# Patient Record
Sex: Female | Born: 1945 | Race: Black or African American | Hispanic: No | Smoking: Former smoker
Health system: Southern US, Community
[De-identification: ages and names within clinical notes are randomized; demographics above are authoritative.]

## PROBLEM LIST (undated history)

## (undated) DIAGNOSIS — M2550 Pain in unspecified joint: Secondary | ICD-10-CM

## (undated) DIAGNOSIS — J189 Pneumonia, unspecified organism: Secondary | ICD-10-CM

## (undated) DIAGNOSIS — IMO0001 Reserved for inherently not codable concepts without codable children: Secondary | ICD-10-CM

## (undated) DIAGNOSIS — I739 Peripheral vascular disease, unspecified: Secondary | ICD-10-CM

## (undated) DIAGNOSIS — Z531 Procedure and treatment not carried out because of patient's decision for reasons of belief and group pressure: Secondary | ICD-10-CM

## (undated) DIAGNOSIS — Z860100 Personal history of colon polyps, unspecified: Secondary | ICD-10-CM

## (undated) DIAGNOSIS — I639 Cerebral infarction, unspecified: Secondary | ICD-10-CM

## (undated) DIAGNOSIS — N186 End stage renal disease: Secondary | ICD-10-CM

## (undated) DIAGNOSIS — G629 Polyneuropathy, unspecified: Secondary | ICD-10-CM

## (undated) DIAGNOSIS — R42 Dizziness and giddiness: Secondary | ICD-10-CM

## (undated) DIAGNOSIS — E119 Type 2 diabetes mellitus without complications: Secondary | ICD-10-CM

## (undated) DIAGNOSIS — Z8739 Personal history of other diseases of the musculoskeletal system and connective tissue: Secondary | ICD-10-CM

## (undated) DIAGNOSIS — I10 Essential (primary) hypertension: Secondary | ICD-10-CM

## (undated) DIAGNOSIS — E785 Hyperlipidemia, unspecified: Secondary | ICD-10-CM

## (undated) DIAGNOSIS — M1990 Unspecified osteoarthritis, unspecified site: Secondary | ICD-10-CM

## (undated) DIAGNOSIS — R7303 Prediabetes: Secondary | ICD-10-CM

## (undated) HISTORY — DX: Cerebral infarction, unspecified: I63.9

## (undated) HISTORY — PX: APPENDECTOMY: SHX54

## (undated) HISTORY — PX: DILATION AND CURETTAGE OF UTERUS: SHX78

## (undated) HISTORY — PX: ESOPHAGOGASTRODUODENOSCOPY: SHX1529

## (undated) HISTORY — PX: COLONOSCOPY: SHX174

## (undated) HISTORY — PX: TUBAL LIGATION: SHX77

## (undated) HISTORY — PX: CYST EXCISION: SHX5701

## (undated) HISTORY — DX: Peripheral vascular disease, unspecified: I73.9

---

## 1984-07-05 HISTORY — PX: TUBAL LIGATION: SHX77

## 1985-07-05 HISTORY — PX: DILATION AND CURETTAGE OF UTERUS: SHX78

## 2001-11-07 ENCOUNTER — Emergency Department (HOSPITAL_COMMUNITY): Admission: EM | Admit: 2001-11-07 | Discharge: 2001-11-07 | Payer: Self-pay | Admitting: Emergency Medicine

## 2001-11-07 ENCOUNTER — Encounter: Payer: Self-pay | Admitting: *Deleted

## 2001-12-25 ENCOUNTER — Emergency Department (HOSPITAL_COMMUNITY): Admission: EM | Admit: 2001-12-25 | Discharge: 2001-12-25 | Payer: Self-pay | Admitting: Emergency Medicine

## 2007-01-23 ENCOUNTER — Encounter (INDEPENDENT_AMBULATORY_CARE_PROVIDER_SITE_OTHER): Payer: Self-pay | Admitting: Nurse Practitioner

## 2007-03-15 ENCOUNTER — Encounter (INDEPENDENT_AMBULATORY_CARE_PROVIDER_SITE_OTHER): Payer: Self-pay | Admitting: Nurse Practitioner

## 2007-03-15 ENCOUNTER — Ambulatory Visit: Payer: Self-pay | Admitting: Family Medicine

## 2007-03-15 DIAGNOSIS — E119 Type 2 diabetes mellitus without complications: Secondary | ICD-10-CM

## 2007-03-15 DIAGNOSIS — I10 Essential (primary) hypertension: Secondary | ICD-10-CM | POA: Insufficient documentation

## 2007-03-15 LAB — CONVERTED CEMR LAB
ALT: 9 units/L (ref 0–35)
AST: 13 units/L (ref 0–37)
Albumin: 3.6 g/dL (ref 3.5–5.2)
Alkaline Phosphatase: 97 units/L (ref 39–117)
BUN: 22 mg/dL (ref 6–23)
Basophils Absolute: 0 10*3/uL (ref 0.0–0.1)
Basophils Relative: 1 % (ref 0–1)
Bilirubin Urine: NEGATIVE
Blood Glucose, Fingerstick: 125
Blood in Urine, dipstick: NEGATIVE
CO2: 22 meq/L (ref 19–32)
Calcium: 8.5 mg/dL (ref 8.4–10.5)
Chloride: 104 meq/L (ref 96–112)
Creatinine, Ser: 1.85 mg/dL — ABNORMAL HIGH (ref 0.40–1.20)
Eosinophils Absolute: 0.4 10*3/uL (ref 0.0–0.7)
Eosinophils Relative: 5 % (ref 0–5)
Glucose, Bld: 104 mg/dL — ABNORMAL HIGH (ref 70–99)
Glucose, Urine, Semiquant: NEGATIVE
HCT: 36.7 % (ref 36.0–46.0)
Hemoglobin: 11.2 g/dL — ABNORMAL LOW (ref 12.0–15.0)
Hgb A1c MFr Bld: 5.8 %
Ketones, urine, test strip: NEGATIVE
Lymphocytes Relative: 32 % (ref 12–46)
Lymphs Abs: 2.7 10*3/uL (ref 0.7–3.3)
MCHC: 30.5 g/dL (ref 30.0–36.0)
MCV: 91.5 fL (ref 78.0–100.0)
Microalb, Ur: 191 mg/dL — ABNORMAL HIGH (ref 0.00–1.89)
Monocytes Absolute: 0.7 10*3/uL (ref 0.2–0.7)
Monocytes Relative: 8 % (ref 3–11)
Neutro Abs: 4.8 10*3/uL (ref 1.7–7.7)
Neutrophils Relative %: 55 % (ref 43–77)
Nitrite: NEGATIVE
Platelets: 356 10*3/uL (ref 150–400)
Potassium: 4.6 meq/L (ref 3.5–5.3)
Protein, U semiquant: >=300
RBC: 4.01 M/uL (ref 3.87–5.11)
RDW: 14.3 % — ABNORMAL HIGH (ref 11.5–14.0)
Sodium: 137 meq/L (ref 135–145)
Specific Gravity, Urine: 1.025
TSH: 1.266 microintl units/mL (ref 0.350–5.50)
Total Bilirubin: 0.2 mg/dL — ABNORMAL LOW (ref 0.3–1.2)
Total Protein: 8.2 g/dL (ref 6.0–8.3)
Urobilinogen, UA: 0.2
WBC: 8.6 10*3/uL (ref 4.0–10.5)
pH: 6

## 2007-04-27 ENCOUNTER — Ambulatory Visit: Payer: Self-pay | Admitting: Internal Medicine

## 2007-04-27 ENCOUNTER — Encounter (INDEPENDENT_AMBULATORY_CARE_PROVIDER_SITE_OTHER): Payer: Self-pay | Admitting: Internal Medicine

## 2007-04-27 ENCOUNTER — Encounter (INDEPENDENT_AMBULATORY_CARE_PROVIDER_SITE_OTHER): Payer: Self-pay | Admitting: Nurse Practitioner

## 2007-04-27 LAB — CONVERTED CEMR LAB
Bilirubin Urine: NEGATIVE
Blood Glucose, Fingerstick: 111
Blood in Urine, dipstick: NEGATIVE
Glucose, Urine, Semiquant: NEGATIVE
KOH Prep: NEGATIVE
Ketones, urine, test strip: NEGATIVE
Nitrite: NEGATIVE
Protein, U semiquant: NEGATIVE
Specific Gravity, Urine: 1.02
Urobilinogen, UA: 0.2
Whiff Test: NEGATIVE
pH: 6

## 2007-04-28 ENCOUNTER — Encounter (INDEPENDENT_AMBULATORY_CARE_PROVIDER_SITE_OTHER): Payer: Self-pay | Admitting: Nurse Practitioner

## 2007-05-14 ENCOUNTER — Encounter (INDEPENDENT_AMBULATORY_CARE_PROVIDER_SITE_OTHER): Payer: Self-pay | Admitting: Internal Medicine

## 2007-05-22 ENCOUNTER — Ambulatory Visit (HOSPITAL_COMMUNITY): Admission: RE | Admit: 2007-05-22 | Discharge: 2007-05-22 | Payer: Self-pay | Admitting: Endocrinology

## 2008-03-04 ENCOUNTER — Ambulatory Visit: Payer: Self-pay | Admitting: Nurse Practitioner

## 2008-03-04 DIAGNOSIS — J309 Allergic rhinitis, unspecified: Secondary | ICD-10-CM | POA: Insufficient documentation

## 2008-03-04 LAB — CONVERTED CEMR LAB
Blood Glucose, Fingerstick: 146
Hgb A1c MFr Bld: 8 %

## 2008-07-22 ENCOUNTER — Ambulatory Visit: Payer: Self-pay | Admitting: Nurse Practitioner

## 2008-07-22 LAB — CONVERTED CEMR LAB
Bilirubin Urine: NEGATIVE
Blood Glucose, Fingerstick: 115
Glucose, Urine, Semiquant: 100
Hgb A1c MFr Bld: 7.1 %
Ketones, urine, test strip: NEGATIVE
Nitrite: NEGATIVE
Protein, U semiquant: >=300
Specific Gravity, Urine: 1.015
Urobilinogen, UA: 0.2
WBC Urine, dipstick: NEGATIVE
pH: 6.5

## 2008-07-29 LAB — CONVERTED CEMR LAB
ALT: 10 units/L (ref 0–35)
AST: 12 units/L (ref 0–37)
Albumin: 2.8 g/dL — ABNORMAL LOW (ref 3.5–5.2)
Alkaline Phosphatase: 115 units/L (ref 39–117)
BUN: 37 mg/dL — ABNORMAL HIGH (ref 6–23)
Basophils Absolute: 0 10*3/uL (ref 0.0–0.1)
Basophils Relative: 1 % (ref 0–1)
CO2: 18 meq/L — ABNORMAL LOW (ref 19–32)
Calcium: 8.2 mg/dL — ABNORMAL LOW (ref 8.4–10.5)
Chloride: 110 meq/L (ref 96–112)
Cholesterol: 275 mg/dL — ABNORMAL HIGH (ref 0–200)
Creatinine, Ser: 4.04 mg/dL — ABNORMAL HIGH (ref 0.40–1.20)
Eosinophils Absolute: 0.4 10*3/uL (ref 0.0–0.7)
Eosinophils Relative: 6 % — ABNORMAL HIGH (ref 0–5)
Glucose, Bld: 101 mg/dL — ABNORMAL HIGH (ref 70–99)
HCT: 35.2 % — ABNORMAL LOW (ref 36.0–46.0)
HDL: 41 mg/dL (ref >39)
Hemoglobin: 10.2 g/dL — ABNORMAL LOW (ref 12.0–15.0)
LDL Cholesterol: 192 mg/dL — ABNORMAL HIGH (ref 0–99)
Lymphocytes Relative: 21 % (ref 12–46)
Lymphs Abs: 1.6 10*3/uL (ref 0.7–4.0)
MCHC: 29 g/dL — ABNORMAL LOW (ref 30.0–36.0)
MCV: 92.9 fL (ref 78.0–100.0)
Microalb, Ur: 397 mg/dL — ABNORMAL HIGH (ref 0.00–1.89)
Monocytes Absolute: 0.6 10*3/uL (ref 0.1–1.0)
Monocytes Relative: 8 % (ref 3–12)
Neutro Abs: 5 10*3/uL (ref 1.7–7.7)
Neutrophils Relative %: 65 % (ref 43–77)
Platelets: 367 10*3/uL (ref 150–400)
Potassium: 5.5 meq/L — ABNORMAL HIGH (ref 3.5–5.3)
RBC: 3.79 M/uL — ABNORMAL LOW (ref 3.87–5.11)
RDW: 14.6 % (ref 11.5–15.5)
Sodium: 137 meq/L (ref 135–145)
TSH: 2.203 microintl units/mL (ref 0.350–4.50)
Total Bilirubin: 0.2 mg/dL — ABNORMAL LOW (ref 0.3–1.2)
Total CHOL/HDL Ratio: 6.7
Total Protein: 6.7 g/dL (ref 6.0–8.3)
Triglycerides: 211 mg/dL — ABNORMAL HIGH (ref <150)
VLDL: 42 mg/dL — ABNORMAL HIGH (ref 0–40)
WBC: 7.6 10*3/uL (ref 4.0–10.5)

## 2008-08-07 ENCOUNTER — Ambulatory Visit: Payer: Self-pay | Admitting: Nurse Practitioner

## 2008-08-07 DIAGNOSIS — N259 Disorder resulting from impaired renal tubular function, unspecified: Secondary | ICD-10-CM | POA: Insufficient documentation

## 2008-08-07 DIAGNOSIS — E7800 Pure hypercholesterolemia, unspecified: Secondary | ICD-10-CM

## 2008-08-07 DIAGNOSIS — D649 Anemia, unspecified: Secondary | ICD-10-CM

## 2008-08-07 LAB — CONVERTED CEMR LAB
Blood Glucose, Fingerstick: 100
Cholesterol, target level: 200 mg/dL
HDL goal, serum: 40 mg/dL
LDL Goal: 100 mg/dL

## 2008-08-08 ENCOUNTER — Telehealth (INDEPENDENT_AMBULATORY_CARE_PROVIDER_SITE_OTHER): Payer: Self-pay | Admitting: Nurse Practitioner

## 2008-08-08 LAB — CONVERTED CEMR LAB
HCT: 35.4 % — ABNORMAL LOW (ref 36.0–46.0)
Hemoglobin: 10.7 g/dL — ABNORMAL LOW (ref 12.0–15.0)
MCHC: 30.2 g/dL (ref 30.0–36.0)
MCV: 92.2 fL (ref 78.0–100.0)
Platelets: 364 10*3/uL (ref 150–400)
RBC: 3.84 M/uL — ABNORMAL LOW (ref 3.87–5.11)
RDW: 14.6 % (ref 11.5–15.5)
Retic Ct Pct: 0.8 % (ref 0.4–3.1)
WBC: 9.1 10*3/uL (ref 4.0–10.5)

## 2008-08-12 ENCOUNTER — Ambulatory Visit: Payer: Self-pay | Admitting: Internal Medicine

## 2008-08-12 ENCOUNTER — Encounter (INDEPENDENT_AMBULATORY_CARE_PROVIDER_SITE_OTHER): Payer: Self-pay | Admitting: Nurse Practitioner

## 2008-08-13 LAB — CONVERTED CEMR LAB
Collection Interval-CRCL: 24 hr
Creatinine 24 HR UR: 1393 mg/24hr (ref 700–1800)
Creatinine Clearance: 25 mL/min — ABNORMAL LOW (ref 75–115)
Creatinine, Urine: 46.4 mg/dL
Protein, Ur: 10020 mg/24hr — ABNORMAL HIGH (ref 50–100)

## 2008-08-14 ENCOUNTER — Encounter (INDEPENDENT_AMBULATORY_CARE_PROVIDER_SITE_OTHER): Payer: Self-pay | Admitting: Nurse Practitioner

## 2008-08-21 ENCOUNTER — Telehealth (INDEPENDENT_AMBULATORY_CARE_PROVIDER_SITE_OTHER): Payer: Self-pay | Admitting: Nurse Practitioner

## 2008-09-04 ENCOUNTER — Encounter: Admission: RE | Admit: 2008-09-04 | Discharge: 2008-09-04 | Payer: Self-pay | Admitting: Nephrology

## 2008-09-05 ENCOUNTER — Encounter (INDEPENDENT_AMBULATORY_CARE_PROVIDER_SITE_OTHER): Payer: Self-pay | Admitting: Nurse Practitioner

## 2008-10-09 ENCOUNTER — Encounter (INDEPENDENT_AMBULATORY_CARE_PROVIDER_SITE_OTHER): Payer: Self-pay | Admitting: Nurse Practitioner

## 2008-11-11 ENCOUNTER — Encounter: Admission: RE | Admit: 2008-11-11 | Discharge: 2008-11-11 | Payer: Self-pay | Admitting: Nephrology

## 2009-02-05 ENCOUNTER — Encounter: Admission: RE | Admit: 2009-02-05 | Discharge: 2009-02-05 | Payer: Self-pay | Admitting: Psychiatry

## 2009-03-17 ENCOUNTER — Encounter (INDEPENDENT_AMBULATORY_CARE_PROVIDER_SITE_OTHER): Payer: Self-pay | Admitting: Nurse Practitioner

## 2009-04-14 ENCOUNTER — Encounter (HOSPITAL_COMMUNITY): Admission: RE | Admit: 2009-04-14 | Discharge: 2009-07-13 | Payer: Self-pay | Admitting: Nephrology

## 2009-05-07 ENCOUNTER — Ambulatory Visit: Payer: Self-pay | Admitting: Vascular Surgery

## 2009-05-22 ENCOUNTER — Ambulatory Visit (HOSPITAL_COMMUNITY): Admission: RE | Admit: 2009-05-22 | Discharge: 2009-05-22 | Payer: Self-pay | Admitting: Vascular Surgery

## 2009-05-22 ENCOUNTER — Ambulatory Visit: Payer: Self-pay | Admitting: Vascular Surgery

## 2009-06-20 ENCOUNTER — Encounter (INDEPENDENT_AMBULATORY_CARE_PROVIDER_SITE_OTHER): Payer: Self-pay | Admitting: Nurse Practitioner

## 2009-06-20 ENCOUNTER — Ambulatory Visit: Payer: Self-pay | Admitting: Vascular Surgery

## 2009-07-14 ENCOUNTER — Encounter (HOSPITAL_COMMUNITY): Admission: RE | Admit: 2009-07-14 | Discharge: 2009-10-12 | Payer: Self-pay | Admitting: Nephrology

## 2009-10-15 ENCOUNTER — Encounter (HOSPITAL_COMMUNITY): Admission: RE | Admit: 2009-10-15 | Discharge: 2010-01-13 | Payer: Self-pay | Admitting: Nephrology

## 2009-10-27 ENCOUNTER — Ambulatory Visit: Payer: Self-pay | Admitting: Surgery

## 2009-12-11 ENCOUNTER — Encounter (INDEPENDENT_AMBULATORY_CARE_PROVIDER_SITE_OTHER): Payer: Self-pay | Admitting: Nurse Practitioner

## 2009-12-23 ENCOUNTER — Ambulatory Visit (HOSPITAL_COMMUNITY): Admission: RE | Admit: 2009-12-23 | Discharge: 2009-12-23 | Payer: Self-pay | Admitting: Surgery

## 2009-12-23 ENCOUNTER — Ambulatory Visit: Payer: Self-pay | Admitting: Surgery

## 2010-01-12 ENCOUNTER — Ambulatory Visit: Payer: Self-pay | Admitting: Surgery

## 2010-01-21 ENCOUNTER — Encounter (HOSPITAL_COMMUNITY): Admission: RE | Admit: 2010-01-21 | Discharge: 2010-04-21 | Payer: Self-pay | Admitting: Nephrology

## 2010-02-03 ENCOUNTER — Ambulatory Visit: Payer: Self-pay | Admitting: Vascular Surgery

## 2010-02-03 ENCOUNTER — Ambulatory Visit (HOSPITAL_COMMUNITY): Admission: RE | Admit: 2010-02-03 | Discharge: 2010-02-03 | Payer: Self-pay | Admitting: Surgery

## 2010-03-10 ENCOUNTER — Ambulatory Visit: Payer: Self-pay | Admitting: Vascular Surgery

## 2010-04-24 ENCOUNTER — Encounter (HOSPITAL_COMMUNITY)
Admission: RE | Admit: 2010-04-24 | Discharge: 2010-07-23 | Payer: Self-pay | Source: Home / Self Care | Attending: Nephrology | Admitting: Nephrology

## 2010-07-20 LAB — IRON AND TIBC
Iron: 75 ug/dL (ref 42–135)
Saturation Ratios: 32 % (ref 20–55)
TIBC: 238 ug/dL — ABNORMAL LOW (ref 250–470)
UIBC: 163 ug/dL

## 2010-07-20 LAB — POCT HEMOGLOBIN-HEMACUE: Hemoglobin: 10.2 g/dL — ABNORMAL LOW (ref 12.0–15.0)

## 2010-07-20 LAB — FERRITIN: Ferritin: 380 ng/mL — ABNORMAL HIGH (ref 10–291)

## 2010-07-26 ENCOUNTER — Encounter: Payer: Self-pay | Admitting: Nephrology

## 2010-07-26 ENCOUNTER — Encounter: Payer: Self-pay | Admitting: Vascular Surgery

## 2010-07-27 ENCOUNTER — Encounter: Payer: Self-pay | Admitting: Nephrology

## 2010-07-31 ENCOUNTER — Encounter (HOSPITAL_COMMUNITY)
Admission: RE | Admit: 2010-07-31 | Discharge: 2010-08-04 | Payer: Self-pay | Source: Home / Self Care | Attending: Nephrology | Admitting: Nephrology

## 2010-08-02 LAB — CONVERTED CEMR LAB
Chlamydia, DNA Probe: NEGATIVE
Cholesterol: 217 mg/dL — ABNORMAL HIGH (ref 0–200)
GC Probe Amp, Genital: NEGATIVE
HDL: 33 mg/dL — ABNORMAL LOW (ref >39)
LDL Cholesterol: 138 mg/dL — ABNORMAL HIGH (ref 0–99)
Total CHOL/HDL Ratio: 6.6
Triglycerides: 231 mg/dL — ABNORMAL HIGH (ref <150)
VLDL: 46 mg/dL — ABNORMAL HIGH (ref 0–40)

## 2010-08-03 LAB — POCT HEMOGLOBIN-HEMACUE: Hemoglobin: 11 g/dL — ABNORMAL LOW (ref 12.0–15.0)

## 2010-08-06 NOTE — Letter (Signed)
Summary: [facility]  [facility]   Imported By: Roland Earl 01/14/2010 10:32:04  _____________________________________________________________________  External Attachment:    Type:   Image     Comment:   External Document

## 2010-08-06 NOTE — Letter (Signed)
Summary: [facility]  [facility]   Imported By: Roland Earl 08/13/2009 15:43:28  _____________________________________________________________________  External Attachment:    Type:   Image     Comment:   External Document

## 2010-08-10 ENCOUNTER — Encounter (INDEPENDENT_AMBULATORY_CARE_PROVIDER_SITE_OTHER): Payer: Self-pay | Admitting: Nurse Practitioner

## 2010-08-14 ENCOUNTER — Other Ambulatory Visit: Payer: Self-pay | Admitting: Nephrology

## 2010-08-14 ENCOUNTER — Other Ambulatory Visit: Payer: Self-pay

## 2010-08-14 ENCOUNTER — Encounter (HOSPITAL_COMMUNITY): Payer: BC Managed Care – PPO | Attending: Nephrology

## 2010-08-14 DIAGNOSIS — N184 Chronic kidney disease, stage 4 (severe): Secondary | ICD-10-CM | POA: Insufficient documentation

## 2010-08-14 DIAGNOSIS — D638 Anemia in other chronic diseases classified elsewhere: Secondary | ICD-10-CM | POA: Insufficient documentation

## 2010-08-14 LAB — IRON AND TIBC
Iron: 72 ug/dL (ref 42–135)
Saturation Ratios: 29 % (ref 20–55)
TIBC: 251 ug/dL (ref 250–470)
UIBC: 179 ug/dL

## 2010-08-14 LAB — POCT HEMOGLOBIN-HEMACUE: Hemoglobin: 11.6 g/dL — ABNORMAL LOW (ref 12.0–15.0)

## 2010-08-14 LAB — FERRITIN: Ferritin: 276 ng/mL (ref 10–291)

## 2010-08-19 ENCOUNTER — Other Ambulatory Visit (HOSPITAL_COMMUNITY): Payer: Self-pay | Admitting: Nephrology

## 2010-08-19 DIAGNOSIS — Z1231 Encounter for screening mammogram for malignant neoplasm of breast: Secondary | ICD-10-CM

## 2010-08-26 ENCOUNTER — Ambulatory Visit (HOSPITAL_COMMUNITY)
Admission: RE | Admit: 2010-08-26 | Discharge: 2010-08-26 | Disposition: A | Discharge status: Home / Self Care | Payer: BC Managed Care – PPO | Source: Ambulatory Visit | Attending: Nephrology | Admitting: Nephrology

## 2010-08-26 DIAGNOSIS — Z1231 Encounter for screening mammogram for malignant neoplasm of breast: Secondary | ICD-10-CM | POA: Insufficient documentation

## 2010-08-26 NOTE — Letter (Signed)
Summary: [facility]  [facility]   Imported By: Roland Earl 08/19/2010 13:59:33  _____________________________________________________________________  External Attachment:    Type:   Image     Comment:   External Document

## 2010-08-28 ENCOUNTER — Other Ambulatory Visit: Payer: Self-pay | Admitting: Nephrology

## 2010-08-28 DIAGNOSIS — R928 Other abnormal and inconclusive findings on diagnostic imaging of breast: Secondary | ICD-10-CM

## 2010-08-31 ENCOUNTER — Encounter (HOSPITAL_COMMUNITY): Payer: BC Managed Care – PPO

## 2010-09-07 ENCOUNTER — Encounter (HOSPITAL_COMMUNITY): Payer: BC Managed Care – PPO

## 2010-09-08 ENCOUNTER — Other Ambulatory Visit: Payer: Self-pay | Admitting: Gastroenterology

## 2010-09-14 ENCOUNTER — Ambulatory Visit
Admission: RE | Admit: 2010-09-14 | Discharge: 2010-09-14 | Disposition: A | Discharge status: Home / Self Care | Payer: BC Managed Care – PPO | Source: Ambulatory Visit | Attending: Nephrology | Admitting: Nephrology

## 2010-09-14 ENCOUNTER — Ambulatory Visit
Admission: RE | Admit: 2010-09-14 | Discharge: 2010-09-14 | Disposition: A | Discharge status: Home / Self Care | Payer: BC Managed Care – PPO | Source: Ambulatory Visit

## 2010-09-14 ENCOUNTER — Other Ambulatory Visit: Payer: Self-pay | Admitting: Nephrology

## 2010-09-14 DIAGNOSIS — N63 Unspecified lump in unspecified breast: Secondary | ICD-10-CM

## 2010-09-14 DIAGNOSIS — R928 Other abnormal and inconclusive findings on diagnostic imaging of breast: Secondary | ICD-10-CM

## 2010-09-14 DIAGNOSIS — N611 Abscess of the breast and nipple: Secondary | ICD-10-CM

## 2010-09-14 LAB — POCT HEMOGLOBIN-HEMACUE: Hemoglobin: 9.6 g/dL — ABNORMAL LOW (ref 12.0–15.0)

## 2010-09-15 LAB — IRON AND TIBC: UIBC: 157 ug/dL

## 2010-09-15 LAB — POCT HEMOGLOBIN-HEMACUE: Hemoglobin: 10.1 g/dL — ABNORMAL LOW (ref 12.0–15.0)

## 2010-09-16 ENCOUNTER — Other Ambulatory Visit: Payer: Self-pay | Admitting: Nephrology

## 2010-09-16 DIAGNOSIS — N63 Unspecified lump in unspecified breast: Secondary | ICD-10-CM

## 2010-09-16 LAB — IRON AND TIBC
Iron: 55 ug/dL (ref 42–135)
UIBC: 185 ug/dL

## 2010-09-16 LAB — POCT HEMOGLOBIN-HEMACUE: Hemoglobin: 9.8 g/dL — ABNORMAL LOW (ref 12.0–15.0)

## 2010-09-18 LAB — POCT HEMOGLOBIN-HEMACUE: Hemoglobin: 11.9 g/dL — ABNORMAL LOW (ref 12.0–15.0)

## 2010-09-18 LAB — POCT I-STAT 4, (NA,K, GLUC, HGB,HCT)
Hemoglobin: 14.3 g/dL (ref 12.0–15.0)
Sodium: 139 mEq/L (ref 135–145)

## 2010-09-18 LAB — FERRITIN: Ferritin: 441 ng/mL — ABNORMAL HIGH (ref 10–291)

## 2010-09-18 LAB — GLUCOSE, CAPILLARY: Glucose-Capillary: 115 mg/dL — ABNORMAL HIGH (ref 70–99)

## 2010-09-18 LAB — IRON AND TIBC: TIBC: 247 ug/dL — ABNORMAL LOW (ref 250–470)

## 2010-09-19 LAB — IRON AND TIBC
Saturation Ratios: 23 % (ref 20–55)
TIBC: 251 ug/dL (ref 250–470)

## 2010-09-20 LAB — IRON AND TIBC
Iron: 78 ug/dL (ref 42–135)
Saturation Ratios: 29 % (ref 20–55)
TIBC: 235 ug/dL — ABNORMAL LOW (ref 250–470)
UIBC: 181 ug/dL

## 2010-09-20 LAB — POCT I-STAT, CHEM 8
Calcium, Ion: 1.11 mmol/L — ABNORMAL LOW (ref 1.12–1.32)
Creatinine, Ser: 5.4 mg/dL — ABNORMAL HIGH (ref 0.4–1.2)
Glucose, Bld: 157 mg/dL — ABNORMAL HIGH (ref 70–99)
Hemoglobin: 11.6 g/dL — ABNORMAL LOW (ref 12.0–15.0)
Potassium: 4.5 mEq/L (ref 3.5–5.1)

## 2010-09-20 LAB — POCT HEMOGLOBIN-HEMACUE: Hemoglobin: 11.3 g/dL — ABNORMAL LOW (ref 12.0–15.0)

## 2010-09-20 LAB — FERRITIN: Ferritin: 580 ng/mL — ABNORMAL HIGH (ref 10–291)

## 2010-09-21 ENCOUNTER — Other Ambulatory Visit: Payer: Self-pay | Admitting: Nephrology

## 2010-09-21 ENCOUNTER — Encounter (HOSPITAL_COMMUNITY): Payer: BC Managed Care – PPO | Attending: Nephrology

## 2010-09-21 ENCOUNTER — Other Ambulatory Visit: Payer: Self-pay

## 2010-09-21 DIAGNOSIS — N184 Chronic kidney disease, stage 4 (severe): Secondary | ICD-10-CM | POA: Insufficient documentation

## 2010-09-21 DIAGNOSIS — D638 Anemia in other chronic diseases classified elsewhere: Secondary | ICD-10-CM | POA: Insufficient documentation

## 2010-09-21 LAB — IRON AND TIBC
Iron: 96 ug/dL (ref 42–135)
TIBC: 244 ug/dL — ABNORMAL LOW (ref 250–470)
UIBC: 143 ug/dL

## 2010-09-21 LAB — POCT HEMOGLOBIN-HEMACUE: Hemoglobin: 11.4 g/dL — ABNORMAL LOW (ref 12.0–15.0)

## 2010-09-22 LAB — POCT HEMOGLOBIN-HEMACUE: Hemoglobin: 11.1 g/dL — ABNORMAL LOW (ref 12.0–15.0)

## 2010-09-23 ENCOUNTER — Encounter: Payer: BC Managed Care – PPO | Admitting: Physician Assistant

## 2010-09-23 ENCOUNTER — Other Ambulatory Visit: Payer: Self-pay | Admitting: Physician Assistant

## 2010-09-23 ENCOUNTER — Other Ambulatory Visit (HOSPITAL_COMMUNITY)
Admission: RE | Admit: 2010-09-23 | Discharge: 2010-09-23 | Disposition: A | Discharge status: Home / Self Care | Payer: BC Managed Care – PPO | Source: Ambulatory Visit | Attending: Physician Assistant | Admitting: Physician Assistant

## 2010-09-23 DIAGNOSIS — Z01419 Encounter for gynecological examination (general) (routine) without abnormal findings: Secondary | ICD-10-CM

## 2010-09-23 DIAGNOSIS — Z124 Encounter for screening for malignant neoplasm of cervix: Secondary | ICD-10-CM | POA: Insufficient documentation

## 2010-09-23 LAB — POCT HEMOGLOBIN-HEMACUE
Hemoglobin: 11 g/dL — ABNORMAL LOW (ref 12.0–15.0)
Hemoglobin: 12.5 g/dL (ref 12.0–15.0)

## 2010-09-23 LAB — IRON AND TIBC
Saturation Ratios: 17 % — ABNORMAL LOW (ref 20–55)
TIBC: 253 ug/dL (ref 250–470)
TIBC: 247 ug/dL — ABNORMAL LOW (ref 250–470)

## 2010-09-25 NOTE — Progress Notes (Signed)
Joanne Carlson, Joanne Carlson              ACCOUNT NO.:  [id number]  MEDICAL RECORD NO.:  [id number]           PATIENT TYPE:  A  LOCATION:  [facility]                   FACILITY:  [facility]  PHYSICIAN:  Malcolm Metro, CNM    DATE OF BIRTH:  May 31, 1946  DATE OF SERVICE:  09/23/2010                                 CLINIC NOTE  The patient is being seen in [facility] [place] VISIT:  Pap smear without complaints.  HISTORY OF PRESENT ILLNESS:  The patient's problem list include; 1. Kidney disease, awaiting transplant 2. Hypertension, on medications. 3. Diabetes, managed with glipizide. 4. Hyperlipidemia.  The patient is currently awaiting kidney transplant.  Her care is being managed by the [facility] in [place].  She presents today for a Pap smear secondary to recommendation of [facility] as preop screening.  MEDICAL HISTORY:  She has no known drug allergies.  CURRENT MEDICATIONS:  Furosemide, metoprolol, Pravastatin, methocarbamol, glipizide, PhosLo 667.  The patient's last mammogram was on February 22, at which time there was an abnormal left subareolar mass.  A digital diagnostic mammogram and left breast ultrasound was performed on March 12 which found a 1 cm complicated cyst, subsequently ultrasound-guided aspiration was performed and the size of the abscess decreased in size postaspiration. The patient was started on Keflex which she continues to take today. BIRADS category 3, probable benign findings and she is to follow up next week with the breast center.  The patient is postmenopausal.  Her last period was in 1986.  CONTRACEPTIVE HISTORY:  She had a tubal ligation in 1996.  OBSTETRICAL HISTORY:  She is a gravida 3, para 2-0-1-2, they were vaginal deliveries without complication.  GYNECOLOGIC HISTORY:  Her last Pap smear was in 2009, at [facility] and it was found to be normal.  STD history is negative.  Personal  medical history is positive for as previously discussed kidney disease, hyperlipidemia, hypertension, diabetes.  Surgical history includes appendectomy more than 40 years ago, a cystectomy of her right ovary more than 20 years ago and __________  in 1986.  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  She denies any illicit drug use, smoking or alcohol.  FAMILY HISTORY:  Positive for diabetes in her mother, father and sister.  REVIEW OF SYSTEMS:  Systemic review is negative x10 other than what has been reviewed in HPI.  PHYSICAL EXAMINATION:  GENERAL:  Joanne Carlson is a pleasant 65 year old African American female in no apparent distress. HEENT:  Grossly normal. BREASTS:  Symmetrical, soft and nontender.  No retracting dimpling of the skin. VITAL SIGNS:  Temperature 97.3, 85 pulse, blood pressure is 168/78, recheck is 159/71, her weight today is 220 pounds and her height is 64 inches. ABDOMEN:  Obese and soft, nontender.  No hepatosplenomegaly.  She does have a vertical scar noted at the umbilicus. PELVIC:  Genitalia, she is a tanner V.  External genitalia are without lesions.  Mucous membranes are pink without lesions, hypertrophic tissues is nonfriable.  She has a lax tone.  No evidence of cystocele or rectocele.  Parous cervix is easily visualized with a Graves speculum. Cervix is nonfriable, smooth without lesions.  Bimanual exam is limited by patient habitus.  However, no enlargement or tenderness are appreciated of the uterus or adnexa bilaterally during examination. RECTAL:  Deferred. EXTREMITIES:  Equal in strength.  Lower extremities have equal pedal pulses.  No evidence of edema.  ASSESSMENT: 1. Well-woman exam with Pap smear. 2. Hypertension. 3. Diabetes. 4. Hyperlipidemia. 5. Kidney disease. 6. Recent abnormal mammogram with breast abscess being treated by     breast center.  PLAN: 1. Pap smear is obtained and sent per  routine to pathology.  The     patient will be informed by letter of normal Pap smear given the     patient's age of 65 and a history of no known abnormal Pap smears.     This will be her last needed Pap smear unless today is abnormal. 2. Health care maintenance.  The patient desires to receive her     hepatitis B series in preparation for a kidney disease and that has     been initiated today. 3. The patient is instructed to follow up with [facility] and     continue all medications as prescribed by them for her     hypertension, diabetes, hyperlipidemia and kidney disease. 4. The patient should follow up with the breast center as directed     next week for the results of her needle aspiration biopsy and     continue her full course of Keflex.  The patient should follow up     for annual exam in 1 year or p.r.n. problems.          ______________________________ Malcolm Metro, CNM    SS/MEDQ  D:  09/23/2010  T:  09/24/2010  Job:  [id number]

## 2010-09-28 ENCOUNTER — Other Ambulatory Visit: Payer: BC Managed Care – PPO

## 2010-09-28 LAB — IRON AND TIBC
Iron: 77 ug/dL (ref 42–135)
Saturation Ratios: 30 % (ref 20–55)
UIBC: 183 ug/dL

## 2010-09-30 ENCOUNTER — Encounter (HOSPITAL_COMMUNITY): Payer: BC Managed Care – PPO

## 2010-10-06 LAB — FERRITIN: Ferritin: 113 ng/mL (ref 10–291)

## 2010-10-06 LAB — POCT HEMOGLOBIN-HEMACUE: Hemoglobin: 10 g/dL — ABNORMAL LOW (ref 12.0–15.0)

## 2010-10-06 LAB — IRON AND TIBC
Saturation Ratios: 18 % — ABNORMAL LOW (ref 20–55)
TIBC: 271 ug/dL (ref 250–470)
UIBC: 222 ug/dL

## 2010-10-07 LAB — IRON AND TIBC
Saturation Ratios: 23 % (ref 20–55)
TIBC: 261 ug/dL (ref 250–470)

## 2010-10-07 LAB — GLUCOSE, CAPILLARY
Glucose-Capillary: 49 mg/dL — ABNORMAL LOW (ref 70–99)
Glucose-Capillary: 65 mg/dL — ABNORMAL LOW (ref 70–99)

## 2010-10-07 LAB — FERRITIN: Ferritin: 140 ng/mL (ref 10–291)

## 2010-10-07 LAB — POCT HEMOGLOBIN-HEMACUE: Hemoglobin: 10 g/dL — ABNORMAL LOW (ref 12.0–15.0)

## 2010-10-07 LAB — POCT I-STAT 4, (NA,K, GLUC, HGB,HCT)
HCT: 37 % (ref 36.0–46.0)
Hemoglobin: 12.6 g/dL (ref 12.0–15.0)

## 2010-10-12 ENCOUNTER — Encounter (HOSPITAL_COMMUNITY): Payer: BC Managed Care – PPO

## 2010-10-23 ENCOUNTER — Ambulatory Visit: Payer: BC Managed Care – PPO

## 2010-10-26 ENCOUNTER — Ambulatory Visit: Payer: BC Managed Care – PPO

## 2010-10-26 DIAGNOSIS — Z23 Encounter for immunization: Secondary | ICD-10-CM

## 2010-10-27 ENCOUNTER — Ambulatory Visit (INDEPENDENT_AMBULATORY_CARE_PROVIDER_SITE_OTHER): Payer: BC Managed Care – PPO | Admitting: Vascular Surgery

## 2010-10-27 DIAGNOSIS — N186 End stage renal disease: Secondary | ICD-10-CM

## 2010-10-28 NOTE — Assessment & Plan Note (Signed)
OFFICE VISIT  Joanne Carlson, Joanne Carlson DOB:  [birth year]                                       10/27/2010 [id number]  Patient presents today for followup of her left arm basilic vein transposition, and this was done by myself as a single-stage operation on 02/03/10.  She has had very nice maturation of her basilic vein transposition.  She reports that she is on the transplant list and is being evaluated at [facility].  She has no pain associated with her fistula.  PHYSICAL EXAMINATION:  On physical exam, blood pressure today is 132/65, pulse 76, respirations 18.  Her fistula has an excellent thrill throughout its course.  She does on compression in the axillae have very good size throughout.  I imaged this with the SonoSite, and she does not have any evidence of any stricture or severe stenosis.  The fistula is large at the antecubital space.  It is __________ smaller segment above the antecubital space and is quite large again.  I feel that this would be available at any time on imaging.  It does look to be relatively thin- walled and is typical of basilic veins, which may have trouble with infiltration, which is also typical with basilic veins.  She has been 9 months out, so I feel that if she needs hemodialysis access, I should be available at any time.    Rosetta Posner, M.D. Electronically Signed  TFE/MEDQ  D:  10/27/2010  T:  10/28/2010  Job:  [id number]  cc:   Jeneen Rinks L. Deterding, M.D.

## 2010-11-17 NOTE — Assessment & Plan Note (Signed)
OFFICE VISIT   Joanne Carlson, Joanne Carlson  DOB:  10/02/1945                                       10/27/2009  [id number]   REASON FOR VISIT:  Evaluate nonfunctioning fistula.   HISTORY:  This is Joanne Carlson 65 year old female who is status post left  radiocephalic fistula placed by Dr. Donnetta Hutching on May 22, 2009.  She  comes back today because this has not matured.  She is not yet on  dialysis.  The patient's renal failure is secondary to hypertension and  diabetes, both of which are medically managed with no acute changes.   REVIEW OF SYSTEMS:  CARDIAC:  Positive for heart murmur.  GU:  Positive for renal disease.  VASCULAR:  Positive for pain in her knee with walking.  NEUROLOGIC:  Positive for dizziness and headaches.  EENT:  Positive for nosebleeds.  All other review of systems is negative as documented in the encounter  form.   SOCIAL HISTORY:  Joanne Carlson has 2 children.  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  Quit smoking in 1975.  Does not drink.   PHYSICAL EXAMINATION:  Heart rate 79, blood pressure 116/65 temperature  is 98.0.  in general, she is well-appearing, in no distress.  HEENT:  Within normal limits.  Lungs are clear bilaterally.  Cardiovascular is Joanne Carlson  regular rate and rhythm.  No murmur.  No carotid bruits.  Abdomen is  obese.  Musculoskeletal:  Without major deformity.  Neuro:  She has no  focal weakness.  Skin:  Without rash.  Left arm:  There is Joanne Carlson minimal  thrill within the fistula.  I cannot palpate Joanne Carlson radial pulse.  She does  have Joanne Carlson palpable brachial pulse.  Motor and sensory function of the left  hand are intact.   DIAGNOSTIC STUDIES:  Vein mapping was independently reviewed.  This  reveals abnormally low flow within the left radial artery, as low as 13  cm/sec.  The fistula is patent but very small in the distal forearm.   ASSESSMENT/PLAN:  Non-maturing left arm fistula.   PLAN:  Based on the ultrasound findings, I am concerned there could be  arterial inflow problem within the radial artery.  I think this best  needs to be evaluated for with an arteriogram to see if placing Joanne Carlson  brachiocephalic fistula would put her at higher risk for steal syndrome  and see how well her hand is perfused.  Also, if they is what looks like  more of an anatomic problem, I would image her right arm to make sure  this same problem does not happen on the right side.  The existing  fistula most likely will not survive and may need to be converted to an  upper arm fistula.  The timing of this will be based on findings of her  arteriogram, which has been scheduled for Tuesday, the 3rd.     Eldridge Abrahams, MD  Electronically Signed   VWB/MEDQ  D:  10/27/2009  T:  10/28/2009  Job:  [id number]   cc:   Dr. Jimmy Footman

## 2010-11-17 NOTE — Procedures (Signed)
CEPHALIC VEIN MAPPING   INDICATION:  AV fistula placement.   HISTORY:  Renal failure, hypertension, diabetes.   EXAM:  The right cephalic vein is compressible.   Diameter measurements range from 0.23 to 0.35 cm.   The left cephalic vein is compressible.   Diameter measurements range from 0.23 to 0.38 cm.   See attached worksheet for all measurements.   IMPRESSION:  Patent has bilateral cephalic veins which are of acceptable  diameter for use as dialysis access sites.   ___________________________________________  Jessy Oto Fields, MD   CB/MEDQ  D:  05/07/2009  T:  05/07/2009  Job:  [id number]

## 2010-11-17 NOTE — Assessment & Plan Note (Signed)
OFFICE VISIT   KESIAH, SPITZLEY  DOB:  05/19/1946                                       01/12/2010  [id number]   REASON FOR VISIT:  Follow up nonmaturing fistula.   HISTORY:  This is a 65 year old female who underwent radiocephalic  fistula on the left by Dr. Donnetta Hutching in November 2010.  This has not  matured.  Ultrasound revealed significantly decreased pressure within  the left radial artery.  I took her for arteriogram.  She had an  occluded left radial artery.  I did not see any other stenosis within  her axillary or brachial arteries.   PHYSICAL EXAMINATION:  Heart rate is 79, blood pressure 118/69,  temperature is 97.8.  General:  She is well-appearing in no distress.  Lungs are clear bilaterally.  Cardiovascular:  Regular rate and rhythm.  Left arm reveals a palpable left brachial pulse.  There is minimal  thrill within her fistula.   ASSESSMENT/PLAN:  Nonmaturing left arm fistula.   PLAN:  I have discussed proceeding with left upper arm fistula.  We  discussed the risk of steal syndrome.  I think that this,  however,  should provide adequate access for her.  Her operation has been  scheduled for Tuesday August 2, as the patient finishes working on July  30th and wants to wait until she is done.     Eldridge Abrahams, MD  Electronically Signed   VWB/MEDQ  D:  01/12/2010  T:  01/13/2010  Job:  [id number]   cc:   Jeneen Rinks L. Deterding, M.D.

## 2010-11-17 NOTE — Assessment & Plan Note (Signed)
OFFICE VISIT   [name], [name]  DOB:  1945-07-29                                       03/10/2010  [id number]   Joanne Carlson presents today for follow-up of basilic vein  transposition on February 03, 2010 at [facility] left upper arm.  She has a very nice maturation at 1 month.  She has excellent size, and  excellent superficial tunneling of this fistula.  She is not on dialysis  currently.  She will continue her usual activities.  She is allowed to  return to work without any heavy lifting, and we will see her again on  an as-needed basis.  She will continue her follow-up with Dr. Jimmy Footman.     Rosetta Posner, M.D.  Electronically Signed   TFE/MEDQ  D:  03/10/2010  T:  03/11/2010  Job:  [id number]   cc:   Jeneen Rinks L. Deterding, M.D.

## 2010-11-17 NOTE — Assessment & Plan Note (Signed)
OFFICE VISIT   Joanne Carlson, Joanne Carlson  DOB:  06/14/1946                                       06/20/2009  [id number]   ATTENDING PHYSICIAN:  Rosetta Posner, M.D.  Date of surgery was  05/22/2009.   CHIEF COMPLAINT:  Status post left Cimino fistula.   HISTORY OF PRESENT ILLNESS:  The patient is a 65 year old female with a  history of hypertension and diabetes who had a left arm Cimino fistula  placed in anticipation of hemodialysis.  She is not presently on  dialysis.  The fistula was placed on 05/22/2009.  She is healing well.  She are no complaints of steal.  There is no pain in the hand.  She can  move it well.  She can use her hand normally.  Her hand has been warm  and pink without complaint.   REVIEW OF SYSTEMS:  Her hypertension and diabetes are well-controlled  with pain medication.   PHYSICAL EXAM:  Vital signs:  Heart rate is 82, O2 sats are 96,  temperature was 98.6 and respiratory rate is 12.  Left upper extremity:  Hand is warm and pink.  She had good sensation and motion of the left  hand.  She can make a full fist.  She did have equal strength  bilaterally in her upper extremities and good grip.  She had 2+ radial  and ulnar pulses beyond the fistula incision.  She had a good thrill and  bruit up to the antecubital space on the left.  The thrill went up to  about mid forearm.   IMPRESSION:  A patient with end-stage renal disease, hypertension and  diabetes but is not on hemodialysis as of yet.  She is status post a  left arm Cimino fistula with no signs of steal and good function of the  fistula.   PLAN:  1. The patient will follow up with her nephrologist for lab work.  The      fistula should be ready to use in 2 more months.  2. The patient will be doing some exercises of her hand to build up      her fistula and she will call if there are any issues with      drainage, redness, swelling, pain, numbness or tingling in her  hand.   Wray Kearns, PA-C   Rosetta Posner, M.D.  Electronically Signed   RR/MEDQ  D:  06/20/2009  T:  06/21/2009  Job:  [id number]

## 2010-11-17 NOTE — Assessment & Plan Note (Signed)
OFFICE VISIT   VERANDA, VEDDER  DOB:  Apr 28, 1946                                       05/07/2009  [id number]   The patient is a 65 year old female referred today for placement of a  hemodialysis access.  She is right-handed.  She is currently not on  dialysis.  Chronic medical problems include diabetes and hypertension  which are thought to be the cause of her renal failure.  She also has a  history of anemia, hyperlipidemia, hyperparathyroidism, obesity and  chronic back pain.  She has had no prior hemodialysis access procedures.   PAST MEDICAL HISTORY:  Is as mentioned above.   FAMILY HISTORY:  Unremarkable.   SOCIAL HISTORY:  She is married, has 2 children.  Currently works as a  Scientist, water quality.  She is a former smoker, quit in 1978.  She does not consume  alcohol regularly.   REVIEW OF SYSTEMS:  Full review of systems was performed today.  This  was remarkable primarily for her kidney disease.  Please see intake  referral form for full details.   PHYSICAL EXAM:  Vital signs:  Blood pressure is 116/74 in the left arm,  temperature is 98.2, heart rate 72 and regular.  HEENT:  Unremarkable.  General:  She is alert and oriented x3.  Neck:  Has no carotid bruits.  Chest:  Clear to auscultation.  Cardiac:  Exam is regular rate and  rhythm.  Abdomen:  Soft, obese, nontender, nondistended.  No masses.  Extremities:  She has 2+ brachial and radial pulses bilaterally.  Neurological:  Shows symmetric upper extremity and lower extremity motor  strength which is 5/5.  Skin:  Has no rashes or lesions.  Placement of a  tourniquet on the left arm shows a vaguely palpable cephalic vein at the  wrist.  Vein mapping ultrasound was performed today.  The vein on the  left arm was between 30 and 41 mm in the forearm, between 23 and 33 mm  in the left upper arm.   I believe that the best option for the patient would be placement of a  left radiocephalic AV fistula.   Risks, benefits, possible complications  and procedure details including but not limited to bleeding, infection,  nonmaturation of the fistula were explained to the patient today.  She  understands and agrees to proceed.  This will be scheduled for her in  the near future.   Jessy Oto. Fields, MD  Electronically Signed   CEF/MEDQ  D:  05/08/2009  T:  05/08/2009  Job:  [id number]   cc:   Jeneen Rinks L. Deterding, M.D.

## 2010-12-18 ENCOUNTER — Other Ambulatory Visit: Payer: Self-pay | Admitting: Nephrology

## 2010-12-18 ENCOUNTER — Encounter (HOSPITAL_COMMUNITY)
Admission: RE | Admit: 2010-12-18 | Discharge: 2010-12-18 | Disposition: A | Discharge status: Home / Self Care | Payer: BC Managed Care – PPO | Source: Ambulatory Visit | Attending: Nephrology | Admitting: Nephrology

## 2010-12-18 DIAGNOSIS — N184 Chronic kidney disease, stage 4 (severe): Secondary | ICD-10-CM | POA: Insufficient documentation

## 2010-12-18 DIAGNOSIS — D638 Anemia in other chronic diseases classified elsewhere: Secondary | ICD-10-CM | POA: Insufficient documentation

## 2010-12-21 ENCOUNTER — Ambulatory Visit
Admission: RE | Admit: 2010-12-21 | Discharge: 2010-12-21 | Disposition: A | Discharge status: Home / Self Care | Payer: BC Managed Care – PPO | Source: Ambulatory Visit | Attending: Nephrology | Admitting: Nephrology

## 2010-12-21 DIAGNOSIS — N611 Abscess of the breast and nipple: Secondary | ICD-10-CM

## 2011-01-04 ENCOUNTER — Encounter (HOSPITAL_COMMUNITY): Payer: BC Managed Care – PPO

## 2011-01-08 ENCOUNTER — Encounter (HOSPITAL_COMMUNITY): Payer: BC Managed Care – PPO

## 2011-01-11 ENCOUNTER — Other Ambulatory Visit: Payer: Self-pay | Admitting: Nephrology

## 2011-01-11 ENCOUNTER — Encounter (HOSPITAL_COMMUNITY): Payer: BC Managed Care – PPO | Attending: Nephrology

## 2011-01-11 DIAGNOSIS — N184 Chronic kidney disease, stage 4 (severe): Secondary | ICD-10-CM | POA: Insufficient documentation

## 2011-01-11 DIAGNOSIS — D638 Anemia in other chronic diseases classified elsewhere: Secondary | ICD-10-CM | POA: Insufficient documentation

## 2011-01-11 LAB — FERRITIN: Ferritin: 468 ng/mL — ABNORMAL HIGH (ref 10–291)

## 2011-01-11 LAB — IRON AND TIBC
Saturation Ratios: 30 % (ref 20–55)
UIBC: 179 ug/dL

## 2011-01-25 ENCOUNTER — Encounter (HOSPITAL_COMMUNITY): Payer: BC Managed Care – PPO

## 2011-01-25 ENCOUNTER — Other Ambulatory Visit: Payer: Self-pay | Admitting: Nephrology

## 2011-01-25 LAB — POCT HEMOGLOBIN-HEMACUE: Hemoglobin: 10.4 g/dL — ABNORMAL LOW (ref 12.0–15.0)

## 2011-02-11 ENCOUNTER — Other Ambulatory Visit: Payer: Self-pay | Admitting: Nephrology

## 2011-02-11 ENCOUNTER — Encounter (HOSPITAL_COMMUNITY): Payer: BC Managed Care – PPO | Attending: Nephrology

## 2011-02-11 DIAGNOSIS — N184 Chronic kidney disease, stage 4 (severe): Secondary | ICD-10-CM | POA: Insufficient documentation

## 2011-02-11 DIAGNOSIS — D638 Anemia in other chronic diseases classified elsewhere: Secondary | ICD-10-CM | POA: Insufficient documentation

## 2011-02-11 LAB — POCT HEMOGLOBIN-HEMACUE: Hemoglobin: 11.1 g/dL — ABNORMAL LOW (ref 12.0–15.0)

## 2011-02-25 ENCOUNTER — Encounter (HOSPITAL_COMMUNITY): Payer: BC Managed Care – PPO

## 2011-03-03 ENCOUNTER — Encounter (HOSPITAL_COMMUNITY): Payer: BC Managed Care – PPO

## 2011-05-24 ENCOUNTER — Telehealth: Payer: Self-pay | Admitting: *Deleted

## 2011-05-24 NOTE — Telephone Encounter (Signed)
Pt called stating wanting call back. Telephoned pt at home #. Pt stated needed results of mammogram, pap smear and copy of hep B injections faxed to [place] pt would need to do release of information over the phone and Estill Bamberg Rash was witness. Pt gave fax # as [phone]. Advised pt would fax info today.

## 2011-05-31 ENCOUNTER — Other Ambulatory Visit (HOSPITAL_COMMUNITY): Payer: Self-pay | Admitting: *Deleted

## 2011-06-03 ENCOUNTER — Encounter (HOSPITAL_COMMUNITY)
Admission: RE | Admit: 2011-06-03 | Discharge: 2011-06-03 | Disposition: A | Discharge status: Home / Self Care | Payer: BC Managed Care – PPO | Source: Ambulatory Visit | Attending: Nephrology | Admitting: Nephrology

## 2011-06-03 DIAGNOSIS — N184 Chronic kidney disease, stage 4 (severe): Secondary | ICD-10-CM | POA: Insufficient documentation

## 2011-06-03 DIAGNOSIS — D638 Anemia in other chronic diseases classified elsewhere: Secondary | ICD-10-CM | POA: Insufficient documentation

## 2011-06-03 LAB — IRON AND TIBC
Saturation Ratios: 26 % (ref 20–55)
TIBC: 250 ug/dL (ref 250–470)

## 2011-06-03 LAB — POCT HEMOGLOBIN-HEMACUE: Hemoglobin: 9.4 g/dL — ABNORMAL LOW (ref 12.0–15.0)

## 2011-06-03 LAB — FERRITIN: Ferritin: 545 ng/mL — ABNORMAL HIGH (ref 10–291)

## 2011-06-03 MED ORDER — EPOETIN ALFA 10000 UNIT/ML IJ SOLN: 40000.0000 [IU] | INTRAMUSCULAR | Status: DC

## 2011-06-03 MED ORDER — EPOETIN ALFA 40000 UNIT/ML IJ SOLN
INTRAMUSCULAR | Status: AC
  Administered 2011-06-03: 40000 [IU] via SUBCUTANEOUS
  Filled 2011-06-03: qty 1

## 2011-06-03 NOTE — Progress Notes (Signed)
Crystal at [facility] aware that pt has not been here since 02/11/11

## 2011-06-16 ENCOUNTER — Encounter (HOSPITAL_COMMUNITY)
Admission: RE | Admit: 2011-06-16 | Discharge: 2011-06-16 | Disposition: A | Discharge status: Home / Self Care | Payer: BC Managed Care – PPO | Source: Ambulatory Visit | Attending: Nephrology | Admitting: Nephrology

## 2011-06-16 DIAGNOSIS — D638 Anemia in other chronic diseases classified elsewhere: Secondary | ICD-10-CM | POA: Insufficient documentation

## 2011-06-16 DIAGNOSIS — N184 Chronic kidney disease, stage 4 (severe): Secondary | ICD-10-CM | POA: Insufficient documentation

## 2011-06-16 MED ORDER — EPOETIN ALFA 10000 UNIT/ML IJ SOLN
40000.0000 [IU] | INTRAMUSCULAR | Status: DC
  Administered 2011-06-16: 40000 [IU] via SUBCUTANEOUS

## 2011-06-16 MED ORDER — EPOETIN ALFA 40000 UNIT/ML IJ SOLN
INTRAMUSCULAR | Status: AC
  Filled 2011-06-16: qty 1

## 2011-06-30 ENCOUNTER — Encounter (HOSPITAL_COMMUNITY): Payer: BC Managed Care – PPO

## 2011-07-01 ENCOUNTER — Telehealth: Payer: Self-pay | Admitting: *Deleted

## 2011-07-01 NOTE — Telephone Encounter (Signed)
Call received from [place] @ the kidney transplant dept of [facility]. She states that they are in need of Gyn clearance for pt to receive kidney transplant surgery due to a nodule that was found in her breast. She can be reached @ [phone].   * Note: pt was seen by Malcolm Metro in 09/2010 for routine Gyn exam.  Call info routed to Dr. Harolyn Rutherford for review as Vinnie Level is not available.

## 2011-07-02 ENCOUNTER — Encounter: Payer: Self-pay | Admitting: Obstetrics & Gynecology

## 2011-07-02 NOTE — Telephone Encounter (Signed)
Talked to [place] this morning, and I need to send a letter to Dr. Corine Shelter Fax  [phone] saying patient is cleared from a GYN standpoint.  Letter will be sent later today.  Last imaging was in 12/21/2010, impression was of a likely benign abscess in her left breast that needs follow up.    Interval decrease in size of probable resolving left retroareolar abscess or other benign appearing fluid collection. A solid component is not definitively excluded, however, and 61-month ultrasound is recommended to establish resolution. Findings and recommendations discussed with the patient and provided in written form at the time of the exam. The patient is counseled to contact us if there is any change in the meantime.  Patient should be called to follow up with Breast Center, but she is cleared to receive kidney transplant surgery from a GYN standpoint.

## 2011-07-05 ENCOUNTER — Encounter: Payer: Self-pay | Admitting: Obstetrics & Gynecology

## 2011-07-06 HISTORY — PX: SHOULDER ARTHROSCOPY W/ ROTATOR CUFF REPAIR: SHX2400

## 2011-07-12 ENCOUNTER — Other Ambulatory Visit (HOSPITAL_COMMUNITY): Payer: Self-pay | Admitting: *Deleted

## 2011-07-14 ENCOUNTER — Telehealth: Payer: Self-pay | Admitting: *Deleted

## 2011-07-14 ENCOUNTER — Encounter (HOSPITAL_COMMUNITY)
Admission: RE | Admit: 2011-07-14 | Discharge: 2011-07-14 | Disposition: A | Discharge status: Home / Self Care | Payer: BC Managed Care – PPO | Source: Ambulatory Visit | Attending: Nephrology | Admitting: Nephrology

## 2011-07-14 DIAGNOSIS — N184 Chronic kidney disease, stage 4 (severe): Secondary | ICD-10-CM | POA: Insufficient documentation

## 2011-07-14 DIAGNOSIS — D638 Anemia in other chronic diseases classified elsewhere: Secondary | ICD-10-CM | POA: Diagnosis not present

## 2011-07-14 LAB — IRON AND TIBC
Saturation Ratios: 26 % (ref 20–55)
UIBC: 172 ug/dL (ref 125–400)

## 2011-07-14 LAB — POCT HEMOGLOBIN-HEMACUE: Hemoglobin: 10.3 g/dL — ABNORMAL LOW (ref 12.0–15.0)

## 2011-07-14 MED ORDER — EPOETIN ALFA 10000 UNIT/ML IJ SOLN: 40000.0000 [IU] | INTRAMUSCULAR | Status: DC

## 2011-07-14 MED ORDER — EPOETIN ALFA 40000 UNIT/ML IJ SOLN
INTRAMUSCULAR | Status: AC
  Administered 2011-07-14: 40000 [IU] via SUBCUTANEOUS
  Filled 2011-07-14: qty 1

## 2011-07-14 NOTE — Telephone Encounter (Signed)
Pt left message of wanting to know if x-rays had been faxed to the medical center in [facility]. I called pt for further clarification. She states that she had called here some time ago and requested some test results to be faxed to [facility]. I reviewd chart info and told pt that there is a note dated Nov.19 which states that the requested results were faxed. I also told pt that Dr. Harolyn Rutherford had sent a letter on 07/05/11 which stated that she is cleared for surgery from a Gyn standpoint. She does need a follow up appt with the Breast Center as preciously advised, however Dr. Harolyn Rutherford did not feel this would hinder of further postpone her surgery. I told pt that I will follow up with [facility] Donath in [place] as to the results they still need if any. Pt voiced understanding.

## 2011-07-16 NOTE — Telephone Encounter (Signed)
Left message for Joanne Carlson that I am following up to confirm that she has received all of the information needed for [facility]. She may call me back next week if there is additional information needed.

## 2011-07-28 ENCOUNTER — Inpatient Hospital Stay (HOSPITAL_COMMUNITY): Admission: RE | Admit: 2011-07-28 | Payer: BC Managed Care – PPO | Source: Ambulatory Visit

## 2011-08-05 ENCOUNTER — Other Ambulatory Visit: Payer: Self-pay | Admitting: Nephrology

## 2011-08-05 DIAGNOSIS — N63 Unspecified lump in unspecified breast: Secondary | ICD-10-CM

## 2011-08-18 ENCOUNTER — Encounter (HOSPITAL_COMMUNITY)
Admission: RE | Admit: 2011-08-18 | Discharge: 2011-08-18 | Disposition: A | Discharge status: Home / Self Care | Payer: BC Managed Care – PPO | Source: Ambulatory Visit | Attending: Nephrology | Admitting: Nephrology

## 2011-08-18 DIAGNOSIS — D638 Anemia in other chronic diseases classified elsewhere: Secondary | ICD-10-CM | POA: Insufficient documentation

## 2011-08-18 DIAGNOSIS — N184 Chronic kidney disease, stage 4 (severe): Secondary | ICD-10-CM | POA: Diagnosis not present

## 2011-08-18 LAB — IRON AND TIBC: Iron: 58 ug/dL (ref 42–135)

## 2011-08-18 MED ORDER — EPOETIN ALFA 40000 UNIT/ML IJ SOLN
INTRAMUSCULAR | Status: AC
  Administered 2011-08-18: 40000 [IU] via SUBCUTANEOUS
  Filled 2011-08-18: qty 1

## 2011-08-18 MED ORDER — EPOETIN ALFA 10000 UNIT/ML IJ SOLN: 40000.0000 [IU] | INTRAMUSCULAR | Status: DC

## 2011-08-30 ENCOUNTER — Ambulatory Visit
Admission: RE | Admit: 2011-08-30 | Discharge: 2011-08-30 | Disposition: A | Discharge status: Home / Self Care | Payer: BC Managed Care – PPO | Source: Ambulatory Visit | Attending: Nephrology | Admitting: Nephrology

## 2011-08-30 DIAGNOSIS — N63 Unspecified lump in unspecified breast: Secondary | ICD-10-CM

## 2011-08-30 DIAGNOSIS — N6009 Solitary cyst of unspecified breast: Secondary | ICD-10-CM | POA: Diagnosis not present

## 2011-09-01 ENCOUNTER — Encounter (HOSPITAL_COMMUNITY): Payer: BC Managed Care – PPO

## 2011-09-27 DIAGNOSIS — Z8673 Personal history of transient ischemic attack (TIA), and cerebral infarction without residual deficits: Secondary | ICD-10-CM | POA: Insufficient documentation

## 2011-09-27 DIAGNOSIS — N185 Chronic kidney disease, stage 5: Secondary | ICD-10-CM | POA: Diagnosis not present

## 2011-09-27 DIAGNOSIS — N189 Chronic kidney disease, unspecified: Secondary | ICD-10-CM | POA: Diagnosis not present

## 2011-09-27 DIAGNOSIS — Z01818 Encounter for other preprocedural examination: Secondary | ICD-10-CM | POA: Diagnosis not present

## 2011-09-28 DIAGNOSIS — I498 Other specified cardiac arrhythmias: Secondary | ICD-10-CM | POA: Diagnosis not present

## 2011-10-11 DIAGNOSIS — R918 Other nonspecific abnormal finding of lung field: Secondary | ICD-10-CM | POA: Diagnosis not present

## 2011-10-11 DIAGNOSIS — N185 Chronic kidney disease, stage 5: Secondary | ICD-10-CM | POA: Diagnosis not present

## 2011-10-11 DIAGNOSIS — I700 Atherosclerosis of aorta: Secondary | ICD-10-CM | POA: Diagnosis not present

## 2011-10-15 DIAGNOSIS — Z01818 Encounter for other preprocedural examination: Secondary | ICD-10-CM | POA: Diagnosis not present

## 2011-10-15 DIAGNOSIS — Z01811 Encounter for preprocedural respiratory examination: Secondary | ICD-10-CM | POA: Diagnosis not present

## 2011-10-15 DIAGNOSIS — N2889 Other specified disorders of kidney and ureter: Secondary | ICD-10-CM | POA: Diagnosis not present

## 2011-10-15 DIAGNOSIS — N185 Chronic kidney disease, stage 5: Secondary | ICD-10-CM | POA: Diagnosis not present

## 2011-11-30 ENCOUNTER — Other Ambulatory Visit (HOSPITAL_COMMUNITY): Payer: Self-pay | Admitting: *Deleted

## 2011-11-30 DIAGNOSIS — R319 Hematuria, unspecified: Secondary | ICD-10-CM | POA: Diagnosis not present

## 2011-11-30 DIAGNOSIS — N2889 Other specified disorders of kidney and ureter: Secondary | ICD-10-CM | POA: Insufficient documentation

## 2011-12-03 ENCOUNTER — Encounter (HOSPITAL_COMMUNITY)
Admission: RE | Admit: 2011-12-03 | Discharge: 2011-12-03 | Disposition: A | Discharge status: Home / Self Care | Payer: BC Managed Care – PPO | Source: Ambulatory Visit | Attending: Nephrology | Admitting: Nephrology

## 2011-12-03 DIAGNOSIS — N184 Chronic kidney disease, stage 4 (severe): Secondary | ICD-10-CM | POA: Insufficient documentation

## 2011-12-03 DIAGNOSIS — D638 Anemia in other chronic diseases classified elsewhere: Secondary | ICD-10-CM | POA: Insufficient documentation

## 2011-12-03 LAB — FERRITIN: Ferritin: 324 ng/mL — ABNORMAL HIGH (ref 10–291)

## 2011-12-03 LAB — IRON AND TIBC
Saturation Ratios: 26 % (ref 20–55)
TIBC: 236 ug/dL — ABNORMAL LOW (ref 250–470)

## 2011-12-03 LAB — POCT HEMOGLOBIN-HEMACUE: Hemoglobin: 9 g/dL — ABNORMAL LOW (ref 12.0–15.0)

## 2011-12-03 MED ORDER — EPOETIN ALFA 10000 UNIT/ML IJ SOLN: 40000.0000 [IU] | INTRAMUSCULAR | Status: DC

## 2011-12-03 MED ORDER — EPOETIN ALFA 40000 UNIT/ML IJ SOLN
INTRAMUSCULAR | Status: AC
  Administered 2011-12-03: 40000 [IU] via SUBCUTANEOUS
  Filled 2011-12-03: qty 1

## 2011-12-14 ENCOUNTER — Other Ambulatory Visit (HOSPITAL_COMMUNITY): Payer: Self-pay | Admitting: *Deleted

## 2011-12-16 ENCOUNTER — Encounter (HOSPITAL_COMMUNITY)
Admission: RE | Admit: 2011-12-16 | Discharge: 2011-12-16 | Disposition: A | Discharge status: Home / Self Care | Payer: BC Managed Care – PPO | Source: Ambulatory Visit | Attending: Nephrology | Admitting: Nephrology

## 2011-12-16 DIAGNOSIS — N184 Chronic kidney disease, stage 4 (severe): Secondary | ICD-10-CM | POA: Insufficient documentation

## 2011-12-16 DIAGNOSIS — D638 Anemia in other chronic diseases classified elsewhere: Secondary | ICD-10-CM | POA: Insufficient documentation

## 2011-12-16 MED ORDER — EPOETIN ALFA 40000 UNIT/ML IJ SOLN
INTRAMUSCULAR | Status: AC
  Administered 2011-12-16: 40000 [IU] via SUBCUTANEOUS
  Filled 2011-12-16: qty 1

## 2011-12-16 MED ORDER — EPOETIN ALFA 10000 UNIT/ML IJ SOLN: 40000.0000 [IU] | INTRAMUSCULAR | Status: DC

## 2011-12-27 ENCOUNTER — Encounter (HOSPITAL_COMMUNITY): Payer: BC Managed Care – PPO

## 2012-01-01 ENCOUNTER — Encounter: Payer: Self-pay | Admitting: Emergency Medicine

## 2012-01-01 ENCOUNTER — Ambulatory Visit (INDEPENDENT_AMBULATORY_CARE_PROVIDER_SITE_OTHER): Payer: Medicare Other | Admitting: Emergency Medicine

## 2012-01-01 VITALS — BP 129/70 | HR 82 | Temp 98.0°F | Resp 20 | Ht 65.0 in | Wt 213.6 lb

## 2012-01-01 DIAGNOSIS — M109 Gout, unspecified: Secondary | ICD-10-CM

## 2012-01-01 MED ORDER — COLCHICINE 0.6 MG PO TABS: 0.6000 mg | ORAL_TABLET | Freq: Every day | ORAL | Status: DC

## 2012-01-01 NOTE — Progress Notes (Signed)
    Patient Name: Joanne Carlson Date of Birth: [birth year] Medical Record Number: [id number] Gender: female Date of Encounter: 01/01/2012  Chief Complaint: Foot Pain   History of Present Illness:  Joanne Carlson is a 66 y.o. very pleasant female patient who presents with the following:  Has history of gout.  No history of injury.  Very swollen and tender right great toe TMJ.  Has CRF and pending dialysis.  Patient Active Problem List  Diagnosis  . DIABETES MELLITUS, TYPE II  . HYPERCHOLESTEROLEMIA  . ANEMIA  . HYPERTENSION, BENIGN  . ALLERGIC RHINITIS  . RENAL INSUFFICIENCY   Past Medical History  Diagnosis Date  . Diabetes mellitus   . Chronic kidney disease    No past surgical history on file. History  Substance Use Topics  . Smoking status: Former Smoker -- 10 years    Types: Cigarettes  . Smokeless tobacco: Not on file  . Alcohol Use: Not on file   No family history on file. No Known Allergies  Medication list has been reviewed and updated.  Current Outpatient Prescriptions on File Prior to Visit  Medication Sig Dispense Refill  . fosinopril (MONOPRIL) 20 MG tablet Take 20 mg by mouth daily. Take 1 1/2 qd      . furosemide (LASIX) 80 MG tablet Take 80 mg by mouth 2 (two) times daily.      Marland Kitchen glipiZIDE (GLUCOTROL) 10 MG tablet Take 10 mg by mouth daily.      . pravastatin (PRAVACHOL) 20 MG tablet Take 20 mg by mouth daily.        Review of Systems:  As per HPI, otherwise negative.    Physical Examination: Filed Vitals:   01/01/12 1224  BP: 129/70  Pulse: 82  Temp: 98 F (36.7 C)  Resp: 20   Filed Vitals:   01/01/12 1224  Height: 5\' 5"  (1.651 m)  Weight: 213 lb 9.6 oz (96.888 kg)   Body mass index is 35.54 kg/(m^2). Ideal Body Weight: Weight in (lb) to have BMI = 25: 149.9    GEN: WDWN, NAD, Non-toxic, Alert & Oriented x 3 HEENT: Atraumatic, Normocephalic.  Ears and Nose: No external deformity. EXTR: No clubbing/cyanosis/edema NEURO:  Normal gait.  PSYCH: Normally interactive. Conversant. Not depressed or anxious appearing.  Calm demeanor.  Foot:  Right great toe MT joint tender, reddened and swollen.  No deformity  EKG / Labs / Xrays: None available at time of encounter  Assessment and Plan: Gout CRF [place]  Roselee Culver, MD

## 2012-01-03 ENCOUNTER — Encounter (HOSPITAL_COMMUNITY): Payer: BC Managed Care – PPO

## 2012-01-18 ENCOUNTER — Encounter (HOSPITAL_COMMUNITY): Payer: Medicare Other

## 2012-01-25 ENCOUNTER — Ambulatory Visit: Payer: BC Managed Care – PPO

## 2012-01-25 ENCOUNTER — Ambulatory Visit (INDEPENDENT_AMBULATORY_CARE_PROVIDER_SITE_OTHER): Payer: BC Managed Care – PPO | Admitting: Family Medicine

## 2012-01-25 ENCOUNTER — Ambulatory Visit (HOSPITAL_COMMUNITY)
Admission: RE | Admit: 2012-01-25 | Discharge: 2012-01-25 | Disposition: A | Discharge status: Home / Self Care | Payer: BC Managed Care – PPO | Source: Ambulatory Visit | Attending: Family Medicine | Admitting: Family Medicine

## 2012-01-25 ENCOUNTER — Encounter (HOSPITAL_COMMUNITY)
Admission: RE | Admit: 2012-01-25 | Discharge: 2012-01-25 | Disposition: A | Discharge status: Home / Self Care | Payer: BC Managed Care – PPO | Source: Ambulatory Visit | Attending: Nephrology | Admitting: Nephrology

## 2012-01-25 VITALS — BP 156/72 | HR 86 | Temp 98.5°F | Resp 16 | Ht 64.25 in | Wt 213.0 lb

## 2012-01-25 DIAGNOSIS — D638 Anemia in other chronic diseases classified elsewhere: Secondary | ICD-10-CM | POA: Insufficient documentation

## 2012-01-25 DIAGNOSIS — M79609 Pain in unspecified limb: Secondary | ICD-10-CM

## 2012-01-25 DIAGNOSIS — M7989 Other specified soft tissue disorders: Secondary | ICD-10-CM

## 2012-01-25 DIAGNOSIS — M25519 Pain in unspecified shoulder: Secondary | ICD-10-CM

## 2012-01-25 DIAGNOSIS — M79603 Pain in arm, unspecified: Secondary | ICD-10-CM

## 2012-01-25 DIAGNOSIS — N184 Chronic kidney disease, stage 4 (severe): Secondary | ICD-10-CM | POA: Insufficient documentation

## 2012-01-25 MED ORDER — EPOETIN ALFA 10000 UNIT/ML IJ SOLN: 40000.0000 [IU] | INTRAMUSCULAR | Status: DC

## 2012-01-25 MED ORDER — EPOETIN ALFA 40000 UNIT/ML IJ SOLN
INTRAMUSCULAR | Status: AC
  Administered 2012-01-25: 40000 [IU] via SUBCUTANEOUS
  Filled 2012-01-25: qty 1

## 2012-01-25 NOTE — Patient Instructions (Addendum)
Go to [place] at Admitting (on the first floor).  Let them know that you are there to have an ultrasound.

## 2012-01-25 NOTE — Progress Notes (Signed)
[facility] [address] [place] [address] [phone]  Date:  01/25/2012   Name:  Joanne Carlson   DOB:  Jun 08, 1946   MRN:  [id number]  PCP:  Aurora Mask, NP    Chief Complaint: Shoulder Pain   History of Present Illness:  Joanne Carlson is a 66 y.o. very pleasant female patient who presents with the following:  She noted right shoulder pain/ upper arm this morning when she woke up.  No known injury.  Never had this before.  She does not have to do any heavy lifting at her job and has not done any unusual activities lately.    She had an "iron shot" this morning at [facility] for her chronic anemia. She is not on dialysis but has been told that she may need to start this soon.  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   Her gout has settled down- see last OV.   No other symptoms at this time  Patient Active Problem List  Diagnosis  . DIABETES MELLITUS, TYPE II  . HYPERCHOLESTEROLEMIA  . ANEMIA  . HYPERTENSION, BENIGN  . ALLERGIC RHINITIS  . RENAL INSUFFICIENCY    Past Medical History  Diagnosis Date  . Diabetes mellitus   . Chronic kidney disease     No past surgical history on file.  History  Substance Use Topics  . Smoking status: Former Smoker -- 10 years    Types: Cigarettes  . Smokeless tobacco: Not on file  . Alcohol Use: Not on file    No family history on file.  No Known Allergies  Medication list has been reviewed and updated.  Current Outpatient Prescriptions on File Prior to Visit  Medication Sig Dispense Refill  . aspirin EC 81 MG tablet Take 81 mg by mouth daily.      . Calcium Carbonate Antacid 650 MG tablet Take 650 mg by mouth 3 (three) times daily with meals.      . colchicine 0.6 MG tablet Take 1 tablet (0.6 mg total) by mouth daily. Take two at once and another one in one hour.  Starting tomorrow  take one daily.  15 tablet  0  . fosinopril (MONOPRIL) 20 MG tablet Take 20 mg by mouth daily. Take 1 1/2 qd      . furosemide (LASIX) 80 MG tablet Take 80 mg by mouth 2 (two) times daily.      Marland Kitchen glipiZIDE (GLUCOTROL) 10 MG tablet Take 10 mg by mouth daily.      . pravastatin (PRAVACHOL) 20 MG tablet Take 20 mg by mouth daily.       Current Facility-Administered Medications on File Prior to Visit  Medication Dose Route Frequency Provider Last Rate Last Dose  . epoetin alfa (EPOGEN,PROCRIT) 96295 UNIT/ML injection        40,000 Units at 01/25/12 1454  . epoetin alfa (EPOGEN,PROCRIT) injection 40,000 Units  40,000 Units Subcutaneous Q14 Days Placido Sou, MD        Review of Systems:  As per HPI- otherwise negative.   Physical Examination: Filed Vitals:   01/25/12 1551  BP: 156/72  Pulse: 86  Temp: 98.5 F (36.9 C)  Resp: 16   Filed Vitals:   01/25/12 1551  Height: 5' 4.25" (1.632 m)  Weight: 213 lb (96.616 kg)   Body mass  index is 36.28 kg/(m^2). Ideal Body Weight: Weight in (lb) to have BMI = 25: 146.5   GEN: WDWN, NAD, Non-toxic, A & O x 3, obese HEENT: Atraumatic, Normocephalic. Neck supple. No masses, No LAD. Ears and Nose: No external deformity. CV: RRR, No M/G/R. No JVD. No thrill. No extra heart sounds. PULM: CTA B, no wheezes, crackles, rhonchi. No retractions. No resp. distress. No accessory muscle use. ABD: S, NT, ND, +BS. No rebound. No HSM. EXTR: No c/c/e.   Slight decrease in right hand grip strength, and minimal swelling right hand.  There is tenderness in her right proximal anterior upper arm, and she has pain and limited ROM to shoulder flexion, abduction, internal and external rotation.  Difficult to test biceps strength due to pain, but I do not see a difference in muscle bulk to suggest a biceps rupture.  She is unable to fully flex her right arm but this may be due to pain.  She states she has equal sensation in both hands, and her biceps DTR are equal  (minimally reactive) bilaterally.  No tension or severe swelling to suggest compartment syndrome, excellent radial pulse on the right.   NEURO Normal gait.  PSYCH: Normally interactive. Conversant. Not depressed or anxious appearing.  Calm demeanor.   [facility] reading (PRIMARY) by  Dr. Lorelei Pont. right shoulder: negative except for degenerative changes Right humerus: negative  Called her renal specialist- her most recent creatinine was 5.21 in June of this year. Her creatinine clearance is approx 1ml/ minute when calculating by her June creatinine.  She can safely take tylenol every 8 hours.    RIGHT HUMERUS - 2+ VIEW  Comparison: Preliminary reading of Dr. Lorelei Pont  Findings: Two views right humerus submitted. No acute fracture or subluxation. Mild degenerative changes right AC joint. Mild spurring of the right humeral head.  IMPRESSION: No acute fracture or subluxation. Mild degenerative changes right AC joint and humeral head.  RIGHT SHOULDER - 2+ VIEW  Comparison: Preliminary reading of Dr. Lorelei Pont  Findings: Three views of the right shoulder submitted. No acute fracture or subluxation. Mild degenerative changes right AC joint. Mild spurring of the humeral head. Small dystrophic calcifications adjacent to greater humeral tuberosity suspicious for calcific tendonitis.  IMPRESSION: No acute fracture or subluxation. Mild degenerative changes right AC joint. Mild spurring of the humeral head. Small dystrophic calcifications adjacent to greater humeral tuberosity suspicious for calcific tendonitis.  Assessment and Plan: [name] was sent for an urgent UE doppler this evening to rule- out an upper extremity DVT.  Received this result- negative- and discussed with her. She likely has a tendinis of her biceps tendon.  She was given an arm sling, and this has improved her comfort.  She will try just taking tylenol for the time being, but if this is not sufficient we can consider trying a  low dose of vicodin.  However, we do need to be very careful given her CRF.  Will plan to have her see ortho if not significantly better in a few days.    Lamar Blinks, MD

## 2012-01-26 LAB — FERRITIN: Ferritin: 288 ng/mL (ref 10–291)

## 2012-01-28 ENCOUNTER — Telehealth: Payer: Self-pay

## 2012-01-28 MED ORDER — HYDROCODONE-ACETAMINOPHEN 5-500 MG PO TABS: 1.0000 | ORAL_TABLET | Freq: Three times a day (TID) | ORAL | Status: AC | PRN

## 2012-01-28 NOTE — Telephone Encounter (Signed)
Sent in prescription for vicodin.  Ready to be faxed.

## 2012-01-28 NOTE — Telephone Encounter (Signed)
Christiane's daughter, [name], is stating that her mother was told to take Tylenol for her arm pain during her recent visit. The tylenol is not helping and would like something stronger.  Best [phone] Daughter Odie Sera [phone] [place], [place] - [address]

## 2012-01-29 NOTE — Telephone Encounter (Signed)
Pt daughter contacted and notified that rx was sent to pharmacy

## 2012-01-31 ENCOUNTER — Telehealth: Payer: Self-pay

## 2012-01-31 NOTE — Telephone Encounter (Signed)
Pt left a voicemail on referral desk to have dr copland contact her at [phone]

## 2012-02-01 ENCOUNTER — Other Ambulatory Visit (HOSPITAL_COMMUNITY): Payer: Self-pay | Admitting: *Deleted

## 2012-02-01 ENCOUNTER — Telehealth: Payer: Self-pay | Admitting: Family Medicine

## 2012-02-01 NOTE — Telephone Encounter (Signed)
Pt called and is c/o arm still swollen she also has paperwork she wants filled out to stay out of work, she is coming in today since she is not improving

## 2012-02-01 NOTE — Telephone Encounter (Signed)
Patient came in tonight requesting [facility]OW note from visit on 7/23 until today.  Patient plans to follow up with ortho this week re: injury.  Note ok'd by Autumn Patty PAC, and patient advised to follow up here sooner if pain worsens prior to ortho appt.  Patient agreed.

## 2012-02-02 ENCOUNTER — Encounter (HOSPITAL_COMMUNITY)
Admission: RE | Admit: 2012-02-02 | Discharge: 2012-02-02 | Disposition: A | Discharge status: Home / Self Care | Payer: BC Managed Care – PPO | Source: Ambulatory Visit | Attending: Nephrology | Admitting: Nephrology

## 2012-02-02 MED ORDER — SODIUM CHLORIDE 0.9 % IV SOLN
Freq: Once | INTRAVENOUS | Status: AC
  Administered 2012-02-02: 13:00:00 via INTRAVENOUS

## 2012-02-02 MED ORDER — FERUMOXYTOL INJECTION 510 MG/17 ML
510.0000 mg | Freq: Once | INTRAVENOUS | Status: AC
  Administered 2012-02-02: 510 mg via INTRAVENOUS
  Filled 2012-02-02: qty 17

## 2012-02-07 ENCOUNTER — Encounter (HOSPITAL_COMMUNITY)
Admission: RE | Admit: 2012-02-07 | Discharge: 2012-02-07 | Disposition: A | Discharge status: Home / Self Care | Payer: BC Managed Care – PPO | Source: Ambulatory Visit | Attending: Nephrology | Admitting: Nephrology

## 2012-02-07 DIAGNOSIS — N184 Chronic kidney disease, stage 4 (severe): Secondary | ICD-10-CM | POA: Insufficient documentation

## 2012-02-07 DIAGNOSIS — D638 Anemia in other chronic diseases classified elsewhere: Secondary | ICD-10-CM | POA: Insufficient documentation

## 2012-02-07 LAB — POCT HEMOGLOBIN-HEMACUE: Hemoglobin: 12.1 g/dL (ref 12.0–15.0)

## 2012-02-07 MED ORDER — EPOETIN ALFA 10000 UNIT/ML IJ SOLN: 40000.0000 [IU] | INTRAMUSCULAR | Status: DC

## 2012-02-21 ENCOUNTER — Encounter (HOSPITAL_COMMUNITY)
Admission: RE | Admit: 2012-02-21 | Discharge: 2012-02-21 | Disposition: A | Discharge status: Home / Self Care | Payer: BC Managed Care – PPO | Source: Ambulatory Visit | Attending: Nephrology | Admitting: Nephrology

## 2012-02-21 LAB — IRON AND TIBC
Iron: 62 ug/dL (ref 42–135)
Saturation Ratios: 27 % (ref 20–55)
UIBC: 164 ug/dL (ref 125–400)

## 2012-02-21 MED ORDER — EPOETIN ALFA 10000 UNIT/ML IJ SOLN: 40000.0000 [IU] | INTRAMUSCULAR | Status: DC

## 2012-02-21 MED ORDER — EPOETIN ALFA 40000 UNIT/ML IJ SOLN
INTRAMUSCULAR | Status: AC
  Administered 2012-02-21: 40000 [IU] via SUBCUTANEOUS
  Filled 2012-02-21: qty 1

## 2012-03-07 ENCOUNTER — Encounter (HOSPITAL_COMMUNITY)
Admission: RE | Admit: 2012-03-07 | Discharge: 2012-03-07 | Disposition: A | Discharge status: Home / Self Care | Payer: BC Managed Care – PPO | Source: Ambulatory Visit | Attending: Nephrology | Admitting: Nephrology

## 2012-03-07 DIAGNOSIS — N184 Chronic kidney disease, stage 4 (severe): Secondary | ICD-10-CM | POA: Insufficient documentation

## 2012-03-07 DIAGNOSIS — D638 Anemia in other chronic diseases classified elsewhere: Secondary | ICD-10-CM | POA: Insufficient documentation

## 2012-03-07 MED ORDER — EPOETIN ALFA 10000 UNIT/ML IJ SOLN: 40000.0000 [IU] | INTRAMUSCULAR | Status: DC

## 2012-03-07 MED ORDER — EPOETIN ALFA 40000 UNIT/ML IJ SOLN
INTRAMUSCULAR | Status: AC
  Administered 2012-03-07: 40000 [IU] via SUBCUTANEOUS
  Filled 2012-03-07: qty 1

## 2012-03-08 ENCOUNTER — Encounter (HOSPITAL_COMMUNITY): Payer: BC Managed Care – PPO

## 2012-03-15 DIAGNOSIS — M24119 Other articular cartilage disorders, unspecified shoulder: Secondary | ICD-10-CM | POA: Diagnosis not present

## 2012-03-15 DIAGNOSIS — M719 Bursopathy, unspecified: Secondary | ICD-10-CM | POA: Diagnosis not present

## 2012-03-15 DIAGNOSIS — M19019 Primary osteoarthritis, unspecified shoulder: Secondary | ICD-10-CM | POA: Diagnosis not present

## 2012-03-15 DIAGNOSIS — M7520 Bicipital tendinitis, unspecified shoulder: Secondary | ICD-10-CM | POA: Diagnosis not present

## 2012-03-17 ENCOUNTER — Other Ambulatory Visit (HOSPITAL_COMMUNITY): Payer: Self-pay | Admitting: *Deleted

## 2012-03-20 ENCOUNTER — Encounter (HOSPITAL_COMMUNITY): Payer: BC Managed Care – PPO

## 2012-04-03 ENCOUNTER — Encounter (HOSPITAL_COMMUNITY)
Admission: RE | Admit: 2012-04-03 | Discharge: 2012-04-03 | Disposition: A | Discharge status: Home / Self Care | Payer: BC Managed Care – PPO | Source: Ambulatory Visit | Attending: Nephrology | Admitting: Nephrology

## 2012-04-03 LAB — FERRITIN: Ferritin: 523 ng/mL — ABNORMAL HIGH (ref 10–291)

## 2012-04-03 LAB — IRON AND TIBC
Saturation Ratios: 26 % (ref 20–55)
TIBC: 249 ug/dL — ABNORMAL LOW (ref 250–470)

## 2012-04-03 LAB — POCT HEMOGLOBIN-HEMACUE: Hemoglobin: 10.8 g/dL — ABNORMAL LOW (ref 12.0–15.0)

## 2012-04-03 MED ORDER — EPOETIN ALFA 40000 UNIT/ML IJ SOLN
INTRAMUSCULAR | Status: AC
  Administered 2012-04-03: 40000 [IU] via SUBCUTANEOUS
  Filled 2012-04-03: qty 1

## 2012-04-03 MED ORDER — EPOETIN ALFA 10000 UNIT/ML IJ SOLN: 40000.0000 [IU] | INTRAMUSCULAR | Status: DC

## 2012-04-17 ENCOUNTER — Encounter (HOSPITAL_COMMUNITY)
Admission: RE | Admit: 2012-04-17 | Discharge: 2012-04-17 | Disposition: A | Discharge status: Home / Self Care | Payer: BC Managed Care – PPO | Source: Ambulatory Visit | Attending: Nephrology | Admitting: Nephrology

## 2012-04-17 DIAGNOSIS — E7800 Pure hypercholesterolemia, unspecified: Secondary | ICD-10-CM | POA: Insufficient documentation

## 2012-04-17 DIAGNOSIS — N259 Disorder resulting from impaired renal tubular function, unspecified: Secondary | ICD-10-CM | POA: Insufficient documentation

## 2012-04-17 DIAGNOSIS — I10 Essential (primary) hypertension: Secondary | ICD-10-CM | POA: Insufficient documentation

## 2012-04-17 DIAGNOSIS — E119 Type 2 diabetes mellitus without complications: Secondary | ICD-10-CM | POA: Insufficient documentation

## 2012-04-17 DIAGNOSIS — J309 Allergic rhinitis, unspecified: Secondary | ICD-10-CM | POA: Insufficient documentation

## 2012-04-17 DIAGNOSIS — D649 Anemia, unspecified: Secondary | ICD-10-CM | POA: Insufficient documentation

## 2012-04-17 LAB — POCT HEMOGLOBIN-HEMACUE: Hemoglobin: 11.2 g/dL — ABNORMAL LOW (ref 12.0–15.0)

## 2012-04-17 MED ORDER — EPOETIN ALFA 40000 UNIT/ML IJ SOLN
INTRAMUSCULAR | Status: AC
  Administered 2012-04-17: 40000 [IU] via SUBCUTANEOUS
  Filled 2012-04-17: qty 1

## 2012-04-17 MED ORDER — EPOETIN ALFA 10000 UNIT/ML IJ SOLN: 40000.0000 [IU] | INTRAMUSCULAR | Status: DC

## 2012-05-01 ENCOUNTER — Encounter (HOSPITAL_COMMUNITY)
Admission: RE | Admit: 2012-05-01 | Discharge: 2012-05-01 | Disposition: A | Discharge status: Home / Self Care | Payer: BC Managed Care – PPO | Source: Ambulatory Visit | Attending: Nephrology | Admitting: Nephrology

## 2012-05-01 VITALS — BP 133/64 | HR 73 | Temp 97.7°F | Resp 18

## 2012-05-01 DIAGNOSIS — I10 Essential (primary) hypertension: Secondary | ICD-10-CM

## 2012-05-01 DIAGNOSIS — E7800 Pure hypercholesterolemia, unspecified: Secondary | ICD-10-CM

## 2012-05-01 DIAGNOSIS — J309 Allergic rhinitis, unspecified: Secondary | ICD-10-CM

## 2012-05-01 DIAGNOSIS — D649 Anemia, unspecified: Secondary | ICD-10-CM

## 2012-05-01 DIAGNOSIS — N259 Disorder resulting from impaired renal tubular function, unspecified: Secondary | ICD-10-CM

## 2012-05-01 DIAGNOSIS — E119 Type 2 diabetes mellitus without complications: Secondary | ICD-10-CM

## 2012-05-01 LAB — POCT HEMOGLOBIN-HEMACUE: Hemoglobin: 12.3 g/dL (ref 12.0–15.0)

## 2012-05-01 LAB — IRON AND TIBC: TIBC: 249 ug/dL — ABNORMAL LOW (ref 250–470)

## 2012-05-01 MED ORDER — EPOETIN ALFA 10000 UNIT/ML IJ SOLN: 40000.0000 [IU] | INTRAMUSCULAR | Status: DC

## 2012-05-03 DIAGNOSIS — M25669 Stiffness of unspecified knee, not elsewhere classified: Secondary | ICD-10-CM | POA: Diagnosis not present

## 2012-05-03 DIAGNOSIS — M6281 Muscle weakness (generalized): Secondary | ICD-10-CM | POA: Diagnosis not present

## 2012-05-03 DIAGNOSIS — M75120 Complete rotator cuff tear or rupture of unspecified shoulder, not specified as traumatic: Secondary | ICD-10-CM | POA: Diagnosis not present

## 2012-05-15 ENCOUNTER — Encounter (HOSPITAL_COMMUNITY)
Admission: RE | Admit: 2012-05-15 | Discharge: 2012-05-15 | Disposition: A | Discharge status: Home / Self Care | Payer: PRIVATE HEALTH INSURANCE | Source: Ambulatory Visit | Attending: Nephrology | Admitting: Nephrology

## 2012-05-15 DIAGNOSIS — D649 Anemia, unspecified: Secondary | ICD-10-CM | POA: Diagnosis not present

## 2012-05-15 DIAGNOSIS — N259 Disorder resulting from impaired renal tubular function, unspecified: Secondary | ICD-10-CM | POA: Insufficient documentation

## 2012-05-15 DIAGNOSIS — E7800 Pure hypercholesterolemia, unspecified: Secondary | ICD-10-CM | POA: Insufficient documentation

## 2012-05-15 DIAGNOSIS — J309 Allergic rhinitis, unspecified: Secondary | ICD-10-CM | POA: Diagnosis not present

## 2012-05-15 DIAGNOSIS — I10 Essential (primary) hypertension: Secondary | ICD-10-CM | POA: Diagnosis not present

## 2012-05-15 DIAGNOSIS — E119 Type 2 diabetes mellitus without complications: Secondary | ICD-10-CM | POA: Diagnosis not present

## 2012-05-15 LAB — POCT HEMOGLOBIN-HEMACUE: Hemoglobin: 11.6 g/dL — ABNORMAL LOW (ref 12.0–15.0)

## 2012-05-15 MED ORDER — EPOETIN ALFA 10000 UNIT/ML IJ SOLN: 40000.0000 [IU] | INTRAMUSCULAR | Status: DC

## 2012-05-15 MED ORDER — EPOETIN ALFA 40000 UNIT/ML IJ SOLN
INTRAMUSCULAR | Status: AC
  Administered 2012-05-15: 40000 [IU] via SUBCUTANEOUS
  Filled 2012-05-15: qty 1

## 2012-05-29 ENCOUNTER — Encounter (HOSPITAL_COMMUNITY): Payer: BC Managed Care – PPO

## 2012-06-05 ENCOUNTER — Other Ambulatory Visit (HOSPITAL_COMMUNITY): Payer: Self-pay | Admitting: *Deleted

## 2012-06-06 ENCOUNTER — Encounter (HOSPITAL_COMMUNITY)
Admission: RE | Admit: 2012-06-06 | Discharge: 2012-06-06 | Disposition: A | Discharge status: Home / Self Care | Payer: Medicare Other | Source: Ambulatory Visit | Attending: Nephrology | Admitting: Nephrology

## 2012-06-06 DIAGNOSIS — N259 Disorder resulting from impaired renal tubular function, unspecified: Secondary | ICD-10-CM | POA: Diagnosis not present

## 2012-06-06 DIAGNOSIS — E119 Type 2 diabetes mellitus without complications: Secondary | ICD-10-CM | POA: Diagnosis not present

## 2012-06-06 DIAGNOSIS — I10 Essential (primary) hypertension: Secondary | ICD-10-CM | POA: Diagnosis not present

## 2012-06-06 DIAGNOSIS — E7800 Pure hypercholesterolemia, unspecified: Secondary | ICD-10-CM | POA: Insufficient documentation

## 2012-06-06 DIAGNOSIS — D649 Anemia, unspecified: Secondary | ICD-10-CM | POA: Diagnosis not present

## 2012-06-06 DIAGNOSIS — J309 Allergic rhinitis, unspecified: Secondary | ICD-10-CM | POA: Insufficient documentation

## 2012-06-06 LAB — IRON AND TIBC
Iron: 83 ug/dL (ref 42–135)
TIBC: 234 ug/dL — ABNORMAL LOW (ref 250–470)

## 2012-06-06 MED ORDER — EPOETIN ALFA 40000 UNIT/ML IJ SOLN
INTRAMUSCULAR | Status: AC
  Administered 2012-06-06: 40000 [IU] via SUBCUTANEOUS
  Filled 2012-06-06: qty 1

## 2012-06-06 MED ORDER — EPOETIN ALFA 10000 UNIT/ML IJ SOLN: 40000.0000 [IU] | INTRAMUSCULAR | Status: DC

## 2012-06-12 DIAGNOSIS — D649 Anemia, unspecified: Secondary | ICD-10-CM | POA: Diagnosis not present

## 2012-06-12 DIAGNOSIS — N390 Urinary tract infection, site not specified: Secondary | ICD-10-CM | POA: Diagnosis not present

## 2012-06-12 DIAGNOSIS — N185 Chronic kidney disease, stage 5: Secondary | ICD-10-CM | POA: Diagnosis not present

## 2012-06-12 DIAGNOSIS — E669 Obesity, unspecified: Secondary | ICD-10-CM | POA: Diagnosis not present

## 2012-06-12 DIAGNOSIS — E119 Type 2 diabetes mellitus without complications: Secondary | ICD-10-CM | POA: Diagnosis not present

## 2012-06-12 DIAGNOSIS — N2581 Secondary hyperparathyroidism of renal origin: Secondary | ICD-10-CM | POA: Diagnosis not present

## 2012-06-12 DIAGNOSIS — I10 Essential (primary) hypertension: Secondary | ICD-10-CM | POA: Diagnosis not present

## 2012-06-12 DIAGNOSIS — E213 Hyperparathyroidism, unspecified: Secondary | ICD-10-CM | POA: Diagnosis not present

## 2012-06-12 DIAGNOSIS — I12 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 stage renal disease: Secondary | ICD-10-CM | POA: Diagnosis not present

## 2012-06-27 ENCOUNTER — Encounter (HOSPITAL_COMMUNITY)
Admission: RE | Admit: 2012-06-27 | Discharge: 2012-06-27 | Disposition: A | Discharge status: Home / Self Care | Payer: Medicare Other | Source: Ambulatory Visit | Attending: Nephrology | Admitting: Nephrology

## 2012-06-27 DIAGNOSIS — E7800 Pure hypercholesterolemia, unspecified: Secondary | ICD-10-CM | POA: Diagnosis not present

## 2012-06-27 DIAGNOSIS — E119 Type 2 diabetes mellitus without complications: Secondary | ICD-10-CM | POA: Diagnosis not present

## 2012-06-27 DIAGNOSIS — I10 Essential (primary) hypertension: Secondary | ICD-10-CM | POA: Diagnosis not present

## 2012-06-27 DIAGNOSIS — J309 Allergic rhinitis, unspecified: Secondary | ICD-10-CM | POA: Diagnosis not present

## 2012-06-27 DIAGNOSIS — N259 Disorder resulting from impaired renal tubular function, unspecified: Secondary | ICD-10-CM | POA: Diagnosis not present

## 2012-06-27 DIAGNOSIS — D649 Anemia, unspecified: Secondary | ICD-10-CM | POA: Diagnosis not present

## 2012-06-27 MED ORDER — EPOETIN ALFA 40000 UNIT/ML IJ SOLN
INTRAMUSCULAR | Status: AC
  Administered 2012-06-27: 40000 [IU] via SUBCUTANEOUS
  Filled 2012-06-27: qty 1

## 2012-06-27 MED ORDER — EPOETIN ALFA 10000 UNIT/ML IJ SOLN: 40000.0000 [IU] | INTRAMUSCULAR | Status: DC

## 2012-07-18 ENCOUNTER — Encounter (HOSPITAL_COMMUNITY)
Admission: RE | Admit: 2012-07-18 | Discharge: 2012-07-18 | Disposition: A | Discharge status: Home / Self Care | Payer: Medicare Other | Source: Ambulatory Visit | Attending: Nephrology | Admitting: Nephrology

## 2012-07-18 DIAGNOSIS — D649 Anemia, unspecified: Secondary | ICD-10-CM | POA: Insufficient documentation

## 2012-07-18 DIAGNOSIS — E119 Type 2 diabetes mellitus without complications: Secondary | ICD-10-CM | POA: Insufficient documentation

## 2012-07-18 DIAGNOSIS — N259 Disorder resulting from impaired renal tubular function, unspecified: Secondary | ICD-10-CM | POA: Insufficient documentation

## 2012-07-18 DIAGNOSIS — I10 Essential (primary) hypertension: Secondary | ICD-10-CM | POA: Insufficient documentation

## 2012-07-18 DIAGNOSIS — J309 Allergic rhinitis, unspecified: Secondary | ICD-10-CM | POA: Insufficient documentation

## 2012-07-18 DIAGNOSIS — E7800 Pure hypercholesterolemia, unspecified: Secondary | ICD-10-CM | POA: Insufficient documentation

## 2012-07-18 LAB — FERRITIN: Ferritin: 370 ng/mL — ABNORMAL HIGH (ref 10–291)

## 2012-07-18 LAB — IRON AND TIBC
Iron: 85 ug/dL (ref 42–135)
Saturation Ratios: 36 % (ref 20–55)
TIBC: 233 ug/dL — ABNORMAL LOW (ref 250–470)
UIBC: 148 ug/dL (ref 125–400)

## 2012-07-18 MED ORDER — EPOETIN ALFA 40000 UNIT/ML IJ SOLN
INTRAMUSCULAR | Status: AC
  Filled 2012-07-18: qty 1

## 2012-07-18 MED ORDER — EPOETIN ALFA 10000 UNIT/ML IJ SOLN
40000.0000 [IU] | INTRAMUSCULAR | Status: DC
  Administered 2012-07-18: 40000 [IU] via SUBCUTANEOUS

## 2012-07-19 LAB — POCT HEMOGLOBIN-HEMACUE: Hemoglobin: 11.6 g/dL — ABNORMAL LOW (ref 12.0–15.0)

## 2012-08-08 ENCOUNTER — Encounter (HOSPITAL_COMMUNITY)
Admission: RE | Admit: 2012-08-08 | Discharge: 2012-08-08 | Disposition: A | Discharge status: Home / Self Care | Payer: Medicare Other | Source: Ambulatory Visit | Attending: Nephrology | Admitting: Nephrology

## 2012-08-08 DIAGNOSIS — J309 Allergic rhinitis, unspecified: Secondary | ICD-10-CM | POA: Insufficient documentation

## 2012-08-08 DIAGNOSIS — N259 Disorder resulting from impaired renal tubular function, unspecified: Secondary | ICD-10-CM | POA: Diagnosis not present

## 2012-08-08 DIAGNOSIS — E7800 Pure hypercholesterolemia, unspecified: Secondary | ICD-10-CM | POA: Insufficient documentation

## 2012-08-08 DIAGNOSIS — I10 Essential (primary) hypertension: Secondary | ICD-10-CM | POA: Diagnosis not present

## 2012-08-08 DIAGNOSIS — D649 Anemia, unspecified: Secondary | ICD-10-CM | POA: Insufficient documentation

## 2012-08-08 DIAGNOSIS — E119 Type 2 diabetes mellitus without complications: Secondary | ICD-10-CM | POA: Insufficient documentation

## 2012-08-08 MED ORDER — EPOETIN ALFA 10000 UNIT/ML IJ SOLN: 40000.0000 [IU] | INTRAMUSCULAR | Status: DC

## 2012-08-22 ENCOUNTER — Encounter (HOSPITAL_COMMUNITY)
Admission: RE | Admit: 2012-08-22 | Discharge: 2012-08-22 | Disposition: A | Discharge status: Home / Self Care | Payer: Medicare Other | Source: Ambulatory Visit | Attending: Nephrology | Admitting: Nephrology

## 2012-08-22 DIAGNOSIS — E119 Type 2 diabetes mellitus without complications: Secondary | ICD-10-CM | POA: Diagnosis not present

## 2012-08-22 DIAGNOSIS — E7800 Pure hypercholesterolemia, unspecified: Secondary | ICD-10-CM | POA: Diagnosis not present

## 2012-08-22 DIAGNOSIS — D649 Anemia, unspecified: Secondary | ICD-10-CM | POA: Diagnosis not present

## 2012-08-22 DIAGNOSIS — I10 Essential (primary) hypertension: Secondary | ICD-10-CM | POA: Diagnosis not present

## 2012-08-22 DIAGNOSIS — N259 Disorder resulting from impaired renal tubular function, unspecified: Secondary | ICD-10-CM | POA: Diagnosis not present

## 2012-08-22 DIAGNOSIS — J309 Allergic rhinitis, unspecified: Secondary | ICD-10-CM | POA: Diagnosis not present

## 2012-08-22 MED ORDER — EPOETIN ALFA 10000 UNIT/ML IJ SOLN
INTRAMUSCULAR | Status: AC
  Administered 2012-08-22: 10000 [IU] via SUBCUTANEOUS
  Filled 2012-08-22: qty 1

## 2012-08-22 MED ORDER — EPOETIN ALFA 20000 UNIT/ML IJ SOLN
INTRAMUSCULAR | Status: AC
  Administered 2012-08-22: 20000 [IU] via SUBCUTANEOUS
  Filled 2012-08-22: qty 1

## 2012-08-22 MED ORDER — EPOETIN ALFA 10000 UNIT/ML IJ SOLN
30000.0000 [IU] | INTRAMUSCULAR | Status: DC
  Administered 2012-08-22: 10000 [IU] via SUBCUTANEOUS

## 2012-09-05 DIAGNOSIS — E119 Type 2 diabetes mellitus without complications: Secondary | ICD-10-CM | POA: Diagnosis not present

## 2012-09-05 DIAGNOSIS — D649 Anemia, unspecified: Secondary | ICD-10-CM | POA: Diagnosis not present

## 2012-09-11 ENCOUNTER — Encounter (HOSPITAL_COMMUNITY): Payer: BC Managed Care – PPO

## 2012-09-18 ENCOUNTER — Other Ambulatory Visit (HOSPITAL_COMMUNITY): Payer: Self-pay | Admitting: *Deleted

## 2012-09-19 ENCOUNTER — Encounter (HOSPITAL_COMMUNITY)
Admission: RE | Admit: 2012-09-19 | Discharge: 2012-09-19 | Disposition: A | Discharge status: Home / Self Care | Payer: Medicare Other | Source: Ambulatory Visit | Attending: Nephrology | Admitting: Nephrology

## 2012-09-19 DIAGNOSIS — E7800 Pure hypercholesterolemia, unspecified: Secondary | ICD-10-CM | POA: Diagnosis not present

## 2012-09-19 DIAGNOSIS — N259 Disorder resulting from impaired renal tubular function, unspecified: Secondary | ICD-10-CM | POA: Insufficient documentation

## 2012-09-19 DIAGNOSIS — E119 Type 2 diabetes mellitus without complications: Secondary | ICD-10-CM | POA: Diagnosis not present

## 2012-09-19 DIAGNOSIS — J309 Allergic rhinitis, unspecified: Secondary | ICD-10-CM | POA: Diagnosis not present

## 2012-09-19 DIAGNOSIS — D649 Anemia, unspecified: Secondary | ICD-10-CM | POA: Insufficient documentation

## 2012-09-19 DIAGNOSIS — I10 Essential (primary) hypertension: Secondary | ICD-10-CM | POA: Diagnosis not present

## 2012-09-19 LAB — FERRITIN: Ferritin: 518 ng/mL — ABNORMAL HIGH (ref 10–291)

## 2012-09-19 LAB — IRON AND TIBC: UIBC: 148 ug/dL (ref 125–400)

## 2012-09-19 LAB — POCT HEMOGLOBIN-HEMACUE: Hemoglobin: 11.4 g/dL — ABNORMAL LOW (ref 12.0–15.0)

## 2012-09-19 MED ORDER — EPOETIN ALFA 10000 UNIT/ML IJ SOLN
30000.0000 [IU] | INTRAMUSCULAR | Status: DC
  Administered 2012-09-19: 30000 [IU] via SUBCUTANEOUS

## 2012-09-19 MED ORDER — EPOETIN ALFA 20000 UNIT/ML IJ SOLN
INTRAMUSCULAR | Status: AC
  Filled 2012-09-19: qty 1

## 2012-09-19 MED ORDER — EPOETIN ALFA 10000 UNIT/ML IJ SOLN
INTRAMUSCULAR | Status: AC
  Filled 2012-09-19: qty 1

## 2012-09-20 MED FILL — Epoetin Alfa Inj 20000 Unit/ML: INTRAMUSCULAR | Qty: 1 | Status: AC

## 2012-09-20 MED FILL — Epoetin Alfa Inj 10000 Unit/ML: INTRAMUSCULAR | Qty: 1 | Status: AC

## 2012-09-30 ENCOUNTER — Ambulatory Visit (INDEPENDENT_AMBULATORY_CARE_PROVIDER_SITE_OTHER): Payer: Medicare Other | Admitting: Emergency Medicine

## 2012-09-30 VITALS — BP 122/68 | HR 80 | Temp 98.1°F | Resp 18 | Wt 213.0 lb

## 2012-09-30 DIAGNOSIS — E1310 Other specified diabetes mellitus with ketoacidosis without coma: Secondary | ICD-10-CM

## 2012-09-30 DIAGNOSIS — M109 Gout, unspecified: Secondary | ICD-10-CM | POA: Diagnosis not present

## 2012-09-30 DIAGNOSIS — M79609 Pain in unspecified limb: Secondary | ICD-10-CM

## 2012-09-30 DIAGNOSIS — N289 Disorder of kidney and ureter, unspecified: Secondary | ICD-10-CM

## 2012-09-30 DIAGNOSIS — M79604 Pain in right leg: Secondary | ICD-10-CM

## 2012-09-30 DIAGNOSIS — E1110 Type 2 diabetes mellitus with ketoacidosis without coma: Secondary | ICD-10-CM

## 2012-09-30 LAB — COMPREHENSIVE METABOLIC PANEL
AST: 10 U/L (ref 0–37)
Alkaline Phosphatase: 108 U/L (ref 39–117)
BUN: 52 mg/dL — ABNORMAL HIGH (ref 6–23)
Calcium: 9.4 mg/dL (ref 8.4–10.5)
Creat: 4.77 mg/dL — ABNORMAL HIGH (ref 0.50–1.10)

## 2012-09-30 LAB — GLUCOSE, POCT (MANUAL RESULT ENTRY): POC Glucose: 369 mg/dl — AB (ref 70–99)

## 2012-09-30 MED ORDER — GABAPENTIN 100 MG PO CAPS: ORAL_CAPSULE | ORAL | Status: DC

## 2012-09-30 NOTE — Progress Notes (Signed)
  Subjective:    Patient ID: Joanne Carlson, female    DOB: June 19, 1946, 67 y.o.   MRN: [id number]  HPI Pt here for DM check. Used to go to [facility]. She sees Dr Deterding for her dialysis. She says she is planning to have her kidney removed. She is not on insulin. The patient has a complicated history. 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 In 2011 she had placement of an AV shunt in her left arm. [facility] to [facility] but since they closed she has not been able to get checkups for her diabetes. She is in today because she has gout. She needs refill of her gout medicine. Also of note she gets regular iron infusions at the hospital. This has been ordered by the hematologist. She also has regular checkups with Dr. Jimmy Footman     Review of Systems     Objective:   Physical Exam patient is alert and cooperative she is not in any distress. Her neck is supple. Her chest is clear to both auscultation and percussion. Cardiac exam was regular rate without murmurs. Abdomen is without tenderness examination of the lining or edema. She has an AV shunt in her left arm she has no deep tendon reflexes in either leg. Dorsalis pedis and posterior tibial pulses are 2+ in both legs Results for orders placed during the hospital encounter of 09/19/12  IRON AND TIBC      Result Value Range   Iron 84  42 - 135 ug/dL   TIBC 232 (*) 250 - 470 ug/dL   Saturation Ratios 36  20 - 55 %   UIBC 148  125 - 400 ug/dL  FERRITIN      Result Value Range   Ferritin 518 (*) 10 - 291 ng/mL  POCT HEMOGLOBIN-HEMACUE      Result Value Range   Hemoglobin 11.4 (*) 12.0 - 15.0 g/dL   Results for orders placed in visit on 09/30/12  GLUCOSE, POCT (MANUAL RESULT ENTRY)      Result Value Range   POC Glucose 369 (*) 70 - 99 mg/dl  POCT GLYCOSYLATED HEMOGLOBIN (HGB A1C)      Result Value Range   Hemoglobin A1C 7.6           Assessment & Plan:   Did go ahead and check her cemented to see what her kidney function is. She is having pain in her legs. She has some pain in her back. I've encouraged her to go ahead with her kidney surgery. We'll use gabapentin 100 mg twice a day.

## 2012-10-17 ENCOUNTER — Encounter (HOSPITAL_COMMUNITY)
Admission: RE | Admit: 2012-10-17 | Discharge: 2012-10-17 | Disposition: A | Discharge status: Home / Self Care | Payer: Medicare Other | Source: Ambulatory Visit | Attending: Nephrology | Admitting: Nephrology

## 2012-10-17 DIAGNOSIS — J309 Allergic rhinitis, unspecified: Secondary | ICD-10-CM | POA: Diagnosis not present

## 2012-10-17 DIAGNOSIS — N259 Disorder resulting from impaired renal tubular function, unspecified: Secondary | ICD-10-CM | POA: Insufficient documentation

## 2012-10-17 DIAGNOSIS — D649 Anemia, unspecified: Secondary | ICD-10-CM | POA: Diagnosis not present

## 2012-10-17 DIAGNOSIS — E119 Type 2 diabetes mellitus without complications: Secondary | ICD-10-CM | POA: Diagnosis not present

## 2012-10-17 DIAGNOSIS — N185 Chronic kidney disease, stage 5: Secondary | ICD-10-CM | POA: Diagnosis not present

## 2012-10-17 DIAGNOSIS — E7800 Pure hypercholesterolemia, unspecified: Secondary | ICD-10-CM | POA: Diagnosis not present

## 2012-10-17 DIAGNOSIS — I10 Essential (primary) hypertension: Secondary | ICD-10-CM | POA: Insufficient documentation

## 2012-10-17 DIAGNOSIS — M109 Gout, unspecified: Secondary | ICD-10-CM | POA: Diagnosis not present

## 2012-10-17 LAB — IRON AND TIBC
Saturation Ratios: 33 % (ref 20–55)
TIBC: 239 ug/dL — ABNORMAL LOW (ref 250–470)

## 2012-10-17 LAB — POCT HEMOGLOBIN-HEMACUE: Hemoglobin: 10.6 g/dL — ABNORMAL LOW (ref 12.0–15.0)

## 2012-10-17 MED ORDER — EPOETIN ALFA 20000 UNIT/ML IJ SOLN
INTRAMUSCULAR | Status: AC
  Administered 2012-10-17: 20000 [IU] via SUBCUTANEOUS
  Filled 2012-10-17: qty 1

## 2012-10-17 MED ORDER — EPOETIN ALFA 10000 UNIT/ML IJ SOLN
INTRAMUSCULAR | Status: AC
  Administered 2012-10-17: 10000 [IU] via SUBCUTANEOUS
  Filled 2012-10-17: qty 1

## 2012-10-17 MED ORDER — EPOETIN ALFA 10000 UNIT/ML IJ SOLN: 30000.0000 [IU] | INTRAMUSCULAR | Status: DC

## 2012-11-03 ENCOUNTER — Encounter: Payer: Self-pay | Admitting: Family Medicine

## 2012-11-03 ENCOUNTER — Ambulatory Visit (INDEPENDENT_AMBULATORY_CARE_PROVIDER_SITE_OTHER): Payer: Medicare Other | Admitting: Family Medicine

## 2012-11-03 VITALS — BP 120/60 | HR 87 | Temp 98.1°F | Resp 16 | Ht 64.0 in | Wt 211.4 lb

## 2012-11-03 DIAGNOSIS — I10 Essential (primary) hypertension: Secondary | ICD-10-CM

## 2012-11-03 DIAGNOSIS — IMO0001 Reserved for inherently not codable concepts without codable children: Secondary | ICD-10-CM

## 2012-11-03 MED ORDER — METHOCARBAMOL 500 MG PO TABS: ORAL_TABLET | ORAL | Status: DC

## 2012-11-03 NOTE — Progress Notes (Signed)
S:  This 67 y.o. AA female has Type II DM and chronic renal isnufficiency ( Cr>4.0) followed at [facility]; she was receiving medical care at [facility] until the facility closed last year. Diabetes was evaluated at [facility] by Dr. Everlene Farrier in March. A1c= 7.6% at that time. Pt not checking FSBS because she is not sure how to use glucometer (TruTouch?). She is complaint with medications and nutrition though she has renal restrictions. She has no new complaints and denies hypoglycemia.  HTN is well controlled and managed by Nephrologist who sees pt every other month or prn. (See Dr. Perfecto Kingdom note regarding advised renal surgery).  PMHx, Soc Hx and Fam Hx reviewed.  ROS: As per HPI; negative for diaphoresis, CP or tightness, palpitations, GI changes, myalgias, excessive thirst or hunger, pruritus, HA, dizziness, weakness or syncope.  O:  Filed Vitals:   11/03/12 0916  BP: 120/60  Pulse: 87  Temp: 98.1 F (36.7 C)  Resp: 16   GEN: In NAD; WN,WD. Appears mildly chronically ill. HENT: [place]/AT; EOMI with clear conj but muddy sclerae. Oroph clear and moist. COR: RRR. LUNGS: Normal resp rate and effort. SKIN: W&D; dull complexion. NEURO: A&O x 3; CNs intact. Nonfocal except for mild psychomotor slowing and flat affect.  A/P: Type II or unspecified type diabetes mellitus without mention of complication, uncontrolled-  Reviewed correct medication doses with pt; adequate refills available. CMA will instruct pt in use of glucometer.  HTN (hypertension)- stable; follow-up as scheduled with Nephrology.  RTC in 2 months.

## 2012-11-03 NOTE — Patient Instructions (Addendum)
I have explained the A1c test to you; try to eat as healthy as you can and follow the instructions given to you by the kidney specialist. Check you sugars daily. When you come back in 2 months, the A1c number will be checked again.

## 2012-11-06 ENCOUNTER — Other Ambulatory Visit: Payer: Self-pay

## 2012-11-06 MED ORDER — GLUCOSE BLOOD VI STRP: ORAL_STRIP | Status: DC

## 2012-11-07 DIAGNOSIS — I10 Essential (primary) hypertension: Secondary | ICD-10-CM | POA: Diagnosis not present

## 2012-11-07 DIAGNOSIS — N390 Urinary tract infection, site not specified: Secondary | ICD-10-CM | POA: Diagnosis not present

## 2012-11-07 DIAGNOSIS — N2581 Secondary hyperparathyroidism of renal origin: Secondary | ICD-10-CM | POA: Diagnosis not present

## 2012-11-07 DIAGNOSIS — N185 Chronic kidney disease, stage 5: Secondary | ICD-10-CM | POA: Diagnosis not present

## 2012-11-07 DIAGNOSIS — D649 Anemia, unspecified: Secondary | ICD-10-CM | POA: Diagnosis not present

## 2012-11-07 DIAGNOSIS — I12 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 stage renal disease: Secondary | ICD-10-CM | POA: Diagnosis not present

## 2012-11-08 ENCOUNTER — Other Ambulatory Visit: Payer: Self-pay

## 2012-11-08 MED ORDER — LANCETS MISC: Status: DC

## 2012-11-14 ENCOUNTER — Encounter (HOSPITAL_COMMUNITY)
Admission: RE | Admit: 2012-11-14 | Discharge: 2012-11-14 | Disposition: A | Discharge status: Home / Self Care | Payer: Medicare Other | Source: Ambulatory Visit | Attending: Nephrology | Admitting: Nephrology

## 2012-11-14 DIAGNOSIS — D649 Anemia, unspecified: Secondary | ICD-10-CM | POA: Insufficient documentation

## 2012-11-14 DIAGNOSIS — J309 Allergic rhinitis, unspecified: Secondary | ICD-10-CM | POA: Insufficient documentation

## 2012-11-14 DIAGNOSIS — E7800 Pure hypercholesterolemia, unspecified: Secondary | ICD-10-CM | POA: Diagnosis not present

## 2012-11-14 DIAGNOSIS — E119 Type 2 diabetes mellitus without complications: Secondary | ICD-10-CM | POA: Insufficient documentation

## 2012-11-14 DIAGNOSIS — I10 Essential (primary) hypertension: Secondary | ICD-10-CM | POA: Diagnosis not present

## 2012-11-14 DIAGNOSIS — N259 Disorder resulting from impaired renal tubular function, unspecified: Secondary | ICD-10-CM | POA: Insufficient documentation

## 2012-11-14 LAB — IRON AND TIBC: UIBC: 172 ug/dL (ref 125–400)

## 2012-11-14 MED ORDER — EPOETIN ALFA 20000 UNIT/ML IJ SOLN
INTRAMUSCULAR | Status: AC
  Administered 2012-11-14: 20000 [IU] via SUBCUTANEOUS
  Filled 2012-11-14: qty 1

## 2012-11-14 MED ORDER — EPOETIN ALFA 10000 UNIT/ML IJ SOLN
INTRAMUSCULAR | Status: AC
  Administered 2012-11-14: 10000 [IU] via SUBCUTANEOUS
  Filled 2012-11-14: qty 1

## 2012-11-14 MED ORDER — EPOETIN ALFA 10000 UNIT/ML IJ SOLN: 30000.0000 [IU] | INTRAMUSCULAR | Status: DC

## 2012-12-11 ENCOUNTER — Other Ambulatory Visit (HOSPITAL_COMMUNITY): Payer: Self-pay | Admitting: *Deleted

## 2012-12-12 ENCOUNTER — Encounter (HOSPITAL_COMMUNITY): Payer: BC Managed Care – PPO

## 2012-12-19 DIAGNOSIS — N2581 Secondary hyperparathyroidism of renal origin: Secondary | ICD-10-CM | POA: Diagnosis not present

## 2012-12-19 DIAGNOSIS — I10 Essential (primary) hypertension: Secondary | ICD-10-CM | POA: Diagnosis not present

## 2012-12-19 DIAGNOSIS — D649 Anemia, unspecified: Secondary | ICD-10-CM | POA: Diagnosis not present

## 2012-12-19 DIAGNOSIS — N390 Urinary tract infection, site not specified: Secondary | ICD-10-CM | POA: Diagnosis not present

## 2012-12-19 DIAGNOSIS — E119 Type 2 diabetes mellitus without complications: Secondary | ICD-10-CM | POA: Diagnosis not present

## 2012-12-19 DIAGNOSIS — N185 Chronic kidney disease, stage 5: Secondary | ICD-10-CM | POA: Diagnosis not present

## 2012-12-19 DIAGNOSIS — I12 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 stage renal disease: Secondary | ICD-10-CM | POA: Diagnosis not present

## 2012-12-22 ENCOUNTER — Encounter (HOSPITAL_COMMUNITY): Payer: Medicare Other

## 2013-01-02 ENCOUNTER — Encounter (HOSPITAL_COMMUNITY): Payer: Medicare Other

## 2013-01-09 ENCOUNTER — Encounter (HOSPITAL_COMMUNITY)
Admission: RE | Admit: 2013-01-09 | Discharge: 2013-01-09 | Disposition: A | Discharge status: Home / Self Care | Payer: Medicare Other | Source: Ambulatory Visit | Attending: Nephrology | Admitting: Nephrology

## 2013-01-09 DIAGNOSIS — N259 Disorder resulting from impaired renal tubular function, unspecified: Secondary | ICD-10-CM | POA: Diagnosis not present

## 2013-01-09 DIAGNOSIS — D649 Anemia, unspecified: Secondary | ICD-10-CM | POA: Insufficient documentation

## 2013-01-09 DIAGNOSIS — I10 Essential (primary) hypertension: Secondary | ICD-10-CM | POA: Insufficient documentation

## 2013-01-09 DIAGNOSIS — J309 Allergic rhinitis, unspecified: Secondary | ICD-10-CM | POA: Insufficient documentation

## 2013-01-09 DIAGNOSIS — E7800 Pure hypercholesterolemia, unspecified: Secondary | ICD-10-CM | POA: Insufficient documentation

## 2013-01-09 DIAGNOSIS — E119 Type 2 diabetes mellitus without complications: Secondary | ICD-10-CM | POA: Diagnosis not present

## 2013-01-09 LAB — POCT HEMOGLOBIN-HEMACUE: Hemoglobin: 10.4 g/dL — ABNORMAL LOW (ref 12.0–15.0)

## 2013-01-09 LAB — FERRITIN: Ferritin: 399 ng/mL — ABNORMAL HIGH (ref 10–291)

## 2013-01-09 MED ORDER — EPOETIN ALFA 20000 UNIT/ML IJ SOLN
INTRAMUSCULAR | Status: AC
  Administered 2013-01-09: 20000 [IU] via SUBCUTANEOUS
  Filled 2013-01-09: qty 1

## 2013-01-09 MED ORDER — EPOETIN ALFA 10000 UNIT/ML IJ SOLN
INTRAMUSCULAR | Status: AC
  Administered 2013-01-09: 10000 [IU] via SUBCUTANEOUS
  Filled 2013-01-09: qty 1

## 2013-01-09 MED ORDER — EPOETIN ALFA 10000 UNIT/ML IJ SOLN: 30000.0000 [IU] | INTRAMUSCULAR | Status: DC

## 2013-01-12 ENCOUNTER — Ambulatory Visit: Payer: Medicare Other | Admitting: Family Medicine

## 2013-01-18 ENCOUNTER — Ambulatory Visit: Payer: Self-pay | Admitting: Family Medicine

## 2013-02-06 ENCOUNTER — Encounter (HOSPITAL_COMMUNITY): Payer: Medicare Other

## 2013-02-14 DIAGNOSIS — D631 Anemia in chronic kidney disease: Secondary | ICD-10-CM | POA: Diagnosis not present

## 2013-02-14 DIAGNOSIS — N039 Chronic nephritic syndrome with unspecified morphologic changes: Secondary | ICD-10-CM | POA: Diagnosis not present

## 2013-02-14 DIAGNOSIS — N2581 Secondary hyperparathyroidism of renal origin: Secondary | ICD-10-CM | POA: Diagnosis not present

## 2013-02-14 DIAGNOSIS — M109 Gout, unspecified: Secondary | ICD-10-CM | POA: Diagnosis not present

## 2013-02-14 DIAGNOSIS — N185 Chronic kidney disease, stage 5: Secondary | ICD-10-CM | POA: Diagnosis not present

## 2013-02-14 DIAGNOSIS — D649 Anemia, unspecified: Secondary | ICD-10-CM | POA: Diagnosis not present

## 2013-02-14 DIAGNOSIS — E119 Type 2 diabetes mellitus without complications: Secondary | ICD-10-CM | POA: Diagnosis not present

## 2013-02-19 ENCOUNTER — Encounter (HOSPITAL_COMMUNITY)
Admission: RE | Admit: 2013-02-19 | Discharge: 2013-02-19 | Disposition: A | Discharge status: Home / Self Care | Payer: Medicare Other | Source: Ambulatory Visit | Attending: Nephrology | Admitting: Nephrology

## 2013-02-19 DIAGNOSIS — E119 Type 2 diabetes mellitus without complications: Secondary | ICD-10-CM | POA: Insufficient documentation

## 2013-02-19 DIAGNOSIS — D649 Anemia, unspecified: Secondary | ICD-10-CM | POA: Diagnosis not present

## 2013-02-19 DIAGNOSIS — N259 Disorder resulting from impaired renal tubular function, unspecified: Secondary | ICD-10-CM | POA: Diagnosis not present

## 2013-02-19 DIAGNOSIS — J309 Allergic rhinitis, unspecified: Secondary | ICD-10-CM | POA: Insufficient documentation

## 2013-02-19 DIAGNOSIS — I10 Essential (primary) hypertension: Secondary | ICD-10-CM | POA: Insufficient documentation

## 2013-02-19 DIAGNOSIS — E7800 Pure hypercholesterolemia, unspecified: Secondary | ICD-10-CM | POA: Diagnosis not present

## 2013-02-19 LAB — IRON AND TIBC
Iron: 64 ug/dL (ref 42–135)
Saturation Ratios: 27 % (ref 20–55)
TIBC: 236 ug/dL — ABNORMAL LOW (ref 250–470)
UIBC: 172 ug/dL (ref 125–400)

## 2013-02-19 MED ORDER — EPOETIN ALFA 20000 UNIT/ML IJ SOLN
INTRAMUSCULAR | Status: AC
  Administered 2013-02-19: 20000 [IU] via SUBCUTANEOUS
  Filled 2013-02-19: qty 1

## 2013-02-19 MED ORDER — EPOETIN ALFA 10000 UNIT/ML IJ SOLN: 30000.0000 [IU] | INTRAMUSCULAR | Status: DC

## 2013-02-19 MED ORDER — EPOETIN ALFA 10000 UNIT/ML IJ SOLN
INTRAMUSCULAR | Status: AC
  Administered 2013-02-19: 10000 [IU] via SUBCUTANEOUS
  Filled 2013-02-19: qty 1

## 2013-03-16 ENCOUNTER — Other Ambulatory Visit (HOSPITAL_COMMUNITY): Payer: Self-pay | Admitting: *Deleted

## 2013-03-19 ENCOUNTER — Encounter (HOSPITAL_COMMUNITY): Payer: Medicare Other

## 2013-04-02 ENCOUNTER — Encounter (HOSPITAL_COMMUNITY)
Admission: RE | Admit: 2013-04-02 | Discharge: 2013-04-02 | Disposition: A | Discharge status: Home / Self Care | Payer: Medicare Other | Source: Ambulatory Visit | Attending: Nephrology | Admitting: Nephrology

## 2013-04-02 DIAGNOSIS — J309 Allergic rhinitis, unspecified: Secondary | ICD-10-CM | POA: Insufficient documentation

## 2013-04-02 DIAGNOSIS — D649 Anemia, unspecified: Secondary | ICD-10-CM | POA: Insufficient documentation

## 2013-04-02 DIAGNOSIS — N259 Disorder resulting from impaired renal tubular function, unspecified: Secondary | ICD-10-CM | POA: Diagnosis not present

## 2013-04-02 DIAGNOSIS — E7800 Pure hypercholesterolemia, unspecified: Secondary | ICD-10-CM | POA: Insufficient documentation

## 2013-04-02 DIAGNOSIS — I10 Essential (primary) hypertension: Secondary | ICD-10-CM | POA: Diagnosis not present

## 2013-04-02 DIAGNOSIS — E119 Type 2 diabetes mellitus without complications: Secondary | ICD-10-CM | POA: Diagnosis not present

## 2013-04-02 LAB — IRON AND TIBC: UIBC: 161 ug/dL (ref 125–400)

## 2013-04-02 LAB — FERRITIN: Ferritin: 379 ng/mL — ABNORMAL HIGH (ref 10–291)

## 2013-04-02 LAB — POCT HEMOGLOBIN-HEMACUE: Hemoglobin: 10.1 g/dL — ABNORMAL LOW (ref 12.0–15.0)

## 2013-04-02 MED ORDER — EPOETIN ALFA 10000 UNIT/ML IJ SOLN: 30000.0000 [IU] | INTRAMUSCULAR | Status: DC

## 2013-04-02 MED ORDER — EPOETIN ALFA 20000 UNIT/ML IJ SOLN
INTRAMUSCULAR | Status: AC
  Administered 2013-04-02: 20000 [IU] via SUBCUTANEOUS
  Filled 2013-04-02: qty 1

## 2013-04-02 MED ORDER — EPOETIN ALFA 10000 UNIT/ML IJ SOLN
INTRAMUSCULAR | Status: AC
  Administered 2013-04-02: 10000 [IU] via SUBCUTANEOUS
  Filled 2013-04-02: qty 1

## 2013-04-30 ENCOUNTER — Encounter (HOSPITAL_COMMUNITY)
Admission: RE | Admit: 2013-04-30 | Discharge: 2013-04-30 | Disposition: A | Discharge status: Home / Self Care | Payer: Medicare Other | Source: Ambulatory Visit | Attending: Nephrology | Admitting: Nephrology

## 2013-04-30 DIAGNOSIS — I10 Essential (primary) hypertension: Secondary | ICD-10-CM | POA: Insufficient documentation

## 2013-04-30 DIAGNOSIS — J309 Allergic rhinitis, unspecified: Secondary | ICD-10-CM | POA: Diagnosis not present

## 2013-04-30 DIAGNOSIS — E7800 Pure hypercholesterolemia, unspecified: Secondary | ICD-10-CM | POA: Diagnosis not present

## 2013-04-30 DIAGNOSIS — D649 Anemia, unspecified: Secondary | ICD-10-CM | POA: Diagnosis not present

## 2013-04-30 DIAGNOSIS — E119 Type 2 diabetes mellitus without complications: Secondary | ICD-10-CM | POA: Insufficient documentation

## 2013-04-30 DIAGNOSIS — N259 Disorder resulting from impaired renal tubular function, unspecified: Secondary | ICD-10-CM | POA: Diagnosis not present

## 2013-04-30 LAB — IRON AND TIBC: UIBC: 179 ug/dL (ref 125–400)

## 2013-04-30 LAB — POCT HEMOGLOBIN-HEMACUE: Hemoglobin: 10.6 g/dL — ABNORMAL LOW (ref 12.0–15.0)

## 2013-04-30 LAB — FERRITIN: Ferritin: 397 ng/mL — ABNORMAL HIGH (ref 10–291)

## 2013-04-30 MED ORDER — EPOETIN ALFA 20000 UNIT/ML IJ SOLN
INTRAMUSCULAR | Status: AC
  Administered 2013-04-30: 10:00:00 20000 [IU] via SUBCUTANEOUS
  Filled 2013-04-30: qty 1

## 2013-04-30 MED ORDER — EPOETIN ALFA 10000 UNIT/ML IJ SOLN
INTRAMUSCULAR | Status: AC
  Administered 2013-04-30: 10:00:00 10000 [IU] via SUBCUTANEOUS
  Filled 2013-04-30: qty 1

## 2013-04-30 MED ORDER — EPOETIN ALFA 10000 UNIT/ML IJ SOLN: 30000.0000 [IU] | INTRAMUSCULAR | Status: DC

## 2013-05-25 ENCOUNTER — Other Ambulatory Visit: Payer: Self-pay | Admitting: Emergency Medicine

## 2013-05-28 ENCOUNTER — Encounter (HOSPITAL_COMMUNITY)
Admission: RE | Admit: 2013-05-28 | Discharge: 2013-05-28 | Disposition: A | Discharge status: Home / Self Care | Payer: Medicare Other | Source: Ambulatory Visit | Attending: Nephrology | Admitting: Nephrology

## 2013-05-28 DIAGNOSIS — E119 Type 2 diabetes mellitus without complications: Secondary | ICD-10-CM | POA: Insufficient documentation

## 2013-05-28 DIAGNOSIS — N259 Disorder resulting from impaired renal tubular function, unspecified: Secondary | ICD-10-CM | POA: Insufficient documentation

## 2013-05-28 DIAGNOSIS — D649 Anemia, unspecified: Secondary | ICD-10-CM | POA: Diagnosis not present

## 2013-05-28 DIAGNOSIS — E7800 Pure hypercholesterolemia, unspecified: Secondary | ICD-10-CM | POA: Diagnosis not present

## 2013-05-28 DIAGNOSIS — I10 Essential (primary) hypertension: Secondary | ICD-10-CM | POA: Diagnosis not present

## 2013-05-28 DIAGNOSIS — J309 Allergic rhinitis, unspecified: Secondary | ICD-10-CM | POA: Insufficient documentation

## 2013-05-28 LAB — POCT HEMOGLOBIN-HEMACUE: Hemoglobin: 10.8 g/dL — ABNORMAL LOW (ref 12.0–15.0)

## 2013-05-28 LAB — IRON AND TIBC
Saturation Ratios: 29 % (ref 20–55)
TIBC: 224 ug/dL — ABNORMAL LOW (ref 250–470)

## 2013-05-28 MED ORDER — EPOETIN ALFA 10000 UNIT/ML IJ SOLN
INTRAMUSCULAR | Status: AC
  Administered 2013-05-28: 11:00:00 10000 [IU] via SUBCUTANEOUS
  Filled 2013-05-28: qty 1

## 2013-05-28 MED ORDER — EPOETIN ALFA 10000 UNIT/ML IJ SOLN: 30000.0000 [IU] | INTRAMUSCULAR | Status: DC

## 2013-05-28 MED ORDER — EPOETIN ALFA 20000 UNIT/ML IJ SOLN
INTRAMUSCULAR | Status: AC
  Administered 2013-05-28: 11:00:00 20000 [IU] via SUBCUTANEOUS
  Filled 2013-05-28: qty 1

## 2013-06-01 DIAGNOSIS — N2889 Other specified disorders of kidney and ureter: Secondary | ICD-10-CM | POA: Diagnosis not present

## 2013-06-01 DIAGNOSIS — N289 Disorder of kidney and ureter, unspecified: Secondary | ICD-10-CM | POA: Diagnosis not present

## 2013-06-08 DIAGNOSIS — N2581 Secondary hyperparathyroidism of renal origin: Secondary | ICD-10-CM | POA: Diagnosis not present

## 2013-06-08 DIAGNOSIS — D649 Anemia, unspecified: Secondary | ICD-10-CM | POA: Diagnosis not present

## 2013-06-08 DIAGNOSIS — Z23 Encounter for immunization: Secondary | ICD-10-CM | POA: Diagnosis not present

## 2013-06-08 DIAGNOSIS — I12 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 stage renal disease: Secondary | ICD-10-CM | POA: Diagnosis not present

## 2013-06-08 DIAGNOSIS — E785 Hyperlipidemia, unspecified: Secondary | ICD-10-CM | POA: Diagnosis not present

## 2013-06-08 DIAGNOSIS — M109 Gout, unspecified: Secondary | ICD-10-CM | POA: Diagnosis not present

## 2013-06-08 DIAGNOSIS — N185 Chronic kidney disease, stage 5: Secondary | ICD-10-CM | POA: Diagnosis not present

## 2013-06-18 DIAGNOSIS — N185 Chronic kidney disease, stage 5: Secondary | ICD-10-CM | POA: Diagnosis not present

## 2013-06-21 ENCOUNTER — Other Ambulatory Visit: Payer: Self-pay | Admitting: *Deleted

## 2013-06-21 DIAGNOSIS — T82598A Other mechanical complication of other cardiac and vascular devices and implants, initial encounter: Secondary | ICD-10-CM

## 2013-06-22 ENCOUNTER — Other Ambulatory Visit (HOSPITAL_COMMUNITY): Payer: Self-pay | Admitting: *Deleted

## 2013-06-25 ENCOUNTER — Encounter (HOSPITAL_COMMUNITY)
Admission: RE | Admit: 2013-06-25 | Discharge: 2013-06-25 | Disposition: A | Discharge status: Home / Self Care | Payer: Medicare Other | Source: Ambulatory Visit | Attending: Nephrology | Admitting: Nephrology

## 2013-06-25 DIAGNOSIS — D649 Anemia, unspecified: Secondary | ICD-10-CM | POA: Insufficient documentation

## 2013-06-25 DIAGNOSIS — I10 Essential (primary) hypertension: Secondary | ICD-10-CM | POA: Diagnosis not present

## 2013-06-25 DIAGNOSIS — E119 Type 2 diabetes mellitus without complications: Secondary | ICD-10-CM | POA: Diagnosis not present

## 2013-06-25 DIAGNOSIS — E7800 Pure hypercholesterolemia, unspecified: Secondary | ICD-10-CM | POA: Diagnosis not present

## 2013-06-25 DIAGNOSIS — J309 Allergic rhinitis, unspecified: Secondary | ICD-10-CM | POA: Insufficient documentation

## 2013-06-25 DIAGNOSIS — N259 Disorder resulting from impaired renal tubular function, unspecified: Secondary | ICD-10-CM | POA: Diagnosis not present

## 2013-06-25 LAB — FERRITIN: Ferritin: 388 ng/mL — ABNORMAL HIGH (ref 10–291)

## 2013-06-25 LAB — IRON AND TIBC
Iron: 70 ug/dL (ref 42–135)
Saturation Ratios: 30 % (ref 20–55)
UIBC: 163 ug/dL (ref 125–400)

## 2013-06-25 MED ORDER — EPOETIN ALFA 10000 UNIT/ML IJ SOLN
INTRAMUSCULAR | Status: AC
  Administered 2013-06-25: 10000 [IU] via SUBCUTANEOUS
  Filled 2013-06-25: qty 1

## 2013-06-25 MED ORDER — EPOETIN ALFA 10000 UNIT/ML IJ SOLN: 30000.0000 [IU] | INTRAMUSCULAR | Status: DC

## 2013-06-25 MED ORDER — EPOETIN ALFA 20000 UNIT/ML IJ SOLN
INTRAMUSCULAR | Status: AC
  Administered 2013-06-25: 20000 [IU] via SUBCUTANEOUS
  Filled 2013-06-25: qty 1

## 2013-06-26 DIAGNOSIS — Z01818 Encounter for other preprocedural examination: Secondary | ICD-10-CM | POA: Diagnosis not present

## 2013-06-26 DIAGNOSIS — N289 Disorder of kidney and ureter, unspecified: Secondary | ICD-10-CM | POA: Diagnosis not present

## 2013-07-16 ENCOUNTER — Ambulatory Visit: Payer: Medicare Other | Admitting: Surgery

## 2013-07-16 ENCOUNTER — Other Ambulatory Visit (HOSPITAL_COMMUNITY): Payer: Medicare Other

## 2013-07-19 ENCOUNTER — Encounter: Payer: Self-pay | Admitting: Vascular Surgery

## 2013-07-20 ENCOUNTER — Other Ambulatory Visit: Payer: Self-pay | Admitting: *Deleted

## 2013-07-20 ENCOUNTER — Encounter: Payer: Self-pay | Admitting: Vascular Surgery

## 2013-07-20 ENCOUNTER — Other Ambulatory Visit: Payer: Self-pay | Admitting: Vascular Surgery

## 2013-07-20 ENCOUNTER — Ambulatory Visit (INDEPENDENT_AMBULATORY_CARE_PROVIDER_SITE_OTHER): Payer: Medicare Other | Admitting: Vascular Surgery

## 2013-07-20 ENCOUNTER — Ambulatory Visit (HOSPITAL_COMMUNITY)
Admission: RE | Admit: 2013-07-20 | Discharge: 2013-07-20 | Disposition: A | Discharge status: Home / Self Care | Payer: Medicare Other | Source: Ambulatory Visit | Attending: Vascular Surgery | Admitting: Vascular Surgery

## 2013-07-20 ENCOUNTER — Encounter (INDEPENDENT_AMBULATORY_CARE_PROVIDER_SITE_OTHER): Payer: Self-pay

## 2013-07-20 ENCOUNTER — Ambulatory Visit (INDEPENDENT_AMBULATORY_CARE_PROVIDER_SITE_OTHER)
Admission: RE | Admit: 2013-07-20 | Discharge: 2013-07-20 | Disposition: A | Discharge status: Home / Self Care | Payer: Medicare Other | Source: Ambulatory Visit | Attending: Vascular Surgery | Admitting: Vascular Surgery

## 2013-07-20 VITALS — BP 163/73 | HR 87 | Ht 64.0 in | Wt 211.5 lb

## 2013-07-20 DIAGNOSIS — I82619 Acute embolism and thrombosis of superficial veins of unspecified upper extremity: Secondary | ICD-10-CM | POA: Diagnosis not present

## 2013-07-20 DIAGNOSIS — N184 Chronic kidney disease, stage 4 (severe): Secondary | ICD-10-CM

## 2013-07-20 DIAGNOSIS — Z8673 Personal history of transient ischemic attack (TIA), and cerebral infarction without residual deficits: Secondary | ICD-10-CM | POA: Insufficient documentation

## 2013-07-20 DIAGNOSIS — I739 Peripheral vascular disease, unspecified: Secondary | ICD-10-CM | POA: Insufficient documentation

## 2013-07-20 DIAGNOSIS — N189 Chronic kidney disease, unspecified: Secondary | ICD-10-CM

## 2013-07-20 DIAGNOSIS — T82598A Other mechanical complication of other cardiac and vascular devices and implants, initial encounter: Secondary | ICD-10-CM

## 2013-07-20 DIAGNOSIS — E119 Type 2 diabetes mellitus without complications: Secondary | ICD-10-CM | POA: Diagnosis not present

## 2013-07-20 DIAGNOSIS — N186 End stage renal disease: Secondary | ICD-10-CM

## 2013-07-20 DIAGNOSIS — T82898A Other specified complication of vascular prosthetic devices, implants and grafts, initial encounter: Secondary | ICD-10-CM | POA: Insufficient documentation

## 2013-07-20 DIAGNOSIS — Z01810 Encounter for preprocedural cardiovascular examination: Secondary | ICD-10-CM

## 2013-07-20 DIAGNOSIS — Y849 Medical procedure, unspecified as the cause of abnormal reaction of the patient, or of later complication, without mention of misadventure at the time of the procedure: Secondary | ICD-10-CM | POA: Insufficient documentation

## 2013-07-20 NOTE — Progress Notes (Signed)
Referred by:  Placido Sou, MD [address] [place],  [address]  Reason for referral: New access  History of Present Illness  Joanne Carlson is a 68 y.o. (03-Sep-1945) female who presents for evaluation for permanent access.  The patient is right hand dominant.  The patient previous access procedures include: L RC AVF, L BVT.  She was sent her for evaluation of her poorly functioning L BVT.  Previous central venous cannulation procedures include: mpme.  The patient has mever had a PPM placed.   Past Medical History  Diagnosis Date  . Diabetes mellitus   . Chronic kidney disease   . Stroke   . Peripheral vascular disease     Past Surgical History  Procedure Laterality Date  . Appendectomy    . Tubal ligation      History   Social History  . Marital Status: Single    Spouse Name: N/A    Number of Children: N/A  . Years of Education: N/A   Occupational History  . Not on file.   Social History Main Topics  . Smoking status: Former Smoker -- 10 years    Types: Cigarettes    Quit date: 07/05/1978  . Smokeless tobacco: Not on file  . Alcohol Use: Yes     Comment: occ  . Drug Use: No  . Sexual Activity: No   Other Topics Concern  . Not on file   Social History Narrative  . No narrative on file    Family History  Problem Relation Age of Onset  . Depression Mother   . Hypertension Mother   . Depression Father   . Depression Sister   . Hypertension Sister     Current Outpatient Prescriptions on File Prior to Visit  Medication Sig Dispense Refill  . aspirin EC 81 MG tablet Take 81 mg by mouth daily.      . calcitRIOL (ROCALTROL) 0.5 MCG capsule Take 0.5 mcg by mouth daily.      . Calcium Carbonate Antacid 650 MG tablet Take 650 mg by mouth 3 (three) times daily with meals.      . fosinopril (MONOPRIL) 20 MG tablet Take 20 mg by mouth daily. Take 1 1/2 qd      . furosemide (LASIX) 80 MG tablet Take 80 mg by mouth 2 (two) times daily.      Marland Kitchen  gabapentin (NEURONTIN) 100 MG capsule Take 1 capsule by mouth every morning and 1 capsule at bedtime. PATIENT NEEDS OFFICE VISIT FOR ADDITIONAL REFILLS  60 capsule  0  . glipiZIDE (GLUCOTROL) 10 MG tablet Take 10 mg by mouth daily.      Marland Kitchen glucose blood test strip Use to test blood sugar daily. Dx code: 250.02.  100 each  3  . Lancets MISC Use to test blood sugar daily. Dx code 250.02.  100 each  3  . methocarbamol (ROBAXIN) 500 MG tablet Take 1 tablet bid prn for muscle spasms or back pain.  60 tablet  1  . pravastatin (PRAVACHOL) 20 MG tablet Take 20 mg by mouth daily.      Marland Kitchen PRESCRIPTION MEDICATION Calc Acetate 667 mg take one capsule by mouth three times a day with meals      . colchicine 0.6 MG tablet Take 1 tablet (0.6 mg total) by mouth daily. Take two at once and another one in one hour.  Starting tomorrow take one daily.  15 tablet  0   No current facility-administered medications  on file prior to visit.    Allergies  Allergen Reactions  . Tylenol [Acetaminophen]     Rash    REVIEW OF SYSTEMS:  (Positives checked otherwise negative)  CARDIOVASCULAR:  []  chest pain, []  chest pressure, []  palpitations, []  shortness of breath when laying flat, []  shortness of breath with exertion,  [x]  pain in feet when walking, []  pain in feet when laying flat, []  history of blood clot in veins (DVT), []  history of phlebitis, []  swelling in legs, []  varicose veins  PULMONARY:  []  productive cough, []  asthma, [x]  wheezing  NEUROLOGIC:  []  weakness in arms or legs, []  numbness in arms or legs, []  difficulty speaking or slurred speech, []  temporary loss of vision in one eye, []  dizziness  HEMATOLOGIC:  []  bleeding problems, []  problems with blood clotting too easily  MUSCULOSKEL:  []  joint pain, []  joint swelling  GASTROINTEST:  []  vomiting blood, []  blood in stool     GENITOURINARY:  []  burning with urination, []  blood in urine  PSYCHIATRIC:  []  history of major depression  INTEGUMENTARY:  []   rashes, []  ulcers  CONSTITUTIONAL:  []  fever, []  chills  Physical Examination  Filed Vitals:   07/20/13 1131  BP: 163/73  Pulse: 87  Height: 5\' 4"  (1.626 m)  Weight: 211 lb 8 oz (95.936 kg)  SpO2: 99%   Body mass index is 36.29 kg/(m^2).  General: A&O x 3, WD, Obese,   Head: [place]/AT  Ear/Nose/Throat: Hearing grossly intact, nares w/o erythema or drainage, oropharynx w/o Erythema/Exudate, Mallampati score: 3  Eyes: PERRLA, EOMI  Neck: Supple, no nuchal rigidity, no palpable LAD  Pulmonary: Sym exp, good air movt, CTAB, no rales, rhonchi, & wheezing  Cardiac: RRR, Nl S1, S2, no Murmurs, rubs or gallops  Vascular: Vessel Right Left  Radial Palpable Palpable  Ulnar Palpable Palpable  Brachial Palpable Palpable  Carotid Palpable, without bruit Palpable, without bruit  Aorta Not palpable N/A  Femoral Palpable Palpable  Popliteal Not palpable Not palpable  PT Not Palpable Not Palpable  DP Faintly Palpable Faintly Palpable   Gastrointestinal: soft, NTND, -G/R, - HSM, - masses, - CVAT B  Musculoskeletal: M/S 5/5 throughout , Extremities without ischemic changes , no palpable thrill, healed incision c/w prior procedures  Neurologic: CN 2-12 intact , Pain and light touch intact in extremities , Motor exam as listed above  Psychiatric: Judgment intact, Mood & affect appropriate for pt's clinical situation  Dermatologic: See M/S exam for extremity exam, no rashes otherwise noted  Lymph : No Cervical, Axillary, or Inguinal lymphadenopathy   Non-Invasive Vascular Imaging  Vein Mapping  (Date: 07/20/2013):   R arm: acceptable vein conduits include upper arm cephalic and basilic vein  L arm: acceptable vein conduits include none  L arm access duplex (07/20/2013)  Occluded upper arm fistula  Outside Studies/Documentation 5 pages of outside documents were reviewed including: outpatient nephrology clinic chart.  Medical Decision Making  Joanne Carlson is a 68 y.o.  female who presents with ESRD requiring hemodialysis.   Based on vein mapping and examination, this patient's permanent access options include: R BC AVF vs stage R BVT.  I had an extensive discussion with this patient in regards to the nature of access surgery, including risk, benefits, and alternatives.    The patient is aware that the risks of access surgery include but are not limited to: bleeding, infection, steal syndrome, nerve damage, ischemic monomelic neuropathy, failure of access to mature, and possible need for additional  access procedures in the future.  I discussed with the patient the nature of the staged access procedure, specifically the need for a second operation to transpose the first stage fistula if it matures adequately.    The patient is reluctant to proceed.  Patient is going to discuss things with her nephrologist prior to proceeding.Adele Barthel, MD [facility] Office: [phone] Pager: [phone]  07/20/2013, 3:15 PM

## 2013-07-23 ENCOUNTER — Encounter (HOSPITAL_COMMUNITY)
Admission: RE | Admit: 2013-07-23 | Discharge: 2013-07-23 | Disposition: A | Discharge status: Home / Self Care | Payer: Medicare Other | Source: Ambulatory Visit | Attending: Nephrology | Admitting: Nephrology

## 2013-07-23 DIAGNOSIS — E119 Type 2 diabetes mellitus without complications: Secondary | ICD-10-CM | POA: Diagnosis not present

## 2013-07-23 DIAGNOSIS — J309 Allergic rhinitis, unspecified: Secondary | ICD-10-CM | POA: Diagnosis not present

## 2013-07-23 DIAGNOSIS — N259 Disorder resulting from impaired renal tubular function, unspecified: Secondary | ICD-10-CM | POA: Diagnosis not present

## 2013-07-23 DIAGNOSIS — D649 Anemia, unspecified: Secondary | ICD-10-CM | POA: Insufficient documentation

## 2013-07-23 DIAGNOSIS — I10 Essential (primary) hypertension: Secondary | ICD-10-CM | POA: Insufficient documentation

## 2013-07-23 DIAGNOSIS — E7800 Pure hypercholesterolemia, unspecified: Secondary | ICD-10-CM | POA: Insufficient documentation

## 2013-07-23 LAB — POCT HEMOGLOBIN-HEMACUE: Hemoglobin: 10.2 g/dL — ABNORMAL LOW (ref 12.0–15.0)

## 2013-07-23 LAB — IRON AND TIBC
Iron: 71 ug/dL (ref 42–135)
Saturation Ratios: 31 % (ref 20–55)
TIBC: 226 ug/dL — ABNORMAL LOW (ref 250–470)
UIBC: 155 ug/dL (ref 125–400)

## 2013-07-23 LAB — FERRITIN: FERRITIN: 348 ng/mL — AB (ref 10–291)

## 2013-07-23 MED ORDER — EPOETIN ALFA 20000 UNIT/ML IJ SOLN
INTRAMUSCULAR | Status: AC
  Administered 2013-07-23: 20000 [IU] via SUBCUTANEOUS
  Filled 2013-07-23: qty 1

## 2013-07-23 MED ORDER — EPOETIN ALFA 10000 UNIT/ML IJ SOLN
INTRAMUSCULAR | Status: AC
  Administered 2013-07-23: 10000 [IU] via SUBCUTANEOUS
  Filled 2013-07-23: qty 1

## 2013-07-23 MED ORDER — EPOETIN ALFA 10000 UNIT/ML IJ SOLN: 30000.0000 [IU] | INTRAMUSCULAR | Status: DC

## 2013-08-20 ENCOUNTER — Encounter (HOSPITAL_COMMUNITY): Payer: Medicare Other

## 2013-08-30 ENCOUNTER — Other Ambulatory Visit: Payer: Self-pay | Admitting: Physician Assistant

## 2013-09-05 DIAGNOSIS — E119 Type 2 diabetes mellitus without complications: Secondary | ICD-10-CM | POA: Diagnosis not present

## 2013-09-05 DIAGNOSIS — R209 Unspecified disturbances of skin sensation: Secondary | ICD-10-CM | POA: Diagnosis not present

## 2013-09-11 DIAGNOSIS — D631 Anemia in chronic kidney disease: Secondary | ICD-10-CM | POA: Diagnosis not present

## 2013-09-11 DIAGNOSIS — D649 Anemia, unspecified: Secondary | ICD-10-CM | POA: Diagnosis not present

## 2013-09-11 DIAGNOSIS — N390 Urinary tract infection, site not specified: Secondary | ICD-10-CM | POA: Diagnosis not present

## 2013-09-11 DIAGNOSIS — N185 Chronic kidney disease, stage 5: Secondary | ICD-10-CM | POA: Diagnosis not present

## 2013-09-11 DIAGNOSIS — N2581 Secondary hyperparathyroidism of renal origin: Secondary | ICD-10-CM | POA: Diagnosis not present

## 2013-09-11 DIAGNOSIS — N039 Chronic nephritic syndrome with unspecified morphologic changes: Secondary | ICD-10-CM | POA: Diagnosis not present

## 2013-09-11 DIAGNOSIS — M109 Gout, unspecified: Secondary | ICD-10-CM | POA: Diagnosis not present

## 2013-09-11 DIAGNOSIS — I129 Hypertensive chronic kidney disease with stage 1 through stage 4 chronic kidney disease, or unspecified chronic kidney disease: Secondary | ICD-10-CM | POA: Diagnosis not present

## 2013-09-13 ENCOUNTER — Other Ambulatory Visit: Payer: Self-pay | Admitting: *Deleted

## 2013-09-20 ENCOUNTER — Encounter (HOSPITAL_COMMUNITY): Payer: Self-pay | Admitting: Pharmacy Technician

## 2013-09-25 ENCOUNTER — Encounter (HOSPITAL_COMMUNITY)
Admission: RE | Admit: 2013-09-25 | Discharge: 2013-09-25 | Disposition: A | Discharge status: Home / Self Care | Payer: Medicare Other | Source: Ambulatory Visit | Attending: Nephrology | Admitting: Nephrology

## 2013-09-25 ENCOUNTER — Encounter (HOSPITAL_COMMUNITY): Payer: Self-pay

## 2013-09-25 ENCOUNTER — Encounter (HOSPITAL_COMMUNITY)
Admission: RE | Admit: 2013-09-25 | Discharge: 2013-09-25 | Disposition: A | Discharge status: Home / Self Care | Payer: Medicare Other | Source: Ambulatory Visit | Attending: Anesthesiology | Admitting: Anesthesiology

## 2013-09-25 ENCOUNTER — Encounter (HOSPITAL_COMMUNITY)
Admission: RE | Admit: 2013-09-25 | Discharge: 2013-09-25 | Disposition: A | Discharge status: Home / Self Care | Payer: Medicare Other | Source: Ambulatory Visit | Attending: Vascular Surgery | Admitting: Vascular Surgery

## 2013-09-25 DIAGNOSIS — E119 Type 2 diabetes mellitus without complications: Secondary | ICD-10-CM | POA: Insufficient documentation

## 2013-09-25 DIAGNOSIS — Z01810 Encounter for preprocedural cardiovascular examination: Secondary | ICD-10-CM | POA: Insufficient documentation

## 2013-09-25 DIAGNOSIS — Z01818 Encounter for other preprocedural examination: Secondary | ICD-10-CM | POA: Diagnosis not present

## 2013-09-25 DIAGNOSIS — J309 Allergic rhinitis, unspecified: Secondary | ICD-10-CM | POA: Diagnosis not present

## 2013-09-25 DIAGNOSIS — I10 Essential (primary) hypertension: Secondary | ICD-10-CM | POA: Insufficient documentation

## 2013-09-25 DIAGNOSIS — Z01812 Encounter for preprocedural laboratory examination: Secondary | ICD-10-CM | POA: Insufficient documentation

## 2013-09-25 DIAGNOSIS — N259 Disorder resulting from impaired renal tubular function, unspecified: Secondary | ICD-10-CM | POA: Diagnosis not present

## 2013-09-25 DIAGNOSIS — D649 Anemia, unspecified: Secondary | ICD-10-CM | POA: Diagnosis not present

## 2013-09-25 DIAGNOSIS — E7800 Pure hypercholesterolemia, unspecified: Secondary | ICD-10-CM | POA: Diagnosis not present

## 2013-09-25 HISTORY — DX: Unspecified osteoarthritis, unspecified site: M19.90

## 2013-09-25 HISTORY — DX: Pain in unspecified joint: M25.50

## 2013-09-25 HISTORY — DX: Dizziness and giddiness: R42

## 2013-09-25 HISTORY — DX: Personal history of other diseases of the musculoskeletal system and connective tissue: Z87.39

## 2013-09-25 HISTORY — DX: Polyneuropathy, unspecified: G62.9

## 2013-09-25 HISTORY — DX: Personal history of colonic polyps: Z86.010

## 2013-09-25 HISTORY — DX: Essential (primary) hypertension: I10

## 2013-09-25 HISTORY — DX: Hyperlipidemia, unspecified: E78.5

## 2013-09-25 HISTORY — DX: Personal history of colon polyps, unspecified: Z86.0100

## 2013-09-25 HISTORY — DX: Pneumonia, unspecified organism: J18.9

## 2013-09-25 LAB — IRON AND TIBC
Iron: 61 ug/dL (ref 42–135)
Saturation Ratios: 26 % (ref 20–55)
TIBC: 238 ug/dL — AB (ref 250–470)
UIBC: 177 ug/dL (ref 125–400)

## 2013-09-25 LAB — FERRITIN: Ferritin: 332 ng/mL — ABNORMAL HIGH (ref 10–291)

## 2013-09-25 LAB — NO BLOOD PRODUCTS

## 2013-09-25 MED ORDER — EPOETIN ALFA 10000 UNIT/ML IJ SOLN: 20000.0000 [IU] | INTRAMUSCULAR | Status: DC

## 2013-09-25 MED ORDER — CHLORHEXIDINE GLUCONATE CLOTH 2 % EX PADS: 6.0000 | MEDICATED_PAD | Freq: Once | CUTANEOUS | Status: DC

## 2013-09-25 MED ORDER — EPOETIN ALFA 20000 UNIT/ML IJ SOLN
INTRAMUSCULAR | Status: AC
  Administered 2013-09-25: 20000 [IU] via SUBCUTANEOUS
  Filled 2013-09-25: qty 1

## 2013-09-25 NOTE — Progress Notes (Addendum)
Pt doesn't have a cardiologist  Stress test done at [facility] around 2011 and to be requested  Echo done at [facility] around 2011-to be requested  Denies ever having a heart cath  Denies EKG or CXR in past yr    Medical Md is Dr.James Deterding

## 2013-09-25 NOTE — Pre-Procedure Instructions (Signed)
Raniah Maron Gathers  09/25/2013   Your procedure is scheduled on:  Wed, April 1 @ 8:30 AM  Report to [facility]  at 6:30 AM.  Call this number if you have problems the morning of surgery: [phone]   Remember:   Do not eat food or drink liquids after midnight.   Take these medicines the morning of surgery with A SIP OF WATER: Allopurinol(Zyloprim),Gabapentin(Neurontin),and Pregabalin(Lyrica)              Stop taking your Aspirin. No Goody's,BC's,Aleve,Ibuprofen,Fish Oil,or any Herbal Medications   Do not wear jewelry, make-up or nail polish.  Do not wear lotions, powders, or perfumes. You may wear deodorant.  Do not shave 48 hours prior to surgery.   Do not bring valuables to the hospital.  [facility] is not responsible                  for any belongings or valuables.               Contacts, dentures or bridgework may not be worn into surgery.  Leave suitcase in the car. After surgery it may be brought to your room.  For patients admitted to the hospital, discharge time is determined by your                treatment team.               Patients discharged the day of surgery will not be allowed to drive  home.    Special Instructions:  [facility] - Preparing for Surgery  Before surgery, you can play an important role.  Because skin is not sterile, your skin needs to be as free of germs as possible.  You can reduce the number of germs on you skin by washing with CHG (chlorahexidine gluconate) soap before surgery.  CHG is an antiseptic cleaner which kills germs and bonds with the skin to continue killing germs even after washing.  Please DO NOT use if you have an allergy to CHG or antibacterial soaps.  If your skin becomes reddened/irritated stop using the CHG and inform your nurse when you arrive at Short Stay.  Do not shave (including legs and underarms) for at least 48 hours prior to the first CHG shower.  You may shave your face.  Please follow these instructions  carefully:   1.  Shower with CHG Soap the night before surgery and the                                morning of Surgery.  2.  If you choose to wash your hair, wash your hair first as usual with your       normal shampoo.  3.  After you shampoo, rinse your hair and body thoroughly to remove the                      Shampoo.  4.  Use CHG as you would any other liquid soap.  You can apply chg directly       to the skin and wash gently with scrungie or a clean washcloth.  5.  Apply the CHG Soap to your body ONLY FROM THE NECK DOWN.        Do not use on open wounds or open sores.  Avoid contact with your eyes,       ears, mouth and  genitals (private parts).  Wash genitals (private parts)       with your normal soap.  6.  Wash thoroughly, paying special attention to the area where your surgery        will be performed.  7.  Thoroughly rinse your body with warm water from the neck down.  8.  DO NOT shower/wash with your normal soap after using and rinsing off       the CHG Soap.  9.  Pat yourself dry with a clean towel.            10.  Wear clean pajamas.            11.  Place clean sheets on your bed the night of your first shower and do not        sleep with pets.  Day of Surgery  Do not apply any lotions/deoderants the morning of surgery.  Please wear clean clothes to the hospital/surgery center.     Please read over the following fact sheets that you were given: Pain Booklet, Coughing and Deep Breathing and Surgical Site Infection Prevention

## 2013-09-26 LAB — POCT HEMOGLOBIN-HEMACUE: HEMOGLOBIN: 9.2 g/dL — AB (ref 12.0–15.0)

## 2013-09-26 NOTE — Progress Notes (Signed)
Anesthesia Chart Review:  Patient is a 68 year old female scheduled for right brachiocephalic AVF versus BVT on 10/03/13 by Dr. Bridgett Larsson.  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  BMI is 35.7 consistent with obesity. Nephrologist is Dr. Jimmy Footman.  His notes from 06/08/13 indicate that she was noted to have a tumor in one of her kidneys during evaluation for renal transplant.  EKG on 09/25/13 showed SR with PACs, low voltage QRS, cannot rule out anterior infarct (age undetermined).  It was not felt significantly changed since previous tracing on 12/23/09 (see Muse).  Stress echo on 10/11/11 [facility]) showed no inducible LV wall motion abnormalities indicative of ischemia. Echo portion showed borderline concentric left ventricular hypertrophy, EF 62%, impaired LV filling pattern, aortic sclerosis, trace tricuspid regurgitation.  CXR on 09/25/13 showed no active cardiopulmonary disease.  She is for ISTAT on the day of surgery. She signed a refusal for all blood products.   She had negative stress echo within the last two years.  Overall EKG was felt stable.  If same day labs are acceptable then I would anticipate that she could proceed as planned.  George Hugh [facility] Short Stay Center/Anesthesiology Phone [phone] 09/27/2013 9:58 AM

## 2013-10-02 MED ORDER — DEXTROSE 5 % IV SOLN
1.5000 g | INTRAVENOUS | Status: AC
  Administered 2013-10-03: 1.5 g via INTRAVENOUS
  Filled 2013-10-02: qty 1.5

## 2013-10-02 MED ORDER — SODIUM CHLORIDE 0.9 % IV SOLN
INTRAVENOUS | Status: DC
  Administered 2013-10-03: 09:00:00 via INTRAVENOUS

## 2013-10-02 NOTE — Progress Notes (Signed)
Message left for pt. To arrive at 8:30 am.

## 2013-10-03 ENCOUNTER — Encounter (HOSPITAL_COMMUNITY): Payer: Medicare Other | Admitting: Vascular Surgery

## 2013-10-03 ENCOUNTER — Encounter (HOSPITAL_COMMUNITY)
Admission: RE | Disposition: A | Discharge status: Home / Self Care | Payer: Self-pay | Source: Ambulatory Visit | Attending: Vascular Surgery

## 2013-10-03 ENCOUNTER — Ambulatory Visit (HOSPITAL_COMMUNITY)
Admission: RE | Admit: 2013-10-03 | Discharge: 2013-10-03 | Disposition: A | Discharge status: Home / Self Care | Payer: Medicare Other | Source: Ambulatory Visit | Attending: Vascular Surgery | Admitting: Vascular Surgery

## 2013-10-03 ENCOUNTER — Ambulatory Visit (HOSPITAL_COMMUNITY): Payer: Medicare Other | Admitting: Anesthesiology

## 2013-10-03 ENCOUNTER — Encounter (HOSPITAL_COMMUNITY): Payer: Self-pay | Admitting: Anesthesiology

## 2013-10-03 DIAGNOSIS — N186 End stage renal disease: Secondary | ICD-10-CM | POA: Insufficient documentation

## 2013-10-03 DIAGNOSIS — Z87891 Personal history of nicotine dependence: Secondary | ICD-10-CM | POA: Diagnosis not present

## 2013-10-03 DIAGNOSIS — N259 Disorder resulting from impaired renal tubular function, unspecified: Secondary | ICD-10-CM

## 2013-10-03 DIAGNOSIS — I739 Peripheral vascular disease, unspecified: Secondary | ICD-10-CM | POA: Diagnosis not present

## 2013-10-03 DIAGNOSIS — E785 Hyperlipidemia, unspecified: Secondary | ICD-10-CM | POA: Insufficient documentation

## 2013-10-03 DIAGNOSIS — I12 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 stage renal disease: Secondary | ICD-10-CM | POA: Diagnosis not present

## 2013-10-03 DIAGNOSIS — E119 Type 2 diabetes mellitus without complications: Secondary | ICD-10-CM | POA: Insufficient documentation

## 2013-10-03 DIAGNOSIS — Z7982 Long term (current) use of aspirin: Secondary | ICD-10-CM | POA: Diagnosis not present

## 2013-10-03 DIAGNOSIS — I10 Essential (primary) hypertension: Secondary | ICD-10-CM | POA: Diagnosis not present

## 2013-10-03 DIAGNOSIS — N184 Chronic kidney disease, stage 4 (severe): Secondary | ICD-10-CM | POA: Diagnosis not present

## 2013-10-03 DIAGNOSIS — Z8673 Personal history of transient ischemic attack (TIA), and cerebral infarction without residual deficits: Secondary | ICD-10-CM | POA: Insufficient documentation

## 2013-10-03 DIAGNOSIS — G609 Hereditary and idiopathic neuropathy, unspecified: Secondary | ICD-10-CM | POA: Diagnosis not present

## 2013-10-03 HISTORY — PX: BASCILIC VEIN TRANSPOSITION: SHX5742

## 2013-10-03 LAB — POCT I-STAT 4, (NA,K, GLUC, HGB,HCT)
Glucose, Bld: 98 mg/dL (ref 70–99)
HEMATOCRIT: 32 % — AB (ref 36.0–46.0)
HEMOGLOBIN: 10.9 g/dL — AB (ref 12.0–15.0)
Potassium: 4.4 mEq/L (ref 3.7–5.3)
Sodium: 139 mEq/L (ref 137–147)

## 2013-10-03 LAB — GLUCOSE, CAPILLARY: GLUCOSE-CAPILLARY: 92 mg/dL (ref 70–99)

## 2013-10-03 SURGERY — TRANSPOSITION, VEIN, BASILIC
Anesthesia: Monitor Anesthesia Care | Site: Arm Upper | Laterality: Right

## 2013-10-03 MED ORDER — SODIUM CHLORIDE 0.9 % IR SOLN
Status: DC | PRN
  Administered 2013-10-03: 11:00:00

## 2013-10-03 MED ORDER — LIDOCAINE-EPINEPHRINE (PF) 1 %-1:200000 IJ SOLN
INTRAMUSCULAR | Status: DC | PRN
  Administered 2013-10-03: 30 mL

## 2013-10-03 MED ORDER — OXYCODONE HCL 5 MG PO TABS: 5.0000 mg | ORAL_TABLET | Freq: Four times a day (QID) | ORAL | Status: DC | PRN

## 2013-10-03 MED ORDER — OXYCODONE HCL 5 MG PO TABS
ORAL_TABLET | ORAL | Status: AC
  Administered 2013-10-03: 5 mg via ORAL
  Filled 2013-10-03: qty 1

## 2013-10-03 MED ORDER — OXYCODONE HCL 5 MG PO TABS: 5.0000 mg | ORAL_TABLET | ORAL | Status: DC | PRN

## 2013-10-03 MED ORDER — 0.9 % SODIUM CHLORIDE (POUR BTL) OPTIME
TOPICAL | Status: DC | PRN
  Administered 2013-10-03: 1000 mL

## 2013-10-03 MED ORDER — OXYCODONE HCL 5 MG PO TABS
5.0000 mg | ORAL_TABLET | Freq: Once | ORAL | Status: AC
  Administered 2013-10-03: 5 mg via ORAL

## 2013-10-03 MED ORDER — FENTANYL CITRATE 0.05 MG/ML IJ SOLN
INTRAMUSCULAR | Status: DC | PRN
  Administered 2013-10-03: 50 ug via INTRAVENOUS
  Administered 2013-10-03: 25 ug via INTRAVENOUS
  Administered 2013-10-03: 50 ug via INTRAVENOUS

## 2013-10-03 MED ORDER — FENTANYL CITRATE 0.05 MG/ML IJ SOLN
INTRAMUSCULAR | Status: AC
  Filled 2013-10-03: qty 5

## 2013-10-03 MED ORDER — LIDOCAINE HCL (CARDIAC) 10 MG/ML IV SOLN
INTRAVENOUS | Status: DC | PRN
  Administered 2013-10-03: 80 mg via INTRAVENOUS

## 2013-10-03 MED ORDER — HYDROMORPHONE HCL PF 1 MG/ML IJ SOLN: 0.2500 mg | INTRAMUSCULAR | Status: DC | PRN

## 2013-10-03 MED ORDER — ONDANSETRON HCL 4 MG/2ML IJ SOLN: 4.0000 mg | Freq: Once | INTRAMUSCULAR | Status: DC | PRN

## 2013-10-03 MED ORDER — PROPOFOL 10 MG/ML IV BOLUS
INTRAVENOUS | Status: DC | PRN
  Administered 2013-10-03: 30 mg via INTRAVENOUS
  Administered 2013-10-03 (×2): 20 mg via INTRAVENOUS
  Administered 2013-10-03: 30 mg via INTRAVENOUS
  Administered 2013-10-03: 20 mg via INTRAVENOUS

## 2013-10-03 MED ORDER — PROPOFOL 10 MG/ML IV BOLUS
INTRAVENOUS | Status: AC
  Filled 2013-10-03: qty 20

## 2013-10-03 MED ORDER — OXYBUTYNIN CHLORIDE 5 MG PO TABS: 5.0000 mg | ORAL_TABLET | Freq: Once | ORAL | Status: DC

## 2013-10-03 MED ORDER — SODIUM CHLORIDE 0.9 % IV SOLN
INTRAVENOUS | Status: DC | PRN
  Administered 2013-10-03: 09:00:00 via INTRAVENOUS

## 2013-10-03 SURGICAL SUPPLY — 32 items
ADH SKN CLS APL DERMABOND .7 (GAUZE/BANDAGES/DRESSINGS) ×1
CANISTER SUCTION 2500CC (MISCELLANEOUS) ×3 IMPLANT
CLIP TI MEDIUM 24 (CLIP) ×3 IMPLANT
CLIP TI WIDE RED SMALL 24 (CLIP) ×3 IMPLANT
COVER PROBE W GEL 5X96 (DRAPES) ×2 IMPLANT
COVER SURGICAL LIGHT HANDLE (MISCELLANEOUS) ×3 IMPLANT
DERMABOND ADVANCED (GAUZE/BANDAGES/DRESSINGS) ×2
DERMABOND ADVANCED .7 DNX12 (GAUZE/BANDAGES/DRESSINGS) ×1 IMPLANT
ELECT REM PT RETURN 9FT ADLT (ELECTROSURGICAL) ×3
ELECTRODE REM PT RTRN 9FT ADLT (ELECTROSURGICAL) ×1 IMPLANT
GEL ULTRASOUND 20GR AQUASONIC (MISCELLANEOUS) ×2 IMPLANT
GLOVE BIO SURGEON STRL SZ 6.5 (GLOVE) ×1 IMPLANT
GLOVE BIO SURGEONS STRL SZ 6.5 (GLOVE) ×1
GLOVE BIOGEL PI IND STRL 7.0 (GLOVE) IMPLANT
GLOVE BIOGEL PI INDICATOR 7.0 (GLOVE) ×4
GLOVE SS BIOGEL STRL SZ 7 (GLOVE) ×1 IMPLANT
GLOVE SUPERSENSE BIOGEL SZ 7 (GLOVE) ×2
GOWN STRL REUS W/ TWL LRG LVL3 (GOWN DISPOSABLE) ×3 IMPLANT
GOWN STRL REUS W/TWL LRG LVL3 (GOWN DISPOSABLE) ×9
KIT BASIN OR (CUSTOM PROCEDURE TRAY) ×3 IMPLANT
KIT ROOM TURNOVER OR (KITS) ×3 IMPLANT
NS IRRIG 1000ML POUR BTL (IV SOLUTION) ×3 IMPLANT
PACK CV ACCESS (CUSTOM PROCEDURE TRAY) ×3 IMPLANT
PAD ARMBOARD 7.5X6 YLW CONV (MISCELLANEOUS) ×6 IMPLANT
SUT PROLENE 6 0 BV (SUTURE) ×3 IMPLANT
SUT SILK 2 0 SH (SUTURE) ×3 IMPLANT
SUT VIC AB 3-0 SH 27 (SUTURE) ×3
SUT VIC AB 3-0 SH 27X BRD (SUTURE) ×1 IMPLANT
TOWEL OR 17X24 6PK STRL BLUE (TOWEL DISPOSABLE) ×3 IMPLANT
TOWEL OR 17X26 10 PK STRL BLUE (TOWEL DISPOSABLE) ×3 IMPLANT
UNDERPAD 30X30 INCONTINENT (UNDERPADS AND DIAPERS) ×3 IMPLANT
WATER STERILE IRR 1000ML POUR (IV SOLUTION) ×3 IMPLANT

## 2013-10-03 NOTE — Transfer of Care (Signed)
Immediate Anesthesia Transfer of Care Note  Patient: Joanne Carlson  Procedure(s) Performed: Procedure(s): BRACHIOCEPHALIC Arteriovenous Fistula  (Right)  Patient Location: PACU  Anesthesia Type:MAC  Level of Consciousness: awake, alert  and oriented  Airway & Oxygen Therapy: Patient Spontanous Breathing and Patient connected to nasal cannula oxygen  Post-op Assessment: Report given to PACU RN and Post -op Vital signs reviewed and stable  Post vital signs: Reviewed and stable  Complications: No apparent anesthesia complications

## 2013-10-03 NOTE — Progress Notes (Signed)
Discussed with Patient and daughter what too feel for with the thrill to the incision site. Both verbalize and understanding of what to look for.

## 2013-10-03 NOTE — Anesthesia Postprocedure Evaluation (Signed)
  Anesthesia Post-op Note  Patient: Joanne Carlson  Procedure(s) Performed: Procedure(s): BRACHIOCEPHALIC Arteriovenous Fistula  (Right)  Patient Location: PACU  Anesthesia Type:MAC  Level of Consciousness: awake, alert , oriented and patient cooperative  Airway and Oxygen Therapy: Patient Spontanous Breathing  Post-op Pain: mild  Post-op Assessment: Post-op Vital signs reviewed, Patient's Cardiovascular Status Stable, Respiratory Function Stable, Patent Airway, No signs of Nausea or vomiting and Pain level controlled  Post-op Vital Signs: stable  Complications: No apparent anesthesia complications

## 2013-10-03 NOTE — Op Note (Signed)
OPERATIVE REPORT  Date of Surgery: 10/03/2013  Surgeon: Tinnie Gens, MD  Assistant: Nurse  Pre-op Diagnosis: ESRD  Post-op Diagnosis: ESRD  Procedure: Procedure(s): Creation right brachial-cephalic AV fistula  Anesthesia: MAC  EBL: Minimal  Complications: None  Procedure Details: Patient was taken to the operating room placed in supine position at which time the right upper treatment veins were imaged with B-mode ultrasound using the sono site. The cephalic vein in the upper arm appeared adequate for fistula creation consistent with preoperative vein mapping. After prepping and draping in routine sterile manner an infiltration of 1% Xylocaine with epinephrine transverse incision was made in the antecubital area. Antecubital vein was dissected free. The basilic vein was carefully preserved the cephalic was ligated at its junction with the basilic vein. It was gently dilated with heparinized saline and was an excellent vein 3-3-1/2 mm in size in the antecubital area. Brachial artery was exposed in a fashion circle vessel loops. An excellent pulse was 3 mm in size. It was occluded proximally and distally opened with 15 blade and extended with Potts scissors. Cephalic vein was then anastomosed end to side to the brachial artery with 6-0 Prolene. Clamps were released there was a good pulse and palpable thrill in the upper arm. It was then imaged with the ultrasound to look for large branches and none were identified there was excellent flow in the fistula. There was good radial flow distally with the fistula open and occluded. Adequate hemostasis was achieved the wound was closed in layers with Vicryl in a subcuticular fashion with Dermabond the patient taken to the recovery room satisfactory condition   Tinnie Gens, MD 10/03/2013 11:34 AM

## 2013-10-03 NOTE — H&P (Signed)
Vascular Surgery H&P  Chief Complaint: Needs vascular access  HPI: Joanne Carlson is a 68 y.o. female who presents for evaluation of needs vascular access. Patient has renal insufficiency but not yet on hemodialysis. She has had 2 failed access procedures in the left upper extremity over the past 2 years. She is right-handed.   Past Medical History  Diagnosis Date  . Chronic kidney disease   . Stroke   . Peripheral vascular disease   . History of gout     takes Allopurinol daily  . Hypertension     takes Monopril daily  . Diabetes mellitus     takes Glipizide daily  . Hyperlipidemia     takes Pravastatin daily  . Pneumonia     hx of-yrs ago  . Dizziness     occasionally  . Peripheral neuropathy   . Arthritis   . Joint pain   . History of colon polyps    Past Surgical History  Procedure Laterality Date  . Appendectomy    . Tubal ligation    . Rotator cuff surgery Right 2013  . Colonoscopy    . Esophagogastroduodenoscopy    . Cyst removed from lower abdomen  80's   History   Social History  . Marital Status: Single    Spouse Name: N/A    Number of Children: N/A  . Years of Education: N/A   Social History Main Topics  . Smoking status: Former Smoker -- 10 years    Types: Cigarettes  . Smokeless tobacco: Never Used     Comment: quit smoking over 30+yrs ago  . Alcohol Use: No  . Drug Use: No  . Sexual Activity: No   Other Topics Concern  . None   Social History Narrative  . None   Family History  Problem Relation Age of Onset  . Depression Mother   . Hypertension Mother   . Depression Father   . Depression Sister   . Hypertension Sister    Allergies  Allergen Reactions  . Tylenol [Acetaminophen]     Rash   Prior to Admission medications   Medication Sig Start Date End Date Taking? Authorizing Provider  allopurinol (ZYLOPRIM) 100 MG tablet Take 100 mg by mouth daily.  05/25/13  Yes Historical Provider, MD  aspirin EC 81 MG tablet Take 81 mg  by mouth daily.   Yes Historical Provider, MD  calcitRIOL (ROCALTROL) 0.5 MCG capsule Take 0.5 mcg by mouth daily.   Yes Historical Provider, MD  calcium acetate (PHOSLO) 667 MG capsule Take 667 mg by mouth 3 (three) times daily with meals.  06/22/13  Yes Historical Provider, MD  cinacalcet (SENSIPAR) 30 MG tablet Take 30 mg by mouth daily.   Yes Historical Provider, MD  fosinopril (MONOPRIL) 20 MG tablet Take 30 mg by mouth daily. Take 1 1/2 qd   Yes Historical Provider, MD  furosemide (LASIX) 80 MG tablet Take 80 mg by mouth 2 (two) times daily.   Yes Historical Provider, MD  gabapentin (NEURONTIN) 100 MG capsule Take 1 capsule by mouth every morning and 1 capsule at bedtime. PATIENT NEEDS OFFICE VISIT FOR ADDITIONAL REFILLS 05/25/13  Yes Heather M Marte, PA-C  glipiZIDE (GLUCOTROL) 10 MG tablet Take 10 mg by mouth daily.   Yes Historical Provider, MD  glucose blood test strip Use to test blood sugar daily. Dx code: 250.02. 11/06/12  Yes Theda Sers, PA-C  Lancets MISC Use to test blood sugar daily. Dx code [id number]. 11/08/12  Yes Collene Leyden, PA-C  methocarbamol (ROBAXIN) 500 MG tablet Take 500 mg by mouth every 6 (six) hours as needed.    Yes Historical Provider, MD  pravastatin (PRAVACHOL) 20 MG tablet Take 20 mg by mouth daily.   Yes Historical Provider, MD  pregabalin (LYRICA) 50 MG capsule Take 50 mg by mouth daily.   Yes Historical Provider, MD     Positive ROS: Denies any active chest pain, dyspnea on exertion, PND, orthopnea, hemoptysis`  All other systems have been reviewed and were otherwise negative with the exception of those mentioned in the HPI and as above.  Physical Exam: Filed Vitals:   10/03/13 0840  BP: 157/76  Pulse: 72  Temp: 98.5 F (36.9 C)  Resp: 18    General: Alert, no acute distress HEENT: Normal for age Cardiovascular: Regular rate and rhythm. Carotid pulses 2+, no bruits audible Respiratory: Clear to auscultation. No cyanosis, no use of accessory  musculature GI: No organomegaly, abdomen is soft and non-tender Skin: No lesions in the area of chief complaint Neurologic: Sensation intact distally Psychiatric: Patient is competent for consent with normal mood and affect Musculoskeletal: No obvious deformities Extremities: Right upper extremity with 3+ brachial and radial pulse palpable.    Imaging reviewed: Vein mapping reviewed it appears that the right upper arm cephalic vein will be satisfactory for brachial to cephalic A-V fistula   Assessment/Plan:  Right brachial cephalic AV fistula or basilic vein transposition depending on findings at time of surgery Discusses with patient and she is agreeable   Tinnie Gens, MD 10/03/2013 10:27 AM

## 2013-10-03 NOTE — Progress Notes (Signed)
Patient was instructed of her pain medication she will be going home with.  She was ordered her first does here so that her pain could be better managed. Patient verbalizes an understanding.

## 2013-10-03 NOTE — Preoperative (Signed)
Beta Blockers   Reason not to administer Beta Blockers:Not Applicable 

## 2013-10-03 NOTE — Anesthesia Preprocedure Evaluation (Signed)
Anesthesia Evaluation  Patient identified by MRN, date of birth, ID band Patient awake    Reviewed: Allergy & Precautions, H&P , NPO status , Patient's Chart, lab work & pertinent test results  Airway       Dental   Pulmonary former smoker,          Cardiovascular hypertension, + Peripheral Vascular Disease     Neuro/Psych  Neuromuscular disease CVA    GI/Hepatic   Endo/Other  diabetes, Type 2  Renal/GU CRF, Dialysis and ESRFRenal disease     Musculoskeletal   Abdominal   Peds  Hematology  (+) anemia ,   Anesthesia Other Findings   Reproductive/Obstetrics                           Anesthesia Physical Anesthesia Plan  ASA: III  Anesthesia Plan: MAC and General   Post-op Pain Management:    Induction: Intravenous  Airway Management Planned: Simple Face Mask and LMA  Additional Equipment:   Intra-op Plan:   Post-operative Plan:   Informed Consent: I have reviewed the patients History and Physical, chart, labs and discussed the procedure including the risks, benefits and alternatives for the proposed anesthesia with the patient or authorized representative who has indicated his/her understanding and acceptance.     Plan Discussed with:   Anesthesia Plan Comments:         Anesthesia Quick Evaluation

## 2013-10-04 ENCOUNTER — Encounter (HOSPITAL_COMMUNITY): Payer: Self-pay | Admitting: Vascular Surgery

## 2013-10-04 ENCOUNTER — Other Ambulatory Visit: Payer: Self-pay | Admitting: *Deleted

## 2013-10-04 DIAGNOSIS — Z4931 Encounter for adequacy testing for hemodialysis: Secondary | ICD-10-CM

## 2013-10-04 DIAGNOSIS — N186 End stage renal disease: Secondary | ICD-10-CM

## 2013-10-08 ENCOUNTER — Telehealth: Payer: Self-pay | Admitting: *Deleted

## 2013-10-08 ENCOUNTER — Other Ambulatory Visit (HOSPITAL_COMMUNITY): Payer: Self-pay | Admitting: *Deleted

## 2013-10-08 ENCOUNTER — Telehealth: Payer: Self-pay | Admitting: Vascular Surgery

## 2013-10-08 NOTE — Telephone Encounter (Signed)
Returning [name]'s earlier phone call about left finger numbness.  Joanne Carlson states that when she awoke this morning, the left 4th and 5th (finger) digits were numb and still are numb.. She states she is able to move the left fingers (including the 4th and 5th digits) and is able to grip with all the fingers of her left hand.  She states the color of her left fingers/ hand are normal/unchanged (not dusky) and she states that the temperature of her left fingers/ hand is normal (not cool or cold).  Discussed Joanne Carlson symptoms with Dr. Kellie Simmering who stated the left finger numbness is probably caused by ulnar nerve compression caused by a sleep position and should resolve.  He stated that these symptoms are not related to her 10-03-2013 right brachiocephalic AVF.  Relayed to Joanne Carlson Dr. Evelena Leyden explanation of left finger numbness. Joanne Carlson verbalized understanding.  Suggested she call [facility] is symptoms worsened or if she had further questions.

## 2013-10-08 NOTE — Telephone Encounter (Addendum)
Message copied by Gena Fray on Mon Oct 08, 2013 10:12 AM ------      Message from: Denman George      Created: Wed Oct 03, 2013  5:14 PM      Regarding: Zigmund Daniel log/ 6 week f/u appt/ duplex                   ----- Message -----         From: Mal Misty, MD         Sent: 10/03/2013  12:45 PM           To: [facility]            Message to office      This patient needs appointment to see me in 6 weeks with duplex scan of right upper arm brachial cephalic AV fistula ------  10/08/13: spoke with pt, dpm

## 2013-10-09 ENCOUNTER — Telehealth: Payer: Self-pay

## 2013-10-09 ENCOUNTER — Encounter (HOSPITAL_COMMUNITY): Payer: Medicare Other

## 2013-10-09 NOTE — Telephone Encounter (Signed)
Phone call from pt. Reported noticing swelling on the right side of her tongue this morning when she woke up about 6:00 AM.  Denied any swelling in throat, or sensation of throat closing-up.  Denied any signs of rash, or swelling in other areas of body.  Stated the swelling improved after approx. 2 hrs. Reported she has "just a little swelling left, but it is better."  Pt. stated the only new medication is Oxycodone 5 mg IR tab.; questioned if she had taken it prior to this surgery, and stated she took it after shoulder surgery, in 2013, and tolerated it fine.  Denied eating any different foods, recently, than what she normally eats.  Denies any food allergies.  Advised pt. to take  Benadryl 25 mg. Cap x1, over the counter, and to monitor for reccurance of symptoms.  Advised to call PCP or go to the ER if symptoms do recur.  Verb. Understanding.

## 2013-10-16 ENCOUNTER — Encounter (HOSPITAL_COMMUNITY): Payer: Medicare Other

## 2013-11-12 ENCOUNTER — Encounter: Payer: Self-pay | Admitting: Vascular Surgery

## 2013-11-13 ENCOUNTER — Ambulatory Visit (HOSPITAL_COMMUNITY)
Admission: RE | Admit: 2013-11-13 | Discharge: 2013-11-13 | Disposition: A | Discharge status: Home / Self Care | Payer: Medicare Other | Source: Ambulatory Visit | Attending: Vascular Surgery | Admitting: Vascular Surgery

## 2013-11-13 ENCOUNTER — Ambulatory Visit (INDEPENDENT_AMBULATORY_CARE_PROVIDER_SITE_OTHER): Payer: Medicare Other | Admitting: Vascular Surgery

## 2013-11-13 ENCOUNTER — Encounter: Payer: Self-pay | Admitting: Vascular Surgery

## 2013-11-13 ENCOUNTER — Encounter: Payer: Medicare Other | Admitting: Vascular Surgery

## 2013-11-13 ENCOUNTER — Other Ambulatory Visit (HOSPITAL_COMMUNITY): Payer: Medicare Other

## 2013-11-13 VITALS — BP 164/76 | HR 96 | Ht 65.0 in | Wt 216.0 lb

## 2013-11-13 DIAGNOSIS — N184 Chronic kidney disease, stage 4 (severe): Secondary | ICD-10-CM

## 2013-11-13 DIAGNOSIS — N186 End stage renal disease: Secondary | ICD-10-CM | POA: Insufficient documentation

## 2013-11-13 DIAGNOSIS — Z4931 Encounter for adequacy testing for hemodialysis: Secondary | ICD-10-CM | POA: Diagnosis not present

## 2013-11-13 NOTE — Progress Notes (Signed)
Subjective:     Patient ID: Joanne Carlson, female   DOB: Jul 25, 1945, 68 y.o.   MRN: [id number]  HPI this 68 year old female with chronic renal insufficiency and left brachial to cephalic AV fistula created by me on 10/03/2013. She denies any pain or numbness in the right hand. She is not yet on hemodialysis.  Review of Systems     Objective:   Physical Exam BP 164/76  Pulse 96  Ht 5\' 5"  (1.651 m)  Wt 216 lb (97.977 kg)  BMI 35.94 kg/m2  SpO2 100%  General well-developed well-nourished female no apparent stress alert and oriented x3 Right upper extremity was nicely healed antecubital wound. Excellent pulse and thrill in the upper arm brachial to cephalic AV fistula. 2+ radial pulse palpable distally with well-perfused right hand.  Today I ordered a duplex scan of the right upper term the fistula. The caliber of the vein is excellent at 0.6-0.7 cm in diameter. There are no competing branches noted there is excellent flow.     Assessment:     Nicely functioning right brachial to cephalic AV fistula created 10/03/2013-patient not yet on hemodialysis    Plan:     Able to use fistula in right upper extremity 01/02/2014 Return to see me on when necessary basis

## 2013-11-14 DIAGNOSIS — I12 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 stage renal disease: Secondary | ICD-10-CM | POA: Diagnosis not present

## 2013-11-14 DIAGNOSIS — N185 Chronic kidney disease, stage 5: Secondary | ICD-10-CM | POA: Diagnosis not present

## 2013-11-14 DIAGNOSIS — D631 Anemia in chronic kidney disease: Secondary | ICD-10-CM | POA: Diagnosis not present

## 2013-11-14 DIAGNOSIS — N2581 Secondary hyperparathyroidism of renal origin: Secondary | ICD-10-CM | POA: Diagnosis not present

## 2013-11-14 DIAGNOSIS — Z23 Encounter for immunization: Secondary | ICD-10-CM | POA: Diagnosis not present

## 2013-11-15 DIAGNOSIS — E161 Other hypoglycemia: Secondary | ICD-10-CM | POA: Diagnosis not present

## 2013-11-20 ENCOUNTER — Encounter: Payer: Self-pay | Admitting: Family Medicine

## 2013-12-05 IMAGING — CR DG HUMERUS 2V *R*
2 series · 2 of 2 positions shown · non-contrast
Comparison: Preliminary reading of Dr. Toofanee

CLINICAL DATA: Pain

RIGHT HUMERUS - 2+ VIEW

[AP]
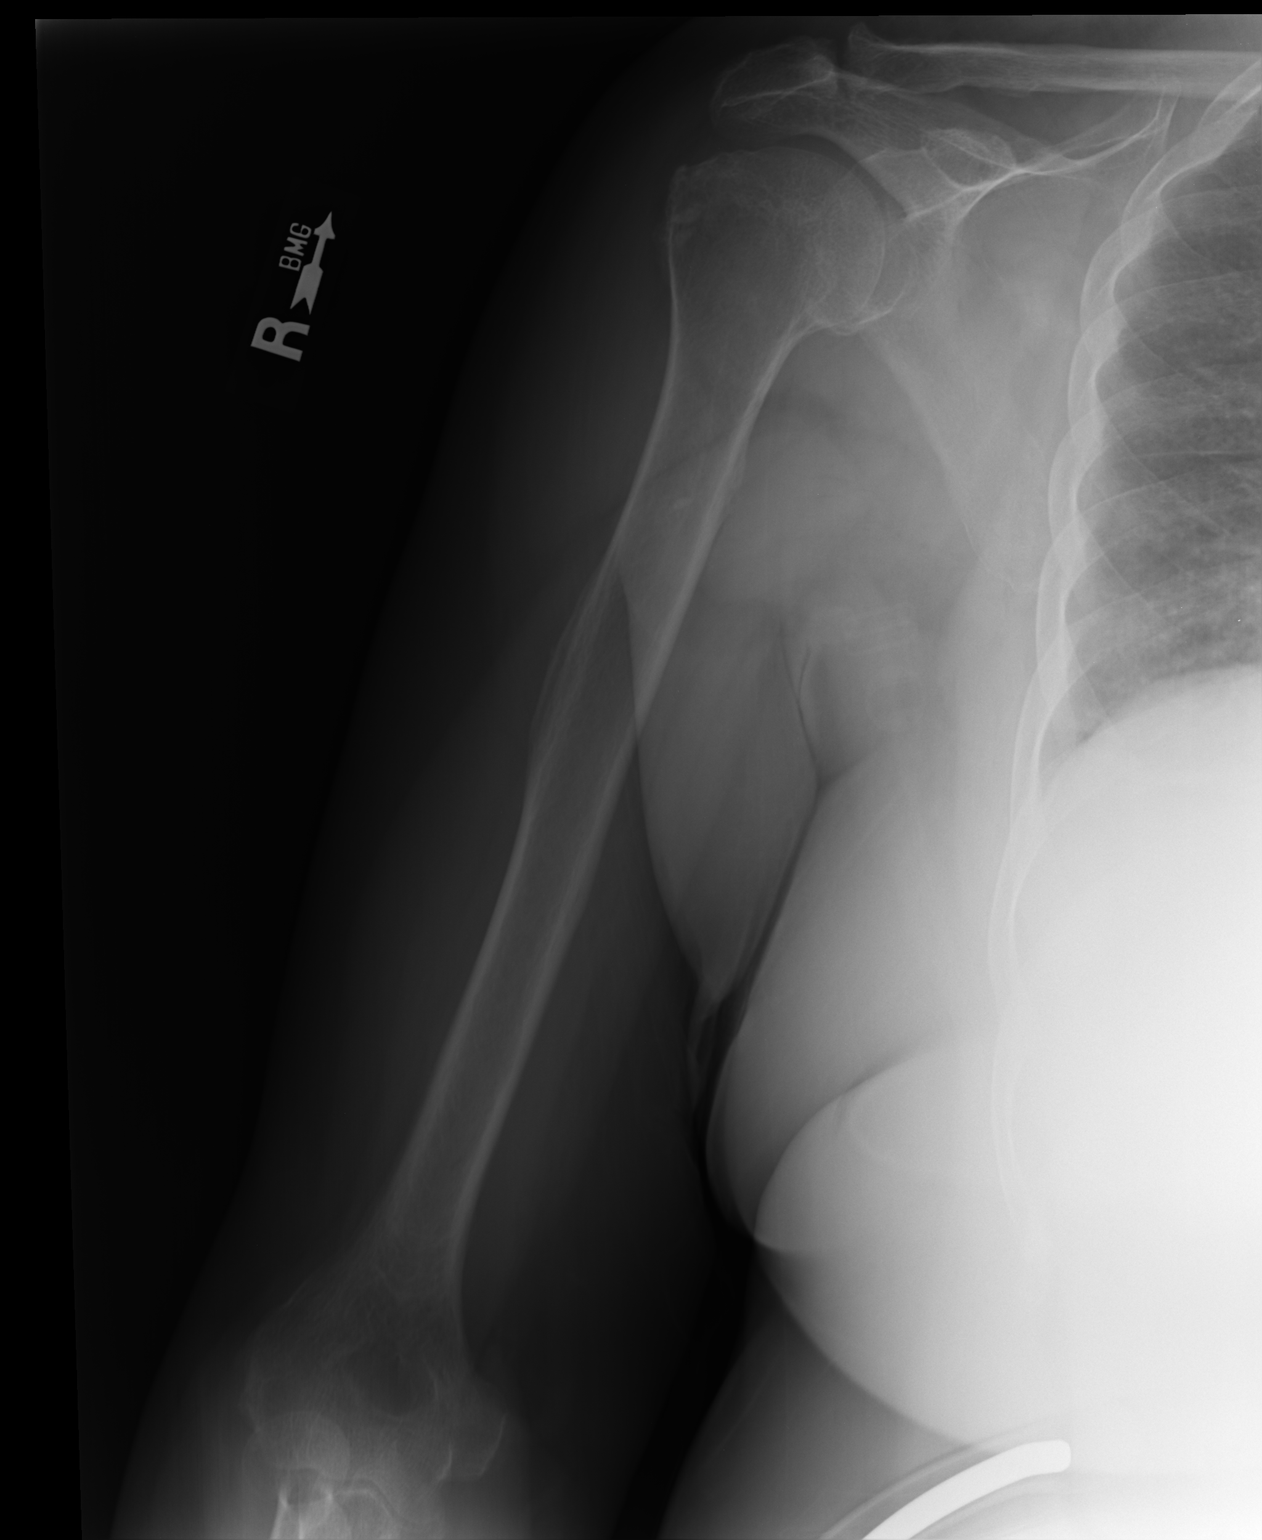

[lateral]
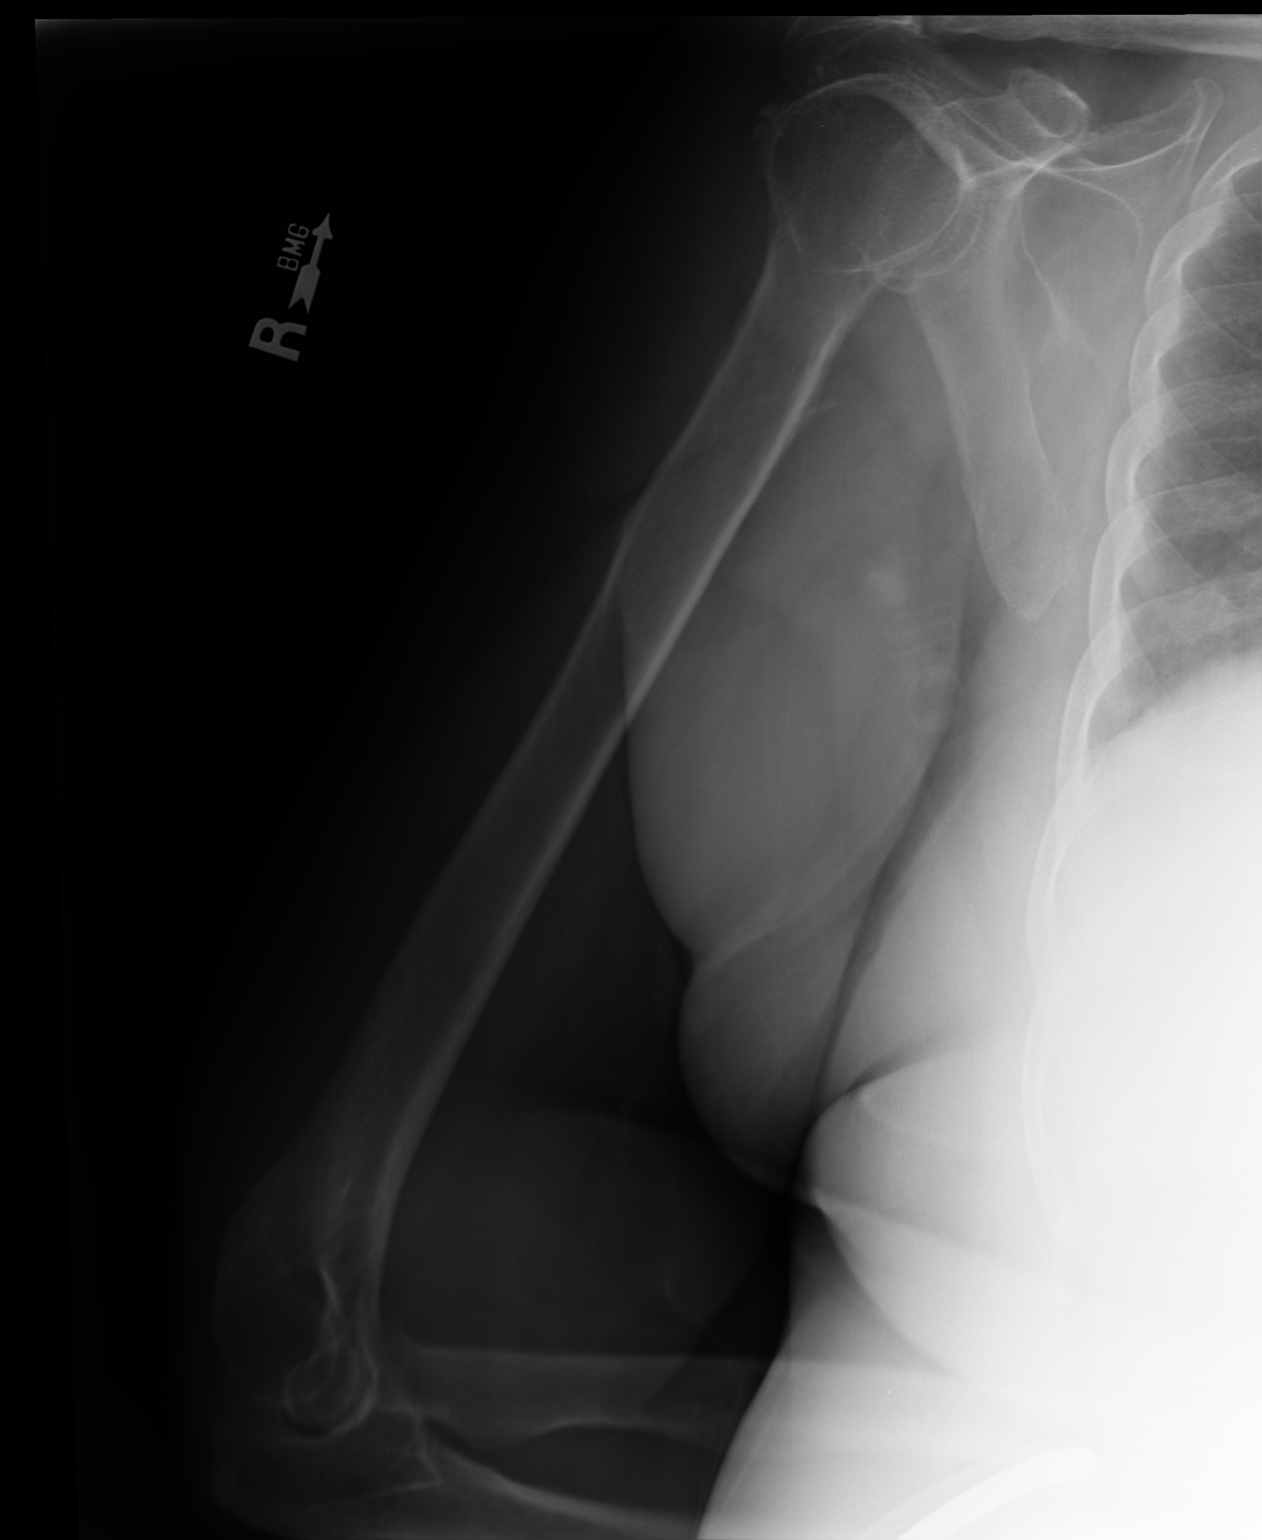

[2 of 2 positions shown; findings below may reference images not displayed]

FINDINGS: Two views right humerus submitted.  No acute fracture or
subluxation.  Mild degenerative changes right AC joint.  Mild
spurring of the right humeral head.
IMPRESSION: No acute fracture or subluxation.  Mild degenerative changes right
AC joint and humeral head.

Clinically significant discrepancy from primary report, if
provided: None

## 2013-12-05 IMAGING — CR DG SHOULDER 2+V*R*
3 series · 3 of 3 positions shown · non-contrast
Comparison: Preliminary reading of Dr. Vega

CLINICAL DATA: Pain

RIGHT SHOULDER - 2+ VIEW

[ap int rot]
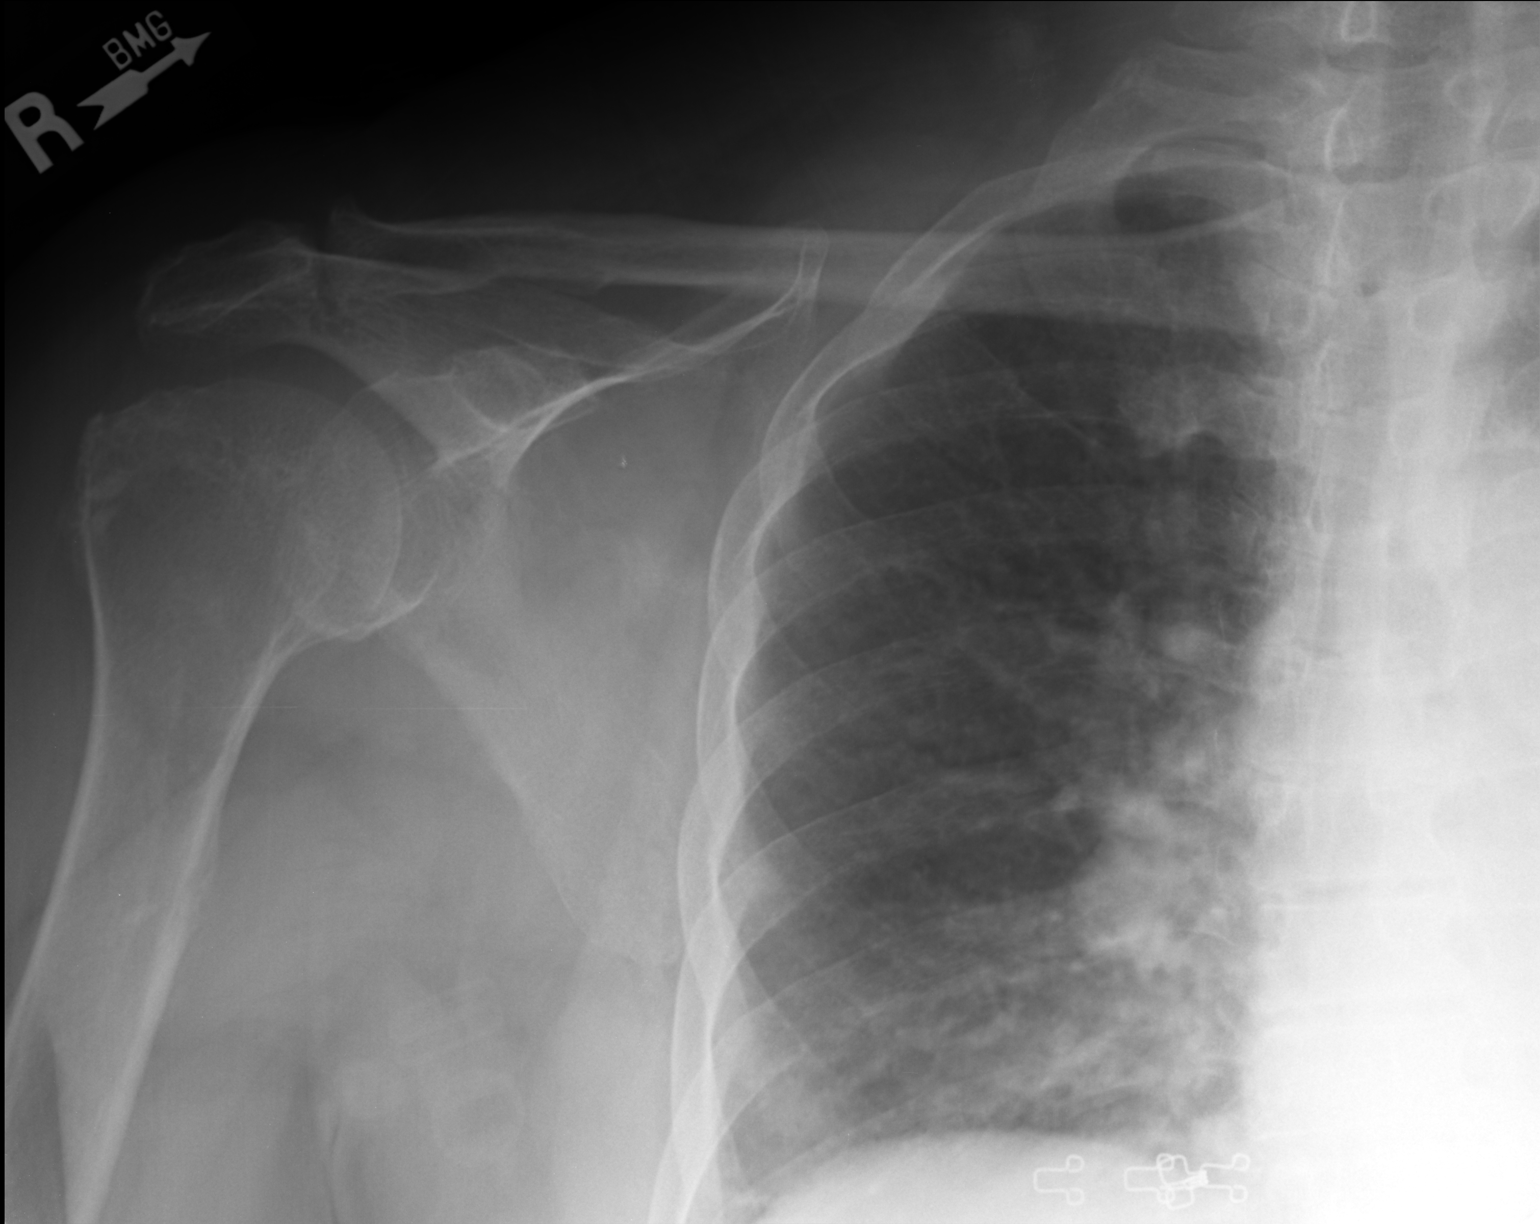

[ap ext rot]
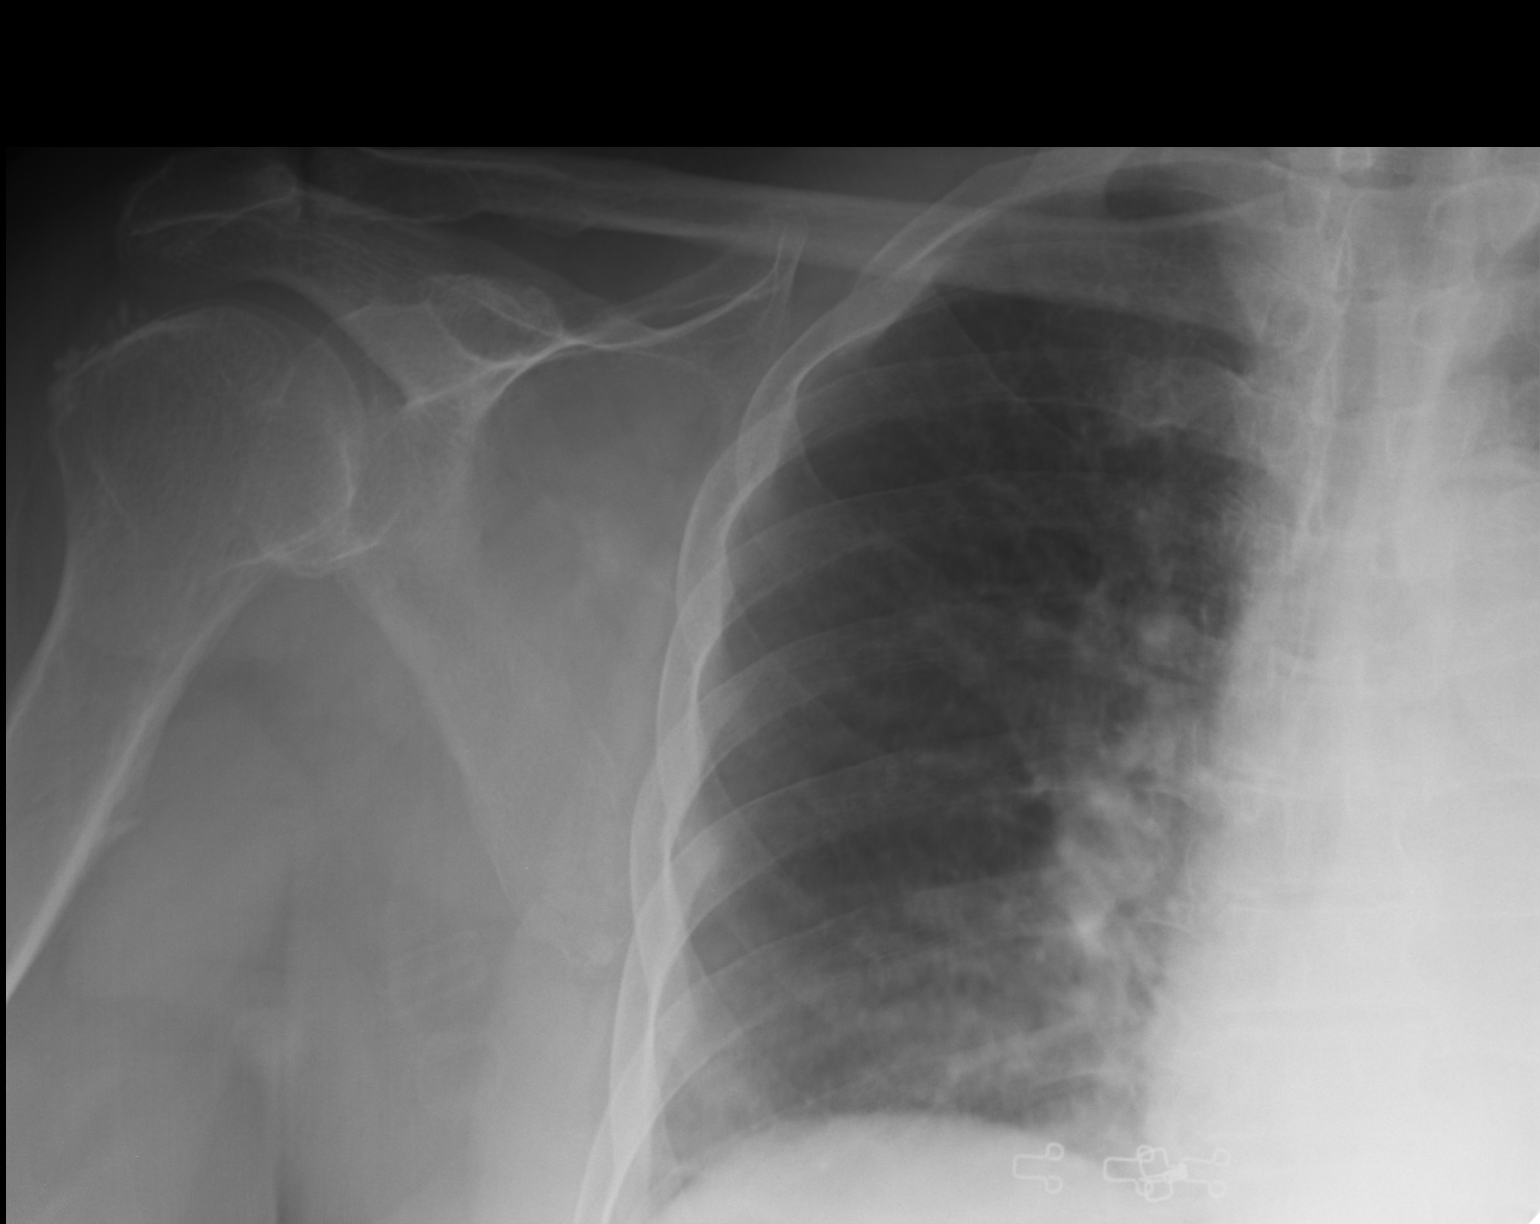

[AP]
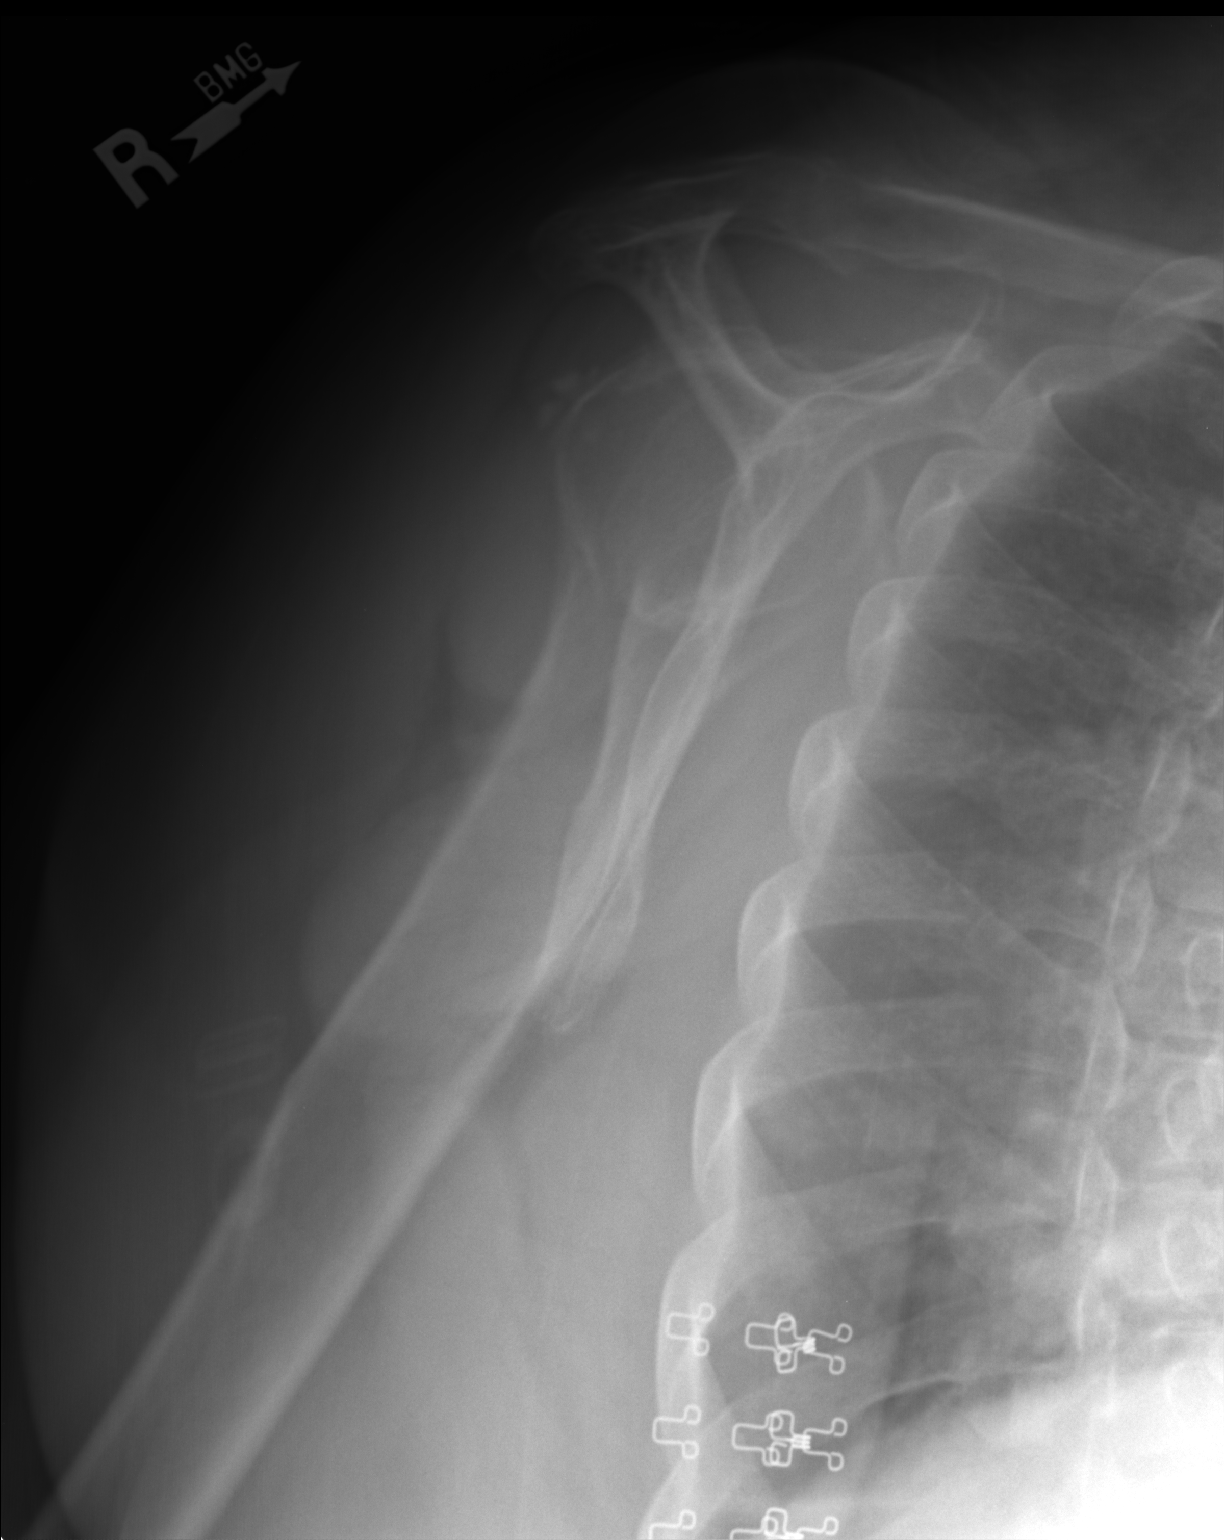

[3 of 3 positions shown; findings below may reference images not displayed]

FINDINGS: Three views of the right shoulder submitted.  No acute
fracture or subluxation. Mild degenerative changes right AC joint.
Mild spurring of the humeral head.  Small dystrophic calcifications
adjacent to greater humeral tuberosity suspicious for calcific
tendonitis.
IMPRESSION: No acute fracture or subluxation.] Mild degenerative changes right
AC joint.  Mild spurring of the humeral head.  Small dystrophic
calcifications adjacent to greater humeral tuberosity suspicious
for calcific tendonitis.

Clinically significant discrepancy from primary report, if
provided: None

## 2013-12-25 DIAGNOSIS — Z8673 Personal history of transient ischemic attack (TIA), and cerebral infarction without residual deficits: Secondary | ICD-10-CM | POA: Diagnosis not present

## 2013-12-25 DIAGNOSIS — Z87891 Personal history of nicotine dependence: Secondary | ICD-10-CM | POA: Diagnosis not present

## 2013-12-25 DIAGNOSIS — Q6101 Congenital single renal cyst: Secondary | ICD-10-CM | POA: Diagnosis not present

## 2013-12-25 DIAGNOSIS — N189 Chronic kidney disease, unspecified: Secondary | ICD-10-CM | POA: Diagnosis not present

## 2013-12-25 DIAGNOSIS — N289 Disorder of kidney and ureter, unspecified: Secondary | ICD-10-CM | POA: Diagnosis not present

## 2014-01-26 DIAGNOSIS — R059 Cough, unspecified: Secondary | ICD-10-CM | POA: Diagnosis not present

## 2014-01-26 DIAGNOSIS — J309 Allergic rhinitis, unspecified: Secondary | ICD-10-CM | POA: Diagnosis not present

## 2014-01-26 DIAGNOSIS — R05 Cough: Secondary | ICD-10-CM | POA: Diagnosis not present

## 2014-01-29 DIAGNOSIS — N039 Chronic nephritic syndrome with unspecified morphologic changes: Secondary | ICD-10-CM | POA: Diagnosis not present

## 2014-01-29 DIAGNOSIS — N2581 Secondary hyperparathyroidism of renal origin: Secondary | ICD-10-CM | POA: Diagnosis not present

## 2014-01-29 DIAGNOSIS — D631 Anemia in chronic kidney disease: Secondary | ICD-10-CM | POA: Diagnosis not present

## 2014-01-29 DIAGNOSIS — N185 Chronic kidney disease, stage 5: Secondary | ICD-10-CM | POA: Diagnosis not present

## 2014-01-29 DIAGNOSIS — I12 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 stage renal disease: Secondary | ICD-10-CM | POA: Diagnosis not present

## 2014-02-02 DIAGNOSIS — R05 Cough: Secondary | ICD-10-CM | POA: Diagnosis not present

## 2014-02-02 DIAGNOSIS — R059 Cough, unspecified: Secondary | ICD-10-CM | POA: Diagnosis not present

## 2014-03-08 DIAGNOSIS — N185 Chronic kidney disease, stage 5: Secondary | ICD-10-CM | POA: Diagnosis not present

## 2014-03-08 DIAGNOSIS — N2581 Secondary hyperparathyroidism of renal origin: Secondary | ICD-10-CM | POA: Diagnosis not present

## 2014-04-02 DIAGNOSIS — N2581 Secondary hyperparathyroidism of renal origin: Secondary | ICD-10-CM | POA: Diagnosis not present

## 2014-04-02 DIAGNOSIS — D631 Anemia in chronic kidney disease: Secondary | ICD-10-CM | POA: Diagnosis not present

## 2014-04-02 DIAGNOSIS — I12 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 stage renal disease: Secondary | ICD-10-CM | POA: Diagnosis not present

## 2014-04-02 DIAGNOSIS — M109 Gout, unspecified: Secondary | ICD-10-CM | POA: Diagnosis not present

## 2014-04-02 DIAGNOSIS — N185 Chronic kidney disease, stage 5: Secondary | ICD-10-CM | POA: Diagnosis not present

## 2014-04-02 DIAGNOSIS — Z23 Encounter for immunization: Secondary | ICD-10-CM | POA: Diagnosis not present

## 2014-04-02 DIAGNOSIS — E785 Hyperlipidemia, unspecified: Secondary | ICD-10-CM | POA: Diagnosis not present

## 2014-04-11 ENCOUNTER — Other Ambulatory Visit (HOSPITAL_COMMUNITY): Payer: Self-pay | Admitting: *Deleted

## 2014-04-12 ENCOUNTER — Inpatient Hospital Stay (HOSPITAL_COMMUNITY): Admission: RE | Admit: 2014-04-12 | Payer: Medicare Other | Source: Ambulatory Visit

## 2014-04-16 ENCOUNTER — Encounter (HOSPITAL_COMMUNITY)
Admission: RE | Admit: 2014-04-16 | Discharge: 2014-04-16 | Disposition: A | Discharge status: Home / Self Care | Payer: Medicare Other | Source: Ambulatory Visit | Attending: Nephrology | Admitting: Nephrology

## 2014-04-16 DIAGNOSIS — N184 Chronic kidney disease, stage 4 (severe): Secondary | ICD-10-CM | POA: Insufficient documentation

## 2014-04-16 DIAGNOSIS — D631 Anemia in chronic kidney disease: Secondary | ICD-10-CM | POA: Diagnosis not present

## 2014-04-16 LAB — IRON AND TIBC
Iron: 41 ug/dL — ABNORMAL LOW (ref 42–135)
Saturation Ratios: 17 % — ABNORMAL LOW (ref 20–55)
TIBC: 236 ug/dL — ABNORMAL LOW (ref 250–470)
UIBC: 195 ug/dL (ref 125–400)

## 2014-04-16 LAB — POCT HEMOGLOBIN-HEMACUE: HEMOGLOBIN: 8.7 g/dL — AB (ref 12.0–15.0)

## 2014-04-16 LAB — FERRITIN: FERRITIN: 333 ng/mL — AB (ref 10–291)

## 2014-04-16 MED ORDER — EPOETIN ALFA 20000 UNIT/ML IJ SOLN
INTRAMUSCULAR | Status: AC
  Administered 2014-04-16: 20000 [IU] via SUBCUTANEOUS
  Filled 2014-04-16: qty 1

## 2014-04-16 MED ORDER — EPOETIN ALFA 10000 UNIT/ML IJ SOLN: 20000.0000 [IU] | INTRAMUSCULAR | Status: DC

## 2014-04-19 DIAGNOSIS — N185 Chronic kidney disease, stage 5: Secondary | ICD-10-CM | POA: Diagnosis not present

## 2014-04-19 DIAGNOSIS — I129 Hypertensive chronic kidney disease with stage 1 through stage 4 chronic kidney disease, or unspecified chronic kidney disease: Secondary | ICD-10-CM | POA: Diagnosis not present

## 2014-04-26 DIAGNOSIS — N185 Chronic kidney disease, stage 5: Secondary | ICD-10-CM | POA: Diagnosis not present

## 2014-04-27 ENCOUNTER — Other Ambulatory Visit: Payer: Self-pay | Admitting: Nurse Practitioner

## 2014-04-29 ENCOUNTER — Other Ambulatory Visit (HOSPITAL_COMMUNITY): Payer: Self-pay | Admitting: *Deleted

## 2014-04-30 ENCOUNTER — Encounter (HOSPITAL_COMMUNITY)
Admission: RE | Admit: 2014-04-30 | Discharge: 2014-04-30 | Disposition: A | Discharge status: Home / Self Care | Payer: Medicare Other | Source: Ambulatory Visit | Attending: Nephrology | Admitting: Nephrology

## 2014-04-30 DIAGNOSIS — D631 Anemia in chronic kidney disease: Secondary | ICD-10-CM | POA: Diagnosis not present

## 2014-04-30 LAB — POCT HEMOGLOBIN-HEMACUE: HEMOGLOBIN: 8.8 g/dL — AB (ref 12.0–15.0)

## 2014-04-30 MED ORDER — EPOETIN ALFA 10000 UNIT/ML IJ SOLN
20000.0000 [IU] | INTRAMUSCULAR | Status: DC
  Administered 2014-04-30: 20000 [IU] via SUBCUTANEOUS

## 2014-04-30 MED ORDER — SODIUM CHLORIDE 0.9 % IV SOLN
510.0000 mg | Freq: Once | INTRAVENOUS | Status: AC
  Administered 2014-04-30: 510 mg via INTRAVENOUS
  Filled 2014-04-30: qty 17

## 2014-04-30 MED ORDER — EPOETIN ALFA 20000 UNIT/ML IJ SOLN
INTRAMUSCULAR | Status: AC
  Filled 2014-04-30: qty 1

## 2014-05-07 ENCOUNTER — Other Ambulatory Visit (HOSPITAL_COMMUNITY): Payer: Self-pay | Admitting: *Deleted

## 2014-05-08 ENCOUNTER — Encounter (HOSPITAL_COMMUNITY)
Admission: RE | Admit: 2014-05-08 | Discharge: 2014-05-08 | Disposition: A | Discharge status: Home / Self Care | Payer: Medicare Other | Source: Ambulatory Visit | Attending: Nephrology | Admitting: Nephrology

## 2014-05-08 DIAGNOSIS — D631 Anemia in chronic kidney disease: Secondary | ICD-10-CM | POA: Diagnosis not present

## 2014-05-08 DIAGNOSIS — N184 Chronic kidney disease, stage 4 (severe): Secondary | ICD-10-CM | POA: Insufficient documentation

## 2014-05-08 MED ORDER — SODIUM CHLORIDE 0.9 % IV SOLN
510.0000 mg | Freq: Once | INTRAVENOUS | Status: AC
  Administered 2014-05-08: 510 mg via INTRAVENOUS
  Filled 2014-05-08: qty 17

## 2014-05-15 ENCOUNTER — Inpatient Hospital Stay (HOSPITAL_COMMUNITY): Admission: RE | Admit: 2014-05-15 | Payer: Medicare Other | Source: Ambulatory Visit

## 2014-06-03 DIAGNOSIS — I12 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 stage renal disease: Secondary | ICD-10-CM | POA: Diagnosis not present

## 2014-06-03 DIAGNOSIS — N19 Unspecified kidney failure: Secondary | ICD-10-CM | POA: Diagnosis not present

## 2014-06-03 DIAGNOSIS — N185 Chronic kidney disease, stage 5: Secondary | ICD-10-CM | POA: Diagnosis not present

## 2014-06-03 DIAGNOSIS — E118 Type 2 diabetes mellitus with unspecified complications: Secondary | ICD-10-CM | POA: Diagnosis not present

## 2014-06-21 DIAGNOSIS — M65311 Trigger thumb, right thumb: Secondary | ICD-10-CM | POA: Diagnosis not present

## 2014-07-19 DIAGNOSIS — N185 Chronic kidney disease, stage 5: Secondary | ICD-10-CM | POA: Diagnosis not present

## 2014-07-19 DIAGNOSIS — E785 Hyperlipidemia, unspecified: Secondary | ICD-10-CM | POA: Diagnosis not present

## 2014-07-19 DIAGNOSIS — M109 Gout, unspecified: Secondary | ICD-10-CM | POA: Diagnosis not present

## 2014-07-19 DIAGNOSIS — M65311 Trigger thumb, right thumb: Secondary | ICD-10-CM | POA: Diagnosis not present

## 2014-07-19 DIAGNOSIS — I10 Essential (primary) hypertension: Secondary | ICD-10-CM | POA: Diagnosis not present

## 2014-07-19 DIAGNOSIS — E119 Type 2 diabetes mellitus without complications: Secondary | ICD-10-CM | POA: Diagnosis not present

## 2014-08-01 ENCOUNTER — Encounter (HOSPITAL_COMMUNITY)
Admission: RE | Admit: 2014-08-01 | Discharge: 2014-08-01 | Disposition: A | Discharge status: Home / Self Care | Payer: Medicare Other | Source: Ambulatory Visit | Attending: Nephrology | Admitting: Nephrology

## 2014-08-01 DIAGNOSIS — N184 Chronic kidney disease, stage 4 (severe): Secondary | ICD-10-CM | POA: Diagnosis not present

## 2014-08-01 DIAGNOSIS — D631 Anemia in chronic kidney disease: Secondary | ICD-10-CM | POA: Diagnosis not present

## 2014-08-01 LAB — IRON AND TIBC
Iron: 61 ug/dL (ref 42–145)
SATURATION RATIOS: 30 % (ref 20–55)
TIBC: 206 ug/dL — AB (ref 250–470)
UIBC: 145 ug/dL (ref 125–400)

## 2014-08-01 LAB — FERRITIN: FERRITIN: 660 ng/mL — AB (ref 10–291)

## 2014-08-01 MED ORDER — EPOETIN ALFA 10000 UNIT/ML IJ SOLN
30000.0000 [IU] | INTRAMUSCULAR | Status: DC
  Administered 2014-08-01: 30000 [IU] via SUBCUTANEOUS

## 2014-08-01 MED ORDER — EPOETIN ALFA 20000 UNIT/ML IJ SOLN
INTRAMUSCULAR | Status: AC
  Filled 2014-08-01: qty 1

## 2014-08-01 MED ORDER — EPOETIN ALFA 10000 UNIT/ML IJ SOLN
INTRAMUSCULAR | Status: AC
  Filled 2014-08-01: qty 1

## 2014-08-02 LAB — POCT HEMOGLOBIN-HEMACUE: Hemoglobin: 8.8 g/dL — ABNORMAL LOW (ref 12.0–15.0)

## 2014-08-02 MED FILL — Epoetin Alfa Inj 10000 Unit/ML: INTRAMUSCULAR | Qty: 1 | Status: AC

## 2014-08-02 MED FILL — Epoetin Alfa Inj 20000 Unit/ML: INTRAMUSCULAR | Qty: 1 | Status: AC

## 2014-08-14 ENCOUNTER — Encounter (HOSPITAL_COMMUNITY)
Admission: RE | Admit: 2014-08-14 | Discharge: 2014-08-14 | Disposition: A | Discharge status: Home / Self Care | Payer: Medicare Other | Source: Ambulatory Visit | Attending: Nephrology | Admitting: Nephrology

## 2014-08-14 DIAGNOSIS — N184 Chronic kidney disease, stage 4 (severe): Secondary | ICD-10-CM | POA: Insufficient documentation

## 2014-08-14 DIAGNOSIS — Z5181 Encounter for therapeutic drug level monitoring: Secondary | ICD-10-CM | POA: Diagnosis not present

## 2014-08-14 DIAGNOSIS — Z79899 Other long term (current) drug therapy: Secondary | ICD-10-CM | POA: Insufficient documentation

## 2014-08-14 DIAGNOSIS — D631 Anemia in chronic kidney disease: Secondary | ICD-10-CM | POA: Diagnosis not present

## 2014-08-14 LAB — POCT HEMOGLOBIN-HEMACUE: Hemoglobin: 8.8 g/dL — ABNORMAL LOW (ref 12.0–15.0)

## 2014-08-14 MED ORDER — EPOETIN ALFA 10000 UNIT/ML IJ SOLN
30000.0000 [IU] | INTRAMUSCULAR | Status: DC
Start: 2014-08-14 — End: 2014-08-15
  Administered 2014-08-14: 10000 [IU] via SUBCUTANEOUS

## 2014-08-14 MED ORDER — EPOETIN ALFA 20000 UNIT/ML IJ SOLN
INTRAMUSCULAR | Status: AC
  Administered 2014-08-14: 20000 [IU] via SUBCUTANEOUS
  Filled 2014-08-14: qty 1

## 2014-08-14 MED ORDER — EPOETIN ALFA 10000 UNIT/ML IJ SOLN
INTRAMUSCULAR | Status: AC
  Administered 2014-08-14: 10000 [IU] via SUBCUTANEOUS
  Filled 2014-08-14: qty 1

## 2014-08-15 ENCOUNTER — Encounter (HOSPITAL_COMMUNITY): Payer: Medicare Other

## 2014-08-19 DIAGNOSIS — I10 Essential (primary) hypertension: Secondary | ICD-10-CM | POA: Diagnosis not present

## 2014-08-29 ENCOUNTER — Inpatient Hospital Stay (HOSPITAL_COMMUNITY): Admission: RE | Admit: 2014-08-29 | Payer: Medicare Other | Source: Ambulatory Visit

## 2014-08-30 ENCOUNTER — Encounter (HOSPITAL_COMMUNITY)
Admission: RE | Admit: 2014-08-30 | Discharge: 2014-08-30 | Disposition: A | Discharge status: Home / Self Care | Payer: Medicare Other | Source: Ambulatory Visit | Attending: Nephrology | Admitting: Nephrology

## 2014-08-30 DIAGNOSIS — Z79899 Other long term (current) drug therapy: Secondary | ICD-10-CM | POA: Diagnosis not present

## 2014-08-30 DIAGNOSIS — Z5181 Encounter for therapeutic drug level monitoring: Secondary | ICD-10-CM | POA: Diagnosis not present

## 2014-08-30 DIAGNOSIS — D631 Anemia in chronic kidney disease: Secondary | ICD-10-CM | POA: Diagnosis not present

## 2014-08-30 DIAGNOSIS — N184 Chronic kidney disease, stage 4 (severe): Secondary | ICD-10-CM | POA: Diagnosis not present

## 2014-08-30 LAB — IRON AND TIBC
Iron: 63 ug/dL (ref 42–145)
Saturation Ratios: 29 % (ref 20–55)
TIBC: 216 ug/dL — ABNORMAL LOW (ref 250–470)
UIBC: 153 ug/dL (ref 125–400)

## 2014-08-30 LAB — POCT HEMOGLOBIN-HEMACUE: HEMOGLOBIN: 9.8 g/dL — AB (ref 12.0–15.0)

## 2014-08-30 LAB — FERRITIN: Ferritin: 562 ng/mL — ABNORMAL HIGH (ref 10–291)

## 2014-08-30 MED ORDER — EPOETIN ALFA 10000 UNIT/ML IJ SOLN
INTRAMUSCULAR | Status: AC
  Filled 2014-08-30: qty 1

## 2014-08-30 MED ORDER — EPOETIN ALFA 20000 UNIT/ML IJ SOLN
INTRAMUSCULAR | Status: AC
  Administered 2014-08-30: 20000 [IU]
  Filled 2014-08-30: qty 1

## 2014-08-30 MED ORDER — EPOETIN ALFA 10000 UNIT/ML IJ SOLN
30000.0000 [IU] | INTRAMUSCULAR | Status: DC
  Administered 2014-08-30: 10000 [IU] via SUBCUTANEOUS

## 2014-09-12 ENCOUNTER — Encounter (HOSPITAL_COMMUNITY)
Admission: RE | Admit: 2014-09-12 | Discharge: 2014-09-12 | Disposition: A | Discharge status: Home / Self Care | Payer: Medicare Other | Source: Ambulatory Visit | Attending: Nephrology | Admitting: Nephrology

## 2014-09-12 DIAGNOSIS — D631 Anemia in chronic kidney disease: Secondary | ICD-10-CM | POA: Insufficient documentation

## 2014-09-12 DIAGNOSIS — N184 Chronic kidney disease, stage 4 (severe): Secondary | ICD-10-CM | POA: Insufficient documentation

## 2014-09-12 MED ORDER — EPOETIN ALFA 10000 UNIT/ML IJ SOLN
INTRAMUSCULAR | Status: AC
  Administered 2014-09-12: 10000 [IU]
  Filled 2014-09-12: qty 1

## 2014-09-12 MED ORDER — EPOETIN ALFA 20000 UNIT/ML IJ SOLN
INTRAMUSCULAR | Status: AC
  Administered 2014-09-12: 20000 [IU]
  Filled 2014-09-12: qty 1

## 2014-09-12 MED ORDER — EPOETIN ALFA 10000 UNIT/ML IJ SOLN: 30000.0000 [IU] | INTRAMUSCULAR | Status: DC

## 2014-09-13 LAB — POCT HEMOGLOBIN-HEMACUE: HEMOGLOBIN: 10.5 g/dL — AB (ref 12.0–15.0)

## 2014-09-17 DIAGNOSIS — M1000 Idiopathic gout, unspecified site: Secondary | ICD-10-CM | POA: Diagnosis not present

## 2014-09-19 DIAGNOSIS — N185 Chronic kidney disease, stage 5: Secondary | ICD-10-CM | POA: Diagnosis not present

## 2014-09-19 DIAGNOSIS — N390 Urinary tract infection, site not specified: Secondary | ICD-10-CM | POA: Diagnosis not present

## 2014-09-19 DIAGNOSIS — N2581 Secondary hyperparathyroidism of renal origin: Secondary | ICD-10-CM | POA: Diagnosis not present

## 2014-09-19 DIAGNOSIS — I12 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 stage renal disease: Secondary | ICD-10-CM | POA: Diagnosis not present

## 2014-09-19 DIAGNOSIS — D631 Anemia in chronic kidney disease: Secondary | ICD-10-CM | POA: Diagnosis not present

## 2014-09-26 ENCOUNTER — Encounter (HOSPITAL_COMMUNITY)
Admission: RE | Admit: 2014-09-26 | Discharge: 2014-09-26 | Disposition: A | Discharge status: Home / Self Care | Payer: Medicare Other | Source: Ambulatory Visit | Attending: Nephrology | Admitting: Nephrology

## 2014-09-26 DIAGNOSIS — N184 Chronic kidney disease, stage 4 (severe): Secondary | ICD-10-CM | POA: Diagnosis not present

## 2014-09-26 DIAGNOSIS — D631 Anemia in chronic kidney disease: Secondary | ICD-10-CM | POA: Diagnosis not present

## 2014-09-26 LAB — POCT HEMOGLOBIN-HEMACUE: Hemoglobin: 10.5 g/dL — ABNORMAL LOW (ref 12.0–15.0)

## 2014-09-26 LAB — IRON AND TIBC
Iron: 47 ug/dL (ref 42–145)
Saturation Ratios: 24 % (ref 20–55)
TIBC: 200 ug/dL — AB (ref 250–470)
UIBC: 153 ug/dL (ref 125–400)

## 2014-09-26 MED ORDER — EPOETIN ALFA 20000 UNIT/ML IJ SOLN
INTRAMUSCULAR | Status: AC
  Administered 2014-09-26: 20000 [IU] via SUBCUTANEOUS
  Filled 2014-09-26: qty 1

## 2014-09-26 MED ORDER — EPOETIN ALFA 10000 UNIT/ML IJ SOLN: 30000.0000 [IU] | INTRAMUSCULAR | Status: DC

## 2014-09-26 MED ORDER — EPOETIN ALFA 10000 UNIT/ML IJ SOLN
INTRAMUSCULAR | Status: AC
  Administered 2014-09-26: 10000 [IU] via SUBCUTANEOUS
  Filled 2014-09-26: qty 1

## 2014-09-27 LAB — FERRITIN: Ferritin: 457 ng/mL — ABNORMAL HIGH (ref 10–291)

## 2014-10-09 ENCOUNTER — Other Ambulatory Visit (HOSPITAL_COMMUNITY): Payer: Self-pay | Admitting: *Deleted

## 2014-10-10 ENCOUNTER — Encounter (HOSPITAL_COMMUNITY)
Admission: RE | Admit: 2014-10-10 | Discharge: 2014-10-10 | Disposition: A | Discharge status: Home / Self Care | Payer: Medicare Other | Source: Ambulatory Visit | Attending: Nephrology | Admitting: Nephrology

## 2014-10-10 DIAGNOSIS — D631 Anemia in chronic kidney disease: Secondary | ICD-10-CM | POA: Diagnosis not present

## 2014-10-10 DIAGNOSIS — D509 Iron deficiency anemia, unspecified: Secondary | ICD-10-CM | POA: Diagnosis not present

## 2014-10-10 DIAGNOSIS — Z5181 Encounter for therapeutic drug level monitoring: Secondary | ICD-10-CM | POA: Insufficient documentation

## 2014-10-10 DIAGNOSIS — Z79899 Other long term (current) drug therapy: Secondary | ICD-10-CM | POA: Diagnosis not present

## 2014-10-10 DIAGNOSIS — N184 Chronic kidney disease, stage 4 (severe): Secondary | ICD-10-CM | POA: Insufficient documentation

## 2014-10-10 LAB — POCT HEMOGLOBIN-HEMACUE: HEMOGLOBIN: 11 g/dL — AB (ref 12.0–15.0)

## 2014-10-10 MED ORDER — EPOETIN ALFA 20000 UNIT/ML IJ SOLN
INTRAMUSCULAR | Status: AC
  Filled 2014-10-10: qty 1

## 2014-10-10 MED ORDER — EPOETIN ALFA 10000 UNIT/ML IJ SOLN
30000.0000 [IU] | INTRAMUSCULAR | Status: DC
  Administered 2014-10-10: 10000 [IU] via SUBCUTANEOUS

## 2014-10-10 MED ORDER — SODIUM CHLORIDE 0.9 % IV SOLN
510.0000 mg | Freq: Once | INTRAVENOUS | Status: AC
  Administered 2014-10-10: 510 mg via INTRAVENOUS
  Filled 2014-10-10: qty 17

## 2014-10-10 MED ORDER — EPOETIN ALFA 10000 UNIT/ML IJ SOLN
INTRAMUSCULAR | Status: AC
  Filled 2014-10-10: qty 1

## 2014-10-24 ENCOUNTER — Encounter (HOSPITAL_COMMUNITY)
Admission: RE | Admit: 2014-10-24 | Discharge: 2014-10-24 | Disposition: A | Discharge status: Home / Self Care | Payer: Medicare Other | Source: Ambulatory Visit | Attending: Nephrology | Admitting: Nephrology

## 2014-10-24 DIAGNOSIS — D509 Iron deficiency anemia, unspecified: Secondary | ICD-10-CM | POA: Diagnosis not present

## 2014-10-24 DIAGNOSIS — Z5181 Encounter for therapeutic drug level monitoring: Secondary | ICD-10-CM | POA: Diagnosis not present

## 2014-10-24 DIAGNOSIS — N184 Chronic kidney disease, stage 4 (severe): Secondary | ICD-10-CM | POA: Diagnosis not present

## 2014-10-24 DIAGNOSIS — D631 Anemia in chronic kidney disease: Secondary | ICD-10-CM | POA: Diagnosis not present

## 2014-10-24 DIAGNOSIS — Z79899 Other long term (current) drug therapy: Secondary | ICD-10-CM | POA: Diagnosis not present

## 2014-10-24 LAB — IRON AND TIBC
Iron: 56 ug/dL (ref 42–145)
SATURATION RATIOS: 26 % (ref 20–55)
TIBC: 212 ug/dL — ABNORMAL LOW (ref 250–470)
UIBC: 156 ug/dL (ref 125–400)

## 2014-10-24 LAB — FERRITIN: Ferritin: 722 ng/mL — ABNORMAL HIGH (ref 10–291)

## 2014-10-24 LAB — POCT HEMOGLOBIN-HEMACUE: Hemoglobin: 11.5 g/dL — ABNORMAL LOW (ref 12.0–15.0)

## 2014-10-24 MED ORDER — EPOETIN ALFA 20000 UNIT/ML IJ SOLN
INTRAMUSCULAR | Status: AC
  Administered 2014-10-24: 20000 [IU]
  Filled 2014-10-24: qty 1

## 2014-10-24 MED ORDER — EPOETIN ALFA 10000 UNIT/ML IJ SOLN: 30000.0000 [IU] | INTRAMUSCULAR | Status: DC

## 2014-10-24 MED ORDER — EPOETIN ALFA 10000 UNIT/ML IJ SOLN
INTRAMUSCULAR | Status: AC
  Administered 2014-10-24: 10000 [IU]
  Filled 2014-10-24: qty 1

## 2014-11-06 ENCOUNTER — Other Ambulatory Visit (HOSPITAL_COMMUNITY): Payer: Self-pay | Admitting: *Deleted

## 2014-11-07 ENCOUNTER — Encounter (HOSPITAL_COMMUNITY)
Admission: RE | Admit: 2014-11-07 | Discharge: 2014-11-07 | Disposition: A | Discharge status: Home / Self Care | Payer: Medicare Other | Source: Ambulatory Visit | Attending: Nephrology | Admitting: Nephrology

## 2014-11-07 DIAGNOSIS — N184 Chronic kidney disease, stage 4 (severe): Secondary | ICD-10-CM | POA: Insufficient documentation

## 2014-11-07 DIAGNOSIS — D631 Anemia in chronic kidney disease: Secondary | ICD-10-CM | POA: Diagnosis not present

## 2014-11-07 LAB — POCT HEMOGLOBIN-HEMACUE: Hemoglobin: 11.3 g/dL — ABNORMAL LOW (ref 12.0–15.0)

## 2014-11-07 MED ORDER — EPOETIN ALFA 10000 UNIT/ML IJ SOLN
INTRAMUSCULAR | Status: AC
  Administered 2014-11-07: 10000 [IU] via SUBCUTANEOUS
  Filled 2014-11-07: qty 1

## 2014-11-07 MED ORDER — EPOETIN ALFA 20000 UNIT/ML IJ SOLN
INTRAMUSCULAR | Status: AC
  Administered 2014-11-07: 20000 [IU] via SUBCUTANEOUS
  Filled 2014-11-07: qty 1

## 2014-11-07 MED ORDER — EPOETIN ALFA 10000 UNIT/ML IJ SOLN: 30000.0000 [IU] | INTRAMUSCULAR | Status: DC

## 2014-11-07 MED ORDER — FERUMOXYTOL INJECTION 510 MG/17 ML
510.0000 mg | Freq: Once | INTRAVENOUS | Status: AC
  Administered 2014-11-07: 510 mg via INTRAVENOUS
  Filled 2014-11-07: qty 17

## 2014-11-20 DIAGNOSIS — N2581 Secondary hyperparathyroidism of renal origin: Secondary | ICD-10-CM | POA: Diagnosis not present

## 2014-11-20 DIAGNOSIS — Z0000 Encounter for general adult medical examination without abnormal findings: Secondary | ICD-10-CM | POA: Diagnosis not present

## 2014-11-20 DIAGNOSIS — N185 Chronic kidney disease, stage 5: Secondary | ICD-10-CM | POA: Diagnosis not present

## 2014-11-20 DIAGNOSIS — I10 Essential (primary) hypertension: Secondary | ICD-10-CM | POA: Diagnosis not present

## 2014-11-20 DIAGNOSIS — M109 Gout, unspecified: Secondary | ICD-10-CM | POA: Diagnosis not present

## 2014-11-20 DIAGNOSIS — E1140 Type 2 diabetes mellitus with diabetic neuropathy, unspecified: Secondary | ICD-10-CM | POA: Diagnosis not present

## 2014-11-20 DIAGNOSIS — Z1239 Encounter for other screening for malignant neoplasm of breast: Secondary | ICD-10-CM | POA: Diagnosis not present

## 2014-11-20 DIAGNOSIS — N2889 Other specified disorders of kidney and ureter: Secondary | ICD-10-CM | POA: Diagnosis not present

## 2014-11-20 DIAGNOSIS — E785 Hyperlipidemia, unspecified: Secondary | ICD-10-CM | POA: Diagnosis not present

## 2014-11-20 DIAGNOSIS — E118 Type 2 diabetes mellitus with unspecified complications: Secondary | ICD-10-CM | POA: Diagnosis not present

## 2014-11-20 DIAGNOSIS — Z0001 Encounter for general adult medical examination with abnormal findings: Secondary | ICD-10-CM | POA: Diagnosis not present

## 2014-11-21 ENCOUNTER — Encounter (HOSPITAL_COMMUNITY)
Admission: RE | Admit: 2014-11-21 | Discharge: 2014-11-21 | Disposition: A | Discharge status: Home / Self Care | Payer: Medicare Other | Source: Ambulatory Visit | Attending: Nephrology | Admitting: Nephrology

## 2014-11-21 DIAGNOSIS — N184 Chronic kidney disease, stage 4 (severe): Secondary | ICD-10-CM | POA: Diagnosis not present

## 2014-11-21 DIAGNOSIS — D631 Anemia in chronic kidney disease: Secondary | ICD-10-CM | POA: Diagnosis not present

## 2014-11-21 LAB — IRON AND TIBC
IRON: 63 ug/dL (ref 28–170)
Saturation Ratios: 28 % (ref 10.4–31.8)
TIBC: 227 ug/dL — AB (ref 250–450)
UIBC: 164 ug/dL

## 2014-11-21 LAB — FERRITIN: FERRITIN: 781 ng/mL — AB (ref 11–307)

## 2014-11-21 MED ORDER — EPOETIN ALFA 10000 UNIT/ML IJ SOLN: 30000.0000 [IU] | INTRAMUSCULAR | Status: DC

## 2014-11-22 ENCOUNTER — Other Ambulatory Visit: Payer: Self-pay | Admitting: Family Medicine

## 2014-11-22 DIAGNOSIS — E2839 Other primary ovarian failure: Secondary | ICD-10-CM

## 2014-11-22 DIAGNOSIS — Z1231 Encounter for screening mammogram for malignant neoplasm of breast: Secondary | ICD-10-CM

## 2014-11-22 LAB — POCT HEMOGLOBIN-HEMACUE: Hemoglobin: 12.1 g/dL (ref 12.0–15.0)

## 2014-11-30 DIAGNOSIS — H2513 Age-related nuclear cataract, bilateral: Secondary | ICD-10-CM | POA: Diagnosis not present

## 2014-11-30 DIAGNOSIS — H40033 Anatomical narrow angle, bilateral: Secondary | ICD-10-CM | POA: Diagnosis not present

## 2014-12-04 ENCOUNTER — Other Ambulatory Visit (HOSPITAL_COMMUNITY): Payer: Self-pay | Admitting: *Deleted

## 2014-12-05 ENCOUNTER — Encounter (HOSPITAL_COMMUNITY)
Admission: RE | Admit: 2014-12-05 | Discharge: 2014-12-05 | Disposition: A | Discharge status: Home / Self Care | Payer: Medicare Other | Source: Ambulatory Visit | Attending: Nephrology | Admitting: Nephrology

## 2014-12-05 DIAGNOSIS — D631 Anemia in chronic kidney disease: Secondary | ICD-10-CM | POA: Insufficient documentation

## 2014-12-05 DIAGNOSIS — N184 Chronic kidney disease, stage 4 (severe): Secondary | ICD-10-CM | POA: Diagnosis not present

## 2014-12-05 LAB — POCT HEMOGLOBIN-HEMACUE: HEMOGLOBIN: 12.2 g/dL (ref 12.0–15.0)

## 2014-12-05 MED ORDER — SODIUM CHLORIDE 0.9 % IV SOLN
510.0000 mg | Freq: Once | INTRAVENOUS | Status: AC
  Administered 2014-12-05: 510 mg via INTRAVENOUS
  Filled 2014-12-05: qty 17

## 2014-12-05 MED ORDER — EPOETIN ALFA 10000 UNIT/ML IJ SOLN: 30000.0000 [IU] | INTRAMUSCULAR | Status: DC

## 2014-12-12 ENCOUNTER — Ambulatory Visit
Admission: RE | Admit: 2014-12-12 | Discharge: 2014-12-12 | Disposition: A | Discharge status: Home / Self Care | Payer: Medicare Other | Source: Ambulatory Visit | Attending: Family Medicine | Admitting: Family Medicine

## 2014-12-12 DIAGNOSIS — M810 Age-related osteoporosis without current pathological fracture: Secondary | ICD-10-CM | POA: Diagnosis not present

## 2014-12-12 DIAGNOSIS — E2839 Other primary ovarian failure: Secondary | ICD-10-CM

## 2014-12-12 DIAGNOSIS — Z1231 Encounter for screening mammogram for malignant neoplasm of breast: Secondary | ICD-10-CM | POA: Diagnosis not present

## 2014-12-16 ENCOUNTER — Other Ambulatory Visit (HOSPITAL_COMMUNITY): Payer: Self-pay | Admitting: *Deleted

## 2014-12-19 ENCOUNTER — Encounter (HOSPITAL_COMMUNITY)
Admission: RE | Admit: 2014-12-19 | Discharge: 2014-12-19 | Disposition: A | Discharge status: Home / Self Care | Payer: Medicare Other | Source: Ambulatory Visit | Attending: Nephrology | Admitting: Nephrology

## 2014-12-19 DIAGNOSIS — D631 Anemia in chronic kidney disease: Secondary | ICD-10-CM | POA: Diagnosis not present

## 2014-12-19 DIAGNOSIS — N184 Chronic kidney disease, stage 4 (severe): Secondary | ICD-10-CM | POA: Diagnosis not present

## 2014-12-19 LAB — FERRITIN: Ferritin: 1102 ng/mL — ABNORMAL HIGH (ref 11–307)

## 2014-12-19 LAB — IRON AND TIBC
Iron: 99 ug/dL (ref 28–170)
Saturation Ratios: 48 % — ABNORMAL HIGH (ref 10.4–31.8)
TIBC: 204 ug/dL — ABNORMAL LOW (ref 250–450)
UIBC: 105 ug/dL

## 2014-12-19 LAB — POCT HEMOGLOBIN-HEMACUE: Hemoglobin: 10.8 g/dL — ABNORMAL LOW (ref 12.0–15.0)

## 2014-12-19 MED ORDER — SODIUM CHLORIDE 0.9 % IV SOLN
510.0000 mg | Freq: Once | INTRAVENOUS | Status: AC
  Administered 2014-12-19: 510 mg via INTRAVENOUS
  Filled 2014-12-19: qty 17

## 2014-12-19 MED ORDER — EPOETIN ALFA 20000 UNIT/ML IJ SOLN
INTRAMUSCULAR | Status: AC
  Administered 2014-12-19: 20000 [IU]
  Filled 2014-12-19: qty 1

## 2014-12-19 MED ORDER — EPOETIN ALFA 10000 UNIT/ML IJ SOLN
INTRAMUSCULAR | Status: AC
  Administered 2014-12-19: 10000 [IU]
  Filled 2014-12-19: qty 1

## 2014-12-19 MED ORDER — EPOETIN ALFA 10000 UNIT/ML IJ SOLN: 30000.0000 [IU] | INTRAMUSCULAR | Status: DC

## 2015-01-02 ENCOUNTER — Encounter (HOSPITAL_COMMUNITY)
Admission: RE | Admit: 2015-01-02 | Discharge: 2015-01-02 | Disposition: A | Discharge status: Home / Self Care | Payer: Medicare Other | Source: Ambulatory Visit | Attending: Nephrology | Admitting: Nephrology

## 2015-01-02 DIAGNOSIS — Z5181 Encounter for therapeutic drug level monitoring: Secondary | ICD-10-CM | POA: Insufficient documentation

## 2015-01-02 DIAGNOSIS — N184 Chronic kidney disease, stage 4 (severe): Secondary | ICD-10-CM | POA: Diagnosis not present

## 2015-01-02 DIAGNOSIS — D631 Anemia in chronic kidney disease: Secondary | ICD-10-CM | POA: Insufficient documentation

## 2015-01-02 DIAGNOSIS — Z79899 Other long term (current) drug therapy: Secondary | ICD-10-CM | POA: Diagnosis not present

## 2015-01-02 LAB — POCT HEMOGLOBIN-HEMACUE: Hemoglobin: 11.4 g/dL — ABNORMAL LOW (ref 12.0–15.0)

## 2015-01-02 MED ORDER — EPOETIN ALFA 20000 UNIT/ML IJ SOLN
INTRAMUSCULAR | Status: AC
  Administered 2015-01-02: 20000 [IU] via SUBCUTANEOUS
  Filled 2015-01-02: qty 1

## 2015-01-02 MED ORDER — EPOETIN ALFA 10000 UNIT/ML IJ SOLN
INTRAMUSCULAR | Status: AC
  Administered 2015-01-02: 10000 [IU] via SUBCUTANEOUS
  Filled 2015-01-02: qty 1

## 2015-01-02 MED ORDER — EPOETIN ALFA 10000 UNIT/ML IJ SOLN: 30000.0000 [IU] | INTRAMUSCULAR | Status: DC

## 2015-01-09 DIAGNOSIS — N185 Chronic kidney disease, stage 5: Secondary | ICD-10-CM | POA: Diagnosis not present

## 2015-01-09 DIAGNOSIS — D631 Anemia in chronic kidney disease: Secondary | ICD-10-CM | POA: Diagnosis not present

## 2015-01-09 DIAGNOSIS — N2581 Secondary hyperparathyroidism of renal origin: Secondary | ICD-10-CM | POA: Diagnosis not present

## 2015-01-09 DIAGNOSIS — I12 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 stage renal disease: Secondary | ICD-10-CM | POA: Diagnosis not present

## 2015-01-09 DIAGNOSIS — N390 Urinary tract infection, site not specified: Secondary | ICD-10-CM | POA: Diagnosis not present

## 2015-01-09 DIAGNOSIS — E1121 Type 2 diabetes mellitus with diabetic nephropathy: Secondary | ICD-10-CM | POA: Diagnosis not present

## 2015-01-14 DIAGNOSIS — N281 Cyst of kidney, acquired: Secondary | ICD-10-CM | POA: Diagnosis not present

## 2015-01-14 DIAGNOSIS — N185 Chronic kidney disease, stage 5: Secondary | ICD-10-CM | POA: Diagnosis not present

## 2015-01-14 DIAGNOSIS — N2889 Other specified disorders of kidney and ureter: Secondary | ICD-10-CM | POA: Diagnosis not present

## 2015-01-14 DIAGNOSIS — E119 Type 2 diabetes mellitus without complications: Secondary | ICD-10-CM | POA: Diagnosis not present

## 2015-01-14 DIAGNOSIS — I12 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 stage renal disease: Secondary | ICD-10-CM | POA: Diagnosis not present

## 2015-01-16 ENCOUNTER — Encounter (HOSPITAL_COMMUNITY)
Admission: RE | Admit: 2015-01-16 | Discharge: 2015-01-16 | Disposition: A | Discharge status: Home / Self Care | Payer: Medicare Other | Source: Ambulatory Visit | Attending: Nephrology | Admitting: Nephrology

## 2015-01-16 DIAGNOSIS — D631 Anemia in chronic kidney disease: Secondary | ICD-10-CM | POA: Insufficient documentation

## 2015-01-16 DIAGNOSIS — N184 Chronic kidney disease, stage 4 (severe): Secondary | ICD-10-CM | POA: Insufficient documentation

## 2015-01-16 LAB — IRON AND TIBC
IRON: 81 ug/dL (ref 28–170)
SATURATION RATIOS: 35 % — AB (ref 10.4–31.8)
TIBC: 232 ug/dL — AB (ref 250–450)
UIBC: 151 ug/dL

## 2015-01-16 LAB — FERRITIN: Ferritin: 1002 ng/mL — ABNORMAL HIGH (ref 11–307)

## 2015-01-16 LAB — POCT HEMOGLOBIN-HEMACUE: Hemoglobin: 11.6 g/dL — ABNORMAL LOW (ref 12.0–15.0)

## 2015-01-16 MED ORDER — EPOETIN ALFA 10000 UNIT/ML IJ SOLN
INTRAMUSCULAR | Status: AC
Start: 2015-01-16 — End: 2015-01-16
  Administered 2015-01-16: 10000 [IU]
  Filled 2015-01-16: qty 1

## 2015-01-16 MED ORDER — EPOETIN ALFA 20000 UNIT/ML IJ SOLN
INTRAMUSCULAR | Status: AC
  Administered 2015-01-16: 20000 [IU]
  Filled 2015-01-16: qty 1

## 2015-01-16 MED ORDER — EPOETIN ALFA 10000 UNIT/ML IJ SOLN: 30000.0000 [IU] | INTRAMUSCULAR | Status: DC

## 2015-01-29 ENCOUNTER — Other Ambulatory Visit (HOSPITAL_COMMUNITY): Payer: Self-pay | Admitting: *Deleted

## 2015-01-30 ENCOUNTER — Encounter (HOSPITAL_COMMUNITY)
Admission: RE | Admit: 2015-01-30 | Discharge: 2015-01-30 | Disposition: A | Discharge status: Home / Self Care | Payer: Medicare Other | Source: Ambulatory Visit | Attending: Nephrology | Admitting: Nephrology

## 2015-01-30 DIAGNOSIS — D631 Anemia in chronic kidney disease: Secondary | ICD-10-CM | POA: Diagnosis not present

## 2015-01-30 DIAGNOSIS — N184 Chronic kidney disease, stage 4 (severe): Secondary | ICD-10-CM | POA: Diagnosis not present

## 2015-01-30 LAB — POCT HEMOGLOBIN-HEMACUE: HEMOGLOBIN: 11.9 g/dL — AB (ref 12.0–15.0)

## 2015-01-30 MED ORDER — EPOETIN ALFA 10000 UNIT/ML IJ SOLN: 30000.0000 [IU] | INTRAMUSCULAR | Status: DC

## 2015-01-30 MED ORDER — EPOETIN ALFA 20000 UNIT/ML IJ SOLN
INTRAMUSCULAR | Status: AC
Start: 2015-01-30 — End: 2015-01-30
  Administered 2015-01-30: 20000 [IU]
  Filled 2015-01-30: qty 1

## 2015-01-30 MED ORDER — EPOETIN ALFA 10000 UNIT/ML IJ SOLN
INTRAMUSCULAR | Status: AC
  Administered 2015-01-30: 10000 [IU]
  Filled 2015-01-30: qty 1

## 2015-02-13 ENCOUNTER — Encounter (HOSPITAL_COMMUNITY)
Admission: RE | Admit: 2015-02-13 | Discharge: 2015-02-13 | Disposition: A | Discharge status: Home / Self Care | Payer: Medicare Other | Source: Ambulatory Visit | Attending: Nephrology | Admitting: Nephrology

## 2015-02-13 DIAGNOSIS — N184 Chronic kidney disease, stage 4 (severe): Secondary | ICD-10-CM | POA: Insufficient documentation

## 2015-02-13 DIAGNOSIS — D631 Anemia in chronic kidney disease: Secondary | ICD-10-CM | POA: Diagnosis not present

## 2015-02-13 LAB — IRON AND TIBC
Iron: 82 ug/dL (ref 28–170)
SATURATION RATIOS: 35 % — AB (ref 10.4–31.8)
TIBC: 234 ug/dL — ABNORMAL LOW (ref 250–450)
UIBC: 152 ug/dL

## 2015-02-13 LAB — POCT HEMOGLOBIN-HEMACUE: Hemoglobin: 12 g/dL (ref 12.0–15.0)

## 2015-02-13 LAB — FERRITIN: Ferritin: 920 ng/mL — ABNORMAL HIGH (ref 11–307)

## 2015-02-13 MED ORDER — EPOETIN ALFA 10000 UNIT/ML IJ SOLN: 30000.0000 [IU] | INTRAMUSCULAR | Status: DC

## 2015-02-27 ENCOUNTER — Encounter (HOSPITAL_COMMUNITY)
Admission: RE | Admit: 2015-02-27 | Discharge: 2015-02-27 | Disposition: A | Discharge status: Home / Self Care | Payer: Medicare Other | Source: Ambulatory Visit | Attending: Nephrology | Admitting: Nephrology

## 2015-02-27 DIAGNOSIS — D631 Anemia in chronic kidney disease: Secondary | ICD-10-CM | POA: Diagnosis not present

## 2015-02-27 DIAGNOSIS — N184 Chronic kidney disease, stage 4 (severe): Secondary | ICD-10-CM | POA: Diagnosis not present

## 2015-02-27 LAB — POCT HEMOGLOBIN-HEMACUE: Hemoglobin: 11 g/dL — ABNORMAL LOW (ref 12.0–15.0)

## 2015-02-27 MED ORDER — EPOETIN ALFA 20000 UNIT/ML IJ SOLN
INTRAMUSCULAR | Status: AC
  Filled 2015-02-27: qty 1

## 2015-02-27 MED ORDER — EPOETIN ALFA 10000 UNIT/ML IJ SOLN
INTRAMUSCULAR | Status: AC
  Administered 2015-02-27: 30000 [IU] via SUBCUTANEOUS
  Filled 2015-02-27: qty 1

## 2015-02-27 MED ORDER — EPOETIN ALFA 10000 UNIT/ML IJ SOLN
30000.0000 [IU] | INTRAMUSCULAR | Status: DC
  Administered 2015-02-27: 30000 [IU] via SUBCUTANEOUS

## 2015-02-28 MED FILL — Epoetin Alfa Inj 20000 Unit/ML: INTRAMUSCULAR | Qty: 1 | Status: AC

## 2015-02-28 MED FILL — Epoetin Alfa Inj 10000 Unit/ML: INTRAMUSCULAR | Qty: 1 | Status: AC

## 2015-03-13 ENCOUNTER — Encounter (HOSPITAL_COMMUNITY)
Admission: RE | Admit: 2015-03-13 | Discharge: 2015-03-13 | Disposition: A | Discharge status: Home / Self Care | Payer: Medicare Other | Source: Ambulatory Visit | Attending: Nephrology | Admitting: Nephrology

## 2015-03-13 DIAGNOSIS — D631 Anemia in chronic kidney disease: Secondary | ICD-10-CM | POA: Insufficient documentation

## 2015-03-13 DIAGNOSIS — N184 Chronic kidney disease, stage 4 (severe): Secondary | ICD-10-CM | POA: Diagnosis not present

## 2015-03-13 LAB — IRON AND TIBC
IRON: 76 ug/dL (ref 28–170)
Saturation Ratios: 31 % (ref 10.4–31.8)
TIBC: 244 ug/dL — ABNORMAL LOW (ref 250–450)
UIBC: 168 ug/dL

## 2015-03-13 LAB — POCT HEMOGLOBIN-HEMACUE: Hemoglobin: 10.9 g/dL — ABNORMAL LOW (ref 12.0–15.0)

## 2015-03-13 LAB — FERRITIN: FERRITIN: 895 ng/mL — AB (ref 11–307)

## 2015-03-13 MED ORDER — EPOETIN ALFA 10000 UNIT/ML IJ SOLN: 30000.0000 [IU] | INTRAMUSCULAR | Status: DC

## 2015-03-13 MED ORDER — EPOETIN ALFA 20000 UNIT/ML IJ SOLN
INTRAMUSCULAR | Status: AC
  Administered 2015-03-13: 20000 [IU] via SUBCUTANEOUS
  Filled 2015-03-13: qty 1

## 2015-03-13 MED ORDER — EPOETIN ALFA 10000 UNIT/ML IJ SOLN
INTRAMUSCULAR | Status: AC
  Administered 2015-03-13: 10000 [IU] via SUBCUTANEOUS
  Filled 2015-03-13: qty 1

## 2015-03-24 DIAGNOSIS — M109 Gout, unspecified: Secondary | ICD-10-CM | POA: Diagnosis not present

## 2015-03-24 DIAGNOSIS — E785 Hyperlipidemia, unspecified: Secondary | ICD-10-CM | POA: Diagnosis not present

## 2015-03-24 DIAGNOSIS — N185 Chronic kidney disease, stage 5: Secondary | ICD-10-CM | POA: Diagnosis not present

## 2015-03-24 DIAGNOSIS — Z6835 Body mass index (BMI) 35.0-35.9, adult: Secondary | ICD-10-CM | POA: Diagnosis not present

## 2015-03-24 DIAGNOSIS — N2889 Other specified disorders of kidney and ureter: Secondary | ICD-10-CM | POA: Diagnosis not present

## 2015-03-24 DIAGNOSIS — E118 Type 2 diabetes mellitus with unspecified complications: Secondary | ICD-10-CM | POA: Diagnosis not present

## 2015-03-24 DIAGNOSIS — N2581 Secondary hyperparathyroidism of renal origin: Secondary | ICD-10-CM | POA: Diagnosis not present

## 2015-03-24 DIAGNOSIS — E1140 Type 2 diabetes mellitus with diabetic neuropathy, unspecified: Secondary | ICD-10-CM | POA: Diagnosis not present

## 2015-03-24 DIAGNOSIS — I10 Essential (primary) hypertension: Secondary | ICD-10-CM | POA: Diagnosis not present

## 2015-03-24 DIAGNOSIS — E669 Obesity, unspecified: Secondary | ICD-10-CM | POA: Diagnosis not present

## 2015-03-27 ENCOUNTER — Encounter (HOSPITAL_COMMUNITY)
Admission: RE | Admit: 2015-03-27 | Discharge: 2015-03-27 | Disposition: A | Discharge status: Home / Self Care | Payer: Medicare Other | Source: Ambulatory Visit | Attending: Nephrology | Admitting: Nephrology

## 2015-03-27 DIAGNOSIS — D631 Anemia in chronic kidney disease: Secondary | ICD-10-CM | POA: Diagnosis not present

## 2015-03-27 DIAGNOSIS — N184 Chronic kidney disease, stage 4 (severe): Secondary | ICD-10-CM | POA: Diagnosis not present

## 2015-03-27 LAB — POCT HEMOGLOBIN-HEMACUE: HEMOGLOBIN: 11 g/dL — AB (ref 12.0–15.0)

## 2015-03-27 MED ORDER — EPOETIN ALFA 10000 UNIT/ML IJ SOLN
INTRAMUSCULAR | Status: AC
  Administered 2015-03-27: 10000 [IU]
  Filled 2015-03-27: qty 1

## 2015-03-27 MED ORDER — EPOETIN ALFA 20000 UNIT/ML IJ SOLN
INTRAMUSCULAR | Status: AC
  Administered 2015-03-27: 20000 [IU]
  Filled 2015-03-27: qty 1

## 2015-03-27 MED ORDER — EPOETIN ALFA 10000 UNIT/ML IJ SOLN: 30000.0000 [IU] | INTRAMUSCULAR | Status: DC

## 2015-04-03 DIAGNOSIS — M109 Gout, unspecified: Secondary | ICD-10-CM | POA: Diagnosis not present

## 2015-04-03 DIAGNOSIS — Z23 Encounter for immunization: Secondary | ICD-10-CM | POA: Diagnosis not present

## 2015-04-03 DIAGNOSIS — I12 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 stage renal disease: Secondary | ICD-10-CM | POA: Diagnosis not present

## 2015-04-03 DIAGNOSIS — E1121 Type 2 diabetes mellitus with diabetic nephropathy: Secondary | ICD-10-CM | POA: Diagnosis not present

## 2015-04-03 DIAGNOSIS — N2581 Secondary hyperparathyroidism of renal origin: Secondary | ICD-10-CM | POA: Diagnosis not present

## 2015-04-03 DIAGNOSIS — N185 Chronic kidney disease, stage 5: Secondary | ICD-10-CM | POA: Diagnosis not present

## 2015-04-03 DIAGNOSIS — D631 Anemia in chronic kidney disease: Secondary | ICD-10-CM | POA: Diagnosis not present

## 2015-04-10 ENCOUNTER — Encounter (HOSPITAL_COMMUNITY)
Admission: RE | Admit: 2015-04-10 | Discharge: 2015-04-10 | Disposition: A | Discharge status: Home / Self Care | Payer: Medicare Other | Source: Ambulatory Visit | Attending: Nephrology | Admitting: Nephrology

## 2015-04-10 DIAGNOSIS — D631 Anemia in chronic kidney disease: Secondary | ICD-10-CM | POA: Diagnosis not present

## 2015-04-10 DIAGNOSIS — N185 Chronic kidney disease, stage 5: Secondary | ICD-10-CM | POA: Diagnosis not present

## 2015-04-10 DIAGNOSIS — N184 Chronic kidney disease, stage 4 (severe): Secondary | ICD-10-CM | POA: Insufficient documentation

## 2015-04-10 LAB — IRON AND TIBC
Iron: 65 ug/dL (ref 28–170)
Saturation Ratios: 27 % (ref 10.4–31.8)
TIBC: 239 ug/dL — ABNORMAL LOW (ref 250–450)
UIBC: 174 ug/dL

## 2015-04-10 LAB — FERRITIN: FERRITIN: 786 ng/mL — AB (ref 11–307)

## 2015-04-10 LAB — POCT HEMOGLOBIN-HEMACUE: HEMOGLOBIN: 11.2 g/dL — AB (ref 12.0–15.0)

## 2015-04-10 MED ORDER — EPOETIN ALFA 10000 UNIT/ML IJ SOLN
INTRAMUSCULAR | Status: AC
  Administered 2015-04-10: 10000 [IU] via SUBCUTANEOUS
  Filled 2015-04-10: qty 1

## 2015-04-10 MED ORDER — EPOETIN ALFA 10000 UNIT/ML IJ SOLN: 30000.0000 [IU] | INTRAMUSCULAR | Status: DC

## 2015-04-10 MED ORDER — EPOETIN ALFA 20000 UNIT/ML IJ SOLN
INTRAMUSCULAR | Status: AC
  Administered 2015-04-10: 20000 [IU] via SUBCUTANEOUS
  Filled 2015-04-10: qty 1

## 2015-04-23 ENCOUNTER — Other Ambulatory Visit (HOSPITAL_COMMUNITY): Payer: Self-pay | Admitting: *Deleted

## 2015-04-24 ENCOUNTER — Encounter (HOSPITAL_COMMUNITY)
Admission: RE | Admit: 2015-04-24 | Discharge: 2015-04-24 | Disposition: A | Discharge status: Home / Self Care | Payer: Medicare Other | Source: Ambulatory Visit | Attending: Nephrology | Admitting: Nephrology

## 2015-04-24 DIAGNOSIS — D631 Anemia in chronic kidney disease: Secondary | ICD-10-CM | POA: Diagnosis not present

## 2015-04-24 DIAGNOSIS — N184 Chronic kidney disease, stage 4 (severe): Secondary | ICD-10-CM | POA: Diagnosis not present

## 2015-04-24 LAB — POCT HEMOGLOBIN-HEMACUE: HEMOGLOBIN: 11.2 g/dL — AB (ref 12.0–15.0)

## 2015-04-24 MED ORDER — EPOETIN ALFA 10000 UNIT/ML IJ SOLN: 30000.0000 [IU] | INTRAMUSCULAR | Status: DC

## 2015-04-24 MED ORDER — SODIUM CHLORIDE 0.9 % IV SOLN
510.0000 mg | Freq: Once | INTRAVENOUS | Status: DC
  Filled 2015-04-24: qty 17

## 2015-04-24 MED ORDER — EPOETIN ALFA 20000 UNIT/ML IJ SOLN
INTRAMUSCULAR | Status: AC
  Administered 2015-04-24: 20000 [IU]
  Filled 2015-04-24: qty 1

## 2015-04-24 MED ORDER — EPOETIN ALFA 10000 UNIT/ML IJ SOLN
INTRAMUSCULAR | Status: AC
  Administered 2015-04-24: 10000 [IU]
  Filled 2015-04-24: qty 1

## 2015-05-08 ENCOUNTER — Encounter (HOSPITAL_COMMUNITY)
Admission: RE | Admit: 2015-05-08 | Discharge: 2015-05-08 | Disposition: A | Discharge status: Home / Self Care | Payer: Medicare Other | Source: Ambulatory Visit | Attending: Nephrology | Admitting: Nephrology

## 2015-05-08 DIAGNOSIS — D631 Anemia in chronic kidney disease: Secondary | ICD-10-CM | POA: Diagnosis not present

## 2015-05-08 DIAGNOSIS — N185 Chronic kidney disease, stage 5: Secondary | ICD-10-CM | POA: Diagnosis not present

## 2015-05-08 DIAGNOSIS — N184 Chronic kidney disease, stage 4 (severe): Secondary | ICD-10-CM | POA: Insufficient documentation

## 2015-05-08 LAB — IRON AND TIBC
Iron: 65 ug/dL (ref 28–170)
SATURATION RATIOS: 30 % (ref 10.4–31.8)
TIBC: 214 ug/dL — ABNORMAL LOW (ref 250–450)
UIBC: 149 ug/dL

## 2015-05-08 LAB — FERRITIN: FERRITIN: 997 ng/mL — AB (ref 11–307)

## 2015-05-08 LAB — POCT HEMOGLOBIN-HEMACUE: HEMOGLOBIN: 12.1 g/dL (ref 12.0–15.0)

## 2015-05-08 MED ORDER — EPOETIN ALFA 10000 UNIT/ML IJ SOLN: 30000.0000 [IU] | INTRAMUSCULAR | Status: DC

## 2015-05-16 DIAGNOSIS — N2889 Other specified disorders of kidney and ureter: Secondary | ICD-10-CM | POA: Diagnosis not present

## 2015-05-16 DIAGNOSIS — E1121 Type 2 diabetes mellitus with diabetic nephropathy: Secondary | ICD-10-CM | POA: Diagnosis not present

## 2015-05-16 DIAGNOSIS — D631 Anemia in chronic kidney disease: Secondary | ICD-10-CM | POA: Diagnosis not present

## 2015-05-16 DIAGNOSIS — E0842 Diabetes mellitus due to underlying condition with diabetic polyneuropathy: Secondary | ICD-10-CM | POA: Diagnosis not present

## 2015-05-16 DIAGNOSIS — E118 Type 2 diabetes mellitus with unspecified complications: Secondary | ICD-10-CM | POA: Diagnosis not present

## 2015-05-16 DIAGNOSIS — I12 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 stage renal disease: Secondary | ICD-10-CM | POA: Diagnosis not present

## 2015-05-16 DIAGNOSIS — I770 Arteriovenous fistula, acquired: Secondary | ICD-10-CM | POA: Diagnosis not present

## 2015-05-16 DIAGNOSIS — E782 Mixed hyperlipidemia: Secondary | ICD-10-CM | POA: Diagnosis not present

## 2015-05-16 DIAGNOSIS — N2581 Secondary hyperparathyroidism of renal origin: Secondary | ICD-10-CM | POA: Diagnosis not present

## 2015-05-16 DIAGNOSIS — M109 Gout, unspecified: Secondary | ICD-10-CM | POA: Diagnosis not present

## 2015-05-16 DIAGNOSIS — N185 Chronic kidney disease, stage 5: Secondary | ICD-10-CM | POA: Diagnosis not present

## 2015-05-21 DIAGNOSIS — N390 Urinary tract infection, site not specified: Secondary | ICD-10-CM | POA: Diagnosis not present

## 2015-05-22 ENCOUNTER — Encounter (HOSPITAL_COMMUNITY)
Admission: RE | Admit: 2015-05-22 | Discharge: 2015-05-22 | Disposition: A | Discharge status: Home / Self Care | Payer: Medicare Other | Source: Ambulatory Visit | Attending: Nephrology | Admitting: Nephrology

## 2015-05-22 DIAGNOSIS — D631 Anemia in chronic kidney disease: Secondary | ICD-10-CM | POA: Diagnosis not present

## 2015-05-22 DIAGNOSIS — N184 Chronic kidney disease, stage 4 (severe): Secondary | ICD-10-CM | POA: Diagnosis not present

## 2015-05-22 LAB — POCT HEMOGLOBIN-HEMACUE: HEMOGLOBIN: 11.2 g/dL — AB (ref 12.0–15.0)

## 2015-05-22 MED ORDER — EPOETIN ALFA 10000 UNIT/ML IJ SOLN: 30000.0000 [IU] | INTRAMUSCULAR | Status: DC

## 2015-05-22 MED ORDER — EPOETIN ALFA 20000 UNIT/ML IJ SOLN
INTRAMUSCULAR | Status: AC
  Administered 2015-05-22: 20000 [IU]
  Filled 2015-05-22: qty 1

## 2015-05-22 MED ORDER — EPOETIN ALFA 10000 UNIT/ML IJ SOLN
INTRAMUSCULAR | Status: AC
  Administered 2015-05-22: 10000 [IU]
  Filled 2015-05-22: qty 1

## 2015-06-05 ENCOUNTER — Encounter (HOSPITAL_COMMUNITY)
Admission: RE | Admit: 2015-06-05 | Discharge: 2015-06-05 | Disposition: A | Discharge status: Home / Self Care | Payer: Medicare Other | Source: Ambulatory Visit | Attending: Nephrology | Admitting: Nephrology

## 2015-06-05 DIAGNOSIS — D631 Anemia in chronic kidney disease: Secondary | ICD-10-CM | POA: Insufficient documentation

## 2015-06-05 DIAGNOSIS — N184 Chronic kidney disease, stage 4 (severe): Secondary | ICD-10-CM | POA: Insufficient documentation

## 2015-06-05 LAB — IRON AND TIBC
IRON: 71 ug/dL (ref 28–170)
SATURATION RATIOS: 34 % — AB (ref 10.4–31.8)
TIBC: 207 ug/dL — ABNORMAL LOW (ref 250–450)
UIBC: 136 ug/dL

## 2015-06-05 LAB — POCT HEMOGLOBIN-HEMACUE: Hemoglobin: 11 g/dL — ABNORMAL LOW (ref 12.0–15.0)

## 2015-06-05 LAB — FERRITIN: Ferritin: 808 ng/mL — ABNORMAL HIGH (ref 11–307)

## 2015-06-05 MED ORDER — EPOETIN ALFA 20000 UNIT/ML IJ SOLN
INTRAMUSCULAR | Status: AC
  Administered 2015-06-05: 20000 [IU] via SUBCUTANEOUS
  Filled 2015-06-05: qty 1

## 2015-06-05 MED ORDER — EPOETIN ALFA 10000 UNIT/ML IJ SOLN: 30000.0000 [IU] | INTRAMUSCULAR | Status: DC

## 2015-06-05 MED ORDER — EPOETIN ALFA 10000 UNIT/ML IJ SOLN
INTRAMUSCULAR | Status: AC
  Administered 2015-06-05: 10000 [IU] via SUBCUTANEOUS
  Filled 2015-06-05: qty 1

## 2015-06-19 ENCOUNTER — Encounter (HOSPITAL_COMMUNITY)
Admission: RE | Admit: 2015-06-19 | Discharge: 2015-06-19 | Disposition: A | Discharge status: Home / Self Care | Payer: Medicare Other | Source: Ambulatory Visit | Attending: Nephrology | Admitting: Nephrology

## 2015-06-19 DIAGNOSIS — N184 Chronic kidney disease, stage 4 (severe): Secondary | ICD-10-CM | POA: Diagnosis not present

## 2015-06-19 DIAGNOSIS — D631 Anemia in chronic kidney disease: Secondary | ICD-10-CM | POA: Diagnosis not present

## 2015-06-19 LAB — POCT HEMOGLOBIN-HEMACUE: HEMOGLOBIN: 11.4 g/dL — AB (ref 12.0–15.0)

## 2015-06-19 MED ORDER — EPOETIN ALFA 10000 UNIT/ML IJ SOLN
INTRAMUSCULAR | Status: AC
  Administered 2015-06-19: 10000 [IU] via SUBCUTANEOUS
  Filled 2015-06-19: qty 1

## 2015-06-19 MED ORDER — EPOETIN ALFA 20000 UNIT/ML IJ SOLN
INTRAMUSCULAR | Status: AC
  Administered 2015-06-19: 20000 [IU] via SUBCUTANEOUS
  Filled 2015-06-19: qty 1

## 2015-06-19 MED ORDER — EPOETIN ALFA 10000 UNIT/ML IJ SOLN: 30000.0000 [IU] | INTRAMUSCULAR | Status: DC

## 2015-07-03 ENCOUNTER — Encounter (HOSPITAL_COMMUNITY)
Admission: RE | Admit: 2015-07-03 | Discharge: 2015-07-03 | Disposition: A | Discharge status: Home / Self Care | Payer: Medicare Other | Source: Ambulatory Visit | Attending: Nephrology | Admitting: Nephrology

## 2015-07-03 DIAGNOSIS — N184 Chronic kidney disease, stage 4 (severe): Secondary | ICD-10-CM | POA: Diagnosis not present

## 2015-07-03 DIAGNOSIS — D631 Anemia in chronic kidney disease: Secondary | ICD-10-CM | POA: Diagnosis not present

## 2015-07-03 LAB — POCT HEMOGLOBIN-HEMACUE: Hemoglobin: 11.2 g/dL — ABNORMAL LOW (ref 12.0–15.0)

## 2015-07-03 LAB — FERRITIN: FERRITIN: 811 ng/mL — AB (ref 11–307)

## 2015-07-03 LAB — IRON AND TIBC
IRON: 73 ug/dL (ref 28–170)
Saturation Ratios: 34 % — ABNORMAL HIGH (ref 10.4–31.8)
TIBC: 217 ug/dL — ABNORMAL LOW (ref 250–450)
UIBC: 144 ug/dL

## 2015-07-03 MED ORDER — EPOETIN ALFA 10000 UNIT/ML IJ SOLN
INTRAMUSCULAR | Status: AC
Start: 2015-07-03 — End: 2015-07-03
  Administered 2015-07-03: 10000 [IU] via SUBCUTANEOUS
  Filled 2015-07-03: qty 1

## 2015-07-03 MED ORDER — EPOETIN ALFA 10000 UNIT/ML IJ SOLN: 30000.0000 [IU] | INTRAMUSCULAR | Status: DC

## 2015-07-03 MED ORDER — EPOETIN ALFA 20000 UNIT/ML IJ SOLN
INTRAMUSCULAR | Status: AC
  Administered 2015-07-03: 20000 [IU] via SUBCUTANEOUS
  Filled 2015-07-03: qty 1

## 2015-07-17 ENCOUNTER — Inpatient Hospital Stay (HOSPITAL_COMMUNITY): Admission: RE | Admit: 2015-07-17 | Payer: Medicare Other | Source: Ambulatory Visit

## 2015-07-18 ENCOUNTER — Encounter (HOSPITAL_COMMUNITY)
Admission: RE | Admit: 2015-07-18 | Discharge: 2015-07-18 | Disposition: A | Discharge status: Home / Self Care | Payer: Medicare Other | Source: Ambulatory Visit | Attending: Nephrology | Admitting: Nephrology

## 2015-07-18 DIAGNOSIS — N184 Chronic kidney disease, stage 4 (severe): Secondary | ICD-10-CM | POA: Diagnosis not present

## 2015-07-18 DIAGNOSIS — D631 Anemia in chronic kidney disease: Secondary | ICD-10-CM | POA: Diagnosis not present

## 2015-07-18 LAB — POCT HEMOGLOBIN-HEMACUE: HEMOGLOBIN: 11.5 g/dL — AB (ref 12.0–15.0)

## 2015-07-18 MED ORDER — EPOETIN ALFA 10000 UNIT/ML IJ SOLN: 30000.0000 [IU] | INTRAMUSCULAR | Status: DC

## 2015-07-18 MED ORDER — EPOETIN ALFA 10000 UNIT/ML IJ SOLN
INTRAMUSCULAR | Status: AC
  Administered 2015-07-18: 10000 [IU] via SUBCUTANEOUS
  Filled 2015-07-18: qty 1

## 2015-07-18 MED ORDER — EPOETIN ALFA 20000 UNIT/ML IJ SOLN
INTRAMUSCULAR | Status: AC
  Administered 2015-07-18: 20000 [IU] via SUBCUTANEOUS
  Filled 2015-07-18: qty 1

## 2015-07-31 ENCOUNTER — Encounter (HOSPITAL_COMMUNITY)
Admission: RE | Admit: 2015-07-31 | Discharge: 2015-07-31 | Disposition: A | Discharge status: Home / Self Care | Payer: Medicare Other | Source: Ambulatory Visit | Attending: Nephrology | Admitting: Nephrology

## 2015-07-31 DIAGNOSIS — N184 Chronic kidney disease, stage 4 (severe): Secondary | ICD-10-CM | POA: Diagnosis not present

## 2015-07-31 DIAGNOSIS — D631 Anemia in chronic kidney disease: Secondary | ICD-10-CM | POA: Diagnosis not present

## 2015-07-31 LAB — POCT HEMOGLOBIN-HEMACUE: Hemoglobin: 11.4 g/dL — ABNORMAL LOW (ref 12.0–15.0)

## 2015-07-31 LAB — IRON AND TIBC
Iron: 68 ug/dL (ref 28–170)
SATURATION RATIOS: 31 % (ref 10.4–31.8)
TIBC: 218 ug/dL — AB (ref 250–450)
UIBC: 150 ug/dL

## 2015-07-31 LAB — FERRITIN: Ferritin: 794 ng/mL — ABNORMAL HIGH (ref 11–307)

## 2015-07-31 MED ORDER — EPOETIN ALFA 20000 UNIT/ML IJ SOLN
INTRAMUSCULAR | Status: AC
  Administered 2015-07-31: 20000 [IU]
  Filled 2015-07-31: qty 1

## 2015-07-31 MED ORDER — EPOETIN ALFA 10000 UNIT/ML IJ SOLN
INTRAMUSCULAR | Status: AC
  Administered 2015-07-31: 10000 [IU]
  Filled 2015-07-31: qty 1

## 2015-07-31 MED ORDER — EPOETIN ALFA 10000 UNIT/ML IJ SOLN: 30000.0000 [IU] | INTRAMUSCULAR | Status: DC

## 2015-08-14 ENCOUNTER — Encounter (HOSPITAL_COMMUNITY)
Admission: RE | Admit: 2015-08-14 | Discharge: 2015-08-14 | Disposition: A | Discharge status: Home / Self Care | Payer: Medicare Other | Source: Ambulatory Visit | Attending: Nephrology | Admitting: Nephrology

## 2015-08-14 DIAGNOSIS — N184 Chronic kidney disease, stage 4 (severe): Secondary | ICD-10-CM | POA: Insufficient documentation

## 2015-08-14 DIAGNOSIS — D631 Anemia in chronic kidney disease: Secondary | ICD-10-CM | POA: Diagnosis not present

## 2015-08-14 LAB — POCT HEMOGLOBIN-HEMACUE: Hemoglobin: 11.6 g/dL — ABNORMAL LOW (ref 12.0–15.0)

## 2015-08-14 MED ORDER — EPOETIN ALFA 10000 UNIT/ML IJ SOLN
INTRAMUSCULAR | Status: AC
  Administered 2015-08-14: 10000 [IU]
  Filled 2015-08-14: qty 1

## 2015-08-14 MED ORDER — EPOETIN ALFA 10000 UNIT/ML IJ SOLN: 30000.0000 [IU] | INTRAMUSCULAR | Status: DC

## 2015-08-14 MED ORDER — EPOETIN ALFA 20000 UNIT/ML IJ SOLN
INTRAMUSCULAR | Status: AC
  Administered 2015-08-14: 20000 [IU]
  Filled 2015-08-14: qty 1

## 2015-08-22 DIAGNOSIS — N390 Urinary tract infection, site not specified: Secondary | ICD-10-CM | POA: Diagnosis not present

## 2015-08-22 DIAGNOSIS — N2889 Other specified disorders of kidney and ureter: Secondary | ICD-10-CM | POA: Diagnosis not present

## 2015-08-22 DIAGNOSIS — E0842 Diabetes mellitus due to underlying condition with diabetic polyneuropathy: Secondary | ICD-10-CM | POA: Diagnosis not present

## 2015-08-22 DIAGNOSIS — I12 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 stage renal disease: Secondary | ICD-10-CM | POA: Diagnosis not present

## 2015-08-22 DIAGNOSIS — N2581 Secondary hyperparathyroidism of renal origin: Secondary | ICD-10-CM | POA: Diagnosis not present

## 2015-08-22 DIAGNOSIS — E118 Type 2 diabetes mellitus with unspecified complications: Secondary | ICD-10-CM | POA: Diagnosis not present

## 2015-08-22 DIAGNOSIS — M109 Gout, unspecified: Secondary | ICD-10-CM | POA: Diagnosis not present

## 2015-08-22 DIAGNOSIS — E782 Mixed hyperlipidemia: Secondary | ICD-10-CM | POA: Diagnosis not present

## 2015-08-22 DIAGNOSIS — D631 Anemia in chronic kidney disease: Secondary | ICD-10-CM | POA: Diagnosis not present

## 2015-08-22 DIAGNOSIS — I770 Arteriovenous fistula, acquired: Secondary | ICD-10-CM | POA: Diagnosis not present

## 2015-08-22 DIAGNOSIS — E1121 Type 2 diabetes mellitus with diabetic nephropathy: Secondary | ICD-10-CM | POA: Diagnosis not present

## 2015-08-22 DIAGNOSIS — N185 Chronic kidney disease, stage 5: Secondary | ICD-10-CM | POA: Diagnosis not present

## 2015-08-28 ENCOUNTER — Encounter (HOSPITAL_COMMUNITY)
Admission: RE | Admit: 2015-08-28 | Discharge: 2015-08-28 | Disposition: A | Discharge status: Home / Self Care | Payer: Medicare Other | Source: Ambulatory Visit | Attending: Nephrology | Admitting: Nephrology

## 2015-08-28 DIAGNOSIS — D631 Anemia in chronic kidney disease: Secondary | ICD-10-CM | POA: Diagnosis not present

## 2015-08-28 DIAGNOSIS — N184 Chronic kidney disease, stage 4 (severe): Secondary | ICD-10-CM | POA: Diagnosis not present

## 2015-08-28 LAB — IRON AND TIBC
IRON: 61 ug/dL (ref 28–170)
Saturation Ratios: 26 % (ref 10.4–31.8)
TIBC: 231 ug/dL — ABNORMAL LOW (ref 250–450)
UIBC: 170 ug/dL

## 2015-08-28 LAB — FERRITIN: FERRITIN: 818 ng/mL — AB (ref 11–307)

## 2015-08-28 LAB — POCT HEMOGLOBIN-HEMACUE: HEMOGLOBIN: 12.4 g/dL (ref 12.0–15.0)

## 2015-08-28 MED ORDER — EPOETIN ALFA 10000 UNIT/ML IJ SOLN: 30000.0000 [IU] | INTRAMUSCULAR | Status: DC

## 2015-08-28 MED ORDER — EPOETIN ALFA 10000 UNIT/ML IJ SOLN
INTRAMUSCULAR | Status: AC
  Filled 2015-08-28: qty 1

## 2015-08-28 MED ORDER — EPOETIN ALFA 20000 UNIT/ML IJ SOLN
INTRAMUSCULAR | Status: AC
  Filled 2015-08-28: qty 1

## 2015-09-10 ENCOUNTER — Other Ambulatory Visit (HOSPITAL_COMMUNITY): Payer: Self-pay | Admitting: *Deleted

## 2015-09-11 ENCOUNTER — Ambulatory Visit (HOSPITAL_COMMUNITY)
Admission: RE | Admit: 2015-09-11 | Discharge: 2015-09-11 | Disposition: A | Discharge status: Home / Self Care | Payer: Medicare Other | Source: Ambulatory Visit | Attending: Nephrology | Admitting: Nephrology

## 2015-09-11 DIAGNOSIS — N184 Chronic kidney disease, stage 4 (severe): Secondary | ICD-10-CM | POA: Insufficient documentation

## 2015-09-11 DIAGNOSIS — D631 Anemia in chronic kidney disease: Secondary | ICD-10-CM | POA: Insufficient documentation

## 2015-09-11 DIAGNOSIS — Z79899 Other long term (current) drug therapy: Secondary | ICD-10-CM | POA: Diagnosis not present

## 2015-09-11 DIAGNOSIS — D509 Iron deficiency anemia, unspecified: Secondary | ICD-10-CM | POA: Insufficient documentation

## 2015-09-11 DIAGNOSIS — Z5181 Encounter for therapeutic drug level monitoring: Secondary | ICD-10-CM | POA: Diagnosis not present

## 2015-09-11 LAB — IRON AND TIBC
IRON: 116 ug/dL (ref 28–170)
Saturation Ratios: 53 % — ABNORMAL HIGH (ref 10.4–31.8)
TIBC: 220 ug/dL — AB (ref 250–450)
UIBC: 104 ug/dL

## 2015-09-11 LAB — FERRITIN: FERRITIN: 776 ng/mL — AB (ref 11–307)

## 2015-09-11 LAB — POCT HEMOGLOBIN-HEMACUE: Hemoglobin: 11.4 g/dL — ABNORMAL LOW (ref 12.0–15.0)

## 2015-09-11 MED ORDER — EPOETIN ALFA 10000 UNIT/ML IJ SOLN
INTRAMUSCULAR | Status: AC
  Administered 2015-09-11: 10000 [IU] via SUBCUTANEOUS
  Filled 2015-09-11: qty 1

## 2015-09-11 MED ORDER — EPOETIN ALFA 20000 UNIT/ML IJ SOLN
INTRAMUSCULAR | Status: AC
  Administered 2015-09-11: 20000 [IU]
  Filled 2015-09-11: qty 1

## 2015-09-11 MED ORDER — EPOETIN ALFA 10000 UNIT/ML IJ SOLN
30000.0000 [IU] | INTRAMUSCULAR | Status: DC
  Administered 2015-09-11: 10000 [IU] via SUBCUTANEOUS

## 2015-09-11 MED ORDER — FERUMOXYTOL INJECTION 510 MG/17 ML
510.0000 mg | Freq: Once | INTRAVENOUS | Status: AC
  Administered 2015-09-11: 510 mg via INTRAVENOUS
  Filled 2015-09-11: qty 17

## 2015-09-22 DIAGNOSIS — N185 Chronic kidney disease, stage 5: Secondary | ICD-10-CM | POA: Diagnosis not present

## 2015-09-22 DIAGNOSIS — E669 Obesity, unspecified: Secondary | ICD-10-CM | POA: Diagnosis not present

## 2015-09-22 DIAGNOSIS — N2581 Secondary hyperparathyroidism of renal origin: Secondary | ICD-10-CM | POA: Diagnosis not present

## 2015-09-22 DIAGNOSIS — I10 Essential (primary) hypertension: Secondary | ICD-10-CM | POA: Diagnosis not present

## 2015-09-22 DIAGNOSIS — M109 Gout, unspecified: Secondary | ICD-10-CM | POA: Diagnosis not present

## 2015-09-22 DIAGNOSIS — E1140 Type 2 diabetes mellitus with diabetic neuropathy, unspecified: Secondary | ICD-10-CM | POA: Diagnosis not present

## 2015-09-22 DIAGNOSIS — E785 Hyperlipidemia, unspecified: Secondary | ICD-10-CM | POA: Diagnosis not present

## 2015-09-22 DIAGNOSIS — Z7984 Long term (current) use of oral hypoglycemic drugs: Secondary | ICD-10-CM | POA: Diagnosis not present

## 2015-09-25 ENCOUNTER — Encounter (HOSPITAL_COMMUNITY)
Admission: RE | Admit: 2015-09-25 | Discharge: 2015-09-25 | Disposition: A | Discharge status: Home / Self Care | Payer: Medicare Other | Source: Ambulatory Visit | Attending: Nephrology | Admitting: Nephrology

## 2015-09-25 DIAGNOSIS — D631 Anemia in chronic kidney disease: Secondary | ICD-10-CM | POA: Diagnosis not present

## 2015-09-25 DIAGNOSIS — N184 Chronic kidney disease, stage 4 (severe): Secondary | ICD-10-CM | POA: Insufficient documentation

## 2015-09-25 LAB — POCT HEMOGLOBIN-HEMACUE: HEMOGLOBIN: 11.6 g/dL — AB (ref 12.0–15.0)

## 2015-09-25 MED ORDER — EPOETIN ALFA 20000 UNIT/ML IJ SOLN
INTRAMUSCULAR | Status: AC
  Filled 2015-09-25: qty 1

## 2015-09-25 MED ORDER — EPOETIN ALFA 10000 UNIT/ML IJ SOLN
INTRAMUSCULAR | Status: AC
  Administered 2015-09-25: 30000 [IU] via SUBCUTANEOUS
  Filled 2015-09-25: qty 1

## 2015-09-25 MED ORDER — EPOETIN ALFA 10000 UNIT/ML IJ SOLN
30000.0000 [IU] | INTRAMUSCULAR | Status: DC
  Administered 2015-09-25: 30000 [IU] via SUBCUTANEOUS

## 2015-09-26 MED FILL — Epoetin Alfa Inj 10000 Unit/ML: INTRAMUSCULAR | Qty: 1 | Status: AC

## 2015-09-26 MED FILL — Epoetin Alfa Inj 20000 Unit/ML: INTRAMUSCULAR | Qty: 1 | Status: AC

## 2015-09-30 ENCOUNTER — Encounter: Payer: Self-pay | Admitting: Dietician

## 2015-09-30 ENCOUNTER — Encounter: Payer: Medicare Other | Attending: Family Medicine | Admitting: Dietician

## 2015-09-30 VITALS — Ht 65.0 in | Wt 207.0 lb

## 2015-09-30 DIAGNOSIS — E669 Obesity, unspecified: Secondary | ICD-10-CM | POA: Diagnosis not present

## 2015-09-30 DIAGNOSIS — N185 Chronic kidney disease, stage 5: Secondary | ICD-10-CM | POA: Insufficient documentation

## 2015-09-30 NOTE — Patient Instructions (Addendum)
Exercise as you are able and allowed by your doctor. Avoid orange juice.  Choose a lower potassium fruit instead. See your book for portion sizes and total amounts. Avoid added salt.  Choose fresh meat, fresh vegetables, vegetables frozen or canned without salt. See book for low potassium food choices.

## 2015-09-30 NOTE — Progress Notes (Signed)
  Medical Nutrition Therapy:  Appt start time: 1100 end time:  1215.   Assessment:  Primary concerns today: Patient is here alone.  She states that she needs to know about the diet for her kidneys and lose weight.  Referral is for obesity and CKD stage 5.  She states that she is on the transplant list.  Hx includes:  Type 2 diabetes, HTN, Hyperlipidemia, and CVA.  No labs are available and patient does not know her last HgbA1C.  She states that she has a fistula for HD if/when needed. Weight overall has been stable.   Lowest weight 180 lbs six years ago when she used to walk more.  Highest weight 230 lbs about 9 year ago and lost with walking and increased activity with work.  She is unable to exercise due to knee pain.  She lives alone.  She does her shopping and cooking.  Daughter helps her.  She does not currently have a car.   Preferred Learning Style:   No preference indicated   Learning Readiness:   Ready  MEDICATIONS: see list to include Glucotrol   DIETARY INTAKE: She bakes most of her food and cooks with herbs but does use a little salt.  Uses sugar at times.  She states that her potassium is high at times.  She gets food stamps and has to be very careful with her food budget.  Her daughter helps out with food at times as needed.  Usual eating pattern includes 3 meals and 1-2 snacks per day.  24-hr recall:  B ( AM): plain oatmeal with 1 Tablespoon sugar, 1 ounce milk, 1 cup orange juice Snk ( AM): none  L ( PM): Peanut bread on [employer] bread Snk ( PM): crackers with cheese (homemade) D ( PM): meatloaf, peas, mashed potatoes or potato salad OR spaghetti OR baked chicken with rice or potatoes, and vegetables Snk ( PM): peanut butter and crackers Beverages: water, orange juice, sweet tea, coffee with splenda, liquid creamer, occasional soda (regular or sugar free)  Usual physical activity: ADL's   Estimated energy needs: 1700 calories 230 g carbohydrates 60 g protein 60 g  fat  Progress Towards Goal(s):  In progress.   Nutritional Diagnosis:  NB-1.1 Food and nutrition-related knowledge deficit As related to diet for stage 5 CKD.  As evidenced by patient report.    Intervention:  Nutrition education regarding a low potassium, low sodium, low phosphorous, 60 gram protein diet for patient with stage 5 CKD not yet dialysis requiring.  Discussed that meal plan will change when dialysis is initiated.  Patient may lose weight on these guidelines but more as a result of her disease.  Guidelines appropriate for diabetes as well.  Exercise as you are able and allowed by your doctor. Avoid orange juice.  Choose a lower potassium fruit instead. See your book for portion sizes and total amounts. Avoid added salt.  Choose fresh meat, fresh vegetables, vegetables frozen or canned without salt. See book for low potassium food choices.  Teaching Method Utilized:  Visual Auditory Hands on  Handouts given during visit include:  Low protein tips  Renal Choose a Meal Book  Barriers to learning/adherence to lifestyle change: none  Demonstrated degree of understanding via:  Teach Back   Monitoring/Evaluation:  Dietary intake, exercise, and body weight prn.

## 2015-10-03 DIAGNOSIS — N185 Chronic kidney disease, stage 5: Secondary | ICD-10-CM | POA: Diagnosis not present

## 2015-10-03 DIAGNOSIS — E875 Hyperkalemia: Secondary | ICD-10-CM | POA: Diagnosis not present

## 2015-10-09 ENCOUNTER — Encounter (HOSPITAL_COMMUNITY)
Admission: RE | Admit: 2015-10-09 | Discharge: 2015-10-09 | Disposition: A | Discharge status: Home / Self Care | Payer: Medicare Other | Source: Ambulatory Visit | Attending: Nephrology | Admitting: Nephrology

## 2015-10-09 DIAGNOSIS — N184 Chronic kidney disease, stage 4 (severe): Secondary | ICD-10-CM | POA: Diagnosis not present

## 2015-10-09 DIAGNOSIS — D631 Anemia in chronic kidney disease: Secondary | ICD-10-CM | POA: Insufficient documentation

## 2015-10-09 LAB — IRON AND TIBC
Iron: 73 ug/dL (ref 28–170)
SATURATION RATIOS: 33 % — AB (ref 10.4–31.8)
TIBC: 220 ug/dL — AB (ref 250–450)
UIBC: 147 ug/dL

## 2015-10-09 LAB — POCT HEMOGLOBIN-HEMACUE: Hemoglobin: 11.9 g/dL — ABNORMAL LOW (ref 12.0–15.0)

## 2015-10-09 LAB — FERRITIN: Ferritin: 1045 ng/mL — ABNORMAL HIGH (ref 11–307)

## 2015-10-09 MED ORDER — EPOETIN ALFA 20000 UNIT/ML IJ SOLN
INTRAMUSCULAR | Status: AC
  Administered 2015-10-09: 20000 [IU] via SUBCUTANEOUS
  Filled 2015-10-09: qty 1

## 2015-10-09 MED ORDER — EPOETIN ALFA 10000 UNIT/ML IJ SOLN
INTRAMUSCULAR | Status: AC
  Administered 2015-10-09: 10000 [IU] via SUBCUTANEOUS
  Filled 2015-10-09: qty 1

## 2015-10-09 MED ORDER — EPOETIN ALFA 10000 UNIT/ML IJ SOLN: 30000.0000 [IU] | INTRAMUSCULAR | Status: DC

## 2015-10-13 DIAGNOSIS — E875 Hyperkalemia: Secondary | ICD-10-CM | POA: Diagnosis not present

## 2015-10-22 ENCOUNTER — Other Ambulatory Visit (HOSPITAL_COMMUNITY): Payer: Self-pay | Admitting: *Deleted

## 2015-10-23 ENCOUNTER — Encounter (HOSPITAL_COMMUNITY)
Admission: RE | Admit: 2015-10-23 | Discharge: 2015-10-23 | Disposition: A | Discharge status: Home / Self Care | Payer: Medicare Other | Source: Ambulatory Visit | Attending: Nephrology | Admitting: Nephrology

## 2015-10-23 DIAGNOSIS — D631 Anemia in chronic kidney disease: Secondary | ICD-10-CM | POA: Diagnosis not present

## 2015-10-23 DIAGNOSIS — N184 Chronic kidney disease, stage 4 (severe): Secondary | ICD-10-CM | POA: Diagnosis not present

## 2015-10-23 LAB — POCT HEMOGLOBIN-HEMACUE: HEMOGLOBIN: 11.9 g/dL — AB (ref 12.0–15.0)

## 2015-10-23 MED ORDER — EPOETIN ALFA 10000 UNIT/ML IJ SOLN
INTRAMUSCULAR | Status: AC
  Administered 2015-10-23: 10000 [IU] via SUBCUTANEOUS
  Filled 2015-10-23: qty 1

## 2015-10-23 MED ORDER — EPOETIN ALFA 10000 UNIT/ML IJ SOLN: 30000.0000 [IU] | INTRAMUSCULAR | Status: DC

## 2015-10-23 MED ORDER — EPOETIN ALFA 20000 UNIT/ML IJ SOLN
INTRAMUSCULAR | Status: AC
  Administered 2015-10-23: 20000 [IU] via SUBCUTANEOUS
  Filled 2015-10-23: qty 1

## 2015-10-31 DIAGNOSIS — E1121 Type 2 diabetes mellitus with diabetic nephropathy: Secondary | ICD-10-CM | POA: Diagnosis not present

## 2015-10-31 DIAGNOSIS — N2581 Secondary hyperparathyroidism of renal origin: Secondary | ICD-10-CM | POA: Diagnosis not present

## 2015-10-31 DIAGNOSIS — M109 Gout, unspecified: Secondary | ICD-10-CM | POA: Diagnosis not present

## 2015-10-31 DIAGNOSIS — E118 Type 2 diabetes mellitus with unspecified complications: Secondary | ICD-10-CM | POA: Diagnosis not present

## 2015-10-31 DIAGNOSIS — N2889 Other specified disorders of kidney and ureter: Secondary | ICD-10-CM | POA: Diagnosis not present

## 2015-10-31 DIAGNOSIS — E0842 Diabetes mellitus due to underlying condition with diabetic polyneuropathy: Secondary | ICD-10-CM | POA: Diagnosis not present

## 2015-10-31 DIAGNOSIS — I12 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 stage renal disease: Secondary | ICD-10-CM | POA: Diagnosis not present

## 2015-10-31 DIAGNOSIS — D631 Anemia in chronic kidney disease: Secondary | ICD-10-CM | POA: Diagnosis not present

## 2015-10-31 DIAGNOSIS — N185 Chronic kidney disease, stage 5: Secondary | ICD-10-CM | POA: Diagnosis not present

## 2015-10-31 DIAGNOSIS — E782 Mixed hyperlipidemia: Secondary | ICD-10-CM | POA: Diagnosis not present

## 2015-10-31 DIAGNOSIS — I770 Arteriovenous fistula, acquired: Secondary | ICD-10-CM | POA: Diagnosis not present

## 2015-11-06 ENCOUNTER — Encounter (HOSPITAL_COMMUNITY)
Admission: RE | Admit: 2015-11-06 | Discharge: 2015-11-06 | Disposition: A | Discharge status: Home / Self Care | Payer: Medicare HMO | Source: Ambulatory Visit | Attending: Nephrology | Admitting: Nephrology

## 2015-11-06 DIAGNOSIS — N185 Chronic kidney disease, stage 5: Secondary | ICD-10-CM | POA: Diagnosis not present

## 2015-11-06 DIAGNOSIS — N184 Chronic kidney disease, stage 4 (severe): Secondary | ICD-10-CM | POA: Diagnosis not present

## 2015-11-06 DIAGNOSIS — D631 Anemia in chronic kidney disease: Secondary | ICD-10-CM | POA: Diagnosis present

## 2015-11-06 LAB — POCT HEMOGLOBIN-HEMACUE: Hemoglobin: 11.9 g/dL — ABNORMAL LOW (ref 12.0–15.0)

## 2015-11-06 LAB — IRON AND TIBC
IRON: 63 ug/dL (ref 28–170)
Saturation Ratios: 30 % (ref 10.4–31.8)
TIBC: 209 ug/dL — AB (ref 250–450)
UIBC: 146 ug/dL

## 2015-11-06 LAB — FERRITIN: Ferritin: 911 ng/mL — ABNORMAL HIGH (ref 11–307)

## 2015-11-06 MED ORDER — EPOETIN ALFA 10000 UNIT/ML IJ SOLN: 30000.0000 [IU] | INTRAMUSCULAR | Status: DC

## 2015-11-06 MED ORDER — EPOETIN ALFA 10000 UNIT/ML IJ SOLN
INTRAMUSCULAR | Status: AC
Start: 2015-11-06 — End: 2015-11-06
  Administered 2015-11-06: 10000 [IU] via SUBCUTANEOUS
  Filled 2015-11-06: qty 1

## 2015-11-06 MED ORDER — EPOETIN ALFA 20000 UNIT/ML IJ SOLN
INTRAMUSCULAR | Status: AC
Start: 2015-11-06 — End: 2015-11-06
  Administered 2015-11-06: 20000 [IU] via SUBCUTANEOUS
  Filled 2015-11-06: qty 1

## 2015-11-20 ENCOUNTER — Encounter (HOSPITAL_COMMUNITY)
Admission: RE | Admit: 2015-11-20 | Discharge: 2015-11-20 | Disposition: A | Discharge status: Home / Self Care | Payer: Medicare HMO | Source: Ambulatory Visit | Attending: Nephrology | Admitting: Nephrology

## 2015-11-20 DIAGNOSIS — D631 Anemia in chronic kidney disease: Secondary | ICD-10-CM | POA: Diagnosis not present

## 2015-11-20 MED ORDER — EPOETIN ALFA 10000 UNIT/ML IJ SOLN: 30000.0000 [IU] | INTRAMUSCULAR | Status: DC

## 2015-11-20 MED ORDER — EPOETIN ALFA 20000 UNIT/ML IJ SOLN
INTRAMUSCULAR | Status: AC
  Filled 2015-11-20: qty 1

## 2015-11-20 MED ORDER — EPOETIN ALFA 10000 UNIT/ML IJ SOLN
INTRAMUSCULAR | Status: AC
  Filled 2015-11-20: qty 1

## 2015-11-21 LAB — POCT HEMOGLOBIN-HEMACUE: Hemoglobin: 12 g/dL (ref 12.0–15.0)

## 2015-12-04 ENCOUNTER — Encounter (HOSPITAL_COMMUNITY)
Admission: RE | Admit: 2015-12-04 | Discharge: 2015-12-04 | Disposition: A | Discharge status: Home / Self Care | Payer: Medicare HMO | Source: Ambulatory Visit | Attending: Nephrology | Admitting: Nephrology

## 2015-12-04 DIAGNOSIS — N184 Chronic kidney disease, stage 4 (severe): Secondary | ICD-10-CM | POA: Insufficient documentation

## 2015-12-04 DIAGNOSIS — D631 Anemia in chronic kidney disease: Secondary | ICD-10-CM | POA: Insufficient documentation

## 2015-12-04 LAB — IRON AND TIBC
IRON: 103 ug/dL (ref 28–170)
SATURATION RATIOS: 50 % — AB (ref 10.4–31.8)
TIBC: 204 ug/dL — AB (ref 250–450)
UIBC: 101 ug/dL

## 2015-12-04 LAB — FERRITIN: FERRITIN: 1131 ng/mL — AB (ref 11–307)

## 2015-12-04 LAB — POCT HEMOGLOBIN-HEMACUE: Hemoglobin: 11.5 g/dL — ABNORMAL LOW (ref 12.0–15.0)

## 2015-12-04 MED ORDER — EPOETIN ALFA 20000 UNIT/ML IJ SOLN
INTRAMUSCULAR | Status: AC
  Administered 2015-12-04: 20000 [IU] via SUBCUTANEOUS
  Filled 2015-12-04: qty 1

## 2015-12-04 MED ORDER — EPOETIN ALFA 10000 UNIT/ML IJ SOLN
INTRAMUSCULAR | Status: AC
  Administered 2015-12-04: 10000 [IU] via SUBCUTANEOUS
  Filled 2015-12-04: qty 1

## 2015-12-04 MED ORDER — EPOETIN ALFA 10000 UNIT/ML IJ SOLN: 30000.0000 [IU] | INTRAMUSCULAR | Status: DC

## 2015-12-18 ENCOUNTER — Encounter (HOSPITAL_COMMUNITY)
Admission: RE | Admit: 2015-12-18 | Discharge: 2015-12-18 | Disposition: A | Discharge status: Home / Self Care | Payer: Medicare HMO | Source: Ambulatory Visit | Attending: Nephrology | Admitting: Nephrology

## 2015-12-18 DIAGNOSIS — D631 Anemia in chronic kidney disease: Secondary | ICD-10-CM | POA: Diagnosis not present

## 2015-12-18 LAB — POCT HEMOGLOBIN-HEMACUE: Hemoglobin: 11.6 g/dL — ABNORMAL LOW (ref 12.0–15.0)

## 2015-12-18 MED ORDER — EPOETIN ALFA 10000 UNIT/ML IJ SOLN
INTRAMUSCULAR | Status: AC
  Administered 2015-12-18: 10000 [IU] via SUBCUTANEOUS
  Filled 2015-12-18: qty 1

## 2015-12-18 MED ORDER — EPOETIN ALFA 10000 UNIT/ML IJ SOLN: 30000.0000 [IU] | INTRAMUSCULAR | Status: DC

## 2015-12-18 MED ORDER — EPOETIN ALFA 20000 UNIT/ML IJ SOLN
INTRAMUSCULAR | Status: AC
  Administered 2015-12-18: 20000 [IU] via SUBCUTANEOUS
  Filled 2015-12-18: qty 1

## 2015-12-23 DIAGNOSIS — E162 Hypoglycemia, unspecified: Secondary | ICD-10-CM | POA: Diagnosis not present

## 2015-12-23 DIAGNOSIS — E161 Other hypoglycemia: Secondary | ICD-10-CM | POA: Diagnosis not present

## 2015-12-28 DIAGNOSIS — R7309 Other abnormal glucose: Secondary | ICD-10-CM | POA: Diagnosis not present

## 2015-12-28 DIAGNOSIS — E161 Other hypoglycemia: Secondary | ICD-10-CM | POA: Diagnosis not present

## 2015-12-29 DIAGNOSIS — E782 Mixed hyperlipidemia: Secondary | ICD-10-CM | POA: Diagnosis not present

## 2015-12-29 DIAGNOSIS — I12 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 stage renal disease: Secondary | ICD-10-CM | POA: Diagnosis not present

## 2015-12-29 DIAGNOSIS — N2581 Secondary hyperparathyroidism of renal origin: Secondary | ICD-10-CM | POA: Diagnosis not present

## 2015-12-29 DIAGNOSIS — N2889 Other specified disorders of kidney and ureter: Secondary | ICD-10-CM | POA: Diagnosis not present

## 2015-12-29 DIAGNOSIS — M109 Gout, unspecified: Secondary | ICD-10-CM | POA: Diagnosis not present

## 2015-12-29 DIAGNOSIS — E0842 Diabetes mellitus due to underlying condition with diabetic polyneuropathy: Secondary | ICD-10-CM | POA: Diagnosis not present

## 2015-12-29 DIAGNOSIS — E118 Type 2 diabetes mellitus with unspecified complications: Secondary | ICD-10-CM | POA: Diagnosis not present

## 2015-12-29 DIAGNOSIS — D631 Anemia in chronic kidney disease: Secondary | ICD-10-CM | POA: Diagnosis not present

## 2015-12-29 DIAGNOSIS — I770 Arteriovenous fistula, acquired: Secondary | ICD-10-CM | POA: Diagnosis not present

## 2015-12-29 DIAGNOSIS — E1122 Type 2 diabetes mellitus with diabetic chronic kidney disease: Secondary | ICD-10-CM | POA: Diagnosis not present

## 2015-12-29 DIAGNOSIS — N185 Chronic kidney disease, stage 5: Secondary | ICD-10-CM | POA: Diagnosis not present

## 2016-01-01 ENCOUNTER — Encounter (HOSPITAL_COMMUNITY)
Admission: RE | Admit: 2016-01-01 | Discharge: 2016-01-01 | Disposition: A | Discharge status: Home / Self Care | Payer: Medicare HMO | Source: Ambulatory Visit | Attending: Nephrology | Admitting: Nephrology

## 2016-01-01 DIAGNOSIS — D631 Anemia in chronic kidney disease: Secondary | ICD-10-CM | POA: Diagnosis not present

## 2016-01-01 LAB — POCT HEMOGLOBIN-HEMACUE: Hemoglobin: 11.4 g/dL — ABNORMAL LOW (ref 12.0–15.0)

## 2016-01-01 MED ORDER — EPOETIN ALFA 20000 UNIT/ML IJ SOLN
INTRAMUSCULAR | Status: AC
  Administered 2016-01-01: 20000 [IU] via SUBCUTANEOUS
  Filled 2016-01-01: qty 1

## 2016-01-01 MED ORDER — EPOETIN ALFA 10000 UNIT/ML IJ SOLN: 30000.0000 [IU] | INTRAMUSCULAR | Status: DC

## 2016-01-01 MED ORDER — EPOETIN ALFA 10000 UNIT/ML IJ SOLN
INTRAMUSCULAR | Status: AC
  Administered 2016-01-01: 10000 [IU] via SUBCUTANEOUS
  Filled 2016-01-01: qty 1

## 2016-01-09 DIAGNOSIS — E875 Hyperkalemia: Secondary | ICD-10-CM | POA: Diagnosis not present

## 2016-01-15 ENCOUNTER — Encounter (HOSPITAL_COMMUNITY)
Admission: RE | Admit: 2016-01-15 | Discharge: 2016-01-15 | Disposition: A | Discharge status: Home / Self Care | Payer: Medicare HMO | Source: Ambulatory Visit | Attending: Nephrology | Admitting: Nephrology

## 2016-01-15 DIAGNOSIS — D631 Anemia in chronic kidney disease: Secondary | ICD-10-CM | POA: Insufficient documentation

## 2016-01-15 DIAGNOSIS — N184 Chronic kidney disease, stage 4 (severe): Secondary | ICD-10-CM | POA: Diagnosis not present

## 2016-01-15 LAB — POCT HEMOGLOBIN-HEMACUE: Hemoglobin: 11.5 g/dL — ABNORMAL LOW (ref 12.0–15.0)

## 2016-01-15 LAB — IRON AND TIBC
Iron: 58 ug/dL (ref 28–170)
SATURATION RATIOS: 24 % (ref 10.4–31.8)
TIBC: 242 ug/dL — ABNORMAL LOW (ref 250–450)
UIBC: 184 ug/dL

## 2016-01-15 LAB — FERRITIN: Ferritin: 808 ng/mL — ABNORMAL HIGH (ref 11–307)

## 2016-01-15 MED ORDER — EPOETIN ALFA 10000 UNIT/ML IJ SOLN: 30000.0000 [IU] | INTRAMUSCULAR | Status: DC

## 2016-01-15 MED ORDER — EPOETIN ALFA 10000 UNIT/ML IJ SOLN
INTRAMUSCULAR | Status: AC
  Administered 2016-01-15: 10000 [IU] via SUBCUTANEOUS
  Filled 2016-01-15: qty 1

## 2016-01-15 MED ORDER — EPOETIN ALFA 20000 UNIT/ML IJ SOLN
INTRAMUSCULAR | Status: AC
  Administered 2016-01-15: 20000 [IU] via SUBCUTANEOUS
  Filled 2016-01-15: qty 1

## 2016-01-21 ENCOUNTER — Encounter (HOSPITAL_COMMUNITY): Payer: Self-pay | Admitting: Emergency Medicine

## 2016-01-21 ENCOUNTER — Emergency Department (HOSPITAL_COMMUNITY): Payer: Medicare HMO

## 2016-01-21 ENCOUNTER — Emergency Department (HOSPITAL_COMMUNITY)
Admission: EM | Admit: 2016-01-21 | Discharge: 2016-01-21 | Disposition: A | Discharge status: Home / Self Care | Payer: Medicare HMO | Attending: Emergency Medicine | Admitting: Emergency Medicine

## 2016-01-21 DIAGNOSIS — Z8673 Personal history of transient ischemic attack (TIA), and cerebral infarction without residual deficits: Secondary | ICD-10-CM | POA: Insufficient documentation

## 2016-01-21 DIAGNOSIS — I129 Hypertensive chronic kidney disease with stage 1 through stage 4 chronic kidney disease, or unspecified chronic kidney disease: Secondary | ICD-10-CM | POA: Insufficient documentation

## 2016-01-21 DIAGNOSIS — R05 Cough: Secondary | ICD-10-CM | POA: Insufficient documentation

## 2016-01-21 DIAGNOSIS — E1142 Type 2 diabetes mellitus with diabetic polyneuropathy: Secondary | ICD-10-CM | POA: Diagnosis not present

## 2016-01-21 DIAGNOSIS — Z7984 Long term (current) use of oral hypoglycemic drugs: Secondary | ICD-10-CM | POA: Diagnosis not present

## 2016-01-21 DIAGNOSIS — Z87891 Personal history of nicotine dependence: Secondary | ICD-10-CM | POA: Insufficient documentation

## 2016-01-21 DIAGNOSIS — N189 Chronic kidney disease, unspecified: Secondary | ICD-10-CM | POA: Diagnosis not present

## 2016-01-21 DIAGNOSIS — Z7982 Long term (current) use of aspirin: Secondary | ICD-10-CM | POA: Insufficient documentation

## 2016-01-21 DIAGNOSIS — E162 Hypoglycemia, unspecified: Secondary | ICD-10-CM

## 2016-01-21 DIAGNOSIS — E1122 Type 2 diabetes mellitus with diabetic chronic kidney disease: Secondary | ICD-10-CM | POA: Diagnosis not present

## 2016-01-21 DIAGNOSIS — Z79899 Other long term (current) drug therapy: Secondary | ICD-10-CM | POA: Insufficient documentation

## 2016-01-21 DIAGNOSIS — E161 Other hypoglycemia: Secondary | ICD-10-CM | POA: Diagnosis not present

## 2016-01-21 DIAGNOSIS — E11649 Type 2 diabetes mellitus with hypoglycemia without coma: Secondary | ICD-10-CM | POA: Insufficient documentation

## 2016-01-21 LAB — COMPREHENSIVE METABOLIC PANEL
ALBUMIN: 3.2 g/dL — AB (ref 3.5–5.0)
ALT: 11 U/L — ABNORMAL LOW (ref 14–54)
ANION GAP: 13 (ref 5–15)
AST: 16 U/L (ref 15–41)
Alkaline Phosphatase: 71 U/L (ref 38–126)
BUN: 139 mg/dL — AB (ref 6–20)
CHLORIDE: 104 mmol/L (ref 101–111)
CO2: 17 mmol/L — AB (ref 22–32)
Calcium: 8.3 mg/dL — ABNORMAL LOW (ref 8.9–10.3)
Creatinine, Ser: 9.43 mg/dL — ABNORMAL HIGH (ref 0.44–1.00)
GFR calc Af Amer: 4 mL/min — ABNORMAL LOW (ref >60)
GFR calc non Af Amer: 4 mL/min — ABNORMAL LOW (ref >60)
GLUCOSE: 75 mg/dL (ref 65–99)
POTASSIUM: 5.6 mmol/L — AB (ref 3.5–5.1)
SODIUM: 134 mmol/L — AB (ref 135–145)
Total Bilirubin: 0.3 mg/dL (ref 0.3–1.2)
Total Protein: 8.1 g/dL (ref 6.5–8.1)

## 2016-01-21 LAB — URINALYSIS, ROUTINE W REFLEX MICROSCOPIC
Bilirubin Urine: NEGATIVE
GLUCOSE, UA: NEGATIVE mg/dL
Ketones, ur: NEGATIVE mg/dL
Nitrite: NEGATIVE
Protein, ur: 100 mg/dL — AB
SPECIFIC GRAVITY, URINE: 1.011 (ref 1.005–1.030)
pH: 5.5 (ref 5.0–8.0)

## 2016-01-21 LAB — CBC
HEMATOCRIT: 37.4 % (ref 36.0–46.0)
HEMOGLOBIN: 11.5 g/dL — AB (ref 12.0–15.0)
MCH: 30.2 pg (ref 26.0–34.0)
MCHC: 30.7 g/dL (ref 30.0–36.0)
MCV: 98.2 fL (ref 78.0–100.0)
Platelets: 307 10*3/uL (ref 150–400)
RBC: 3.81 MIL/uL — AB (ref 3.87–5.11)
RDW: 16.3 % — ABNORMAL HIGH (ref 11.5–15.5)
WBC: 17.8 10*3/uL — AB (ref 4.0–10.5)

## 2016-01-21 LAB — CBG MONITORING, ED
GLUCOSE-CAPILLARY: 93 mg/dL (ref 65–99)
Glucose-Capillary: 127 mg/dL — ABNORMAL HIGH (ref 65–99)
Glucose-Capillary: 80 mg/dL (ref 65–99)
Glucose-Capillary: 82 mg/dL (ref 65–99)

## 2016-01-21 LAB — TROPONIN I

## 2016-01-21 LAB — URINE MICROSCOPIC-ADD ON

## 2016-01-21 MED ORDER — GLIPIZIDE 5 MG PO TABS: 5.0000 mg | ORAL_TABLET | Freq: Every day | ORAL | Status: DC

## 2016-01-21 NOTE — ED Notes (Signed)
Dr. Gilford Raid MD at bedside.

## 2016-01-21 NOTE — ED Notes (Signed)
Pt arrives from home via [facility]. EMS pt nonresponsive upon arrival. EMS reports CBG 42, pt had vomited and had been positioned on side by son.  EMS reports giving 1 amp D50, pt became responsive with strong cough.  CBG on arrival 127. Pt AOx4.

## 2016-01-21 NOTE — ED Provider Notes (Addendum)
CSN: [id number]     Arrival date & time 01/21/16  0855 History   First MD Initiated Contact with Patient 01/21/16 0901     Chief Complaint  Patient presents with  . Hypoglycemia  . Cough   Pt is a 70 yo bf who is a diabetic and who takes glipizide and lantus.  The pt said that she was not feeling well last night and was up a few times during the night.  The pt said that she took a shower around 0300.  The pt did get up another time since then, but was unsure of the time.  Pt's son checks on her every day and when he checked on her today, she was unresponsive.  Pt said that she could hear people talking, but could not do anything about it.  The EMS said her bs was 42.  They gave her 1 amp D50 and BS is now 127.  The pt's son said pt did have emesis next to her when he found her.  He positioned her on her side and she coughed to clear her airway.  Pt said that she feels fine now.  (Consider location/radiation/quality/duration/timing/severity/associated sxs/prior Treatment) The history is provided by the patient, a relative and the EMS personnel.    Past Medical History  Diagnosis Date  . Chronic kidney disease   . Stroke ([place])   . Peripheral vascular disease ([place])   . History of gout     takes Allopurinol daily  . Hypertension     takes Monopril daily  . Diabetes mellitus     takes Glipizide daily  . Hyperlipidemia     takes Pravastatin daily  . Pneumonia     hx of-yrs ago  . Dizziness     occasionally  . Peripheral neuropathy ([place])   . Arthritis   . Joint pain   . History of colon polyps    Past Surgical History  Procedure Laterality Date  . Appendectomy    . Tubal ligation    . Rotator cuff surgery Right 2013  . Colonoscopy    . Esophagogastroduodenoscopy    . Cyst removed from lower abdomen  80's  . Bascilic vein transposition Right 10/03/2013    Procedure: BRACHIOCEPHALIC Arteriovenous Fistula ;  Surgeon: Mal Misty, MD;  Location: [facility] OR;  Service: Vascular;   Laterality: Right;   Family History  Problem Relation Age of Onset  . Depression Mother   . Hypertension Mother   . Depression Father   . Depression Sister   . Hypertension Sister    Social History  Substance Use Topics  . Smoking status: Former Smoker -- 10 years    Types: Cigarettes  . Smokeless tobacco: Never Used     Comment: quit smoking over 30+yrs ago  . Alcohol Use: No   OB History    No data available     Review of Systems  Respiratory: Positive for cough.   Endocrine:       Low blood sugar  All other systems reviewed and are negative.     Allergies  Tylenol  Home Medications   Prior to Admission medications   Medication Sig Start Date End Date Taking? Authorizing Provider  allopurinol (ZYLOPRIM) 100 MG tablet Take 100 mg by mouth daily.  05/25/13   Historical Provider, MD  aspirin EC 81 MG tablet Take 81 mg by mouth daily.    Historical Provider, MD  calcitRIOL (ROCALTROL) 0.5 MCG capsule Take 0.5 mcg by mouth daily.  Historical Provider, MD  calcium acetate (PHOSLO) 667 MG capsule Take 667 mg by mouth 3 (three) times daily with meals.  06/22/13   Historical Provider, MD  cinacalcet (SENSIPAR) 30 MG tablet Take 30 mg by mouth daily.    Historical Provider, MD  fosinopril (MONOPRIL) 20 MG tablet Take 30 mg by mouth daily. Take 1 1/2 qd    Historical Provider, MD  furosemide (LASIX) 80 MG tablet Take 80 mg by mouth 2 (two) times daily.    Historical Provider, MD  gabapentin (NEURONTIN) 100 MG capsule Take 1 capsule by mouth every morning and 1 capsule at bedtime. PATIENT NEEDS OFFICE VISIT FOR ADDITIONAL REFILLS 05/25/13   Collene Leyden, PA-C  glipiZIDE (GLUCOTROL) 5 MG tablet Take 1 tablet (5 mg total) by mouth daily. 01/21/16   Isla Pence, MD  glucose blood test strip Use to test blood sugar daily. Dx code: 250.02. 11/06/12   Theda Sers, PA-C  Lancets MISC Use to test blood sugar daily. Dx code 38.02. 11/08/12   Collene Leyden, PA-C  methocarbamol  (ROBAXIN) 500 MG tablet Take 500 mg by mouth every 6 (six) hours as needed. Reported on 09/30/2015    Historical Provider, MD  oxyCODONE (ROXICODONE) 5 MG immediate release tablet Take 1 tablet (5 mg total) by mouth every 6 (six) hours as needed for severe pain. Patient not taking: Reported on 09/30/2015 10/03/13   Mal Misty, MD  pravastatin (PRAVACHOL) 20 MG tablet Take 20 mg by mouth daily.    Historical Provider, MD  pregabalin (LYRICA) 50 MG capsule Take 50 mg by mouth daily.    Historical Provider, MD   BP 146/71 mmHg  Pulse 82  Temp(Src) 98 F (36.7 C) (Oral)  Resp 21  Wt 207 lb (93.895 kg)  SpO2 100% Physical Exam  Constitutional: She is oriented to person, place, and time. She appears well-developed and well-nourished.  HENT:  Head: Normocephalic and atraumatic.  Right Ear: External ear normal.  Left Ear: External ear normal.  Nose: Nose normal.  Mouth/Throat: Oropharynx is clear and moist.  Eyes: Conjunctivae and EOM are normal. Pupils are equal, round, and reactive to light.  Neck: Normal range of motion. Neck supple.  Cardiovascular: Normal rate, regular rhythm, normal heart sounds and intact distal pulses.   Pulmonary/Chest: Effort normal and breath sounds normal.  Abdominal: Soft. Bowel sounds are normal.  Musculoskeletal: Normal range of motion.  Neurological: She is alert and oriented to person, place, and time.  Skin: Skin is warm and dry.  Psychiatric: She has a normal mood and affect. Her behavior is normal. Judgment and thought content normal.  Nursing note and vitals reviewed.   ED Course  Procedures (including critical care time) Labs Review Labs Reviewed  CBC - Abnormal; Notable for the following:    WBC 17.8 (*)    RBC 3.81 (*)    Hemoglobin 11.5 (*)    RDW 16.3 (*)    All other components within normal limits  COMPREHENSIVE METABOLIC PANEL - Abnormal; Notable for the following:    Sodium 134 (*)    Potassium 5.6 (*)    CO2 17 (*)    BUN 139 (*)     Creatinine, Ser 9.43 (*)    Calcium 8.3 (*)    Albumin 3.2 (*)    ALT 11 (*)    GFR calc non Af Amer 4 (*)    GFR calc Af Amer 4 (*)    All other components within normal  limits  URINALYSIS, ROUTINE W REFLEX MICROSCOPIC (NOT AT [facility]) - Abnormal; Notable for the following:    APPearance CLOUDY (*)    Hgb urine dipstick SMALL (*)    Protein, ur 100 (*)    Leukocytes, UA SMALL (*)    All other components within normal limits  URINE MICROSCOPIC-ADD ON - Abnormal; Notable for the following:    Squamous Epithelial / LPF 0-5 (*)    Bacteria, UA FEW (*)    All other components within normal limits  CBG MONITORING, ED - Abnormal; Notable for the following:    Glucose-Capillary 127 (*)    All other components within normal limits  TROPONIN I  CBG MONITORING, ED  CBG MONITORING, ED  CBG MONITORING, ED    Imaging Review Dg Chest 2 View  01/21/2016  CLINICAL DATA:  Hypoglycemia.  History of hypertension. EXAM: CHEST  2 VIEW COMPARISON:  Chest radiograph 09/25/2013 FINDINGS: The lungs are well inflated. Cardiomediastinal contours are unchanged. Mild aortic arch atherosclerosis. No pneumothorax or pleural effusion. No focal airspace consolidation or evidence for pulmonary edema. IMPRESSION: 1. No radiographic evidence of pneumonia or pulmonary edema. 2. Aortic atherosclerosis. Electronically Signed   By: Ulyses Jarred M.D.   On: 01/21/2016 10:12   I have personally reviewed and evaluated these images and lab results as part of my medical decision-making.   EKG Interpretation   Date/Time:  Wednesday January 21 2016 08:59:33 EDT Ventricular Rate:  87 PR Interval:    QRS Duration: 81 QT Interval:  387 QTC Calculation: 466 R Axis:   40 Text Interpretation:  Sinus rhythm Low voltage, extremity and precordial  leads Confirmed by Travis Purk MD, Tyde Lamison ([id number]) on 01/21/2016 9:57:53 AM      MDM  Pt is feeling much better.  She said that she's been going to the nutritionist and has been going to  the gym.  Her daughter said that she's had a couple low blood sugar episodes since she's been going to the nutritionist.  I suspect with the positive changes pt is making to her diet and exercise that she does not need as much medication.  I decreased her meds to 5 mg and pt knows to continue following nutritionist meal plans.  Pt knows to return if worse. As far as the cough goes, pt said that she lives in an apartment building and she can smell other people's cigarette smoke and that makes her cough. Due to elevated BUN/Cr, I spoke with Dr. Moshe Cipro (nephrology) who will let Dr. Jimmy Footman know about Cr.  Pt has an appt with him on Friday (2 days) to discuss dialysis then.  She requested that we totally stop glipizide.  Final diagnoses:  CRI (chronic renal insufficiency), unspecified stage  Hypoglycemia        Isla Pence, MD 01/21/16 1237  Isla Pence, MD 01/21/16 1517

## 2016-01-21 NOTE — ED Notes (Signed)
CBG 127 

## 2016-01-21 NOTE — ED Notes (Signed)
Pt given a bag meal and an apple juice.

## 2016-01-21 NOTE — ED Notes (Signed)
  CBG 80

## 2016-01-21 NOTE — ED Notes (Signed)
Pt taken out into wheelchair to help get into daughters car.

## 2016-01-21 NOTE — ED Notes (Signed)
CBG 93 

## 2016-01-21 NOTE — ED Notes (Signed)
Patient transported to X-ray 

## 2016-01-22 LAB — HEPATITIS B SURFACE ANTIGEN: Hepatitis B Surface Ag: NEGATIVE

## 2016-01-26 ENCOUNTER — Other Ambulatory Visit: Payer: Self-pay | Admitting: Family Medicine

## 2016-01-26 DIAGNOSIS — Z1231 Encounter for screening mammogram for malignant neoplasm of breast: Secondary | ICD-10-CM

## 2016-01-28 ENCOUNTER — Other Ambulatory Visit (HOSPITAL_COMMUNITY): Payer: Self-pay | Admitting: *Deleted

## 2016-01-29 ENCOUNTER — Ambulatory Visit (HOSPITAL_COMMUNITY)
Admission: RE | Admit: 2016-01-29 | Discharge: 2016-01-29 | Disposition: A | Discharge status: Home / Self Care | Payer: Medicare HMO | Source: Ambulatory Visit | Attending: Nephrology | Admitting: Nephrology

## 2016-01-29 DIAGNOSIS — D638 Anemia in other chronic diseases classified elsewhere: Secondary | ICD-10-CM | POA: Diagnosis not present

## 2016-01-29 DIAGNOSIS — N184 Chronic kidney disease, stage 4 (severe): Secondary | ICD-10-CM | POA: Diagnosis present

## 2016-01-29 LAB — POCT HEMOGLOBIN-HEMACUE: HEMOGLOBIN: 10.5 g/dL — AB (ref 12.0–15.0)

## 2016-01-29 MED ORDER — EPOETIN ALFA 10000 UNIT/ML IJ SOLN: 30000.0000 [IU] | INTRAMUSCULAR | Status: DC

## 2016-01-29 MED ORDER — EPOETIN ALFA 20000 UNIT/ML IJ SOLN
INTRAMUSCULAR | Status: AC
  Filled 2016-01-29: qty 1

## 2016-01-29 MED ORDER — FERUMOXYTOL INJECTION 510 MG/17 ML
510.0000 mg | Freq: Once | INTRAVENOUS | Status: AC
  Administered 2016-01-29: 510 mg via INTRAVENOUS
  Filled 2016-01-29: qty 17

## 2016-01-29 MED ORDER — EPOETIN ALFA 10000 UNIT/ML IJ SOLN
10000.0000 [IU] | Freq: Once | INTRAMUSCULAR | Status: AC
  Administered 2016-01-29: 10000 [IU] via SUBCUTANEOUS

## 2016-01-29 MED ORDER — EPOETIN ALFA 10000 UNIT/ML IJ SOLN
INTRAMUSCULAR | Status: AC
  Filled 2016-01-29: qty 1

## 2016-01-29 MED ORDER — EPOETIN ALFA 20000 UNIT/ML IJ SOLN
20000.0000 [IU] | INTRAMUSCULAR | Status: DC
  Administered 2016-01-29: 20000 [IU] via SUBCUTANEOUS

## 2016-02-06 ENCOUNTER — Ambulatory Visit: Payer: Medicare Other

## 2016-02-11 ENCOUNTER — Other Ambulatory Visit (HOSPITAL_COMMUNITY): Payer: Self-pay | Admitting: *Deleted

## 2016-02-12 ENCOUNTER — Encounter (HOSPITAL_COMMUNITY)
Admission: RE | Admit: 2016-02-12 | Discharge: 2016-02-12 | Disposition: A | Discharge status: Home / Self Care | Payer: Medicare HMO | Source: Ambulatory Visit | Attending: Nephrology | Admitting: Nephrology

## 2016-02-12 DIAGNOSIS — N184 Chronic kidney disease, stage 4 (severe): Secondary | ICD-10-CM | POA: Insufficient documentation

## 2016-02-12 DIAGNOSIS — D631 Anemia in chronic kidney disease: Secondary | ICD-10-CM | POA: Diagnosis present

## 2016-02-12 LAB — FERRITIN: Ferritin: 1085 ng/mL — ABNORMAL HIGH (ref 11–307)

## 2016-02-12 LAB — IRON AND TIBC
IRON: 79 ug/dL (ref 28–170)
Saturation Ratios: 35 % — ABNORMAL HIGH (ref 10.4–31.8)
TIBC: 225 ug/dL — AB (ref 250–450)
UIBC: 146 ug/dL

## 2016-02-12 LAB — POCT HEMOGLOBIN-HEMACUE: HEMOGLOBIN: 11.3 g/dL — AB (ref 12.0–15.0)

## 2016-02-12 MED ORDER — EPOETIN ALFA 10000 UNIT/ML IJ SOLN
30000.0000 [IU] | INTRAMUSCULAR | Status: DC
  Administered 2016-02-12: 10000 [IU] via SUBCUTANEOUS
  Administered 2016-02-12: 20000 [IU] via SUBCUTANEOUS

## 2016-02-12 MED ORDER — EPOETIN ALFA 10000 UNIT/ML IJ SOLN
INTRAMUSCULAR | Status: AC
  Filled 2016-02-12: qty 1

## 2016-02-12 MED ORDER — EPOETIN ALFA 20000 UNIT/ML IJ SOLN
INTRAMUSCULAR | Status: AC
  Filled 2016-02-12: qty 1

## 2016-02-13 ENCOUNTER — Ambulatory Visit
Admission: RE | Admit: 2016-02-13 | Discharge: 2016-02-13 | Disposition: A | Discharge status: Home / Self Care | Payer: Medicare Other | Source: Ambulatory Visit | Attending: Family Medicine | Admitting: Family Medicine

## 2016-02-13 DIAGNOSIS — Z1231 Encounter for screening mammogram for malignant neoplasm of breast: Secondary | ICD-10-CM

## 2016-02-26 ENCOUNTER — Encounter (HOSPITAL_COMMUNITY)
Admission: RE | Admit: 2016-02-26 | Discharge: 2016-02-26 | Disposition: A | Discharge status: Home / Self Care | Payer: Medicare HMO | Source: Ambulatory Visit | Attending: Nephrology | Admitting: Nephrology

## 2016-02-26 DIAGNOSIS — N184 Chronic kidney disease, stage 4 (severe): Secondary | ICD-10-CM

## 2016-02-26 DIAGNOSIS — D631 Anemia in chronic kidney disease: Secondary | ICD-10-CM | POA: Diagnosis not present

## 2016-02-26 LAB — POCT HEMOGLOBIN-HEMACUE: Hemoglobin: 10.9 g/dL — ABNORMAL LOW (ref 12.0–15.0)

## 2016-02-26 MED ORDER — EPOETIN ALFA 20000 UNIT/ML IJ SOLN
INTRAMUSCULAR | Status: AC
  Administered 2016-02-26: 20000 [IU]
  Filled 2016-02-26: qty 1

## 2016-02-26 MED ORDER — EPOETIN ALFA 10000 UNIT/ML IJ SOLN: 30000.0000 [IU] | INTRAMUSCULAR | Status: DC

## 2016-02-26 MED ORDER — EPOETIN ALFA 10000 UNIT/ML IJ SOLN
INTRAMUSCULAR | Status: AC
  Administered 2016-02-26: 10000 [IU]
  Filled 2016-02-26: qty 1

## 2016-03-11 ENCOUNTER — Encounter (HOSPITAL_COMMUNITY): Payer: Medicare HMO

## 2016-03-12 ENCOUNTER — Encounter (HOSPITAL_COMMUNITY)
Admission: RE | Admit: 2016-03-12 | Discharge: 2016-03-12 | Disposition: A | Discharge status: Home / Self Care | Payer: Medicare HMO | Source: Ambulatory Visit | Attending: Nephrology | Admitting: Nephrology

## 2016-03-12 DIAGNOSIS — D631 Anemia in chronic kidney disease: Secondary | ICD-10-CM | POA: Insufficient documentation

## 2016-03-12 DIAGNOSIS — N184 Chronic kidney disease, stage 4 (severe): Secondary | ICD-10-CM | POA: Insufficient documentation

## 2016-03-12 LAB — POCT HEMOGLOBIN-HEMACUE: Hemoglobin: 11.8 g/dL — ABNORMAL LOW (ref 12.0–15.0)

## 2016-03-12 LAB — IRON AND TIBC
Iron: 66 ug/dL (ref 28–170)
Saturation Ratios: 29 % (ref 10.4–31.8)
TIBC: 230 ug/dL — AB (ref 250–450)
UIBC: 164 ug/dL

## 2016-03-12 LAB — FERRITIN: FERRITIN: 862 ng/mL — AB (ref 11–307)

## 2016-03-12 MED ORDER — EPOETIN ALFA 10000 UNIT/ML IJ SOLN: 30000.0000 [IU] | INTRAMUSCULAR | Status: DC

## 2016-03-12 MED ORDER — EPOETIN ALFA 10000 UNIT/ML IJ SOLN
INTRAMUSCULAR | Status: AC
  Administered 2016-03-12: 10000 [IU] via SUBCUTANEOUS
  Filled 2016-03-12: qty 1

## 2016-03-12 MED ORDER — EPOETIN ALFA 20000 UNIT/ML IJ SOLN
INTRAMUSCULAR | Status: AC
  Administered 2016-03-12: 20000 [IU] via SUBCUTANEOUS
  Filled 2016-03-12: qty 1

## 2016-03-24 ENCOUNTER — Other Ambulatory Visit (HOSPITAL_COMMUNITY): Payer: Self-pay | Admitting: *Deleted

## 2016-03-25 ENCOUNTER — Ambulatory Visit (HOSPITAL_COMMUNITY)
Admission: RE | Admit: 2016-03-25 | Discharge: 2016-03-25 | Disposition: A | Discharge status: Home / Self Care | Payer: Medicare HMO | Source: Ambulatory Visit | Attending: Nephrology | Admitting: Nephrology

## 2016-03-25 DIAGNOSIS — N184 Chronic kidney disease, stage 4 (severe): Secondary | ICD-10-CM | POA: Diagnosis not present

## 2016-03-25 DIAGNOSIS — D638 Anemia in other chronic diseases classified elsewhere: Secondary | ICD-10-CM | POA: Insufficient documentation

## 2016-03-25 LAB — POCT HEMOGLOBIN-HEMACUE: HEMOGLOBIN: 12.5 g/dL (ref 12.0–15.0)

## 2016-03-25 MED ORDER — FERUMOXYTOL INJECTION 510 MG/17 ML
510.0000 mg | Freq: Once | INTRAVENOUS | Status: AC
  Administered 2016-03-25: 510 mg via INTRAVENOUS
  Filled 2016-03-25: qty 17

## 2016-03-25 MED ORDER — EPOETIN ALFA 10000 UNIT/ML IJ SOLN: 30000.0000 [IU] | INTRAMUSCULAR | Status: DC

## 2016-04-07 ENCOUNTER — Other Ambulatory Visit (HOSPITAL_COMMUNITY): Payer: Self-pay | Admitting: *Deleted

## 2016-04-08 ENCOUNTER — Ambulatory Visit (HOSPITAL_COMMUNITY)
Admission: RE | Admit: 2016-04-08 | Discharge: 2016-04-08 | Disposition: A | Discharge status: Home / Self Care | Payer: Medicare HMO | Source: Ambulatory Visit | Attending: Nephrology | Admitting: Nephrology

## 2016-04-08 DIAGNOSIS — N184 Chronic kidney disease, stage 4 (severe): Secondary | ICD-10-CM | POA: Diagnosis not present

## 2016-04-08 LAB — IRON AND TIBC
Iron: 165 ug/dL (ref 28–170)
SATURATION RATIOS: 91 % — AB (ref 10.4–31.8)
TIBC: 182 ug/dL — ABNORMAL LOW (ref 250–450)
UIBC: 17 ug/dL

## 2016-04-08 LAB — FERRITIN: Ferritin: 1177 ng/mL — ABNORMAL HIGH (ref 11–307)

## 2016-04-08 LAB — POCT HEMOGLOBIN-HEMACUE: Hemoglobin: 11.5 g/dL — ABNORMAL LOW (ref 12.0–15.0)

## 2016-04-08 MED ORDER — EPOETIN ALFA 10000 UNIT/ML IJ SOLN: 30000.0000 [IU] | INTRAMUSCULAR | Status: DC

## 2016-04-08 MED ORDER — EPOETIN ALFA 20000 UNIT/ML IJ SOLN
INTRAMUSCULAR | Status: AC
  Administered 2016-04-08: 20000 [IU]
  Filled 2016-04-08: qty 1

## 2016-04-08 MED ORDER — EPOETIN ALFA 10000 UNIT/ML IJ SOLN
INTRAMUSCULAR | Status: AC
  Administered 2016-04-08: 10000 [IU]
  Filled 2016-04-08: qty 1

## 2016-04-22 ENCOUNTER — Encounter (HOSPITAL_COMMUNITY)
Admission: RE | Admit: 2016-04-22 | Discharge: 2016-04-22 | Disposition: A | Discharge status: Home / Self Care | Payer: Medicare HMO | Source: Ambulatory Visit | Attending: Nephrology | Admitting: Nephrology

## 2016-04-22 DIAGNOSIS — N184 Chronic kidney disease, stage 4 (severe): Secondary | ICD-10-CM | POA: Insufficient documentation

## 2016-04-22 DIAGNOSIS — D631 Anemia in chronic kidney disease: Secondary | ICD-10-CM | POA: Diagnosis present

## 2016-04-22 LAB — POCT HEMOGLOBIN-HEMACUE: Hemoglobin: 11.8 g/dL — ABNORMAL LOW (ref 12.0–15.0)

## 2016-04-22 MED ORDER — EPOETIN ALFA 20000 UNIT/ML IJ SOLN
INTRAMUSCULAR | Status: AC
Start: 2016-04-22 — End: 2016-04-22
  Administered 2016-04-22: 20000 [IU] via SUBCUTANEOUS
  Filled 2016-04-22: qty 1

## 2016-04-22 MED ORDER — EPOETIN ALFA 10000 UNIT/ML IJ SOLN
INTRAMUSCULAR | Status: AC
Start: 2016-04-22 — End: 2016-04-22
  Filled 2016-04-22: qty 1

## 2016-04-22 MED ORDER — EPOETIN ALFA 10000 UNIT/ML IJ SOLN
30000.0000 [IU] | INTRAMUSCULAR | Status: DC
  Administered 2016-04-22: 10000 [IU] via SUBCUTANEOUS

## 2016-05-06 ENCOUNTER — Encounter (HOSPITAL_COMMUNITY)
Admission: RE | Admit: 2016-05-06 | Discharge: 2016-05-06 | Disposition: A | Discharge status: Home / Self Care | Payer: Medicare HMO | Source: Ambulatory Visit | Attending: Nephrology | Admitting: Nephrology

## 2016-05-06 DIAGNOSIS — N184 Chronic kidney disease, stage 4 (severe): Secondary | ICD-10-CM | POA: Diagnosis not present

## 2016-05-06 DIAGNOSIS — D631 Anemia in chronic kidney disease: Secondary | ICD-10-CM | POA: Insufficient documentation

## 2016-05-06 LAB — IRON AND TIBC
IRON: 62 ug/dL (ref 28–170)
SATURATION RATIOS: 27 % (ref 10.4–31.8)
TIBC: 234 ug/dL — AB (ref 250–450)
UIBC: 172 ug/dL

## 2016-05-06 LAB — FERRITIN: FERRITIN: 1099 ng/mL — AB (ref 11–307)

## 2016-05-06 MED ORDER — EPOETIN ALFA 10000 UNIT/ML IJ SOLN: 30000.0000 [IU] | INTRAMUSCULAR | Status: DC

## 2016-05-06 MED ORDER — EPOETIN ALFA 20000 UNIT/ML IJ SOLN
INTRAMUSCULAR | Status: AC
  Administered 2016-05-06: 20000 [IU]
  Filled 2016-05-06: qty 1

## 2016-05-06 MED ORDER — EPOETIN ALFA 10000 UNIT/ML IJ SOLN
INTRAMUSCULAR | Status: AC
  Administered 2016-05-06: 10000 [IU]
  Filled 2016-05-06: qty 1

## 2016-05-07 LAB — POCT HEMOGLOBIN-HEMACUE: Hemoglobin: 11.6 g/dL — ABNORMAL LOW (ref 12.0–15.0)

## 2016-05-19 ENCOUNTER — Other Ambulatory Visit (HOSPITAL_COMMUNITY): Payer: Self-pay | Admitting: *Deleted

## 2016-05-20 ENCOUNTER — Encounter (HOSPITAL_COMMUNITY)
Admission: RE | Admit: 2016-05-20 | Discharge: 2016-05-20 | Disposition: A | Discharge status: Home / Self Care | Payer: Medicare HMO | Source: Ambulatory Visit | Attending: Nephrology | Admitting: Nephrology

## 2016-05-20 DIAGNOSIS — D631 Anemia in chronic kidney disease: Secondary | ICD-10-CM | POA: Diagnosis not present

## 2016-05-20 DIAGNOSIS — N184 Chronic kidney disease, stage 4 (severe): Secondary | ICD-10-CM

## 2016-05-20 LAB — POCT HEMOGLOBIN-HEMACUE: HEMOGLOBIN: 11.6 g/dL — AB (ref 12.0–15.0)

## 2016-05-20 MED ORDER — EPOETIN ALFA 10000 UNIT/ML IJ SOLN
INTRAMUSCULAR | Status: AC
Start: 2016-05-20 — End: 2016-05-20
  Administered 2016-05-20: 10000 [IU] via SUBCUTANEOUS
  Filled 2016-05-20: qty 1

## 2016-05-20 MED ORDER — EPOETIN ALFA 10000 UNIT/ML IJ SOLN: 30000.0000 [IU] | INTRAMUSCULAR | Status: DC

## 2016-05-20 MED ORDER — EPOETIN ALFA 20000 UNIT/ML IJ SOLN
INTRAMUSCULAR | Status: AC
Start: 2016-05-20 — End: 2016-05-20
  Administered 2016-05-20: 20000 [IU] via SUBCUTANEOUS
  Filled 2016-05-20: qty 1

## 2016-06-03 ENCOUNTER — Encounter (HOSPITAL_COMMUNITY)
Admission: RE | Admit: 2016-06-03 | Discharge: 2016-06-03 | Disposition: A | Discharge status: Home / Self Care | Payer: Medicare HMO | Source: Ambulatory Visit | Attending: Nephrology | Admitting: Nephrology

## 2016-06-03 DIAGNOSIS — N184 Chronic kidney disease, stage 4 (severe): Secondary | ICD-10-CM

## 2016-06-03 DIAGNOSIS — D631 Anemia in chronic kidney disease: Secondary | ICD-10-CM | POA: Diagnosis not present

## 2016-06-03 LAB — POCT HEMOGLOBIN-HEMACUE: HEMOGLOBIN: 12.1 g/dL (ref 12.0–15.0)

## 2016-06-03 MED ORDER — EPOETIN ALFA 10000 UNIT/ML IJ SOLN: 30000.0000 [IU] | INTRAMUSCULAR | Status: DC

## 2016-06-15 ENCOUNTER — Other Ambulatory Visit: Payer: Self-pay

## 2016-06-15 DIAGNOSIS — N63 Unspecified lump in unspecified breast: Secondary | ICD-10-CM

## 2016-06-17 ENCOUNTER — Encounter (HOSPITAL_COMMUNITY)
Admission: RE | Admit: 2016-06-17 | Discharge: 2016-06-17 | Disposition: A | Discharge status: Home / Self Care | Payer: Medicare HMO | Source: Ambulatory Visit | Attending: Nephrology | Admitting: Nephrology

## 2016-06-17 DIAGNOSIS — N184 Chronic kidney disease, stage 4 (severe): Secondary | ICD-10-CM | POA: Diagnosis not present

## 2016-06-17 DIAGNOSIS — D631 Anemia in chronic kidney disease: Secondary | ICD-10-CM | POA: Diagnosis not present

## 2016-06-17 LAB — FERRITIN: FERRITIN: 1192 ng/mL — AB (ref 11–307)

## 2016-06-17 LAB — IRON AND TIBC
Iron: 132 ug/dL (ref 28–170)
Saturation Ratios: 71 % — ABNORMAL HIGH (ref 10.4–31.8)
TIBC: 186 ug/dL — ABNORMAL LOW (ref 250–450)
UIBC: 54 ug/dL

## 2016-06-17 LAB — POCT HEMOGLOBIN-HEMACUE: Hemoglobin: 12.5 g/dL (ref 12.0–15.0)

## 2016-06-17 MED ORDER — EPOETIN ALFA 10000 UNIT/ML IJ SOLN: 30000.0000 [IU] | INTRAMUSCULAR | Status: DC

## 2016-06-19 ENCOUNTER — Inpatient Hospital Stay (HOSPITAL_COMMUNITY)
Admission: AD | Admit: 2016-06-19 | Discharge: 2016-06-19 | Disposition: A | Discharge status: Home / Self Care | Payer: Medicare HMO | Source: Ambulatory Visit | Attending: Obstetrics & Gynecology | Admitting: Obstetrics & Gynecology

## 2016-06-19 DIAGNOSIS — Z8673 Personal history of transient ischemic attack (TIA), and cerebral infarction without residual deficits: Secondary | ICD-10-CM | POA: Insufficient documentation

## 2016-06-19 DIAGNOSIS — Z87891 Personal history of nicotine dependence: Secondary | ICD-10-CM | POA: Diagnosis not present

## 2016-06-19 DIAGNOSIS — N189 Chronic kidney disease, unspecified: Secondary | ICD-10-CM | POA: Diagnosis not present

## 2016-06-19 DIAGNOSIS — N644 Mastodynia: Secondary | ICD-10-CM | POA: Insufficient documentation

## 2016-06-19 DIAGNOSIS — E1122 Type 2 diabetes mellitus with diabetic chronic kidney disease: Secondary | ICD-10-CM | POA: Insufficient documentation

## 2016-06-19 DIAGNOSIS — E785 Hyperlipidemia, unspecified: Secondary | ICD-10-CM | POA: Insufficient documentation

## 2016-06-19 DIAGNOSIS — Z86010 Personal history of colonic polyps: Secondary | ICD-10-CM | POA: Diagnosis not present

## 2016-06-19 DIAGNOSIS — I129 Hypertensive chronic kidney disease with stage 1 through stage 4 chronic kidney disease, or unspecified chronic kidney disease: Secondary | ICD-10-CM | POA: Insufficient documentation

## 2016-06-19 DIAGNOSIS — M109 Gout, unspecified: Secondary | ICD-10-CM | POA: Insufficient documentation

## 2016-06-19 DIAGNOSIS — G629 Polyneuropathy, unspecified: Secondary | ICD-10-CM | POA: Insufficient documentation

## 2016-06-19 DIAGNOSIS — L988 Other specified disorders of the skin and subcutaneous tissue: Secondary | ICD-10-CM | POA: Diagnosis not present

## 2016-06-19 DIAGNOSIS — N6320 Unspecified lump in the left breast, unspecified quadrant: Secondary | ICD-10-CM | POA: Insufficient documentation

## 2016-06-19 DIAGNOSIS — E1151 Type 2 diabetes mellitus with diabetic peripheral angiopathy without gangrene: Secondary | ICD-10-CM | POA: Diagnosis not present

## 2016-06-19 NOTE — MAU Provider Note (Signed)
Chief Complaint: Breast Pain   First Provider Initiated Contact with Patient 06/19/16 1310      SUBJECTIVE HPI: Joanne Carlson is a 70 y.o. who presents to maternity admissions reporting she has a lump in her left breast behind her nipple that is painful, starting 3 days ago. She reports she has had these before that come and go but this one is larger and more painful than usual.  She cannot take Tylenol or ibuprofen because of her health conditions so has not taken anything for pain. [facility] but was told that her mammogram was less than a year ago so she would need an order for more imaging. She came to MAU for evaluation. She denies fever/chills or n/v.   She denies vaginal bleeding, vaginal itching/burning, urinary symptoms, h/a, or dizziness.   HPI  Past Medical History:  Diagnosis Date  . Arthritis   . Chronic kidney disease   . Diabetes mellitus    takes Glipizide daily  . Dizziness    occasionally  . History of colon polyps   . History of gout    takes Allopurinol daily  . Hyperlipidemia    takes Pravastatin daily  . Hypertension    takes Monopril daily  . Joint pain   . Peripheral neuropathy ([place])   . Peripheral vascular disease ([place])   . Pneumonia    hx of-yrs ago  . Stroke [facility])    Past Surgical History:  Procedure Laterality Date  . APPENDECTOMY    . BASCILIC VEIN TRANSPOSITION Right 10/03/2013   Procedure: BRACHIOCEPHALIC Arteriovenous Fistula ;  Surgeon: Mal Misty, MD;  Location: [facility];  Service: Vascular;  Laterality: Right;  . COLONOSCOPY    . cyst removed from lower abdomen  80's  . ESOPHAGOGASTRODUODENOSCOPY    . rotator cuff surgery Right 2013  . TUBAL LIGATION     Social History   Social History  . Marital status: Widowed    Spouse name: N/A  . Number of children: N/A  . Years of education: N/A   Occupational History  . Not on file.   Social History Main Topics  . Smoking status: Former Smoker    Years: 10.00     Types: Cigarettes  . Smokeless tobacco: Never Used     Comment: quit smoking over 30+yrs ago  . Alcohol use No  . Drug use: No  . Sexual activity: No   Other Topics Concern  . Not on file   Social History Narrative  . No narrative on file   No current facility-administered medications on file prior to encounter.    Current Outpatient Prescriptions on File Prior to Encounter  Medication Sig Dispense Refill  . allopurinol (ZYLOPRIM) 100 MG tablet Take 100 mg by mouth daily.     Marland Kitchen aspirin EC 81 MG tablet Take 81 mg by mouth daily.    . calcitRIOL (ROCALTROL) 0.5 MCG capsule Take 0.5 mcg by mouth daily.    . calcium acetate (PHOSLO) 667 MG capsule Take 667 mg by mouth 3 (three) times daily with meals.     . cinacalcet (SENSIPAR) 30 MG tablet Take 30 mg by mouth daily.    . fosinopril (MONOPRIL) 20 MG tablet Take 30 mg by mouth daily. Take 1 1/2 qd    . furosemide (LASIX) 80 MG tablet Take 80 mg by mouth 2 (two) times daily.    Marland Kitchen gabapentin (NEURONTIN) 100 MG capsule Take 1 capsule by mouth every morning and  1 capsule at bedtime. PATIENT NEEDS OFFICE VISIT FOR ADDITIONAL REFILLS 60 capsule 0  . glucose blood test strip Use to test blood sugar daily. Dx code: 250.02. 100 each 3  . Lancets MISC Use to test blood sugar daily. Dx code 250.02. 100 each 3  . methocarbamol (ROBAXIN) 500 MG tablet Take 500 mg by mouth every 6 (six) hours as needed. Reported on 09/30/2015    . oxyCODONE (ROXICODONE) 5 MG immediate release tablet Take 1 tablet (5 mg total) by mouth every 6 (six) hours as needed for severe pain. (Patient not taking: Reported on 09/30/2015) 30 tablet 0  . pravastatin (PRAVACHOL) 20 MG tablet Take 20 mg by mouth daily.    . pregabalin (LYRICA) 50 MG capsule Take 50 mg by mouth daily.     Allergies  Allergen Reactions  . Tylenol [Acetaminophen] Rash    ROS:  Review of Systems  Constitutional: Negative for chills, fatigue and fever.  Respiratory: Negative for shortness of breath.    Cardiovascular: Negative for chest pain.  Genitourinary: Negative for difficulty urinating, dysuria, flank pain, pelvic pain, vaginal bleeding, vaginal discharge and vaginal pain.  Skin:       Pain in left breast and lump behind left nipple  Neurological: Negative for dizziness and headaches.  Psychiatric/Behavioral: Negative.      I have reviewed patient's Past Medical Hx, Surgical Hx, Family Hx, Social Hx, medications and allergies.   Physical Exam  Patient Vitals for the past 24 hrs:  BP Temp Temp src Pulse Resp  06/19/16 1249 136/67 97.9 F (36.6 C) Oral 85 18   Constitutional: Well-developed, well-nourished female in no acute distress.  Cardiovascular: normal rate Respiratory: normal effort Breast: Mild erythema surrounding nipple of left breast, palpable smooth, soft, round mass ~3cm x 3 cm behind and slightly to right of nipple that is painful to palpation GI: Abd soft, non-tender. Pos BS x 4 MS: Extremities nontender, no edema, normal ROM Neurologic: Alert and oriented x 4.  GU: Neg CVAT.   LAB RESULTS No results found for this or any previous visit (from the past 24 hour(s)).     IMAGING No results found.  MAU Management/MDM: Breast exam reveals likely a large cyst behind nipple of left breast.  Order for diagnositic mammogram at [facility].  May need I&D.  BC to call pt, pt given contact information. Pt stable at time of discharge.  ASSESSMENT 1. Breast mass, left   2. Breast pain in female     PLAN Discharge home Allergies as of 06/19/2016      Reactions   Tylenol [acetaminophen] Rash      Medication List    TAKE these medications   allopurinol 100 MG tablet Commonly known as:  ZYLOPRIM Take 100 mg by mouth daily.   aspirin EC 81 MG tablet Take 81 mg by mouth daily.   calcitRIOL 0.5 MCG capsule Commonly known as:  ROCALTROL Take 0.5 mcg by mouth daily.   calcium acetate 667 MG capsule Commonly known as:  PHOSLO Take 667 mg by mouth 3  (three) times daily with meals.   cinacalcet 30 MG tablet Commonly known as:  SENSIPAR Take 30 mg by mouth daily.   fosinopril 20 MG tablet Commonly known as:  MONOPRIL Take 30 mg by mouth daily. Take 1 1/2 qd   furosemide 80 MG tablet Commonly known as:  LASIX Take 80 mg by mouth 2 (two) times daily.   gabapentin 100 MG capsule Commonly known as:  NEURONTIN Take 1 capsule by mouth every morning and 1 capsule at bedtime. PATIENT NEEDS OFFICE VISIT FOR ADDITIONAL REFILLS   glucose blood test strip Use to test blood sugar daily. Dx code: 63.02.   Lancets Misc Use to test blood sugar daily. Dx code 250.02.   methocarbamol 500 MG tablet Commonly known as:  ROBAXIN Take 500 mg by mouth every 6 (six) hours as needed. Reported on 09/30/2015   oxyCODONE 5 MG immediate release tablet Commonly known as:  ROXICODONE Take 1 tablet (5 mg total) by mouth every 6 (six) hours as needed for severe pain.   pravastatin 20 MG tablet Commonly known as:  PRAVACHOL Take 20 mg by mouth daily.   pregabalin 50 MG capsule Commonly known as:  LYRICA Take 50 mg by mouth daily.      Follow-up Information    [facility] Follow up.   Specialty:  Diagnostic Radiology Why:  The [facility] will call you or call the number listed. Contact information: [place] [address] [phone]           Fatima Blank Certified Nurse-Midwife 06/19/2016  1:23 PM

## 2016-06-19 NOTE — Discharge Instructions (Signed)
Fibrocystic Breast Changes Fibrocystic breast changes are changes in breast tissue that can cause breasts to become swollen, lumpy, or painful. This can happen due to buildup of scar-like tissue (fibrous tissue) or the forming of fluid-filled lumps (cysts) in the breast. This is a common condition, and it is not cancerous (is benign). The exact cause is not known, but it seems to occur when women go through hormonal changes during their menstrual cycle. Fibrocystic breast changes can affect one or both breasts. What are the causes? The exact cause of fibrocystic breast changes is not known. However, this condition:  May be related to the female hormones estrogen and progesterone.  May be influenced by family traits that get passed from parent to child (genetics).  What are the signs or symptoms? Symptoms of this condition may affect one or both breasts, and may include:  Tenderness, mild discomfort, or pain.  Swelling.  Rope-like tissue that can be felt when touching the breast.  Lumps in one or both breasts.  Changes in breast size. Breasts may get larger before the menstrual period and smaller after the menstrual period.  Green or dark brown discharge from the nipple.  Symptoms are usually worse before menstrual periods start, and they get better toward the end of menstrual periods. How is this diagnosed? This condition is diagnosed based on your medical history and a physical exam of your breasts. You may also have tests, such as:  A breast X-ray (mammogram).  Ultrasound of your breasts.  MRI.  Removal of a breast tissue sample for testing (breast biopsy). This may be done if your health care provider thinks that something else may be causing changes in your breasts.  How is this treated? Often, treatment is not needed for this condition. In some cases, treatment may include:  Taking over-the-counter pain relievers to help lessen pain or discomfort.  Limiting or avoiding  caffeine. Foods and beverages that contain caffeine include chocolate, soda, coffee, and tea.  Reducing sugar and fat in your diet.  Your health care provider may also recommend:  A procedure to remove fluid from a cyst that is causing pain (fine needle aspiration).  Surgery to remove a cyst that is large or tender or does not go away.  Follow these instructions at home:  Examine your breasts after every menstrual period. If you do not have menstrual periods, check your breasts on the first day of every month. Feel for changes in your breasts, such as: ? More tenderness. ? A new growth. ? A change in size. ? A change in an existing lump.  Take over-the-counter and prescription medicines only as told by your health care provider.  Wear a well-fitted support or sports bra, especially when exercising.  Decrease or avoid caffeine, fat, and sugar in your diet as directed by your health care provider. Contact a health care provider if:  You have fluid leaking from your nipple, especially if it is bloody.  You have new lumps or bumps in your breast.  Your breast becomes enlarged, red, and painful.  You have areas of your breast that pucker inward.  Your nipple appears flat or indented. Get help right away if:  You have redness of your breast and the redness is spreading. Summary  Fibrocystic breast changes are changes in breast tissue that can cause breasts to become swollen, lumpy, or painful.  This condition may be related to the female hormones estrogen and progesterone.  With this condition, it is important to examine   your breasts after every menstrual period. If you do not have menstrual periods, check your breasts on the first day of every month. This information is not intended to replace advice given to you by your health care provider. Make sure you discuss any questions you have with your health care provider. Document Released: 04/07/2006 Document Revised: 03/02/2016  Document Reviewed: 02/18/2016 Elsevier Interactive Patient Education  2017 [employer]   

## 2016-06-19 NOTE — MAU Note (Signed)
About 3 wks ago, noted a quarter sized lump below nipples.  Sometimes hurts.  Last mammogram was in ?May.  Has had this before, never seen on her mammagrams. No drainage or bleeding from nipple.

## 2016-06-21 ENCOUNTER — Other Ambulatory Visit (HOSPITAL_COMMUNITY): Payer: Self-pay | Admitting: Advanced Practice Midwife

## 2016-06-22 ENCOUNTER — Other Ambulatory Visit: Payer: Self-pay | Admitting: Advanced Practice Midwife

## 2016-06-22 DIAGNOSIS — N6320 Unspecified lump in the left breast, unspecified quadrant: Secondary | ICD-10-CM

## 2016-06-22 DIAGNOSIS — N644 Mastodynia: Secondary | ICD-10-CM

## 2016-06-29 ENCOUNTER — Ambulatory Visit
Admission: RE | Admit: 2016-06-29 | Discharge: 2016-06-29 | Disposition: A | Discharge status: Home / Self Care | Payer: Medicare HMO | Source: Ambulatory Visit | Attending: Advanced Practice Midwife | Admitting: Advanced Practice Midwife

## 2016-06-29 ENCOUNTER — Other Ambulatory Visit: Payer: Self-pay | Admitting: Advanced Practice Midwife

## 2016-06-29 DIAGNOSIS — N6320 Unspecified lump in the left breast, unspecified quadrant: Secondary | ICD-10-CM

## 2016-06-29 DIAGNOSIS — N644 Mastodynia: Secondary | ICD-10-CM

## 2016-07-01 ENCOUNTER — Encounter (HOSPITAL_COMMUNITY)
Admission: RE | Admit: 2016-07-01 | Discharge: 2016-07-01 | Disposition: A | Discharge status: Home / Self Care | Payer: Medicare HMO | Source: Ambulatory Visit | Attending: Nephrology | Admitting: Nephrology

## 2016-07-01 DIAGNOSIS — N184 Chronic kidney disease, stage 4 (severe): Secondary | ICD-10-CM

## 2016-07-01 DIAGNOSIS — D631 Anemia in chronic kidney disease: Secondary | ICD-10-CM | POA: Diagnosis not present

## 2016-07-01 LAB — POCT HEMOGLOBIN-HEMACUE: HEMOGLOBIN: 11.5 g/dL — AB (ref 12.0–15.0)

## 2016-07-01 MED ORDER — EPOETIN ALFA 10000 UNIT/ML IJ SOLN
INTRAMUSCULAR | Status: AC
  Administered 2016-07-01: 10000 [IU] via SUBCUTANEOUS
  Filled 2016-07-01: qty 1

## 2016-07-01 MED ORDER — EPOETIN ALFA 10000 UNIT/ML IJ SOLN: 30000.0000 [IU] | INTRAMUSCULAR | Status: DC

## 2016-07-01 MED ORDER — EPOETIN ALFA 20000 UNIT/ML IJ SOLN
INTRAMUSCULAR | Status: AC
  Administered 2016-07-01: 20000 [IU] via SUBCUTANEOUS
  Filled 2016-07-01: qty 1

## 2016-07-02 ENCOUNTER — Ambulatory Visit: Admission: RE | Admit: 2016-07-02 | Payer: Medicare HMO | Source: Ambulatory Visit

## 2016-07-02 ENCOUNTER — Other Ambulatory Visit: Payer: Self-pay | Admitting: Advanced Practice Midwife

## 2016-07-02 ENCOUNTER — Ambulatory Visit
Admission: RE | Admit: 2016-07-02 | Discharge: 2016-07-02 | Disposition: A | Discharge status: Home / Self Care | Payer: Medicare HMO | Source: Ambulatory Visit | Attending: Advanced Practice Midwife | Admitting: Advanced Practice Midwife

## 2016-07-02 DIAGNOSIS — N644 Mastodynia: Secondary | ICD-10-CM

## 2016-07-02 DIAGNOSIS — N6320 Unspecified lump in the left breast, unspecified quadrant: Secondary | ICD-10-CM

## 2016-07-06 ENCOUNTER — Ambulatory Visit
Admission: RE | Admit: 2016-07-06 | Discharge: 2016-07-06 | Disposition: A | Discharge status: Home / Self Care | Payer: Medicare HMO | Source: Ambulatory Visit | Attending: Advanced Practice Midwife | Admitting: Advanced Practice Midwife

## 2016-07-06 ENCOUNTER — Other Ambulatory Visit: Payer: Self-pay | Admitting: Advanced Practice Midwife

## 2016-07-06 DIAGNOSIS — N6320 Unspecified lump in the left breast, unspecified quadrant: Secondary | ICD-10-CM

## 2016-07-06 DIAGNOSIS — N611 Abscess of the breast and nipple: Secondary | ICD-10-CM

## 2016-07-06 DIAGNOSIS — N644 Mastodynia: Secondary | ICD-10-CM

## 2016-07-07 LAB — AEROBIC/ANAEROBIC CULTURE W GRAM STAIN (SURGICAL/DEEP WOUND)

## 2016-07-07 LAB — AEROBIC/ANAEROBIC CULTURE (SURGICAL/DEEP WOUND): SPECIAL REQUESTS: NORMAL

## 2016-07-09 ENCOUNTER — Ambulatory Visit
Admission: RE | Admit: 2016-07-09 | Discharge: 2016-07-09 | Disposition: A | Discharge status: Home / Self Care | Payer: Medicare HMO | Source: Ambulatory Visit | Attending: Advanced Practice Midwife | Admitting: Advanced Practice Midwife

## 2016-07-09 ENCOUNTER — Other Ambulatory Visit: Payer: Self-pay | Admitting: Advanced Practice Midwife

## 2016-07-09 DIAGNOSIS — N6320 Unspecified lump in the left breast, unspecified quadrant: Secondary | ICD-10-CM

## 2016-07-09 DIAGNOSIS — N611 Abscess of the breast and nipple: Secondary | ICD-10-CM

## 2016-07-12 ENCOUNTER — Ambulatory Visit
Admission: RE | Admit: 2016-07-12 | Discharge: 2016-07-12 | Disposition: A | Discharge status: Home / Self Care | Payer: Medicare HMO | Source: Ambulatory Visit | Attending: Advanced Practice Midwife | Admitting: Advanced Practice Midwife

## 2016-07-12 DIAGNOSIS — N611 Abscess of the breast and nipple: Secondary | ICD-10-CM

## 2016-07-12 DIAGNOSIS — N6320 Unspecified lump in the left breast, unspecified quadrant: Secondary | ICD-10-CM

## 2016-07-15 ENCOUNTER — Encounter (HOSPITAL_COMMUNITY)
Admission: RE | Admit: 2016-07-15 | Discharge: 2016-07-15 | Disposition: A | Discharge status: Home / Self Care | Payer: Medicare HMO | Source: Ambulatory Visit | Attending: Nephrology | Admitting: Nephrology

## 2016-07-15 DIAGNOSIS — D631 Anemia in chronic kidney disease: Secondary | ICD-10-CM | POA: Diagnosis present

## 2016-07-15 DIAGNOSIS — N184 Chronic kidney disease, stage 4 (severe): Secondary | ICD-10-CM | POA: Insufficient documentation

## 2016-07-15 LAB — IRON AND TIBC
IRON: 47 ug/dL (ref 28–170)
SATURATION RATIOS: 26 % (ref 10.4–31.8)
TIBC: 181 ug/dL — AB (ref 250–450)
UIBC: 134 ug/dL

## 2016-07-15 LAB — POCT HEMOGLOBIN-HEMACUE: Hemoglobin: 9.8 g/dL — ABNORMAL LOW (ref 12.0–15.0)

## 2016-07-15 LAB — FERRITIN: Ferritin: 1107 ng/mL — ABNORMAL HIGH (ref 11–307)

## 2016-07-15 MED ORDER — EPOETIN ALFA 10000 UNIT/ML IJ SOLN
INTRAMUSCULAR | Status: AC
  Administered 2016-07-15: 10000 [IU] via SUBCUTANEOUS
  Filled 2016-07-15: qty 1

## 2016-07-15 MED ORDER — EPOETIN ALFA 20000 UNIT/ML IJ SOLN
INTRAMUSCULAR | Status: AC
  Administered 2016-07-15: 09:00:00 20000 [IU] via SUBCUTANEOUS
  Filled 2016-07-15: qty 1

## 2016-07-15 MED ORDER — EPOETIN ALFA 10000 UNIT/ML IJ SOLN: 30000.0000 [IU] | INTRAMUSCULAR | Status: DC

## 2016-07-28 ENCOUNTER — Other Ambulatory Visit (HOSPITAL_COMMUNITY): Payer: Self-pay | Admitting: *Deleted

## 2016-07-29 ENCOUNTER — Encounter (HOSPITAL_COMMUNITY)
Admission: RE | Admit: 2016-07-29 | Discharge: 2016-07-29 | Disposition: A | Discharge status: Home / Self Care | Payer: Medicare HMO | Source: Ambulatory Visit | Attending: Nephrology | Admitting: Nephrology

## 2016-07-29 DIAGNOSIS — N184 Chronic kidney disease, stage 4 (severe): Secondary | ICD-10-CM

## 2016-07-29 DIAGNOSIS — D509 Iron deficiency anemia, unspecified: Secondary | ICD-10-CM | POA: Insufficient documentation

## 2016-07-29 LAB — POCT HEMOGLOBIN-HEMACUE: HEMOGLOBIN: 10.9 g/dL — AB (ref 12.0–15.0)

## 2016-07-29 MED ORDER — SODIUM CHLORIDE 0.9 % IV SOLN
510.0000 mg | Freq: Once | INTRAVENOUS | Status: AC
  Administered 2016-07-29: 510 mg via INTRAVENOUS
  Filled 2016-07-29: qty 17

## 2016-07-29 MED ORDER — EPOETIN ALFA 10000 UNIT/ML IJ SOLN: 30000.0000 [IU] | INTRAMUSCULAR | Status: DC

## 2016-07-29 MED ORDER — EPOETIN ALFA 20000 UNIT/ML IJ SOLN
INTRAMUSCULAR | Status: AC
  Administered 2016-07-29: 09:00:00 20000 [IU]
  Filled 2016-07-29: qty 1

## 2016-07-29 MED ORDER — EPOETIN ALFA 10000 UNIT/ML IJ SOLN
INTRAMUSCULAR | Status: AC
  Administered 2016-07-29: 10000 [IU]
  Filled 2016-07-29: qty 1

## 2016-08-11 ENCOUNTER — Other Ambulatory Visit (HOSPITAL_COMMUNITY): Payer: Self-pay | Admitting: *Deleted

## 2016-08-12 ENCOUNTER — Encounter (HOSPITAL_COMMUNITY)
Admission: RE | Admit: 2016-08-12 | Discharge: 2016-08-12 | Disposition: A | Discharge status: Home / Self Care | Payer: Medicare HMO | Source: Ambulatory Visit | Attending: Nephrology | Admitting: Nephrology

## 2016-08-12 DIAGNOSIS — D631 Anemia in chronic kidney disease: Secondary | ICD-10-CM | POA: Diagnosis present

## 2016-08-12 DIAGNOSIS — N184 Chronic kidney disease, stage 4 (severe): Secondary | ICD-10-CM | POA: Insufficient documentation

## 2016-08-12 LAB — IRON AND TIBC
Iron: 88 ug/dL (ref 28–170)
SATURATION RATIOS: 39 % — AB (ref 10.4–31.8)
TIBC: 228 ug/dL — AB (ref 250–450)
UIBC: 140 ug/dL

## 2016-08-12 LAB — FERRITIN: FERRITIN: 1002 ng/mL — AB (ref 11–307)

## 2016-08-12 LAB — POCT HEMOGLOBIN-HEMACUE: Hemoglobin: 11.3 g/dL — ABNORMAL LOW (ref 12.0–15.0)

## 2016-08-12 MED ORDER — EPOETIN ALFA 10000 UNIT/ML IJ SOLN: 30000.0000 [IU] | INTRAMUSCULAR | Status: DC

## 2016-08-12 MED ORDER — EPOETIN ALFA 20000 UNIT/ML IJ SOLN
INTRAMUSCULAR | Status: AC
  Administered 2016-08-12: 20000 [IU] via SUBCUTANEOUS
  Filled 2016-08-12: qty 1

## 2016-08-12 MED ORDER — EPOETIN ALFA 10000 UNIT/ML IJ SOLN
INTRAMUSCULAR | Status: AC
  Administered 2016-08-12: 10000 [IU] via SUBCUTANEOUS
  Filled 2016-08-12: qty 1

## 2016-08-26 ENCOUNTER — Encounter (HOSPITAL_COMMUNITY)
Admission: RE | Admit: 2016-08-26 | Discharge: 2016-08-26 | Disposition: A | Discharge status: Home / Self Care | Payer: Medicare HMO | Source: Ambulatory Visit | Attending: Nephrology | Admitting: Nephrology

## 2016-08-26 DIAGNOSIS — N184 Chronic kidney disease, stage 4 (severe): Secondary | ICD-10-CM

## 2016-08-26 DIAGNOSIS — D631 Anemia in chronic kidney disease: Secondary | ICD-10-CM | POA: Diagnosis not present

## 2016-08-26 LAB — POCT HEMOGLOBIN-HEMACUE: Hemoglobin: 11.2 g/dL — ABNORMAL LOW (ref 12.0–15.0)

## 2016-08-26 MED ORDER — EPOETIN ALFA 10000 UNIT/ML IJ SOLN
INTRAMUSCULAR | Status: AC
  Administered 2016-08-26: 09:00:00 10000 [IU] via SUBCUTANEOUS
  Filled 2016-08-26: qty 1

## 2016-08-26 MED ORDER — EPOETIN ALFA 20000 UNIT/ML IJ SOLN
INTRAMUSCULAR | Status: AC
  Administered 2016-08-26: 09:00:00 20000 [IU] via SUBCUTANEOUS
  Filled 2016-08-26: qty 1

## 2016-08-26 MED ORDER — EPOETIN ALFA 10000 UNIT/ML IJ SOLN: 30000.0000 [IU] | INTRAMUSCULAR | Status: DC

## 2016-09-09 ENCOUNTER — Encounter (HOSPITAL_COMMUNITY)
Admission: RE | Admit: 2016-09-09 | Discharge: 2016-09-09 | Disposition: A | Discharge status: Home / Self Care | Payer: Medicare HMO | Source: Ambulatory Visit | Attending: Nephrology | Admitting: Nephrology

## 2016-09-09 DIAGNOSIS — D631 Anemia in chronic kidney disease: Secondary | ICD-10-CM | POA: Diagnosis present

## 2016-09-09 DIAGNOSIS — N184 Chronic kidney disease, stage 4 (severe): Secondary | ICD-10-CM

## 2016-09-09 LAB — IRON AND TIBC
IRON: 60 ug/dL (ref 28–170)
Saturation Ratios: 29 % (ref 10.4–31.8)
TIBC: 207 ug/dL — ABNORMAL LOW (ref 250–450)
UIBC: 147 ug/dL

## 2016-09-09 LAB — POCT HEMOGLOBIN-HEMACUE: Hemoglobin: 11.7 g/dL — ABNORMAL LOW (ref 12.0–15.0)

## 2016-09-09 LAB — FERRITIN: Ferritin: 985 ng/mL — ABNORMAL HIGH (ref 11–307)

## 2016-09-09 MED ORDER — EPOETIN ALFA 10000 UNIT/ML IJ SOLN: 30000.0000 [IU] | INTRAMUSCULAR | Status: DC

## 2016-09-09 MED ORDER — EPOETIN ALFA 20000 UNIT/ML IJ SOLN
INTRAMUSCULAR | Status: AC
  Administered 2016-09-09: 09:00:00 20000 [IU] via SUBCUTANEOUS
  Filled 2016-09-09: qty 1

## 2016-09-09 MED ORDER — EPOETIN ALFA 10000 UNIT/ML IJ SOLN
INTRAMUSCULAR | Status: AC
  Administered 2016-09-09: 10000 [IU] via SUBCUTANEOUS
  Filled 2016-09-09: qty 1

## 2016-09-23 ENCOUNTER — Encounter (HOSPITAL_COMMUNITY)
Admission: RE | Admit: 2016-09-23 | Discharge: 2016-09-23 | Disposition: A | Discharge status: Home / Self Care | Payer: Medicare HMO | Source: Ambulatory Visit | Attending: Nephrology | Admitting: Nephrology

## 2016-09-23 DIAGNOSIS — D631 Anemia in chronic kidney disease: Secondary | ICD-10-CM | POA: Diagnosis not present

## 2016-09-23 DIAGNOSIS — N184 Chronic kidney disease, stage 4 (severe): Secondary | ICD-10-CM

## 2016-09-23 LAB — POCT HEMOGLOBIN-HEMACUE: HEMOGLOBIN: 11.8 g/dL — AB (ref 12.0–15.0)

## 2016-09-23 MED ORDER — EPOETIN ALFA 10000 UNIT/ML IJ SOLN: 30000.0000 [IU] | INTRAMUSCULAR | Status: DC

## 2016-09-23 MED ORDER — EPOETIN ALFA 20000 UNIT/ML IJ SOLN
INTRAMUSCULAR | Status: AC
  Administered 2016-09-23: 10:00:00 20000 [IU] via SUBCUTANEOUS
  Filled 2016-09-23: qty 1

## 2016-09-23 MED ORDER — EPOETIN ALFA 10000 UNIT/ML IJ SOLN
INTRAMUSCULAR | Status: AC
  Administered 2016-09-23: 10:00:00 10000 [IU] via SUBCUTANEOUS
  Filled 2016-09-23: qty 1

## 2016-10-07 ENCOUNTER — Encounter (HOSPITAL_COMMUNITY)
Admission: RE | Admit: 2016-10-07 | Discharge: 2016-10-07 | Disposition: A | Discharge status: Home / Self Care | Payer: Medicare HMO | Source: Ambulatory Visit | Attending: Nephrology | Admitting: Nephrology

## 2016-10-07 DIAGNOSIS — N184 Chronic kidney disease, stage 4 (severe): Secondary | ICD-10-CM | POA: Diagnosis not present

## 2016-10-07 DIAGNOSIS — D631 Anemia in chronic kidney disease: Secondary | ICD-10-CM | POA: Diagnosis not present

## 2016-10-07 LAB — POCT HEMOGLOBIN-HEMACUE: Hemoglobin: 12 g/dL (ref 12.0–15.0)

## 2016-10-07 LAB — IRON AND TIBC
IRON: 64 ug/dL (ref 28–170)
Saturation Ratios: 29 % (ref 10.4–31.8)
TIBC: 217 ug/dL — ABNORMAL LOW (ref 250–450)
UIBC: 153 ug/dL

## 2016-10-07 LAB — FERRITIN: FERRITIN: 952 ng/mL — AB (ref 11–307)

## 2016-10-07 MED ORDER — EPOETIN ALFA 10000 UNIT/ML IJ SOLN: 30000.0000 [IU] | INTRAMUSCULAR | Status: DC

## 2016-10-12 DIAGNOSIS — I12 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 stage renal disease: Secondary | ICD-10-CM | POA: Insufficient documentation

## 2016-10-12 DIAGNOSIS — M549 Dorsalgia, unspecified: Secondary | ICD-10-CM | POA: Insufficient documentation

## 2016-10-12 DIAGNOSIS — E875 Hyperkalemia: Secondary | ICD-10-CM | POA: Insufficient documentation

## 2016-10-12 DIAGNOSIS — M109 Gout, unspecified: Secondary | ICD-10-CM | POA: Insufficient documentation

## 2016-10-12 DIAGNOSIS — N2581 Secondary hyperparathyroidism of renal origin: Secondary | ICD-10-CM | POA: Insufficient documentation

## 2016-10-19 DIAGNOSIS — D689 Coagulation defect, unspecified: Secondary | ICD-10-CM | POA: Insufficient documentation

## 2016-10-21 ENCOUNTER — Encounter (HOSPITAL_COMMUNITY): Payer: Medicare HMO

## 2016-10-26 ENCOUNTER — Other Ambulatory Visit: Payer: Self-pay | Admitting: *Deleted

## 2016-10-26 DIAGNOSIS — N186 End stage renal disease: Secondary | ICD-10-CM

## 2016-10-26 DIAGNOSIS — Z01810 Encounter for preprocedural cardiovascular examination: Secondary | ICD-10-CM

## 2016-10-28 ENCOUNTER — Ambulatory Visit (INDEPENDENT_AMBULATORY_CARE_PROVIDER_SITE_OTHER)
Admission: RE | Admit: 2016-10-28 | Discharge: 2016-10-28 | Disposition: A | Discharge status: Home / Self Care | Payer: Medicare HMO | Source: Ambulatory Visit | Attending: Surgery | Admitting: Surgery

## 2016-10-28 ENCOUNTER — Ambulatory Visit (HOSPITAL_COMMUNITY)
Admission: RE | Admit: 2016-10-28 | Discharge: 2016-10-28 | Disposition: A | Discharge status: Home / Self Care | Payer: Medicare HMO | Source: Ambulatory Visit | Attending: Surgery | Admitting: Surgery

## 2016-10-28 DIAGNOSIS — N186 End stage renal disease: Secondary | ICD-10-CM | POA: Insufficient documentation

## 2016-10-28 DIAGNOSIS — Z01810 Encounter for preprocedural cardiovascular examination: Secondary | ICD-10-CM

## 2016-10-29 ENCOUNTER — Other Ambulatory Visit: Payer: Self-pay

## 2016-10-29 ENCOUNTER — Encounter: Payer: Self-pay | Admitting: Vascular Surgery

## 2016-10-29 ENCOUNTER — Ambulatory Visit (INDEPENDENT_AMBULATORY_CARE_PROVIDER_SITE_OTHER): Payer: Medicare HMO | Admitting: Vascular Surgery

## 2016-10-29 VITALS — BP 133/83 | HR 86 | Temp 97.3°F | Resp 18 | Ht 65.0 in | Wt 182.1 lb

## 2016-10-29 DIAGNOSIS — N186 End stage renal disease: Secondary | ICD-10-CM | POA: Diagnosis not present

## 2016-10-29 NOTE — Progress Notes (Signed)
Established Dialysis Access   History of Present Illness   Joanne Carlson is a 71 y.o. (1946/04/14) female who presents for re-evaluation for permanent access.  The patient is right hand dominant.  Previous access procedures have been completed in both arms.  The patient's complication from previous access procedures include: thrombosis.  The patient has never had a previous PPM placed.  Past Medical History:  Diagnosis Date  . Arthritis   . Chronic kidney disease   . Diabetes mellitus    takes Glipizide daily  . Dizziness    occasionally  . History of colon polyps   . History of gout    takes Allopurinol daily  . Hyperlipidemia    takes Pravastatin daily  . Hypertension    takes Monopril daily  . Joint pain   . Peripheral neuropathy   . Peripheral vascular disease ([place])   . Pneumonia    hx of-yrs ago  . Stroke [facility])     Past Surgical History:  Procedure Laterality Date  . APPENDECTOMY    . BASCILIC VEIN TRANSPOSITION Right 10/03/2013   Procedure: BRACHIOCEPHALIC Arteriovenous Fistula ;  Surgeon: Mal Misty, MD;  Location: [place];  Service: Vascular;  Laterality: Right;  . COLONOSCOPY    . cyst removed from lower abdomen  80's  . ESOPHAGOGASTRODUODENOSCOPY    . rotator cuff surgery Right 2013  . TUBAL LIGATION      Social History   Social History  . Marital status: Widowed    Spouse name: N/A  . Number of children: N/A  . Years of education: N/A   Occupational History  . Not on file.   Social History Main Topics  . Smoking status: Former Smoker    Years: 10.00    Types: Cigarettes  . Smokeless tobacco: Never Used     Comment: quit smoking over 30+yrs ago  . Alcohol use No  . Drug use: No  . Sexual activity: No   Other Topics Concern  . Not on file   Social History Narrative  . No narrative on file    Family History  Problem Relation Age of Onset  . Depression Mother   . Hypertension Mother   . Depression Father   . Depression Sister     . Hypertension Sister     Current Outpatient Prescriptions  Medication Sig Dispense Refill  . allopurinol (ZYLOPRIM) 100 MG tablet Take 100 mg by mouth daily.     Marland Kitchen aspirin EC 81 MG tablet Take 81 mg by mouth daily.    . calcitRIOL (ROCALTROL) 0.5 MCG capsule Take 0.5 mcg by mouth daily.    . calcium acetate (PHOSLO) 667 MG capsule Take 667 mg by mouth 3 (three) times daily with meals.     . cinacalcet (SENSIPAR) 30 MG tablet Take 30 mg by mouth daily.    . fosinopril (MONOPRIL) 20 MG tablet Take 30 mg by mouth daily. Take 1 1/2 qd    . furosemide (LASIX) 80 MG tablet Take 80 mg by mouth 2 (two) times daily.    Marland Kitchen gabapentin (NEURONTIN) 100 MG capsule Take 1 capsule by mouth every morning and 1 capsule at bedtime. PATIENT NEEDS OFFICE VISIT FOR ADDITIONAL REFILLS 60 capsule 0  . glucose blood test strip Use to test blood sugar daily. Dx code: 250.02. 100 each 3  . Lancets MISC Use to test blood sugar daily. Dx code 250.02. 100 each 3  . pravastatin (PRAVACHOL) 20 MG tablet Take 20  mg by mouth daily.    . methocarbamol (ROBAXIN) 500 MG tablet Take 500 mg by mouth every 6 (six) hours as needed. Reported on 09/30/2015    . oxyCODONE (ROXICODONE) 5 MG immediate release tablet Take 1 tablet (5 mg total) by mouth every 6 (six) hours as needed for severe pain. (Patient not taking: Reported on 09/30/2015) 30 tablet 0  . pregabalin (LYRICA) 50 MG capsule Take 50 mg by mouth daily.     No current facility-administered medications for this visit.      Allergies  Allergen Reactions  . Tylenol [Acetaminophen] Rash     REVIEW OF SYSTEMS:  (Positives checked otherwise negative)  CARDIOVASCULAR:   [ ]  chest pain,  [ ]  chest pressure,  [ ]  palpitations,  [ ]  shortness of breath when laying flat,  [ ]  shortness of breath with exertion,   [ ]  pain in feet when walking,  [ ]  pain in feet when laying flat, [ ]  history of blood clot in veins (DVT),  [ ]  history of phlebitis,  [ ]  swelling in legs,   [ ]  varicose veins  PULMONARY:   [ ]  productive cough,  [ ]  asthma,  [ ]  wheezing  NEUROLOGIC:   [ ]  weakness in arms or legs,  [ ]  numbness in arms or legs,  [ ]  difficulty speaking or slurred speech,  [ ]  temporary loss of vision in one eye,  [ ]  dizziness  HEMATOLOGIC:   [ ]  bleeding problems,  [ ]  problems with blood clotting too easily  MUSCULOSKEL:   [ ]  joint pain, [ ]  joint swelling  GASTROINTEST:   [ ]  vomiting blood,  [ ]  blood in stool     GENITOURINARY:   [ ]  burning with urination,  [ ]  blood in urine [x]  end stage renal disease-HD: T/R/S   PSYCHIATRIC:   [ ]  history of major depression  INTEGUMENTARY:   [ ]  rashes,  [ ]  ulcers  CONSTITUTIONAL:   [ ]  fever,  [ ]  chills   Physical Examination   Vitals:   10/29/16 1419  BP: 133/83  Pulse: 86  Resp: 18  Temp: 97.3 F (36.3 C)  TempSrc: Oral  SpO2: 96%  Weight: 182 lb 1.6 oz (82.6 kg)  Height: 5\' 5"  (1.651 m)    Body mass index is 30.3 kg/m.  General Alert, O x 3, WD, NAD  Pulmonary Sym exp, good B air movt, CTA B, RIJV TDC exiting R chest  Cardiac RRR, Nl S1, S2, no Murmurs, No rubs, No S3,S4  Vascular Vessel Right Left  Radial Palpable Palpable  Ulnar Palpable Palpable  Brachial Palpable Palpable    Gastrointestinal soft, non-distended, non-tender to palpation, No guarding or rebound, no HSM, no masses, no CVAT B, No palpable prominent aortic pulse,    Musculoskeletal M/S 5/5 throughout  , Extremities without ischemic changes  , No edema present, no palpable thrill, no bruit, thrombosed R BC AVF with palpable cord, prior healed incision in L wrist and antecubitum  Neurologic Pain and light touch intact in extremities  , Motor exam as listed above    Non-Invasive Vascular Imaging   BUE Doppler (10/29/2016):   R arm:   Brachial: tri, 5.9 mm  Radial: tri, 1.6 mm  Ulnar: tri, 1.6 mm  L arm:   Brachial: tri, 4.5 mm  Radial: tri, 1.0 mm  Ulnar: tri, 2.1 mm  B Vein  Mapping  (10/29/2016 ):   R arm: acceptable vein  conduits include basilic vein  L arm: acceptable vein conduits include basilic vein   Outside Studies/Documentation   10 pages of outside documents were reviewed including: outpatient nephrology chart.   Medical Decision Making   GHALIA REICKS is a 71 y.o. female who presents with ESRD requiring hemodialysis.    Given repeated access failures, I recommend: B arm and central venogram.  Depending on the findings, will proceed with placement of next access, likely a single stage basilic vein transposition.  This is completed via IV placed in EACH arm. Risk of extremity and central venography include but are not limited to: anaphylaxis, bleeding, infection, and incomplete visualization of the venous system. The patient is aware of these risks and has elected to proceed.  She is scheduled for 7 MAY 18   Adele Barthel, MD, [facility] [place] Office: [phone] Pager: [phone]

## 2016-11-08 ENCOUNTER — Other Ambulatory Visit: Payer: Self-pay

## 2016-11-08 ENCOUNTER — Encounter (HOSPITAL_COMMUNITY)
Admission: RE | Disposition: A | Discharge status: Home / Self Care | Payer: Self-pay | Source: Ambulatory Visit | Attending: Vascular Surgery

## 2016-11-08 ENCOUNTER — Ambulatory Visit (HOSPITAL_COMMUNITY)
Admission: RE | Admit: 2016-11-08 | Discharge: 2016-11-08 | Disposition: A | Discharge status: Home / Self Care | Payer: Medicare HMO | Source: Ambulatory Visit | Attending: Vascular Surgery | Admitting: Vascular Surgery

## 2016-11-08 DIAGNOSIS — I12 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 stage renal disease: Secondary | ICD-10-CM | POA: Diagnosis not present

## 2016-11-08 DIAGNOSIS — Z8673 Personal history of transient ischemic attack (TIA), and cerebral infarction without residual deficits: Secondary | ICD-10-CM | POA: Insufficient documentation

## 2016-11-08 DIAGNOSIS — N186 End stage renal disease: Secondary | ICD-10-CM | POA: Diagnosis not present

## 2016-11-08 DIAGNOSIS — E1142 Type 2 diabetes mellitus with diabetic polyneuropathy: Secondary | ICD-10-CM | POA: Insufficient documentation

## 2016-11-08 DIAGNOSIS — N185 Chronic kidney disease, stage 5: Secondary | ICD-10-CM | POA: Diagnosis not present

## 2016-11-08 DIAGNOSIS — Z7982 Long term (current) use of aspirin: Secondary | ICD-10-CM | POA: Diagnosis not present

## 2016-11-08 DIAGNOSIS — E785 Hyperlipidemia, unspecified: Secondary | ICD-10-CM | POA: Insufficient documentation

## 2016-11-08 DIAGNOSIS — Z87891 Personal history of nicotine dependence: Secondary | ICD-10-CM | POA: Insufficient documentation

## 2016-11-08 DIAGNOSIS — M109 Gout, unspecified: Secondary | ICD-10-CM | POA: Diagnosis not present

## 2016-11-08 DIAGNOSIS — E1122 Type 2 diabetes mellitus with diabetic chronic kidney disease: Secondary | ICD-10-CM | POA: Diagnosis not present

## 2016-11-08 DIAGNOSIS — Z992 Dependence on renal dialysis: Secondary | ICD-10-CM | POA: Diagnosis not present

## 2016-11-08 DIAGNOSIS — E1151 Type 2 diabetes mellitus with diabetic peripheral angiopathy without gangrene: Secondary | ICD-10-CM | POA: Insufficient documentation

## 2016-11-08 DIAGNOSIS — M1990 Unspecified osteoarthritis, unspecified site: Secondary | ICD-10-CM | POA: Diagnosis not present

## 2016-11-08 HISTORY — PX: UPPER EXTREMITY VENOGRAPHY: CATH118272

## 2016-11-08 LAB — POCT I-STAT, CHEM 8
BUN: 36 mg/dL — ABNORMAL HIGH (ref 6–20)
CREATININE: 5.5 mg/dL — AB (ref 0.44–1.00)
Calcium, Ion: 1.26 mmol/L (ref 1.15–1.40)
Chloride: 96 mmol/L — ABNORMAL LOW (ref 101–111)
Glucose, Bld: 106 mg/dL — ABNORMAL HIGH (ref 65–99)
HEMATOCRIT: 40 % (ref 36.0–46.0)
HEMOGLOBIN: 13.6 g/dL (ref 12.0–15.0)
Potassium: 3.8 mmol/L (ref 3.5–5.1)
SODIUM: 139 mmol/L (ref 135–145)
TCO2: 31 mmol/L (ref 0–100)

## 2016-11-08 LAB — GLUCOSE, CAPILLARY: GLUCOSE-CAPILLARY: 68 mg/dL (ref 65–99)

## 2016-11-08 SURGERY — UPPER EXTREMITY VENOGRAPHY
Anesthesia: LOCAL | Laterality: Bilateral

## 2016-11-08 MED ORDER — IODIXANOL 320 MG/ML IV SOLN
INTRAVENOUS | Status: DC | PRN
  Administered 2016-11-08: 50 mL via INTRAVENOUS

## 2016-11-08 MED ORDER — SODIUM CHLORIDE 0.9% FLUSH: 3.0000 mL | INTRAVENOUS | Status: DC | PRN

## 2016-11-08 SURGICAL SUPPLY — 2 items
TRANSDUCER W/STOPCOCK (MISCELLANEOUS) ×3 IMPLANT
TUBING CIL FLEX 10 FLL-RA (TUBING) ×2 IMPLANT

## 2016-11-08 NOTE — H&P (View-Only) (Signed)
Established Dialysis Access   History of Present Illness   Joanne Carlson is a 71 y.o. (12/30/1945) female who presents for re-evaluation for permanent access.  The patient is right hand dominant.  Previous access procedures have been completed in both arms.  The patient's complication from previous access procedures include: thrombosis.  The patient has never had a previous PPM placed.  Past Medical History:  Diagnosis Date  . Arthritis   . Chronic kidney disease   . Diabetes mellitus    takes Glipizide daily  . Dizziness    occasionally  . History of colon polyps   . History of gout    takes Allopurinol daily  . Hyperlipidemia    takes Pravastatin daily  . Hypertension    takes Monopril daily  . Joint pain   . Peripheral neuropathy   . Peripheral vascular disease ([place])   . Pneumonia    hx of-yrs ago  . Stroke [facility])     Past Surgical History:  Procedure Laterality Date  . APPENDECTOMY    . BASCILIC VEIN TRANSPOSITION Right 10/03/2013   Procedure: BRACHIOCEPHALIC Arteriovenous Fistula ;  Surgeon: Mal Misty, MD;  Location: [place];  Service: Vascular;  Laterality: Right;  . COLONOSCOPY    . cyst removed from lower abdomen  80's  . ESOPHAGOGASTRODUODENOSCOPY    . rotator cuff surgery Right 2013  . TUBAL LIGATION      Social History   Social History  . Marital status: Widowed    Spouse name: N/A  . Number of children: N/A  . Years of education: N/A   Occupational History  . Not on file.   Social History Main Topics  . Smoking status: Former Smoker    Years: 10.00    Types: Cigarettes  . Smokeless tobacco: Never Used     Comment: quit smoking over 30+yrs ago  . Alcohol use No  . Drug use: No  . Sexual activity: No   Other Topics Concern  . Not on file   Social History Narrative  . No narrative on file    Family History  Problem Relation Age of Onset  . Depression Mother   . Hypertension Mother   . Depression Father   . Depression Sister     . Hypertension Sister     Current Outpatient Prescriptions  Medication Sig Dispense Refill  . allopurinol (ZYLOPRIM) 100 MG tablet Take 100 mg by mouth daily.     Marland Kitchen aspirin EC 81 MG tablet Take 81 mg by mouth daily.    . calcitRIOL (ROCALTROL) 0.5 MCG capsule Take 0.5 mcg by mouth daily.    . calcium acetate (PHOSLO) 667 MG capsule Take 667 mg by mouth 3 (three) times daily with meals.     . cinacalcet (SENSIPAR) 30 MG tablet Take 30 mg by mouth daily.    . fosinopril (MONOPRIL) 20 MG tablet Take 30 mg by mouth daily. Take 1 1/2 qd    . furosemide (LASIX) 80 MG tablet Take 80 mg by mouth 2 (two) times daily.    Marland Kitchen gabapentin (NEURONTIN) 100 MG capsule Take 1 capsule by mouth every morning and 1 capsule at bedtime. PATIENT NEEDS OFFICE VISIT FOR ADDITIONAL REFILLS 60 capsule 0  . glucose blood test strip Use to test blood sugar daily. Dx code: 250.02. 100 each 3  . Lancets MISC Use to test blood sugar daily. Dx code 250.02. 100 each 3  . pravastatin (PRAVACHOL) 20 MG tablet Take 20  mg by mouth daily.    . methocarbamol (ROBAXIN) 500 MG tablet Take 500 mg by mouth every 6 (six) hours as needed. Reported on 09/30/2015    . oxyCODONE (ROXICODONE) 5 MG immediate release tablet Take 1 tablet (5 mg total) by mouth every 6 (six) hours as needed for severe pain. (Patient not taking: Reported on 09/30/2015) 30 tablet 0  . pregabalin (LYRICA) 50 MG capsule Take 50 mg by mouth daily.     No current facility-administered medications for this visit.      Allergies  Allergen Reactions  . Tylenol [Acetaminophen] Rash     REVIEW OF SYSTEMS:  (Positives checked otherwise negative)  CARDIOVASCULAR:   [ ]  chest pain,  [ ]  chest pressure,  [ ]  palpitations,  [ ]  shortness of breath when laying flat,  [ ]  shortness of breath with exertion,   [ ]  pain in feet when walking,  [ ]  pain in feet when laying flat, [ ]  history of blood clot in veins (DVT),  [ ]  history of phlebitis,  [ ]  swelling in legs,   [ ]  varicose veins  PULMONARY:   [ ]  productive cough,  [ ]  asthma,  [ ]  wheezing  NEUROLOGIC:   [ ]  weakness in arms or legs,  [ ]  numbness in arms or legs,  [ ]  difficulty speaking or slurred speech,  [ ]  temporary loss of vision in one eye,  [ ]  dizziness  HEMATOLOGIC:   [ ]  bleeding problems,  [ ]  problems with blood clotting too easily  MUSCULOSKEL:   [ ]  joint pain, [ ]  joint swelling  GASTROINTEST:   [ ]  vomiting blood,  [ ]  blood in stool     GENITOURINARY:   [ ]  burning with urination,  [ ]  blood in urine [x]  end stage renal disease-HD: T/R/S   PSYCHIATRIC:   [ ]  history of major depression  INTEGUMENTARY:   [ ]  rashes,  [ ]  ulcers  CONSTITUTIONAL:   [ ]  fever,  [ ]  chills   Physical Examination   Vitals:   10/29/16 1419  BP: 133/83  Pulse: 86  Resp: 18  Temp: 97.3 F (36.3 C)  TempSrc: Oral  SpO2: 96%  Weight: 182 lb 1.6 oz (82.6 kg)  Height: 5\' 5"  (1.651 m)    Body mass index is 30.3 kg/m.  General Alert, O x 3, WD, NAD  Pulmonary Sym exp, good B air movt, CTA B, RIJV TDC exiting R chest  Cardiac RRR, Nl S1, S2, no Murmurs, No rubs, No S3,S4  Vascular Vessel Right Left  Radial Palpable Palpable  Ulnar Palpable Palpable  Brachial Palpable Palpable    Gastrointestinal soft, non-distended, non-tender to palpation, No guarding or rebound, no HSM, no masses, no CVAT B, No palpable prominent aortic pulse,    Musculoskeletal M/S 5/5 throughout  , Extremities without ischemic changes  , No edema present, no palpable thrill, no bruit, thrombosed R BC AVF with palpable cord, prior healed incision in L wrist and antecubitum  Neurologic Pain and light touch intact in extremities  , Motor exam as listed above    Non-Invasive Vascular Imaging   BUE Doppler (10/29/2016):   R arm:   Brachial: tri, 5.9 mm  Radial: tri, 1.6 mm  Ulnar: tri, 1.6 mm  L arm:   Brachial: tri, 4.5 mm  Radial: tri, 1.0 mm  Ulnar: tri, 2.1 mm  B Vein  Mapping  (10/29/2016 ):   R arm: acceptable vein  conduits include basilic vein  L arm: acceptable vein conduits include basilic vein   Outside Studies/Documentation   10 pages of outside documents were reviewed including: outpatient nephrology chart.   Medical Decision Making   Joanne Carlson is a 72 y.o. female who presents with ESRD requiring hemodialysis.    Given repeated access failures, I recommend: B arm and central venogram.  Depending on the findings, will proceed with placement of next access, likely a single stage basilic vein transposition.  This is completed via IV placed in EACH arm. Risk of extremity and central venography include but are not limited to: anaphylaxis, bleeding, infection, and incomplete visualization of the venous system. The patient is aware of these risks and has elected to proceed.  She is scheduled for 7 MAY 18   Adele Barthel, MD, [facility] [place] Office: [phone] Pager: [phone]

## 2016-11-08 NOTE — Discharge Instructions (Signed)
Venogram, Care After °This sheet gives you information about how to care for yourself after your procedure. Your health care provider may also give you more specific instructions. If you have problems or questions, contact your health care provider. °What can I expect after the procedure? °After the procedure, it is common to have: °· Bruising or mild discomfort in the area where the IV was inserted (insertion site). ° °Follow these instructions at home: °Eating and drinking °· Follow instructions from your health care provider about eating or drinking restrictions. °· Drink a lot of fluids for the first several days after the procedure, as directed by your health care provider. This helps to wash (flush) the contrast out of your body. Examples of healthy fluids include water or low-calorie drinks. °General instructions °· Check your IV insertion area every day for signs of infection. Check for: °? Redness, swelling, or pain. °? Fluid or blood. °? Warmth. °? Pus or a bad smell. °· Take over-the-counter and prescription medicines only as told by your health care provider. °· Rest and return to your normal activities as told by your health care provider. Ask your health care provider what activities are safe for you. °· Do not drive for 24 hours if you were given a medicine to help you relax (sedative), or until your health care provider approves. °· Keep all follow-up visits as told by your health care provider. This is important. °Contact a health care provider if: °· Your skin becomes itchy or you develop a rash or hives. °· You have a fever that does not get better with medicine. °· You feel nauseous. °· You vomit. °· You have redness, swelling, or pain around the insertion site. °· You have fluid or blood coming from the insertion site. °· Your insertion area feels warm to the touch. °· You have pus or a bad smell coming from the insertion site. °Get help right away if: °· You have difficulty breathing or  shortness of breath. °· You develop chest pain. °· You faint. °· You feel very dizzy. °These symptoms may represent a serious problem that is an emergency. Do not wait to see if the symptoms will go away. Get medical help right away. Call your local emergency services (911 in the [place] Do not drive yourself to the hospital. °Summary °· After your procedure, it is common to have bruising or mild discomfort in the area where the IV was inserted. °· You should check your IV insertion area every day for signs of infection. °· Take over-the-counter and prescription medicines only as told by your health care provider. °· You should drink a lot of fluids for the first several days after the procedure to help flush the contrast from your body. °This information is not intended to replace advice given to you by your health care provider. Make sure you discuss any questions you have with your health care provider. °Document Released: 04/11/2013 Document Revised: 05/15/2016 Document Reviewed: 05/15/2016 °Elsevier Interactive Patient Education © 2017 [employer] ° °

## 2016-11-08 NOTE — Interval H&P Note (Signed)
History and Physical Interval Note:  11/08/2016 11:06 AM  [name]  has presented today for surgery, with the diagnosis of end stage renal disease  The various methods of treatment have been discussed with the patient and family. After consideration of risks, benefits and other options for treatment, the patient has consented to  Procedure(s): Bilateral Upper Extremity Venography (Bilateral) as a surgical intervention .  The patient's history has been reviewed, patient examined, no change in status, stable for surgery.  I have reviewed the patient's chart and labs.  Questions were answered to the patient's satisfaction.     Joanne Carlson

## 2016-11-08 NOTE — Op Note (Signed)
    OPERATIVE NOTE   PROCEDURE: 1.  bilateral arm and central venogram   PRE-OPERATIVE DIAGNOSIS: end stage renal disease  POST-OPERATIVE DIAGNOSIS: same as above   SURGEON: Adele Barthel, MD  ANESTHESIA: local  ESTIMATED BLOOD LOSS: 5 cc  FINDING(S): 1. Right arm:  Patent right basilic at antecubitum: 4-5 mm, 6 mm at axilla  Patent axillary, subclavian and right innominate vein    Right internal jugular vein tunneled dialysis catheter does not obturate superior vena cava  2. Left arm:  Small inadequate brachial and cephalic vein  Paired brachial veins join in proximal 1/3 of arm with 75-90% stenosis just proximal  Patent axillary and subclavian vein    Unable to fully visualized left innominate vein  SPECIMEN(S):  None  CONTRAST: 50 cc  INDICATIONS: Joanne Carlson is a 71 y.o. female who presents with end stage renal disease.  The patient is scheduled for bilateral arm and central venogram to help determine the availability of proximal veins for permanent access placement.  The patient is aware the risks include but are not limited to: bleeding, infection, thrombosis of the cannulated access, and possible anaphylactic reaction to the contrast.  The patient is aware of the risks of the procedure and elects to proceed forward.  DESCRIPTION: After full informed written consent was obtained, the patient was brought back to the angiography suite and placed supine upon the angiography table.  The patient was connected to monitoring equipment.  The right forearm IV was connected to IV extension tubing.  Hand injections were completed to image the arm veins and central venous structures, the findings of which are listed above.    The left forearm IV was connected to IV extension tubing.  Hand injections were completed to image the arm veins and central venous structures, the findings of which are listed above.    Based on the images, I would offer this patient a single stage  right basilic vein transposition.   COMPLICATIONS: none  CONDITION: stable   Adele Barthel, MD, [facility] [place] Office: [phone] Pager: [phone]  11/08/2016 2:38 PM

## 2016-11-09 ENCOUNTER — Encounter (HOSPITAL_COMMUNITY): Payer: Self-pay | Admitting: Vascular Surgery

## 2016-11-16 ENCOUNTER — Encounter (HOSPITAL_COMMUNITY): Payer: Self-pay | Admitting: *Deleted

## 2016-11-16 DIAGNOSIS — Z992 Dependence on renal dialysis: Secondary | ICD-10-CM | POA: Insufficient documentation

## 2016-11-16 NOTE — Anesthesia Preprocedure Evaluation (Addendum)
Anesthesia Evaluation  Patient identified by MRN, date of birth, ID band Patient awake    Reviewed: Allergy & Precautions, NPO status , Patient's Chart, lab work & pertinent test results  Airway Mallampati: II  TM Distance: >3 FB Neck ROM: Full    Dental  (+) Dental Advisory Given   Pulmonary former smoker,    breath sounds clear to auscultation       Cardiovascular hypertension, Pt. on medications + Peripheral Vascular Disease   Rhythm:Regular Rate:Normal     Neuro/Psych CVA    GI/Hepatic negative GI ROS, Neg liver ROS,   Endo/Other  diabetes, Type 2  Renal/GU ESRF and DialysisRenal disease     Musculoskeletal  (+) Arthritis ,   Abdominal   Peds  Hematology negative hematology ROS (+)   Anesthesia Other Findings   Reproductive/Obstetrics                            Lab Results  Component Value Date   WBC 17.8 (H) 01/21/2016   HGB 13.9 11/17/2016   HCT 41.0 11/17/2016   MCV 98.2 01/21/2016   PLT 307 01/21/2016   Lab Results  Component Value Date   CREATININE 5.50 (H) 11/08/2016   BUN 36 (H) 11/08/2016   NA 138 11/17/2016   K 3.2 (L) 11/17/2016   CL 96 (L) 11/08/2016   CO2 17 (L) 01/21/2016    Anesthesia Physical Anesthesia Plan  ASA: III  Anesthesia Plan: General   Post-op Pain Management:    Induction: Intravenous  Airway Management Planned: LMA  Additional Equipment:   Intra-op Plan:   Post-operative Plan: Extubation in OR  Informed Consent: I have reviewed the patients History and Physical, chart, labs and discussed the procedure including the risks, benefits and alternatives for the proposed anesthesia with the patient or authorized representative who has indicated his/her understanding and acceptance.   Dental advisory given  Plan Discussed with: CRNA  Anesthesia Plan Comments:        Anesthesia Quick Evaluation

## 2016-11-17 ENCOUNTER — Ambulatory Visit (HOSPITAL_COMMUNITY): Payer: Medicare HMO | Admitting: Certified Registered Nurse Anesthetist

## 2016-11-17 ENCOUNTER — Ambulatory Visit (HOSPITAL_COMMUNITY)
Admission: RE | Admit: 2016-11-17 | Discharge: 2016-11-17 | Disposition: A | Discharge status: Home / Self Care | Payer: Medicare HMO | Source: Ambulatory Visit | Attending: Vascular Surgery | Admitting: Vascular Surgery

## 2016-11-17 ENCOUNTER — Telehealth: Payer: Self-pay | Admitting: Vascular Surgery

## 2016-11-17 ENCOUNTER — Encounter (HOSPITAL_COMMUNITY)
Admission: RE | Disposition: A | Discharge status: Home / Self Care | Payer: Self-pay | Source: Ambulatory Visit | Attending: Vascular Surgery

## 2016-11-17 ENCOUNTER — Encounter (HOSPITAL_COMMUNITY): Payer: Self-pay | Admitting: Certified Registered Nurse Anesthetist

## 2016-11-17 DIAGNOSIS — I12 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 stage renal disease: Secondary | ICD-10-CM | POA: Diagnosis not present

## 2016-11-17 DIAGNOSIS — Z8673 Personal history of transient ischemic attack (TIA), and cerebral infarction without residual deficits: Secondary | ICD-10-CM | POA: Diagnosis not present

## 2016-11-17 DIAGNOSIS — Z79899 Other long term (current) drug therapy: Secondary | ICD-10-CM | POA: Diagnosis not present

## 2016-11-17 DIAGNOSIS — E1122 Type 2 diabetes mellitus with diabetic chronic kidney disease: Secondary | ICD-10-CM | POA: Diagnosis not present

## 2016-11-17 DIAGNOSIS — M109 Gout, unspecified: Secondary | ICD-10-CM | POA: Diagnosis not present

## 2016-11-17 DIAGNOSIS — Z87891 Personal history of nicotine dependence: Secondary | ICD-10-CM | POA: Insufficient documentation

## 2016-11-17 DIAGNOSIS — N185 Chronic kidney disease, stage 5: Secondary | ICD-10-CM | POA: Diagnosis not present

## 2016-11-17 DIAGNOSIS — E1142 Type 2 diabetes mellitus with diabetic polyneuropathy: Secondary | ICD-10-CM | POA: Diagnosis not present

## 2016-11-17 DIAGNOSIS — N186 End stage renal disease: Secondary | ICD-10-CM | POA: Insufficient documentation

## 2016-11-17 DIAGNOSIS — E785 Hyperlipidemia, unspecified: Secondary | ICD-10-CM | POA: Diagnosis not present

## 2016-11-17 DIAGNOSIS — E1151 Type 2 diabetes mellitus with diabetic peripheral angiopathy without gangrene: Secondary | ICD-10-CM | POA: Insufficient documentation

## 2016-11-17 DIAGNOSIS — Z7982 Long term (current) use of aspirin: Secondary | ICD-10-CM | POA: Diagnosis not present

## 2016-11-17 DIAGNOSIS — M1990 Unspecified osteoarthritis, unspecified site: Secondary | ICD-10-CM | POA: Diagnosis not present

## 2016-11-17 HISTORY — DX: End stage renal disease: N18.6

## 2016-11-17 HISTORY — PX: BASCILIC VEIN TRANSPOSITION: SHX5742

## 2016-11-17 LAB — POCT I-STAT 4, (NA,K, GLUC, HGB,HCT)
Glucose, Bld: 104 mg/dL — ABNORMAL HIGH (ref 65–99)
HEMATOCRIT: 41 % (ref 36.0–46.0)
HEMOGLOBIN: 13.9 g/dL (ref 12.0–15.0)
POTASSIUM: 3.2 mmol/L — AB (ref 3.5–5.1)
SODIUM: 138 mmol/L (ref 135–145)

## 2016-11-17 LAB — NO BLOOD PRODUCTS

## 2016-11-17 LAB — GLUCOSE, CAPILLARY: GLUCOSE-CAPILLARY: 80 mg/dL (ref 65–99)

## 2016-11-17 SURGERY — TRANSPOSITION, VEIN, BASILIC
Anesthesia: General | Site: Arm Upper | Laterality: Right

## 2016-11-17 MED ORDER — SODIUM CHLORIDE 0.9 % IV SOLN
INTRAVENOUS | Status: DC | PRN
Start: 2016-11-17 — End: 2016-11-17
  Administered 2016-11-17: 09:00:00

## 2016-11-17 MED ORDER — PHENYLEPHRINE 40 MCG/ML (10ML) SYRINGE FOR IV PUSH (FOR BLOOD PRESSURE SUPPORT)
PREFILLED_SYRINGE | INTRAVENOUS | Status: AC
  Filled 2016-11-17: qty 10

## 2016-11-17 MED ORDER — OXYCODONE HCL 5 MG PO TABS
5.0000 mg | ORAL_TABLET | Freq: Once | ORAL | Status: AC
  Administered 2016-11-17: 5 mg via ORAL

## 2016-11-17 MED ORDER — FENTANYL CITRATE (PF) 100 MCG/2ML IJ SOLN
INTRAMUSCULAR | Status: DC | PRN
  Administered 2016-11-17: 50 ug via INTRAVENOUS
  Administered 2016-11-17 (×5): 25 ug via INTRAVENOUS

## 2016-11-17 MED ORDER — PROPOFOL 10 MG/ML IV BOLUS
INTRAVENOUS | Status: AC
Start: 2016-11-17 — End: ?
  Filled 2016-11-17: qty 40

## 2016-11-17 MED ORDER — SODIUM CHLORIDE 0.9 % IV SOLN
INTRAVENOUS | Status: DC | PRN
  Administered 2016-11-17: 08:00:00 via INTRAVENOUS

## 2016-11-17 MED ORDER — PHENYLEPHRINE HCL 10 MG/ML IJ SOLN
INTRAMUSCULAR | Status: DC | PRN
  Administered 2016-11-17 (×3): 40 ug via INTRAVENOUS

## 2016-11-17 MED ORDER — ONDANSETRON HCL 4 MG/2ML IJ SOLN
INTRAMUSCULAR | Status: DC | PRN
  Administered 2016-11-17: 4 mg via INTRAVENOUS

## 2016-11-17 MED ORDER — MIDAZOLAM HCL 2 MG/2ML IJ SOLN
INTRAMUSCULAR | Status: AC
  Filled 2016-11-17: qty 2

## 2016-11-17 MED ORDER — OXYCODONE HCL 5 MG PO TABS: 5.0000 mg | ORAL_TABLET | Freq: Four times a day (QID) | ORAL | 0 refills | Status: DC | PRN

## 2016-11-17 MED ORDER — LIDOCAINE HCL (CARDIAC) 20 MG/ML IV SOLN
INTRAVENOUS | Status: DC | PRN
  Administered 2016-11-17: 60 mg via INTRAVENOUS
  Administered 2016-11-17: 40 mg via INTRAVENOUS

## 2016-11-17 MED ORDER — FENTANYL CITRATE (PF) 250 MCG/5ML IJ SOLN
INTRAMUSCULAR | Status: AC
  Filled 2016-11-17: qty 5

## 2016-11-17 MED ORDER — DEXTROSE 5 % IV SOLN
1.5000 g | INTRAVENOUS | Status: AC
  Administered 2016-11-17: 1.5 g via INTRAVENOUS

## 2016-11-17 MED ORDER — LIDOCAINE HCL 1 % IJ SOLN
INTRAMUSCULAR | Status: AC
  Filled 2016-11-17: qty 20

## 2016-11-17 MED ORDER — HEMOSTATIC AGENTS (NO CHARGE) OPTIME
TOPICAL | Status: DC | PRN
  Administered 2016-11-17: 2 via TOPICAL

## 2016-11-17 MED ORDER — LIDOCAINE HCL (PF) 1 % IJ SOLN
INTRAMUSCULAR | Status: DC | PRN
  Administered 2016-11-17: 20 mL

## 2016-11-17 MED ORDER — ONDANSETRON HCL 4 MG/2ML IJ SOLN
INTRAMUSCULAR | Status: AC
  Filled 2016-11-17: qty 2

## 2016-11-17 MED ORDER — 0.9 % SODIUM CHLORIDE (POUR BTL) OPTIME
TOPICAL | Status: DC | PRN
  Administered 2016-11-17: 1000 mL

## 2016-11-17 MED ORDER — FENTANYL CITRATE (PF) 100 MCG/2ML IJ SOLN
25.0000 ug | INTRAMUSCULAR | Status: DC | PRN
  Administered 2016-11-17: 25 ug via INTRAVENOUS

## 2016-11-17 MED ORDER — OXYCODONE HCL 5 MG PO TABS
ORAL_TABLET | ORAL | Status: AC
  Administered 2016-11-17: 5 mg via ORAL
  Filled 2016-11-17: qty 1

## 2016-11-17 MED ORDER — PROPOFOL 10 MG/ML IV BOLUS
INTRAVENOUS | Status: DC | PRN
Start: 2016-11-17 — End: 2016-11-17
  Administered 2016-11-17: 10 mg via INTRAVENOUS
  Administered 2016-11-17 (×2): 20 mg via INTRAVENOUS
  Administered 2016-11-17: 100 mg via INTRAVENOUS
  Administered 2016-11-17: 10 mg via INTRAVENOUS
  Administered 2016-11-17 (×2): 100 mg via INTRAVENOUS

## 2016-11-17 MED ORDER — FENTANYL CITRATE (PF) 100 MCG/2ML IJ SOLN
INTRAMUSCULAR | Status: AC
  Filled 2016-11-17: qty 2

## 2016-11-17 MED ORDER — PHENYLEPHRINE HCL 10 MG/ML IJ SOLN
INTRAVENOUS | Status: DC | PRN
  Administered 2016-11-17: 30 ug/min via INTRAVENOUS

## 2016-11-17 SURGICAL SUPPLY — 47 items
ADH SKN CLS APL DERMABOND .7 (GAUZE/BANDAGES/DRESSINGS) ×1
AGENT HMST SPONGE THK3/8 (HEMOSTASIS)
ARMBAND PINK RESTRICT EXTREMIT (MISCELLANEOUS) ×3 IMPLANT
CANISTER SUCT 3000ML PPV (MISCELLANEOUS) ×3 IMPLANT
CLIP TI MEDIUM 24 (CLIP) ×3 IMPLANT
CLIP TI WIDE RED SMALL 24 (CLIP) ×3 IMPLANT
CORDS BIPOLAR (ELECTRODE) IMPLANT
COVER PROBE W GEL 5X96 (DRAPES) ×2 IMPLANT
DECANTER SPIKE VIAL GLASS SM (MISCELLANEOUS) ×3 IMPLANT
DERMABOND ADVANCED (GAUZE/BANDAGES/DRESSINGS) ×2
DERMABOND ADVANCED .7 DNX12 (GAUZE/BANDAGES/DRESSINGS) ×1 IMPLANT
DRAIN CHANNEL 10F 3/8 F FF (DRAIN) ×2 IMPLANT
DRSG COVADERM 4X6 (GAUZE/BANDAGES/DRESSINGS) ×2 IMPLANT
DRSG COVADERM 4X8 (GAUZE/BANDAGES/DRESSINGS) ×2 IMPLANT
ELECT REM PT RETURN 9FT ADLT (ELECTROSURGICAL) ×3
ELECTRODE REM PT RTRN 9FT ADLT (ELECTROSURGICAL) ×1 IMPLANT
EVACUATOR SILICONE 100CC (DRAIN) ×2 IMPLANT
GLOVE BIO SURGEON STRL SZ 6.5 (GLOVE) ×3 IMPLANT
GLOVE BIO SURGEON STRL SZ7 (GLOVE) ×3 IMPLANT
GLOVE BIO SURGEON STRL SZ7.5 (GLOVE) ×2 IMPLANT
GLOVE BIO SURGEONS STRL SZ 6.5 (GLOVE) ×3
GLOVE BIOGEL PI IND STRL 7.0 (GLOVE) IMPLANT
GLOVE BIOGEL PI IND STRL 7.5 (GLOVE) ×1 IMPLANT
GLOVE BIOGEL PI INDICATOR 7.0 (GLOVE) ×6
GLOVE BIOGEL PI INDICATOR 7.5 (GLOVE) ×2
GOWN STRL REUS W/ TWL LRG LVL3 (GOWN DISPOSABLE) ×3 IMPLANT
GOWN STRL REUS W/TWL LRG LVL3 (GOWN DISPOSABLE) ×9
HEMOSTAT SPONGE AVITENE ULTRA (HEMOSTASIS) IMPLANT
KIT BASIN OR (CUSTOM PROCEDURE TRAY) ×3 IMPLANT
KIT ROOM TURNOVER OR (KITS) ×3 IMPLANT
MARKER SKIN DUAL TIP RULER LAB (MISCELLANEOUS) ×2 IMPLANT
NS IRRIG 1000ML POUR BTL (IV SOLUTION) ×3 IMPLANT
PACK CV ACCESS (CUSTOM PROCEDURE TRAY) ×3 IMPLANT
PAD ARMBOARD 7.5X6 YLW CONV (MISCELLANEOUS) ×6 IMPLANT
STAPLER VISISTAT 35W (STAPLE) ×4 IMPLANT
SUT MNCRL AB 4-0 PS2 18 (SUTURE) ×3 IMPLANT
SUT PROLENE 6 0 BV (SUTURE) ×7 IMPLANT
SUT PROLENE 7 0 BV 1 (SUTURE) IMPLANT
SUT SILK 2 0 SH (SUTURE) ×4 IMPLANT
SUT SILK 3 0 (SUTURE) ×3
SUT SILK 3-0 18XBRD TIE 12 (SUTURE) IMPLANT
SUT VIC AB 2-0 CT1 27 (SUTURE) ×3
SUT VIC AB 2-0 CT1 TAPERPNT 27 (SUTURE) ×1 IMPLANT
SUT VIC AB 3-0 SH 27 (SUTURE) ×12
SUT VIC AB 3-0 SH 27X BRD (SUTURE) ×2 IMPLANT
UNDERPAD 30X30 (UNDERPADS AND DIAPERS) ×3 IMPLANT
WATER STERILE IRR 1000ML POUR (IV SOLUTION) ×3 IMPLANT

## 2016-11-17 NOTE — Telephone Encounter (Signed)
Right BVT  Received: Today  Message Contents  McChesney, Tania Ade, RN  P [facility] Admin Pool      Previous Messages    ----- Message -----  From: Natividad Brood  Sent: 11/17/2016 11:17 AM  To: [facility] Charge Pool   This was a right BVT not left.Marland KitchenMarland KitchenSorry

## 2016-11-17 NOTE — Telephone Encounter (Signed)
Sched drain removal 11/19/16 at 11:15 and MD 12/17/16 at 11:45. Lm on cell# for pt to confirm appts.

## 2016-11-17 NOTE — Discharge Instructions (Signed)
° ° °  11/17/2016 Joanne Carlson [id number] 03/18/1946  Surgeon(s): Conrad Woodman, MD  Procedure(s): BASILIC VEIN TRANSPOSITION- RIGHT SINGLE STAGE  x Do not stick fistula for 12 weeks

## 2016-11-17 NOTE — Anesthesia Postprocedure Evaluation (Signed)
Anesthesia Post Note  Patient: Joanne Carlson  Procedure(s) Performed: Procedure(s) (LRB): [place] (Right)  Patient location during evaluation: PACU Anesthesia Type: General Level of consciousness: awake and alert Pain management: pain level controlled Vital Signs Assessment: post-procedure vital signs reviewed and stable Respiratory status: spontaneous breathing, nonlabored ventilation, respiratory function stable and patient connected to nasal cannula oxygen Cardiovascular status: blood pressure returned to baseline and stable Postop Assessment: no signs of nausea or vomiting Anesthetic complications: no       Last Vitals:  Vitals:   11/17/16 1221 11/17/16 1236  BP: (!) 106/56 (!) 108/53  Pulse: 76 77  Resp: 16 19  Temp:      Last Pain:  Vitals:   11/17/16 1225  TempSrc:   PainSc: 4                  Tiajuana Amass

## 2016-11-17 NOTE — Anesthesia Procedure Notes (Signed)
Procedure Name: LMA Insertion Date/Time: 11/17/2016 8:53 AM Performed by: Merdis Delay Pre-anesthesia Checklist: Patient identified, Emergency Drugs available, Patient being monitored, Timeout performed and Suction available Patient Re-evaluated:Patient Re-evaluated prior to inductionOxygen Delivery Method: Circle system utilized Preoxygenation: Pre-oxygenation with 100% oxygen Intubation Type: IV induction Ventilation: Mask ventilation without difficulty LMA: LMA inserted LMA Size: 4.0 Tube type: Oral Number of attempts: 1 Placement Confirmation: positive ETCO2 and breath sounds checked- equal and bilateral Tube secured with: Tape Dental Injury: Teeth and Oropharynx as per pre-operative assessment

## 2016-11-17 NOTE — H&P (View-Only) (Signed)
Established Dialysis Access   History of Present Illness   Joanne Carlson is a 71 y.o. (09-Aug-1945) female who presents for re-evaluation for permanent access.  The patient is right hand dominant.  Previous access procedures have been completed in both arms.  The patient's complication from previous access procedures include: thrombosis.  The patient has never had a previous PPM placed.  Past Medical History:  Diagnosis Date  . Arthritis   . Chronic kidney disease   . Diabetes mellitus    takes Glipizide daily  . Dizziness    occasionally  . History of colon polyps   . History of gout    takes Allopurinol daily  . Hyperlipidemia    takes Pravastatin daily  . Hypertension    takes Monopril daily  . Joint pain   . Peripheral neuropathy   . Peripheral vascular disease ([place])   . Pneumonia    hx of-yrs ago  . Stroke [facility])     Past Surgical History:  Procedure Laterality Date  . APPENDECTOMY    . BASCILIC VEIN TRANSPOSITION Right 10/03/2013   Procedure: BRACHIOCEPHALIC Arteriovenous Fistula ;  Surgeon: Mal Misty, MD;  Location: [place];  Service: Vascular;  Laterality: Right;  . COLONOSCOPY    . cyst removed from lower abdomen  80's  . ESOPHAGOGASTRODUODENOSCOPY    . rotator cuff surgery Right 2013  . TUBAL LIGATION      Social History   Social History  . Marital status: Widowed    Spouse name: N/A  . Number of children: N/A  . Years of education: N/A   Occupational History  . Not on file.   Social History Main Topics  . Smoking status: Former Smoker    Years: 10.00    Types: Cigarettes  . Smokeless tobacco: Never Used     Comment: quit smoking over 30+yrs ago  . Alcohol use No  . Drug use: No  . Sexual activity: No   Other Topics Concern  . Not on file   Social History Narrative  . No narrative on file    Family History  Problem Relation Age of Onset  . Depression Mother   . Hypertension Mother   . Depression Father   . Depression Sister     . Hypertension Sister     Current Outpatient Prescriptions  Medication Sig Dispense Refill  . allopurinol (ZYLOPRIM) 100 MG tablet Take 100 mg by mouth daily.     Marland Kitchen aspirin EC 81 MG tablet Take 81 mg by mouth daily.    . calcitRIOL (ROCALTROL) 0.5 MCG capsule Take 0.5 mcg by mouth daily.    . calcium acetate (PHOSLO) 667 MG capsule Take 667 mg by mouth 3 (three) times daily with meals.     . cinacalcet (SENSIPAR) 30 MG tablet Take 30 mg by mouth daily.    . fosinopril (MONOPRIL) 20 MG tablet Take 30 mg by mouth daily. Take 1 1/2 qd    . furosemide (LASIX) 80 MG tablet Take 80 mg by mouth 2 (two) times daily.    Marland Kitchen gabapentin (NEURONTIN) 100 MG capsule Take 1 capsule by mouth every morning and 1 capsule at bedtime. PATIENT NEEDS OFFICE VISIT FOR ADDITIONAL REFILLS 60 capsule 0  . glucose blood test strip Use to test blood sugar daily. Dx code: 250.02. 100 each 3  . Lancets MISC Use to test blood sugar daily. Dx code 250.02. 100 each 3  . pravastatin (PRAVACHOL) 20 MG tablet Take 20  mg by mouth daily.    . methocarbamol (ROBAXIN) 500 MG tablet Take 500 mg by mouth every 6 (six) hours as needed. Reported on 09/30/2015    . oxyCODONE (ROXICODONE) 5 MG immediate release tablet Take 1 tablet (5 mg total) by mouth every 6 (six) hours as needed for severe pain. (Patient not taking: Reported on 09/30/2015) 30 tablet 0  . pregabalin (LYRICA) 50 MG capsule Take 50 mg by mouth daily.     No current facility-administered medications for this visit.      Allergies  Allergen Reactions  . Tylenol [Acetaminophen] Rash     REVIEW OF SYSTEMS:  (Positives checked otherwise negative)  CARDIOVASCULAR:   [ ]  chest pain,  [ ]  chest pressure,  [ ]  palpitations,  [ ]  shortness of breath when laying flat,  [ ]  shortness of breath with exertion,   [ ]  pain in feet when walking,  [ ]  pain in feet when laying flat, [ ]  history of blood clot in veins (DVT),  [ ]  history of phlebitis,  [ ]  swelling in legs,   [ ]  varicose veins  PULMONARY:   [ ]  productive cough,  [ ]  asthma,  [ ]  wheezing  NEUROLOGIC:   [ ]  weakness in arms or legs,  [ ]  numbness in arms or legs,  [ ]  difficulty speaking or slurred speech,  [ ]  temporary loss of vision in one eye,  [ ]  dizziness  HEMATOLOGIC:   [ ]  bleeding problems,  [ ]  problems with blood clotting too easily  MUSCULOSKEL:   [ ]  joint pain, [ ]  joint swelling  GASTROINTEST:   [ ]  vomiting blood,  [ ]  blood in stool     GENITOURINARY:   [ ]  burning with urination,  [ ]  blood in urine [x]  end stage renal disease-HD: T/R/S   PSYCHIATRIC:   [ ]  history of major depression  INTEGUMENTARY:   [ ]  rashes,  [ ]  ulcers  CONSTITUTIONAL:   [ ]  fever,  [ ]  chills   Physical Examination   Vitals:   10/29/16 1419  BP: 133/83  Pulse: 86  Resp: 18  Temp: 97.3 F (36.3 C)  TempSrc: Oral  SpO2: 96%  Weight: 182 lb 1.6 oz (82.6 kg)  Height: 5\' 5"  (1.651 m)    Body mass index is 30.3 kg/m.  General Alert, O x 3, WD, NAD  Pulmonary Sym exp, good B air movt, CTA B, RIJV TDC exiting R chest  Cardiac RRR, Nl S1, S2, no Murmurs, No rubs, No S3,S4  Vascular Vessel Right Left  Radial Palpable Palpable  Ulnar Palpable Palpable  Brachial Palpable Palpable    Gastrointestinal soft, non-distended, non-tender to palpation, No guarding or rebound, no HSM, no masses, no CVAT B, No palpable prominent aortic pulse,    Musculoskeletal M/S 5/5 throughout  , Extremities without ischemic changes  , No edema present, no palpable thrill, no bruit, thrombosed R BC AVF with palpable cord, prior healed incision in L wrist and antecubitum  Neurologic Pain and light touch intact in extremities  , Motor exam as listed above    Non-Invasive Vascular Imaging   BUE Doppler (10/29/2016):   R arm:   Brachial: tri, 5.9 mm  Radial: tri, 1.6 mm  Ulnar: tri, 1.6 mm  L arm:   Brachial: tri, 4.5 mm  Radial: tri, 1.0 mm  Ulnar: tri, 2.1 mm  B Vein  Mapping  (10/29/2016 ):   R arm: acceptable vein  conduits include basilic vein  L arm: acceptable vein conduits include basilic vein   Outside Studies/Documentation   10 pages of outside documents were reviewed including: outpatient nephrology chart.   Medical Decision Making   Joanne Carlson is a 71 y.o. female who presents with ESRD requiring hemodialysis.    Given repeated access failures, I recommend: B arm and central venogram.  Depending on the findings, will proceed with placement of next access, likely a single stage basilic vein transposition.  This is completed via IV placed in EACH arm. Risk of extremity and central venography include but are not limited to: anaphylaxis, bleeding, infection, and incomplete visualization of the venous system. The patient is aware of these risks and has elected to proceed.  She is scheduled for 7 MAY 18   Adele Barthel, MD, [facility] [place] Office: [phone] Pager: [phone]

## 2016-11-17 NOTE — Transfer of Care (Signed)
Immediate Anesthesia Transfer of Care Note  Patient: Joanne Carlson  Procedure(s) Performed: Procedure(s): [place] (Right)  Patient Location: PACU  Anesthesia Type:General  Level of Consciousness: drowsy and patient cooperative  Airway & Oxygen Therapy: Patient Spontanous Breathing and Patient connected to face mask oxygen  Post-op Assessment: Report given to RN and Post -op Vital signs reviewed and stable  Post vital signs: Reviewed and stable  Last Vitals:  Vitals:   11/17/16 0722  BP: (!) 146/75  Pulse: 80  Resp: 20  Temp: 36.8 C    Last Pain:  Vitals:   11/17/16 0722  TempSrc: Oral      Patients Stated Pain Goal: 5 (99/35/70 1779)  Complications: No apparent anesthesia complications

## 2016-11-17 NOTE — Telephone Encounter (Signed)
-----   Message from Mena Goes, RN sent at 11/17/2016 11:48 AM EDT ----- Regarding: 4 weeks   ----- Message ----- From: Gabriel Earing, PA-C Sent: 11/17/2016  11:11 AM To: Vvs Charge Pool  S/p left BVT 11/17/16.  F/u with Dr. Bridgett Larsson in 4 weeks. No duplex needed.  Thanks, Aldona Bar

## 2016-11-17 NOTE — Op Note (Signed)
OPERATIVE NOTE   PROCEDURE: 1. Right single stage basilic vein transposition   PRE-OPERATIVE DIAGNOSIS: end stage renal disease   POST-OPERATIVE DIAGNOSIS: same as above    SURGEON: Adele Barthel, MD  ASSISTANT(S): Leontine Locket, PAC   ANESTHESIA: general  ESTIMATED BLOOD LOSS: 50 cc  FINDING(S): 1.  Basilic vein >6 mm throughout most of length 2.  Cubital vein drains in basilic vein: passes 5 mm dilator without resistance 3.  Faintly palpable thrill at end of case 4.  Dopplerable radial signal at end of case  SPECIMEN(S):  none   INDICATIONS:   Joanne Carlson is a 71 y.o. female who presents with end stage renal disease.  He recent venogram demonstrated a large right basilic vein that was compatible with single stage transposition.  Risk, benefits, and alternatives to access surgery were discussed.  The patient is aware the risks include but are not limited to: bleeding, infection, steal syndrome, nerve damage, ischemic monomelic neuropathy, thrombosis, failure to mature, need for additional procedures, death and stroke.  The patient agrees to proceed forward with the procedure.   DESCRIPTION: After obtaining full informed written consent, the patient was brought back to the operating room and placed supine upon the operating table.  The patient received IV antibiotics prior to induction.  A procedure time out was completed and the correct surgical site was verified.  After obtaining adequate anesthesia, the patient was prepped and draped in the standard fashion for: right arm access procedure.  I identified the right basilic vein from the antecubitum up to the axilla using a Sonosite.  I marked this path on the skin.  I also identified a large cubital vein that crossed over the brachial artery.  I marked this also.  I injected 15 cc of 1% lidocaine without epinephrine in the skin overlying the basilic vein.  I made an incision overlying the cubital vein draining into the  basilic vein and continued the incision into the proximal arm.  I dissected down to the vein with electrocautery.  The vein was 6-8 mm deep near the antecubital but from the distal 1/3 of the arm and more proximal, was easily >2 cm deep.  I dissected out the cubital vein draining into the basilic vein with electrocautery and blunt dissection.  I ligated side branches in the process.  I came across the forearm branch of the basilic vein, but this appeared to be smaller than the cubital vein.  I ligated this branch.  Eventually, I was able to mobilize the vein all the wall up to the axilla.  I felt this vein was of good quality with obvious inflammatory changes or scarring.  The cubital vein segment appeared to be 3-4 mm in diameter externally with most of the main basilic vein > 6 mm.  I then turned my attention to dissecting out the brachial artery in the antecubitum.  In this process, I came across a prior anastomosis.  I dissected out the brachial artery proximal to this old anastomosis.  I placed vessel loops proximally and distally.    At this point, I clamped the cubital vein distally and transected the vein.  I tied off the distal segment with 2-0 silk.  I then instilled heparinized saline in the vein while clamping the proximal segment.  The vein conduit distended to 5 mm distally and >6 mm through the main vein.   I marked the anterior orientation of this vein.  I could easily pass a 5 mm  dilator throughout the cubital vein segment without resistance.  I then dissected from the antecubitum to the axilla with a curvilinear dissector.  I tied the vein to the inner cannula with a 2-0 silk stitch and then delivered the vein, maintaining orientation through the subcutaneous tunnel.  I removed the metal tunnel and sharply released the vein.  I clamped the distal end of the vein and removed the proximal clamp on the vein.    I reset my exposure of the brachial artery.  I placed it under tension proximally  and distally with the vessel loops.  I made an arteriotomy and then extended this with a Potts scissor.  This arteriotomy was only 3.5 mm.  I sewed the basilic vein to the brachial artery in an end-to-side configuration with a running stitch of 6-0 Prolene.  Prior to completing the anastomosis, I allowed both ends of the artery to bleed: no clot was noted.  There was no backbleeding from the vein conduit due to competent valves.  I injected heparinized saline in the fistula without any resistance.  I completed this anastomosis in the usual fashion.  I released all clamps and vessels loops.  I could feel a faint thrill in the fistula.  Interrogating the fistula with a continuous doppler, all flow signatures were appropriate.  There was a dopplerable radial signal at the end of the case.  The flow within the axillary vein was consistent with widely patent arteriovenous fistula without any pulsatile component.  At this point, the entire incision was washed out and packed with Avitene.  After waiting a few minutes, I removed the Avitene and washed out the incision again.  Bleeding points were controlled with electrocautery.  Due to the depth of the vein, I elected to place a drain, 15 Blake was selected.  I sharply route the drain distally with a trocar and then shortened the drain for the length of the arm.  I secured the drain in place with a 2-0 silk stitch tied to the drain.  The drain was placed in the incision base, adjacent to the median antecubital sensory trunk, which was completely intact.  I closed the fascia over the drain with interrupted stitches of 3-0 Vicryl.  I then closed the superficial subcutaneous tissue with a running stitch of 3-0 Vicryl.  The skin was reapproximated with staples.  The skin was cleaned, dried, and dressed with a Coverall dressing.  The drain was attached to bulb suction and charged.   COMPLICATIONS: none  CONDITION: stable   Adele Barthel, MD, [facility] [place] Office: [phone] Pager: [phone]  11/17/2016, 11:09 AM

## 2016-11-17 NOTE — Interval H&P Note (Signed)
   History and Physical Update  The patient was interviewed and re-examined.  The patient's previous History and Physical has been reviewed and is unchanged from my consult except for: interval B venogram.  Based on venogram, patient is a candidate for a R single stage basilic vein transposition.   Risk, benefits, and alternatives to access surgery were discussed.    The patient is aware the risks include but are not limited to: bleeding, infection, steal syndrome, nerve damage, ischemic monomelic neuropathy, thrombosis, failure to mature, need for additional procedures, death and stroke.    The patient agrees to proceed forward with the procedure.  Adele Barthel, MD, FACS [facility] [place] Office: [phone] Pager: [phone]  11/17/2016, 6:49 AM

## 2016-11-17 NOTE — Telephone Encounter (Signed)
5-18 Drain Removal  Received: Today  Message Contents  McChesney, Tania Ade, RN  P [facility] Admin Pool      Previous Messages    ----- Message -----  From: Gabriel Earing, PA-C  Sent: 11/17/2016 11:13 AM  To: [facility]vs Charge Pool   She needs to return on Friday 11/19/16 to have drain removed. She can be a nurse visit at that time.   Thanks,  Aldona Bar

## 2016-11-18 ENCOUNTER — Encounter (HOSPITAL_COMMUNITY): Payer: Self-pay | Admitting: Vascular Surgery

## 2016-11-19 ENCOUNTER — Encounter: Payer: Self-pay | Admitting: Family

## 2016-11-19 ENCOUNTER — Ambulatory Visit (INDEPENDENT_AMBULATORY_CARE_PROVIDER_SITE_OTHER): Payer: Self-pay | Admitting: Family

## 2016-11-19 VITALS — BP 113/63 | HR 76 | Temp 97.7°F | Resp 16 | Ht 64.0 in | Wt 182.0 lb

## 2016-11-19 DIAGNOSIS — I770 Arteriovenous fistula, acquired: Secondary | ICD-10-CM

## 2016-11-19 DIAGNOSIS — Z992 Dependence on renal dialysis: Secondary | ICD-10-CM

## 2016-11-19 DIAGNOSIS — N186 End stage renal disease: Secondary | ICD-10-CM

## 2016-11-19 NOTE — Progress Notes (Signed)
    Postoperative Access Visit   History of Present Illness  Joanne Carlson is a 71 y.o. year old female who  Is s/p single stage basilic vein transposition right upper arm on 11-17-16 by Dr. Bridgett Larsson.  Findings: 1.  Basilic vein >6 mm throughout most of length 2.  Cubital vein drains in basilic vein: passes 5 mm dilator without resistance 3.  Faintly palpable thrill at end of case 4.  Dopplerable radial signal at end of case  Pt returns today for JP drain removal. Daughter states the drain has been emptied 3x at home since the surgery, and there was little drainage to empty the last time.  She dialyzes T-TH-S via right upper chest catheter.   On 12-17-16 she is scheduled to see Dr. Bridgett Larsson for 4 week post op visit.   The patient's incision is healing. The patient notes no steal symptoms.  The patient is able to complete their activities of daily living.  The patient's current symptoms are: none.  For VQI Use Only  PRE-ADM LIVING: Home  AMB STATUS: Ambulatory  Physical Examination Vitals:   11/19/16 1129  BP: 113/63  Pulse: 76  Resp: 16  Temp: 97.7 F (36.5 C)  TempSrc: Oral  SpO2: 97%  Weight: 182 lb (82.6 kg)  Height: 5\' 4"  (1.626 m)   Body mass index is 31.24 kg/m.  Right UE: Incision is healing with staples in place, skin feels warm and normal, hand grip is 5/5, sensation in digits is intact. Right radial pulse is faintly palpable.  Medical Decision Making  Joanne Carlson is a 71 y.o. year old female who presents s/p single stage basilic vein transposition right upper arm on 11-17-16.  JP drain was removed, site redressed. Follow up in 2 weeks for staples removal.  She is scheduled for 4 weeks post op visit with Dr. Bridgett Larsson on 12-17-16.   Thank you for allowing [place] to participate in this patient's care.  Breonna Gafford, Sharmon Leyden, RN, MSN, FNP-C [facility] [place] Office: [phone]  11/19/2016, 11:55 AM  Clinic MD: Bridgett Larsson

## 2016-11-22 DIAGNOSIS — Z23 Encounter for immunization: Secondary | ICD-10-CM | POA: Insufficient documentation

## 2016-11-24 ENCOUNTER — Encounter: Payer: Self-pay | Admitting: Family

## 2016-11-26 ENCOUNTER — Encounter (HOSPITAL_COMMUNITY): Payer: Self-pay

## 2016-11-26 ENCOUNTER — Emergency Department (HOSPITAL_COMMUNITY)
Admission: EM | Admit: 2016-11-26 | Discharge: 2016-11-27 | Disposition: A | Discharge status: Home / Self Care | Payer: Medicare HMO | Attending: Emergency Medicine | Admitting: Emergency Medicine

## 2016-11-26 DIAGNOSIS — E1140 Type 2 diabetes mellitus with diabetic neuropathy, unspecified: Secondary | ICD-10-CM | POA: Diagnosis not present

## 2016-11-26 DIAGNOSIS — Z87891 Personal history of nicotine dependence: Secondary | ICD-10-CM | POA: Insufficient documentation

## 2016-11-26 DIAGNOSIS — Z79899 Other long term (current) drug therapy: Secondary | ICD-10-CM | POA: Insufficient documentation

## 2016-11-26 DIAGNOSIS — K5641 Fecal impaction: Secondary | ICD-10-CM | POA: Diagnosis not present

## 2016-11-26 DIAGNOSIS — N186 End stage renal disease: Secondary | ICD-10-CM | POA: Diagnosis not present

## 2016-11-26 DIAGNOSIS — I12 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 stage renal disease: Secondary | ICD-10-CM | POA: Diagnosis not present

## 2016-11-26 DIAGNOSIS — K6289 Other specified diseases of anus and rectum: Secondary | ICD-10-CM | POA: Diagnosis present

## 2016-11-26 DIAGNOSIS — Z7982 Long term (current) use of aspirin: Secondary | ICD-10-CM | POA: Diagnosis not present

## 2016-11-26 MED ORDER — POLYETHYLENE GLYCOL 3350 17 G PO PACK: 17.0000 g | PACK | Freq: Two times a day (BID) | ORAL | 0 refills | Status: DC

## 2016-11-26 MED ORDER — MILK AND MOLASSES ENEMA
1.0000 | Freq: Once | RECTAL | Status: AC
  Administered 2016-11-26: 250 mL via RECTAL
  Filled 2016-11-26: qty 250

## 2016-11-26 NOTE — ED Provider Notes (Signed)
[facility] DEPT Provider Note   CSN: [id number] Arrival date & time: 11/26/16  1728   By signing my name below, I, Fabian Sharp, attest that this documentation has been prepared under the direction and in the presence of Margarita Mail, PA-C. Electronically Signed: Fabian Sharp, ED Scribe. 11/26/2016. 9:32 PM.  History   Chief Complaint Chief Complaint  Patient presents with  . Rectal Pain   HPI Comments: Joanne Carlson is a 71 y.o. female who presents to the Emergency Department complaining of worsening external rectal pain onset of three days ago. Patient feels pressure and pain in her rectum She has noticed some bleeding when she wipes. Last bowel movement was two days ago; however, she reports that this is baseline for her to not have a bowel movement in several days. She is able to pass flatus. No noted treatments were tried prior to coming into the ED. Pt denies abdominal pain, fever, chills, or any other associated symptoms. She dialyzes MWF and las t dialysis was yesterday.  The history is provided by the patient. No language interpreter was used.   Past Medical History:  Diagnosis Date  . Arthritis   . Diabetes mellitus    diet controlled-  . Dizziness    occasionally  . ESRD (end stage renal disease) ([place])    TTHS, [place]  . History of colon polyps   . History of gout    takes Allopurinol daily  . Hyperlipidemia    takes Pravastatin daily  . Hypertension    takes Monopril daily  . Joint pain   . Patient is Jehovah's Witness    NO BLOOD OR BLOOD PRODUCTS. Albumin okay.   . Peripheral neuropathy   . Peripheral vascular disease ([place])   . Pneumonia    hx of-yrs ago  . Stroke [facility])    Patient Active Problem List   Diagnosis Date Noted  . Chronic renal insufficiency, stage IV (severe) ([place]) 11/13/2013  . End stage renal disease ([place]) 07/20/2013  . Mechanical complication of other vascular device, implant, and graft 07/20/2013  . HYPERCHOLESTEROLEMIA  08/07/2008  . ANEMIA 08/07/2008  . RENAL INSUFFICIENCY 08/07/2008  . ALLERGIC RHINITIS 03/04/2008  . DIABETES MELLITUS, TYPE II 03/15/2007  . HYPERTENSION, BENIGN 03/15/2007   Past Surgical History:  Procedure Laterality Date  . APPENDECTOMY    . BASCILIC VEIN TRANSPOSITION Right 10/03/2013   Procedure: BRACHIOCEPHALIC Arteriovenous Fistula ;  Surgeon: Mal Misty, MD;  Location: [place];  Service: Vascular;  Laterality: Right;  . BASCILIC VEIN TRANSPOSITION Right 11/17/2016   Procedure: BASCILIC VEIN TRANSPOSITION- RIGHT SINGLE STAGE;  Surgeon: Conrad [place], MD;  Location: [place];  Service: Vascular;  Laterality: Right;  . COLONOSCOPY    . cyst removed from lower abdomen  80's  . ESOPHAGOGASTRODUODENOSCOPY    . rotator cuff surgery Right 2013  . TUBAL LIGATION    . UPPER EXTREMITY VENOGRAPHY Bilateral 11/08/2016   Procedure: Bilateral Upper Extremity Venography;  Surgeon: Conrad [place] Brook, MD;  Location: [facility];  Service: Cardiovascular;  Laterality: Bilateral;   OB History    No data available     Home Medications    Prior to Admission medications   Medication Sig Start Date End Date Taking? Authorizing Provider  allopurinol (ZYLOPRIM) 100 MG tablet Take 200 mg by mouth daily.  05/25/13   [provider]  aspirin EC 81 MG tablet Take 81 mg by mouth daily.    [provider]  calcium acetate (PHOSLO)  667 MG capsule Take 667-1,334 mg by mouth See admin instructions. Take 2 caps three times a day with meals. Take 1 cap with snacks twice daily. 06/22/13   [provider]  cinacalcet (SENSIPAR) 30 MG tablet Take 30 mg by mouth daily.    [provider]  fosinopril (MONOPRIL) 40 MG tablet Take 40 mg by mouth 2 (two) times daily. 09/28/16   [provider]  gabapentin (NEURONTIN) 100 MG capsule Take 1 capsule by mouth every morning and 1 capsule at bedtime. PATIENT NEEDS OFFICE VISIT FOR ADDITIONAL REFILLS Patient taking differently:  Take 100 mg by mouth daily as needed (pain).  05/25/13   Collene Leyden, PA-C  glucose blood test strip Use to test blood sugar daily. Dx code: 250.02. 11/06/12   Theda Sers, PA-C  Lancets MISC Use to test blood sugar daily. Dx code 96.02. 11/08/12   Collene Leyden, PA-C  multivitamin (RENA-VIT) TABS tablet Take 1 tablet by mouth daily.    [provider]  oxyCODONE (ROXICODONE) 5 MG immediate release tablet Take 1 tablet (5 mg total) by mouth every 6 (six) hours as needed. 11/17/16   Rhyne, Hulen Shouts, PA-C  polyethylene glycol (MIRALAX / GLYCOLAX) packet Take 17 g by mouth 2 (two) times daily. 11/26/16   Margarita Mail, PA-C  pravastatin (PRAVACHOL) 20 MG tablet Take 20 mg by mouth at bedtime.     [provider]   Family History Family History  Problem Relation Age of Onset  . Depression Mother   . Hypertension Mother   . Depression Father   . Depression Sister   . Hypertension Sister    Social History Social History  Substance Use Topics  . Smoking status: Former Smoker    Years: 10.00    Types: Cigarettes  . Smokeless tobacco: Never Used     Comment: quit smoking over 35+yrs ago  . Alcohol use No   Allergies   Tylenol [acetaminophen]  Review of Systems Review of Systems  A complete review of systems was obtained and all systems are negative except as noted in the HPI and PMH.   Physical Exam Updated Vital Signs BP (!) 128/100 (BP Location: Left Arm)   Pulse 98   Temp 97.9 F (36.6 C) (Oral)   Resp 19   Ht 5\' 4"  (1.626 m)   Wt 83 kg (183 lb)   SpO2 98%   BMI 31.41 kg/m   Physical Exam  Constitutional: She appears well-developed and well-nourished. No distress.  HENT:  Head: Normocephalic.  Eyes: Conjunctivae are normal.  Neck: Normal range of motion.  Cardiovascular: Normal rate.   Pulmonary/Chest: Effort normal.  Abdominal: She exhibits no distension.  Genitourinary:  Genitourinary Comments: Fecal impaction on exam.  No fissures.  Some tissue breakdown around the anus. No hemorrhoids or masses.   Musculoskeletal: Normal range of motion.  Neurological: She is alert.  Skin: No pallor.  Psychiatric: She has a normal mood and affect. Her behavior is normal.  Nursing note and vitals reviewed.  ED Treatments / Results  DIAGNOSTIC STUDIES: Oxygen Saturation is 98% on RA, normal by my interpretation.    COORDINATION OF CARE: 9:20 PM Discussed treatment plan with pt at bedside and pt agreed to plan.  Labs (all labs ordered are listed, but only abnormal results are displayed) Labs Reviewed - No data to display  EKG  EKG Interpretation None      Radiology No results found.  Procedures Fecal disimpaction Date/Time: 11/26/2016 9:23  PM Performed by: Margarita Mail Authorized by: Margarita Mail  Consent: Verbal consent obtained. Written consent not obtained. Risks and benefits: risks, benefits and alternatives were discussed Consent given by: patient Patient understanding: patient states understanding of the procedure being performed Patient consent: the patient's understanding of the procedure matches consent given Procedure consent: procedure consent matches procedure scheduled Relevant documents: relevant documents present and verified Test results: test results available and properly labeled Site marked: the operative site was marked Imaging studies: imaging studies available Required items: required blood products, implants, devices, and special equipment available Time out: Immediately prior to procedure a "time out" was called to verify the correct patient, procedure, equipment, support staff and site/side marked as required. Preparation: Patient was prepped and draped in the usual sterile fashion. Local anesthesia used: no  Anesthesia: Local anesthesia used: no  Sedation: Patient sedated: no Patient tolerance: Patient tolerated the procedure well with no immediate complications     Medications  Ordered in ED Medications  milk and molasses enema (250 mLs Rectal Given 11/26/16 2345)    Initial Impression / Assessment and Plan / ED Course  I have reviewed the triage vital signs and the nursing notes.  Pertinent labs & imaging results that were available during my care of the patient were reviewed by me and considered in my medical decision making (see chart for details).     Patient unable to tolerate PO fluids. The patient was unable to tolerate the enema. She will be discharged with miralax.  Her discomfort is greatly improved.  Final Clinical Impressions(s) / ED Diagnoses   Final diagnoses:  Fecal impaction [facility])   New Prescriptions Discharge Medication List as of 11/26/2016 11:53 PM    START taking these medications   Details  polyethylene glycol (MIRALAX / GLYCOLAX) packet Take 17 g by mouth 2 (two) times daily., Starting Fri 11/26/2016, Print       I personally performed the services described in this documentation, which was scribed in my presence. The recorded information has been reviewed and is accurate.        Margarita Mail, PA-C 11/27/16 1402

## 2016-11-26 NOTE — Discharge Instructions (Signed)
Contact a health care provider if: °You have ongoing pain in your rectum. °You need to use an enema or a suppository more than 2 times a week. °You have rectal bleeding. °You continue to have problems. The problems may include not being able to go to the bathroom and long-term (chronic) constipation. °You have pain in your abdomen. °You have thin, pencil-like stools. °

## 2016-11-26 NOTE — ED Triage Notes (Signed)
Pt reports pain to her rectum and states she has noticed "little knots" down there that sometimes bleeds. She states they are very painful. Pt is also a dialysis patient, last dialyzed yesterday. Tu/Thur/Sat. Pt denies any other symptoms other than her rectal pain.

## 2016-11-26 NOTE — ED Provider Notes (Signed)
Medical screening examination/treatment/procedure(s) were conducted as a shared visit with non-physician practitioner(s) and myself.  I personally evaluated the patient during the encounter. Briefly, the patient is a 71 y.o. female with a history of ESRD who recently had a right basilic vein transposition who was placed on narcotic pain medicine presents today with rectal pain who was found to be impacted. Abd benign. Disimpaction per mid-level provider.  Recommended MiraLAX while on the narcotics. The patient is safe for discharge with strict return precautions.      Fatima Blank, MD 11/28/16 [phone]

## 2016-12-02 DIAGNOSIS — E46 Unspecified protein-calorie malnutrition: Secondary | ICD-10-CM | POA: Insufficient documentation

## 2016-12-03 ENCOUNTER — Ambulatory Visit (INDEPENDENT_AMBULATORY_CARE_PROVIDER_SITE_OTHER): Payer: Self-pay | Admitting: Family

## 2016-12-03 ENCOUNTER — Encounter: Payer: Medicare HMO | Admitting: Family

## 2016-12-03 ENCOUNTER — Encounter: Payer: Self-pay | Admitting: Family

## 2016-12-03 VITALS — BP 143/71 | HR 93 | Resp 16 | Ht 64.0 in | Wt 180.5 lb

## 2016-12-03 DIAGNOSIS — Z4802 Encounter for removal of sutures: Secondary | ICD-10-CM

## 2016-12-03 DIAGNOSIS — N186 End stage renal disease: Secondary | ICD-10-CM

## 2016-12-03 DIAGNOSIS — I770 Arteriovenous fistula, acquired: Secondary | ICD-10-CM

## 2016-12-03 DIAGNOSIS — Z992 Dependence on renal dialysis: Secondary | ICD-10-CM

## 2016-12-03 NOTE — Progress Notes (Signed)
    Postoperative Access Visit   History of Present Illness  Joanne Carlson is a 71 y.o. year old female  who  Is s/p single stage basilic vein transposition right upper arm on 11-17-16 by Dr. Bridgett Larsson.  Findings: 1. Basilic vein >6 mm throughout most of length 2. Cubital vein drains in basilic vein: passes 5 mm dilator without resistance 3. Faintly palpable thrill at end of case 4. Dopplerable radial signal at end of case  Pt returns today for staples removal.  She dialyzes T-TH-S via right upper chest catheter.   On 12-17-16 she is scheduled to see Dr. Bridgett Larsson for 4 week post op visit.   The patient's incision is healing. The patient notes no steal symptoms.  The patient is able to complete their activities of daily living.  The patient's current symptoms are: none.  For VQI Use Only  PRE-ADM LIVING: Home  AMB STATUS: Ambulatory  Physical Examination Vitals:   12/03/16 1519  BP: (!) 143/71  Pulse: 93  Resp: 16  SpO2: 94%  Weight: 180 lb 8 oz (81.9 kg)  Height: 5\' 4"  (1.626 m)   Body mass index is 30.98 kg/m.  Right UE: Incision is healing with staples in place, all staples removed, skin feels warm and normal, hand grip is 5/5, sensation in digits is intact. Right radial pulse is faintly palpable. Right upper arm AV fistula has a palpable thrill and an audible bruit.   Medical Decision Making  Joanne Carlson is a 71 y.o. year old female who presents s/p single stage basilic vein transposition right upper arm on 11-17-16.  All staples removed, Incision line cleansed with wound cleanser, steri strips applied.  I advised pt that she may shower and gently cleanse right upper arm incision.  She needs to keep her right upper chest temporary catheter site dry.  She is scheduled for 4 weeks post op visit with Dr. Bridgett Larsson on 12-17-16.    Thank you for allowing [place] to participate in this patient's care.  Fayrene Towner, Sharmon Leyden, RN, MSN, FNP-C [facility] Office: [phone]  12/03/2016, 4:39 PM  Clinic MD: Bridgett Larsson

## 2016-12-04 DIAGNOSIS — R0602 Shortness of breath: Secondary | ICD-10-CM | POA: Insufficient documentation

## 2016-12-08 ENCOUNTER — Ambulatory Visit (INDEPENDENT_AMBULATORY_CARE_PROVIDER_SITE_OTHER): Payer: Medicare HMO

## 2016-12-08 ENCOUNTER — Ambulatory Visit (INDEPENDENT_AMBULATORY_CARE_PROVIDER_SITE_OTHER): Payer: Medicare HMO | Admitting: Podiatry

## 2016-12-08 ENCOUNTER — Encounter: Payer: Self-pay | Admitting: Podiatry

## 2016-12-08 VITALS — BP 88/62 | HR 86 | Temp 97.6°F

## 2016-12-08 DIAGNOSIS — R52 Pain, unspecified: Secondary | ICD-10-CM

## 2016-12-08 DIAGNOSIS — E1151 Type 2 diabetes mellitus with diabetic peripheral angiopathy without gangrene: Secondary | ICD-10-CM

## 2016-12-08 DIAGNOSIS — L02612 Cutaneous abscess of left foot: Secondary | ICD-10-CM

## 2016-12-08 DIAGNOSIS — L03032 Cellulitis of left toe: Secondary | ICD-10-CM | POA: Diagnosis not present

## 2016-12-08 MED ORDER — CEPHALEXIN 500 MG PO CAPS: 500.0000 mg | ORAL_CAPSULE | Freq: Two times a day (BID) | ORAL | 1 refills | Status: DC

## 2016-12-08 NOTE — Progress Notes (Signed)
   Subjective:    Patient ID: Joanne Carlson, female    DOB: 10-16-1945, 71 y.o.   MRN: [id number]  HPI    This patient presents today referred by Dr.Wong for left foot infection onset approximately 12/02/2016 evaluated at [facility] walking clinic on 12/05/2016. At that time patient placed on cephalexin 500 mg by mouth twice a day which is taking without a complaint. Patient states that the swelling of foot has increased slightly since the onset. Patient is diabetic with end-stage renal disease Patient denies any recent smoking history  Review of Systems  All other systems reviewed and are negative.      Objective:   Physical Exam  Orientated 3  Vascular: Low-grade edema erythema dorsal left foot extending from distal toes to midfoot DP pulses 2/4 bilaterally PT pulses trace palpable bilaterally  Neurological: Sensation to 10 g monofilament wire intact 4/5 right and 3/5 left Vibratory sensation reactive bilaterally Ankle reflexes reactive bilaterally  Dermatological: Bulla dorsal left hallux 4 x 3 cm Plantar reactive callus interphalangeal joint left hallux Punctured of bulla releases a brown, viscous drainage without malodor submitted for culture and sensitivity Debridement of plantar callus develops a 10 mm ulcer with some communication to the bulla with some release of the brown purulent drainage  X-ray examination left foot 12/08/2016  Intact bony structures fracture or dislocation Increased soft tissue density left hallux No cortical disruption in the phalanges of the hallux Vascular calcification first intermetatarsal space Decreased bone density noted all views  Radiographic impression: Increased soft tissue density left hallux No x-ray sign of osteomyelitis left hallux         Assessment & Plan:   Assessment: Diabetic with peripheral arterial disease Bulla/diabetic skin ulcer/cellulitis left hallux/foot  Plan: I&D bulla and submitted drainage for  culture and sensitivity Debrided plantar callus unroofing superficial ulcer Apply Betadine dressings to wounds Rx diluted Betadine soaks Dispensed surgical shoe Continue cephalexin 500 mg twice a day initial 7-10 days Patient instructed if she noticed any sudden increase in pain, swelling, redness, fever to present to the ED  Reappoint 6 days or sooner if patient has concern

## 2016-12-08 NOTE — Patient Instructions (Signed)
     Continue taking her cephalexin twice a day If you develop any sudden increase of pain, swelling, redness, fever presents emergency department   Betadine Soak Instructions  Purchase an 8 oz. bottle of BETADINE solution (Povidone)  THE DAY AFTER THE PROCEDURE  Place 1 tablespoon of betadine solution in a quart of warm tap water.  Submerge your foot or feet with outer bandage intact for the initial soak; this will allow the bandage to become moist and wet for easy lift off.  Once you remove your bandage, continue to soak in the solution for 2-3 minutes.  This soak should be done twice a day.  Next, remove your foot or feet from solution, blot dry the affected area and cover.  You may use a band aid large enough to cover the area or use gauze and tape.  Apply other medications to the area as directed by the doctor such as TRIPLE ANTIBIOTIC OITMENT  IF YOUR SKIN BECOMES IRRITATED WHILE USING THESE INSTRUCTIONS, IT IS OKAY TO SWITCH TO EPSOM SALTS AND WATER OR WHITE VINEGAR AND WATER.

## 2016-12-09 ENCOUNTER — Encounter: Payer: Self-pay | Admitting: Vascular Surgery

## 2016-12-09 NOTE — Progress Notes (Signed)
    Postoperative Access Visit   History of Present Illness   Joanne Carlson is a 71 y.o. year old female who presents for postoperative follow-up for: right single stage basilic vein transposition (Date: 11/17/16).  The patient's wounds are healed.  The patient notes no steal symptoms.  The patient is able to complete their activities of daily living.  The patient's current symptoms are: mild pain in incision site.   Physical Examination   Vitals:   12/17/16 1147  BP: (!) 152/79  Pulse: 87  Resp: 20  Temp: 100.2 F (37.9 C)  TempSrc: Oral  SpO2: 95%  Weight: 178 lb (80.7 kg)  Height: 5\' 4"  (1.626 m)    RUE: Incision is healed, skin feels warm hand grip is 5/5, sensation in digits is intact, palpable thrill, bruit can be auscultated, faintly palpable radial pulse   Medical Decision Making   Joanne Carlson is a 71 y.o. year old female who presents s/p right single stage basilic vein transposition   The patient's access is ready for use.  Pt would like to hold off on cannulation for another couple of weeks.  Thank you for allowing [place] to participate in this patient's care.   Adele Barthel, MD, FACS [facility] [place] Office: [phone] Pager: [phone]

## 2016-12-11 LAB — WOUND CULTURE: Organism ID, Bacteria: NORMAL

## 2016-12-15 ENCOUNTER — Ambulatory Visit (INDEPENDENT_AMBULATORY_CARE_PROVIDER_SITE_OTHER): Payer: Medicare HMO | Admitting: Podiatry

## 2016-12-15 ENCOUNTER — Encounter: Payer: Self-pay | Admitting: Podiatry

## 2016-12-15 VITALS — BP 144/58 | HR 81 | Temp 97.6°F

## 2016-12-15 DIAGNOSIS — L97521 Non-pressure chronic ulcer of other part of left foot limited to breakdown of skin: Secondary | ICD-10-CM

## 2016-12-15 NOTE — Patient Instructions (Addendum)
Continue daily diluted pavidoine on soaks 1-2 minutes daily Apply triple antibiotic ointment to the wounds on the great toe, cover with gauze and attach with Coflex tape Continue oral antibiotics cephalexin 500 mg by mouth twice a day from the prescription of 12/08/2016 Continue wearing surgical shoe on the left foot If he knows any sudden increase in pain, swelling, redness, fever presents emergency department  Diabetes and Foot Care Diabetes may cause you to have problems because of poor blood supply (circulation) to your feet and legs. This may cause the skin on your feet to become thinner, break easier, and heal more slowly. Your skin may become dry, and the skin may peel and crack. You may also have nerve damage in your legs and feet causing decreased feeling in them. You may not notice minor injuries to your feet that could lead to infections or more serious problems. Taking care of your feet is one of the most important things you can do for yourself. Follow these instructions at home:  Wear shoes at all times, even in the house. Do not go barefoot. Bare feet are easily injured.  Check your feet daily for blisters, cuts, and redness. If you cannot see the bottom of your feet, use a mirror or ask someone for help.  Wash your feet with warm water (do not use hot water) and mild soap. Then pat your feet and the areas between your toes until they are completely dry. Do not soak your feet as this can dry your skin.  Apply a moisturizing lotion or petroleum jelly (that does not contain alcohol and is unscented) to the skin on your feet and to dry, brittle toenails. Do not apply lotion between your toes.  Trim your toenails straight across. Do not dig under them or around the cuticle. File the edges of your nails with an emery board or nail file.  Do not cut corns or calluses or try to remove them with medicine.  Wear clean socks or stockings every day. Make sure they are not too tight. Do not  wear knee-high stockings since they may decrease blood flow to your legs.  Wear shoes that fit properly and have enough cushioning. To break in new shoes, wear them for just a few hours a day. This prevents you from injuring your feet. Always look in your shoes before you put them on to be sure there are no objects inside.  Do not cross your legs. This may decrease the blood flow to your feet.  If you find a minor scrape, cut, or break in the skin on your feet, keep it and the skin around it clean and dry. These areas may be cleansed with mild soap and water. Do not cleanse the area with peroxide, alcohol, or iodine.  When you remove an adhesive bandage, be sure not to damage the skin around it.  If you have a wound, look at it several times a day to make sure it is healing.  Do not use heating pads or hot water bottles. They may burn your skin. If you have lost feeling in your feet or legs, you may not know it is happening until it is too late.  Make sure your health care provider performs a complete foot exam at least annually or more often if you have foot problems. Report any cuts, sores, or bruises to your health care provider immediately. Contact a health care provider if:  You have an injury that is not healing.  You have cuts or breaks in the skin.  You have an ingrown nail.  You notice redness on your legs or feet.  You feel burning or tingling in your legs or feet.  You have pain or cramps in your legs and feet.  Your legs or feet are numb.  Your feet always feel cold. Get help right away if:  There is increasing redness, swelling, or pain in or around a wound.  There is a red line that goes up your leg.  Pus is coming from a wound.  You develop a fever or as directed by your health care provider.  You notice a bad smell coming from an ulcer or wound. This information is not intended to replace advice given to you by your health care provider. Make sure you  discuss any questions you have with your health care provider. Document Released: 06/18/2000 Document Revised: 11/27/2015 Document Reviewed: 11/28/2012 Elsevier Interactive Patient Education  2017 [employer].

## 2016-12-15 NOTE — Progress Notes (Signed)
Patient ID: Joanne Carlson, female   DOB: [birth year], 71 y.o.   MRN: [id number]   Subjective: Patient essentially a follow-up visit of 12/08/2016 for I&D of bulla with superficial ulceration. Patient currently taking cephalexin 500 mg by mouth twice a day without a complaint and soaking diluted Betadine. The bulla on the left hallux was drained on the visit of 12/08/2016 and cephalexin 500 mg by mouth twice a day was maintained as well as Betadine soaks. The blister fluid was submitted for culture and sensitivity. Patient says  foot is feeling better and has an occasional sharp pain in the left great toe area. Patient was initially evaluated by Dr. Jacelyn Grip approximately 12/05/2016 and cephalexin 500 mg by mouth twice a day was prescribed and patient was referred to our office. Patient is  diabetic with end-stage renal disease  Objective: Orientated 3 Temperature 97.6 Fahrenheit BP 144 4/58 Pulse 81 Residual low-grade edema dorsal left foot slightly proximal MPJ area DP pulses 2/4 bilaterally PT pulses trace palpable bilaterally Sensation to 10 g monofilament wire intact 4/5 right 3/5 left Vibratory sedation reactive bilaterally Ankle reflex reactive bilaterally Left hallux is low-grade edema Scaling medially on left hallux Plantar left hallux has a 5 x 10 mm superficial ulcer with a green/appearance after debridement the office a granular appearance with bleeding. No active drainage or malodor noted. The wound does not probe deep  Wound culture collected 12/08/2016 Normal skin flora Multiple organisms present none predominate No Staphylococcus aureus isolated No group a strep isolated  Assessment: Diabetic with peripheral arterial disease Reducing cellulitis left hallux Superficial ulcer plantar left hallux  Plan: Maintain cephalexin 500 mg by mouth twice a day Continued diluted Betadine soaks daily Apply triple antibiotic ointment to the skin ulcer daily Cover wounds with light  gauze dressing Continue wearing surgical shoe on left foot Patient instructed notice any sudden increase in pain, swelling, redness to present immediately to emergency room  Reappoint 7 days

## 2016-12-17 ENCOUNTER — Encounter: Payer: Self-pay | Admitting: Vascular Surgery

## 2016-12-17 ENCOUNTER — Ambulatory Visit (INDEPENDENT_AMBULATORY_CARE_PROVIDER_SITE_OTHER): Payer: Self-pay | Admitting: Vascular Surgery

## 2016-12-17 VITALS — BP 152/79 | HR 87 | Temp 100.2°F | Resp 20 | Ht 64.0 in | Wt 178.0 lb

## 2016-12-17 DIAGNOSIS — N186 End stage renal disease: Secondary | ICD-10-CM

## 2016-12-22 ENCOUNTER — Encounter: Payer: Self-pay | Admitting: Podiatry

## 2016-12-22 ENCOUNTER — Ambulatory Visit (INDEPENDENT_AMBULATORY_CARE_PROVIDER_SITE_OTHER): Payer: Medicare HMO | Admitting: Podiatry

## 2016-12-22 VITALS — BP 148/69 | HR 84 | Temp 97.6°F | Resp 18

## 2016-12-22 DIAGNOSIS — R0989 Other specified symptoms and signs involving the circulatory and respiratory systems: Secondary | ICD-10-CM

## 2016-12-22 DIAGNOSIS — L97521 Non-pressure chronic ulcer of other part of left foot limited to breakdown of skin: Secondary | ICD-10-CM

## 2016-12-22 NOTE — Patient Instructions (Signed)
Continued diluted Betadine soaks daily Continue applying topical antibiotic ointment and light gauze dressing daily to the right great toe Continue oral antibiotics one twice a day Continue wearing surgical shoe If you develop any sudden increase in pain, swelling, redness, fever presents to emergency department The vascular lab we'll contact you to schedule the circulation test  Diabetes and Foot Care Diabetes may cause you to have problems because of poor blood supply (circulation) to your feet and legs. This may cause the skin on your feet to become thinner, break easier, and heal more slowly. Your skin may become dry, and the skin may peel and crack. You may also have nerve damage in your legs and feet causing decreased feeling in them. You may not notice minor injuries to your feet that could lead to infections or more serious problems. Taking care of your feet is one of the most important things you can do for yourself. Follow these instructions at home:  Wear shoes at all times, even in the house. Do not go barefoot. Bare feet are easily injured.  Check your feet daily for blisters, cuts, and redness. If you cannot see the bottom of your feet, use a mirror or ask someone for help.  Wash your feet with warm water (do not use hot water) and mild soap. Then pat your feet and the areas between your toes until they are completely dry. Do not soak your feet as this can dry your skin.  Apply a moisturizing lotion or petroleum jelly (that does not contain alcohol and is unscented) to the skin on your feet and to dry, brittle toenails. Do not apply lotion between your toes.  Trim your toenails straight across. Do not dig under them or around the cuticle. File the edges of your nails with an emery board or nail file.  Do not cut corns or calluses or try to remove them with medicine.  Wear clean socks or stockings every day. Make sure they are not too tight. Do not wear knee-high stockings since  they may decrease blood flow to your legs.  Wear shoes that fit properly and have enough cushioning. To break in new shoes, wear them for just a few hours a day. This prevents you from injuring your feet. Always look in your shoes before you put them on to be sure there are no objects inside.  Do not cross your legs. This may decrease the blood flow to your feet.  If you find a minor scrape, cut, or break in the skin on your feet, keep it and the skin around it clean and dry. These areas may be cleansed with mild soap and water. Do not cleanse the area with peroxide, alcohol, or iodine.  When you remove an adhesive bandage, be sure not to damage the skin around it.  If you have a wound, look at it several times a day to make sure it is healing.  Do not use heating pads or hot water bottles. They may burn your skin. If you have lost feeling in your feet or legs, you may not know it is happening until it is too late.  Make sure your health care provider performs a complete foot exam at least annually or more often if you have foot problems. Report any cuts, sores, or bruises to your health care provider immediately. Contact a health care provider if:  You have an injury that is not healing.  You have cuts or breaks in the skin.  You have an ingrown nail.  You notice redness on your legs or feet.  You feel burning or tingling in your legs or feet.  You have pain or cramps in your legs and feet.  Your legs or feet are numb.  Your feet always feel cold. Get help right away if:  There is increasing redness, swelling, or pain in or around a wound.  There is a red line that goes up your leg.  Pus is coming from a wound.  You develop a fever or as directed by your health care provider.  You notice a bad smell coming from an ulcer or wound. This information is not intended to replace advice given to you by your health care provider. Make sure you discuss any questions you have with  your health care provider. Document Released: 06/18/2000 Document Revised: 11/27/2015 Document Reviewed: 11/28/2012 Elsevier Interactive Patient Education  2017 [employer].

## 2016-12-23 NOTE — Progress Notes (Signed)
Patient ID: Joanne Carlson, female   DOB: 10-09-1945, 71 y.o.   MRN: [id number]    Subjective: Patient essentially a follow-up visit of 12/08/2016 for I&D of bulla with superficial ulceration. Patient currently taking cephalexin 500 mg by mouth twice a day without a complaint and soaking diluted Betadine. The bulla on the left hallux was drained on the visit of 12/08/2016 and cephalexin 500 mg by mouth twice a day was maintained as well as Betadine soaks. The blister fluid was submitted for culture and sensitivity. Patient says  foot is feeling better and has an occasional sharp pain in the left great toe area. Patient was initially evaluated by Dr. Jacelyn Grip approximately 12/05/2016 and cephalexin 500 mg by mouth twice a day was prescribed and patient was referred to our office. Patient is currently taking cephalexin 500 mg by mouth twice a day without any difficulty. Also, doing diluted Betadine soaks and applying topical antibiotic ointment daily. Patient is  diabetic with end-stage renal disease  Objective: BP 140/69 Pulse 84 Respiration 18 Temperature 97.6 Fahrenheit Residual low-grade edema dorsal left foot DP pulses 2/4 bilaterally PT pulses trace palpable bilaterally Sensation to 10 g monofilament wire intact 4/5 right 3/5 left Vibratory sedation reactive bilaterally Ankle reflex reactive bilaterally Left hallux is low-grade edema Plantar left hallux has a superficial ulcer 3 mm in diameter with granular base surrounded by scaling skin. The wound does not probe deep The lateral aspect of the left hallux from the web space distally has a 15 x 20 mm superficial ulcer with a granular fibrous base. The wound does not probe deep   Wound culture collected 12/08/2016 Normal skin flora Multiple organisms present none predominate No Staphylococcus aureus isolated No group a strep isolated  X-ray examination left foot 12/08/2016  Intact bony structures fracture or dislocation Increased soft  tissue density left hallux No cortical disruption in the phalanges of the hallux Vascular calcification first intermetatarsal space Decreased bone density noted all views  Radiographic impression: Increased soft tissue density left hallux No x-ray sign of osteomyelitis left hallux  Assessment: Diabetic with peripheral arterial disease Reducing cellulitis left hallux Superficial ulcer plantar left hallux Superficial ulcer lateral left hallux  Plan: Maintain cephalexin 500 mg by mouth twice a day Maintain diluted Betadine soaks Maintain surgical shoe with limited walking Debride plantar ulcer and lateral ulcer sharply with application of topical antibiotic ointment and gauze dressing Patient referred to vascular lab for lower extremity arterial Doppler for the indication of diabetic wound with decreased pedal pulses Patient instructed if she knows any sudden increase in pain, swelling, redness, fever to present immediately to the emergency department  Reevaluate 7 days

## 2016-12-29 ENCOUNTER — Telehealth: Payer: Self-pay | Admitting: *Deleted

## 2016-12-29 ENCOUNTER — Encounter: Payer: Self-pay | Admitting: Podiatry

## 2016-12-29 ENCOUNTER — Ambulatory Visit (INDEPENDENT_AMBULATORY_CARE_PROVIDER_SITE_OTHER): Payer: Medicare HMO | Admitting: Podiatry

## 2016-12-29 VITALS — BP 121/48 | HR 93 | Temp 97.3°F | Resp 18

## 2016-12-29 DIAGNOSIS — R0989 Other specified symptoms and signs involving the circulatory and respiratory systems: Secondary | ICD-10-CM

## 2016-12-29 DIAGNOSIS — L97521 Non-pressure chronic ulcer of other part of left foot limited to breakdown of skin: Secondary | ICD-10-CM

## 2016-12-29 NOTE — Patient Instructions (Signed)
Complete all remaining antibiotics and do not refill unless you notice any sudden increase of swelling, redness, drainage. Maintain diluted Betadine soaks daily for 2-4 minutes Apply Silvadene cream to the skin ulcer on the right great toe daily If you notice any sudden increase in pain, swelling, redness, fever present to the emergency department Our offices contacting the vascular lab for circulation test and the vascular lab should contact you directly  Diabetes and Foot Care Diabetes may cause you to have problems because of poor blood supply (circulation) to your feet and legs. This may cause the skin on your feet to become thinner, break easier, and heal more slowly. Your skin may become dry, and the skin may peel and crack. You may also have nerve damage in your legs and feet causing decreased feeling in them. You may not notice minor injuries to your feet that could lead to infections or more serious problems. Taking care of your feet is one of the most important things you can do for yourself. Follow these instructions at home:  Wear shoes at all times, even in the house. Do not go barefoot. Bare feet are easily injured.  Check your feet daily for blisters, cuts, and redness. If you cannot see the bottom of your feet, use a mirror or ask someone for help.  Wash your feet with warm water (do not use hot water) and mild soap. Then pat your feet and the areas between your toes until they are completely dry. Do not soak your feet as this can dry your skin.  Apply a moisturizing lotion or petroleum jelly (that does not contain alcohol and is unscented) to the skin on your feet and to dry, brittle toenails. Do not apply lotion between your toes.  Trim your toenails straight across. Do not dig under them or around the cuticle. File the edges of your nails with an emery board or nail file.  Do not cut corns or calluses or try to remove them with medicine.  Wear clean socks or stockings every  day. Make sure they are not too tight. Do not wear knee-high stockings since they may decrease blood flow to your legs.  Wear shoes that fit properly and have enough cushioning. To break in new shoes, wear them for just a few hours a day. This prevents you from injuring your feet. Always look in your shoes before you put them on to be sure there are no objects inside.  Do not cross your legs. This may decrease the blood flow to your feet.  If you find a minor scrape, cut, or break in the skin on your feet, keep it and the skin around it clean and dry. These areas may be cleansed with mild soap and water. Do not cleanse the area with peroxide, alcohol, or iodine.  When you remove an adhesive bandage, be sure not to damage the skin around it.  If you have a wound, look at it several times a day to make sure it is healing.  Do not use heating pads or hot water bottles. They may burn your skin. If you have lost feeling in your feet or legs, you may not know it is happening until it is too late.  Make sure your health care provider performs a complete foot exam at least annually or more often if you have foot problems. Report any cuts, sores, or bruises to your health care provider immediately. Contact a health care provider if:  You have an  injury that is not healing.  You have cuts or breaks in the skin.  You have an ingrown nail.  You notice redness on your legs or feet.  You feel burning or tingling in your legs or feet.  You have pain or cramps in your legs and feet.  Your legs or feet are numb.  Your feet always feel cold. Get help right away if:  There is increasing redness, swelling, or pain in or around a wound.  There is a red line that goes up your leg.  Pus is coming from a wound.  You develop a fever or as directed by your health care provider.  You notice a bad smell coming from an ulcer or wound. This information is not intended to replace advice given to you by  your health care provider. Make sure you discuss any questions you have with your health care provider. Document Released: 06/18/2000 Document Revised: 11/27/2015 Document Reviewed: 11/28/2012 Elsevier Interactive Patient Education  2017 [employer].

## 2016-12-29 NOTE — Progress Notes (Signed)
Patient ID: Joanne Carlson, female   DOB: [birth year], 71 y.o.   MRN: [id number]    Subjective: Patient essentially a follow-up visit of 12/08/2016 for I&D of bulla with superficial ulceration. Patient currently taking cephalexin 500 mg by mouth twice a day without a complaint and soaking diluted Betadine.The bulla on the left hallux was drained on the visit of 12/08/2016 and cephalexin 500 mg by mouth twice a day (with 2 doses remaining/without any complaint)  as well as Betadine soaks. The blister fluid was submitted for culture and sensitivity. Patient says foot is feeling better and has an occasional sharp pain in the left great toe area.Patient was initially evaluated by Dr. Jacelyn Grip approximately 12/05/2016 and cephalexin 500 mg by mouth twice a day was prescribed and patient was referred to our office. Patient is currently taking cephalexin 500 mg by mouth twice a day without any difficulty. Also, doing diluted Betadine soaks and applying topical antibiotic ointment daily. Patient is diabetic with end-stage renal disease. Vascular lab is not oct[facility] patient for lower extremity arterial Doppler  Temperature 97.68Fahrenheit Residual low-grade edema dorsal left foot, resolved DP pulses 2/4 bilaterally PT pulses trace palpable bilaterally Sensation to 10 g monofilament wire intact 4/5 right 3/5 left Vibratory sedation reactive bilaterally Ankle reflex reactive bilaterally Left hallux is low-grade edema Plantar left hallux has a superficial ulcer 3 mm in diameter with granular base surrounded by scaling skin. The wound does not probe deep. After debridement wound maintains granular base, pre-ulcerative The lateral aspect of the left hallux from the web space distally has a 15 x 20 mm superficial ulcer with a granular fibrous base. The wound does not probe deep. There is some fibrous tissue on the distal periphery approximately 5 mm which were sharply debrided today with bleeding.   Wound  culture collected 12/08/2016 Normal skin flora Multiple organisms present none predominate No Staphylococcus aureus isolated No group a strep isolated  X-ray examination left foot 12/08/2016  Intact bony structures fracture or dislocation Increased soft tissue density left hallux No cortical disruption in the phalanges of the hallux Vascular calcification first intermetatarsal space Decreased bone density noted all views  Radiographic impression: Increased soft tissue density left hallux No x-ray sign of osteomyelitis left hallux  Assessment: Diabetic with peripheral arterial disease Resolved cellulitis left hallux Superficial ulcer plantar left hallux Superficial ulcer lateral left hallux  Plan: Maintain cephalexin 500 mg by mouth twice a day until gone Maintain diluted Betadine soaks Maintain surgical shoe with limited walking Debride plantar ulcer and lateral ulcer sharply with application of topical antibiotic ointment and gauze dressing Patient referred to vascular lab for lower extremity arterial Doppler for the indication of diabetic wound with decreased pedal pulses (second request for this vascular examination) Patient instructed if she knows any sudden increase in pain, swelling, redness, fever to present immediately to the emergency department  Reevaluate 7 days

## 2016-12-29 NOTE — Telephone Encounter (Signed)
Faxed orders for ABI with TBI and arterial dopplers to [facility].

## 2017-01-03 ENCOUNTER — Ambulatory Visit (HOSPITAL_COMMUNITY)
Admission: RE | Admit: 2017-01-03 | Discharge: 2017-01-03 | Disposition: A | Discharge status: Home / Self Care | Payer: Medicare HMO | Source: Ambulatory Visit | Attending: Cardiology | Admitting: Cardiology

## 2017-01-03 ENCOUNTER — Telehealth: Payer: Self-pay

## 2017-01-03 ENCOUNTER — Telehealth: Payer: Self-pay | Admitting: Podiatry

## 2017-01-03 DIAGNOSIS — R938 Abnormal findings on diagnostic imaging of other specified body structures: Secondary | ICD-10-CM | POA: Insufficient documentation

## 2017-01-03 DIAGNOSIS — I10 Essential (primary) hypertension: Secondary | ICD-10-CM | POA: Insufficient documentation

## 2017-01-03 DIAGNOSIS — R0989 Other specified symptoms and signs involving the circulatory and respiratory systems: Secondary | ICD-10-CM

## 2017-01-03 DIAGNOSIS — I739 Peripheral vascular disease, unspecified: Secondary | ICD-10-CM

## 2017-01-03 DIAGNOSIS — E785 Hyperlipidemia, unspecified: Secondary | ICD-10-CM | POA: Insufficient documentation

## 2017-01-03 DIAGNOSIS — E119 Type 2 diabetes mellitus without complications: Secondary | ICD-10-CM | POA: Diagnosis not present

## 2017-01-03 DIAGNOSIS — Z87891 Personal history of nicotine dependence: Secondary | ICD-10-CM | POA: Diagnosis not present

## 2017-01-03 DIAGNOSIS — I700 Atherosclerosis of aorta: Secondary | ICD-10-CM | POA: Diagnosis not present

## 2017-01-03 DIAGNOSIS — I771 Stricture of artery: Secondary | ICD-10-CM | POA: Diagnosis not present

## 2017-01-03 DIAGNOSIS — I708 Atherosclerosis of other arteries: Secondary | ICD-10-CM | POA: Diagnosis not present

## 2017-01-03 NOTE — Telephone Encounter (Signed)
Pt here for VAS [place] she states that she is feeling dizzy and a little lightheaded when trying to stand. Tech states that her BP before scan was 142/80's She further states that she feels this way when she has dialysis also, she states that she has not eaten anything today. BP sitting 132/73 HR 77; standing 139/73 HR 81 pt given 8oz of water and juice. After drinking she states that she feels much better and left facility on own accord without incident

## 2017-01-03 NOTE — Telephone Encounter (Signed)
S.w pt she states that she had one episode of dizziness upon standing since she returned home, she states that she . Just wanted to let you know.  Per earlier today: Pt here for VAS [place] she states that she is feeling dizzy and a little lightheaded when trying to stand. Tech states that her BP before scan was 142/80's She further states that she feels this way when she has dialysis also, she states that she has not eaten anything today. BP sitting 132/73 HR 77; standing 139/73 HR 81 pt given 8oz of water and juice. After drinking she states that she feels much better and left facility on own accord without incident

## 2017-01-03 NOTE — Telephone Encounter (Signed)
New message     Pt is returning a call from nurse

## 2017-01-04 ENCOUNTER — Ambulatory Visit (INDEPENDENT_AMBULATORY_CARE_PROVIDER_SITE_OTHER): Payer: Medicare HMO | Admitting: Podiatry

## 2017-01-04 ENCOUNTER — Encounter: Payer: Self-pay | Admitting: Podiatry

## 2017-01-04 VITALS — BP 139/113 | HR 92 | Resp 18

## 2017-01-04 DIAGNOSIS — L97521 Non-pressure chronic ulcer of other part of left foot limited to breakdown of skin: Secondary | ICD-10-CM | POA: Diagnosis not present

## 2017-01-04 NOTE — Patient Instructions (Signed)
Continue to apply topical antibiotic ointment to wounds daily and cover with gauze on the left great toe Continue wearing surgical shoe on left foot Keep your scheduled point with Dr. Gwenlyn Found on 01/07/2017 If you develop any sudden increase in pain, swelling, redness, fever present to the emergency department  Diabetes and Foot Care Diabetes may cause you to have problems because of poor blood supply (circulation) to your feet and legs. This may cause the skin on your feet to become thinner, break easier, and heal more slowly. Your skin may become dry, and the skin may peel and crack. You may also have nerve damage in your legs and feet causing decreased feeling in them. You may not notice minor injuries to your feet that could lead to infections or more serious problems. Taking care of your feet is one of the most important things you can do for yourself. Follow these instructions at home:  Wear shoes at all times, even in the house. Do not go barefoot. Bare feet are easily injured.  Check your feet daily for blisters, cuts, and redness. If you cannot see the bottom of your feet, use a mirror or ask someone for help.  Wash your feet with warm water (do not use hot water) and mild soap. Then pat your feet and the areas between your toes until they are completely dry. Do not soak your feet as this can dry your skin.  Apply a moisturizing lotion or petroleum jelly (that does not contain alcohol and is unscented) to the skin on your feet and to dry, brittle toenails. Do not apply lotion between your toes.  Trim your toenails straight across. Do not dig under them or around the cuticle. File the edges of your nails with an emery board or nail file.  Do not cut corns or calluses or try to remove them with medicine.  Wear clean socks or stockings every day. Make sure they are not too tight. Do not wear knee-high stockings since they may decrease blood flow to your legs.  Wear shoes that fit properly  and have enough cushioning. To break in new shoes, wear them for just a few hours a day. This prevents you from injuring your feet. Always look in your shoes before you put them on to be sure there are no objects inside.  Do not cross your legs. This may decrease the blood flow to your feet.  If you find a minor scrape, cut, or break in the skin on your feet, keep it and the skin around it clean and dry. These areas may be cleansed with mild soap and water. Do not cleanse the area with peroxide, alcohol, or iodine.  When you remove an adhesive bandage, be sure not to damage the skin around it.  If you have a wound, look at it several times a day to make sure it is healing.  Do not use heating pads or hot water bottles. They may burn your skin. If you have lost feeling in your feet or legs, you may not know it is happening until it is too late.  Make sure your health care provider performs a complete foot exam at least annually or more often if you have foot problems. Report any cuts, sores, or bruises to your health care provider immediately. Contact a health care provider if:  You have an injury that is not healing.  You have cuts or breaks in the skin.  You have an ingrown nail.  You  notice redness on your legs or feet.  You feel burning or tingling in your legs or feet.  You have pain or cramps in your legs and feet.  Your legs or feet are numb.  Your feet always feel cold. Get help right away if:  There is increasing redness, swelling, or pain in or around a wound.  There is a red line that goes up your leg.  Pus is coming from a wound.  You develop a fever or as directed by your health care provider.  You notice a bad smell coming from an ulcer or wound. This information is not intended to replace advice given to you by your health care provider. Make sure you discuss any questions you have with your health care provider. Document Released: 06/18/2000 Document Revised:  11/27/2015 Document Reviewed: 11/28/2012 Elsevier Interactive Patient Education  2017 [employer].

## 2017-01-05 NOTE — Progress Notes (Signed)
Patient ID: Joanne Carlson, female   DOB: 09/30/1945, 71 y.o.   MRN: [id number]    Subjective:  This patient presents for follow-up care initiated on 12/08/2016. Patient has undergone lower extremity arterial Doppler with results not visible on EMR today. Patient has been instructed for follow-up based on lower extremity arterial Doppler with Dr. Gwenlyn Found on 01/07/2017.  Patient essentially a follow-up visit of 12/08/2016 for I&D of bulla with superficial ulceration. Patient currently taking cephalexin 500 mg by mouth twice a day without a complaint and soaking diluted Betadine.The bulla on the left hallux was drained on the visit of 12/08/2016 and cephalexin 500 mg by mouth twice a day (with 2 doses remaining/without any complaint)  as well as Betadine soaks. The blister fluid was submitted for culture and sensitivity. Patient says foot is feeling better and has an occasional sharp pain in the left great toe area.Patient was initially evaluated by Dr. Jacelyn Grip approximately 12/05/2016 and cephalexin 500 mg by mouth twice a day was prescribed and patient was referred to our office.Patient has completed all cephalexin 500 mg by mouth twice a day without any difficulty. Also, doing diluted Betadine soaks and applying topical antibiotic ointment daily. Patient is diabetic with end-stage renal disease.  Objective: Patient appears orientated 3 in no apparent pain  Wound culture collected 12/08/2016 Normal skin flora Multiple organisms present none predominate No Staphylococcus aureus isolated No group a strep isolated  X-ray examination left foot 12/08/2016  Intact bony structures fracture or dislocation Increased soft tissue density left hallux No cortical disruption in the phalanges of the hallux Vascular calcification first intermetatarsal space Decreased bone density noted all views  Radiographic impression: Increased soft tissue density left hallux No x-ray sign of osteomyelitis left  hallux  DP pulses 2/4 bilaterally PT pulses trace palpable bilaterally Sensation to 10 g monofilament wire intact 4/5 right 3/5 left Vibratory sedation reactive bilaterally Ankle reflex reactive bilaterally Left hallux is low-grade edema No edema dorsal left foot Plantar left hallux has a superficial ulcer 2 mm in diameter with granular base surrounded by scaling skin.The wound does not probe deep. After debridement wound maintains granular base. There is no drainage or erythema noted in the plantar wound The lateral aspect of the left hallux from the web space distally has a 15 x 10 mm superficial ulcer with a granular fibrous base. The wound does not probe deep. There is some fibrous tissue on the distal periphery approximately 5 mm which were sharply debrided today with bleeding.  Assessment: Diabetic with peripheral arterial disease with pending vascular follow-up with Dr. Gwenlyn Found The wounds are improving with local wound care No clinical sign of infection at this time in the wounds the left hallux  Plan: Debride lateral and plantar wounds and apply topical antibiotic ointment dressings Maintain diluted Betadine soaks if drainage is noted DC cephalexin Patient will maintain local wound care with application of topical antibiotic ointment and light gauze dressing Maintain surgical shoe left Limit standing walking Instructed patient if notice any sudden increase in pain, swelling, redness, fever present to the emergency department  Reappoint 7 day

## 2017-01-07 ENCOUNTER — Ambulatory Visit (INDEPENDENT_AMBULATORY_CARE_PROVIDER_SITE_OTHER): Payer: Medicare HMO | Admitting: Cardiovascular Disease

## 2017-01-07 ENCOUNTER — Encounter: Payer: Self-pay | Admitting: Cardiovascular Disease

## 2017-01-07 VITALS — BP 132/75 | HR 84 | Ht 65.0 in | Wt 175.0 lb

## 2017-01-07 DIAGNOSIS — E7800 Pure hypercholesterolemia, unspecified: Secondary | ICD-10-CM

## 2017-01-07 DIAGNOSIS — I10 Essential (primary) hypertension: Secondary | ICD-10-CM | POA: Diagnosis not present

## 2017-01-07 DIAGNOSIS — I70229 Atherosclerosis of native arteries of extremities with rest pain, unspecified extremity: Secondary | ICD-10-CM | POA: Insufficient documentation

## 2017-01-07 DIAGNOSIS — R0989 Other specified symptoms and signs involving the circulatory and respiratory systems: Secondary | ICD-10-CM | POA: Diagnosis not present

## 2017-01-07 DIAGNOSIS — I998 Other disorder of circulatory system: Secondary | ICD-10-CM

## 2017-01-07 NOTE — Assessment & Plan Note (Signed)
History of essential hypertension blood pressure measured at 132/75. She is on Monopril. Continue current meds at current dosing

## 2017-01-07 NOTE — Progress Notes (Signed)
01/07/2017 Joanne Carlson   June 02, 1946  [id number]  Primary Physician Vernie Shanks, MD Primary Cardiologist: Lorretta Harp MD Renae Gloss  HPI:  Joanne Carlson is a 71 year old widowed African-American female mother of 2 children referred by Dr. Amalia Hailey for peripheral vascular evaluation because of critical limb ischemia. She has a history of treated hypertension, diabetes and hyperlipidemia. 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 Dr. Adele Barthel placed an AV fistula. She's never had a heart attack or stroke. She denies chest pain or shortness of breath. She's had a small non-healing ulcer on her left great toe treated with antibiotics recently and according to her this is slowly healing. Lower extremity arterial Doppler studies performed in our office 01/03/17 revealed ABIs approximately 0.8 bilaterally with an occluded distal right SFA and high-grade left SFA stenosis with three-vessel runoff.   Current Outpatient Prescriptions  Medication Sig Dispense Refill  . allopurinol (ZYLOPRIM) 100 MG tablet Take 200 mg by mouth daily.     Marland Kitchen aspirin EC 81 MG tablet Take 81 mg by mouth daily.    . calcium acetate (PHOSLO) 667 MG capsule Take 667-1,334 mg by mouth See admin instructions. Take 2 caps three times a day with meals. Take 1 cap with snacks twice daily.    . cinacalcet (SENSIPAR) 30 MG tablet Take 30 mg by mouth daily.    . fosinopril (MONOPRIL) 40 MG tablet Take 40 mg by mouth 2 (two) times daily.    Marland Kitchen gabapentin (NEURONTIN) 100 MG capsule Take 1 capsule by mouth every morning and 1 capsule at bedtime. PATIENT NEEDS OFFICE VISIT FOR ADDITIONAL REFILLS (Patient taking differently: Take 100 mg by mouth daily as needed (pain). ) 60 capsule 0  . glucose blood test strip Use to test blood sugar daily. Dx code: 250.02. 100 each 3  . Lancets MISC Use to test blood sugar daily. Dx code 250.02. 100 each 3  . oxyCODONE (ROXICODONE) 5 MG immediate  release tablet Take 1 tablet (5 mg total) by mouth every 6 (six) hours as needed. 10 tablet 0  . pravastatin (PRAVACHOL) 20 MG tablet Take 20 mg by mouth at bedtime.      No current facility-administered medications for this visit.     Allergies  Allergen Reactions  . Tylenol [Acetaminophen] Rash    Social History   Social History  . Marital status: Widowed    Spouse name: N/A  . Number of children: N/A  . Years of education: N/A   Occupational History  . Not on file.   Social History Main Topics  . Smoking status: Former Smoker    Years: 10.00    Types: Cigarettes  . Smokeless tobacco: Never Used     Comment: quit smoking over 35+yrs ago  . Alcohol use No  . Drug use: No  . Sexual activity: No   Other Topics Concern  . Not on file   Social History Narrative  . No narrative on file     Review of Systems: General: negative for chills, fever, night sweats or weight changes.  Cardiovascular: negative for chest pain, dyspnea on exertion, edema, orthopnea, palpitations, paroxysmal nocturnal dyspnea or shortness of breath Dermatological: negative for rash Respiratory: negative for cough or wheezing Urologic: negative for hematuria Abdominal: negative for nausea, vomiting, diarrhea, bright red blood per rectum, melena, or hematemesis Neurologic: negative for visual changes, syncope, or dizziness All other systems reviewed and are otherwise negative except  as noted above.    Blood pressure 132/75, pulse 84, height 5\' 5"  (1.651 m), weight 175 lb (79.4 kg).  General appearance: alert and no distress Neck: no adenopathy, no JVD, supple, symmetrical, trachea midline, thyroid not enlarged, symmetric, no tenderness/mass/nodules and Soft bilateral carotid bruits Lungs: clear to auscultation bilaterally Heart: regular rate and rhythm, S1, S2 normal, no murmur, click, rub or gallop Extremities: Darkish discoloration of left great toe with a small punctate ulcer and palpable  pedal pulses.  EKG not performed today.  ASSESSMENT AND PLAN:   Critical lower limb ischemia Joanne Pallett was referred by Dr. Amalia Hailey for evaluation of critical limb ischemia. She has a history of treated hypertension, hyperlipidemia and diabetes. 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 An AV fistula was placed by Dr. Adele Barthel. She's had a small ulcer on her left great toe which apparently is slowly healing. She has been given antibiotics for this. Recent Dopplers in our office performed 01/03/17 revealed ABIs of approximately 0.8 bilaterally with an occluded distal right SFA and high-grade distal left SFA stenosis with three-vessel runoff. I can palpate pedal pulses on the left. The patient denies claudication. At this time, given the fact that she says the wound is slowly healing, and the fact that she has three-vessel runoff with palpable pedal pulses we will continue to watch her conservatively. I'll see her back in 4-6 weeks. Should her wound become worse over that time. We will consider angiography and endovascular therapy.  HYPERCHOLESTEROLEMIA History of hyperlipidemia on statin therapy followed by her PCP  HYPERTENSION, BENIGN History of essential hypertension blood pressure measured at 132/75. She is on Monopril. Continue current meds at current dosing      Lorretta Harp MD [facility], [facility] 01/07/2017 4:27 PM

## 2017-01-07 NOTE — Assessment & Plan Note (Signed)
Joanne Carlson was referred by Dr. Amalia Hailey for evaluation of critical limb ischemia. She has a history of treated hypertension, hyperlipidemia and diabetes. 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 An AV fistula was placed by Dr. Adele Barthel. She's had a small ulcer on her left great toe which apparently is slowly healing. She has been given antibiotics for this. Recent Dopplers in our office performed 01/03/17 revealed ABIs of approximately 0.8 bilaterally with an occluded distal right SFA and high-grade distal left SFA stenosis with three-vessel runoff. I can palpate pedal pulses on the left. The patient denies claudication. At this time, given the fact that she says the wound is slowly healing, and the fact that she has three-vessel runoff with palpable pedal pulses we will continue to watch her conservatively. I'll see her back in 4-6 weeks. Should her wound become worse over that time. We will consider angiography and endovascular therapy.

## 2017-01-07 NOTE — Assessment & Plan Note (Signed)
History of hyperlipidemia on statin therapy followed by her PCP. 

## 2017-01-07 NOTE — Patient Instructions (Signed)
Medication Instructions: Your physician recommends that you continue on your current medications as directed. Please refer to the Current Medication list given to you today.  Testing/Procedures: Your physician has requested that you have a carotid duplex. This test is an ultrasound of the carotid arteries in your neck. It looks at blood flow through these arteries that supply the brain with blood. Allow one hour for this exam. There are no restrictions or special instructions.  Follow-Up: Your physician recommends that you schedule a follow-up appointment in: 6 weeks with Dr. Gwenlyn Found.  If you need a refill on your cardiac medications before your next appointment, please call your pharmacy.

## 2017-01-11 NOTE — Telephone Encounter (Addendum)
-----   Message from Gean Birchwood, DPM sent at 01/11/2017  7:51 AM EDT ----- Refer patient to Dr. Gwenlyn Found for follow on this doppler and contact patient  Impressions Waveforms suggest bilateral femoral artery disease. Right ABI is in the mild range of flow reduction. Left ABI is in the moderate range of flow reduction. Abnormal great toe-brachial indices, bilaterally. PPG's of all ten toes are abnormal. Left message informing pt Dr. Amalia Hailey had reviewed her circulation test and to call for results and instructions.01/14/2017-Left message informing pt this was the 2nd message to her and I felt it was important she receive it, I informed pt of Dr. Phoebe Perch review or results and referral to [facility] DR. Gwenlyn Found [phone], and she would be receiving a call from their office for an appt to be evaluated by Dr. Gwenlyn Found. Pt called states she has an appt with Dr. Gwenlyn Found 01/19/2017. I told her I would fax our clinicals to Dr. Gwenlyn Found. Faxed referral, clinicals and demographics to [facility].

## 2017-01-12 ENCOUNTER — Ambulatory Visit: Payer: Medicare HMO | Admitting: Podiatry

## 2017-01-19 ENCOUNTER — Ambulatory Visit (HOSPITAL_COMMUNITY)
Admission: RE | Admit: 2017-01-19 | Discharge: 2017-01-19 | Disposition: A | Discharge status: Home / Self Care | Payer: Medicare HMO | Source: Ambulatory Visit | Attending: Cardiology | Admitting: Cardiology

## 2017-01-19 DIAGNOSIS — E1151 Type 2 diabetes mellitus with diabetic peripheral angiopathy without gangrene: Secondary | ICD-10-CM | POA: Insufficient documentation

## 2017-01-19 DIAGNOSIS — Z8673 Personal history of transient ischemic attack (TIA), and cerebral infarction without residual deficits: Secondary | ICD-10-CM | POA: Insufficient documentation

## 2017-01-19 DIAGNOSIS — R0989 Other specified symptoms and signs involving the circulatory and respiratory systems: Secondary | ICD-10-CM

## 2017-01-19 DIAGNOSIS — Z87891 Personal history of nicotine dependence: Secondary | ICD-10-CM | POA: Diagnosis not present

## 2017-01-19 DIAGNOSIS — I10 Essential (primary) hypertension: Secondary | ICD-10-CM | POA: Diagnosis not present

## 2017-01-19 DIAGNOSIS — E785 Hyperlipidemia, unspecified: Secondary | ICD-10-CM | POA: Insufficient documentation

## 2017-01-19 DIAGNOSIS — I6523 Occlusion and stenosis of bilateral carotid arteries: Secondary | ICD-10-CM | POA: Insufficient documentation

## 2017-01-26 ENCOUNTER — Ambulatory Visit (INDEPENDENT_AMBULATORY_CARE_PROVIDER_SITE_OTHER): Payer: Medicare HMO | Admitting: Podiatry

## 2017-01-26 ENCOUNTER — Encounter: Payer: Self-pay | Admitting: Podiatry

## 2017-01-26 VITALS — BP 141/68 | HR 83 | Temp 98.0°F | Resp 18

## 2017-01-26 DIAGNOSIS — L97521 Non-pressure chronic ulcer of other part of left foot limited to breakdown of skin: Secondary | ICD-10-CM

## 2017-01-26 NOTE — Progress Notes (Signed)
Patient ID: Joanne Carlson, female   DOB: January 29, 1946, 71 y.o.   MRN: [id number]        This patient presents for follow-up care initiated on 12/08/2016. Patient has undergone lower extremity arterial Doppler with results not visible on EMR today. Patient has been instructed for follow-up based on lower extremity arterial Doppler with Dr. Gwenlyn Found on 01/07/2017.  Patient essentially a follow-up visit of 12/08/2016 for I&D of bulla with superficial ulceration. Patient currently taking cephalexin 500 mg by mouth twice a day without a complaint and soaking diluted Betadine.The bulla on the left hallux was drained on the visit of 12/08/2016 and cephalexin 500 mg by mouth twice a day (with 2 doses remaining/without any complaint)as well as Betadine soaks. The blister fluid was submitted for culture and sensitivity. Patient says foot is feeling better and has an occasional sharp pain in the left great toe area.Patient was initially evaluated by Dr. Jacelyn Grip approximately 12/05/2016 and cephalexin 500 mg by mouth twice a day was prescribed and patient was referred to our office.Patient has completed all cephalexin 500 mg by mouth twice a day without any difficulty. Also, doing diluted Betadine soaks and applying topical antibiotic ointment daily. Patient is diabetic with end-stage renal disease.  Objective: Patient appears orientated 3 in no apparent pain  Wound culture collected 12/08/2016 Normal skin flora Multiple organisms present none predominate No Staphylococcus aureus isolated No group a strep isolated  X-ray examination left foot 12/08/2016  Intact bony structures fracture or dislocation Increased soft tissue density left hallux No cortical disruption in the phalanges of the hallux Vascular calcification first intermetatarsal space Decreased bone density noted all views  Radiographic impression: Increased soft tissue density left hallux No x-ray sign of osteomyelitis left  hallux  Objective: Patient not wearing surgical shoe as per instruction DP pulses 2/4 bilaterally PT pulses trace palpable bilaterally Sensation to 10 g monofilament wire intact 4/5 right 3/5 left Vibratory sedation reactive bilaterally Ankle reflex reactive bilaterally Left hallux is low-grade edema No edema dorsal left foot Plantar left hallux has a superficial ulcer 5 mm in diameter with granular base surrounded by scaling skin.The wound does not probe deep.After debridement wound maintains granular base. There is no drainage or erythema noted in the plantar wound The lateral aspect of the left hallux from the web space distally has a 5 x 3mm superficial ulcer with a granular fibrous base. The wound does not probe deep.  Results of lower extremity arterial Doppler reviewed: Dr. Gwenlyn Found states that is wounds are in the healing process he did not perform an intervention at this time   Assessment: Diabetic with peripheral arterial disease vascular follow-up with Dr. Gwenlyn Found The wounds are improving with local wound care No clinical sign of infection at this time in the wounds the left hallux  Plan: Debride lateral and plantar wounds and apply topical antibiotic ointment dressings Maintain diluted Betadine soaks if drainage is noted DC cephalexin Patient will maintain local wound care with application of topical antibiotic ointment and light gauze dressing Maintain surgical shoe left Limit standing walking Instructed patient if notice any sudden increase in pain, swelling, redness, fever present to the emergency department  Reappoint 7 day

## 2017-01-26 NOTE — Patient Instructions (Signed)
Maintain diluted Betadine soaks daily Apply Silvadene cream to the skin to skin ulcers on your left great toe daily and cover with gauze Continue to wear the surgical shoe on your left foot You notice any sudden increase in pain, swelling, redness, fever present to the emergency department  Diabetes and Foot Care Diabetes may cause you to have problems because of poor blood supply (circulation) to your feet and legs. This may cause the skin on your feet to become thinner, break easier, and heal more slowly. Your skin may become dry, and the skin may peel and crack. You may also have nerve damage in your legs and feet causing decreased feeling in them. You may not notice minor injuries to your feet that could lead to infections or more serious problems. Taking care of your feet is one of the most important things you can do for yourself. Follow these instructions at home:  Wear shoes at all times, even in the house. Do not go barefoot. Bare feet are easily injured.  Check your feet daily for blisters, cuts, and redness. If you cannot see the bottom of your feet, use a mirror or ask someone for help.  Wash your feet with warm water (do not use hot water) and mild soap. Then pat your feet and the areas between your toes until they are completely dry. Do not soak your feet as this can dry your skin.  Apply a moisturizing lotion or petroleum jelly (that does not contain alcohol and is unscented) to the skin on your feet and to dry, brittle toenails. Do not apply lotion between your toes.  Trim your toenails straight across. Do not dig under them or around the cuticle. File the edges of your nails with an emery board or nail file.  Do not cut corns or calluses or try to remove them with medicine.  Wear clean socks or stockings every day. Make sure they are not too tight. Do not wear knee-high stockings since they may decrease blood flow to your legs.  Wear shoes that fit properly and have enough  cushioning. To break in new shoes, wear them for just a few hours a day. This prevents you from injuring your feet. Always look in your shoes before you put them on to be sure there are no objects inside.  Do not cross your legs. This may decrease the blood flow to your feet.  If you find a minor scrape, cut, or break in the skin on your feet, keep it and the skin around it clean and dry. These areas may be cleansed with mild soap and water. Do not cleanse the area with peroxide, alcohol, or iodine.  When you remove an adhesive bandage, be sure not to damage the skin around it.  If you have a wound, look at it several times a day to make sure it is healing.  Do not use heating pads or hot water bottles. They may burn your skin. If you have lost feeling in your feet or legs, you may not know it is happening until it is too late.  Make sure your health care provider performs a complete foot exam at least annually or more often if you have foot problems. Report any cuts, sores, or bruises to your health care provider immediately. Contact a health care provider if:  You have an injury that is not healing.  You have cuts or breaks in the skin.  You have an ingrown nail.  You notice  redness on your legs or feet.  You feel burning or tingling in your legs or feet.  You have pain or cramps in your legs and feet.  Your legs or feet are numb.  Your feet always feel cold. Get help right away if:  There is increasing redness, swelling, or pain in or around a wound.  There is a red line that goes up your leg.  Pus is coming from a wound.  You develop a fever or as directed by your health care provider.  You notice a bad smell coming from an ulcer or wound. This information is not intended to replace advice given to you by your health care provider. Make sure you discuss any questions you have with your health care provider. Document Released: 06/18/2000 Document Revised: 11/27/2015  Document Reviewed: 11/28/2012 Elsevier Interactive Patient Education  2017 [employer].

## 2017-02-01 ENCOUNTER — Ambulatory Visit: Payer: Medicare HMO | Admitting: Podiatry

## 2017-02-02 ENCOUNTER — Encounter: Payer: Self-pay | Admitting: Podiatry

## 2017-02-02 ENCOUNTER — Ambulatory Visit (INDEPENDENT_AMBULATORY_CARE_PROVIDER_SITE_OTHER): Payer: Medicare HMO | Admitting: Podiatry

## 2017-02-02 VITALS — BP 144/62 | HR 80 | Temp 99.2°F | Resp 18

## 2017-02-02 DIAGNOSIS — L97521 Non-pressure chronic ulcer of other part of left foot limited to breakdown of skin: Secondary | ICD-10-CM

## 2017-02-02 NOTE — Progress Notes (Signed)
Patient ID: Joanne Carlson, female   DOB: 1946-03-24, 71 y.o.   MRN: [id number]    This patient presents for follow-up care initiated on 12/08/2016. Patient has undergone lower extremity arterial Doppler with results not visible on EMR today. Patient has been instructed for follow-up based on lower extremity arterial Doppler with Dr. Gwenlyn Found on 01/07/2017.  Patient essentially a follow-up visit of 12/08/2016 for I&D of bulla with superficial ulceration. Patient currently taking cephalexin 500 mg by mouth twice a day without a complaint and soaking diluted Betadine.The bulla on the left hallux was drained on the visit of 12/08/2016 and cephalexin 500 mg by mouth twice a day (with 2 doses remaining/without any complaint)as well as Betadine soaks. The blister fluid was submitted for culture and sensitivity. Patient says foot is feeling better and has an occasional sharp pain in the left great toe area.Patient was initially evaluated by Dr. Jacelyn Grip approximately 12/05/2016 and cephalexin 500 mg by mouth twice a day was prescribed and patient was referred to our office.Patient has completed all cephalexin 500 mg by mouth twice a day without any difficulty. Also, doing diluted Betadine soaks and applying topical antibiotic ointment daily. Patient is diabetic with end-stage renal disease.  Objective: Patient appears orientated 3 in no apparent pain  Wound culture collected 12/08/2016 Normal skin flora Multiple organisms present none predominate No Staphylococcus aureus isolated No group a strep isolated  X-ray examination left foot 12/08/2016  Intact bony structures fracture or dislocation Increased soft tissue density left hallux No cortical disruption in the phalanges of the hallux Vascular calcification first intermetatarsal space Decreased bone density noted all views  Radiographic impression: Increased soft tissue density left hallux No x-ray sign of osteomyelitis left  hallux  Objective: Patient not wearing surgical shoe as per instruction DP pulses 2/4 bilaterally PT pulses trace palpable bilaterally Sensation to 10 g monofilament wire intact 4/5 right 3/5 left Vibratory sedation reactive bilaterally Ankle reflex reactive bilaterally Left hallux is low-grade edema No edema dorsal left foot Plantar left hallux has a superficial ulcer 39mm in diameter with granular base surrounded by scaling skin.The wound does not probe deep.After debridement wound maintains granular base. There is no drainage or erythema noted in the plantar wound. The wound has fragile closure The lateral aspect of the left hallux from the web space distally has a 8 mm (previous 5 x 7 mm) superficial ulcer with fat layer from the base with scaling around the margins of the wound.. The wound does not probe deep.There is no surrounding erythema, edema, warmth, drainage or malodor  Results of lower extremity arterial Doppler reviewed: Dr. Gwenlyn Found states that is wounds are in the healing process he did not perform an intervention at this time   Assessment: Diabetic with peripheral arterial disease vascular follow-up with Dr. Gwenlyn Found The wounds are improving with local wound care No clinical sign of infection at this time in the wounds the left hallux  Plan: Debride lateral and plantar wounds and apply topical antibiotic ointment dressings Maintain diluted Betadine soaks if drainage is noted DC cephalexin Patient will maintain local wound care with application of Silvadene cream and light gauze dressing Maintain surgical shoe left Limit standing walking Instructed patient if notice any sudden increase in pain, swelling, redness, fever present to the emergency department  Reappoint 7 day

## 2017-02-02 NOTE — Patient Instructions (Signed)
Maintain daily diluted Betadine soaks and apply Silvadene cream to the skin ulcer on the left foot daily If you notice any sudden increase in pain, swelling, redness, fever, warmth present to the emergency department  Diabetes and Foot Care Diabetes may cause you to have problems because of poor blood supply (circulation) to your feet and legs. This may cause the skin on your feet to become thinner, break easier, and heal more slowly. Your skin may become dry, and the skin may peel and crack. You may also have nerve damage in your legs and feet causing decreased feeling in them. You may not notice minor injuries to your feet that could lead to infections or more serious problems. Taking care of your feet is one of the most important things you can do for yourself. Follow these instructions at home:  Wear shoes at all times, even in the house. Do not go barefoot. Bare feet are easily injured.  Check your feet daily for blisters, cuts, and redness. If you cannot see the bottom of your feet, use a mirror or ask someone for help.  Wash your feet with warm water (do not use hot water) and mild soap. Then pat your feet and the areas between your toes until they are completely dry. Do not soak your feet as this can dry your skin.  Apply a moisturizing lotion or petroleum jelly (that does not contain alcohol and is unscented) to the skin on your feet and to dry, brittle toenails. Do not apply lotion between your toes.  Trim your toenails straight across. Do not dig under them or around the cuticle. File the edges of your nails with an emery board or nail file.  Do not cut corns or calluses or try to remove them with medicine.  Wear clean socks or stockings every day. Make sure they are not too tight. Do not wear knee-high stockings since they may decrease blood flow to your legs.  Wear shoes that fit properly and have enough cushioning. To break in new shoes, wear them for just a few hours a day.  This prevents you from injuring your feet. Always look in your shoes before you put them on to be sure there are no objects inside.  Do not cross your legs. This may decrease the blood flow to your feet.  If you find a minor scrape, cut, or break in the skin on your feet, keep it and the skin around it clean and dry. These areas may be cleansed with mild soap and water. Do not cleanse the area with peroxide, alcohol, or iodine.  When you remove an adhesive bandage, be sure not to damage the skin around it.  If you have a wound, look at it several times a day to make sure it is healing.  Do not use heating pads or hot water bottles. They may burn your skin. If you have lost feeling in your feet or legs, you may not know it is happening until it is too late.  Make sure your health care provider performs a complete foot exam at least annually or more often if you have foot problems. Report any cuts, sores, or bruises to your health care provider immediately. Contact a health care provider if:  You have an injury that is not healing.  You have cuts or breaks in the skin.  You have an ingrown nail.  You notice redness on your legs or feet.  You feel burning or tingling in  your legs or feet.  You have pain or cramps in your legs and feet.  Your legs or feet are numb.  Your feet always feel cold. Get help right away if:  There is increasing redness, swelling, or pain in or around a wound.  There is a red line that goes up your leg.  Pus is coming from a wound.  You develop a fever or as directed by your health care provider.  You notice a bad smell coming from an ulcer or wound. This information is not intended to replace advice given to you by your health care provider. Make sure you discuss any questions you have with your health care provider. Document Released: 06/18/2000 Document Revised: 11/27/2015 Document Reviewed: 11/28/2012 Elsevier Interactive Patient Education  2017  [employer].

## 2017-02-02 NOTE — Progress Notes (Signed)
   Subjective:    Patient ID: Joanne Carlson, female    DOB: 05-Jul-1946, 71 y.o.   MRN: [id number]  HPI    Review of Systems  Skin:       Hard spot on my left big toe        Objective:   Physical Exam        Assessment & Plan:

## 2017-02-09 ENCOUNTER — Ambulatory Visit (INDEPENDENT_AMBULATORY_CARE_PROVIDER_SITE_OTHER): Payer: Medicare HMO | Admitting: Podiatry

## 2017-02-09 ENCOUNTER — Encounter: Payer: Self-pay | Admitting: Podiatry

## 2017-02-09 VITALS — BP 142/71 | HR 74 | Temp 97.6°F | Resp 18

## 2017-02-09 DIAGNOSIS — L97521 Non-pressure chronic ulcer of other part of left foot limited to breakdown of skin: Secondary | ICD-10-CM | POA: Diagnosis not present

## 2017-02-09 NOTE — Patient Instructions (Signed)
Apply Silvadene cream to skin ulcer ongoing daily basis, cover with gauze and secure with Coflex tape You notice any sudden increase in pain, swelling, redness, fever present to the emergency department  Diabetes and Foot Care Diabetes may cause you to have problems because of poor blood supply (circulation) to your feet and legs. This may cause the skin on your feet to become thinner, break easier, and heal more slowly. Your skin may become dry, and the skin may peel and crack. You may also have nerve damage in your legs and feet causing decreased feeling in them. You may not notice minor injuries to your feet that could lead to infections or more serious problems. Taking care of your feet is one of the most important things you can do for yourself. Follow these instructions at home:  Wear shoes at all times, even in the house. Do not go barefoot. Bare feet are easily injured.  Check your feet daily for blisters, cuts, and redness. If you cannot see the bottom of your feet, use a mirror or ask someone for help.  Wash your feet with warm water (do not use hot water) and mild soap. Then pat your feet and the areas between your toes until they are completely dry. Do not soak your feet as this can dry your skin.  Apply a moisturizing lotion or petroleum jelly (that does not contain alcohol and is unscented) to the skin on your feet and to dry, brittle toenails. Do not apply lotion between your toes.  Trim your toenails straight across. Do not dig under them or around the cuticle. File the edges of your nails with an emery board or nail file.  Do not cut corns or calluses or try to remove them with medicine.  Wear clean socks or stockings every day. Make sure they are not too tight. Do not wear knee-high stockings since they may decrease blood flow to your legs.  Wear shoes that fit properly and have enough cushioning. To break in new shoes, wear them for just a few hours a day. This prevents you  from injuring your feet. Always look in your shoes before you put them on to be sure there are no objects inside.  Do not cross your legs. This may decrease the blood flow to your feet.  If you find a minor scrape, cut, or break in the skin on your feet, keep it and the skin around it clean and dry. These areas may be cleansed with mild soap and water. Do not cleanse the area with peroxide, alcohol, or iodine.  When you remove an adhesive bandage, be sure not to damage the skin around it.  If you have a wound, look at it several times a day to make sure it is healing.  Do not use heating pads or hot water bottles. They may burn your skin. If you have lost feeling in your feet or legs, you may not know it is happening until it is too late.  Make sure your health care provider performs a complete foot exam at least annually or more often if you have foot problems. Report any cuts, sores, or bruises to your health care provider immediately. Contact a health care provider if:  You have an injury that is not healing.  You have cuts or breaks in the skin.  You have an ingrown nail.  You notice redness on your legs or feet.  You feel burning or tingling in your legs or feet.  You have pain or cramps in your legs and feet.  Your legs or feet are numb.  Your feet always feel cold. Get help right away if:  There is increasing redness, swelling, or pain in or around a wound.  There is a red line that goes up your leg.  Pus is coming from a wound.  You develop a fever or as directed by your health care provider.  You notice a bad smell coming from an ulcer or wound. This information is not intended to replace advice given to you by your health care provider. Make sure you discuss any questions you have with your health care provider. Document Released: 06/18/2000 Document Revised: 11/27/2015 Document Reviewed: 11/28/2012 Elsevier Interactive Patient Education  2017 [employer].

## 2017-02-09 NOTE — Progress Notes (Signed)
Patient ID: Joanne Carlson, female   DOB: February 02, 1946, 71 y.o.   MRN: [id number]    Subjective:.  Patient essentially a follow-up visit of 12/08/2016 for I&D of bulla with superficial ulceration. Patient currently taking cephalexin 500 mg by mouth twice a day without a complaint and soaking diluted Betadine.The bulla on the left hallux was drained on the visit of 12/08/2016 and cephalexin 500 mg by mouth twice a day (with 2 doses remaining/without any complaint)as well as Betadine soaks. The blister fluid was submitted for culture and sensitivity. Patient says foot is feeling better and has an occasional sharp pain in the left great toe area.Patient was initially evaluated by Dr. Jacelyn Grip approximately 12/05/2016 and cephalexin 500 mg by mouth twice a day was prescribed and patient was referred to our office.Patient has completed all cephalexin 500 mg by mouth twice a day without any difficulty. Also, doing diluted Betadine soaks and applying topical Silvadene cream Patient is diabetic with end-stage renal disease.  Objective: Patient appears orientated 3 in no apparent pain  Wound culture collected 12/08/2016 Normal skin flora Multiple organisms present none predominate No Staphylococcus aureus isolated No group a strep isolated  X-ray examination left foot 12/08/2016  Intact bony structures fracture or dislocation Increased soft tissue density left hallux No cortical disruption in the phalanges of the hallux Vascular calcification first intermetatarsal space Decreased bone density noted all views  Radiographic impression: Increased soft tissue density left hallux No x-ray sign of osteomyelitis left hallux  Objective: Patient not wearing surgical shoe as per instruction DP pulses 2/4 bilaterally PT pulses trace palpable bilaterally Sensation to 10 g monofilament wire intact 4/5 right 3/5 left Vibratory sedation reactive bilaterally Ankle reflex reactive bilaterally Left  hallux is low-grade edema No edema dorsal left foot Plantar left hallux has a  dry hemorrhagic callus that has closure after debridement .Marland Kitchen There is no drainage or erythema noted in the plantar wound. The wound has fragile closure The lateral aspect of the left hallux from the web space distally has a 8-10 mm (previous 5 x 7 mm) superficial ulcer with fat layer from the base with scaling around the margins of the wound.. The wound does not probe deep.There is no surrounding erythema, edema, warmth, drainage or malodor. Post debridement created demonstrated bleeding from the debridement.  Results of lower extremity arterial Doppler reviewed: Dr. Gwenlyn Found states that is wounds are in the healing process he did not perform an intervention at this time   Assessment: Diabetic with peripheral arterial disease vascular follow-up with Dr. Gwenlyn Found The wounds are improving with local wound care No clinical sign of infection at this time in the wounds the left hallux  Plan: Debride lateral and plantar wounds and apply Silvadene cream t dressings Maintain diluted Betadine soaks if drainage is noted  Patient will maintain local wound care with application of Silvadene cream and light gauze dressing Maintain surgical shoe left Limit standing walking Instructed patient if notice any sudden increase in pain, swelling, redness, fever present to the emergency department  Reappoint 7 day

## 2017-02-16 ENCOUNTER — Ambulatory Visit (INDEPENDENT_AMBULATORY_CARE_PROVIDER_SITE_OTHER): Payer: Medicare HMO | Admitting: Podiatry

## 2017-02-16 ENCOUNTER — Encounter: Payer: Self-pay | Admitting: Podiatry

## 2017-02-16 VITALS — BP 145/70 | HR 79 | Resp 18

## 2017-02-16 DIAGNOSIS — L97521 Non-pressure chronic ulcer of other part of left foot limited to breakdown of skin: Secondary | ICD-10-CM

## 2017-02-16 NOTE — Patient Instructions (Signed)
Apply Silvadene cream to the skin ulcer on the left great toe daily and cover with gauze Continue wear surgical shoe on left foot If you notice any sudden increase in pain, swelling, redness, fever presents emergency department  Diabetes and Foot Care Diabetes may cause you to have problems because of poor blood supply (circulation) to your feet and legs. This may cause the skin on your feet to become thinner, break easier, and heal more slowly. Your skin may become dry, and the skin may peel and crack. You may also have nerve damage in your legs and feet causing decreased feeling in them. You may not notice minor injuries to your feet that could lead to infections or more serious problems. Taking care of your feet is one of the most important things you can do for yourself. Follow these instructions at home:  Wear shoes at all times, even in the house. Do not go barefoot. Bare feet are easily injured.  Check your feet daily for blisters, cuts, and redness. If you cannot see the bottom of your feet, use a mirror or ask someone for help.  Wash your feet with warm water (do not use hot water) and mild soap. Then pat your feet and the areas between your toes until they are completely dry. Do not soak your feet as this can dry your skin.  Apply a moisturizing lotion or petroleum jelly (that does not contain alcohol and is unscented) to the skin on your feet and to dry, brittle toenails. Do not apply lotion between your toes.  Trim your toenails straight across. Do not dig under them or around the cuticle. File the edges of your nails with an emery board or nail file.  Do not cut corns or calluses or try to remove them with medicine.  Wear clean socks or stockings every day. Make sure they are not too tight. Do not wear knee-high stockings since they may decrease blood flow to your legs.  Wear shoes that fit properly and have enough cushioning. To break in new shoes, wear them for just a few hours a  day. This prevents you from injuring your feet. Always look in your shoes before you put them on to be sure there are no objects inside.  Do not cross your legs. This may decrease the blood flow to your feet.  If you find a minor scrape, cut, or break in the skin on your feet, keep it and the skin around it clean and dry. These areas may be cleansed with mild soap and water. Do not cleanse the area with peroxide, alcohol, or iodine.  When you remove an adhesive bandage, be sure not to damage the skin around it.  If you have a wound, look at it several times a day to make sure it is healing.  Do not use heating pads or hot water bottles. They may burn your skin. If you have lost feeling in your feet or legs, you may not know it is happening until it is too late.  Make sure your health care provider performs a complete foot exam at least annually or more often if you have foot problems. Report any cuts, sores, or bruises to your health care provider immediately. Contact a health care provider if:  You have an injury that is not healing.  You have cuts or breaks in the skin.  You have an ingrown nail.  You notice redness on your legs or feet.  You feel burning or  tingling in your legs or feet.  You have pain or cramps in your legs and feet.  Your legs or feet are numb.  Your feet always feel cold. Get help right away if:  There is increasing redness, swelling, or pain in or around a wound.  There is a red line that goes up your leg.  Pus is coming from a wound.  You develop a fever or as directed by your health care provider.  You notice a bad smell coming from an ulcer or wound. This information is not intended to replace advice given to you by your health care provider. Make sure you discuss any questions you have with your health care provider. Document Released: 06/18/2000 Document Revised: 11/27/2015 Document Reviewed: 11/28/2012 Elsevier Interactive Patient Education   2017 [employer].

## 2017-02-16 NOTE — Progress Notes (Signed)
Patient ID: Joanne Carlson, female   DOB: 23-Jan-1946, 71 y.o.   MRN: [id number]   Patient essentially a follow-up visit of 12/08/2016 for I&D of bulla with superficial ulceration. Patient currently taking cephalexin 500 mg by mouth twice a day without a complaint and soaking diluted Betadine.The bulla on the left hallux was drained on the visit of 12/08/2016 and cephalexin 500 mg by mouth twice a day (with 2 doses remaining/without any complaint)as well as Betadine soaks. The blister fluid was submitted for culture and sensitivity. Patient says foot is feeling better and has an occasional sharp pain in the left great toe area.Patient was initially evaluated by Dr. Jacelyn Grip approximately 12/05/2016 and cephalexin 500 mg by mouth twice a day was prescribed and patient was referred to our office.Patient has completed all cephalexin 500 mg by mouth twice a day without any difficulty. Also, doing diluted Betadine soaks and applying topical Silvadene cream Patient is diabetic with end-stage renal disease.  Objective: Patient appears orientated 3 in no apparent pain  Wound culture collected 12/08/2016 Normal skin flora Multiple organisms present none predominate No Staphylococcus aureus isolated No group a strep isolated  X-ray examination left foot 12/08/2016  Intact bony structures fracture or dislocation Increased soft tissue density left hallux No cortical disruption in the phalanges of the hallux Vascular calcification first intermetatarsal space Decreased bone density noted all views  Radiographic impression: Increased soft tissue density left hallux No x-ray sign of osteomyelitis left hallux  Objective: Patient not wearing surgical shoe as per instruction DP pulses 2/4 bilaterally PT pulses trace palpable bilaterally Sensation to 10 g monofilament wire intact 4/5 right 3/5 left Vibratory sedation reactive bilaterally Ankle reflex reactive bilaterally Left hallux is low-grade  edema No edema dorsal left foot Plantar left hallux has a  dry hemorrhagic callus that has closure after debridement .Marland Kitchen There is no drainage or erythema noted in the plantar wound.The wound has fragile closure The lateral aspect of the left hallux from the web space distally has a 5mm superficial ulcer with fat layer from the base with scaling around the margins of the wound.. The wound does not probe deep.There is no surrounding erythema, edema, warmth, drainage or malodor. Post debridement created demonstrated bleeding from the debridement.  Results of lower extremity arterial Doppler reviewed: Dr. Gwenlyn Found states that is wounds are in the healing process he did not perform an intervention at this time   Assessment: Diabetic with peripheral arterial disease vascular follow-up with Dr. Gwenlyn Found The wounds are improving with local wound care No clinical sign of infection at this time in the wounds the left hallux  Plan: Debride lateral and plantar wounds and apply Silvadene cream t dressings Maintain diluted Betadine soaks if drainage is noted  Patient will maintain local wound care with application of Silvadene creamand light gauze dressing Maintain surgical shoe left Limit standing walking Instructed patient if notice any sudden increase in pain, swelling, redness, fever present to the emergency department  Reappoint 14 days

## 2017-02-25 ENCOUNTER — Ambulatory Visit (INDEPENDENT_AMBULATORY_CARE_PROVIDER_SITE_OTHER): Payer: Medicare HMO | Admitting: Cardiovascular Disease

## 2017-02-25 ENCOUNTER — Encounter: Payer: Self-pay | Admitting: Cardiovascular Disease

## 2017-02-25 VITALS — BP 154/72 | HR 71 | Ht 64.0 in | Wt 172.8 lb

## 2017-02-25 DIAGNOSIS — I998 Other disorder of circulatory system: Secondary | ICD-10-CM | POA: Diagnosis not present

## 2017-02-25 DIAGNOSIS — I70229 Atherosclerosis of native arteries of extremities with rest pain, unspecified extremity: Secondary | ICD-10-CM

## 2017-02-25 DIAGNOSIS — I10 Essential (primary) hypertension: Secondary | ICD-10-CM

## 2017-02-25 NOTE — Progress Notes (Signed)
Joanne Carlson returns today for follow-up of her left great toe ulcer probably related to critical limb ischemia. She does have a high-grade lesion in her mid to distal left SFA demonstrate by duplex ultrasound 01/03/17 the left ABI 0.78. She has not occluded distal right SFA. She denies claudication and does have diabetic peripheral neuropathy. She recently saw Dr. Amalia Hailey, podiatrist, in the office who said that her wound has improved with aggressive local wound care and is not painful. At this point, I continued to watch her conservatively I will see her back in 6 weeks. Should her wound he'll also see her back on an annual basis where should her wound become more symptomatic and/or infected we will proceed with angiography and intervention.

## 2017-02-25 NOTE — Patient Instructions (Signed)
Medication Instructions: Your physician recommends that you continue on your current medications as directed. Please refer to the Current Medication list given to you today.  Follow-Up: Your physician recommends that you schedule a follow-up appointment in 6 weeks with Dr. Gwenlyn Found.  If you need a refill on your cardiac medications before your next appointment, please call your pharmacy.

## 2017-02-25 NOTE — Assessment & Plan Note (Signed)
Joanne Carlson returns today for follow-up of her left great toe ulcer probably related to critical limb ischemia. She does have a high-grade lesion in her mid to distal left SFA demonstrate by duplex ultrasound 01/03/17 the left ABI 0.78. She has not occluded distal right SFA. She denies claudication and does have diabetic peripheral neuropathy. She recently saw Dr. Amalia Hailey, podiatrist, in the office who said that her wound has improved with aggressive local wound care and is not painful. At this point, I continued to watch her conservatively I will see her back in 6 weeks. Should her wound he'll also see her back on an annual basis where should her wound become more symptomatic and/or infected we will proceed with angiography and intervention.

## 2017-02-28 ENCOUNTER — Encounter: Payer: Self-pay | Admitting: Podiatry

## 2017-02-28 ENCOUNTER — Ambulatory Visit (INDEPENDENT_AMBULATORY_CARE_PROVIDER_SITE_OTHER): Payer: Medicare HMO | Admitting: Podiatry

## 2017-02-28 DIAGNOSIS — L97521 Non-pressure chronic ulcer of other part of left foot limited to breakdown of skin: Secondary | ICD-10-CM | POA: Diagnosis not present

## 2017-02-28 NOTE — Patient Instructions (Signed)
Continue to apply Silvadene cream to the skin ulcer inside and bottom of the left big toe daily, cover with gauze Continue wear surgical shoe on your left foot Limit standing walking If you notice any sudden increase in pain, swelling, redness, fever present to emergency department  Diabetes and Foot Care Diabetes may cause you to have problems because of poor blood supply (circulation) to your feet and legs. This may cause the skin on your feet to become thinner, break easier, and heal more slowly. Your skin may become dry, and the skin may peel and crack. You may also have nerve damage in your legs and feet causing decreased feeling in them. You may not notice minor injuries to your feet that could lead to infections or more serious problems. Taking care of your feet is one of the most important things you can do for yourself. Follow these instructions at home:  Wear shoes at all times, even in the house. Do not go barefoot. Bare feet are easily injured.  Check your feet daily for blisters, cuts, and redness. If you cannot see the bottom of your feet, use a mirror or ask someone for help.  Wash your feet with warm water (do not use hot water) and mild soap. Then pat your feet and the areas between your toes until they are completely dry. Do not soak your feet as this can dry your skin.  Apply a moisturizing lotion or petroleum jelly (that does not contain alcohol and is unscented) to the skin on your feet and to dry, brittle toenails. Do not apply lotion between your toes.  Trim your toenails straight across. Do not dig under them or around the cuticle. File the edges of your nails with an emery board or nail file.  Do not cut corns or calluses or try to remove them with medicine.  Wear clean socks or stockings every day. Make sure they are not too tight. Do not wear knee-high stockings since they may decrease blood flow to your legs.  Wear shoes that fit properly and have enough cushioning.  To break in new shoes, wear them for just a few hours a day. This prevents you from injuring your feet. Always look in your shoes before you put them on to be sure there are no objects inside.  Do not cross your legs. This may decrease the blood flow to your feet.  If you find a minor scrape, cut, or break in the skin on your feet, keep it and the skin around it clean and dry. These areas may be cleansed with mild soap and water. Do not cleanse the area with peroxide, alcohol, or iodine.  When you remove an adhesive bandage, be sure not to damage the skin around it.  If you have a wound, look at it several times a day to make sure it is healing.  Do not use heating pads or hot water bottles. They may burn your skin. If you have lost feeling in your feet or legs, you may not know it is happening until it is too late.  Make sure your health care provider performs a complete foot exam at least annually or more often if you have foot problems. Report any cuts, sores, or bruises to your health care provider immediately. Contact a health care provider if:  You have an injury that is not healing.  You have cuts or breaks in the skin.  You have an ingrown nail.  You notice redness on  your legs or feet.  You feel burning or tingling in your legs or feet.  You have pain or cramps in your legs and feet.  Your legs or feet are numb.  Your feet always feel cold. Get help right away if:  There is increasing redness, swelling, or pain in or around a wound.  There is a red line that goes up your leg.  Pus is coming from a wound.  You develop a fever or as directed by your health care provider.  You notice a bad smell coming from an ulcer or wound. This information is not intended to replace advice given to you by your health care provider. Make sure you discuss any questions you have with your health care provider. Document Released: 06/18/2000 Document Revised: 11/27/2015 Document  Reviewed: 11/28/2012 Elsevier Interactive Patient Education  2017 [employer].

## 2017-02-28 NOTE — Progress Notes (Signed)
Patient ID: Joanne Carlson, female   DOB: 1945/08/12, 71 y.o.   MRN: [id number]    Patient essentially a follow-up visit of 12/08/2016 for I&D of bulla with superficial ulceration. Patient currently taking cephalexin 500 mg by mouth twice a day without a complaint and soaking diluted Betadine.The bulla on the left hallux was drained on the visit of 12/08/2016 and cephalexin 500 mg by mouth twice a day (with 2 doses remaining/without any complaint)as well as Betadine soaks. The blister fluid was submitted for culture and sensitivity. Patient says foot is feeling better and has an occasional sharp pain in the left great toe area.Patient was initially evaluated by Dr. Jacelyn Grip approximately 12/05/2016 and cephalexin 500 mg by mouth twice a day was prescribed and patient was referred to our office.Patient has completed all cephalexin 500 mg by mouth twice a day without any difficulty. Also, doing diluted Betadine soaks and applying topical Silvadene cream Patient is diabetic with end-stage renal disease.  Objective: Patient appears orientated 3 in no apparent pain  Wound culture collected 12/08/2016 Normal skin flora Multiple organisms present none predominate No Staphylococcus aureus isolated No group a strep isolated  X-ray examination left foot 12/08/2016  Intact bony structures fracture or dislocation Increased soft tissue density left hallux No cortical disruption in the phalanges of the hallux Vascular calcification first intermetatarsal space Decreased bone density noted all views  Radiographic impression: Increased soft tissue density left hallux No x-ray sign of osteomyelitis left hallux  Objective: Patient not wearing surgical shoe as per instruction DP pulses 2/4 bilaterally PT pulses trace palpable bilaterally Sensation to 10 g monofilament wire intact 4/5 right 3/5 left Vibratory sedation reactive bilaterally Ankle reflex reactive bilaterally Left hallux is  low-grade edema No edema dorsal left foot Plantar left hallux has a superficial 8 x 5 mm wound with a dry base .Marland Kitchen There is no drainage or erythema noted in the plantar wound.The wound has fragile closure The lateral aspect of the left hallux from the web space distally has a 64mmsuperficial ulcer with fat layer from the base with scaling around the margins of the wound.. The wound does not probe deep.There is no surrounding erythema, edema, warmth, drainage or malodor.Post debridement created demonstrated bleeding from the debridement.  Results of lower extremity arterial Doppler reviewed: Dr. Gwenlyn Found states that is wounds are in the healing process he did not perform an intervention at this time   Assessment: Diabetic with peripheral arterial disease vascular follow-up with Dr. Gwenlyn Found The lateral ulcer shows no clinical sign of infection, however, diameter has not reduced Plantar skin ulcer left superficial noninfected  Plan: Debride lateral and plantar wounds and apply Silvadene cream t dressings Debride small hyperkeratoses around the plantar wound left Maintain diluted Betadine soaks if drainage is noted  Patient will maintain local wound care with application of Silvadene creamand light gauze dressing Maintain surgical shoe left Limit standing walking Instructed patient if notice any sudden increase in pain, swelling, redness, fever present to the emergency department Will refer patient to wound care and maintain office follow-up until transfer is complete  Reappoint 2 weeks  Reappoint 14 days

## 2017-03-01 ENCOUNTER — Telehealth: Payer: Self-pay | Admitting: *Deleted

## 2017-03-01 DIAGNOSIS — L97521 Non-pressure chronic ulcer of other part of left foot limited to breakdown of skin: Secondary | ICD-10-CM

## 2017-03-01 NOTE — Telephone Encounter (Signed)
Faxed required form. clinicals and demographics to [facility].

## 2017-03-02 ENCOUNTER — Ambulatory Visit: Payer: Medicare HMO | Admitting: Podiatry

## 2017-03-14 ENCOUNTER — Ambulatory Visit (INDEPENDENT_AMBULATORY_CARE_PROVIDER_SITE_OTHER): Payer: Medicare HMO | Admitting: Podiatry

## 2017-03-14 ENCOUNTER — Encounter: Payer: Self-pay | Admitting: Podiatry

## 2017-03-14 VITALS — BP 161/76 | HR 74

## 2017-03-14 DIAGNOSIS — L97521 Non-pressure chronic ulcer of other part of left foot limited to breakdown of skin: Secondary | ICD-10-CM

## 2017-03-14 NOTE — Progress Notes (Signed)
Patient ID: Joanne Carlson, female   DOB: 29-Apr-1946, 71 y.o.   MRN: [id number]    Patient essentially a follow-up visit of 12/08/2016 for I&D of bulla with superficial ulceration. Patient currently taking cephalexin 500 mg by mouth twice a day without a complaint and soaking diluted Betadine.The bulla on the left hallux was drained on the visit of 12/08/2016 and cephalexin 500 mg by mouth twice a day (with 2 doses remaining/without any complaint)as well as Betadine soaks. The blister fluid was submitted for culture and sensitivity. Patient says foot is feeling better and has an occasional sharp pain in the left great toe area.Patient was initially evaluated by Dr. Jacelyn Grip approximately 12/05/2016 and cephalexin 500 mg by mouth twice a day was prescribed and patient was referred to our office.Patient has completed all cephalexin 500 mg by mouth twice a day without any difficulty. Also, doing diluted Betadine soaks and applying topical Silvadene cream Patient is diabetic with end-stage renal disease.  Objective: Patient appears orientated 3 in no apparent pain  Wound culture collected 12/08/2016 Normal skin flora Multiple organisms present none predominate No Staphylococcus aureus isolated No group a strep isolated  X-ray examination left foot 12/08/2016  Intact bony structures fracture or dislocation Increased soft tissue density left hallux No cortical disruption in the phalanges of the hallux Vascular calcification first intermetatarsal space Decreased bone density noted all views  Radiographic impression: Increased soft tissue density left hallux No x-ray sign of osteomyelitis left hallux  Objective: Patient not wearing surgical shoe as per instruction DP pulses 2/4 bilaterally PT pulses trace palpable bilaterally Sensation to 10 g monofilament wire intact 4/5 right 3/5 left Vibratory sedation reactive bilaterally Ankle reflex reactive bilaterally Left hallux is  low-grade edema No edema dorsal left foot Plantar left hallux has a callus area and after debridement remains closed The lateral left hallux has a 5 mm ulcer with scaling and minimal keratoses on the perimeter. The wound exposes fat there is no surrounding erythema, edema, warmth, drainage or malodor   Results of lower extremity arterial Doppler reviewed: Dr. Gwenlyn Found states that is wounds are in the healing process he did not perform an intervention at this time    Assessment: Diabetic with peripheral arterial disease vascular follow-up with Dr. Gwenlyn Found The lateral ulcer shows no clinical sign of infection, however, diameter has not reduced Plantar skin ulcer left as closed  Plan: Debride the lateral skin ulcer on packed with Iodosorb and apply gauze dressing Maintain surgical shoe Patient will maintain local wound care including application Silvadene cream daily Patient has a scheduled visit with wound care on 03/22/2017 and follow-up care will be transferred to the wound care center.

## 2017-03-14 NOTE — Patient Instructions (Signed)
Apply Silvadene cream to wound on left great toe daily and cover with gauze Continue wearing the surgical shoe on your left foot Care transferred to wound care with next scheduled visit at the wound care center on 03/22/2017  Diabetes and Foot Care Diabetes may cause you to have problems because of poor blood supply (circulation) to your feet and legs. This may cause the skin on your feet to become thinner, break easier, and heal more slowly. Your skin may become dry, and the skin may peel and crack. You may also have nerve damage in your legs and feet causing decreased feeling in them. You may not notice minor injuries to your feet that could lead to infections or more serious problems. Taking care of your feet is one of the most important things you can do for yourself. Follow these instructions at home:  Wear shoes at all times, even in the house. Do not go barefoot. Bare feet are easily injured.  Check your feet daily for blisters, cuts, and redness. If you cannot see the bottom of your feet, use a mirror or ask someone for help.  Wash your feet with warm water (do not use hot water) and mild soap. Then pat your feet and the areas between your toes until they are completely dry. Do not soak your feet as this can dry your skin.  Apply a moisturizing lotion or petroleum jelly (that does not contain alcohol and is unscented) to the skin on your feet and to dry, brittle toenails. Do not apply lotion between your toes.  Trim your toenails straight across. Do not dig under them or around the cuticle. File the edges of your nails with an emery board or nail file.  Do not cut corns or calluses or try to remove them with medicine.  Wear clean socks or stockings every day. Make sure they are not too tight. Do not wear knee-high stockings since they may decrease blood flow to your legs.  Wear shoes that fit properly and have enough cushioning. To break in new shoes, wear them for just a few hours a  day. This prevents you from injuring your feet. Always look in your shoes before you put them on to be sure there are no objects inside.  Do not cross your legs. This may decrease the blood flow to your feet.  If you find a minor scrape, cut, or break in the skin on your feet, keep it and the skin around it clean and dry. These areas may be cleansed with mild soap and water. Do not cleanse the area with peroxide, alcohol, or iodine.  When you remove an adhesive bandage, be sure not to damage the skin around it.  If you have a wound, look at it several times a day to make sure it is healing.  Do not use heating pads or hot water bottles. They may burn your skin. If you have lost feeling in your feet or legs, you may not know it is happening until it is too late.  Make sure your health care provider performs a complete foot exam at least annually or more often if you have foot problems. Report any cuts, sores, or bruises to your health care provider immediately. Contact a health care provider if:  You have an injury that is not healing.  You have cuts or breaks in the skin.  You have an ingrown nail.  You notice redness on your legs or feet.  You feel burning  or tingling in your legs or feet.  You have pain or cramps in your legs and feet.  Your legs or feet are numb.  Your feet always feel cold. Get help right away if:  There is increasing redness, swelling, or pain in or around a wound.  There is a red line that goes up your leg.  Pus is coming from a wound.  You develop a fever or as directed by your health care provider.  You notice a bad smell coming from an ulcer or wound. This information is not intended to replace advice given to you by your health care provider. Make sure you discuss any questions you have with your health care provider. Document Released: 06/18/2000 Document Revised: 11/27/2015 Document Reviewed: 11/28/2012 Elsevier Interactive Patient Education   2017 [employer].

## 2017-03-23 ENCOUNTER — Encounter (HOSPITAL_BASED_OUTPATIENT_CLINIC_OR_DEPARTMENT_OTHER): Payer: Medicare HMO

## 2017-03-30 ENCOUNTER — Other Ambulatory Visit: Payer: Self-pay | Admitting: Family Medicine

## 2017-03-30 DIAGNOSIS — Z1231 Encounter for screening mammogram for malignant neoplasm of breast: Secondary | ICD-10-CM

## 2017-04-08 ENCOUNTER — Ambulatory Visit: Payer: Medicare HMO | Admitting: Cardiovascular Disease

## 2017-04-20 ENCOUNTER — Ambulatory Visit (HOSPITAL_COMMUNITY)
Admission: RE | Admit: 2017-04-20 | Discharge: 2017-04-20 | Disposition: A | Discharge status: Home / Self Care | Payer: Medicare HMO | Source: Ambulatory Visit | Attending: Surgery | Admitting: Surgery

## 2017-04-20 ENCOUNTER — Encounter (HOSPITAL_BASED_OUTPATIENT_CLINIC_OR_DEPARTMENT_OTHER): Payer: Medicare HMO | Attending: Surgery

## 2017-04-20 ENCOUNTER — Other Ambulatory Visit: Payer: Self-pay | Admitting: Surgery

## 2017-04-20 DIAGNOSIS — M869 Osteomyelitis, unspecified: Secondary | ICD-10-CM

## 2017-04-20 DIAGNOSIS — E1151 Type 2 diabetes mellitus with diabetic peripheral angiopathy without gangrene: Secondary | ICD-10-CM | POA: Insufficient documentation

## 2017-04-20 DIAGNOSIS — X58XXXA Exposure to other specified factors, initial encounter: Secondary | ICD-10-CM | POA: Insufficient documentation

## 2017-04-20 DIAGNOSIS — S91102A Unspecified open wound of left great toe without damage to nail, initial encounter: Secondary | ICD-10-CM | POA: Insufficient documentation

## 2017-04-20 DIAGNOSIS — N186 End stage renal disease: Secondary | ICD-10-CM | POA: Diagnosis not present

## 2017-04-20 DIAGNOSIS — E11621 Type 2 diabetes mellitus with foot ulcer: Secondary | ICD-10-CM | POA: Insufficient documentation

## 2017-04-20 DIAGNOSIS — E1140 Type 2 diabetes mellitus with diabetic neuropathy, unspecified: Secondary | ICD-10-CM | POA: Diagnosis not present

## 2017-04-20 DIAGNOSIS — E1122 Type 2 diabetes mellitus with diabetic chronic kidney disease: Secondary | ICD-10-CM | POA: Diagnosis not present

## 2017-04-20 DIAGNOSIS — Z992 Dependence on renal dialysis: Secondary | ICD-10-CM | POA: Diagnosis not present

## 2017-04-20 DIAGNOSIS — L97519 Non-pressure chronic ulcer of other part of right foot with unspecified severity: Secondary | ICD-10-CM | POA: Diagnosis not present

## 2017-04-20 DIAGNOSIS — I10 Essential (primary) hypertension: Secondary | ICD-10-CM | POA: Diagnosis not present

## 2017-04-27 DIAGNOSIS — L97519 Non-pressure chronic ulcer of other part of right foot with unspecified severity: Secondary | ICD-10-CM | POA: Diagnosis not present

## 2017-05-04 DIAGNOSIS — L97519 Non-pressure chronic ulcer of other part of right foot with unspecified severity: Secondary | ICD-10-CM | POA: Diagnosis not present

## 2017-05-11 ENCOUNTER — Encounter (HOSPITAL_BASED_OUTPATIENT_CLINIC_OR_DEPARTMENT_OTHER): Payer: Medicare HMO | Attending: Surgery

## 2017-05-11 DIAGNOSIS — E1122 Type 2 diabetes mellitus with diabetic chronic kidney disease: Secondary | ICD-10-CM | POA: Insufficient documentation

## 2017-05-11 DIAGNOSIS — Z992 Dependence on renal dialysis: Secondary | ICD-10-CM | POA: Diagnosis not present

## 2017-05-11 DIAGNOSIS — Z09 Encounter for follow-up examination after completed treatment for conditions other than malignant neoplasm: Secondary | ICD-10-CM | POA: Diagnosis present

## 2017-05-11 DIAGNOSIS — Z8631 Personal history of diabetic foot ulcer: Secondary | ICD-10-CM | POA: Diagnosis not present

## 2017-05-11 DIAGNOSIS — N186 End stage renal disease: Secondary | ICD-10-CM | POA: Diagnosis not present

## 2017-05-16 ENCOUNTER — Encounter: Payer: Self-pay | Admitting: Podiatry

## 2017-05-16 ENCOUNTER — Ambulatory Visit (INDEPENDENT_AMBULATORY_CARE_PROVIDER_SITE_OTHER): Payer: Medicare HMO | Admitting: Podiatry

## 2017-05-16 VITALS — BP 174/69 | HR 82

## 2017-05-16 DIAGNOSIS — L608 Other nail disorders: Secondary | ICD-10-CM

## 2017-05-16 DIAGNOSIS — E1151 Type 2 diabetes mellitus with diabetic peripheral angiopathy without gangrene: Secondary | ICD-10-CM | POA: Diagnosis not present

## 2017-05-16 DIAGNOSIS — E1142 Type 2 diabetes mellitus with diabetic polyneuropathy: Secondary | ICD-10-CM

## 2017-05-16 NOTE — Patient Instructions (Signed)

## 2017-05-16 NOTE — Progress Notes (Signed)
Patient ID: Joanne Carlson, female   DOB: 03/18/1946, 71 y.o.   MRN: [id number]   Subjective: Patient presents today requesting debridement of toenails right and left feet. The toenails are incurvated and elongated patient has difficulty trimming toenails. Also, patient has a history of peripheral arterial disease peripheral neuropathy associated with diabetes. Patient has a recent history of a skin ulcer left hallux that resolved with local wound care  Objective: Orientated 3 DP pulses 2/4 bilaterally PT pulses trace palpable bilaterally Sensation to 10 g monofilament wire intact 4/5 right 3/5 left Vibratory sedation reactive bilaterally Ankle reflex reactive bilaterally Sensation to 10 g monofilament wire intact 4/5 right and 3/5 left Vibratory sensation reactive bilaterally Ankle reflexes reactive bilaterally Crusting on the plantar left hallux and lateral left hallux with previous history of ulcer both sites No open wounds bilaterally The toenails elongated, incurvated 6-10  Assessment: Diabetic with peripheral vascular disease, peripheral arterial disease. Patient has had evaluation with Dr. [name] Diabetic peripheral neuropathy Resolved diabetic skin ulcers left hallux Incurvated toenails 6-10  Plan: Debrided toenails 6-10 mechanically and electrically without a bleeding  Reappoint 3 months or sooner if patient has concern

## 2017-06-10 ENCOUNTER — Ambulatory Visit
Admission: RE | Admit: 2017-06-10 | Discharge: 2017-06-10 | Disposition: A | Discharge status: Home / Self Care | Payer: Medicare HMO | Source: Ambulatory Visit | Attending: Family Medicine | Admitting: Family Medicine

## 2017-06-10 DIAGNOSIS — Z1231 Encounter for screening mammogram for malignant neoplasm of breast: Secondary | ICD-10-CM

## 2017-08-15 ENCOUNTER — Ambulatory Visit: Payer: Medicare HMO | Admitting: Podiatry

## 2017-08-31 ENCOUNTER — Encounter: Payer: Self-pay | Admitting: Podiatry

## 2017-08-31 ENCOUNTER — Ambulatory Visit (INDEPENDENT_AMBULATORY_CARE_PROVIDER_SITE_OTHER): Payer: Medicare HMO | Admitting: Podiatry

## 2017-08-31 DIAGNOSIS — I70235 Atherosclerosis of native arteries of right leg with ulceration of other part of foot: Secondary | ICD-10-CM | POA: Diagnosis not present

## 2017-08-31 DIAGNOSIS — M79676 Pain in unspecified toe(s): Secondary | ICD-10-CM

## 2017-08-31 DIAGNOSIS — B351 Tinea unguium: Secondary | ICD-10-CM | POA: Diagnosis not present

## 2017-08-31 DIAGNOSIS — L97512 Non-pressure chronic ulcer of other part of right foot with fat layer exposed: Secondary | ICD-10-CM

## 2017-08-31 DIAGNOSIS — E0843 Diabetes mellitus due to underlying condition with diabetic autonomic (poly)neuropathy: Secondary | ICD-10-CM | POA: Diagnosis not present

## 2017-08-31 NOTE — Progress Notes (Signed)
   Subjective:    Patient ID: Joanne Carlson, female    DOB: 1945/10/09, 72 y.o.   MRN: [id number]  HPI  Chief Complaint  Patient presents with  . Foot Ulcer    small diabetic ulcer on right great toe        Review of Systems  All other systems reviewed and are negative.      Objective:   Physical Exam        Assessment & Plan:

## 2017-09-02 DIAGNOSIS — N186 End stage renal disease: Secondary | ICD-10-CM | POA: Diagnosis not present

## 2017-09-02 DIAGNOSIS — Z992 Dependence on renal dialysis: Secondary | ICD-10-CM | POA: Diagnosis not present

## 2017-09-02 DIAGNOSIS — E1122 Type 2 diabetes mellitus with diabetic chronic kidney disease: Secondary | ICD-10-CM | POA: Diagnosis not present

## 2017-09-03 DIAGNOSIS — N186 End stage renal disease: Secondary | ICD-10-CM | POA: Diagnosis not present

## 2017-09-03 DIAGNOSIS — N2581 Secondary hyperparathyroidism of renal origin: Secondary | ICD-10-CM | POA: Diagnosis not present

## 2017-09-04 NOTE — Progress Notes (Signed)
SUBJECTIVE Patient with a history of diabetes mellitus presents to office today complaining of elongated, thickened nails that cause pain while ambulating in shoes. She is unable to trim her own nails. She is also here for a follow up evaluation of an ulceration to the right great toe. She reports some associated discoloration of the toe that her dialysis nurse pointed out. Patient is here for further evaluation and treatment.   Past Medical History:  Diagnosis Date  . Arthritis   . Diabetes mellitus    diet controlled-  . Dizziness    occasionally  . ESRD (end stage renal disease) ([place])    [place]  . History of colon polyps   . History of gout    takes Allopurinol daily  . Hyperlipidemia    takes Pravastatin daily  . Hypertension    takes Monopril daily  . Joint pain   . Patient is Jehovah's Witness    NO BLOOD OR BLOOD PRODUCTS. Albumin okay.   . Peripheral neuropathy   . Peripheral vascular disease ([place])   . Pneumonia    hx of-yrs ago  . Stroke [facility])     OBJECTIVE General Patient is awake, alert, and oriented x 3 and in no acute distress.  Derm Skin is dry and supple bilateral. Nails are tender, long, thickened and dystrophic with subungual debris, consistent with onychomycosis, 1-5 bilateral.   Wound #1 noted to the right great toe measuring 0.8 x 0.8 x 0.2 cm.   To the above-noted ulceration, there is no eschar. There is a moderate amount of slough, fibrin and necrotic tissue. Granulation tissue and wound base is red. There is no malodor. There is a minimal amount of serosanginous drainage noted. Periwound integrity is intact.   Vasc  DP and PT pedal pulses diminished bilaterally. Temperature gradient within normal limits.   Neuro Epicritic and protective threshold sensation diminished bilaterally.   Musculoskeletal Exam No symptomatic pedal deformities noted bilateral. Muscular strength within normal limits.  ASSESSMENT 1. Diabetes Mellitus w/  peripheral neuropathy 2. Onychomycosis of nail due to dermatophyte bilateral 3. Pain in foot bilateral 4. Ulceration of the right great toe secondary to diabetes mellitus and peripheral vascular disease  PLAN OF CARE 1. Patient evaluated today. 2. Instructed to maintain good pedal hygiene and foot care. Stressed importance of controlling blood sugar.  3. Mechanical debridement of nails 1-5 bilaterally performed using a nail nipper. Filed with dremel without incident.  4. Medically necessary excisional debridement including subcutaneous tissue was performed using a tissue nipper and a chisel blade. Excisional debridement of all the necrotic nonviable tissue down to healthy bleeding viable tissue was performed with post-debridement measurements same as pre-. 5. The wound was cleansed and dry sterile dressing applied. 6. Recommended antibiotic ointment daily with a Band-Aid.  7. Appointment with Liliane Channel for DM shoes and insoles.  8. Continue management with Dr. Gwenlyn Found for PVD.  9. Return to clinic in 4 weeks.    Edrick Kins, DPM [facility]  Dr. Edrick Kins, [place]                                        [place], [place] [address]                Office [phone]  Fax [phone]

## 2017-09-06 DIAGNOSIS — N186 End stage renal disease: Secondary | ICD-10-CM | POA: Diagnosis not present

## 2017-09-06 DIAGNOSIS — N2581 Secondary hyperparathyroidism of renal origin: Secondary | ICD-10-CM | POA: Diagnosis not present

## 2017-09-08 DIAGNOSIS — N186 End stage renal disease: Secondary | ICD-10-CM | POA: Diagnosis not present

## 2017-09-08 DIAGNOSIS — N2581 Secondary hyperparathyroidism of renal origin: Secondary | ICD-10-CM | POA: Diagnosis not present

## 2017-09-09 DIAGNOSIS — L97511 Non-pressure chronic ulcer of other part of right foot limited to breakdown of skin: Secondary | ICD-10-CM | POA: Diagnosis not present

## 2017-09-09 DIAGNOSIS — M109 Gout, unspecified: Secondary | ICD-10-CM | POA: Diagnosis not present

## 2017-09-09 DIAGNOSIS — E118 Type 2 diabetes mellitus with unspecified complications: Secondary | ICD-10-CM | POA: Diagnosis not present

## 2017-09-09 DIAGNOSIS — E669 Obesity, unspecified: Secondary | ICD-10-CM | POA: Diagnosis not present

## 2017-09-09 DIAGNOSIS — I770 Arteriovenous fistula, acquired: Secondary | ICD-10-CM | POA: Diagnosis not present

## 2017-09-09 DIAGNOSIS — N185 Chronic kidney disease, stage 5: Secondary | ICD-10-CM | POA: Diagnosis not present

## 2017-09-09 DIAGNOSIS — E785 Hyperlipidemia, unspecified: Secondary | ICD-10-CM | POA: Diagnosis not present

## 2017-09-09 DIAGNOSIS — E11621 Type 2 diabetes mellitus with foot ulcer: Secondary | ICD-10-CM | POA: Diagnosis not present

## 2017-09-09 DIAGNOSIS — N2581 Secondary hyperparathyroidism of renal origin: Secondary | ICD-10-CM | POA: Diagnosis not present

## 2017-09-09 DIAGNOSIS — E1122 Type 2 diabetes mellitus with diabetic chronic kidney disease: Secondary | ICD-10-CM | POA: Diagnosis not present

## 2017-09-09 DIAGNOSIS — I10 Essential (primary) hypertension: Secondary | ICD-10-CM | POA: Diagnosis not present

## 2017-09-09 DIAGNOSIS — Z992 Dependence on renal dialysis: Secondary | ICD-10-CM | POA: Diagnosis not present

## 2017-09-10 DIAGNOSIS — N2581 Secondary hyperparathyroidism of renal origin: Secondary | ICD-10-CM | POA: Diagnosis not present

## 2017-09-10 DIAGNOSIS — N186 End stage renal disease: Secondary | ICD-10-CM | POA: Diagnosis not present

## 2017-09-12 DIAGNOSIS — N2581 Secondary hyperparathyroidism of renal origin: Secondary | ICD-10-CM | POA: Diagnosis not present

## 2017-09-12 DIAGNOSIS — I10 Essential (primary) hypertension: Secondary | ICD-10-CM | POA: Diagnosis not present

## 2017-09-12 DIAGNOSIS — E118 Type 2 diabetes mellitus with unspecified complications: Secondary | ICD-10-CM | POA: Diagnosis not present

## 2017-09-12 DIAGNOSIS — Z6828 Body mass index (BMI) 28.0-28.9, adult: Secondary | ICD-10-CM | POA: Diagnosis not present

## 2017-09-12 DIAGNOSIS — E785 Hyperlipidemia, unspecified: Secondary | ICD-10-CM | POA: Diagnosis not present

## 2017-09-12 DIAGNOSIS — N185 Chronic kidney disease, stage 5: Secondary | ICD-10-CM | POA: Diagnosis not present

## 2017-09-12 DIAGNOSIS — M109 Gout, unspecified: Secondary | ICD-10-CM | POA: Diagnosis not present

## 2017-09-12 DIAGNOSIS — I770 Arteriovenous fistula, acquired: Secondary | ICD-10-CM | POA: Diagnosis not present

## 2017-09-12 DIAGNOSIS — E669 Obesity, unspecified: Secondary | ICD-10-CM | POA: Diagnosis not present

## 2017-09-13 DIAGNOSIS — N2581 Secondary hyperparathyroidism of renal origin: Secondary | ICD-10-CM | POA: Diagnosis not present

## 2017-09-13 DIAGNOSIS — N186 End stage renal disease: Secondary | ICD-10-CM | POA: Diagnosis not present

## 2017-09-14 DIAGNOSIS — N2889 Other specified disorders of kidney and ureter: Secondary | ICD-10-CM | POA: Diagnosis not present

## 2017-09-15 ENCOUNTER — Telehealth: Payer: Self-pay | Admitting: Podiatry

## 2017-09-15 DIAGNOSIS — N186 End stage renal disease: Secondary | ICD-10-CM | POA: Diagnosis not present

## 2017-09-15 DIAGNOSIS — N2581 Secondary hyperparathyroidism of renal origin: Secondary | ICD-10-CM | POA: Diagnosis not present

## 2017-09-15 NOTE — Telephone Encounter (Signed)
Pts insurance requires referral to our office fom doctor listed on the back of the card.Called patient and she states she does not see the doctor on the card that she see's Dr Jacelyn Grip and she called the insurance company and told them to change the doctor on the card but has not heard back. I asked her to call again and once changed to let me know so I can get a referral to our office.She is needing diabetic shoes.  Pt was seen 1 time already and referred by kidney center. She was scheduled to see [facility] tomorrow for diabetic shoe measurements

## 2017-09-16 ENCOUNTER — Other Ambulatory Visit: Payer: Medicare HMO | Admitting: Orthotics

## 2017-09-17 DIAGNOSIS — N186 End stage renal disease: Secondary | ICD-10-CM | POA: Diagnosis not present

## 2017-09-17 DIAGNOSIS — N2581 Secondary hyperparathyroidism of renal origin: Secondary | ICD-10-CM | POA: Diagnosis not present

## 2017-09-20 DIAGNOSIS — N2581 Secondary hyperparathyroidism of renal origin: Secondary | ICD-10-CM | POA: Diagnosis not present

## 2017-09-20 DIAGNOSIS — N186 End stage renal disease: Secondary | ICD-10-CM | POA: Diagnosis not present

## 2017-09-22 DIAGNOSIS — N2581 Secondary hyperparathyroidism of renal origin: Secondary | ICD-10-CM | POA: Diagnosis not present

## 2017-09-22 DIAGNOSIS — N186 End stage renal disease: Secondary | ICD-10-CM | POA: Diagnosis not present

## 2017-09-24 DIAGNOSIS — N2581 Secondary hyperparathyroidism of renal origin: Secondary | ICD-10-CM | POA: Diagnosis not present

## 2017-09-24 DIAGNOSIS — N186 End stage renal disease: Secondary | ICD-10-CM | POA: Diagnosis not present

## 2017-09-26 ENCOUNTER — Encounter: Payer: Self-pay | Admitting: Podiatry

## 2017-09-26 ENCOUNTER — Ambulatory Visit (INDEPENDENT_AMBULATORY_CARE_PROVIDER_SITE_OTHER): Payer: Medicare HMO | Admitting: Podiatry

## 2017-09-26 DIAGNOSIS — E0843 Diabetes mellitus due to underlying condition with diabetic autonomic (poly)neuropathy: Secondary | ICD-10-CM

## 2017-09-26 DIAGNOSIS — I70235 Atherosclerosis of native arteries of right leg with ulceration of other part of foot: Secondary | ICD-10-CM

## 2017-09-26 DIAGNOSIS — I70229 Atherosclerosis of native arteries of extremities with rest pain, unspecified extremity: Secondary | ICD-10-CM

## 2017-09-26 DIAGNOSIS — L97512 Non-pressure chronic ulcer of other part of right foot with fat layer exposed: Secondary | ICD-10-CM | POA: Diagnosis not present

## 2017-09-26 DIAGNOSIS — I998 Other disorder of circulatory system: Secondary | ICD-10-CM

## 2017-09-26 NOTE — Progress Notes (Signed)
SUBJECTIVE 72 year old female with a history of diabetes mellitus presents the office today for follow-up treatment and evaluation regarding an ulcer to the right great toe.  Patient is currently being managed for peripheral vascular disease by Dr. [name].  Patient does not know if there is been a change to the right great toe since last visit and she has been applying antibiotic ointment and a bandage daily.  She presents today for further treatment and evaluation  Past Medical History:  Diagnosis Date  . Arthritis   . Diabetes mellitus    diet controlled-  . Dizziness    occasionally  . ESRD (end stage renal disease) ([place])    TTHS, [place]  . History of colon polyps   . History of gout    takes Allopurinol daily  . Hyperlipidemia    takes Pravastatin daily  . Hypertension    takes Monopril daily  . Joint pain   . Patient is Jehovah's Witness    NO BLOOD OR BLOOD PRODUCTS. Albumin okay.   . Peripheral neuropathy   . Peripheral vascular disease ([place])   . Pneumonia    hx of-yrs ago  . Stroke [facility])     OBJECTIVE General Patient is awake, alert, and oriented x 3 and in no acute distress.  Derm Skin is dry and supple bilateral. Nails are tender, long, thickened and dystrophic with subungual debris, consistent with onychomycosis, 1-5 bilateral.   Wound #1 noted to the right great toe measuring 1.5 x 1.5 x 0.4 cm.   To the above-noted ulceration, there is no eschar. There is a moderate amount of slough, fibrin and necrotic tissue. Granulation tissue and wound base is red. There is no malodor. There is a minimal amount of serosanginous drainage noted. Periwound integrity is intact.   There appears to be a darker discoloration to the right great toe since last visit on 08/31/2017.  Vasc  DP and PT pedal pulses diminished bilaterally.  Skin is cooler to touch since last visit.  Neuro Epicritic and protective threshold sensation diminished bilaterally.   Musculoskeletal  Exam No symptomatic pedal deformities noted bilateral. Muscular strength within normal limits.  ASSESSMENT 1. Diabetes Mellitus w/ peripheral neuropathy 2. Onychomycosis of nail due to dermatophyte bilateral 3.  Critical limb ischemia right lower extremity  4. Ulceration of the right great toe secondary to diabetes mellitus and peripheral vascular disease  PLAN OF CARE 1. Patient evaluated today. 2. Medically necessary excisional debridement including subcutaneous tissue was performed using a tissue nipper and a chisel blade. Excisional debridement of all the necrotic nonviable tissue down to healthy bleeding viable tissue was performed with post-debridement measurements same as pre-. 5. The wound was cleansed and dry sterile dressing applied. 6.  At the moment I am going to recommend the patient apply Betadine and a Band-Aid daily to the right great toe. 7.  Reschedule appointment with Liliane Channel, Pedorthist diabetic shoes and insoles 8.  Today we are going to place orders for an arterial Doppler of the right lower extremity.  Pending arterial Doppler she will likely need to follow-up with Dr. Gwenlyn Found for vascular  Workup 9.  Return to clinic in 3 weeks  Edrick Kins, DPM [facility]  Dr. Edrick Kins, DPM    [address]  [place], [place] [address]                Office [phone]  Fax [phone]

## 2017-09-27 DIAGNOSIS — N2581 Secondary hyperparathyroidism of renal origin: Secondary | ICD-10-CM | POA: Diagnosis not present

## 2017-09-27 DIAGNOSIS — N186 End stage renal disease: Secondary | ICD-10-CM | POA: Diagnosis not present

## 2017-09-29 DIAGNOSIS — N2581 Secondary hyperparathyroidism of renal origin: Secondary | ICD-10-CM | POA: Diagnosis not present

## 2017-09-29 DIAGNOSIS — N186 End stage renal disease: Secondary | ICD-10-CM | POA: Diagnosis not present

## 2017-10-01 DIAGNOSIS — N186 End stage renal disease: Secondary | ICD-10-CM | POA: Diagnosis not present

## 2017-10-01 DIAGNOSIS — N2581 Secondary hyperparathyroidism of renal origin: Secondary | ICD-10-CM | POA: Diagnosis not present

## 2017-10-03 ENCOUNTER — Ambulatory Visit: Payer: Medicare HMO | Admitting: Orthotics

## 2017-10-03 DIAGNOSIS — Z992 Dependence on renal dialysis: Secondary | ICD-10-CM | POA: Diagnosis not present

## 2017-10-03 DIAGNOSIS — E1122 Type 2 diabetes mellitus with diabetic chronic kidney disease: Secondary | ICD-10-CM | POA: Diagnosis not present

## 2017-10-03 DIAGNOSIS — R0989 Other specified symptoms and signs involving the circulatory and respiratory systems: Secondary | ICD-10-CM

## 2017-10-03 DIAGNOSIS — N186 End stage renal disease: Secondary | ICD-10-CM | POA: Diagnosis not present

## 2017-10-03 DIAGNOSIS — E1151 Type 2 diabetes mellitus with diabetic peripheral angiopathy without gangrene: Secondary | ICD-10-CM

## 2017-10-03 DIAGNOSIS — L97512 Non-pressure chronic ulcer of other part of right foot with fat layer exposed: Secondary | ICD-10-CM

## 2017-10-03 DIAGNOSIS — L97521 Non-pressure chronic ulcer of other part of left foot limited to breakdown of skin: Secondary | ICD-10-CM

## 2017-10-03 NOTE — Progress Notes (Signed)
Patient presents today for diabetic shoe measurement and foam casting.  Goals of diabetic shoes/inserts to offer protection from conditions secondary to DM2, offer relief from sheer forces that could lead to ulcerations, protect the foot, and offer greater stability. Patient is under supervision of DPM EVANS  Physician managing patients DM2: Yaakov Guthrie Patient has following documented conditions to qualify for diabetic shoes/inserts: Dm2, PN, hx ulcer hallux rt Patient measured with brannock device:  68M

## 2017-10-04 DIAGNOSIS — N186 End stage renal disease: Secondary | ICD-10-CM | POA: Diagnosis not present

## 2017-10-04 DIAGNOSIS — N2581 Secondary hyperparathyroidism of renal origin: Secondary | ICD-10-CM | POA: Diagnosis not present

## 2017-10-06 DIAGNOSIS — N2581 Secondary hyperparathyroidism of renal origin: Secondary | ICD-10-CM | POA: Diagnosis not present

## 2017-10-06 DIAGNOSIS — N186 End stage renal disease: Secondary | ICD-10-CM | POA: Diagnosis not present

## 2017-10-08 DIAGNOSIS — N2581 Secondary hyperparathyroidism of renal origin: Secondary | ICD-10-CM | POA: Diagnosis not present

## 2017-10-08 DIAGNOSIS — N186 End stage renal disease: Secondary | ICD-10-CM | POA: Diagnosis not present

## 2017-10-11 ENCOUNTER — Other Ambulatory Visit: Payer: Self-pay

## 2017-10-11 ENCOUNTER — Emergency Department (HOSPITAL_COMMUNITY)
Admission: EM | Admit: 2017-10-11 | Discharge: 2017-10-11 | Disposition: A | Discharge status: Home / Self Care | Payer: Medicare HMO | Attending: Emergency Medicine | Admitting: Emergency Medicine

## 2017-10-11 ENCOUNTER — Emergency Department (HOSPITAL_COMMUNITY): Payer: Medicare HMO

## 2017-10-11 ENCOUNTER — Encounter (HOSPITAL_COMMUNITY): Payer: Self-pay | Admitting: Emergency Medicine

## 2017-10-11 DIAGNOSIS — M86679 Other chronic osteomyelitis, unspecified ankle and foot: Secondary | ICD-10-CM | POA: Diagnosis not present

## 2017-10-11 DIAGNOSIS — Z7982 Long term (current) use of aspirin: Secondary | ICD-10-CM | POA: Diagnosis not present

## 2017-10-11 DIAGNOSIS — Z79899 Other long term (current) drug therapy: Secondary | ICD-10-CM | POA: Diagnosis not present

## 2017-10-11 DIAGNOSIS — X58XXXA Exposure to other specified factors, initial encounter: Secondary | ICD-10-CM | POA: Diagnosis not present

## 2017-10-11 DIAGNOSIS — S91101A Unspecified open wound of right great toe without damage to nail, initial encounter: Secondary | ICD-10-CM | POA: Diagnosis not present

## 2017-10-11 DIAGNOSIS — E1151 Type 2 diabetes mellitus with diabetic peripheral angiopathy without gangrene: Secondary | ICD-10-CM | POA: Insufficient documentation

## 2017-10-11 DIAGNOSIS — N186 End stage renal disease: Secondary | ICD-10-CM | POA: Diagnosis not present

## 2017-10-11 DIAGNOSIS — S91101D Unspecified open wound of right great toe without damage to nail, subsequent encounter: Secondary | ICD-10-CM | POA: Diagnosis not present

## 2017-10-11 DIAGNOSIS — Z87891 Personal history of nicotine dependence: Secondary | ICD-10-CM | POA: Insufficient documentation

## 2017-10-11 DIAGNOSIS — S79101A Unspecified physeal fracture of lower end of right femur, initial encounter for closed fracture: Secondary | ICD-10-CM | POA: Diagnosis not present

## 2017-10-11 DIAGNOSIS — R52 Pain, unspecified: Secondary | ICD-10-CM

## 2017-10-11 DIAGNOSIS — Y929 Unspecified place or not applicable: Secondary | ICD-10-CM | POA: Diagnosis not present

## 2017-10-11 DIAGNOSIS — Z992 Dependence on renal dialysis: Secondary | ICD-10-CM | POA: Diagnosis not present

## 2017-10-11 DIAGNOSIS — Y939 Activity, unspecified: Secondary | ICD-10-CM | POA: Insufficient documentation

## 2017-10-11 DIAGNOSIS — E1140 Type 2 diabetes mellitus with diabetic neuropathy, unspecified: Secondary | ICD-10-CM | POA: Insufficient documentation

## 2017-10-11 DIAGNOSIS — I12 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 stage renal disease: Secondary | ICD-10-CM | POA: Insufficient documentation

## 2017-10-11 DIAGNOSIS — M869 Osteomyelitis, unspecified: Secondary | ICD-10-CM | POA: Insufficient documentation

## 2017-10-11 DIAGNOSIS — N2581 Secondary hyperparathyroidism of renal origin: Secondary | ICD-10-CM | POA: Diagnosis not present

## 2017-10-11 DIAGNOSIS — Y999 Unspecified external cause status: Secondary | ICD-10-CM | POA: Insufficient documentation

## 2017-10-11 DIAGNOSIS — Z7984 Long term (current) use of oral hypoglycemic drugs: Secondary | ICD-10-CM | POA: Diagnosis not present

## 2017-10-11 DIAGNOSIS — E1122 Type 2 diabetes mellitus with diabetic chronic kidney disease: Secondary | ICD-10-CM | POA: Diagnosis not present

## 2017-10-11 DIAGNOSIS — T1490XA Injury, unspecified, initial encounter: Secondary | ICD-10-CM | POA: Diagnosis not present

## 2017-10-11 DIAGNOSIS — S91109D Unspecified open wound of unspecified toe(s) without damage to nail, subsequent encounter: Secondary | ICD-10-CM

## 2017-10-11 LAB — CBC WITH DIFFERENTIAL/PLATELET
Basophils Absolute: 0 10*3/uL (ref 0.0–0.1)
Basophils Relative: 0 %
Eosinophils Absolute: 0.4 10*3/uL (ref 0.0–0.7)
Eosinophils Relative: 4 %
HCT: 35.8 % — ABNORMAL LOW (ref 36.0–46.0)
Hemoglobin: 11.2 g/dL — ABNORMAL LOW (ref 12.0–15.0)
Lymphocytes Relative: 20 %
Lymphs Abs: 2.2 10*3/uL (ref 0.7–4.0)
MCH: 31.1 pg (ref 26.0–34.0)
MCHC: 31.3 g/dL (ref 30.0–36.0)
MCV: 99.4 fL (ref 78.0–100.0)
Monocytes Absolute: 0.8 10*3/uL (ref 0.1–1.0)
Monocytes Relative: 7 %
Neutro Abs: 7.4 10*3/uL (ref 1.7–7.7)
Neutrophils Relative %: 69 %
Platelets: 278 10*3/uL (ref 150–400)
RBC: 3.6 MIL/uL — ABNORMAL LOW (ref 3.87–5.11)
RDW: 15.8 % — ABNORMAL HIGH (ref 11.5–15.5)
WBC: 10.8 10*3/uL — ABNORMAL HIGH (ref 4.0–10.5)

## 2017-10-11 LAB — BASIC METABOLIC PANEL
Anion gap: 11 (ref 5–15)
BUN: 9 mg/dL (ref 6–20)
CO2: 30 mmol/L (ref 22–32)
Calcium: 9.3 mg/dL (ref 8.9–10.3)
Chloride: 94 mmol/L — ABNORMAL LOW (ref 101–111)
Creatinine, Ser: 3.02 mg/dL — ABNORMAL HIGH (ref 0.44–1.00)
GFR calc Af Amer: 17 mL/min — ABNORMAL LOW (ref >60)
GFR calc non Af Amer: 14 mL/min — ABNORMAL LOW (ref >60)
Glucose, Bld: 112 mg/dL — ABNORMAL HIGH (ref 65–99)
Potassium: 3.6 mmol/L (ref 3.5–5.1)
Sodium: 135 mmol/L (ref 135–145)

## 2017-10-11 NOTE — ED Notes (Signed)
Patient transported to X-ray 

## 2017-10-11 NOTE — ED Notes (Signed)
Attempted to draw blood x2, alerted phlebotomy, EMT also attempted without success

## 2017-10-11 NOTE — ED Triage Notes (Signed)
Per EMS pt from home R toe wound for 2 months, dialysis doctor looked at it and was brought here for gangrene toe. Pt is diabetic and has HTN

## 2017-10-11 NOTE — Discharge Instructions (Addendum)
Follow-up with Dr. Amalia Hailey tomorrow at 11:15 AM as scheduled.  Keep the wound on your toe covered with a gauze bandage.

## 2017-10-11 NOTE — ED Provider Notes (Signed)
[facility] EMERGENCY DEPARTMENT Provider Note   CSN: [id number] Arrival date & time: 10/11/17  1736     History   Chief Complaint Chief Complaint  Patient presents with  . Wound Infection    HPI Joanne Carlson is a 72 y.o. female.  She presents for evaluation of a sore on her right first toe present for 2 months.  She has been evaluated by podiatry twice last about 1 week ago.  She has had at least 2 debridement procedures.  She is due to follow-up with the podiatrist next week.  Today a provider at her dialysis session thought she should come to get the wound evaluated.  She complains of mild pain in the right foot and right ankle.  She denies fever, chills, nausea, vomiting, weakness or dizziness.  She is taking her dialysis 3 times a week as usual.  There are no other known modifying factors.  HPI  Past Medical History:  Diagnosis Date  . Arthritis   . Diabetes mellitus    diet controlled-  . Dizziness    occasionally  . ESRD (end stage renal disease) ([place])    TTHS, [place]  . History of colon polyps   . History of gout    takes Allopurinol daily  . Hyperlipidemia    takes Pravastatin daily  . Hypertension    takes Monopril daily  . Joint pain   . Patient is Jehovah's Witness    NO BLOOD OR BLOOD PRODUCTS. Albumin okay.   . Peripheral neuropathy   . Peripheral vascular disease ([place])   . Pneumonia    hx of-yrs ago  . Stroke [facility])     Patient Active Problem List   Diagnosis Date Noted  . Critical lower limb ischemia 01/07/2017  . Chronic renal insufficiency, stage IV (severe) ([place]) 11/13/2013  . End stage renal disease ([place]) 07/20/2013  . Mechanical complication of other vascular device, implant, and graft 07/20/2013  . HYPERCHOLESTEROLEMIA 08/07/2008  . ANEMIA 08/07/2008  . RENAL INSUFFICIENCY 08/07/2008  . ALLERGIC RHINITIS 03/04/2008  . DIABETES MELLITUS, TYPE II 03/15/2007  . HYPERTENSION, BENIGN 03/15/2007    Past Surgical  History:  Procedure Laterality Date  . APPENDECTOMY    . BASCILIC VEIN TRANSPOSITION Right 10/03/2013   Procedure: BRACHIOCEPHALIC Arteriovenous Fistula ;  Surgeon: Mal Misty, MD;  Location: [facility];  Service: Vascular;  Laterality: Right;  . BASCILIC VEIN TRANSPOSITION Right 11/17/2016   Procedure: BASCILIC VEIN TRANSPOSITION- RIGHT SINGLE STAGE;  Surgeon: Conrad Vandiver, MD;  Location: [facility];  Service: Vascular;  Laterality: Right;  . COLONOSCOPY    . cyst removed from lower abdomen  80's  . ESOPHAGOGASTRODUODENOSCOPY    . rotator cuff surgery Right 2013  . TUBAL LIGATION    . UPPER EXTREMITY VENOGRAPHY Bilateral 11/08/2016   Procedure: Bilateral Upper Extremity Venography;  Surgeon: Conrad Carlyle, MD;  Location: [place] CV LAB;  Service: Cardiovascular;  Laterality: Bilateral;     OB History   None      Home Medications    Prior to Admission medications   Medication Sig Start Date End Date Taking? Authorizing Provider  allopurinol (ZYLOPRIM) 100 MG tablet Take 200 mg by mouth daily.  05/25/13   [provider]  aspirin EC 81 MG tablet Take 81 mg by mouth daily.    [provider]  calcium acetate (PHOSLO) 667 MG capsule Take 667-1,334 mg by mouth See admin instructions. Take 2 caps three times a day  with meals. Take 1 cap with snacks twice daily. 06/22/13   [provider]  cinacalcet (SENSIPAR) 30 MG tablet Take 30 mg by mouth daily.    [provider]  fosinopril (MONOPRIL) 40 MG tablet Take 40 mg by mouth 2 (two) times daily. 09/28/16   [provider]  furosemide (LASIX) 80 MG tablet Take by mouth.    [provider]  gabapentin (NEURONTIN) 100 MG capsule Take 1 capsule by mouth every morning and 1 capsule at bedtime. PATIENT NEEDS OFFICE VISIT FOR ADDITIONAL REFILLS Patient taking differently: Take 100 mg by mouth daily as needed (pain).  05/25/13   Collene Leyden, PA-C  glipiZIDE (GLUCOTROL XL) 10 MG 24 hr tablet Take  by mouth.    [provider]  glucose blood test strip Use to test blood sugar daily. Dx code: 250.02. 11/06/12   Theda Sers, PA-C  Lancets MISC Use to test blood sugar daily. Dx code 64.02. 11/08/12   Collene Leyden, PA-C  pravastatin (PRAVACHOL) 20 MG tablet Take 20 mg by mouth at bedtime.     [provider]    Family History Family History  Problem Relation Age of Onset  . Depression Mother   . Hypertension Mother   . Depression Father   . Depression Sister   . Hypertension Sister     Social History Social History   Tobacco Use  . Smoking status: Former Smoker    Years: 10.00    Types: Cigarettes  . Smokeless tobacco: Never Used  . Tobacco comment: quit smoking over 35+yrs ago  Substance Use Topics  . Alcohol use: No  . Drug use: No     Allergies   Tylenol [acetaminophen]   Review of Systems Review of Systems  All other systems reviewed and are negative.    Physical Exam Updated Vital Signs BP 129/63 (BP Location: Left Arm)   Pulse 88   Temp 98 F (36.7 C) (Oral)   Resp 16   Ht 5\' 5"  (1.651 m)   Wt 73.5 kg (162 lb)   SpO2 95%   BMI 26.96 kg/m   Physical Exam  Constitutional: She is oriented to person, place, and time. She appears well-developed and well-nourished.  HENT:  Head: Normocephalic and atraumatic.  Eyes: Pupils are equal, round, and reactive to light. Conjunctivae and EOM are normal.  Neck: Normal range of motion and phonation normal. Neck supple.  Cardiovascular: Normal rate.  Pulmonary/Chest: Effort normal. She exhibits no tenderness.  Musculoskeletal: Normal range of motion. She exhibits no edema or deformity.   No tenderness of the right foot or right leg.  Neurological: She is alert and oriented to person, place, and time. She exhibits normal muscle tone.  Skin: Skin is warm and dry.  Right great toe with eschar of the distal tip approximately 2 x 3 cm.  No associated bleeding, drainage, fluctuance or  erythema.   Psychiatric: She has a normal mood and affect. Her behavior is normal. Judgment and thought content normal.  Nursing note and vitals reviewed.    ED Treatments / Results  Labs (all labs ordered are listed, but only abnormal results are displayed) Labs Reviewed - No data to display  EKG None  Radiology No results found.  Procedures Procedures (including critical care time)  Medications Ordered in ED Medications - No data to display   Initial Impression / Assessment and Plan / ED Course  I have reviewed the triage vital signs and the nursing  notes.  Pertinent labs & imaging results that were available during my care of the patient were reviewed by me and considered in my medical decision making (see chart for details).      Patient Vitals for the past 24 hrs:  BP Temp Temp src Pulse Resp SpO2 Height Weight  10/11/17 1743 - - - - - - 5\' 5"  (1.651 m) 73.5 kg (162 lb)  10/11/17 1740 129/63 98 F (36.7 C) Oral 88 16 95 % - -    9:25 PM Reevaluation with update and discussion. After initial assessment and treatment, an updated evaluation reveals she remains comfortable and has no further complaints.  She now states that she has a follow-up appointment with her podiatrist, tomorrow morning.  Findings discussed and questions answered. Daleen Bo   Medical decision making-right first toe infection, with signs of osteomyelitis on plain film imaging.  Screening labs are reassuring.  Patient has short-term follow-up planned with her podiatrist in about 15 hours.  No indication for admission at this time.  She is stable for outpatient evaluation and treatment.  Doubt sepsis, metabolic instability or impending vascular collapse.  Nursing Notes Reviewed/ Care Coordinated Applicable Imaging Reviewed Interpretation of Laboratory Data incorporated into ED treatment  The patient appears reasonably screened and/or stabilized for discharge and I doubt any other medical  condition or other [facility] requiring further screening, evaluation, or treatment in the ED at this time prior to discharge.  Plan: Home Medications-continue current medications; Home Treatments-continue current wound care treatments; return here if the recommended treatment, does not improve the symptoms; Recommended follow up-follow-up with podiatry tomorrow morning.    Final Clinical Impressions(s) / ED Diagnoses   Final diagnoses:  Open wound of toe, subsequent encounter  Osteomyelitis of great toe of right foot [facility])    ED Discharge Orders    None       Daleen Bo, MD 10/14/17 [phone]

## 2017-10-12 ENCOUNTER — Telehealth: Payer: Self-pay | Admitting: *Deleted

## 2017-10-12 ENCOUNTER — Ambulatory Visit (INDEPENDENT_AMBULATORY_CARE_PROVIDER_SITE_OTHER): Payer: Medicare HMO | Admitting: Podiatry

## 2017-10-12 ENCOUNTER — Encounter: Payer: Self-pay | Admitting: Podiatry

## 2017-10-12 DIAGNOSIS — I70235 Atherosclerosis of native arteries of right leg with ulceration of other part of foot: Secondary | ICD-10-CM | POA: Diagnosis not present

## 2017-10-12 DIAGNOSIS — L97512 Non-pressure chronic ulcer of other part of right foot with fat layer exposed: Secondary | ICD-10-CM | POA: Diagnosis not present

## 2017-10-12 DIAGNOSIS — E1151 Type 2 diabetes mellitus with diabetic peripheral angiopathy without gangrene: Secondary | ICD-10-CM

## 2017-10-12 DIAGNOSIS — I96 Gangrene, not elsewhere classified: Secondary | ICD-10-CM

## 2017-10-12 DIAGNOSIS — R0989 Other specified symptoms and signs involving the circulatory and respiratory systems: Secondary | ICD-10-CM

## 2017-10-12 NOTE — Telephone Encounter (Signed)
Faxed referral, clinicals and demographics to [facility] as URGENT.

## 2017-10-12 NOTE — Progress Notes (Signed)
SUBJECTIVE 72 year old female with a history of diabetes mellitus and critical limb ischemia presents the office today for follow-up treatment and evaluation regarding an ulcer to the right great toe.  Patient states that the toe has darkened since the last visit and she believes it looks worse.  She was not contacted regarding arterial studies which were supposed to be ordered on last visit.  She has not had any follow-up at the moment with vascular regarding her acute changes to the ischemia of the right great toe.  She has been applying Betadine daily to the toe.  She went to the emergency department yesterday as per instructions from her endocrinologist, and she was subsequently discharged. She presents today for further treatment and evaluation  Past Medical History:  Diagnosis Date  . Arthritis   . Diabetes mellitus    diet controlled-  . Dizziness    occasionally  . ESRD (end stage renal disease) ([place])    TTHS, [place]  . History of colon polyps   . History of gout    takes Allopurinol daily  . Hyperlipidemia    takes Pravastatin daily  . Hypertension    takes Monopril daily  . Joint pain   . Patient is Jehovah's Witness    NO BLOOD OR BLOOD PRODUCTS. Albumin okay.   . Peripheral neuropathy   . Peripheral vascular disease ([place])   . Pneumonia    hx of-yrs ago  . Stroke [facility])     OBJECTIVE General Patient is awake, alert, and oriented x 3 and in no acute distress.  Derm Skin is dry and supple bilateral. Nails are tender, long, thickened and dystrophic with subungual debris, consistent with onychomycosis, 1-5 bilateral.   Wound #1 noted to the right great toe measuring 1.8 x 1.8 x 0.2 cm.   To the above-noted ulceration, there is an overlying eschar. There is a moderate amount of slough, fibrin and necrotic tissue. Granulation tissue and wound base is red. There is no malodor. There is currently no drainage. Periwound integrity is intact.   There appears to be a darker  discoloration to the right great toe since last visit.  Vasc  DP and PT pedal pulses diminished bilaterally, however they do appear to be slightly palpable.  Skin is cooler to touch since last visit.  Neuro Epicritic and protective threshold sensation diminished bilaterally.   Musculoskeletal Exam No symptomatic pedal deformities noted bilateral. Muscular strength within normal limits.  ASSESSMENT 1. Diabetes Mellitus w/ peripheral neuropathy 2. Critical limb ischemia right lower extremity  3. Ulceration of the right great toe secondary to diabetes mellitus and peripheral vascular disease  PLAN OF CARE 1. Patient evaluated today. 2. Medically necessary excisional debridement including subcutaneous tissue was performed using a tissue nipper and a chisel blade. Excisional debridement of all the necrotic nonviable tissue down to healthy bleeding viable tissue was performed with post-debridement measurements same as pre-. 5. The wound was cleansed and dry sterile dressing applied. 6.  Silvadene cream was provided for the patient to apply daily with dry sterile dressing 7.  Today we are going to reorder arterial and venous dopplers of the right lower extremity.  Appointment referral placed today with Dr. [name], to reevaluate vascular status since it is been greater than 6 months since last appointment with him 9.  Return to clinic in 3 weeks  Edrick Kins, DPM [facility]  Dr. Edrick Kins, DPM    [address]  [place], [place] [address]                Office [phone]  Fax [phone]

## 2017-10-12 NOTE — Telephone Encounter (Signed)
Orders faxed to [facility]. 

## 2017-10-12 NOTE — Telephone Encounter (Signed)
-----   Message from Tomi Bamberger, [place] sent at 10/12/2017 10:50 AM EDT ----- Will you please order a Arterial Doppler on right lower extremity for patient.   Thanks.

## 2017-10-12 NOTE — Telephone Encounter (Signed)
Referral faxed to [facility] - Dr. Gwenlyn Found.

## 2017-10-12 NOTE — Telephone Encounter (Signed)
-----   Message from Edrick Kins, DPM sent at 10/12/2017 11:03 AM EDT ----- Regarding: dopplers and appt with Dr. Gwenlyn Found Please order arterial and venous Doppler right lower extremity.  Also place a referral appointment with Dr. [name].    diagnosis: Critical limb ischemia right lower extremity.  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    Thanks, Dr. Amalia Hailey

## 2017-10-13 DIAGNOSIS — N186 End stage renal disease: Secondary | ICD-10-CM | POA: Diagnosis not present

## 2017-10-13 DIAGNOSIS — N2581 Secondary hyperparathyroidism of renal origin: Secondary | ICD-10-CM | POA: Diagnosis not present

## 2017-10-15 DIAGNOSIS — N186 End stage renal disease: Secondary | ICD-10-CM | POA: Diagnosis not present

## 2017-10-15 DIAGNOSIS — N2581 Secondary hyperparathyroidism of renal origin: Secondary | ICD-10-CM | POA: Diagnosis not present

## 2017-10-17 ENCOUNTER — Ambulatory Visit: Payer: Medicare HMO | Admitting: Podiatry

## 2017-10-17 ENCOUNTER — Ambulatory Visit (HOSPITAL_COMMUNITY)
Admission: RE | Admit: 2017-10-17 | Payer: Medicare HMO | Source: Ambulatory Visit | Attending: Podiatry | Admitting: Podiatry

## 2017-10-18 DIAGNOSIS — N2581 Secondary hyperparathyroidism of renal origin: Secondary | ICD-10-CM | POA: Diagnosis not present

## 2017-10-18 DIAGNOSIS — N186 End stage renal disease: Secondary | ICD-10-CM | POA: Diagnosis not present

## 2017-10-19 ENCOUNTER — Other Ambulatory Visit: Payer: Self-pay

## 2017-10-19 ENCOUNTER — Encounter (HOSPITAL_COMMUNITY): Payer: Self-pay | Admitting: Cardiovascular Disease

## 2017-10-20 DIAGNOSIS — N2581 Secondary hyperparathyroidism of renal origin: Secondary | ICD-10-CM | POA: Diagnosis not present

## 2017-10-20 DIAGNOSIS — N186 End stage renal disease: Secondary | ICD-10-CM | POA: Diagnosis not present

## 2017-10-22 DIAGNOSIS — N2581 Secondary hyperparathyroidism of renal origin: Secondary | ICD-10-CM | POA: Diagnosis not present

## 2017-10-22 DIAGNOSIS — N186 End stage renal disease: Secondary | ICD-10-CM | POA: Diagnosis not present

## 2017-10-25 DIAGNOSIS — N186 End stage renal disease: Secondary | ICD-10-CM | POA: Diagnosis not present

## 2017-10-25 DIAGNOSIS — N2581 Secondary hyperparathyroidism of renal origin: Secondary | ICD-10-CM | POA: Diagnosis not present

## 2017-10-26 ENCOUNTER — Other Ambulatory Visit: Payer: Self-pay | Admitting: Podiatry

## 2017-10-26 DIAGNOSIS — R0989 Other specified symptoms and signs involving the circulatory and respiratory systems: Secondary | ICD-10-CM

## 2017-10-27 DIAGNOSIS — N186 End stage renal disease: Secondary | ICD-10-CM | POA: Diagnosis not present

## 2017-10-27 DIAGNOSIS — N2581 Secondary hyperparathyroidism of renal origin: Secondary | ICD-10-CM | POA: Diagnosis not present

## 2017-10-28 ENCOUNTER — Ambulatory Visit (HOSPITAL_COMMUNITY)
Admission: RE | Admit: 2017-10-28 | Discharge: 2017-10-28 | Disposition: A | Discharge status: Home / Self Care | Payer: Medicare HMO | Source: Ambulatory Visit | Attending: Internal Medicine | Admitting: Internal Medicine

## 2017-10-28 DIAGNOSIS — E785 Hyperlipidemia, unspecified: Secondary | ICD-10-CM | POA: Diagnosis not present

## 2017-10-28 DIAGNOSIS — E119 Type 2 diabetes mellitus without complications: Secondary | ICD-10-CM | POA: Diagnosis not present

## 2017-10-28 DIAGNOSIS — R0989 Other specified symptoms and signs involving the circulatory and respiratory systems: Secondary | ICD-10-CM | POA: Insufficient documentation

## 2017-10-28 DIAGNOSIS — E1140 Type 2 diabetes mellitus with diabetic neuropathy, unspecified: Secondary | ICD-10-CM | POA: Diagnosis not present

## 2017-10-28 DIAGNOSIS — E1151 Type 2 diabetes mellitus with diabetic peripheral angiopathy without gangrene: Secondary | ICD-10-CM | POA: Diagnosis not present

## 2017-10-28 DIAGNOSIS — I10 Essential (primary) hypertension: Secondary | ICD-10-CM | POA: Diagnosis not present

## 2017-10-28 DIAGNOSIS — Z87891 Personal history of nicotine dependence: Secondary | ICD-10-CM | POA: Insufficient documentation

## 2017-10-28 DIAGNOSIS — N2581 Secondary hyperparathyroidism of renal origin: Secondary | ICD-10-CM | POA: Diagnosis not present

## 2017-10-28 DIAGNOSIS — N186 End stage renal disease: Secondary | ICD-10-CM | POA: Diagnosis not present

## 2017-10-28 DIAGNOSIS — Z6831 Body mass index (BMI) 31.0-31.9, adult: Secondary | ICD-10-CM | POA: Diagnosis not present

## 2017-10-28 DIAGNOSIS — Z992 Dependence on renal dialysis: Secondary | ICD-10-CM | POA: Diagnosis not present

## 2017-10-28 DIAGNOSIS — E1122 Type 2 diabetes mellitus with diabetic chronic kidney disease: Secondary | ICD-10-CM | POA: Diagnosis not present

## 2017-10-28 DIAGNOSIS — I509 Heart failure, unspecified: Secondary | ICD-10-CM | POA: Diagnosis not present

## 2017-10-28 DIAGNOSIS — I132 Hypertensive heart and chronic kidney disease with heart failure and with stage 5 chronic kidney disease, or end stage renal disease: Secondary | ICD-10-CM | POA: Diagnosis not present

## 2017-10-29 DIAGNOSIS — N2581 Secondary hyperparathyroidism of renal origin: Secondary | ICD-10-CM | POA: Diagnosis not present

## 2017-10-29 DIAGNOSIS — N186 End stage renal disease: Secondary | ICD-10-CM | POA: Diagnosis not present

## 2017-11-01 ENCOUNTER — Ambulatory Visit (INDEPENDENT_AMBULATORY_CARE_PROVIDER_SITE_OTHER): Payer: Medicare HMO | Admitting: Cardiovascular Disease

## 2017-11-01 ENCOUNTER — Encounter: Payer: Self-pay | Admitting: Cardiovascular Disease

## 2017-11-01 VITALS — BP 124/52 | HR 79 | Ht 64.0 in | Wt 169.0 lb

## 2017-11-01 DIAGNOSIS — I998 Other disorder of circulatory system: Secondary | ICD-10-CM

## 2017-11-01 DIAGNOSIS — E7800 Pure hypercholesterolemia, unspecified: Secondary | ICD-10-CM

## 2017-11-01 DIAGNOSIS — I10 Essential (primary) hypertension: Secondary | ICD-10-CM

## 2017-11-01 DIAGNOSIS — N2581 Secondary hyperparathyroidism of renal origin: Secondary | ICD-10-CM | POA: Diagnosis not present

## 2017-11-01 DIAGNOSIS — N186 End stage renal disease: Secondary | ICD-10-CM | POA: Diagnosis not present

## 2017-11-01 DIAGNOSIS — I70229 Atherosclerosis of native arteries of extremities with rest pain, unspecified extremity: Secondary | ICD-10-CM

## 2017-11-01 NOTE — Patient Instructions (Signed)
Medication Instructions: Your physician recommends that you continue on your current medications as directed. Please refer to the Current Medication list given to you today.   Follow-Up: Your physician recommends that you schedule a follow-up appointment in: 3 months with Dr. Gwenlyn Found.   Any Other Special Instructions are listed below:  Please call our office [phone] when you are ready to schedule your procedure. Ask for Lovena Le, CMA.

## 2017-11-01 NOTE — Assessment & Plan Note (Signed)
History of hyperlipidemia on statin therapy followed by her PCP. 

## 2017-11-01 NOTE — Assessment & Plan Note (Signed)
History of critical limb ischemia with a left great toe ulcer which has subsequently healed.  She now has a right great toe ulcer with Dopplers that show an occluded right SFA.  She is on hemodialysis.  She will need angiography and potential intervention mention for limb preservation.

## 2017-11-01 NOTE — Assessment & Plan Note (Signed)
History of essential hypertension her blood pressure measured at 124/52.  She is on Monopril.  Continue current meds at current dosing.

## 2017-11-01 NOTE — Progress Notes (Signed)
11/01/2017 Joanne Carlson   Feb 25, 1946  [id number]  Primary Physician Vernie Shanks, MD Primary Cardiologist: Lorretta Harp MD [name], [place]  HPI:  Joanne Carlson is a 72 y.o.  mother of 2 children referred by Dr. Amalia Hailey for peripheral vascular evaluation because of critical limb ischemia.  Last saw her in the office 02/25/2017.  She has a history of treated hypertension, diabetes and hyperlipidemia. 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 Dr. Adele Barthel placed an AV fistula. She's never had a heart attack or stroke. She denies chest pain or shortness of breath. She's had a small non-healing ulcer on her left great toe treated with antibiotics recently and according to her this is slowly healing. Lower extremity arterial Doppler studies performed in our office 01/03/17 revealed ABIs approximately 0.8 bilaterally with an occluded distal right SFA and high-grade left SFA stenosis with three-vessel runoff.  Since I saw her approximately 9 months ago her left great toe ulcer has healed however she is developed a gangrenous right great toe.  I believe she would need percutaneous revascularization to facilitate wound healing.    Current Meds  Medication Sig  . allopurinol (ZYLOPRIM) 100 MG tablet Take 200 mg by mouth daily.   Marland Kitchen aspirin EC 81 MG tablet Take 81 mg by mouth daily.  . calcium acetate (PHOSLO) 667 MG capsule Take 667-1,334 mg by mouth See admin instructions. Take 2 caps three times a day with meals. Take 1 cap with snacks twice daily.  . cinacalcet (SENSIPAR) 30 MG tablet Take 30 mg by mouth daily.  . fosinopril (MONOPRIL) 40 MG tablet Take 40 mg by mouth 2 (two) times daily.  . furosemide (LASIX) 80 MG tablet Take by mouth.  . gabapentin (NEURONTIN) 100 MG capsule Take 1 capsule by mouth every morning and 1 capsule at bedtime. PATIENT NEEDS OFFICE VISIT FOR ADDITIONAL REFILLS (Patient taking differently: Take 100 mg by mouth  daily as needed (pain). )  . glipiZIDE (GLUCOTROL XL) 10 MG 24 hr tablet Take by mouth.  Marland Kitchen glucose blood test strip Use to test blood sugar daily. Dx code: 55.02.  Marland Kitchen Lancets MISC Use to test blood sugar daily. Dx code 250.02.  . pravastatin (PRAVACHOL) 20 MG tablet Take 20 mg by mouth at bedtime.      Allergies  Allergen Reactions  . Tylenol [Acetaminophen] Rash    Social History   Socioeconomic History  . Marital status: Widowed    Spouse name: Not on file  . Number of children: Not on file  . Years of education: Not on file  . Highest education level: Not on file  Occupational History  . Not on file  Social Needs  . Financial resource strain: Not on file  . Food insecurity:    Worry: Not on file    Inability: Not on file  . Transportation needs:    Medical: Not on file    Non-medical: Not on file  Tobacco Use  . Smoking status: Former Smoker    Years: 10.00    Types: Cigarettes  . Smokeless tobacco: Never Used  . Tobacco comment: quit smoking over 35+yrs ago  Substance and Sexual Activity  . Alcohol use: No  . Drug use: No  . Sexual activity: Never    Birth control/protection: Post-menopausal  Lifestyle  . Physical activity:    Days per week: Not on file    Minutes per session: Not on file  .  Stress: Not on file  Relationships  . Social connections:    Talks on phone: Not on file    Gets together: Not on file    Attends religious service: Not on file    Active member of club or organization: Not on file    Attends meetings of clubs or organizations: Not on file    Relationship status: Not on file  . Intimate partner violence:    Fear of current or ex partner: Not on file    Emotionally abused: Not on file    Physically abused: Not on file    Forced sexual activity: Not on file  Other Topics Concern  . Not on file  Social History Narrative  . Not on file     Review of Systems: General: negative for chills, fever, night sweats or weight changes.    Cardiovascular: negative for chest pain, dyspnea on exertion, edema, orthopnea, palpitations, paroxysmal nocturnal dyspnea or shortness of breath Dermatological: negative for rash Respiratory: negative for cough or wheezing Urologic: negative for hematuria Abdominal: negative for nausea, vomiting, diarrhea, bright red blood per rectum, melena, or hematemesis Neurologic: negative for visual changes, syncope, or dizziness All other systems reviewed and are otherwise negative except as noted above.    Blood pressure (!) 124/52, pulse 79, height 5\' 4"  (1.626 m), weight 169 lb (76.7 kg).  General appearance: alert and no distress Neck: no adenopathy, no JVD, supple, symmetrical, trachea midline, thyroid not enlarged, symmetric, no tenderness/mass/nodules and Bilateral carotid bruits Lungs: clear to auscultation bilaterally Heart: Soft outflow tract murmur Extremities: Gangrenous right great toe Pulses: Absent pedal pulses Skin: Right great toe gangrene Neurologic: Alert and oriented X 3, normal strength and tone. Normal symmetric reflexes. Normal coordination and gait  EKG normal sinus rhythm at 79 with low limb voltage and poor R wave progression.  I personally reviewed this EKG.  ASSESSMENT AND PLAN:   HYPERCHOLESTEROLEMIA History of hyperlipidemia on statin therapy followed by her PCP  HYPERTENSION, BENIGN History of essential hypertension her blood pressure measured at 124/52.  She is on Monopril.  Continue current meds at current dosing.  Critical lower limb ischemia History of critical limb ischemia with a left great toe ulcer which has subsequently healed.  She now has a right great toe ulcer with Dopplers that show an occluded right SFA.  She is on hemodialysis.  She will need angiography and potential intervention mention for limb preservation.      Lorretta Harp MD FACP,FACC,FAHA, [facility] 11/01/2017 10:24 AM

## 2017-11-02 ENCOUNTER — Ambulatory Visit (INDEPENDENT_AMBULATORY_CARE_PROVIDER_SITE_OTHER): Payer: Medicare HMO | Admitting: Podiatry

## 2017-11-02 DIAGNOSIS — L97512 Non-pressure chronic ulcer of other part of right foot with fat layer exposed: Secondary | ICD-10-CM

## 2017-11-02 DIAGNOSIS — I70235 Atherosclerosis of native arteries of right leg with ulceration of other part of foot: Secondary | ICD-10-CM

## 2017-11-02 DIAGNOSIS — E1151 Type 2 diabetes mellitus with diabetic peripheral angiopathy without gangrene: Secondary | ICD-10-CM

## 2017-11-03 DIAGNOSIS — N186 End stage renal disease: Secondary | ICD-10-CM | POA: Diagnosis not present

## 2017-11-03 DIAGNOSIS — N2581 Secondary hyperparathyroidism of renal origin: Secondary | ICD-10-CM | POA: Diagnosis not present

## 2017-11-05 DIAGNOSIS — N2581 Secondary hyperparathyroidism of renal origin: Secondary | ICD-10-CM | POA: Diagnosis not present

## 2017-11-05 DIAGNOSIS — N186 End stage renal disease: Secondary | ICD-10-CM | POA: Diagnosis not present

## 2017-11-06 NOTE — Progress Notes (Signed)
   Subjective:  72 year old female with PMHx of diabetes mellitus presenting today for follow up evaluation of an ulceration of the right great toe. She states the wound looks worse than it did at her previous visit. There are no modifying factors noted. She is a dialysis patient with treatments on Tuesday, Thursday and Saturday. Patient is here for further evaluation and treatment.   Past Medical History:  Diagnosis Date  . Arthritis   . Diabetes mellitus    diet controlled-  . Dizziness    occasionally  . ESRD (end stage renal disease) ([place])    TTHS, [place]  . History of colon polyps   . History of gout    takes Allopurinol daily  . Hyperlipidemia    takes Pravastatin daily  . Hypertension    takes Monopril daily  . Joint pain   . Patient is Jehovah's Witness    NO BLOOD OR BLOOD PRODUCTS. Albumin okay.   . Peripheral neuropathy   . Peripheral vascular disease ([place])   . Pneumonia    hx of-yrs ago  . Stroke [facility])       Objective/Physical Exam General: The patient is alert and oriented x3 in no acute distress.  Dermatology:  Wound #1 noted to the right great toe measuring 2.5 x 2.0 x 0.4 cm cm (LxWxD).   To the noted ulceration(s), there is no eschar. There is a moderate amount of slough, fibrin, and necrotic tissue noted. Granulation tissue and wound base is red. There is a minimal amount of serosanguineous drainage noted. There is no exposed bone muscle-tendon ligament or joint. There is no malodor. Periwound integrity is intact. Skin is warm, dry and supple bilateral lower extremities.  Vascular: Palpable pedal pulses bilaterally. No edema or erythema noted. Capillary refill within normal limits.  Neurological: Epicritic and protective threshold diminished bilaterally.   Musculoskeletal Exam: Range of motion within normal limits to all pedal and ankle joints bilateral. Muscle strength 5/5 in all groups bilateral.   Assessment: #1 ulceration of the right great toe  secondary to diabetes mellitus #2 diabetes mellitus w/ peripheral neuropathy #3 PVD RLE   Plan of Care:  #1 Patient was evaluated. #2 Medically necessary excisional debridement including muscle and deep fascial tissue was performed using a tissue nipper and a chisel blade. Excisional debridement of all the necrotic nonviable tissue down to healthy bleeding viable tissue was performed with post-debridement measurements same as pre-. #3 The wound was cleansed and dry sterile dressing applied. #4 Recommended Betadine and dry sterile dressing daily.  #5 Stressed the importance of follow up with Dr. Gwenlyn Found to have angioplasty as soon as possible to improve circulation.  #6 Patient states she is having surgery on May 13th to have a kidney removed.  #7 Return to clinic in 4 weeks.     Edrick Kins, DPM [facility]  Dr. Edrick Kins, [place]                                        [place], [place] [address]                Office [phone]  Fax [phone]

## 2017-11-07 DIAGNOSIS — E1122 Type 2 diabetes mellitus with diabetic chronic kidney disease: Secondary | ICD-10-CM | POA: Diagnosis not present

## 2017-11-07 DIAGNOSIS — N186 End stage renal disease: Secondary | ICD-10-CM | POA: Diagnosis not present

## 2017-11-07 DIAGNOSIS — N2581 Secondary hyperparathyroidism of renal origin: Secondary | ICD-10-CM | POA: Diagnosis not present

## 2017-11-07 DIAGNOSIS — M109 Gout, unspecified: Secondary | ICD-10-CM | POA: Diagnosis not present

## 2017-11-07 DIAGNOSIS — N2889 Other specified disorders of kidney and ureter: Secondary | ICD-10-CM | POA: Diagnosis not present

## 2017-11-07 DIAGNOSIS — E785 Hyperlipidemia, unspecified: Secondary | ICD-10-CM | POA: Diagnosis not present

## 2017-11-07 DIAGNOSIS — D631 Anemia in chronic kidney disease: Secondary | ICD-10-CM | POA: Diagnosis not present

## 2017-11-07 DIAGNOSIS — Z992 Dependence on renal dialysis: Secondary | ICD-10-CM | POA: Diagnosis not present

## 2017-11-07 DIAGNOSIS — I12 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 stage renal disease: Secondary | ICD-10-CM | POA: Diagnosis not present

## 2017-11-08 DIAGNOSIS — N2581 Secondary hyperparathyroidism of renal origin: Secondary | ICD-10-CM | POA: Diagnosis not present

## 2017-11-08 DIAGNOSIS — N186 End stage renal disease: Secondary | ICD-10-CM | POA: Diagnosis not present

## 2017-11-10 DIAGNOSIS — N2581 Secondary hyperparathyroidism of renal origin: Secondary | ICD-10-CM | POA: Diagnosis not present

## 2017-11-10 DIAGNOSIS — N186 End stage renal disease: Secondary | ICD-10-CM | POA: Diagnosis not present

## 2017-11-12 DIAGNOSIS — N186 End stage renal disease: Secondary | ICD-10-CM | POA: Diagnosis not present

## 2017-11-12 DIAGNOSIS — N2581 Secondary hyperparathyroidism of renal origin: Secondary | ICD-10-CM | POA: Diagnosis not present

## 2017-11-14 DIAGNOSIS — N2581 Secondary hyperparathyroidism of renal origin: Secondary | ICD-10-CM | POA: Diagnosis not present

## 2017-11-14 DIAGNOSIS — E1121 Type 2 diabetes mellitus with diabetic nephropathy: Secondary | ICD-10-CM | POA: Diagnosis not present

## 2017-11-14 DIAGNOSIS — Z905 Acquired absence of kidney: Secondary | ICD-10-CM | POA: Diagnosis not present

## 2017-11-14 DIAGNOSIS — M109 Gout, unspecified: Secondary | ICD-10-CM | POA: Diagnosis not present

## 2017-11-14 DIAGNOSIS — Z794 Long term (current) use of insulin: Secondary | ICD-10-CM | POA: Diagnosis not present

## 2017-11-14 DIAGNOSIS — E1151 Type 2 diabetes mellitus with diabetic peripheral angiopathy without gangrene: Secondary | ICD-10-CM | POA: Diagnosis not present

## 2017-11-14 DIAGNOSIS — N041 Nephrotic syndrome with focal and segmental glomerular lesions: Secondary | ICD-10-CM | POA: Diagnosis not present

## 2017-11-14 DIAGNOSIS — I12 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 stage renal disease: Secondary | ICD-10-CM | POA: Diagnosis not present

## 2017-11-14 DIAGNOSIS — Z992 Dependence on renal dialysis: Secondary | ICD-10-CM | POA: Diagnosis not present

## 2017-11-14 DIAGNOSIS — N186 End stage renal disease: Secondary | ICD-10-CM | POA: Diagnosis not present

## 2017-11-14 DIAGNOSIS — N2889 Other specified disorders of kidney and ureter: Secondary | ICD-10-CM | POA: Diagnosis not present

## 2017-11-14 DIAGNOSIS — E1122 Type 2 diabetes mellitus with diabetic chronic kidney disease: Secondary | ICD-10-CM | POA: Diagnosis not present

## 2017-11-14 DIAGNOSIS — D631 Anemia in chronic kidney disease: Secondary | ICD-10-CM | POA: Diagnosis not present

## 2017-11-14 DIAGNOSIS — E785 Hyperlipidemia, unspecified: Secondary | ICD-10-CM | POA: Diagnosis not present

## 2017-11-14 DIAGNOSIS — D49511 Neoplasm of unspecified behavior of right kidney: Secondary | ICD-10-CM | POA: Diagnosis not present

## 2017-11-16 MED ORDER — ONDANSETRON 4 MG PO TBDP
4.00 | ORAL_TABLET | ORAL | Status: DC
Start: ? — End: 2017-11-16

## 2017-11-16 MED ORDER — TRAMADOL HCL 50 MG PO TABS
50.00 | ORAL_TABLET | ORAL | Status: DC
Start: ? — End: 2017-11-16

## 2017-11-16 MED ORDER — CALCITRIOL 0.25 MCG PO CAPS
0.50 | ORAL_CAPSULE | ORAL | Status: DC
Start: 2017-11-17 — End: 2017-11-16

## 2017-11-16 MED ORDER — FUROSEMIDE 40 MG PO TABS
80.00 | ORAL_TABLET | ORAL | Status: DC
Start: 2017-11-16 — End: 2017-11-16

## 2017-11-16 MED ORDER — GABAPENTIN 100 MG PO CAPS
100.00 | ORAL_CAPSULE | ORAL | Status: DC
Start: ? — End: 2017-11-16

## 2017-11-16 MED ORDER — SENNOSIDES 8.6 MG PO TABS
8.60 | ORAL_TABLET | ORAL | Status: DC
Start: ? — End: 2017-11-16

## 2017-11-16 MED ORDER — DOCUSATE SODIUM 100 MG PO CAPS
100.00 | ORAL_CAPSULE | ORAL | Status: DC
Start: 2017-11-16 — End: 2017-11-16

## 2017-11-16 MED ORDER — ASPIRIN EC 81 MG PO TBEC
81.00 | DELAYED_RELEASE_TABLET | ORAL | Status: DC
Start: 2017-11-18 — End: 2017-11-16

## 2017-11-16 MED ORDER — ALLOPURINOL 100 MG PO TABS
100.00 | ORAL_TABLET | ORAL | Status: DC
Start: 2017-11-17 — End: 2017-11-16

## 2017-11-16 MED ORDER — PRAVASTATIN SODIUM 10 MG PO TABS
20.00 | ORAL_TABLET | ORAL | Status: DC
Start: 2017-11-17 — End: 2017-11-16

## 2017-11-16 MED ORDER — INSULIN LISPRO 100 UNIT/ML ~~LOC~~ SOLN
2.00 | SUBCUTANEOUS | Status: DC
Start: 2017-11-16 — End: 2017-11-16

## 2017-11-16 MED ORDER — CALCIUM ACETATE (PHOS BINDER) 667 MG PO CAPS
1334.00 | ORAL_CAPSULE | ORAL | Status: DC
Start: 2017-11-16 — End: 2017-11-16

## 2017-11-16 MED ORDER — HEPARIN SODIUM (PORCINE) 5000 UNIT/ML IJ SOLN
5000.00 | INTRAMUSCULAR | Status: DC
Start: 2017-11-16 — End: 2017-11-16

## 2017-11-16 MED ORDER — DEXTROSE 10 % IV SOLN
125.00 | INTRAVENOUS | Status: DC
Start: ? — End: 2017-11-16

## 2017-11-17 DIAGNOSIS — N2581 Secondary hyperparathyroidism of renal origin: Secondary | ICD-10-CM | POA: Diagnosis not present

## 2017-11-17 DIAGNOSIS — E0842 Diabetes mellitus due to underlying condition with diabetic polyneuropathy: Secondary | ICD-10-CM | POA: Diagnosis not present

## 2017-11-17 DIAGNOSIS — D689 Coagulation defect, unspecified: Secondary | ICD-10-CM | POA: Diagnosis not present

## 2017-11-17 DIAGNOSIS — D631 Anemia in chronic kidney disease: Secondary | ICD-10-CM | POA: Diagnosis not present

## 2017-11-17 DIAGNOSIS — N186 End stage renal disease: Secondary | ICD-10-CM | POA: Diagnosis not present

## 2017-11-19 DIAGNOSIS — D631 Anemia in chronic kidney disease: Secondary | ICD-10-CM | POA: Diagnosis not present

## 2017-11-19 DIAGNOSIS — N186 End stage renal disease: Secondary | ICD-10-CM | POA: Diagnosis not present

## 2017-11-19 DIAGNOSIS — E0842 Diabetes mellitus due to underlying condition with diabetic polyneuropathy: Secondary | ICD-10-CM | POA: Diagnosis not present

## 2017-11-19 DIAGNOSIS — D689 Coagulation defect, unspecified: Secondary | ICD-10-CM | POA: Diagnosis not present

## 2017-11-19 DIAGNOSIS — N2581 Secondary hyperparathyroidism of renal origin: Secondary | ICD-10-CM | POA: Diagnosis not present

## 2017-11-21 ENCOUNTER — Ambulatory Visit (INDEPENDENT_AMBULATORY_CARE_PROVIDER_SITE_OTHER): Payer: Medicare HMO | Admitting: Podiatry

## 2017-11-21 ENCOUNTER — Encounter: Payer: Self-pay | Admitting: Podiatry

## 2017-11-21 DIAGNOSIS — I739 Peripheral vascular disease, unspecified: Secondary | ICD-10-CM | POA: Diagnosis not present

## 2017-11-21 DIAGNOSIS — R0989 Other specified symptoms and signs involving the circulatory and respiratory systems: Secondary | ICD-10-CM

## 2017-11-21 DIAGNOSIS — E0843 Diabetes mellitus due to underlying condition with diabetic autonomic (poly)neuropathy: Secondary | ICD-10-CM

## 2017-11-21 DIAGNOSIS — I70235 Atherosclerosis of native arteries of right leg with ulceration of other part of foot: Secondary | ICD-10-CM

## 2017-11-21 DIAGNOSIS — L97509 Non-pressure chronic ulcer of other part of unspecified foot with unspecified severity: Secondary | ICD-10-CM | POA: Diagnosis not present

## 2017-11-21 DIAGNOSIS — L97512 Non-pressure chronic ulcer of other part of right foot with fat layer exposed: Secondary | ICD-10-CM | POA: Diagnosis not present

## 2017-11-21 DIAGNOSIS — E1140 Type 2 diabetes mellitus with diabetic neuropathy, unspecified: Secondary | ICD-10-CM | POA: Diagnosis not present

## 2017-11-21 DIAGNOSIS — R69 Illness, unspecified: Secondary | ICD-10-CM | POA: Diagnosis not present

## 2017-11-21 DIAGNOSIS — E1159 Type 2 diabetes mellitus with other circulatory complications: Secondary | ICD-10-CM | POA: Diagnosis not present

## 2017-11-22 DIAGNOSIS — N186 End stage renal disease: Secondary | ICD-10-CM | POA: Diagnosis not present

## 2017-11-22 DIAGNOSIS — D689 Coagulation defect, unspecified: Secondary | ICD-10-CM | POA: Diagnosis not present

## 2017-11-22 DIAGNOSIS — N2581 Secondary hyperparathyroidism of renal origin: Secondary | ICD-10-CM | POA: Diagnosis not present

## 2017-11-22 DIAGNOSIS — E0842 Diabetes mellitus due to underlying condition with diabetic polyneuropathy: Secondary | ICD-10-CM | POA: Diagnosis not present

## 2017-11-22 DIAGNOSIS — D631 Anemia in chronic kidney disease: Secondary | ICD-10-CM | POA: Diagnosis not present

## 2017-11-23 ENCOUNTER — Ambulatory Visit (INDEPENDENT_AMBULATORY_CARE_PROVIDER_SITE_OTHER): Payer: Medicare HMO | Admitting: Vascular Surgery

## 2017-11-23 ENCOUNTER — Ambulatory Visit (HOSPITAL_COMMUNITY)
Admission: RE | Admit: 2017-11-23 | Discharge: 2017-11-23 | Disposition: A | Discharge status: Home / Self Care | Payer: Medicare HMO | Source: Ambulatory Visit | Attending: Vascular Surgery | Admitting: Vascular Surgery

## 2017-11-23 ENCOUNTER — Other Ambulatory Visit: Payer: Self-pay

## 2017-11-23 ENCOUNTER — Encounter: Payer: Self-pay | Admitting: Vascular Surgery

## 2017-11-23 DIAGNOSIS — R0989 Other specified symptoms and signs involving the circulatory and respiratory systems: Secondary | ICD-10-CM | POA: Diagnosis not present

## 2017-11-23 DIAGNOSIS — I70269 Atherosclerosis of native arteries of extremities with gangrene, unspecified extremity: Secondary | ICD-10-CM | POA: Insufficient documentation

## 2017-11-23 DIAGNOSIS — R9389 Abnormal findings on diagnostic imaging of other specified body structures: Secondary | ICD-10-CM | POA: Insufficient documentation

## 2017-11-23 DIAGNOSIS — I70261 Atherosclerosis of native arteries of extremities with gangrene, right leg: Secondary | ICD-10-CM

## 2017-11-23 NOTE — Progress Notes (Signed)
   Subjective:  72 year old female with PMHx of diabetes mellitus presenting today for follow up evaluation of an ulceration of the right great toe. She states the wound does not look well to her. She has been using betadine daily with a dry sterile dressing as directed. She had a kidney removed about one week ago and is still recovering. Patient is here for further evaluation and treatment.   Past Medical History:  Diagnosis Date  . Arthritis   . Diabetes mellitus    diet controlled-  . Dizziness    occasionally  . ESRD (end stage renal disease) ([place])    TTHS, [place]  . History of colon polyps   . History of gout    takes Allopurinol daily  . Hyperlipidemia    takes Pravastatin daily  . Hypertension    takes Monopril daily  . Joint pain   . Patient is Jehovah's Witness    NO BLOOD OR BLOOD PRODUCTS. Albumin okay.   . Peripheral neuropathy   . Peripheral vascular disease ([place])   . Pneumonia    hx of-yrs ago  . Stroke [facility])       Objective/Physical Exam General: The patient is alert and oriented x3 in no acute distress.  Dermatology:  Wound #1 noted to the right great toe measuring 2.0 x 2.0 x 0.1 cm (LxWxD).   To the noted ulceration(s), there is no eschar. There is a moderate amount of slough, fibrin, and necrotic tissue noted. Granulation tissue and wound base is red. There is a minimal amount of serosanguineous drainage noted. There is no exposed bone muscle-tendon ligament or joint. There is no malodor. Periwound integrity is intact. Skin is warm, dry and supple bilateral lower extremities.  Vascular: Palpable pedal pulses bilaterally. No edema or erythema noted. Capillary refill within normal limits.  Neurological: Epicritic and protective threshold diminished bilaterally.   Musculoskeletal Exam: Range of motion within normal limits to all pedal and ankle joints bilateral. Muscle strength 5/5 in all groups bilateral.   Assessment: #1 ulceration of the right  great toe secondary to diabetes mellitus #2 diabetes mellitus w/ peripheral neuropathy #3 PVD RLE   Plan of Care:  #1 Patient was evaluated. #2 Medically necessary excisional debridement including muscle and deep fascial tissue was performed using a tissue nipper and a chisel blade. Excisional debridement of all the necrotic nonviable tissue down to healthy bleeding viable tissue was performed with post-debridement measurements same as pre-. #3 The wound was cleansed and dry sterile dressing applied. #4 Continue using betadine and dry sterile dressing daily.  #5 Stressed the importance of follow up with Dr. Gwenlyn Found to have angioplasty as soon as possible to improve circulation.  #6 DM shoes dispensed today.  #7 Patient had kidney removed May 13th. Still recovering.  #8 Return to clinic in 4 weeks.      Edrick Kins, DPM [facility]  Dr. Edrick Kins, [address]                                        [place], [place] [address]                Office [phone]  Fax [phone]

## 2017-11-23 NOTE — Progress Notes (Signed)
Requested by:  Vernie Shanks, [address], [place] [address]  Reason for consultation: right great toe gangrene   History of Present Illness   Joanne Carlson is a 72 y.o. (24-Nov-1945) female w/ ESRD-HD who presents with chief complaint: right great toe ulcer.  Pt is unclear of MOI but two months ago she developed skin breakdown in R great toe.  She associates this with start of HD.  Reported she had a prior ulcer in this great toe that healed.  Pt is reported scheduled for an angiogram with Dr. Gwenlyn Found.  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  She denies any drainage or fever or chills.  She denies any intermittent claudication or rest pain.  She has been seen previous for R single staged BVT.  Atherosclerotic risk factors include: DM, HTN, HLD and prior smoker.  Past Medical History:  Diagnosis Date  . Arthritis   . Diabetes mellitus    diet controlled-  . Dizziness    occasionally  . ESRD (end stage renal disease) ([place])    TTHS, [place]  . History of colon polyps   . History of gout    takes Allopurinol daily  . Hyperlipidemia    takes Pravastatin daily  . Hypertension    takes Monopril daily  . Joint pain   . Patient is Jehovah's Witness    NO BLOOD OR BLOOD PRODUCTS. Albumin okay.   . Peripheral neuropathy   . Peripheral vascular disease ([place])   . Pneumonia    hx of-yrs ago  . Stroke [facility])     Past Surgical History:  Procedure Laterality Date  . APPENDECTOMY    . BASCILIC VEIN TRANSPOSITION Right 10/03/2013   Procedure: BRACHIOCEPHALIC Arteriovenous Fistula ;  Surgeon: Mal Misty, MD;  Location: [place];  Service: Vascular;  Laterality: Right;  . BASCILIC VEIN TRANSPOSITION Right 11/17/2016   Procedure: BASCILIC VEIN TRANSPOSITION- RIGHT SINGLE STAGE;  Surgeon: Conrad [place], MD;  Location: [place];  Service: Vascular;  Laterality: Right;  . COLONOSCOPY    .  cyst removed from lower abdomen  80's  . ESOPHAGOGASTRODUODENOSCOPY    . rotator cuff surgery Right 2013  . TUBAL LIGATION    . UPPER EXTREMITY VENOGRAPHY Bilateral 11/08/2016   Procedure: Bilateral Upper Extremity Venography;  Surgeon: Conrad Barryton, MD;  Location: [facility];  Service: Cardiovascular;  Laterality: Bilateral;    Social History   Socioeconomic History  . Marital status: Widowed    Spouse name: Not on file  . Number of children: Not on file  . Years of education: Not on file  . Highest education level: Not on file  Occupational History  . Not on file  Social Needs  . Financial resource strain: Not on file  . Food insecurity:    Worry: Not on file    Inability: Not on file  . Transportation needs:    Medical: Not on file    Non-medical: Not on file  Tobacco Use  . Smoking status: Former Smoker    Years: 10.00    Types: Cigarettes  . Smokeless tobacco: Never Used  . Tobacco comment: quit smoking over 35+yrs ago  Substance and Sexual Activity  . Alcohol use: No  . Drug use: No  . Sexual activity: Never    Birth control/protection: Post-menopausal  Lifestyle  . Physical activity:  Days per week: Not on file    Minutes per session: Not on file  . Stress: Not on file  Relationships  . Social connections:    Talks on phone: Not on file    Gets together: Not on file    Attends religious service: Not on file    Active member of club or organization: Not on file    Attends meetings of clubs or organizations: Not on file    Relationship status: Not on file  . Intimate partner violence:    Fear of current or ex partner: Not on file    Emotionally abused: Not on file    Physically abused: Not on file    Forced sexual activity: Not on file  Other Topics Concern  . Not on file  Social History Narrative  . Not on file    Family History  Problem Relation Age of Onset  . Depression Mother   . Hypertension Mother   . Depression Father   .  Depression Sister   . Hypertension Sister     Current Outpatient Medications  Medication Sig Dispense Refill  . allopurinol (ZYLOPRIM) 100 MG tablet Take 200 mg by mouth daily.     Marland Kitchen aspirin EC 81 MG tablet Take 81 mg by mouth daily.    . Blood Glucose Monitoring Suppl (ACCU-CHEK AVIVA PLUS) w/Device KIT   0  . calcium acetate (PHOSLO) 667 MG capsule Take 667-1,334 mg by mouth See admin instructions. Take 2 caps three times a day with meals. Take 1 cap with snacks twice daily.    . cinacalcet (SENSIPAR) 30 MG tablet Take 30 mg by mouth daily.    . fosinopril (MONOPRIL) 40 MG tablet Take 40 mg by mouth 2 (two) times daily.    . furosemide (LASIX) 80 MG tablet Take by mouth.    . gabapentin (NEURONTIN) 100 MG capsule Take 1 capsule by mouth every morning and 1 capsule at bedtime. PATIENT NEEDS OFFICE VISIT FOR ADDITIONAL REFILLS (Patient taking differently: Take 100 mg by mouth daily as needed (pain). ) 60 capsule 0  . glipiZIDE (GLUCOTROL XL) 10 MG 24 hr tablet Take by mouth.    Marland Kitchen glucose blood test strip Use to test blood sugar daily. Dx code: 250.02. 100 each 3  . Lancets MISC Use to test blood sugar daily. Dx code 250.02. 100 each 3  . Polyethylene Glycol 3350 (PEG 3350) POWD Take 17 g by mouth.    . pravastatin (PRAVACHOL) 20 MG tablet Take 20 mg by mouth at bedtime.      No current facility-administered medications for this visit.     Allergies  Allergen Reactions  . Tylenol [Acetaminophen] Rash    REVIEW OF SYSTEMS (negative unless checked):   Cardiac:  _0  Chest pain or chest pressure? _1  Shortness of breath upon activity? _2  Shortness of breath when lying flat? _3  Irregular heart rhythm?  Vascular:  _4  Pain in calf, thigh, or hip brought on by walking? _5  Pain in feet at night that wakes you up from your sleep? _6  Blood clot in your veins? _7  Leg swelling?  Pulmonary:  _8  Oxygen at home? _9  Productive cough? _10  Wheezing?  Neurologic:  _11  Sudden weakness in arms  or legs? _12  Sudden numbness in arms or legs? _13  Sudden onset of difficult speaking or slurred speech? _14  Temporary loss of vision in one eye? _15  Problems with dizziness?  Gastrointestinal:  _16  Blood in stool? _17  Vomited blood?  Genitourinary:  _18  Burning  when urinating? _0  Blood in urine?  Psychiatric:  _1  Major depression  Hematologic:  _2  Bleeding problems? _3  Problems with blood clotting?  Dermatologic:  _4  Rashes or ulcers?  Constitutional:  _5  Fever or chills?  Ear/Nose/Throat:  _6  Change in hearing? _7  Nose bleeds? _8  Sore throat?  Musculoskeletal:  _9  Back pain? _10  Joint pain? _11  Muscle pain?   For VQI Use Only   PRE-ADM LIVING Home  AMB STATUS Ambulatory  CAD Sx None  PRIOR CHF None  STRESS TEST No    Physical Examination     Vitals:   11/23/17 0834  BP: 131/61  Pulse: 74  Resp: 18  Temp: 98.2 F (36.8 C)  TempSrc: Oral  SpO2: 99%  Weight: 166 lb 14.4 oz (75.7 kg)  Height: _12  (1.626 m)   Body mass index is 28.65 kg/m.  General Alert, O x 3, WD, NAD  Head [place]/AT,    Ear/Nose/ Throat Hearing grossly intact, nares without erythema or drainage, oropharynx without Erythema or Exudate, Mallampati score: 3,   Eyes PERRLA, EOMI,    Neck Supple, mid-line trachea,    Pulmonary Sym exp, good B air movt, CTA B  Cardiac RRR, Nl S1, S2, no Murmurs, No rubs, No S3,S4  Vascular Vessel Right Left  Radial Palpable Palpable  Brachial Palpable Palpable  Carotid Palpable, No Bruit Palpable, No Bruit  Aorta Not palpable N/A  Femoral Palpable Palpable  Popliteal Not palpable Not palpable  PT Not palpable Not palpable  DP Not palpable Faintly palpable    Gastro- intestinal soft, non-distended, non-tender to palpation, No guarding or rebound, no HSM, no masses, no CVAT B, No palpable prominent aortic pulse,    Musculo- skeletal M/S 5/5 throughout  , Extremities without ischemic changes except R great toe with obvious distal phalange tissue loss,  denuded skin at tip with both ischemic appearing tissue and granulation tissue, No edema present, No visible varicosities , No Lipodermatosclerosis present  Neurologic Cranial nerves 2-12 intact , Pain and light touch intact in extremities , Motor exam as listed above  Psychiatric Judgement intact, Mood & affect appropriate for pt's clinical situation  Dermatologic See M/S exam for extremity exam, No rashes otherwise noted  Lymphatic  Palpable lymph nodes: None    Non-Invasive Vascular Imaging   ABI (11/23/2017)  R:   ABI: 0.84,   PT: none  DP: mono  TBI:  ND  L:   ABI: 1.02,   PT: mono  DP: tri  TBI: 0.37  I reviewed the BLE ABI.  I suspect some artifical elevation in ABI given the abnormal waveforms in the arteries.  The L DP does look triphasic so the 1.02 is likely accurate.    Outside Studies/Documentation   4 pages of outside documents were reviewed including: Dr. Kennon Holter outpatient chart.   Medical Decision Making   Joanne Carlson is a 73 y.o. female who presents with: RLE critical limb ischemia, DM with complications, ESRD-HD   I discussed with the patient the natural history of critical limb ischemia: 25% require amputation in one year, 50% are able to maintain their limbs in one year, and 25-30% die in one year due to comorbidities. . Given the limb threatening status of this patient, I recommend an aggressive work up including proceeding with an: Aortogram, Bilateral runoff and possible intervention RLE. Marland Kitchen Pt is already scheduled with Dr. Gwenlyn Found, will await those results.  Will have pt come back in 4 weeks. . In regards to wound, I  would clean the toe daily with soap and water and dress the toe with a sterile bandage. . I would let the toe declare itself prior to considering what level the R great toe will need amputated.  I discussed in depth with the patient the nature of atherosclerosis, and emphasized the importance of maximal medical management  including strict control of blood pressure, blood glucose, and lipid levels, antiplatelet agents, obtaining regular exercise, and cessation of smoking.  The patient is aware that without maximal medical management the underlying atherosclerotic disease process will progress, limiting the benefit of any interventions.  The patient is currently on a statin: Pravachol.   The patient is currently on an anti-platelet: ASA.  Thank you for allowing [place] to participate in this patient's care.   Adele Barthel, MD, FACS [facility] Office: [phone] Pager: [phone]  11/23/2017, 8:22 AM

## 2017-11-24 DIAGNOSIS — N2581 Secondary hyperparathyroidism of renal origin: Secondary | ICD-10-CM | POA: Diagnosis not present

## 2017-11-24 DIAGNOSIS — E0842 Diabetes mellitus due to underlying condition with diabetic polyneuropathy: Secondary | ICD-10-CM | POA: Diagnosis not present

## 2017-11-24 DIAGNOSIS — N186 End stage renal disease: Secondary | ICD-10-CM | POA: Diagnosis not present

## 2017-11-24 DIAGNOSIS — D631 Anemia in chronic kidney disease: Secondary | ICD-10-CM | POA: Diagnosis not present

## 2017-11-24 DIAGNOSIS — D689 Coagulation defect, unspecified: Secondary | ICD-10-CM | POA: Diagnosis not present

## 2017-11-26 DIAGNOSIS — E0842 Diabetes mellitus due to underlying condition with diabetic polyneuropathy: Secondary | ICD-10-CM | POA: Diagnosis not present

## 2017-11-26 DIAGNOSIS — D689 Coagulation defect, unspecified: Secondary | ICD-10-CM | POA: Diagnosis not present

## 2017-11-26 DIAGNOSIS — N186 End stage renal disease: Secondary | ICD-10-CM | POA: Diagnosis not present

## 2017-11-26 DIAGNOSIS — D631 Anemia in chronic kidney disease: Secondary | ICD-10-CM | POA: Diagnosis not present

## 2017-11-26 DIAGNOSIS — N2581 Secondary hyperparathyroidism of renal origin: Secondary | ICD-10-CM | POA: Diagnosis not present

## 2017-11-29 DIAGNOSIS — D631 Anemia in chronic kidney disease: Secondary | ICD-10-CM | POA: Diagnosis not present

## 2017-11-29 DIAGNOSIS — N186 End stage renal disease: Secondary | ICD-10-CM | POA: Diagnosis not present

## 2017-11-29 DIAGNOSIS — E0842 Diabetes mellitus due to underlying condition with diabetic polyneuropathy: Secondary | ICD-10-CM | POA: Diagnosis not present

## 2017-11-29 DIAGNOSIS — N2581 Secondary hyperparathyroidism of renal origin: Secondary | ICD-10-CM | POA: Diagnosis not present

## 2017-11-29 DIAGNOSIS — D689 Coagulation defect, unspecified: Secondary | ICD-10-CM | POA: Diagnosis not present

## 2017-12-01 DIAGNOSIS — D631 Anemia in chronic kidney disease: Secondary | ICD-10-CM | POA: Diagnosis not present

## 2017-12-01 DIAGNOSIS — N2581 Secondary hyperparathyroidism of renal origin: Secondary | ICD-10-CM | POA: Diagnosis not present

## 2017-12-01 DIAGNOSIS — D689 Coagulation defect, unspecified: Secondary | ICD-10-CM | POA: Diagnosis not present

## 2017-12-01 DIAGNOSIS — E0842 Diabetes mellitus due to underlying condition with diabetic polyneuropathy: Secondary | ICD-10-CM | POA: Diagnosis not present

## 2017-12-01 DIAGNOSIS — N186 End stage renal disease: Secondary | ICD-10-CM | POA: Diagnosis not present

## 2017-12-02 DIAGNOSIS — Z992 Dependence on renal dialysis: Secondary | ICD-10-CM | POA: Diagnosis not present

## 2017-12-02 DIAGNOSIS — E1122 Type 2 diabetes mellitus with diabetic chronic kidney disease: Secondary | ICD-10-CM | POA: Diagnosis not present

## 2017-12-02 DIAGNOSIS — Z905 Acquired absence of kidney: Secondary | ICD-10-CM | POA: Diagnosis not present

## 2017-12-02 DIAGNOSIS — N186 End stage renal disease: Secondary | ICD-10-CM | POA: Diagnosis not present

## 2017-12-03 DIAGNOSIS — D631 Anemia in chronic kidney disease: Secondary | ICD-10-CM | POA: Diagnosis not present

## 2017-12-03 DIAGNOSIS — Z23 Encounter for immunization: Secondary | ICD-10-CM | POA: Diagnosis not present

## 2017-12-03 DIAGNOSIS — D689 Coagulation defect, unspecified: Secondary | ICD-10-CM | POA: Diagnosis not present

## 2017-12-03 DIAGNOSIS — N2581 Secondary hyperparathyroidism of renal origin: Secondary | ICD-10-CM | POA: Diagnosis not present

## 2017-12-03 DIAGNOSIS — N186 End stage renal disease: Secondary | ICD-10-CM | POA: Diagnosis not present

## 2017-12-05 DIAGNOSIS — E785 Hyperlipidemia, unspecified: Secondary | ICD-10-CM | POA: Diagnosis not present

## 2017-12-05 DIAGNOSIS — N185 Chronic kidney disease, stage 5: Secondary | ICD-10-CM | POA: Diagnosis not present

## 2017-12-05 DIAGNOSIS — I770 Arteriovenous fistula, acquired: Secondary | ICD-10-CM | POA: Diagnosis not present

## 2017-12-05 DIAGNOSIS — E669 Obesity, unspecified: Secondary | ICD-10-CM | POA: Diagnosis not present

## 2017-12-05 DIAGNOSIS — Z905 Acquired absence of kidney: Secondary | ICD-10-CM | POA: Diagnosis not present

## 2017-12-05 DIAGNOSIS — I10 Essential (primary) hypertension: Secondary | ICD-10-CM | POA: Diagnosis not present

## 2017-12-05 DIAGNOSIS — Z09 Encounter for follow-up examination after completed treatment for conditions other than malignant neoplasm: Secondary | ICD-10-CM | POA: Diagnosis not present

## 2017-12-05 DIAGNOSIS — M109 Gout, unspecified: Secondary | ICD-10-CM | POA: Diagnosis not present

## 2017-12-05 DIAGNOSIS — E118 Type 2 diabetes mellitus with unspecified complications: Secondary | ICD-10-CM | POA: Diagnosis not present

## 2017-12-06 DIAGNOSIS — D689 Coagulation defect, unspecified: Secondary | ICD-10-CM | POA: Diagnosis not present

## 2017-12-06 DIAGNOSIS — Z23 Encounter for immunization: Secondary | ICD-10-CM | POA: Diagnosis not present

## 2017-12-06 DIAGNOSIS — N186 End stage renal disease: Secondary | ICD-10-CM | POA: Diagnosis not present

## 2017-12-06 DIAGNOSIS — N2581 Secondary hyperparathyroidism of renal origin: Secondary | ICD-10-CM | POA: Diagnosis not present

## 2017-12-06 DIAGNOSIS — D631 Anemia in chronic kidney disease: Secondary | ICD-10-CM | POA: Diagnosis not present

## 2017-12-08 DIAGNOSIS — D689 Coagulation defect, unspecified: Secondary | ICD-10-CM | POA: Diagnosis not present

## 2017-12-08 DIAGNOSIS — N2581 Secondary hyperparathyroidism of renal origin: Secondary | ICD-10-CM | POA: Diagnosis not present

## 2017-12-08 DIAGNOSIS — N186 End stage renal disease: Secondary | ICD-10-CM | POA: Diagnosis not present

## 2017-12-08 DIAGNOSIS — D631 Anemia in chronic kidney disease: Secondary | ICD-10-CM | POA: Diagnosis not present

## 2017-12-08 DIAGNOSIS — Z23 Encounter for immunization: Secondary | ICD-10-CM | POA: Diagnosis not present

## 2017-12-09 DIAGNOSIS — H524 Presbyopia: Secondary | ICD-10-CM | POA: Diagnosis not present

## 2017-12-09 DIAGNOSIS — H5203 Hypermetropia, bilateral: Secondary | ICD-10-CM | POA: Diagnosis not present

## 2017-12-09 DIAGNOSIS — H52209 Unspecified astigmatism, unspecified eye: Secondary | ICD-10-CM | POA: Diagnosis not present

## 2017-12-10 DIAGNOSIS — D631 Anemia in chronic kidney disease: Secondary | ICD-10-CM | POA: Diagnosis not present

## 2017-12-10 DIAGNOSIS — Z23 Encounter for immunization: Secondary | ICD-10-CM | POA: Diagnosis not present

## 2017-12-10 DIAGNOSIS — N186 End stage renal disease: Secondary | ICD-10-CM | POA: Diagnosis not present

## 2017-12-10 DIAGNOSIS — N2581 Secondary hyperparathyroidism of renal origin: Secondary | ICD-10-CM | POA: Diagnosis not present

## 2017-12-10 DIAGNOSIS — D689 Coagulation defect, unspecified: Secondary | ICD-10-CM | POA: Diagnosis not present

## 2017-12-12 DIAGNOSIS — Z48816 Encounter for surgical aftercare following surgery on the genitourinary system: Secondary | ICD-10-CM | POA: Diagnosis not present

## 2017-12-12 DIAGNOSIS — Z905 Acquired absence of kidney: Secondary | ICD-10-CM | POA: Diagnosis not present

## 2017-12-12 DIAGNOSIS — Z6832 Body mass index (BMI) 32.0-32.9, adult: Secondary | ICD-10-CM | POA: Diagnosis not present

## 2017-12-12 DIAGNOSIS — L97513 Non-pressure chronic ulcer of other part of right foot with necrosis of muscle: Secondary | ICD-10-CM | POA: Diagnosis not present

## 2017-12-13 DIAGNOSIS — Z23 Encounter for immunization: Secondary | ICD-10-CM | POA: Diagnosis not present

## 2017-12-13 DIAGNOSIS — D689 Coagulation defect, unspecified: Secondary | ICD-10-CM | POA: Diagnosis not present

## 2017-12-13 DIAGNOSIS — D631 Anemia in chronic kidney disease: Secondary | ICD-10-CM | POA: Diagnosis not present

## 2017-12-13 DIAGNOSIS — N2581 Secondary hyperparathyroidism of renal origin: Secondary | ICD-10-CM | POA: Diagnosis not present

## 2017-12-13 DIAGNOSIS — N186 End stage renal disease: Secondary | ICD-10-CM | POA: Diagnosis not present

## 2017-12-15 DIAGNOSIS — Z23 Encounter for immunization: Secondary | ICD-10-CM | POA: Diagnosis not present

## 2017-12-15 DIAGNOSIS — N2581 Secondary hyperparathyroidism of renal origin: Secondary | ICD-10-CM | POA: Diagnosis not present

## 2017-12-15 DIAGNOSIS — D689 Coagulation defect, unspecified: Secondary | ICD-10-CM | POA: Diagnosis not present

## 2017-12-15 DIAGNOSIS — N186 End stage renal disease: Secondary | ICD-10-CM | POA: Diagnosis not present

## 2017-12-15 DIAGNOSIS — D631 Anemia in chronic kidney disease: Secondary | ICD-10-CM | POA: Diagnosis not present

## 2017-12-17 DIAGNOSIS — D689 Coagulation defect, unspecified: Secondary | ICD-10-CM | POA: Diagnosis not present

## 2017-12-17 DIAGNOSIS — N2581 Secondary hyperparathyroidism of renal origin: Secondary | ICD-10-CM | POA: Diagnosis not present

## 2017-12-17 DIAGNOSIS — Z23 Encounter for immunization: Secondary | ICD-10-CM | POA: Diagnosis not present

## 2017-12-17 DIAGNOSIS — N186 End stage renal disease: Secondary | ICD-10-CM | POA: Diagnosis not present

## 2017-12-17 DIAGNOSIS — D631 Anemia in chronic kidney disease: Secondary | ICD-10-CM | POA: Diagnosis not present

## 2017-12-20 DIAGNOSIS — N186 End stage renal disease: Secondary | ICD-10-CM | POA: Diagnosis not present

## 2017-12-20 DIAGNOSIS — D631 Anemia in chronic kidney disease: Secondary | ICD-10-CM | POA: Diagnosis not present

## 2017-12-20 DIAGNOSIS — D689 Coagulation defect, unspecified: Secondary | ICD-10-CM | POA: Diagnosis not present

## 2017-12-20 DIAGNOSIS — N2581 Secondary hyperparathyroidism of renal origin: Secondary | ICD-10-CM | POA: Diagnosis not present

## 2017-12-20 DIAGNOSIS — Z23 Encounter for immunization: Secondary | ICD-10-CM | POA: Diagnosis not present

## 2017-12-21 ENCOUNTER — Ambulatory Visit: Payer: Medicare HMO | Admitting: Vascular Surgery

## 2017-12-21 NOTE — Progress Notes (Deleted)
Established Critical Limb Ischemia Patient   History of Present Illness   Joanne Carlson is a 72 y.o. (1945-08-10) female who presents with chief complaint: ***.  The patient R great toe ulcer and was reported scheduled for angiography with Dr. Gwenlyn Found.  The patient notes symptoms have *** progressed.  The patient's treatment regimen currently included: maximal medical management and ***.  The patient's PMH, PSH, SH, and FamHx were reviewed on 12/21/17 are unchanged from 11/23/17.  Current Outpatient Medications  Medication Sig Dispense Refill  . allopurinol (ZYLOPRIM) 100 MG tablet Take 200 mg by mouth daily.     Marland Kitchen aspirin EC 81 MG tablet Take 81 mg by mouth daily.    . Blood Glucose Monitoring Suppl (ACCU-CHEK AVIVA PLUS) w/Device KIT   0  . calcium acetate (PHOSLO) 667 MG capsule Take 667-1,334 mg by mouth See admin instructions. Take 2 caps three times a day with meals. Take 1 cap with snacks twice daily.    . cinacalcet (SENSIPAR) 30 MG tablet Take 30 mg by mouth daily.    . fosinopril (MONOPRIL) 40 MG tablet Take 40 mg by mouth 2 (two) times daily.    . furosemide (LASIX) 80 MG tablet Take by mouth.    . gabapentin (NEURONTIN) 100 MG capsule Take 1 capsule by mouth every morning and 1 capsule at bedtime. PATIENT NEEDS OFFICE VISIT FOR ADDITIONAL REFILLS (Patient taking differently: Take 100 mg by mouth daily as needed (pain). ) 60 capsule 0  . glipiZIDE (GLUCOTROL XL) 10 MG 24 hr tablet Take by mouth.    Marland Kitchen glucose blood test strip Use to test blood sugar daily. Dx code: 250.02. 100 each 3  . Lancets MISC Use to test blood sugar daily. Dx code 250.02. 100 each 3  . Polyethylene Glycol 3350 (PEG 3350) POWD Take 17 g by mouth.    . pravastatin (PRAVACHOL) 20 MG tablet Take 20 mg by mouth at bedtime.      No current facility-administered medications for this visit.     On ROS today: ***, ***   Physical Examination  ***There were no vitals filed for this visit. ***There is no  height or weight on file to calculate BMI.  General {LOC:19197::"Somulent","Alert"}, {Orientation:19197::"Confused","O x 3"}, {Weight:19197::"Obese","Cachectic","WD"}, {General state of health:19197::"Ill appearing","Elderly","NAD"}  Pulmonary {Chest wall:19197::"Asx chest movement","Sym exp"}, {Air movt:19197::"Decreased *** air movt","good B air movt"}, {BS:19197::"rales on ***","rhonchi on ***","wheezing on ***","CTA B"}  Cardiac {Rhythm:19197::"Irregularly, irregular rate and rhythm","RRR, Nl S1, S2"}, {Murmur:19197::"Murmur present: ***","no Murmurs"}, {Rubs:19197::"Rub present: ***","No rubs"}, {Gallop:19197::"Gallop present: ***","No S3,S4"}  Vascular Vessel Right Left  Radial {Palpable:19197::"Not palpable","Faintly palpable","Palpable"} {Palpable:19197::"Not palpable","Faintly palpable","Palpable"}  Brachial {Palpable:19197::"Not palpable","Faintly palpable","Palpable"} {Palpable:19197::"Not palpable","Faintly palpable","Palpable"}  Carotid Palpable, {Bruit:19197::"Bruit present","No Bruit"} Palpable, {Bruit:19197::"Bruit present","No Bruit"}  Aorta Not palpable N/A  Femoral {Palpable:19197::"Not palpable","Faintly palpable","Palpable"} {Palpable:19197::"Not palpable","Faintly palpable","Palpable"}  Popliteal {Palpable:19197::"Prominently palpable","Not palpable"} {Palpable:19197::"Prominently palpable","Not palpable"}  PT {Palpable:19197::"Not palpable","Faintly palpable","Palpable"} {Palpable:19197::"Not palpable","Faintly palpable","Palpable"}  DP {Palpable:19197::"Not palpable","Faintly palpable","Palpable"} {Palpable:19197::"Not palpable","Faintly palpable","Palpable"}    Gastro- intestinal soft, {Distension:19197::"distended","non-distended"}, {TTP:19197::"TTP in *** quadrant","appropriate tenderness to palpation","non-tender to palpation"}, {Guarding:19197::"Guarding and rebound in *** quadrant","No guarding or rebound"}, {HSM:19197::"HSM present","no HSM"}, {Masses:19197::"Mass  present: ***","no masses"}, {Flank:19197::"CVAT on ***","Flank bruit present on ***","no CVAT B"}, {AAA:19197::"Palpable prominent aortic pulse","No palpable prominent aortic pulse"}, {Surgical incision:19197::"Surg incisions: ***","Surgical incisions well healed"," "}  Musculo- skeletal M/S 5/5 throughout {MS:19197::"except ***"," "}, Extremities without ischemic changes {MS:19197::"except ***"," "}, {Edema:19197::"Pitting edema present: ***","Non-pitting edema present: ***","No edema present"}, {Varicosities:19197::"Varicosities present: ***","No visible varicosities "}, {LDS:19197::"Lipodermatosclerosis present: ***","No Lipodermatosclerosis present"}  Neurologic Cranial nerves 2-12 intact{CN:19197::" except ***"," "}, Pain and light touch intact in extremities{CN:19197::" except for decreased sensation in ***"," "}, Motor exam as listed above    Non-Invasive Vascular Imaging   ABI (***)  R:   ABI: *** (***),   PT: {Signals:19197::"none","mono","bi","tri"}  DP: {Signals:19197::"none","mono","bi","tri"}  TBI:  ***  L:   ABI: *** (***),   PT: {Signals:19197::"none","mono","bi","tri"}  DP: {Signals:19197::"none","mono","bi","tri"}  TBI: ***   Medical Decision Making   Joanne Carlson is a 72 y.o. female who presents with: RLE critical limb ischemia with R great toe gangrene,  DM with complications, ESRD-HD   Based on the patient's vascular studies and examination, I have offered the patient: ***.  I discussed in depth with the patient the nature of atherosclerosis, and emphasized the importance of maximal medical management including strict control of blood pressure, blood glucose, and lipid levels, antiplatelet agents, obtaining regular exercise, and cessation of smoking.    The patient is aware that without maximal medical management the underlying atherosclerotic disease process will progress, limiting the benefit of any interventions.  The patient is currently  {Statin:19197::"not on on statin as not medically indicated.","not on a statin due to reported allergy.","not on a statin. Patient will be started on Lipitor 10 mg PO daily, to be titrated and managed by their primary physician.","on a statin: Mevacor.","on a statin: Zocor.","on a statin: Lipitor.","on a statin: Pravachol.","on a statin: Crestor.","on a statin: ***."}   The patient is currently {Antiplatelet:19197::"not on an anti-platelet due to allergy.","not on an anti-platelet due to bleeding risks related to use of an anticoagulant.","not on an anti-platelet. Patient will be started on ASA 81 mg PO daily","on an anti-platelet: ASA.","on an anti-platelet: Plavix.","on an anti-platelet: ASA and Plavix.","on an anti-platelet: ***."}  Thank you for allowing [place] to participate in this patient's care.   Adele Barthel, MD, FACS [facility] Office: [phone] Pager: [phone]

## 2017-12-22 DIAGNOSIS — D631 Anemia in chronic kidney disease: Secondary | ICD-10-CM | POA: Diagnosis not present

## 2017-12-22 DIAGNOSIS — D689 Coagulation defect, unspecified: Secondary | ICD-10-CM | POA: Diagnosis not present

## 2017-12-22 DIAGNOSIS — Z23 Encounter for immunization: Secondary | ICD-10-CM | POA: Diagnosis not present

## 2017-12-22 DIAGNOSIS — N2581 Secondary hyperparathyroidism of renal origin: Secondary | ICD-10-CM | POA: Diagnosis not present

## 2017-12-22 DIAGNOSIS — N186 End stage renal disease: Secondary | ICD-10-CM | POA: Diagnosis not present

## 2017-12-23 ENCOUNTER — Encounter (HOSPITAL_BASED_OUTPATIENT_CLINIC_OR_DEPARTMENT_OTHER): Payer: Medicare HMO | Attending: Internal Medicine

## 2017-12-23 DIAGNOSIS — L97515 Non-pressure chronic ulcer of other part of right foot with muscle involvement without evidence of necrosis: Secondary | ICD-10-CM | POA: Diagnosis not present

## 2017-12-23 DIAGNOSIS — Z7984 Long term (current) use of oral hypoglycemic drugs: Secondary | ICD-10-CM | POA: Diagnosis not present

## 2017-12-23 DIAGNOSIS — E1152 Type 2 diabetes mellitus with diabetic peripheral angiopathy with gangrene: Secondary | ICD-10-CM | POA: Diagnosis not present

## 2017-12-23 DIAGNOSIS — E11621 Type 2 diabetes mellitus with foot ulcer: Secondary | ICD-10-CM | POA: Insufficient documentation

## 2017-12-23 DIAGNOSIS — Z992 Dependence on renal dialysis: Secondary | ICD-10-CM | POA: Diagnosis not present

## 2017-12-23 DIAGNOSIS — I739 Peripheral vascular disease, unspecified: Secondary | ICD-10-CM | POA: Diagnosis not present

## 2017-12-23 DIAGNOSIS — Z87891 Personal history of nicotine dependence: Secondary | ICD-10-CM | POA: Insufficient documentation

## 2017-12-23 DIAGNOSIS — L97512 Non-pressure chronic ulcer of other part of right foot with fat layer exposed: Secondary | ICD-10-CM | POA: Diagnosis not present

## 2017-12-24 DIAGNOSIS — D689 Coagulation defect, unspecified: Secondary | ICD-10-CM | POA: Diagnosis not present

## 2017-12-24 DIAGNOSIS — D631 Anemia in chronic kidney disease: Secondary | ICD-10-CM | POA: Diagnosis not present

## 2017-12-24 DIAGNOSIS — N2581 Secondary hyperparathyroidism of renal origin: Secondary | ICD-10-CM | POA: Diagnosis not present

## 2017-12-24 DIAGNOSIS — N186 End stage renal disease: Secondary | ICD-10-CM | POA: Diagnosis not present

## 2017-12-24 DIAGNOSIS — Z23 Encounter for immunization: Secondary | ICD-10-CM | POA: Diagnosis not present

## 2017-12-26 ENCOUNTER — Other Ambulatory Visit (HOSPITAL_BASED_OUTPATIENT_CLINIC_OR_DEPARTMENT_OTHER): Payer: Self-pay | Admitting: Internal Medicine

## 2017-12-26 ENCOUNTER — Ambulatory Visit: Payer: Medicare HMO | Admitting: Podiatry

## 2017-12-26 DIAGNOSIS — M869 Osteomyelitis, unspecified: Principal | ICD-10-CM

## 2017-12-26 DIAGNOSIS — E1169 Type 2 diabetes mellitus with other specified complication: Principal | ICD-10-CM

## 2017-12-26 DIAGNOSIS — E11621 Type 2 diabetes mellitus with foot ulcer: Secondary | ICD-10-CM

## 2017-12-26 DIAGNOSIS — L97509 Non-pressure chronic ulcer of other part of unspecified foot with unspecified severity: Principal | ICD-10-CM

## 2017-12-27 DIAGNOSIS — N186 End stage renal disease: Secondary | ICD-10-CM | POA: Diagnosis not present

## 2017-12-27 DIAGNOSIS — N2581 Secondary hyperparathyroidism of renal origin: Secondary | ICD-10-CM | POA: Diagnosis not present

## 2017-12-27 DIAGNOSIS — D631 Anemia in chronic kidney disease: Secondary | ICD-10-CM | POA: Diagnosis not present

## 2017-12-27 DIAGNOSIS — D689 Coagulation defect, unspecified: Secondary | ICD-10-CM | POA: Diagnosis not present

## 2017-12-27 DIAGNOSIS — Z23 Encounter for immunization: Secondary | ICD-10-CM | POA: Diagnosis not present

## 2017-12-29 DIAGNOSIS — D689 Coagulation defect, unspecified: Secondary | ICD-10-CM | POA: Diagnosis not present

## 2017-12-29 DIAGNOSIS — N2581 Secondary hyperparathyroidism of renal origin: Secondary | ICD-10-CM | POA: Diagnosis not present

## 2017-12-29 DIAGNOSIS — N186 End stage renal disease: Secondary | ICD-10-CM | POA: Diagnosis not present

## 2017-12-29 DIAGNOSIS — Z23 Encounter for immunization: Secondary | ICD-10-CM | POA: Diagnosis not present

## 2017-12-29 DIAGNOSIS — D631 Anemia in chronic kidney disease: Secondary | ICD-10-CM | POA: Diagnosis not present

## 2017-12-30 ENCOUNTER — Ambulatory Visit (HOSPITAL_COMMUNITY)
Admission: RE | Admit: 2017-12-30 | Discharge: 2017-12-30 | Disposition: A | Discharge status: Home / Self Care | Payer: Medicare HMO | Source: Ambulatory Visit | Attending: Internal Medicine | Admitting: Internal Medicine

## 2017-12-30 DIAGNOSIS — L97513 Non-pressure chronic ulcer of other part of right foot with necrosis of muscle: Secondary | ICD-10-CM | POA: Diagnosis not present

## 2017-12-30 DIAGNOSIS — R609 Edema, unspecified: Secondary | ICD-10-CM | POA: Insufficient documentation

## 2017-12-30 DIAGNOSIS — R600 Localized edema: Secondary | ICD-10-CM | POA: Diagnosis not present

## 2017-12-30 DIAGNOSIS — M869 Osteomyelitis, unspecified: Secondary | ICD-10-CM | POA: Insufficient documentation

## 2017-12-30 DIAGNOSIS — Z87891 Personal history of nicotine dependence: Secondary | ICD-10-CM | POA: Diagnosis not present

## 2017-12-30 DIAGNOSIS — E1169 Type 2 diabetes mellitus with other specified complication: Secondary | ICD-10-CM | POA: Diagnosis not present

## 2017-12-30 DIAGNOSIS — E11621 Type 2 diabetes mellitus with foot ulcer: Secondary | ICD-10-CM | POA: Diagnosis not present

## 2017-12-30 DIAGNOSIS — L97515 Non-pressure chronic ulcer of other part of right foot with muscle involvement without evidence of necrosis: Secondary | ICD-10-CM | POA: Diagnosis not present

## 2017-12-30 DIAGNOSIS — Z7984 Long term (current) use of oral hypoglycemic drugs: Secondary | ICD-10-CM | POA: Diagnosis not present

## 2017-12-30 DIAGNOSIS — L97512 Non-pressure chronic ulcer of other part of right foot with fat layer exposed: Secondary | ICD-10-CM | POA: Diagnosis not present

## 2017-12-30 DIAGNOSIS — I739 Peripheral vascular disease, unspecified: Secondary | ICD-10-CM | POA: Diagnosis not present

## 2017-12-30 DIAGNOSIS — L97509 Non-pressure chronic ulcer of other part of unspecified foot with unspecified severity: Secondary | ICD-10-CM

## 2017-12-30 DIAGNOSIS — E1152 Type 2 diabetes mellitus with diabetic peripheral angiopathy with gangrene: Secondary | ICD-10-CM | POA: Diagnosis not present

## 2017-12-30 DIAGNOSIS — Z992 Dependence on renal dialysis: Secondary | ICD-10-CM | POA: Diagnosis not present

## 2017-12-31 DIAGNOSIS — N186 End stage renal disease: Secondary | ICD-10-CM | POA: Diagnosis not present

## 2017-12-31 DIAGNOSIS — D631 Anemia in chronic kidney disease: Secondary | ICD-10-CM | POA: Diagnosis not present

## 2017-12-31 DIAGNOSIS — D689 Coagulation defect, unspecified: Secondary | ICD-10-CM | POA: Diagnosis not present

## 2017-12-31 DIAGNOSIS — Z23 Encounter for immunization: Secondary | ICD-10-CM | POA: Diagnosis not present

## 2017-12-31 DIAGNOSIS — N2581 Secondary hyperparathyroidism of renal origin: Secondary | ICD-10-CM | POA: Diagnosis not present

## 2018-01-01 DIAGNOSIS — N186 End stage renal disease: Secondary | ICD-10-CM | POA: Diagnosis not present

## 2018-01-01 DIAGNOSIS — Z992 Dependence on renal dialysis: Secondary | ICD-10-CM | POA: Diagnosis not present

## 2018-01-01 DIAGNOSIS — E1122 Type 2 diabetes mellitus with diabetic chronic kidney disease: Secondary | ICD-10-CM | POA: Diagnosis not present

## 2018-01-03 DIAGNOSIS — N186 End stage renal disease: Secondary | ICD-10-CM | POA: Diagnosis not present

## 2018-01-03 DIAGNOSIS — N2581 Secondary hyperparathyroidism of renal origin: Secondary | ICD-10-CM | POA: Diagnosis not present

## 2018-01-03 DIAGNOSIS — D689 Coagulation defect, unspecified: Secondary | ICD-10-CM | POA: Diagnosis not present

## 2018-01-05 DIAGNOSIS — N2581 Secondary hyperparathyroidism of renal origin: Secondary | ICD-10-CM | POA: Diagnosis not present

## 2018-01-05 DIAGNOSIS — D689 Coagulation defect, unspecified: Secondary | ICD-10-CM | POA: Diagnosis not present

## 2018-01-05 DIAGNOSIS — N186 End stage renal disease: Secondary | ICD-10-CM | POA: Diagnosis not present

## 2018-01-06 ENCOUNTER — Encounter (HOSPITAL_BASED_OUTPATIENT_CLINIC_OR_DEPARTMENT_OTHER): Payer: Medicare HMO | Attending: Internal Medicine

## 2018-01-06 DIAGNOSIS — M86671 Other chronic osteomyelitis, right ankle and foot: Secondary | ICD-10-CM | POA: Diagnosis not present

## 2018-01-06 DIAGNOSIS — E1151 Type 2 diabetes mellitus with diabetic peripheral angiopathy without gangrene: Secondary | ICD-10-CM | POA: Diagnosis not present

## 2018-01-06 DIAGNOSIS — E11621 Type 2 diabetes mellitus with foot ulcer: Secondary | ICD-10-CM | POA: Diagnosis not present

## 2018-01-06 DIAGNOSIS — I96 Gangrene, not elsewhere classified: Secondary | ICD-10-CM | POA: Diagnosis not present

## 2018-01-06 DIAGNOSIS — L97513 Non-pressure chronic ulcer of other part of right foot with necrosis of muscle: Secondary | ICD-10-CM | POA: Insufficient documentation

## 2018-01-06 DIAGNOSIS — S91101A Unspecified open wound of right great toe without damage to nail, initial encounter: Secondary | ICD-10-CM | POA: Diagnosis not present

## 2018-01-07 DIAGNOSIS — N2581 Secondary hyperparathyroidism of renal origin: Secondary | ICD-10-CM | POA: Diagnosis not present

## 2018-01-07 DIAGNOSIS — D689 Coagulation defect, unspecified: Secondary | ICD-10-CM | POA: Diagnosis not present

## 2018-01-07 DIAGNOSIS — N186 End stage renal disease: Secondary | ICD-10-CM | POA: Diagnosis not present

## 2018-01-10 ENCOUNTER — Other Ambulatory Visit: Payer: Self-pay | Admitting: *Deleted

## 2018-01-10 ENCOUNTER — Encounter: Payer: Self-pay | Admitting: Cardiovascular Disease

## 2018-01-10 ENCOUNTER — Ambulatory Visit (INDEPENDENT_AMBULATORY_CARE_PROVIDER_SITE_OTHER): Payer: Medicare HMO | Admitting: Cardiovascular Disease

## 2018-01-10 VITALS — BP 110/60 | HR 75 | Ht 64.0 in | Wt 162.4 lb

## 2018-01-10 DIAGNOSIS — N2581 Secondary hyperparathyroidism of renal origin: Secondary | ICD-10-CM | POA: Diagnosis not present

## 2018-01-10 DIAGNOSIS — R0989 Other specified symptoms and signs involving the circulatory and respiratory systems: Secondary | ICD-10-CM | POA: Diagnosis not present

## 2018-01-10 DIAGNOSIS — I70229 Atherosclerosis of native arteries of extremities with rest pain, unspecified extremity: Secondary | ICD-10-CM

## 2018-01-10 DIAGNOSIS — I998 Other disorder of circulatory system: Secondary | ICD-10-CM | POA: Diagnosis not present

## 2018-01-10 DIAGNOSIS — I739 Peripheral vascular disease, unspecified: Secondary | ICD-10-CM

## 2018-01-10 DIAGNOSIS — N186 End stage renal disease: Secondary | ICD-10-CM | POA: Diagnosis not present

## 2018-01-10 DIAGNOSIS — D689 Coagulation defect, unspecified: Secondary | ICD-10-CM | POA: Diagnosis not present

## 2018-01-10 NOTE — Progress Notes (Signed)
01/10/2018 Joanne Carlson   Nov 06, 1945  [id number]  Primary Physician Vernie Shanks, MD Primary Cardiologist: Lorretta Harp MD Joanne Carlson, [place]  HPI:  Joanne Carlson is a 72 y.o.  mother of 2 children referred by Dr. Amalia Hailey for peripheral vascular evaluation because of critical limb ischemia.  Last saw her in the office 11/01/2017.  She has a history of treated hypertension, diabetes and hyperlipidemia. 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 Dr. Adele Barthel placed an AV fistula. She's never had a heart attack or stroke. She denies chest pain or shortness of breath. She's had a small non-healing ulcer on her left great toe treated with antibiotics recently and according to her this is slowly healing. Lower extremity arterial Doppler studies performed in our office 01/03/17 revealed ABIs approximately 0.8 bilaterally with an occluded distal right SFA and high-grade left SFA stenosis with three-vessel runoff.  Since I saw her approximately 2 months ago her left great toe ulcer has healed however she is developed a gangrenous right great toe.  I believe she would need percutaneous revascularization to facilitate wound healing.  She underwent right nephrectomy at [facility] on 11/14/2017 and is recovering from this.  She has seen Dr. Dellia Nims at the wound care center.  Her wound really has not healed over the last 2-1/2 months.  She is now ready to proceed with angiography and percutaneous intervention for limb salvage.      Current Meds  Medication Sig  . allopurinol (ZYLOPRIM) 100 MG tablet Take 200 mg by mouth daily.   Marland Kitchen aspirin EC 81 MG tablet Take 81 mg by mouth daily.  . Blood Glucose Monitoring Suppl (ACCU-CHEK AVIVA PLUS) w/Device KIT   . calcium acetate (PHOSLO) 667 MG capsule Take 667-1,334 mg by mouth See admin instructions. Take 2 caps three times a day with meals. Take 1 cap with snacks twice daily.  .  cinacalcet (SENSIPAR) 30 MG tablet Take 30 mg by mouth daily.  . fosinopril (MONOPRIL) 40 MG tablet Take 40 mg by mouth 2 (two) times daily.  . furosemide (LASIX) 80 MG tablet Take by mouth.  . gabapentin (NEURONTIN) 100 MG capsule Take 1 capsule by mouth every morning and 1 capsule at bedtime. PATIENT NEEDS OFFICE VISIT FOR ADDITIONAL REFILLS (Patient taking differently: Take 100 mg by mouth daily as needed (pain). )  . glucose blood test strip Use to test blood sugar daily. Dx code: 63.02.  Marland Kitchen Lancets MISC Use to test blood sugar daily. Dx code 250.02.  . pravastatin (PRAVACHOL) 20 MG tablet Take 20 mg by mouth at bedtime.      Allergies  Allergen Reactions  . Tylenol [Acetaminophen] Rash    Social History   Socioeconomic History  . Marital status: Widowed    Spouse name: Not on file  . Number of children: Not on file  . Years of education: Not on file  . Highest education level: Not on file  Occupational History  . Not on file  Social Needs  . Financial resource strain: Not on file  . Food insecurity:    Worry: Not on file    Inability: Not on file  . Transportation needs:    Medical: Not on file    Non-medical: Not on file  Tobacco Use  . Smoking status: Former Smoker    Years: 10.00    Types: Cigarettes  . Smokeless tobacco: Never Used  .  Tobacco comment: quit smoking over 35+yrs ago  Substance and Sexual Activity  . Alcohol use: No  . Drug use: No  . Sexual activity: Never    Birth control/protection: Post-menopausal  Lifestyle  . Physical activity:    Days per week: Not on file    Minutes per session: Not on file  . Stress: Not on file  Relationships  . Social connections:    Talks on phone: Not on file    Gets together: Not on file    Attends religious service: Not on file    Active member of club or organization: Not on file    Attends meetings of clubs or organizations: Not on file    Relationship status: Not on file  . Intimate partner violence:     Fear of current or ex partner: Not on file    Emotionally abused: Not on file    Physically abused: Not on file    Forced sexual activity: Not on file  Other Topics Concern  . Not on file  Social History Narrative  . Not on file     Review of Systems: General: negative for chills, fever, night sweats or weight changes.  Cardiovascular: negative for chest pain, dyspnea on exertion, edema, orthopnea, palpitations, paroxysmal nocturnal dyspnea or shortness of breath Dermatological: negative for rash Respiratory: negative for cough or wheezing Urologic: negative for hematuria Abdominal: negative for nausea, vomiting, diarrhea, bright red blood per rectum, melena, or hematemesis Neurologic: negative for visual changes, syncope, or dizziness All other systems reviewed and are otherwise negative except as noted above.    Blood pressure 110/60, pulse 75, height 5' 4" (1.626 m), weight 162 lb 6.4 oz (73.7 kg).  General appearance: alert and no distress Neck: no adenopathy, no carotid bruit, no JVD, supple, symmetrical, trachea midline and thyroid not enlarged, symmetric, no tenderness/mass/nodules Lungs: clear to auscultation bilaterally Heart: regular rate and rhythm, S1, S2 normal, no murmur, click, rub or gallop Extremities: extremities normal, atraumatic, no cyanosis or edema Pulses: Absent pedal pulses Skin: Ischemic ulcer tip of right great toe Neurologic: Alert and oriented X 3, normal strength and tone. Normal symmetric reflexes. Normal coordination and gait  EKG not performed today  ASSESSMENT AND PLAN:   Critical lower limb ischemia [name] returns today for follow-up of critical limb ischemia.  She has a nonhealing ulcer on her right great toe.  Her last Dopplers performed 01/03/2017 revealing occluded distal right SFA.  She is chronic renal failure patient on hemodialysis and recently had right nephrectomy 11/14/2017 at [facility] which she is  recovering from.  We will proceed with angiography and potential endovascular therapy for limb salvage.       J.  MD FACP,FACC,FAHA, FSCAI 01/10/2018 3:31 PM 

## 2018-01-10 NOTE — H&P (View-Only) (Signed)
01/10/2018 Joanne Carlson   08-17-1945  [id number]  Primary Physician Vernie Shanks, MD Primary Cardiologist: Lorretta Harp MD [name], [place]  HPI:  Joanne Carlson is a 72 y.o.  mother of 2 children referred by Dr. Amalia Hailey for peripheral vascular evaluation because of critical limb ischemia.  Last saw her in the office 11/01/2017.  She has a history of treated hypertension, diabetes and hyperlipidemia. 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 Dr. Adele Barthel placed an AV fistula. She's never had a heart attack or stroke. She denies chest pain or shortness of breath. She's had a small non-healing ulcer on her left great toe treated with antibiotics recently and according to her this is slowly healing. Lower extremity arterial Doppler studies performed in our office 01/03/17 revealed ABIs approximately 0.8 bilaterally with an occluded distal right SFA and high-grade left SFA stenosis with three-vessel runoff.  Since I saw her approximately 2 months ago her left great toe ulcer has healed however she is developed a gangrenous right great toe.  I believe she would need percutaneous revascularization to facilitate wound healing.  She underwent right nephrectomy at [facility] on 11/14/2017 and is recovering from this.  She has seen Dr. Dellia Nims at the wound care center.  Her wound really has not healed over the last 2-1/2 months.  She is now ready to proceed with angiography and percutaneous intervention for limb salvage.      Current Meds  Medication Sig  . allopurinol (ZYLOPRIM) 100 MG tablet Take 200 mg by mouth daily.   Marland Kitchen aspirin EC 81 MG tablet Take 81 mg by mouth daily.  . Blood Glucose Monitoring Suppl (ACCU-CHEK AVIVA PLUS) w/Device KIT   . calcium acetate (PHOSLO) 667 MG capsule Take 667-1,334 mg by mouth See admin instructions. Take 2 caps three times a day with meals. Take 1 cap with snacks twice daily.  .  cinacalcet (SENSIPAR) 30 MG tablet Take 30 mg by mouth daily.  . fosinopril (MONOPRIL) 40 MG tablet Take 40 mg by mouth 2 (two) times daily.  . furosemide (LASIX) 80 MG tablet Take by mouth.  . gabapentin (NEURONTIN) 100 MG capsule Take 1 capsule by mouth every morning and 1 capsule at bedtime. PATIENT NEEDS OFFICE VISIT FOR ADDITIONAL REFILLS (Patient taking differently: Take 100 mg by mouth daily as needed (pain). )  . glucose blood test strip Use to test blood sugar daily. Dx code: 49.02.  Marland Kitchen Lancets MISC Use to test blood sugar daily. Dx code 250.02.  . pravastatin (PRAVACHOL) 20 MG tablet Take 20 mg by mouth at bedtime.      Allergies  Allergen Reactions  . Tylenol [Acetaminophen] Rash    Social History   Socioeconomic History  . Marital status: Widowed    Spouse name: Not on file  . Number of children: Not on file  . Years of education: Not on file  . Highest education level: Not on file  Occupational History  . Not on file  Social Needs  . Financial resource strain: Not on file  . Food insecurity:    Worry: Not on file    Inability: Not on file  . Transportation needs:    Medical: Not on file    Non-medical: Not on file  Tobacco Use  . Smoking status: Former Smoker    Years: 10.00    Types: Cigarettes  . Smokeless tobacco: Never Used  .  Tobacco comment: quit smoking over 35+yrs ago  Substance and Sexual Activity  . Alcohol use: No  . Drug use: No  . Sexual activity: Never    Birth control/protection: Post-menopausal  Lifestyle  . Physical activity:    Days per week: Not on file    Minutes per session: Not on file  . Stress: Not on file  Relationships  . Social connections:    Talks on phone: Not on file    Gets together: Not on file    Attends religious service: Not on file    Active member of club or organization: Not on file    Attends meetings of clubs or organizations: Not on file    Relationship status: Not on file  . Intimate partner violence:     Fear of current or ex partner: Not on file    Emotionally abused: Not on file    Physically abused: Not on file    Forced sexual activity: Not on file  Other Topics Concern  . Not on file  Social History Narrative  . Not on file     Review of Systems: General: negative for chills, fever, night sweats or weight changes.  Cardiovascular: negative for chest pain, dyspnea on exertion, edema, orthopnea, palpitations, paroxysmal nocturnal dyspnea or shortness of breath Dermatological: negative for rash Respiratory: negative for cough or wheezing Urologic: negative for hematuria Abdominal: negative for nausea, vomiting, diarrhea, bright red blood per rectum, melena, or hematemesis Neurologic: negative for visual changes, syncope, or dizziness All other systems reviewed and are otherwise negative except as noted above.    Blood pressure 110/60, pulse 75, height 5' 4" (1.626 m), weight 162 lb 6.4 oz (73.7 kg).  General appearance: alert and no distress Neck: no adenopathy, no carotid bruit, no JVD, supple, symmetrical, trachea midline and thyroid not enlarged, symmetric, no tenderness/mass/nodules Lungs: clear to auscultation bilaterally Heart: regular rate and rhythm, S1, S2 normal, no murmur, click, rub or gallop Extremities: extremities normal, atraumatic, no cyanosis or edema Pulses: Absent pedal pulses Skin: Ischemic ulcer tip of right great toe Neurologic: Alert and oriented X 3, normal strength and tone. Normal symmetric reflexes. Normal coordination and gait  EKG not performed today  ASSESSMENT AND PLAN:   Critical lower limb ischemia Ms. Radi returns today for follow-up of critical limb ischemia.  She has a nonhealing ulcer on her right great toe.  Her last Dopplers performed 01/03/2017 revealing occluded distal right SFA.  She is chronic renal failure patient on hemodialysis and recently had right nephrectomy 11/14/2017 at [facility] which she is  recovering from.  We will proceed with angiography and potential endovascular therapy for limb salvage.      [name] [name] MD FACP,FACC,FAHA, FSCAI 01/10/2018 3:31 PM 

## 2018-01-10 NOTE — Assessment & Plan Note (Signed)
Ms. Rabelo returns today for follow-up of critical limb ischemia.  She has a nonhealing ulcer on her right great toe.  Her last Dopplers performed 01/03/2017 revealing occluded distal right SFA.  She is chronic renal failure patient on hemodialysis and recently had right nephrectomy 11/14/2017 at [facility] which she is recovering from.  We will proceed with angiography and potential endovascular therapy for limb salvage.

## 2018-01-10 NOTE — Patient Instructions (Signed)
   [facility] [address] [place] [address] Dept: [phone] Loc: [phone]  Joanne Carlson  01/10/2018  You are scheduled for a Peripheral Angiogram on Monday, July 29 with Dr. [name].  1. Please arrive at the [facility] (Main Entrance A) at [facility]: [address] [place], [place] [address] at 5:30 AM (two hours before your procedure to ensure your preparation). Free valet parking service is available.   Special note: Every effort is made to have your procedure done on time. Please understand that emergencies sometimes delay scheduled procedures.  2. Diet: Do not eat or drink anything after midnight prior to your procedure except sips of water to take medications.  3. Labs: Your physician recommends that you HAVE LAB WORK TODAY  4. Medication instructions in preparation for your procedure:  DO NOT TAKE FUROSEMIDE THE MORNING OF THE PROCEDURE  On the morning of your procedure, take your Aspirin and any morning medicines NOT listed above.  You may use sips of water.  5. Plan for one night stay--bring personal belongings. 6. Bring a current list of your medications and current insurance cards. 7. You MUST have a responsible person to drive you home. 8. Someone MUST be with you the first 24 hours after you arrive home or your discharge will be delayed. 9. Please wear clothes that are easy to get on and off and wear slip-on shoes.  Thank you for allowing [place] to care for you!   -- [facility]   Your physician has requested that you have a lower extremity arterial duplex. This test is an ultrasound of the arteries in the legs or arms. It looks at arterial blood flow in the legs and arms. Allow one hour for Lower and Upper Arterial scans. There are no restrictions or special instructions SCHEDULE PRIOR TO PROCEDURE

## 2018-01-11 LAB — BASIC METABOLIC PANEL
BUN / CREAT RATIO: 4 — AB (ref 12–28)
BUN: 14 mg/dL (ref 8–27)
CALCIUM: 9.3 mg/dL (ref 8.7–10.3)
CHLORIDE: 90 mmol/L — AB (ref 96–106)
CO2: 28 mmol/L (ref 20–29)
Creatinine, Ser: 3.39 mg/dL — ABNORMAL HIGH (ref 0.57–1.00)
GFR calc Af Amer: 15 mL/min/{1.73_m2} — ABNORMAL LOW (ref >59)
GFR, EST NON AFRICAN AMERICAN: 13 mL/min/{1.73_m2} — AB (ref >59)
Glucose: 74 mg/dL (ref 65–99)
POTASSIUM: 4 mmol/L (ref 3.5–5.2)
SODIUM: 135 mmol/L (ref 134–144)

## 2018-01-11 LAB — CBC
Hematocrit: 39.8 % (ref 34.0–46.6)
Hemoglobin: 12.6 g/dL (ref 11.1–15.9)
MCH: 29.7 pg (ref 26.6–33.0)
MCHC: 31.7 g/dL (ref 31.5–35.7)
MCV: 94 fL (ref 79–97)
PLATELETS: 236 10*3/uL (ref 150–450)
RBC: 4.24 x10E6/uL (ref 3.77–5.28)
RDW: 15.2 % (ref 12.3–15.4)
WBC: 9.2 10*3/uL (ref 3.4–10.8)

## 2018-01-12 DIAGNOSIS — E1151 Type 2 diabetes mellitus with diabetic peripheral angiopathy without gangrene: Secondary | ICD-10-CM | POA: Diagnosis not present

## 2018-01-12 DIAGNOSIS — D689 Coagulation defect, unspecified: Secondary | ICD-10-CM | POA: Diagnosis not present

## 2018-01-12 DIAGNOSIS — L97511 Non-pressure chronic ulcer of other part of right foot limited to breakdown of skin: Secondary | ICD-10-CM | POA: Diagnosis not present

## 2018-01-12 DIAGNOSIS — E11621 Type 2 diabetes mellitus with foot ulcer: Secondary | ICD-10-CM | POA: Diagnosis not present

## 2018-01-12 DIAGNOSIS — N186 End stage renal disease: Secondary | ICD-10-CM | POA: Diagnosis not present

## 2018-01-12 DIAGNOSIS — M86671 Other chronic osteomyelitis, right ankle and foot: Secondary | ICD-10-CM | POA: Diagnosis not present

## 2018-01-12 DIAGNOSIS — N2581 Secondary hyperparathyroidism of renal origin: Secondary | ICD-10-CM | POA: Diagnosis not present

## 2018-01-12 DIAGNOSIS — L97513 Non-pressure chronic ulcer of other part of right foot with necrosis of muscle: Secondary | ICD-10-CM | POA: Diagnosis not present

## 2018-01-13 ENCOUNTER — Other Ambulatory Visit: Payer: Self-pay | Admitting: Cardiovascular Disease

## 2018-01-13 DIAGNOSIS — R0989 Other specified symptoms and signs involving the circulatory and respiratory systems: Secondary | ICD-10-CM

## 2018-01-14 DIAGNOSIS — N2581 Secondary hyperparathyroidism of renal origin: Secondary | ICD-10-CM | POA: Diagnosis not present

## 2018-01-14 DIAGNOSIS — D689 Coagulation defect, unspecified: Secondary | ICD-10-CM | POA: Diagnosis not present

## 2018-01-14 DIAGNOSIS — N186 End stage renal disease: Secondary | ICD-10-CM | POA: Diagnosis not present

## 2018-01-16 ENCOUNTER — Ambulatory Visit (HOSPITAL_COMMUNITY)
Admission: RE | Admit: 2018-01-16 | Discharge: 2018-01-16 | Disposition: A | Discharge status: Home / Self Care | Payer: Medicare HMO | Source: Ambulatory Visit | Attending: Cardiovascular Disease | Admitting: Cardiovascular Disease

## 2018-01-16 ENCOUNTER — Other Ambulatory Visit: Payer: Self-pay | Admitting: Cardiovascular Disease

## 2018-01-16 DIAGNOSIS — R0989 Other specified symptoms and signs involving the circulatory and respiratory systems: Secondary | ICD-10-CM

## 2018-01-16 DIAGNOSIS — I739 Peripheral vascular disease, unspecified: Secondary | ICD-10-CM

## 2018-01-17 DIAGNOSIS — N186 End stage renal disease: Secondary | ICD-10-CM | POA: Diagnosis not present

## 2018-01-17 DIAGNOSIS — D689 Coagulation defect, unspecified: Secondary | ICD-10-CM | POA: Diagnosis not present

## 2018-01-17 DIAGNOSIS — N2581 Secondary hyperparathyroidism of renal origin: Secondary | ICD-10-CM | POA: Diagnosis not present

## 2018-01-18 ENCOUNTER — Telehealth: Payer: Self-pay | Admitting: *Deleted

## 2018-01-18 NOTE — Telephone Encounter (Signed)
Patient walk- in and filled out form with complaint and then asked someone to call her. Patient c/o pain in right arm to thumb that stated after cannulation for HD on 01/13/18.Constant shooting type pain, no other problems. Access is being used for HD and successful completion of treatment on subsequent visits. Agreeable to see PA on 01/23/18 arrive @1 :45.

## 2018-01-19 DIAGNOSIS — D689 Coagulation defect, unspecified: Secondary | ICD-10-CM | POA: Diagnosis not present

## 2018-01-19 DIAGNOSIS — N186 End stage renal disease: Secondary | ICD-10-CM | POA: Diagnosis not present

## 2018-01-19 DIAGNOSIS — N2581 Secondary hyperparathyroidism of renal origin: Secondary | ICD-10-CM | POA: Diagnosis not present

## 2018-01-20 DIAGNOSIS — E1151 Type 2 diabetes mellitus with diabetic peripheral angiopathy without gangrene: Secondary | ICD-10-CM | POA: Diagnosis not present

## 2018-01-20 DIAGNOSIS — L97512 Non-pressure chronic ulcer of other part of right foot with fat layer exposed: Secondary | ICD-10-CM | POA: Diagnosis not present

## 2018-01-20 DIAGNOSIS — M86671 Other chronic osteomyelitis, right ankle and foot: Secondary | ICD-10-CM | POA: Diagnosis not present

## 2018-01-20 DIAGNOSIS — L97513 Non-pressure chronic ulcer of other part of right foot with necrosis of muscle: Secondary | ICD-10-CM | POA: Diagnosis not present

## 2018-01-20 DIAGNOSIS — E11621 Type 2 diabetes mellitus with foot ulcer: Secondary | ICD-10-CM | POA: Diagnosis not present

## 2018-01-21 DIAGNOSIS — D689 Coagulation defect, unspecified: Secondary | ICD-10-CM | POA: Diagnosis not present

## 2018-01-21 DIAGNOSIS — N2581 Secondary hyperparathyroidism of renal origin: Secondary | ICD-10-CM | POA: Diagnosis not present

## 2018-01-21 DIAGNOSIS — N186 End stage renal disease: Secondary | ICD-10-CM | POA: Diagnosis not present

## 2018-01-23 ENCOUNTER — Ambulatory Visit (INDEPENDENT_AMBULATORY_CARE_PROVIDER_SITE_OTHER): Payer: Medicare HMO | Admitting: Physician Assistant

## 2018-01-23 ENCOUNTER — Other Ambulatory Visit: Payer: Self-pay

## 2018-01-23 VITALS — BP 128/66 | HR 69 | Temp 97.5°F | Resp 14 | Ht 64.0 in | Wt 162.0 lb

## 2018-01-23 DIAGNOSIS — Z992 Dependence on renal dialysis: Secondary | ICD-10-CM | POA: Diagnosis not present

## 2018-01-23 DIAGNOSIS — N186 End stage renal disease: Secondary | ICD-10-CM | POA: Diagnosis not present

## 2018-01-23 NOTE — Progress Notes (Signed)
HISTORY AND PHYSICAL     CC:  C/o right arm pain after dialysis stick Requesting Provider:  Vernie Shanks, MD  HPI: This is a 72 y.o. female who dialyzes on T/T/S at [facility].  She states that she went to dialysis on Saturday and the lady stuck her fistula and she developed pain in her upper arm and the lower arm.  Another tech came and moved the needle to a different location and she was able to finish her dialysis session.  She presents today with continued pain in her right arm.  She denies any pain in her hand.    She takes a daily aspirin.  The pt is on a statin for cholesterol management.  She has a hx of nephrectomy.    Past Medical History:  Diagnosis Date  . Arthritis   . Diabetes mellitus    diet controlled-  . Dizziness    occasionally  . ESRD (end stage renal disease) ([place])    TTHS, [place]  . History of colon polyps   . History of gout    takes Allopurinol daily  . Hyperlipidemia    takes Pravastatin daily  . Hypertension    takes Monopril daily  . Joint pain   . Patient is Jehovah's Witness    NO BLOOD OR BLOOD PRODUCTS. Albumin okay.   . Peripheral neuropathy   . Peripheral vascular disease ([place])   . Pneumonia    hx of-yrs ago  . Stroke [facility])     Past Surgical History:  Procedure Laterality Date  . APPENDECTOMY    . BASCILIC VEIN TRANSPOSITION Right 10/03/2013   Procedure: BRACHIOCEPHALIC Arteriovenous Fistula ;  Surgeon: Mal Misty, MD;  Location: [place];  Service: Vascular;  Laterality: Right;  . BASCILIC VEIN TRANSPOSITION Right 11/17/2016   Procedure: BASCILIC VEIN TRANSPOSITION- RIGHT SINGLE STAGE;  Surgeon: Conrad [place], MD;  Location: [place];  Service: Vascular;  Laterality: Right;  . COLONOSCOPY    . cyst removed from lower abdomen  80's  . ESOPHAGOGASTRODUODENOSCOPY    . rotator cuff surgery Right 2013  . TUBAL LIGATION    . UPPER EXTREMITY VENOGRAPHY Bilateral 11/08/2016   Procedure: Bilateral Upper Extremity Venography;   Surgeon: Conrad [place], MD;  Location: [place] [facility];  Service: Cardiovascular;  Laterality: Bilateral;    Allergies  Allergen Reactions  . Tylenol [Acetaminophen] Rash    Current Outpatient Medications  Medication Sig Dispense Refill  . allopurinol (ZYLOPRIM) 100 MG tablet Take 200 mg by mouth daily.     Marland Kitchen aspirin EC 81 MG tablet Take 81 mg by mouth daily.    Marland Kitchen b complex-vitamin c-folic acid (NEPHRO-VITE) 0.8 MG TABS tablet Take 1 tablet by mouth daily.    . Blood Glucose Monitoring Suppl (ACCU-CHEK AVIVA PLUS) w/Device KIT   0  . calcium acetate (PHOSLO) 667 MG capsule Take 667-1,334 mg by mouth See admin instructions. Take 2 caps three times a day with meals. Take 1 cap with snacks twice daily.    . cinacalcet (SENSIPAR) 60 MG tablet Take 60 mg by mouth daily.     . fosinopril (MONOPRIL) 40 MG tablet Take 40 mg by mouth 2 (two) times daily.    Marland Kitchen gabapentin (NEURONTIN) 100 MG capsule Take 1 capsule by mouth every morning and 1 capsule at bedtime. PATIENT NEEDS OFFICE VISIT FOR ADDITIONAL REFILLS (Patient taking differently: Take 100 mg by mouth daily as needed (pain). ) 60 capsule 0  . glucose  blood test strip Use to test blood sugar daily. Dx code: 250.02. 100 each 3  . Lancets MISC Use to test blood sugar daily. Dx code 250.02. 100 each 3  . lidocaine-prilocaine (EMLA) cream Apply 1 application topically 3 (three) times a week. Tuesday, Thursday and Saturday  12  . pravastatin (PRAVACHOL) 20 MG tablet Take 20 mg by mouth at bedtime.      No current facility-administered medications for this visit.     Family History  Problem Relation Age of Onset  . Depression Mother   . Hypertension Mother   . Depression Father   . Depression Sister   . Hypertension Sister     Social History   Socioeconomic History  . Marital status: Widowed    Spouse name: Not on file  . Number of children: Not on file  . Years of education: Not on file  . Highest education level: Not on file    Occupational History  . Not on file  Social Needs  . Financial resource strain: Not on file  . Food insecurity:    Worry: Not on file    Inability: Not on file  . Transportation needs:    Medical: Not on file    Non-medical: Not on file  Tobacco Use  . Smoking status: Former Smoker    Years: 10.00    Types: Cigarettes  . Smokeless tobacco: Never Used  . Tobacco comment: quit smoking over 35+yrs ago  Substance and Sexual Activity  . Alcohol use: No  . Drug use: No  . Sexual activity: Never    Birth control/protection: Post-menopausal  Lifestyle  . Physical activity:    Days per week: Not on file    Minutes per session: Not on file  . Stress: Not on file  Relationships  . Social connections:    Talks on phone: Not on file    Gets together: Not on file    Attends religious service: Not on file    Active member of club or organization: Not on file    Attends meetings of clubs or organizations: Not on file    Relationship status: Not on file  . Intimate partner violence:    Fear of current or ex partner: Not on file    Emotionally abused: Not on file    Physically abused: Not on file    Forced sexual activity: Not on file  Other Topics Concern  . Not on file  Social History Narrative  . Not on file     REVIEW OF SYSTEMS:   [X] denotes positive finding, [ ] denotes negative finding Cardiac  Comments:  Chest pain or chest pressure:    Shortness of breath upon exertion:    Short of breath when lying flat:    Irregular heart rhythm:        Vascular    Pain in calf, thigh, or hip brought on by ambulation: x   Pain in feet at night that wakes you up from your sleep:  x sometimes  Blood clot in your veins:    Leg swelling:         Pulmonary    Oxygen at home:    Productive cough:     Wheezing:         Neurologic    Sudden weakness in arms or legs:     Sudden numbness in arms or legs:  x Right arm  Sudden onset of difficulty speaking or slurred speech:  Temporary loss of vision in one eye:     Problems with dizziness:         Gastrointestinal    Blood in stool:     Vomited blood:         Genitourinary    Burning when urinating:     Blood in urine:        Psychiatric    Major depression:         Hematologic    Bleeding problems:    Problems with blood clotting too easily:        Skin    Rashes or ulcers: x Bruised right arm   x Ulcer right toes-sees Dr. Lyndon Code at the wound center and is also followed by Dr. Gwenlyn Found  Constitutional    Fever or chills:      PHYSICAL EXAMINATION:  Vitals:   01/23/18 1439  BP: 128/66  Pulse: 69  Resp: 14  Temp: (!) 97.5 F (36.4 C)  SpO2: 92%   Vitals:   01/23/18 1439  Weight: 162 lb (73.5 kg)  Height: 5' 4" (1.626 m)   Body mass index is 27.81 kg/m.  General:  WDWN in NAD; vital signs documented above Gait: Not observed HENT: WNL, normocephalic Pulmonary: normal non-labored breathing , without Rales, rhonchi,  wheezing Cardiac:  Regular Skin: bruising RUE Vascular Exam/Pulses: Unable to palpate right radial or ulnar pulse Extremities: RUE AVF has an excellent thrill; there is some swelling and bruising mid upper arm.  Motor and sensation are in tact right hand.  Musculoskeletal: no muscle wasting or atrophy  Neurologic: A&O X 3;  No focal weakness or paresthesias are detected Psychiatric:  The pt has Normal affect.   Non-Invasive Vascular Imaging:   None today  Pt meds includes: Statin:  Yes.   Beta Blocker:  No. Aspirin:  Yes.   ACEI:  Yes.   ARB:  No. CCB use:  No Other Antiplatelet/Anticoagulant:  No   ASSESSMENT/PLAN:: 72 y.o. female with ESRD who dialyzes T/T/S at [facility] center who presents today with infiltrated fistula and painful right arm.   -pt has been having pain in the right arm since dialysis stick on Saturday 2 days ago.  She reports that she had a shooting pain down into her lower arm since then.  The infiltration is mild and fistula is still  easily palpable.  Will let them try to use fistula again tomorrow and if there is any difficulty, she may need a tunneled dialysis catheter to let her fistula rest.  While she has this mild infiltration, would only let experienced tech access her fistula until this has completely healed.  The shooting pains in her lower arm are most likely due to the needle sticking a nerve and should improve over time.    Leontine Locket, PA-C [facility] [phone]  Clinic MD:  Trula Slade

## 2018-01-24 DIAGNOSIS — N2581 Secondary hyperparathyroidism of renal origin: Secondary | ICD-10-CM | POA: Diagnosis not present

## 2018-01-24 DIAGNOSIS — D689 Coagulation defect, unspecified: Secondary | ICD-10-CM | POA: Diagnosis not present

## 2018-01-24 DIAGNOSIS — N186 End stage renal disease: Secondary | ICD-10-CM | POA: Diagnosis not present

## 2018-01-26 ENCOUNTER — Other Ambulatory Visit: Payer: Self-pay | Admitting: *Deleted

## 2018-01-26 DIAGNOSIS — N2581 Secondary hyperparathyroidism of renal origin: Secondary | ICD-10-CM | POA: Diagnosis not present

## 2018-01-26 DIAGNOSIS — N186 End stage renal disease: Secondary | ICD-10-CM | POA: Diagnosis not present

## 2018-01-26 DIAGNOSIS — D689 Coagulation defect, unspecified: Secondary | ICD-10-CM | POA: Diagnosis not present

## 2018-01-26 DIAGNOSIS — I739 Peripheral vascular disease, unspecified: Secondary | ICD-10-CM

## 2018-01-27 ENCOUNTER — Other Ambulatory Visit: Payer: Self-pay | Admitting: Cardiovascular Disease

## 2018-01-27 DIAGNOSIS — R0989 Other specified symptoms and signs involving the circulatory and respiratory systems: Secondary | ICD-10-CM

## 2018-01-27 DIAGNOSIS — I739 Peripheral vascular disease, unspecified: Secondary | ICD-10-CM

## 2018-01-28 DIAGNOSIS — N2581 Secondary hyperparathyroidism of renal origin: Secondary | ICD-10-CM | POA: Diagnosis not present

## 2018-01-28 DIAGNOSIS — N186 End stage renal disease: Secondary | ICD-10-CM | POA: Diagnosis not present

## 2018-01-28 DIAGNOSIS — D689 Coagulation defect, unspecified: Secondary | ICD-10-CM | POA: Diagnosis not present

## 2018-01-30 ENCOUNTER — Encounter (HOSPITAL_COMMUNITY)
Admission: RE | Disposition: A | Discharge status: Home / Self Care | Payer: Self-pay | Source: Ambulatory Visit | Attending: Cardiovascular Disease

## 2018-01-30 ENCOUNTER — Encounter (HOSPITAL_COMMUNITY): Payer: Self-pay | Admitting: Cardiovascular Disease

## 2018-01-30 ENCOUNTER — Other Ambulatory Visit: Payer: Self-pay

## 2018-01-30 ENCOUNTER — Ambulatory Visit (HOSPITAL_COMMUNITY)
Admission: RE | Admit: 2018-01-30 | Discharge: 2018-01-31 | Disposition: A | Discharge status: Home / Self Care | Payer: Medicare HMO | Source: Ambulatory Visit | Attending: Cardiovascular Disease | Admitting: Cardiovascular Disease

## 2018-01-30 DIAGNOSIS — Z886 Allergy status to analgesic agent status: Secondary | ICD-10-CM | POA: Insufficient documentation

## 2018-01-30 DIAGNOSIS — Z9889 Other specified postprocedural states: Secondary | ICD-10-CM | POA: Diagnosis not present

## 2018-01-30 DIAGNOSIS — I70203 Unspecified atherosclerosis of native arteries of extremities, bilateral legs: Secondary | ICD-10-CM | POA: Insufficient documentation

## 2018-01-30 DIAGNOSIS — E11621 Type 2 diabetes mellitus with foot ulcer: Secondary | ICD-10-CM | POA: Diagnosis not present

## 2018-01-30 DIAGNOSIS — Z79899 Other long term (current) drug therapy: Secondary | ICD-10-CM | POA: Diagnosis not present

## 2018-01-30 DIAGNOSIS — E1151 Type 2 diabetes mellitus with diabetic peripheral angiopathy without gangrene: Secondary | ICD-10-CM | POA: Diagnosis not present

## 2018-01-30 DIAGNOSIS — I129 Hypertensive chronic kidney disease with stage 1 through stage 4 chronic kidney disease, or unspecified chronic kidney disease: Secondary | ICD-10-CM | POA: Diagnosis not present

## 2018-01-30 DIAGNOSIS — L97529 Non-pressure chronic ulcer of other part of left foot with unspecified severity: Secondary | ICD-10-CM | POA: Diagnosis not present

## 2018-01-30 DIAGNOSIS — I739 Peripheral vascular disease, unspecified: Secondary | ICD-10-CM

## 2018-01-30 DIAGNOSIS — E1122 Type 2 diabetes mellitus with diabetic chronic kidney disease: Secondary | ICD-10-CM | POA: Diagnosis not present

## 2018-01-30 DIAGNOSIS — Z7982 Long term (current) use of aspirin: Secondary | ICD-10-CM | POA: Diagnosis not present

## 2018-01-30 DIAGNOSIS — I70229 Atherosclerosis of native arteries of extremities with rest pain, unspecified extremity: Secondary | ICD-10-CM | POA: Diagnosis present

## 2018-01-30 DIAGNOSIS — Z87891 Personal history of nicotine dependence: Secondary | ICD-10-CM | POA: Insufficient documentation

## 2018-01-30 DIAGNOSIS — E785 Hyperlipidemia, unspecified: Secondary | ICD-10-CM | POA: Insufficient documentation

## 2018-01-30 DIAGNOSIS — I998 Other disorder of circulatory system: Secondary | ICD-10-CM | POA: Diagnosis not present

## 2018-01-30 DIAGNOSIS — Z905 Acquired absence of kidney: Secondary | ICD-10-CM | POA: Insufficient documentation

## 2018-01-30 DIAGNOSIS — Z992 Dependence on renal dialysis: Secondary | ICD-10-CM | POA: Diagnosis not present

## 2018-01-30 DIAGNOSIS — N189 Chronic kidney disease, unspecified: Secondary | ICD-10-CM | POA: Insufficient documentation

## 2018-01-30 DIAGNOSIS — I70238 Atherosclerosis of native arteries of right leg with ulceration of other part of lower right leg: Secondary | ICD-10-CM | POA: Diagnosis not present

## 2018-01-30 DIAGNOSIS — I70269 Atherosclerosis of native arteries of extremities with gangrene, unspecified extremity: Secondary | ICD-10-CM | POA: Diagnosis present

## 2018-01-30 HISTORY — DX: Peripheral vascular disease, unspecified: I73.9

## 2018-01-30 HISTORY — PX: PERIPHERAL VASCULAR BALLOON ANGIOPLASTY: CATH118281

## 2018-01-30 HISTORY — PX: ABDOMINAL AORTOGRAM W/LOWER EXTREMITY: CATH118223

## 2018-01-30 HISTORY — DX: Type 2 diabetes mellitus without complications: E11.9

## 2018-01-30 HISTORY — DX: Procedure and treatment not carried out because of patient's decision for reasons of belief and group pressure: Z53.1

## 2018-01-30 HISTORY — DX: Reserved for inherently not codable concepts without codable children: IMO0001

## 2018-01-30 LAB — POCT ACTIVATED CLOTTING TIME
ACTIVATED CLOTTING TIME: 263 s
ACTIVATED CLOTTING TIME: 175 s
Activated Clotting Time: 235 seconds
Activated Clotting Time: 252 seconds

## 2018-01-30 LAB — NO BLOOD PRODUCTS

## 2018-01-30 LAB — GLUCOSE, CAPILLARY: Glucose-Capillary: 72 mg/dL (ref 70–99)

## 2018-01-30 SURGERY — ABDOMINAL AORTOGRAM W/LOWER EXTREMITY
Anesthesia: LOCAL | Laterality: Right

## 2018-01-30 MED ORDER — SODIUM CHLORIDE 0.9 % IV SOLN: 250.0000 mL | INTRAVENOUS | Status: DC | PRN

## 2018-01-30 MED ORDER — ASPIRIN EC 81 MG PO TBEC: 81.0000 mg | DELAYED_RELEASE_TABLET | Freq: Every day | ORAL | Status: DC

## 2018-01-30 MED ORDER — ENSURE ENLIVE PO LIQD
237.0000 mL | Freq: Two times a day (BID) | ORAL | Status: DC
  Administered 2018-01-31: 237 mL via ORAL
  Filled 2018-01-30 (×3): qty 237

## 2018-01-30 MED ORDER — HEPARIN SODIUM (PORCINE) 1000 UNIT/ML IJ SOLN
INTRAMUSCULAR | Status: DC | PRN
  Administered 2018-01-30: 8000 [IU] via INTRAVENOUS
  Administered 2018-01-30: 2000 [IU] via INTRAVENOUS

## 2018-01-30 MED ORDER — ASPIRIN 81 MG PO CHEW: 81.0000 mg | CHEWABLE_TABLET | ORAL | Status: DC

## 2018-01-30 MED ORDER — SODIUM CHLORIDE 0.9% FLUSH: 3.0000 mL | Freq: Two times a day (BID) | INTRAVENOUS | Status: DC

## 2018-01-30 MED ORDER — PRAVASTATIN SODIUM 20 MG PO TABS: 20.0000 mg | ORAL_TABLET | Freq: Every day | ORAL | Status: DC

## 2018-01-30 MED ORDER — SODIUM CHLORIDE 0.9% FLUSH: 3.0000 mL | INTRAVENOUS | Status: DC | PRN

## 2018-01-30 MED ORDER — FOSINOPRIL SODIUM 20 MG PO TABS
40.0000 mg | ORAL_TABLET | Freq: Two times a day (BID) | ORAL | Status: DC
  Filled 2018-01-30 (×2): qty 2

## 2018-01-30 MED ORDER — ANGIOPLASTY BOOK
Freq: Once | Status: DC
  Filled 2018-01-30: qty 1

## 2018-01-30 MED ORDER — LIDOCAINE-PRILOCAINE 2.5-2.5 % EX CREA
1.0000 "application " | TOPICAL_CREAM | CUTANEOUS | Status: DC
  Filled 2018-01-30: qty 5

## 2018-01-30 MED ORDER — MORPHINE SULFATE (PF) 10 MG/ML IV SOLN: 2.0000 mg | INTRAVENOUS | Status: DC | PRN

## 2018-01-30 MED ORDER — CLOPIDOGREL BISULFATE 300 MG PO TABS
ORAL_TABLET | ORAL | Status: DC | PRN
  Administered 2018-01-30: 300 mg via ORAL

## 2018-01-30 MED ORDER — ACETAMINOPHEN 325 MG PO TABS: 650.0000 mg | ORAL_TABLET | ORAL | Status: DC | PRN

## 2018-01-30 MED ORDER — SODIUM CHLORIDE 0.9 % IV SOLN
INTRAVENOUS | Status: DC
Start: 2018-01-30 — End: 2018-01-30
  Administered 2018-01-30: 07:00:00 via INTRAVENOUS

## 2018-01-30 MED ORDER — ALLOPURINOL 100 MG PO TABS
200.0000 mg | ORAL_TABLET | Freq: Every day | ORAL | Status: DC
  Administered 2018-01-31: 200 mg via ORAL
  Filled 2018-01-30: qty 2

## 2018-01-30 MED ORDER — CALCIUM ACETATE (PHOS BINDER) 667 MG PO CAPS
1334.0000 mg | ORAL_CAPSULE | Freq: Three times a day (TID) | ORAL | Status: DC
  Administered 2018-01-30 – 2018-01-31 (×2): 1334 mg via ORAL
  Filled 2018-01-30 (×2): qty 2

## 2018-01-30 MED ORDER — SODIUM CHLORIDE 0.9 % WEIGHT BASED INFUSION: 3.0000 mL/kg/h | INTRAVENOUS | Status: DC

## 2018-01-30 MED ORDER — HEPARIN SODIUM (PORCINE) 1000 UNIT/ML IJ SOLN
INTRAMUSCULAR | Status: AC
  Filled 2018-01-30: qty 1

## 2018-01-30 MED ORDER — ONDANSETRON HCL 4 MG/2ML IJ SOLN: 4.0000 mg | Freq: Four times a day (QID) | INTRAMUSCULAR | Status: DC | PRN

## 2018-01-30 MED ORDER — HYDRALAZINE HCL 20 MG/ML IJ SOLN: 5.0000 mg | INTRAMUSCULAR | Status: DC | PRN

## 2018-01-30 MED ORDER — SODIUM CHLORIDE 0.9% FLUSH
3.0000 mL | Freq: Two times a day (BID) | INTRAVENOUS | Status: DC
  Administered 2018-01-30: 21:00:00 3 mL via INTRAVENOUS

## 2018-01-30 MED ORDER — MORPHINE SULFATE (PF) 2 MG/ML IV SOLN: 2.0000 mg | INTRAVENOUS | Status: DC | PRN

## 2018-01-30 MED ORDER — HEPARIN (PORCINE) IN NACL 1000-0.9 UT/500ML-% IV SOLN
INTRAVENOUS | Status: DC | PRN
  Administered 2018-01-30 (×2): 500 mL

## 2018-01-30 MED ORDER — SODIUM CHLORIDE 0.9 % IV SOLN: INTRAVENOUS | Status: DC

## 2018-01-30 MED ORDER — IODIXANOL 320 MG/ML IV SOLN
INTRAVENOUS | Status: DC | PRN
  Administered 2018-01-30: 170 mL via INTRA_ARTERIAL

## 2018-01-30 MED ORDER — CINACALCET HCL 30 MG PO TABS
60.0000 mg | ORAL_TABLET | Freq: Every day | ORAL | Status: DC
  Administered 2018-01-31: 60 mg via ORAL
  Filled 2018-01-30: qty 2

## 2018-01-30 MED ORDER — HEPARIN (PORCINE) IN NACL 1000-0.9 UT/500ML-% IV SOLN
INTRAVENOUS | Status: AC
  Filled 2018-01-30: qty 1000

## 2018-01-30 MED ORDER — SODIUM CHLORIDE 0.9 % WEIGHT BASED INFUSION: 1.0000 mL/kg/h | INTRAVENOUS | Status: DC

## 2018-01-30 MED ORDER — LIDOCAINE HCL (PF) 1 % IJ SOLN
INTRAMUSCULAR | Status: DC | PRN
  Administered 2018-01-30: 30 mL

## 2018-01-30 MED ORDER — LABETALOL HCL 5 MG/ML IV SOLN: 10.0000 mg | INTRAVENOUS | Status: DC | PRN

## 2018-01-30 MED ORDER — ASPIRIN EC 81 MG PO TBEC
81.0000 mg | DELAYED_RELEASE_TABLET | Freq: Every day | ORAL | Status: DC
  Administered 2018-01-31: 81 mg via ORAL
  Filled 2018-01-30: qty 1

## 2018-01-30 MED ORDER — CLOPIDOGREL BISULFATE 300 MG PO TABS
ORAL_TABLET | ORAL | Status: AC
  Filled 2018-01-30: qty 1

## 2018-01-30 MED ORDER — CALCIUM ACETATE (PHOS BINDER) 667 MG PO CAPS: 667.0000 mg | ORAL_CAPSULE | Freq: Two times a day (BID) | ORAL | Status: DC

## 2018-01-30 MED ORDER — LISINOPRIL 40 MG PO TABS
40.0000 mg | ORAL_TABLET | Freq: Two times a day (BID) | ORAL | Status: DC
  Administered 2018-01-30 – 2018-01-31 (×2): 40 mg via ORAL
  Filled 2018-01-30 (×2): qty 1

## 2018-01-30 MED ORDER — ATORVASTATIN CALCIUM 80 MG PO TABS
80.0000 mg | ORAL_TABLET | Freq: Every day | ORAL | Status: DC
  Administered 2018-01-30: 80 mg via ORAL
  Filled 2018-01-30: qty 1

## 2018-01-30 MED ORDER — CLOPIDOGREL BISULFATE 75 MG PO TABS
75.0000 mg | ORAL_TABLET | Freq: Every day | ORAL | Status: DC
  Administered 2018-01-31: 75 mg via ORAL
  Filled 2018-01-30: qty 1

## 2018-01-30 MED ORDER — LIDOCAINE HCL (PF) 1 % IJ SOLN
INTRAMUSCULAR | Status: AC
  Filled 2018-01-30: qty 30

## 2018-01-30 SURGICAL SUPPLY — 24 items
BAG SNAP BAND KOVER 36X36 (MISCELLANEOUS) ×1 IMPLANT
BALLN CHOCOLATE 4.0X40X135 (BALLOONS) ×3
BALLN CHOCOLATE 5.0X40X120 (BALLOONS) ×3
BALLN COYOTE ES OTW 2.5X40X145 (BALLOONS) ×3
BALLN IN.PACT DCB 5X60 (BALLOONS) ×3
BALLOON CHOCOLATE 4.0X40X135 (BALLOONS) IMPLANT
BALLOON CHOCOLATE 5.0X40X120 (BALLOONS) IMPLANT
BALLOON CYTE ES OTW 2.5X40X145 (BALLOONS) IMPLANT
CATH ANGIO 5F PIGTAIL 65CM (CATHETERS) ×1 IMPLANT
CATH CROSS OVER TEMPO 5F (CATHETERS) ×1 IMPLANT
DCB IN.PACT 5X60 (BALLOONS) IMPLANT
DEVICE SPIDERFX EMB PROT 6MM (WIRE) ×1 IMPLANT
KIT ENCORE 26 ADVANTAGE (KITS) ×1 IMPLANT
KIT PV (KITS) ×3 IMPLANT
SHEATH HIGHFLEX ANSEL 7FR 55CM (SHEATH) ×1 IMPLANT
SHEATH PINNACLE 5F 10CM (SHEATH) ×1 IMPLANT
SHEATH PINNACLE 7F 10CM (SHEATH) ×1 IMPLANT
SYR MEDRAD MARK V 150ML (SYRINGE) ×3 IMPLANT
TAPE VIPERTRACK RADIOPAQ (MISCELLANEOUS) IMPLANT
TAPE VIPERTRACK RADIOPAQUE (MISCELLANEOUS) ×3
TRANSDUCER W/STOPCOCK (MISCELLANEOUS) ×3 IMPLANT
TRAY PV CATH (CUSTOM PROCEDURE TRAY) ×3 IMPLANT
WIRE HITORQ VERSACORE ST 145CM (WIRE) ×1 IMPLANT
WIRE SPARTACORE .014X300CM (WIRE) ×1 IMPLANT

## 2018-01-30 NOTE — Progress Notes (Signed)
Ms. Band has ESRD on TTS dialysis and is s/p LE aortogram with PTA and stent of occluded distal right SFA and high grade left SFA stenosis.  She is due for dialysis tomorrow and spending the night in outpatient bed. Her daughter will be able to take her to dialysis tomorrow.  Her usual first shift HD appt has been rescheduled to 12:30 pm and patient is aware of this.  Amalia Hailey, PA-C\ [facility]

## 2018-01-30 NOTE — Progress Notes (Addendum)
Site area: left groin  Site Prior to Removal:  Level 0  Pressure Applied For 30 MINUTES    Minutes Beginning at 1145  Manual:   Yes.    Patient Status During Pull:  stable  Post Pull Groin Site:  Level 0  Post Pull Instructions Given:  Yes.    Post Pull Pulses Present:  Yes.    Dressing Applied:  Yes.    Comments:  Bedrest started at 1215 for 6 hours Bedrest until 1815

## 2018-01-30 NOTE — Interval H&P Note (Signed)
History and Physical Interval Note:  01/30/2018 7:55 AM  Joanne Carlson  has presented today for surgery, with the diagnosis of pvd  The various methods of treatment have been discussed with the patient and family. After consideration of risks, benefits and other options for treatment, the patient has consented to  Procedure(s): ABDOMINAL AORTOGRAM W/LOWER EXTREMITY (N/A) as a surgical intervention .  The patient's history has been reviewed, patient examined, no change in status, stable for surgery.  I have reviewed the patient's chart and labs.  Questions were answered to the patient's satisfaction.     [name]

## 2018-01-30 NOTE — Progress Notes (Signed)
Dr. Gwenlyn Found requested renal be notified of need for HD tomorrow, have spoke to Dr. Moshe Cipro. Pt states she does dialysis [facility] @ [facility], follows with Dr. Posey Pronto. Joanne Bandel PA-C

## 2018-01-30 NOTE — Progress Notes (Signed)
Patient bedrest over at 1815. Up to bathroom and resting in lounge chair for dinner. Site rechecked and bandaid dry and intact, site level 0

## 2018-01-31 DIAGNOSIS — I70203 Unspecified atherosclerosis of native arteries of extremities, bilateral legs: Secondary | ICD-10-CM | POA: Diagnosis not present

## 2018-01-31 DIAGNOSIS — I129 Hypertensive chronic kidney disease with stage 1 through stage 4 chronic kidney disease, or unspecified chronic kidney disease: Secondary | ICD-10-CM | POA: Diagnosis not present

## 2018-01-31 DIAGNOSIS — I998 Other disorder of circulatory system: Secondary | ICD-10-CM

## 2018-01-31 DIAGNOSIS — Z992 Dependence on renal dialysis: Secondary | ICD-10-CM | POA: Diagnosis not present

## 2018-01-31 DIAGNOSIS — D689 Coagulation defect, unspecified: Secondary | ICD-10-CM | POA: Diagnosis not present

## 2018-01-31 DIAGNOSIS — N2581 Secondary hyperparathyroidism of renal origin: Secondary | ICD-10-CM | POA: Diagnosis not present

## 2018-01-31 DIAGNOSIS — L97529 Non-pressure chronic ulcer of other part of left foot with unspecified severity: Secondary | ICD-10-CM | POA: Diagnosis not present

## 2018-01-31 DIAGNOSIS — E11621 Type 2 diabetes mellitus with foot ulcer: Secondary | ICD-10-CM | POA: Diagnosis not present

## 2018-01-31 DIAGNOSIS — I70261 Atherosclerosis of native arteries of extremities with gangrene, right leg: Secondary | ICD-10-CM

## 2018-01-31 DIAGNOSIS — E1122 Type 2 diabetes mellitus with diabetic chronic kidney disease: Secondary | ICD-10-CM | POA: Diagnosis not present

## 2018-01-31 DIAGNOSIS — N189 Chronic kidney disease, unspecified: Secondary | ICD-10-CM | POA: Diagnosis not present

## 2018-01-31 DIAGNOSIS — E1151 Type 2 diabetes mellitus with diabetic peripheral angiopathy without gangrene: Secondary | ICD-10-CM | POA: Diagnosis not present

## 2018-01-31 DIAGNOSIS — N186 End stage renal disease: Secondary | ICD-10-CM | POA: Diagnosis not present

## 2018-01-31 LAB — BASIC METABOLIC PANEL
Anion gap: 13 (ref 5–15)
BUN: 53 mg/dL — ABNORMAL HIGH (ref 8–23)
CO2: 25 mmol/L (ref 22–32)
CREATININE: 7.29 mg/dL — AB (ref 0.44–1.00)
Calcium: 7.6 mg/dL — ABNORMAL LOW (ref 8.9–10.3)
Chloride: 90 mmol/L — ABNORMAL LOW (ref 98–111)
GFR calc Af Amer: 6 mL/min — ABNORMAL LOW (ref >60)
GFR calc non Af Amer: 5 mL/min — ABNORMAL LOW (ref >60)
Glucose, Bld: 71 mg/dL (ref 70–99)
Potassium: 5.8 mmol/L — ABNORMAL HIGH (ref 3.5–5.1)
Sodium: 128 mmol/L — ABNORMAL LOW (ref 135–145)

## 2018-01-31 LAB — CBC
HCT: 34.5 % — ABNORMAL LOW (ref 36.0–46.0)
Hemoglobin: 10.8 g/dL — ABNORMAL LOW (ref 12.0–15.0)
MCH: 29.8 pg (ref 26.0–34.0)
MCHC: 31.3 g/dL (ref 30.0–36.0)
MCV: 95 fL (ref 78.0–100.0)
Platelets: 175 10*3/uL (ref 150–400)
RBC: 3.63 MIL/uL — ABNORMAL LOW (ref 3.87–5.11)
RDW: 15.5 % (ref 11.5–15.5)
WBC: 10.2 10*3/uL (ref 4.0–10.5)

## 2018-01-31 MED ORDER — CLOPIDOGREL BISULFATE 75 MG PO TABS: 75.0000 mg | ORAL_TABLET | Freq: Every day | ORAL | 1 refills | Status: DC

## 2018-01-31 MED ORDER — ATORVASTATIN CALCIUM 80 MG PO TABS: 80.0000 mg | ORAL_TABLET | Freq: Every day | ORAL | 0 refills | Status: DC

## 2018-01-31 NOTE — Discharge Summary (Addendum)
Discharge Summary    Patient ID: Joanne Carlson,  MRN: [id number], DOB/AGE: [birth year] 72 y.o.  Admit date: 01/30/2018 Discharge date: 01/31/2018  Primary Care Provider: Vernie Shanks Primary Cardiologist: Dr. Gwenlyn Found   Discharge Diagnoses    Active Problems:   Critical lower limb ischemia   Atherosclerosis of native arteries of the extremities with gangrene [facility])   Allergies Allergies  Allergen Reactions  . Tylenol [Acetaminophen] Rash    Diagnostic Studies/Procedures    PV Angiogram: 01/30/18  Angiographic Data:   1: Abdominal aorta- distal abdominal aorta with fluoroscopically calcified but not aneurysmal or occlusive. 2: Left lower extremity- there was a 95% calcified mid left SFA with three-vessel runoff.  The anterior tibial was the dominant vessel 3: Right lower extremity- the mid right SFA was highly calcified fluoroscopically.  There was two-vessel runoff with an occluded posterior tibial and diseased peroneal.  There was a 95 % calcified mid right SFA stenosis.  IMPRESSION: Ms. Joanne Carlson has a 99% calcified subtotal mid right SFA stenosis with one good vessel runoff via the anterior tibial.  We will proceed with percutaneous revascularization.  Procedure Description: Contralateral access was obtained with a crossover catheter, 035 versa core wire and a 7 [place] 55 cm multipurpose Ansell sheath.  The patient received 10,000 units of heparin with a peak ACT of 263.  Total contrast administered to the patient with 170 cc.  On the pigtail was used for the entirety of the case.  Retrograde aortic pressure was monitored during the case.  I was able to cross the lesion with some difficulty with an 014 [place] core wire.  I was unable to to cross with a spider distal protection device even after predilated with a 2.5 mm balloon.  My initial intent was to perform directional atherectomy however it was unlikely that a [facility] one device with cross the lesion I therefore defaulted  to chocolate balloon/Cutting Balloon atherectomy beginning with a 4 mm and upgrading to a 5 mm x 4 cm balloon.  Following this I performed drug-eluting balloon angioplasty with a 5 mm x 60 mm long Admiral drug-eluting balloon at 8 atm for 2-1/2 minutes with final reduction of a 99% calcified mid right SFA stenosis to less than 10% residual without dissection.  The patient received 100 mg p.o. Plavix at the Carlson of the case.  The sheath was removed across the bifurcation and exchanged over an 035 wire for a short 7 [place] sheath which was then secured in place.  Final Impression: Successful chocolate Cutting Balloon atherectomy followed by drug-eluting balloon angioplasty of the 9% calcified mid right SFA stenosis in the setting of critical limb ischemia.  The sheath will be removed once ACT falls below 170 and pressure held.  She will be treated with dual antiplatelet therapy including aspirin Plavix. We will obtain lower extremity arterial Doppler studies in our [facility] line office next week and I will see her back 2 to 3 weeks thereafter.  [name]. MD, [facility] _____________   History of Present Illness     Joanne Carlson is a 72 y.o. who is followed by Dr. Gwenlyn Found for peripheral vascular evaluation because of critical limb ischemia.She has a history of treated hypertension, diabetes and hyperlipidemia. 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 Dr. Adele Barthel placed an AV fistula. She's never had a heart attack or stroke. She denied chest pain or shortness of breath. She's had a small non-healing ulcer on  her left great toe treated with antibiotics recently and according to her that was slowly healing. Lower extremity arterial Doppler studies performed in our office 01/03/17 revealed ABIs approximately 0.8 bilaterally with an occluded distal right SFA and high-grade left SFA stenosis with three-vessel runoff.  She was seen approximately 2 months ago by Dr. Gwenlyn Found and  her left great toe ulcer has healed however she had developed a gangrenous right great toe. It was felt that she would need percutaneous revascularization to facilitate wound healing.  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  She is being followed by Dr. Dellia Nims at the wound care center.  Her wound really had not healed over the last 2-1/2 months.  She was now ready to proceed with angiography and percutaneous intervention for limb salvage.  Hospital Course     Underwent successful chocolate cutting Balloon atherectomy followed by drug-eluting balloon angioplasty of a calcified mid right SFA stenosis. Plan for DAPT with ASA/plavix. Nephrology was contacted for HD needs and had her HD rescheduled for the afternoon following her discharge. No complications noted overnight. Morning labs showed Na+ 128. K+ 5.8, and Hgb 10.8, but she was due for you routine dialysis which was scheduled for 12:30pm on the day of discharge. She was able to ambulate without difficulty.   General: Well developed, well nourished, female appearing in no acute distress. Head: Normocephalic, atraumatic.  Neck: Supple without bruits, JVD. Lungs:  Resp regular and unlabored, CTA. Heart: RRR, S1, S2, soft 2/6 systolic murmur; no rub. Abdomen: Soft, non-tender, non-distended with normoactive bowel sounds.  Extremities: No clubbing, cyanosis, edema. Distal pedal pulses are 2+ bilaterally. L femoral cath site stable without bruising or hematoma Neuro: Alert and oriented X 3. Moves all extremities spontaneously. Psych: Normal affect.  Joanne Carlson was seen by Dr. Claiborne Billings and determined stable for discharge home. Follow up in the office has been arranged. Medications are listed below.   _____________  Discharge Vitals Blood pressure (!) 116/56, pulse 85, temperature 97.8 F (36.6 C), temperature source Oral, resp. rate 17, height 5' 4"  (1.626 m), weight 167  lb 5.3 oz (75.9 kg), SpO2 97 %.  Filed Weights   01/30/18 0549 01/31/18 0521  Weight: 162 lb (73.5 kg) 167 lb 5.3 oz (75.9 kg)    Labs & Radiologic Studies    CBC Recent Labs    01/31/18 0244  WBC 10.2  HGB 10.8*  HCT 34.5*  MCV 95.0  PLT 277   Basic Metabolic Panel Recent Labs    01/31/18 0244  NA 128*  K 5.8*  CL 90*  CO2 25  GLUCOSE 71  BUN 53*  CREATININE 7.29*  CALCIUM 7.6*   Liver Function Tests No results for input(s): AST, ALT, ALKPHOS, BILITOT, PROT, ALBUMIN in the last 72 hours. No results for input(s): LIPASE, AMYLASE in the last 72 hours. Cardiac Enzymes No results for input(s): CKTOTAL, CKMB, CKMBINDEX, TROPONINI in the last 72 hours. BNP Invalid input(s): POCBNP D-Dimer No results for input(s): DDIMER in the last 72 hours. Hemoglobin A1C No results for input(s): HGBA1C in the last 72 hours. Fasting Lipid Panel No results for input(s): CHOL, HDL, LDLCALC, TRIG, CHOLHDL, LDLDIRECT in the last 72 hours. Thyroid Function Tests No results for input(s): TSH, T4TOTAL, T3FREE, THYROIDAB in the last 72 hours.  Invalid input(s): FREET3 _____________  No results found. Disposition   Pt is being discharged home today in good condition.  Follow-up Plans &  Appointments    Follow-up Information    [facility] Follow up on 02/15/2018.   Specialty:  Cardiology Why:  at [place] for your follow up dopplers.  Contact information: [address] [place]       Lorretta Harp, MD Follow up on 03/03/2018.   Specialties:  Cardiology, Radiology Why:  at 10:30am for your follow up appt.  Contact information: [address] [place] [address] [phone]          Discharge Instructions    Call MD for:  redness, tenderness, or signs of infection (pain, swelling, redness, odor or green/yellow discharge around incision site)   Complete by:  As directed    Diet - low sodium  heart healthy   Complete by:  As directed    Discharge instructions   Complete by:  As directed    Groin Site Care Refer to this sheet in the next few weeks. These instructions provide you with information on caring for yourself after your procedure. Your caregiver may also give you more specific instructions. Your treatment has been planned according to current medical practices, but problems sometimes occur. Call your caregiver if you have any problems or questions after your procedure. HOME CARE INSTRUCTIONS You may shower 24 hours after the procedure. Remove the bandage (dressing) and gently wash the site with plain soap and water. Gently pat the site dry.  Do not apply powder or lotion to the site.  Do not sit in a bathtub, swimming pool, or whirlpool for 5 to 7 days.  No bending, squatting, or lifting anything over 10 pounds (4.5 kg) as directed by your caregiver.  Inspect the site at least twice daily.  Do not drive home if you are discharged the same day of the procedure. Have someone else drive you.  You may drive 24 hours after the procedure unless otherwise instructed by your caregiver.  What to expect: Any bruising will usually fade within 1 to 2 weeks.  Blood that collects in the tissue (hematoma) may be painful to the touch. It should usually decrease in size and tenderness within 1 to 2 weeks.  SEEK IMMEDIATE MEDICAL CARE IF: You have unusual pain at the groin site or down the affected leg.  You have redness, warmth, swelling, or pain at the groin site.  You have drainage (other than a small amount of blood on the dressing).  You have chills.  You have a fever or persistent symptoms for more than 72 hours.  You have a fever and your symptoms suddenly get worse.  Your leg becomes pale, cool, tingly, or numb.  You have heavy bleeding from the site. Hold pressure on the site. Marland Kitchen  PLEASE DO NOT MISS ANY DOSES OF YOUR PLAVIX!!!!! Also keep a log of you blood pressures and bring  back to your follow up appt. Please call the office with any questions.   Patients taking blood thinners should generally stay away from medicines like ibuprofen, Advil, Motrin, naproxen, and Aleve due to risk of stomach bleeding. You may take Tylenol as directed or talk to your primary doctor about alternatives.   Increase activity slowly   Complete by:  As directed      Discharge Medications     Medication List    STOP taking these medications   pravastatin 20 MG tablet Commonly known as:  PRAVACHOL     TAKE these medications   ACCU-CHEK AVIVA PLUS w/Device  Kit   allopurinol 100 MG tablet Commonly known as:  ZYLOPRIM Take 200 mg by mouth daily.   aspirin EC 81 MG tablet Take 81 mg by mouth daily.   atorvastatin 80 MG tablet Commonly known as:  LIPITOR Take 1 tablet (80 mg total) by mouth daily at 6 PM.   b complex-vitamin c-folic acid 0.8 MG Tabs tablet Take 1 tablet by mouth daily.   calcium acetate 667 MG capsule Commonly known as:  PHOSLO Take 667-1,334 mg by mouth See admin instructions. Take 2 caps three times a day with meals. Take 1 cap with snacks twice daily.   cinacalcet 60 MG tablet Commonly known as:  SENSIPAR Take 60 mg by mouth daily.   clopidogrel 75 MG tablet Commonly known as:  PLAVIX Take 1 tablet (75 mg total) by mouth daily with breakfast.   fosinopril 40 MG tablet Commonly known as:  MONOPRIL Take 40 mg by mouth 2 (two) times daily.   gabapentin 100 MG capsule Commonly known as:  NEURONTIN Take 1 capsule by mouth every morning and 1 capsule at bedtime. PATIENT NEEDS OFFICE VISIT FOR ADDITIONAL REFILLS What changed:    how much to take  how to take this  when to take this  reasons to take this  additional instructions   glucose blood test strip Use to test blood sugar daily. Dx code: 79.02.   Lancets Misc Use to test blood sugar daily. Dx code 250.02.   lidocaine-prilocaine cream Commonly known as:  EMLA Apply 1  application topically 3 (three) times a week. Tuesday, Thursday and Saturday        Acute coronary syndrome (MI, NSTEMI, STEMI, etc) this admission?: No.     Outstanding Labs/Studies   Follow up dopplers.   Duration of Discharge Encounter   Greater than 30 minutes including physician time.  Signed, Reino Bellis NP-C 01/31/2018, 8:31 AM    Patient seen and examined. Agree with assessment and plan. Feels well. Able to walk without claudication symptoms. R leg warm. L groin cath site stable.  Stable for DC this am with outpatient dialysis this afternoon; f/u with Dr. Emilio Math, MD, [facility] 01/31/2018 8:41 AM

## 2018-02-01 DIAGNOSIS — N186 End stage renal disease: Secondary | ICD-10-CM | POA: Diagnosis not present

## 2018-02-01 DIAGNOSIS — Z992 Dependence on renal dialysis: Secondary | ICD-10-CM | POA: Diagnosis not present

## 2018-02-01 DIAGNOSIS — E1122 Type 2 diabetes mellitus with diabetic chronic kidney disease: Secondary | ICD-10-CM | POA: Diagnosis not present

## 2018-02-02 ENCOUNTER — Encounter (HOSPITAL_COMMUNITY): Payer: Self-pay | Admitting: Cardiovascular Disease

## 2018-02-02 ENCOUNTER — Encounter (HOSPITAL_BASED_OUTPATIENT_CLINIC_OR_DEPARTMENT_OTHER): Payer: Medicare HMO | Attending: Internal Medicine

## 2018-02-02 DIAGNOSIS — I12 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 stage renal disease: Secondary | ICD-10-CM | POA: Insufficient documentation

## 2018-02-02 DIAGNOSIS — E1129 Type 2 diabetes mellitus with other diabetic kidney complication: Secondary | ICD-10-CM | POA: Diagnosis not present

## 2018-02-02 DIAGNOSIS — N2581 Secondary hyperparathyroidism of renal origin: Secondary | ICD-10-CM | POA: Diagnosis not present

## 2018-02-02 DIAGNOSIS — D631 Anemia in chronic kidney disease: Secondary | ICD-10-CM | POA: Diagnosis not present

## 2018-02-02 DIAGNOSIS — E1151 Type 2 diabetes mellitus with diabetic peripheral angiopathy without gangrene: Secondary | ICD-10-CM | POA: Insufficient documentation

## 2018-02-02 DIAGNOSIS — Z87891 Personal history of nicotine dependence: Secondary | ICD-10-CM | POA: Insufficient documentation

## 2018-02-02 DIAGNOSIS — D689 Coagulation defect, unspecified: Secondary | ICD-10-CM | POA: Diagnosis not present

## 2018-02-02 DIAGNOSIS — E1122 Type 2 diabetes mellitus with diabetic chronic kidney disease: Secondary | ICD-10-CM | POA: Insufficient documentation

## 2018-02-02 DIAGNOSIS — N186 End stage renal disease: Secondary | ICD-10-CM | POA: Diagnosis not present

## 2018-02-02 DIAGNOSIS — E1140 Type 2 diabetes mellitus with diabetic neuropathy, unspecified: Secondary | ICD-10-CM | POA: Insufficient documentation

## 2018-02-02 DIAGNOSIS — D509 Iron deficiency anemia, unspecified: Secondary | ICD-10-CM | POA: Diagnosis not present

## 2018-02-02 DIAGNOSIS — E11621 Type 2 diabetes mellitus with foot ulcer: Secondary | ICD-10-CM | POA: Insufficient documentation

## 2018-02-02 DIAGNOSIS — L97512 Non-pressure chronic ulcer of other part of right foot with fat layer exposed: Secondary | ICD-10-CM | POA: Insufficient documentation

## 2018-02-04 DIAGNOSIS — N2581 Secondary hyperparathyroidism of renal origin: Secondary | ICD-10-CM | POA: Diagnosis not present

## 2018-02-04 DIAGNOSIS — D631 Anemia in chronic kidney disease: Secondary | ICD-10-CM | POA: Diagnosis not present

## 2018-02-04 DIAGNOSIS — D509 Iron deficiency anemia, unspecified: Secondary | ICD-10-CM | POA: Diagnosis not present

## 2018-02-04 DIAGNOSIS — E1129 Type 2 diabetes mellitus with other diabetic kidney complication: Secondary | ICD-10-CM | POA: Diagnosis not present

## 2018-02-04 DIAGNOSIS — N186 End stage renal disease: Secondary | ICD-10-CM | POA: Diagnosis not present

## 2018-02-04 DIAGNOSIS — D689 Coagulation defect, unspecified: Secondary | ICD-10-CM | POA: Diagnosis not present

## 2018-02-06 ENCOUNTER — Other Ambulatory Visit: Payer: Self-pay | Admitting: Cardiovascular Disease

## 2018-02-06 DIAGNOSIS — I739 Peripheral vascular disease, unspecified: Secondary | ICD-10-CM

## 2018-02-06 DIAGNOSIS — R0989 Other specified symptoms and signs involving the circulatory and respiratory systems: Secondary | ICD-10-CM

## 2018-02-06 DIAGNOSIS — Z9862 Peripheral vascular angioplasty status: Secondary | ICD-10-CM

## 2018-02-07 DIAGNOSIS — D631 Anemia in chronic kidney disease: Secondary | ICD-10-CM | POA: Diagnosis not present

## 2018-02-07 DIAGNOSIS — D509 Iron deficiency anemia, unspecified: Secondary | ICD-10-CM | POA: Diagnosis not present

## 2018-02-07 DIAGNOSIS — N186 End stage renal disease: Secondary | ICD-10-CM | POA: Diagnosis not present

## 2018-02-07 DIAGNOSIS — D689 Coagulation defect, unspecified: Secondary | ICD-10-CM | POA: Diagnosis not present

## 2018-02-07 DIAGNOSIS — E1129 Type 2 diabetes mellitus with other diabetic kidney complication: Secondary | ICD-10-CM | POA: Diagnosis not present

## 2018-02-07 DIAGNOSIS — N2581 Secondary hyperparathyroidism of renal origin: Secondary | ICD-10-CM | POA: Diagnosis not present

## 2018-02-08 DIAGNOSIS — N186 End stage renal disease: Secondary | ICD-10-CM | POA: Diagnosis not present

## 2018-02-08 DIAGNOSIS — E11621 Type 2 diabetes mellitus with foot ulcer: Secondary | ICD-10-CM | POA: Diagnosis not present

## 2018-02-08 DIAGNOSIS — Z87891 Personal history of nicotine dependence: Secondary | ICD-10-CM | POA: Diagnosis not present

## 2018-02-08 DIAGNOSIS — E1140 Type 2 diabetes mellitus with diabetic neuropathy, unspecified: Secondary | ICD-10-CM | POA: Diagnosis not present

## 2018-02-08 DIAGNOSIS — L97512 Non-pressure chronic ulcer of other part of right foot with fat layer exposed: Secondary | ICD-10-CM | POA: Diagnosis not present

## 2018-02-08 DIAGNOSIS — E1151 Type 2 diabetes mellitus with diabetic peripheral angiopathy without gangrene: Secondary | ICD-10-CM | POA: Diagnosis not present

## 2018-02-08 DIAGNOSIS — I12 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 stage renal disease: Secondary | ICD-10-CM | POA: Diagnosis not present

## 2018-02-08 DIAGNOSIS — E1122 Type 2 diabetes mellitus with diabetic chronic kidney disease: Secondary | ICD-10-CM | POA: Diagnosis not present

## 2018-02-09 DIAGNOSIS — D509 Iron deficiency anemia, unspecified: Secondary | ICD-10-CM | POA: Diagnosis not present

## 2018-02-09 DIAGNOSIS — D631 Anemia in chronic kidney disease: Secondary | ICD-10-CM | POA: Diagnosis not present

## 2018-02-09 DIAGNOSIS — N186 End stage renal disease: Secondary | ICD-10-CM | POA: Diagnosis not present

## 2018-02-09 DIAGNOSIS — D689 Coagulation defect, unspecified: Secondary | ICD-10-CM | POA: Diagnosis not present

## 2018-02-09 DIAGNOSIS — N2581 Secondary hyperparathyroidism of renal origin: Secondary | ICD-10-CM | POA: Diagnosis not present

## 2018-02-09 DIAGNOSIS — E1129 Type 2 diabetes mellitus with other diabetic kidney complication: Secondary | ICD-10-CM | POA: Diagnosis not present

## 2018-02-10 DIAGNOSIS — E1129 Type 2 diabetes mellitus with other diabetic kidney complication: Secondary | ICD-10-CM | POA: Diagnosis not present

## 2018-02-10 DIAGNOSIS — D689 Coagulation defect, unspecified: Secondary | ICD-10-CM | POA: Diagnosis not present

## 2018-02-10 DIAGNOSIS — N186 End stage renal disease: Secondary | ICD-10-CM | POA: Diagnosis not present

## 2018-02-10 DIAGNOSIS — N2581 Secondary hyperparathyroidism of renal origin: Secondary | ICD-10-CM | POA: Diagnosis not present

## 2018-02-10 DIAGNOSIS — D631 Anemia in chronic kidney disease: Secondary | ICD-10-CM | POA: Diagnosis not present

## 2018-02-10 DIAGNOSIS — D509 Iron deficiency anemia, unspecified: Secondary | ICD-10-CM | POA: Diagnosis not present

## 2018-02-14 DIAGNOSIS — D509 Iron deficiency anemia, unspecified: Secondary | ICD-10-CM | POA: Diagnosis not present

## 2018-02-14 DIAGNOSIS — N2581 Secondary hyperparathyroidism of renal origin: Secondary | ICD-10-CM | POA: Diagnosis not present

## 2018-02-14 DIAGNOSIS — N186 End stage renal disease: Secondary | ICD-10-CM | POA: Diagnosis not present

## 2018-02-14 DIAGNOSIS — D631 Anemia in chronic kidney disease: Secondary | ICD-10-CM | POA: Diagnosis not present

## 2018-02-14 DIAGNOSIS — D689 Coagulation defect, unspecified: Secondary | ICD-10-CM | POA: Diagnosis not present

## 2018-02-14 DIAGNOSIS — E1129 Type 2 diabetes mellitus with other diabetic kidney complication: Secondary | ICD-10-CM | POA: Diagnosis not present

## 2018-02-15 ENCOUNTER — Inpatient Hospital Stay (HOSPITAL_COMMUNITY): Admit: 2018-02-15 | Payer: Medicare HMO

## 2018-02-15 ENCOUNTER — Ambulatory Visit (HOSPITAL_COMMUNITY)
Admission: RE | Admit: 2018-02-15 | Discharge: 2018-02-15 | Disposition: A | Discharge status: Home / Self Care | Payer: Medicare HMO | Source: Ambulatory Visit | Attending: Cardiology | Admitting: Cardiology

## 2018-02-15 DIAGNOSIS — Z9862 Peripheral vascular angioplasty status: Secondary | ICD-10-CM | POA: Diagnosis not present

## 2018-02-15 DIAGNOSIS — R0989 Other specified symptoms and signs involving the circulatory and respiratory systems: Secondary | ICD-10-CM

## 2018-02-15 DIAGNOSIS — I739 Peripheral vascular disease, unspecified: Secondary | ICD-10-CM | POA: Diagnosis not present

## 2018-02-16 DIAGNOSIS — D689 Coagulation defect, unspecified: Secondary | ICD-10-CM | POA: Diagnosis not present

## 2018-02-16 DIAGNOSIS — N186 End stage renal disease: Secondary | ICD-10-CM | POA: Diagnosis not present

## 2018-02-16 DIAGNOSIS — Z87891 Personal history of nicotine dependence: Secondary | ICD-10-CM | POA: Diagnosis not present

## 2018-02-16 DIAGNOSIS — N2581 Secondary hyperparathyroidism of renal origin: Secondary | ICD-10-CM | POA: Diagnosis not present

## 2018-02-16 DIAGNOSIS — L97512 Non-pressure chronic ulcer of other part of right foot with fat layer exposed: Secondary | ICD-10-CM | POA: Diagnosis not present

## 2018-02-16 DIAGNOSIS — E1122 Type 2 diabetes mellitus with diabetic chronic kidney disease: Secondary | ICD-10-CM | POA: Diagnosis not present

## 2018-02-16 DIAGNOSIS — I12 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 stage renal disease: Secondary | ICD-10-CM | POA: Diagnosis not present

## 2018-02-16 DIAGNOSIS — D509 Iron deficiency anemia, unspecified: Secondary | ICD-10-CM | POA: Diagnosis not present

## 2018-02-16 DIAGNOSIS — E1140 Type 2 diabetes mellitus with diabetic neuropathy, unspecified: Secondary | ICD-10-CM | POA: Diagnosis not present

## 2018-02-16 DIAGNOSIS — E1129 Type 2 diabetes mellitus with other diabetic kidney complication: Secondary | ICD-10-CM | POA: Diagnosis not present

## 2018-02-16 DIAGNOSIS — E11621 Type 2 diabetes mellitus with foot ulcer: Secondary | ICD-10-CM | POA: Diagnosis not present

## 2018-02-16 DIAGNOSIS — D631 Anemia in chronic kidney disease: Secondary | ICD-10-CM | POA: Diagnosis not present

## 2018-02-16 DIAGNOSIS — E1151 Type 2 diabetes mellitus with diabetic peripheral angiopathy without gangrene: Secondary | ICD-10-CM | POA: Diagnosis not present

## 2018-02-18 DIAGNOSIS — D509 Iron deficiency anemia, unspecified: Secondary | ICD-10-CM | POA: Diagnosis not present

## 2018-02-18 DIAGNOSIS — D631 Anemia in chronic kidney disease: Secondary | ICD-10-CM | POA: Diagnosis not present

## 2018-02-18 DIAGNOSIS — E1129 Type 2 diabetes mellitus with other diabetic kidney complication: Secondary | ICD-10-CM | POA: Diagnosis not present

## 2018-02-18 DIAGNOSIS — N186 End stage renal disease: Secondary | ICD-10-CM | POA: Diagnosis not present

## 2018-02-18 DIAGNOSIS — D689 Coagulation defect, unspecified: Secondary | ICD-10-CM | POA: Diagnosis not present

## 2018-02-18 DIAGNOSIS — N2581 Secondary hyperparathyroidism of renal origin: Secondary | ICD-10-CM | POA: Diagnosis not present

## 2018-02-21 DIAGNOSIS — D509 Iron deficiency anemia, unspecified: Secondary | ICD-10-CM | POA: Diagnosis not present

## 2018-02-21 DIAGNOSIS — E1129 Type 2 diabetes mellitus with other diabetic kidney complication: Secondary | ICD-10-CM | POA: Diagnosis not present

## 2018-02-21 DIAGNOSIS — D631 Anemia in chronic kidney disease: Secondary | ICD-10-CM | POA: Diagnosis not present

## 2018-02-21 DIAGNOSIS — N186 End stage renal disease: Secondary | ICD-10-CM | POA: Diagnosis not present

## 2018-02-21 DIAGNOSIS — D689 Coagulation defect, unspecified: Secondary | ICD-10-CM | POA: Diagnosis not present

## 2018-02-21 DIAGNOSIS — N2581 Secondary hyperparathyroidism of renal origin: Secondary | ICD-10-CM | POA: Diagnosis not present

## 2018-02-23 DIAGNOSIS — N2581 Secondary hyperparathyroidism of renal origin: Secondary | ICD-10-CM | POA: Diagnosis not present

## 2018-02-23 DIAGNOSIS — D631 Anemia in chronic kidney disease: Secondary | ICD-10-CM | POA: Diagnosis not present

## 2018-02-23 DIAGNOSIS — D689 Coagulation defect, unspecified: Secondary | ICD-10-CM | POA: Diagnosis not present

## 2018-02-23 DIAGNOSIS — D509 Iron deficiency anemia, unspecified: Secondary | ICD-10-CM | POA: Diagnosis not present

## 2018-02-23 DIAGNOSIS — N186 End stage renal disease: Secondary | ICD-10-CM | POA: Diagnosis not present

## 2018-02-23 DIAGNOSIS — E1129 Type 2 diabetes mellitus with other diabetic kidney complication: Secondary | ICD-10-CM | POA: Diagnosis not present

## 2018-02-24 DIAGNOSIS — E11621 Type 2 diabetes mellitus with foot ulcer: Secondary | ICD-10-CM | POA: Diagnosis not present

## 2018-02-24 DIAGNOSIS — L97512 Non-pressure chronic ulcer of other part of right foot with fat layer exposed: Secondary | ICD-10-CM | POA: Diagnosis not present

## 2018-02-24 DIAGNOSIS — I12 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 stage renal disease: Secondary | ICD-10-CM | POA: Diagnosis not present

## 2018-02-24 DIAGNOSIS — M86671 Other chronic osteomyelitis, right ankle and foot: Secondary | ICD-10-CM | POA: Diagnosis not present

## 2018-02-24 DIAGNOSIS — E1122 Type 2 diabetes mellitus with diabetic chronic kidney disease: Secondary | ICD-10-CM | POA: Diagnosis not present

## 2018-02-24 DIAGNOSIS — N186 End stage renal disease: Secondary | ICD-10-CM | POA: Diagnosis not present

## 2018-02-24 DIAGNOSIS — E1151 Type 2 diabetes mellitus with diabetic peripheral angiopathy without gangrene: Secondary | ICD-10-CM | POA: Diagnosis not present

## 2018-02-24 DIAGNOSIS — Z87891 Personal history of nicotine dependence: Secondary | ICD-10-CM | POA: Diagnosis not present

## 2018-02-24 DIAGNOSIS — E1140 Type 2 diabetes mellitus with diabetic neuropathy, unspecified: Secondary | ICD-10-CM | POA: Diagnosis not present

## 2018-02-25 DIAGNOSIS — D509 Iron deficiency anemia, unspecified: Secondary | ICD-10-CM | POA: Diagnosis not present

## 2018-02-25 DIAGNOSIS — E1129 Type 2 diabetes mellitus with other diabetic kidney complication: Secondary | ICD-10-CM | POA: Diagnosis not present

## 2018-02-25 DIAGNOSIS — N186 End stage renal disease: Secondary | ICD-10-CM | POA: Diagnosis not present

## 2018-02-25 DIAGNOSIS — D631 Anemia in chronic kidney disease: Secondary | ICD-10-CM | POA: Diagnosis not present

## 2018-02-25 DIAGNOSIS — N2581 Secondary hyperparathyroidism of renal origin: Secondary | ICD-10-CM | POA: Diagnosis not present

## 2018-02-25 DIAGNOSIS — D689 Coagulation defect, unspecified: Secondary | ICD-10-CM | POA: Diagnosis not present

## 2018-02-27 DIAGNOSIS — D509 Iron deficiency anemia, unspecified: Secondary | ICD-10-CM | POA: Insufficient documentation

## 2018-02-28 DIAGNOSIS — E1129 Type 2 diabetes mellitus with other diabetic kidney complication: Secondary | ICD-10-CM | POA: Diagnosis not present

## 2018-02-28 DIAGNOSIS — D509 Iron deficiency anemia, unspecified: Secondary | ICD-10-CM | POA: Diagnosis not present

## 2018-02-28 DIAGNOSIS — N2581 Secondary hyperparathyroidism of renal origin: Secondary | ICD-10-CM | POA: Diagnosis not present

## 2018-02-28 DIAGNOSIS — D631 Anemia in chronic kidney disease: Secondary | ICD-10-CM | POA: Diagnosis not present

## 2018-02-28 DIAGNOSIS — N186 End stage renal disease: Secondary | ICD-10-CM | POA: Diagnosis not present

## 2018-02-28 DIAGNOSIS — D689 Coagulation defect, unspecified: Secondary | ICD-10-CM | POA: Diagnosis not present

## 2018-03-02 DIAGNOSIS — E1129 Type 2 diabetes mellitus with other diabetic kidney complication: Secondary | ICD-10-CM | POA: Diagnosis not present

## 2018-03-02 DIAGNOSIS — N186 End stage renal disease: Secondary | ICD-10-CM | POA: Diagnosis not present

## 2018-03-02 DIAGNOSIS — D631 Anemia in chronic kidney disease: Secondary | ICD-10-CM | POA: Diagnosis not present

## 2018-03-02 DIAGNOSIS — D689 Coagulation defect, unspecified: Secondary | ICD-10-CM | POA: Diagnosis not present

## 2018-03-02 DIAGNOSIS — N2581 Secondary hyperparathyroidism of renal origin: Secondary | ICD-10-CM | POA: Diagnosis not present

## 2018-03-02 DIAGNOSIS — D509 Iron deficiency anemia, unspecified: Secondary | ICD-10-CM | POA: Diagnosis not present

## 2018-03-03 ENCOUNTER — Encounter: Payer: Self-pay | Admitting: Cardiovascular Disease

## 2018-03-03 ENCOUNTER — Ambulatory Visit (INDEPENDENT_AMBULATORY_CARE_PROVIDER_SITE_OTHER): Payer: Medicare HMO | Admitting: Cardiovascular Disease

## 2018-03-03 DIAGNOSIS — I70229 Atherosclerosis of native arteries of extremities with rest pain, unspecified extremity: Secondary | ICD-10-CM

## 2018-03-03 DIAGNOSIS — E7800 Pure hypercholesterolemia, unspecified: Secondary | ICD-10-CM | POA: Diagnosis not present

## 2018-03-03 DIAGNOSIS — I10 Essential (primary) hypertension: Secondary | ICD-10-CM

## 2018-03-03 DIAGNOSIS — I998 Other disorder of circulatory system: Secondary | ICD-10-CM

## 2018-03-03 NOTE — Progress Notes (Signed)
03/03/2018 Joanne Carlson   December 10, 1945  [id number]  Primary Physician Vernie Shanks, MD Primary Cardiologist: Lorretta Harp MD [name], [place]  HPI:  Joanne Carlson is a 72 y.o.  mother of 2 children referred by Dr. Amalia Hailey for peripheral vascular evaluation because of critical limb ischemia.  Last saw her in the office 11/01/2017.  She has a history of treated hypertension, diabetes and hyperlipidemia. 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 Dr. Adele Barthel placed an AV fistula. She's never had a heart attack or stroke. She denies chest pain or shortness of breath. She's had a small non-healing ulcer on her left great toe treated with antibiotics recently and according to her this is slowly healing. Lower extremity arterial Doppler studies performed in our office 01/03/17 revealed ABIs approximately 0.8 bilaterally with an occluded distal right SFA and high-grade left SFA stenosis with three-vessel runoff.  She had developed a nonhealing wound on her right great toe.  She is currently on hemodialysis.  I angiogram to her 01/30/2018 revealing a 99% calcified mid right SFA stenosis.  I performed chocolate balloon atherectomy followed by drug-eluting balloon angioplasty with excellent result.  Dopplers improved.  Her wound is slowly healing.  She denies claudication on that side.   Current Meds  Medication Sig  . allopurinol (ZYLOPRIM) 100 MG tablet Take 200 mg by mouth daily.   Marland Kitchen aspirin EC 81 MG tablet Take 81 mg by mouth daily.  Marland Kitchen atorvastatin (LIPITOR) 80 MG tablet Take 1 tablet (80 mg total) by mouth daily at 6 PM.  . b complex-vitamin c-folic acid (NEPHRO-VITE) 0.8 MG TABS tablet Take 1 tablet by mouth daily.  . Blood Glucose Monitoring Suppl (ACCU-CHEK AVIVA PLUS) w/Device KIT   . calcium acetate (PHOSLO) 667 MG capsule Take 667-1,334 mg by mouth See admin instructions. Take 2 caps three times a day with meals. Take 1 cap with snacks  twice daily.  . cinacalcet (SENSIPAR) 60 MG tablet Take 60 mg by mouth daily.   . clopidogrel (PLAVIX) 75 MG tablet Take 1 tablet (75 mg total) by mouth daily with breakfast.  . fosinopril (MONOPRIL) 40 MG tablet Take 40 mg by mouth 2 (two) times daily.  Marland Kitchen gabapentin (NEURONTIN) 100 MG capsule Take 1 capsule by mouth every morning and 1 capsule at bedtime. PATIENT NEEDS OFFICE VISIT FOR ADDITIONAL REFILLS (Patient taking differently: Take 100 mg by mouth daily as needed (pain). )  . glucose blood test strip Use to test blood sugar daily. Dx code: 17.02.  Marland Kitchen Lancets MISC Use to test blood sugar daily. Dx code 250.02.  . lidocaine-prilocaine (EMLA) cream Apply 1 application topically 3 (three) times a week. Tuesday, Thursday and Saturday     Allergies  Allergen Reactions  . Tylenol [Acetaminophen] Rash    Social History   Socioeconomic History  . Marital status: Widowed    Spouse name: Not on file  . Number of children: Not on file  . Years of education: Not on file  . Highest education level: Not on file  Occupational History  . Not on file  Social Needs  . Financial resource strain: Not on file  . Food insecurity:    Worry: Not on file    Inability: Not on file  . Transportation needs:    Medical: Not on file    Non-medical: Not on file  Tobacco Use  . Smoking status: Former Smoker    Years:  10.00    Types: Cigarettes    Last attempt to quit: 1970    Years since quitting: 49.6  . Smokeless tobacco: Never Used  . Tobacco comment: "never a heavy smoker; smoked off and on"  Substance and Sexual Activity  . Alcohol use: Not Currently    Comment: "in my younger days"  . Drug use: Not Currently    Types: Marijuana    Comment: "weed in my teens"  . Sexual activity: Not Currently    Birth control/protection: Post-menopausal  Lifestyle  . Physical activity:    Days per week: Not on file    Minutes per session: Not on file  . Stress: Not on file  Relationships  . Social  connections:    Talks on phone: Not on file    Gets together: Not on file    Attends religious service: Not on file    Active member of club or organization: Not on file    Attends meetings of clubs or organizations: Not on file    Relationship status: Not on file  . Intimate partner violence:    Fear of current or ex partner: Not on file    Emotionally abused: Not on file    Physically abused: Not on file    Forced sexual activity: Not on file  Other Topics Concern  . Not on file  Social History Narrative  . Not on file     Review of Systems: General: negative for chills, fever, night sweats or weight changes.  Cardiovascular: negative for chest pain, dyspnea on exertion, edema, orthopnea, palpitations, paroxysmal nocturnal dyspnea or shortness of breath Dermatological: negative for rash Respiratory: negative for cough or wheezing Urologic: negative for hematuria Abdominal: negative for nausea, vomiting, diarrhea, bright red blood per rectum, melena, or hematemesis Neurologic: negative for visual changes, syncope, or dizziness All other systems reviewed and are otherwise negative except as noted above.    Blood pressure 120/60, pulse 68, height _0  (1.626 m), weight 163 lb (73.9 kg).  General appearance: alert and no distress Neck: no adenopathy, no JVD, supple, symmetrical, trachea midline, thyroid not enlarged, symmetric, no tenderness/mass/nodules and Soft bilateral carotid bruits Lungs: clear to auscultation bilaterally Heart: Soft outflow tract murmur Extremities: extremities normal, atraumatic, no cyanosis or edema Pulses: Minutes pedal pulses bilaterally Skin: Healing ischemic ulcer tip of right great toe Neurologic: Alert and oriented X 3, normal strength and tone. Normal symmetric reflexes. Normal coordination and gait  EKG sinus rhythm at 68 with poor R wave progression.  I personally reviewed this EKG.  ASSESSMENT AND PLAN:   Critical lower limb ischemia Ms.  Joanne Carlson returns today for follow-up of her lower extremity angiogram and intervention which I performed 01/30/2018.  She had a nonhealing wound on her right great toe.  She had a 90% calcified mid right SFA which I intervened on, 60% right popliteal artery stenosis, occluded posterior tibial with a 95% peroneal.  I performed Cutting Balloon atherectomy and drug-eluting balloon angioplasty.  Her Dopplers improved.  Her wound apparently is healing and is much less painful.  She continues to follow-up with the wound care center.  HYPERTENSION, BENIGN History of essential hypertension her blood pressure measured at 120/60.  She is on Monopril.  Continue current meds at current dosing  HYPERCHOLESTEROLEMIA History of hyperlipidemia on statin therapy with lipid profile performed 09/12/2017 revealing total cholesterol of 51, LDL 94 and HDL 31.      Lorretta Harp MD [facility], [facility] 03/03/2018 11:52 AM

## 2018-03-03 NOTE — Patient Instructions (Signed)
Medication Instructions:   NO CHANGE  Testing/Procedures:  Your physician has requested that you have a lower extremity arterial duplex. This test is an ultrasound of the arteries in the legs or arms. It looks at arterial blood flow in the legs and arms. Allow one hour for Lower and Upper Arterial scans. There are no restrictions or special instructions SCHEDULE IN 6 MONTHS  Follow-Up:  Your physician wants you to follow-up in: [place] will receive a reminder letter in the mail two months in advance. If you don't receive a letter, please call our office to schedule the follow-up appointment.   If you need a refill on your cardiac medications before your next appointment, please call your pharmacy.

## 2018-03-03 NOTE — Assessment & Plan Note (Signed)
History of essential hypertension her blood pressure measured at 120/60.  She is on Monopril.  Continue current meds at current dosing

## 2018-03-03 NOTE — Assessment & Plan Note (Signed)
Ms. Joanne Carlson returns today for follow-up of her lower extremity angiogram and intervention which I performed 01/30/2018.  She had a nonhealing wound on her right great toe.  She had a 90% calcified mid right SFA which I intervened on, 60% right popliteal artery stenosis, occluded posterior tibial with a 95% peroneal.  I performed Cutting Balloon atherectomy and drug-eluting balloon angioplasty.  Her Dopplers improved.  Her wound apparently is healing and is much less painful.  She continues to follow-up with the wound care center.

## 2018-03-03 NOTE — Assessment & Plan Note (Signed)
History of hyperlipidemia on statin therapy with lipid profile performed 09/12/2017 revealing total cholesterol of 51, LDL 94 and HDL 31.

## 2018-03-04 DIAGNOSIS — D509 Iron deficiency anemia, unspecified: Secondary | ICD-10-CM | POA: Diagnosis not present

## 2018-03-04 DIAGNOSIS — N186 End stage renal disease: Secondary | ICD-10-CM | POA: Diagnosis not present

## 2018-03-04 DIAGNOSIS — Z992 Dependence on renal dialysis: Secondary | ICD-10-CM | POA: Diagnosis not present

## 2018-03-04 DIAGNOSIS — D689 Coagulation defect, unspecified: Secondary | ICD-10-CM | POA: Diagnosis not present

## 2018-03-04 DIAGNOSIS — D631 Anemia in chronic kidney disease: Secondary | ICD-10-CM | POA: Diagnosis not present

## 2018-03-04 DIAGNOSIS — N2581 Secondary hyperparathyroidism of renal origin: Secondary | ICD-10-CM | POA: Diagnosis not present

## 2018-03-04 DIAGNOSIS — E1122 Type 2 diabetes mellitus with diabetic chronic kidney disease: Secondary | ICD-10-CM | POA: Diagnosis not present

## 2018-03-04 DIAGNOSIS — E1129 Type 2 diabetes mellitus with other diabetic kidney complication: Secondary | ICD-10-CM | POA: Diagnosis not present

## 2018-03-07 DIAGNOSIS — D689 Coagulation defect, unspecified: Secondary | ICD-10-CM | POA: Diagnosis not present

## 2018-03-07 DIAGNOSIS — D509 Iron deficiency anemia, unspecified: Secondary | ICD-10-CM | POA: Diagnosis not present

## 2018-03-07 DIAGNOSIS — N186 End stage renal disease: Secondary | ICD-10-CM | POA: Diagnosis not present

## 2018-03-07 DIAGNOSIS — N2581 Secondary hyperparathyroidism of renal origin: Secondary | ICD-10-CM | POA: Diagnosis not present

## 2018-03-07 DIAGNOSIS — D631 Anemia in chronic kidney disease: Secondary | ICD-10-CM | POA: Diagnosis not present

## 2018-03-09 DIAGNOSIS — D689 Coagulation defect, unspecified: Secondary | ICD-10-CM | POA: Diagnosis not present

## 2018-03-09 DIAGNOSIS — N2581 Secondary hyperparathyroidism of renal origin: Secondary | ICD-10-CM | POA: Diagnosis not present

## 2018-03-09 DIAGNOSIS — N186 End stage renal disease: Secondary | ICD-10-CM | POA: Diagnosis not present

## 2018-03-09 DIAGNOSIS — D509 Iron deficiency anemia, unspecified: Secondary | ICD-10-CM | POA: Diagnosis not present

## 2018-03-09 DIAGNOSIS — D631 Anemia in chronic kidney disease: Secondary | ICD-10-CM | POA: Diagnosis not present

## 2018-03-10 ENCOUNTER — Encounter (HOSPITAL_BASED_OUTPATIENT_CLINIC_OR_DEPARTMENT_OTHER): Payer: Medicare HMO | Attending: Internal Medicine

## 2018-03-10 DIAGNOSIS — Z992 Dependence on renal dialysis: Secondary | ICD-10-CM | POA: Diagnosis not present

## 2018-03-10 DIAGNOSIS — M86671 Other chronic osteomyelitis, right ankle and foot: Secondary | ICD-10-CM | POA: Insufficient documentation

## 2018-03-10 DIAGNOSIS — E1151 Type 2 diabetes mellitus with diabetic peripheral angiopathy without gangrene: Secondary | ICD-10-CM | POA: Insufficient documentation

## 2018-03-10 DIAGNOSIS — E11621 Type 2 diabetes mellitus with foot ulcer: Secondary | ICD-10-CM | POA: Insufficient documentation

## 2018-03-10 DIAGNOSIS — E1169 Type 2 diabetes mellitus with other specified complication: Secondary | ICD-10-CM | POA: Diagnosis not present

## 2018-03-10 DIAGNOSIS — E1122 Type 2 diabetes mellitus with diabetic chronic kidney disease: Secondary | ICD-10-CM | POA: Diagnosis not present

## 2018-03-10 DIAGNOSIS — L97512 Non-pressure chronic ulcer of other part of right foot with fat layer exposed: Secondary | ICD-10-CM | POA: Insufficient documentation

## 2018-03-10 DIAGNOSIS — E1140 Type 2 diabetes mellitus with diabetic neuropathy, unspecified: Secondary | ICD-10-CM | POA: Diagnosis not present

## 2018-03-10 DIAGNOSIS — N186 End stage renal disease: Secondary | ICD-10-CM | POA: Insufficient documentation

## 2018-03-10 DIAGNOSIS — I12 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 stage renal disease: Secondary | ICD-10-CM | POA: Insufficient documentation

## 2018-03-11 DIAGNOSIS — N186 End stage renal disease: Secondary | ICD-10-CM | POA: Diagnosis not present

## 2018-03-11 DIAGNOSIS — D509 Iron deficiency anemia, unspecified: Secondary | ICD-10-CM | POA: Diagnosis not present

## 2018-03-11 DIAGNOSIS — D689 Coagulation defect, unspecified: Secondary | ICD-10-CM | POA: Diagnosis not present

## 2018-03-11 DIAGNOSIS — N2581 Secondary hyperparathyroidism of renal origin: Secondary | ICD-10-CM | POA: Diagnosis not present

## 2018-03-11 DIAGNOSIS — D631 Anemia in chronic kidney disease: Secondary | ICD-10-CM | POA: Diagnosis not present

## 2018-03-13 DIAGNOSIS — M109 Gout, unspecified: Secondary | ICD-10-CM | POA: Diagnosis not present

## 2018-03-13 DIAGNOSIS — E669 Obesity, unspecified: Secondary | ICD-10-CM | POA: Diagnosis not present

## 2018-03-13 DIAGNOSIS — E1122 Type 2 diabetes mellitus with diabetic chronic kidney disease: Secondary | ICD-10-CM | POA: Diagnosis not present

## 2018-03-13 DIAGNOSIS — I770 Arteriovenous fistula, acquired: Secondary | ICD-10-CM | POA: Diagnosis not present

## 2018-03-13 DIAGNOSIS — Z992 Dependence on renal dialysis: Secondary | ICD-10-CM | POA: Diagnosis not present

## 2018-03-13 DIAGNOSIS — N2581 Secondary hyperparathyroidism of renal origin: Secondary | ICD-10-CM | POA: Diagnosis not present

## 2018-03-13 DIAGNOSIS — E118 Type 2 diabetes mellitus with unspecified complications: Secondary | ICD-10-CM | POA: Diagnosis not present

## 2018-03-13 DIAGNOSIS — Z905 Acquired absence of kidney: Secondary | ICD-10-CM | POA: Diagnosis not present

## 2018-03-13 DIAGNOSIS — N185 Chronic kidney disease, stage 5: Secondary | ICD-10-CM | POA: Diagnosis not present

## 2018-03-13 DIAGNOSIS — E785 Hyperlipidemia, unspecified: Secondary | ICD-10-CM | POA: Diagnosis not present

## 2018-03-13 DIAGNOSIS — I10 Essential (primary) hypertension: Secondary | ICD-10-CM | POA: Diagnosis not present

## 2018-03-14 DIAGNOSIS — D689 Coagulation defect, unspecified: Secondary | ICD-10-CM | POA: Diagnosis not present

## 2018-03-14 DIAGNOSIS — D631 Anemia in chronic kidney disease: Secondary | ICD-10-CM | POA: Diagnosis not present

## 2018-03-14 DIAGNOSIS — N186 End stage renal disease: Secondary | ICD-10-CM | POA: Diagnosis not present

## 2018-03-14 DIAGNOSIS — N2581 Secondary hyperparathyroidism of renal origin: Secondary | ICD-10-CM | POA: Diagnosis not present

## 2018-03-14 DIAGNOSIS — D509 Iron deficiency anemia, unspecified: Secondary | ICD-10-CM | POA: Diagnosis not present

## 2018-03-16 DIAGNOSIS — D509 Iron deficiency anemia, unspecified: Secondary | ICD-10-CM | POA: Diagnosis not present

## 2018-03-16 DIAGNOSIS — D689 Coagulation defect, unspecified: Secondary | ICD-10-CM | POA: Diagnosis not present

## 2018-03-16 DIAGNOSIS — N186 End stage renal disease: Secondary | ICD-10-CM | POA: Diagnosis not present

## 2018-03-16 DIAGNOSIS — D631 Anemia in chronic kidney disease: Secondary | ICD-10-CM | POA: Diagnosis not present

## 2018-03-16 DIAGNOSIS — N2581 Secondary hyperparathyroidism of renal origin: Secondary | ICD-10-CM | POA: Diagnosis not present

## 2018-03-17 DIAGNOSIS — Z992 Dependence on renal dialysis: Secondary | ICD-10-CM | POA: Diagnosis not present

## 2018-03-17 DIAGNOSIS — Z905 Acquired absence of kidney: Secondary | ICD-10-CM | POA: Diagnosis not present

## 2018-03-17 DIAGNOSIS — I770 Arteriovenous fistula, acquired: Secondary | ICD-10-CM | POA: Diagnosis not present

## 2018-03-17 DIAGNOSIS — I10 Essential (primary) hypertension: Secondary | ICD-10-CM | POA: Diagnosis not present

## 2018-03-17 DIAGNOSIS — E11621 Type 2 diabetes mellitus with foot ulcer: Secondary | ICD-10-CM | POA: Diagnosis not present

## 2018-03-17 DIAGNOSIS — N185 Chronic kidney disease, stage 5: Secondary | ICD-10-CM | POA: Diagnosis not present

## 2018-03-17 DIAGNOSIS — E669 Obesity, unspecified: Secondary | ICD-10-CM | POA: Diagnosis not present

## 2018-03-17 DIAGNOSIS — M86671 Other chronic osteomyelitis, right ankle and foot: Secondary | ICD-10-CM | POA: Diagnosis not present

## 2018-03-17 DIAGNOSIS — E118 Type 2 diabetes mellitus with unspecified complications: Secondary | ICD-10-CM | POA: Diagnosis not present

## 2018-03-17 DIAGNOSIS — N186 End stage renal disease: Secondary | ICD-10-CM | POA: Diagnosis not present

## 2018-03-17 DIAGNOSIS — E1122 Type 2 diabetes mellitus with diabetic chronic kidney disease: Secondary | ICD-10-CM | POA: Diagnosis not present

## 2018-03-17 DIAGNOSIS — L97512 Non-pressure chronic ulcer of other part of right foot with fat layer exposed: Secondary | ICD-10-CM | POA: Diagnosis not present

## 2018-03-17 DIAGNOSIS — M109 Gout, unspecified: Secondary | ICD-10-CM | POA: Diagnosis not present

## 2018-03-17 DIAGNOSIS — I12 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 stage renal disease: Secondary | ICD-10-CM | POA: Diagnosis not present

## 2018-03-17 DIAGNOSIS — E1140 Type 2 diabetes mellitus with diabetic neuropathy, unspecified: Secondary | ICD-10-CM | POA: Diagnosis not present

## 2018-03-17 DIAGNOSIS — E1169 Type 2 diabetes mellitus with other specified complication: Secondary | ICD-10-CM | POA: Diagnosis not present

## 2018-03-17 DIAGNOSIS — N2581 Secondary hyperparathyroidism of renal origin: Secondary | ICD-10-CM | POA: Diagnosis not present

## 2018-03-17 DIAGNOSIS — E785 Hyperlipidemia, unspecified: Secondary | ICD-10-CM | POA: Diagnosis not present

## 2018-03-18 DIAGNOSIS — N186 End stage renal disease: Secondary | ICD-10-CM | POA: Diagnosis not present

## 2018-03-18 DIAGNOSIS — D689 Coagulation defect, unspecified: Secondary | ICD-10-CM | POA: Diagnosis not present

## 2018-03-18 DIAGNOSIS — D631 Anemia in chronic kidney disease: Secondary | ICD-10-CM | POA: Diagnosis not present

## 2018-03-18 DIAGNOSIS — N2581 Secondary hyperparathyroidism of renal origin: Secondary | ICD-10-CM | POA: Diagnosis not present

## 2018-03-18 DIAGNOSIS — D509 Iron deficiency anemia, unspecified: Secondary | ICD-10-CM | POA: Diagnosis not present

## 2018-03-21 DIAGNOSIS — D631 Anemia in chronic kidney disease: Secondary | ICD-10-CM | POA: Diagnosis not present

## 2018-03-21 DIAGNOSIS — D509 Iron deficiency anemia, unspecified: Secondary | ICD-10-CM | POA: Diagnosis not present

## 2018-03-21 DIAGNOSIS — N2581 Secondary hyperparathyroidism of renal origin: Secondary | ICD-10-CM | POA: Diagnosis not present

## 2018-03-21 DIAGNOSIS — N186 End stage renal disease: Secondary | ICD-10-CM | POA: Diagnosis not present

## 2018-03-21 DIAGNOSIS — D689 Coagulation defect, unspecified: Secondary | ICD-10-CM | POA: Diagnosis not present

## 2018-03-23 ENCOUNTER — Telehealth: Payer: Self-pay

## 2018-03-23 DIAGNOSIS — D689 Coagulation defect, unspecified: Secondary | ICD-10-CM | POA: Diagnosis not present

## 2018-03-23 DIAGNOSIS — D509 Iron deficiency anemia, unspecified: Secondary | ICD-10-CM | POA: Diagnosis not present

## 2018-03-23 DIAGNOSIS — N186 End stage renal disease: Secondary | ICD-10-CM | POA: Diagnosis not present

## 2018-03-23 DIAGNOSIS — D631 Anemia in chronic kidney disease: Secondary | ICD-10-CM | POA: Diagnosis not present

## 2018-03-23 DIAGNOSIS — N2581 Secondary hyperparathyroidism of renal origin: Secondary | ICD-10-CM | POA: Diagnosis not present

## 2018-03-23 NOTE — Telephone Encounter (Signed)
   [facility] Pre-operative Risk Assessment    Request for surgical clearance:  1. What type of surgery is being performed? Scaling and Root planning  2. When is this surgery scheduled? TBD   3. What type of clearance is required (medical clearance vs. Pharmacy clearance to hold med vs. Both)? Pharmacy  4. Are there any medications that need to be held prior to surgery and how long? Plavix, How many days before and after procedure    5. Practice name and name of physician performing surgery? [facility], Dr. Otilio Connors    6. What is your office phone number [phone]    7.   What is your office fax number [phone]  8.   Anesthesia type (None, local, MAC, general) ? Local   Jacqulynn Cadet 03/23/2018, 4:11 PM  _________________________________________________________________   (provider comments below) Called the dental office to get the correct spelling of patient's last name and DOB. Also inquired about what type of  procedure, date of the procedure and the type of anesthesia

## 2018-03-25 DIAGNOSIS — D509 Iron deficiency anemia, unspecified: Secondary | ICD-10-CM | POA: Diagnosis not present

## 2018-03-25 DIAGNOSIS — N2581 Secondary hyperparathyroidism of renal origin: Secondary | ICD-10-CM | POA: Diagnosis not present

## 2018-03-25 DIAGNOSIS — D631 Anemia in chronic kidney disease: Secondary | ICD-10-CM | POA: Diagnosis not present

## 2018-03-25 DIAGNOSIS — D689 Coagulation defect, unspecified: Secondary | ICD-10-CM | POA: Diagnosis not present

## 2018-03-25 DIAGNOSIS — N186 End stage renal disease: Secondary | ICD-10-CM | POA: Diagnosis not present

## 2018-03-27 DIAGNOSIS — E1140 Type 2 diabetes mellitus with diabetic neuropathy, unspecified: Secondary | ICD-10-CM | POA: Diagnosis not present

## 2018-03-27 DIAGNOSIS — Z992 Dependence on renal dialysis: Secondary | ICD-10-CM | POA: Diagnosis not present

## 2018-03-27 DIAGNOSIS — E1169 Type 2 diabetes mellitus with other specified complication: Secondary | ICD-10-CM | POA: Diagnosis not present

## 2018-03-27 DIAGNOSIS — I12 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 stage renal disease: Secondary | ICD-10-CM | POA: Diagnosis not present

## 2018-03-27 DIAGNOSIS — N186 End stage renal disease: Secondary | ICD-10-CM | POA: Diagnosis not present

## 2018-03-27 DIAGNOSIS — E1122 Type 2 diabetes mellitus with diabetic chronic kidney disease: Secondary | ICD-10-CM | POA: Diagnosis not present

## 2018-03-27 DIAGNOSIS — E11621 Type 2 diabetes mellitus with foot ulcer: Secondary | ICD-10-CM | POA: Diagnosis not present

## 2018-03-27 DIAGNOSIS — L97512 Non-pressure chronic ulcer of other part of right foot with fat layer exposed: Secondary | ICD-10-CM | POA: Diagnosis not present

## 2018-03-27 DIAGNOSIS — M86671 Other chronic osteomyelitis, right ankle and foot: Secondary | ICD-10-CM | POA: Diagnosis not present

## 2018-03-28 DIAGNOSIS — D689 Coagulation defect, unspecified: Secondary | ICD-10-CM | POA: Diagnosis not present

## 2018-03-28 DIAGNOSIS — D509 Iron deficiency anemia, unspecified: Secondary | ICD-10-CM | POA: Diagnosis not present

## 2018-03-28 DIAGNOSIS — N2581 Secondary hyperparathyroidism of renal origin: Secondary | ICD-10-CM | POA: Diagnosis not present

## 2018-03-28 DIAGNOSIS — N186 End stage renal disease: Secondary | ICD-10-CM | POA: Diagnosis not present

## 2018-03-28 DIAGNOSIS — D631 Anemia in chronic kidney disease: Secondary | ICD-10-CM | POA: Diagnosis not present

## 2018-03-30 DIAGNOSIS — D631 Anemia in chronic kidney disease: Secondary | ICD-10-CM | POA: Diagnosis not present

## 2018-03-30 DIAGNOSIS — N186 End stage renal disease: Secondary | ICD-10-CM | POA: Diagnosis not present

## 2018-03-30 DIAGNOSIS — D689 Coagulation defect, unspecified: Secondary | ICD-10-CM | POA: Diagnosis not present

## 2018-03-30 DIAGNOSIS — N2581 Secondary hyperparathyroidism of renal origin: Secondary | ICD-10-CM | POA: Diagnosis not present

## 2018-03-30 DIAGNOSIS — D509 Iron deficiency anemia, unspecified: Secondary | ICD-10-CM | POA: Diagnosis not present

## 2018-04-01 DIAGNOSIS — N186 End stage renal disease: Secondary | ICD-10-CM | POA: Diagnosis not present

## 2018-04-01 DIAGNOSIS — D509 Iron deficiency anemia, unspecified: Secondary | ICD-10-CM | POA: Diagnosis not present

## 2018-04-01 DIAGNOSIS — D631 Anemia in chronic kidney disease: Secondary | ICD-10-CM | POA: Diagnosis not present

## 2018-04-01 DIAGNOSIS — D689 Coagulation defect, unspecified: Secondary | ICD-10-CM | POA: Diagnosis not present

## 2018-04-01 DIAGNOSIS — N2581 Secondary hyperparathyroidism of renal origin: Secondary | ICD-10-CM | POA: Diagnosis not present

## 2018-04-03 DIAGNOSIS — N186 End stage renal disease: Secondary | ICD-10-CM | POA: Diagnosis not present

## 2018-04-03 DIAGNOSIS — M86671 Other chronic osteomyelitis, right ankle and foot: Secondary | ICD-10-CM | POA: Diagnosis not present

## 2018-04-03 DIAGNOSIS — E1169 Type 2 diabetes mellitus with other specified complication: Secondary | ICD-10-CM | POA: Diagnosis not present

## 2018-04-03 DIAGNOSIS — I12 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 stage renal disease: Secondary | ICD-10-CM | POA: Diagnosis not present

## 2018-04-03 DIAGNOSIS — E11621 Type 2 diabetes mellitus with foot ulcer: Secondary | ICD-10-CM | POA: Diagnosis not present

## 2018-04-03 DIAGNOSIS — E1122 Type 2 diabetes mellitus with diabetic chronic kidney disease: Secondary | ICD-10-CM | POA: Diagnosis not present

## 2018-04-03 DIAGNOSIS — L97512 Non-pressure chronic ulcer of other part of right foot with fat layer exposed: Secondary | ICD-10-CM | POA: Diagnosis not present

## 2018-04-03 DIAGNOSIS — E1140 Type 2 diabetes mellitus with diabetic neuropathy, unspecified: Secondary | ICD-10-CM | POA: Diagnosis not present

## 2018-04-03 DIAGNOSIS — Z992 Dependence on renal dialysis: Secondary | ICD-10-CM | POA: Diagnosis not present

## 2018-04-04 DIAGNOSIS — N2581 Secondary hyperparathyroidism of renal origin: Secondary | ICD-10-CM | POA: Diagnosis not present

## 2018-04-04 DIAGNOSIS — D509 Iron deficiency anemia, unspecified: Secondary | ICD-10-CM | POA: Diagnosis not present

## 2018-04-04 DIAGNOSIS — D631 Anemia in chronic kidney disease: Secondary | ICD-10-CM | POA: Diagnosis not present

## 2018-04-04 DIAGNOSIS — N186 End stage renal disease: Secondary | ICD-10-CM | POA: Diagnosis not present

## 2018-04-04 DIAGNOSIS — D689 Coagulation defect, unspecified: Secondary | ICD-10-CM | POA: Diagnosis not present

## 2018-04-04 DIAGNOSIS — Z23 Encounter for immunization: Secondary | ICD-10-CM | POA: Diagnosis not present

## 2018-04-05 NOTE — Telephone Encounter (Signed)
Probably okay to hold antiplatelet therapy 3 months after her peripheral intervention for any additional surgical procedure.

## 2018-04-05 NOTE — Telephone Encounter (Signed)
   Primary Cardiologist: [name], MD  Chart reviewed as part of pre-operative protocol coverage.   The patient has a hx of PAD s/p R SFA angioplasty on 01/30/18.  As noted by Dr. Gwenlyn Found, she can hold Plavix 3 mos after her PV procedure.  I will send a letter to the dentist outlining these recommendations.  Preop callback staff: Please call dentist to make sure they received my letter. Please make sure the dentist knows that the Plavix cannot be held until after 05/02/18. This note will be removed from the preop pool.   Richardson Dopp, PA-C 04/05/2018, 2:37 PM

## 2018-04-05 NOTE — Telephone Encounter (Signed)
Spoke with [place] @ [facility] to confirm that they received the fax. She confirms as of yet they haven't. She was made aware that the pt's Plavix couldn't be held until after 05/02/18. She thanked me for the call.

## 2018-04-06 DIAGNOSIS — N186 End stage renal disease: Secondary | ICD-10-CM | POA: Diagnosis not present

## 2018-04-06 DIAGNOSIS — D631 Anemia in chronic kidney disease: Secondary | ICD-10-CM | POA: Diagnosis not present

## 2018-04-06 DIAGNOSIS — D509 Iron deficiency anemia, unspecified: Secondary | ICD-10-CM | POA: Diagnosis not present

## 2018-04-06 DIAGNOSIS — Z23 Encounter for immunization: Secondary | ICD-10-CM | POA: Diagnosis not present

## 2018-04-06 DIAGNOSIS — N2581 Secondary hyperparathyroidism of renal origin: Secondary | ICD-10-CM | POA: Diagnosis not present

## 2018-04-06 DIAGNOSIS — D689 Coagulation defect, unspecified: Secondary | ICD-10-CM | POA: Diagnosis not present

## 2018-04-08 DIAGNOSIS — D689 Coagulation defect, unspecified: Secondary | ICD-10-CM | POA: Diagnosis not present

## 2018-04-08 DIAGNOSIS — N2581 Secondary hyperparathyroidism of renal origin: Secondary | ICD-10-CM | POA: Diagnosis not present

## 2018-04-08 DIAGNOSIS — D509 Iron deficiency anemia, unspecified: Secondary | ICD-10-CM | POA: Diagnosis not present

## 2018-04-08 DIAGNOSIS — N186 End stage renal disease: Secondary | ICD-10-CM | POA: Diagnosis not present

## 2018-04-08 DIAGNOSIS — Z23 Encounter for immunization: Secondary | ICD-10-CM | POA: Diagnosis not present

## 2018-04-08 DIAGNOSIS — D631 Anemia in chronic kidney disease: Secondary | ICD-10-CM | POA: Diagnosis not present

## 2018-04-10 ENCOUNTER — Encounter (HOSPITAL_BASED_OUTPATIENT_CLINIC_OR_DEPARTMENT_OTHER): Payer: Medicare HMO | Attending: Internal Medicine

## 2018-04-10 DIAGNOSIS — E1151 Type 2 diabetes mellitus with diabetic peripheral angiopathy without gangrene: Secondary | ICD-10-CM | POA: Insufficient documentation

## 2018-04-10 DIAGNOSIS — L97519 Non-pressure chronic ulcer of other part of right foot with unspecified severity: Secondary | ICD-10-CM | POA: Diagnosis not present

## 2018-04-10 DIAGNOSIS — I12 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 stage renal disease: Secondary | ICD-10-CM | POA: Insufficient documentation

## 2018-04-10 DIAGNOSIS — N186 End stage renal disease: Secondary | ICD-10-CM | POA: Insufficient documentation

## 2018-04-10 DIAGNOSIS — M86671 Other chronic osteomyelitis, right ankle and foot: Secondary | ICD-10-CM | POA: Diagnosis not present

## 2018-04-10 DIAGNOSIS — Z09 Encounter for follow-up examination after completed treatment for conditions other than malignant neoplasm: Secondary | ICD-10-CM | POA: Diagnosis not present

## 2018-04-10 DIAGNOSIS — E1140 Type 2 diabetes mellitus with diabetic neuropathy, unspecified: Secondary | ICD-10-CM | POA: Diagnosis not present

## 2018-04-10 DIAGNOSIS — Z8631 Personal history of diabetic foot ulcer: Secondary | ICD-10-CM | POA: Diagnosis not present

## 2018-04-10 DIAGNOSIS — E1122 Type 2 diabetes mellitus with diabetic chronic kidney disease: Secondary | ICD-10-CM | POA: Insufficient documentation

## 2018-04-11 DIAGNOSIS — N186 End stage renal disease: Secondary | ICD-10-CM | POA: Diagnosis not present

## 2018-04-11 DIAGNOSIS — N2581 Secondary hyperparathyroidism of renal origin: Secondary | ICD-10-CM | POA: Diagnosis not present

## 2018-04-11 DIAGNOSIS — D509 Iron deficiency anemia, unspecified: Secondary | ICD-10-CM | POA: Diagnosis not present

## 2018-04-11 DIAGNOSIS — Z23 Encounter for immunization: Secondary | ICD-10-CM | POA: Diagnosis not present

## 2018-04-11 DIAGNOSIS — D689 Coagulation defect, unspecified: Secondary | ICD-10-CM | POA: Diagnosis not present

## 2018-04-11 DIAGNOSIS — D631 Anemia in chronic kidney disease: Secondary | ICD-10-CM | POA: Diagnosis not present

## 2018-04-12 ENCOUNTER — Telehealth: Payer: Self-pay | Admitting: Cardiovascular Disease

## 2018-04-12 NOTE — Telephone Encounter (Signed)
Patient reports having diarrhea for the past month. She states she thinks it is due to the medication, Plavix, that she was started on in the hospital at the end of July. Consulted with the pharmacist, Erasmo Downer, about this and she states that this is a highly unlikely side effect. Advised patient she should see her PCP to rule out any other explanation for the diarrhea before making medication changes. Patient verbalized understanding. No further questions.

## 2018-04-12 NOTE — Telephone Encounter (Signed)
New Message         Patient called today stating that she is urinating quiet a bit and wanted to know if the "Atorvastatin/Clopidgorel" has anything to do with this? Pls call and advise.

## 2018-04-13 DIAGNOSIS — N186 End stage renal disease: Secondary | ICD-10-CM | POA: Diagnosis not present

## 2018-04-13 DIAGNOSIS — D631 Anemia in chronic kidney disease: Secondary | ICD-10-CM | POA: Diagnosis not present

## 2018-04-13 DIAGNOSIS — D689 Coagulation defect, unspecified: Secondary | ICD-10-CM | POA: Diagnosis not present

## 2018-04-13 DIAGNOSIS — D509 Iron deficiency anemia, unspecified: Secondary | ICD-10-CM | POA: Diagnosis not present

## 2018-04-13 DIAGNOSIS — N2581 Secondary hyperparathyroidism of renal origin: Secondary | ICD-10-CM | POA: Diagnosis not present

## 2018-04-13 DIAGNOSIS — Z23 Encounter for immunization: Secondary | ICD-10-CM | POA: Diagnosis not present

## 2018-04-15 DIAGNOSIS — D631 Anemia in chronic kidney disease: Secondary | ICD-10-CM | POA: Diagnosis not present

## 2018-04-15 DIAGNOSIS — D509 Iron deficiency anemia, unspecified: Secondary | ICD-10-CM | POA: Diagnosis not present

## 2018-04-15 DIAGNOSIS — N186 End stage renal disease: Secondary | ICD-10-CM | POA: Diagnosis not present

## 2018-04-15 DIAGNOSIS — Z23 Encounter for immunization: Secondary | ICD-10-CM | POA: Diagnosis not present

## 2018-04-15 DIAGNOSIS — N2581 Secondary hyperparathyroidism of renal origin: Secondary | ICD-10-CM | POA: Diagnosis not present

## 2018-04-15 DIAGNOSIS — D689 Coagulation defect, unspecified: Secondary | ICD-10-CM | POA: Diagnosis not present

## 2018-04-18 DIAGNOSIS — Z23 Encounter for immunization: Secondary | ICD-10-CM | POA: Diagnosis not present

## 2018-04-18 DIAGNOSIS — N2581 Secondary hyperparathyroidism of renal origin: Secondary | ICD-10-CM | POA: Diagnosis not present

## 2018-04-18 DIAGNOSIS — D509 Iron deficiency anemia, unspecified: Secondary | ICD-10-CM | POA: Diagnosis not present

## 2018-04-18 DIAGNOSIS — D689 Coagulation defect, unspecified: Secondary | ICD-10-CM | POA: Diagnosis not present

## 2018-04-18 DIAGNOSIS — D631 Anemia in chronic kidney disease: Secondary | ICD-10-CM | POA: Diagnosis not present

## 2018-04-18 DIAGNOSIS — N186 End stage renal disease: Secondary | ICD-10-CM | POA: Diagnosis not present

## 2018-04-20 DIAGNOSIS — D689 Coagulation defect, unspecified: Secondary | ICD-10-CM | POA: Diagnosis not present

## 2018-04-20 DIAGNOSIS — D509 Iron deficiency anemia, unspecified: Secondary | ICD-10-CM | POA: Diagnosis not present

## 2018-04-20 DIAGNOSIS — Z23 Encounter for immunization: Secondary | ICD-10-CM | POA: Diagnosis not present

## 2018-04-20 DIAGNOSIS — N186 End stage renal disease: Secondary | ICD-10-CM | POA: Diagnosis not present

## 2018-04-20 DIAGNOSIS — D631 Anemia in chronic kidney disease: Secondary | ICD-10-CM | POA: Diagnosis not present

## 2018-04-20 DIAGNOSIS — N2581 Secondary hyperparathyroidism of renal origin: Secondary | ICD-10-CM | POA: Diagnosis not present

## 2018-04-22 DIAGNOSIS — D689 Coagulation defect, unspecified: Secondary | ICD-10-CM | POA: Diagnosis not present

## 2018-04-22 DIAGNOSIS — N186 End stage renal disease: Secondary | ICD-10-CM | POA: Diagnosis not present

## 2018-04-22 DIAGNOSIS — N2581 Secondary hyperparathyroidism of renal origin: Secondary | ICD-10-CM | POA: Diagnosis not present

## 2018-04-22 DIAGNOSIS — D509 Iron deficiency anemia, unspecified: Secondary | ICD-10-CM | POA: Diagnosis not present

## 2018-04-22 DIAGNOSIS — D631 Anemia in chronic kidney disease: Secondary | ICD-10-CM | POA: Diagnosis not present

## 2018-04-22 DIAGNOSIS — Z23 Encounter for immunization: Secondary | ICD-10-CM | POA: Diagnosis not present

## 2018-04-25 DIAGNOSIS — D689 Coagulation defect, unspecified: Secondary | ICD-10-CM | POA: Diagnosis not present

## 2018-04-25 DIAGNOSIS — D509 Iron deficiency anemia, unspecified: Secondary | ICD-10-CM | POA: Diagnosis not present

## 2018-04-25 DIAGNOSIS — Z23 Encounter for immunization: Secondary | ICD-10-CM | POA: Diagnosis not present

## 2018-04-25 DIAGNOSIS — N186 End stage renal disease: Secondary | ICD-10-CM | POA: Diagnosis not present

## 2018-04-25 DIAGNOSIS — D631 Anemia in chronic kidney disease: Secondary | ICD-10-CM | POA: Diagnosis not present

## 2018-04-25 DIAGNOSIS — N2581 Secondary hyperparathyroidism of renal origin: Secondary | ICD-10-CM | POA: Diagnosis not present

## 2018-04-27 DIAGNOSIS — D631 Anemia in chronic kidney disease: Secondary | ICD-10-CM | POA: Diagnosis not present

## 2018-04-27 DIAGNOSIS — D689 Coagulation defect, unspecified: Secondary | ICD-10-CM | POA: Diagnosis not present

## 2018-04-27 DIAGNOSIS — Z23 Encounter for immunization: Secondary | ICD-10-CM | POA: Diagnosis not present

## 2018-04-27 DIAGNOSIS — N186 End stage renal disease: Secondary | ICD-10-CM | POA: Diagnosis not present

## 2018-04-27 DIAGNOSIS — N2581 Secondary hyperparathyroidism of renal origin: Secondary | ICD-10-CM | POA: Diagnosis not present

## 2018-04-27 DIAGNOSIS — D509 Iron deficiency anemia, unspecified: Secondary | ICD-10-CM | POA: Diagnosis not present

## 2018-04-29 DIAGNOSIS — D631 Anemia in chronic kidney disease: Secondary | ICD-10-CM | POA: Diagnosis not present

## 2018-04-29 DIAGNOSIS — D509 Iron deficiency anemia, unspecified: Secondary | ICD-10-CM | POA: Diagnosis not present

## 2018-04-29 DIAGNOSIS — D689 Coagulation defect, unspecified: Secondary | ICD-10-CM | POA: Diagnosis not present

## 2018-04-29 DIAGNOSIS — Z23 Encounter for immunization: Secondary | ICD-10-CM | POA: Diagnosis not present

## 2018-04-29 DIAGNOSIS — N2581 Secondary hyperparathyroidism of renal origin: Secondary | ICD-10-CM | POA: Diagnosis not present

## 2018-04-29 DIAGNOSIS — N186 End stage renal disease: Secondary | ICD-10-CM | POA: Diagnosis not present

## 2018-05-02 DIAGNOSIS — D509 Iron deficiency anemia, unspecified: Secondary | ICD-10-CM | POA: Diagnosis not present

## 2018-05-02 DIAGNOSIS — Z23 Encounter for immunization: Secondary | ICD-10-CM | POA: Diagnosis not present

## 2018-05-02 DIAGNOSIS — N186 End stage renal disease: Secondary | ICD-10-CM | POA: Diagnosis not present

## 2018-05-02 DIAGNOSIS — N2581 Secondary hyperparathyroidism of renal origin: Secondary | ICD-10-CM | POA: Diagnosis not present

## 2018-05-02 DIAGNOSIS — D689 Coagulation defect, unspecified: Secondary | ICD-10-CM | POA: Diagnosis not present

## 2018-05-02 DIAGNOSIS — D631 Anemia in chronic kidney disease: Secondary | ICD-10-CM | POA: Diagnosis not present

## 2018-05-04 DIAGNOSIS — Z23 Encounter for immunization: Secondary | ICD-10-CM | POA: Diagnosis not present

## 2018-05-04 DIAGNOSIS — D689 Coagulation defect, unspecified: Secondary | ICD-10-CM | POA: Diagnosis not present

## 2018-05-04 DIAGNOSIS — N186 End stage renal disease: Secondary | ICD-10-CM | POA: Diagnosis not present

## 2018-05-04 DIAGNOSIS — E1122 Type 2 diabetes mellitus with diabetic chronic kidney disease: Secondary | ICD-10-CM | POA: Diagnosis not present

## 2018-05-04 DIAGNOSIS — D631 Anemia in chronic kidney disease: Secondary | ICD-10-CM | POA: Diagnosis not present

## 2018-05-04 DIAGNOSIS — Z992 Dependence on renal dialysis: Secondary | ICD-10-CM | POA: Diagnosis not present

## 2018-05-04 DIAGNOSIS — N2581 Secondary hyperparathyroidism of renal origin: Secondary | ICD-10-CM | POA: Diagnosis not present

## 2018-05-04 DIAGNOSIS — D509 Iron deficiency anemia, unspecified: Secondary | ICD-10-CM | POA: Diagnosis not present

## 2018-05-06 DIAGNOSIS — D689 Coagulation defect, unspecified: Secondary | ICD-10-CM | POA: Diagnosis not present

## 2018-05-06 DIAGNOSIS — D509 Iron deficiency anemia, unspecified: Secondary | ICD-10-CM | POA: Diagnosis not present

## 2018-05-06 DIAGNOSIS — D631 Anemia in chronic kidney disease: Secondary | ICD-10-CM | POA: Diagnosis not present

## 2018-05-06 DIAGNOSIS — N186 End stage renal disease: Secondary | ICD-10-CM | POA: Diagnosis not present

## 2018-05-06 DIAGNOSIS — N2581 Secondary hyperparathyroidism of renal origin: Secondary | ICD-10-CM | POA: Diagnosis not present

## 2018-05-09 DIAGNOSIS — D631 Anemia in chronic kidney disease: Secondary | ICD-10-CM | POA: Diagnosis not present

## 2018-05-09 DIAGNOSIS — D689 Coagulation defect, unspecified: Secondary | ICD-10-CM | POA: Diagnosis not present

## 2018-05-09 DIAGNOSIS — N2581 Secondary hyperparathyroidism of renal origin: Secondary | ICD-10-CM | POA: Diagnosis not present

## 2018-05-09 DIAGNOSIS — N186 End stage renal disease: Secondary | ICD-10-CM | POA: Diagnosis not present

## 2018-05-09 DIAGNOSIS — D509 Iron deficiency anemia, unspecified: Secondary | ICD-10-CM | POA: Diagnosis not present

## 2018-05-11 DIAGNOSIS — D509 Iron deficiency anemia, unspecified: Secondary | ICD-10-CM | POA: Diagnosis not present

## 2018-05-11 DIAGNOSIS — D689 Coagulation defect, unspecified: Secondary | ICD-10-CM | POA: Diagnosis not present

## 2018-05-11 DIAGNOSIS — N186 End stage renal disease: Secondary | ICD-10-CM | POA: Diagnosis not present

## 2018-05-11 DIAGNOSIS — D631 Anemia in chronic kidney disease: Secondary | ICD-10-CM | POA: Diagnosis not present

## 2018-05-11 DIAGNOSIS — N2581 Secondary hyperparathyroidism of renal origin: Secondary | ICD-10-CM | POA: Diagnosis not present

## 2018-05-13 DIAGNOSIS — D631 Anemia in chronic kidney disease: Secondary | ICD-10-CM | POA: Diagnosis not present

## 2018-05-13 DIAGNOSIS — D509 Iron deficiency anemia, unspecified: Secondary | ICD-10-CM | POA: Diagnosis not present

## 2018-05-13 DIAGNOSIS — N186 End stage renal disease: Secondary | ICD-10-CM | POA: Diagnosis not present

## 2018-05-13 DIAGNOSIS — N2581 Secondary hyperparathyroidism of renal origin: Secondary | ICD-10-CM | POA: Diagnosis not present

## 2018-05-13 DIAGNOSIS — D689 Coagulation defect, unspecified: Secondary | ICD-10-CM | POA: Diagnosis not present

## 2018-05-16 DIAGNOSIS — D689 Coagulation defect, unspecified: Secondary | ICD-10-CM | POA: Diagnosis not present

## 2018-05-16 DIAGNOSIS — D631 Anemia in chronic kidney disease: Secondary | ICD-10-CM | POA: Diagnosis not present

## 2018-05-16 DIAGNOSIS — N2581 Secondary hyperparathyroidism of renal origin: Secondary | ICD-10-CM | POA: Diagnosis not present

## 2018-05-16 DIAGNOSIS — D509 Iron deficiency anemia, unspecified: Secondary | ICD-10-CM | POA: Diagnosis not present

## 2018-05-16 DIAGNOSIS — N186 End stage renal disease: Secondary | ICD-10-CM | POA: Diagnosis not present

## 2018-05-18 DIAGNOSIS — D631 Anemia in chronic kidney disease: Secondary | ICD-10-CM | POA: Diagnosis not present

## 2018-05-18 DIAGNOSIS — N186 End stage renal disease: Secondary | ICD-10-CM | POA: Diagnosis not present

## 2018-05-18 DIAGNOSIS — N2581 Secondary hyperparathyroidism of renal origin: Secondary | ICD-10-CM | POA: Diagnosis not present

## 2018-05-18 DIAGNOSIS — D689 Coagulation defect, unspecified: Secondary | ICD-10-CM | POA: Diagnosis not present

## 2018-05-18 DIAGNOSIS — D509 Iron deficiency anemia, unspecified: Secondary | ICD-10-CM | POA: Diagnosis not present

## 2018-05-20 DIAGNOSIS — D509 Iron deficiency anemia, unspecified: Secondary | ICD-10-CM | POA: Diagnosis not present

## 2018-05-20 DIAGNOSIS — N2581 Secondary hyperparathyroidism of renal origin: Secondary | ICD-10-CM | POA: Diagnosis not present

## 2018-05-20 DIAGNOSIS — D631 Anemia in chronic kidney disease: Secondary | ICD-10-CM | POA: Diagnosis not present

## 2018-05-20 DIAGNOSIS — D689 Coagulation defect, unspecified: Secondary | ICD-10-CM | POA: Diagnosis not present

## 2018-05-20 DIAGNOSIS — N186 End stage renal disease: Secondary | ICD-10-CM | POA: Diagnosis not present

## 2018-05-23 DIAGNOSIS — D689 Coagulation defect, unspecified: Secondary | ICD-10-CM | POA: Diagnosis not present

## 2018-05-23 DIAGNOSIS — N186 End stage renal disease: Secondary | ICD-10-CM | POA: Diagnosis not present

## 2018-05-23 DIAGNOSIS — N2581 Secondary hyperparathyroidism of renal origin: Secondary | ICD-10-CM | POA: Diagnosis not present

## 2018-05-23 DIAGNOSIS — D509 Iron deficiency anemia, unspecified: Secondary | ICD-10-CM | POA: Diagnosis not present

## 2018-05-23 DIAGNOSIS — D631 Anemia in chronic kidney disease: Secondary | ICD-10-CM | POA: Diagnosis not present

## 2018-05-25 DIAGNOSIS — D631 Anemia in chronic kidney disease: Secondary | ICD-10-CM | POA: Diagnosis not present

## 2018-05-25 DIAGNOSIS — N186 End stage renal disease: Secondary | ICD-10-CM | POA: Diagnosis not present

## 2018-05-25 DIAGNOSIS — D689 Coagulation defect, unspecified: Secondary | ICD-10-CM | POA: Diagnosis not present

## 2018-05-25 DIAGNOSIS — N2581 Secondary hyperparathyroidism of renal origin: Secondary | ICD-10-CM | POA: Diagnosis not present

## 2018-05-25 DIAGNOSIS — D509 Iron deficiency anemia, unspecified: Secondary | ICD-10-CM | POA: Diagnosis not present

## 2018-05-27 DIAGNOSIS — N2581 Secondary hyperparathyroidism of renal origin: Secondary | ICD-10-CM | POA: Diagnosis not present

## 2018-05-27 DIAGNOSIS — N186 End stage renal disease: Secondary | ICD-10-CM | POA: Diagnosis not present

## 2018-05-27 DIAGNOSIS — D509 Iron deficiency anemia, unspecified: Secondary | ICD-10-CM | POA: Diagnosis not present

## 2018-05-27 DIAGNOSIS — D631 Anemia in chronic kidney disease: Secondary | ICD-10-CM | POA: Diagnosis not present

## 2018-05-27 DIAGNOSIS — D689 Coagulation defect, unspecified: Secondary | ICD-10-CM | POA: Diagnosis not present

## 2018-05-29 DIAGNOSIS — N186 End stage renal disease: Secondary | ICD-10-CM | POA: Diagnosis not present

## 2018-05-29 DIAGNOSIS — D509 Iron deficiency anemia, unspecified: Secondary | ICD-10-CM | POA: Diagnosis not present

## 2018-05-29 DIAGNOSIS — D689 Coagulation defect, unspecified: Secondary | ICD-10-CM | POA: Diagnosis not present

## 2018-05-29 DIAGNOSIS — D631 Anemia in chronic kidney disease: Secondary | ICD-10-CM | POA: Diagnosis not present

## 2018-05-29 DIAGNOSIS — N2581 Secondary hyperparathyroidism of renal origin: Secondary | ICD-10-CM | POA: Diagnosis not present

## 2018-05-30 ENCOUNTER — Other Ambulatory Visit: Payer: Self-pay | Admitting: Cardiology

## 2018-05-30 ENCOUNTER — Other Ambulatory Visit: Payer: Self-pay | Admitting: Family Medicine

## 2018-05-30 DIAGNOSIS — Z1231 Encounter for screening mammogram for malignant neoplasm of breast: Secondary | ICD-10-CM

## 2018-05-31 DIAGNOSIS — D631 Anemia in chronic kidney disease: Secondary | ICD-10-CM | POA: Diagnosis not present

## 2018-05-31 DIAGNOSIS — N2581 Secondary hyperparathyroidism of renal origin: Secondary | ICD-10-CM | POA: Diagnosis not present

## 2018-05-31 DIAGNOSIS — D509 Iron deficiency anemia, unspecified: Secondary | ICD-10-CM | POA: Diagnosis not present

## 2018-05-31 DIAGNOSIS — N186 End stage renal disease: Secondary | ICD-10-CM | POA: Diagnosis not present

## 2018-05-31 DIAGNOSIS — D689 Coagulation defect, unspecified: Secondary | ICD-10-CM | POA: Diagnosis not present

## 2018-06-02 DIAGNOSIS — D689 Coagulation defect, unspecified: Secondary | ICD-10-CM | POA: Diagnosis not present

## 2018-06-02 DIAGNOSIS — D631 Anemia in chronic kidney disease: Secondary | ICD-10-CM | POA: Diagnosis not present

## 2018-06-02 DIAGNOSIS — N186 End stage renal disease: Secondary | ICD-10-CM | POA: Diagnosis not present

## 2018-06-02 DIAGNOSIS — D509 Iron deficiency anemia, unspecified: Secondary | ICD-10-CM | POA: Diagnosis not present

## 2018-06-02 DIAGNOSIS — N2581 Secondary hyperparathyroidism of renal origin: Secondary | ICD-10-CM | POA: Diagnosis not present

## 2018-06-03 DIAGNOSIS — N186 End stage renal disease: Secondary | ICD-10-CM | POA: Diagnosis not present

## 2018-06-03 DIAGNOSIS — Z992 Dependence on renal dialysis: Secondary | ICD-10-CM | POA: Diagnosis not present

## 2018-06-03 DIAGNOSIS — E1122 Type 2 diabetes mellitus with diabetic chronic kidney disease: Secondary | ICD-10-CM | POA: Diagnosis not present

## 2018-06-06 DIAGNOSIS — D689 Coagulation defect, unspecified: Secondary | ICD-10-CM | POA: Diagnosis not present

## 2018-06-06 DIAGNOSIS — D509 Iron deficiency anemia, unspecified: Secondary | ICD-10-CM | POA: Diagnosis not present

## 2018-06-06 DIAGNOSIS — N2581 Secondary hyperparathyroidism of renal origin: Secondary | ICD-10-CM | POA: Diagnosis not present

## 2018-06-06 DIAGNOSIS — N186 End stage renal disease: Secondary | ICD-10-CM | POA: Diagnosis not present

## 2018-06-06 DIAGNOSIS — E0842 Diabetes mellitus due to underlying condition with diabetic polyneuropathy: Secondary | ICD-10-CM | POA: Diagnosis not present

## 2018-06-06 DIAGNOSIS — D631 Anemia in chronic kidney disease: Secondary | ICD-10-CM | POA: Diagnosis not present

## 2018-06-08 DIAGNOSIS — E0842 Diabetes mellitus due to underlying condition with diabetic polyneuropathy: Secondary | ICD-10-CM | POA: Diagnosis not present

## 2018-06-08 DIAGNOSIS — N186 End stage renal disease: Secondary | ICD-10-CM | POA: Diagnosis not present

## 2018-06-08 DIAGNOSIS — D689 Coagulation defect, unspecified: Secondary | ICD-10-CM | POA: Diagnosis not present

## 2018-06-08 DIAGNOSIS — N2581 Secondary hyperparathyroidism of renal origin: Secondary | ICD-10-CM | POA: Diagnosis not present

## 2018-06-08 DIAGNOSIS — D509 Iron deficiency anemia, unspecified: Secondary | ICD-10-CM | POA: Diagnosis not present

## 2018-06-08 DIAGNOSIS — D631 Anemia in chronic kidney disease: Secondary | ICD-10-CM | POA: Diagnosis not present

## 2018-06-10 DIAGNOSIS — D631 Anemia in chronic kidney disease: Secondary | ICD-10-CM | POA: Diagnosis not present

## 2018-06-10 DIAGNOSIS — D509 Iron deficiency anemia, unspecified: Secondary | ICD-10-CM | POA: Diagnosis not present

## 2018-06-10 DIAGNOSIS — N2581 Secondary hyperparathyroidism of renal origin: Secondary | ICD-10-CM | POA: Diagnosis not present

## 2018-06-10 DIAGNOSIS — N186 End stage renal disease: Secondary | ICD-10-CM | POA: Diagnosis not present

## 2018-06-10 DIAGNOSIS — D689 Coagulation defect, unspecified: Secondary | ICD-10-CM | POA: Diagnosis not present

## 2018-06-10 DIAGNOSIS — E0842 Diabetes mellitus due to underlying condition with diabetic polyneuropathy: Secondary | ICD-10-CM | POA: Diagnosis not present

## 2018-06-13 DIAGNOSIS — D631 Anemia in chronic kidney disease: Secondary | ICD-10-CM | POA: Diagnosis not present

## 2018-06-13 DIAGNOSIS — E0842 Diabetes mellitus due to underlying condition with diabetic polyneuropathy: Secondary | ICD-10-CM | POA: Diagnosis not present

## 2018-06-13 DIAGNOSIS — N186 End stage renal disease: Secondary | ICD-10-CM | POA: Diagnosis not present

## 2018-06-13 DIAGNOSIS — D689 Coagulation defect, unspecified: Secondary | ICD-10-CM | POA: Diagnosis not present

## 2018-06-13 DIAGNOSIS — N2581 Secondary hyperparathyroidism of renal origin: Secondary | ICD-10-CM | POA: Diagnosis not present

## 2018-06-13 DIAGNOSIS — D509 Iron deficiency anemia, unspecified: Secondary | ICD-10-CM | POA: Diagnosis not present

## 2018-06-15 DIAGNOSIS — D631 Anemia in chronic kidney disease: Secondary | ICD-10-CM | POA: Diagnosis not present

## 2018-06-15 DIAGNOSIS — D689 Coagulation defect, unspecified: Secondary | ICD-10-CM | POA: Diagnosis not present

## 2018-06-15 DIAGNOSIS — E0842 Diabetes mellitus due to underlying condition with diabetic polyneuropathy: Secondary | ICD-10-CM | POA: Diagnosis not present

## 2018-06-15 DIAGNOSIS — D509 Iron deficiency anemia, unspecified: Secondary | ICD-10-CM | POA: Diagnosis not present

## 2018-06-15 DIAGNOSIS — N186 End stage renal disease: Secondary | ICD-10-CM | POA: Diagnosis not present

## 2018-06-15 DIAGNOSIS — N2581 Secondary hyperparathyroidism of renal origin: Secondary | ICD-10-CM | POA: Diagnosis not present

## 2018-06-17 DIAGNOSIS — D689 Coagulation defect, unspecified: Secondary | ICD-10-CM | POA: Diagnosis not present

## 2018-06-17 DIAGNOSIS — D509 Iron deficiency anemia, unspecified: Secondary | ICD-10-CM | POA: Diagnosis not present

## 2018-06-17 DIAGNOSIS — E0842 Diabetes mellitus due to underlying condition with diabetic polyneuropathy: Secondary | ICD-10-CM | POA: Diagnosis not present

## 2018-06-17 DIAGNOSIS — N186 End stage renal disease: Secondary | ICD-10-CM | POA: Diagnosis not present

## 2018-06-17 DIAGNOSIS — D631 Anemia in chronic kidney disease: Secondary | ICD-10-CM | POA: Diagnosis not present

## 2018-06-17 DIAGNOSIS — N2581 Secondary hyperparathyroidism of renal origin: Secondary | ICD-10-CM | POA: Diagnosis not present

## 2018-06-20 DIAGNOSIS — N2581 Secondary hyperparathyroidism of renal origin: Secondary | ICD-10-CM | POA: Diagnosis not present

## 2018-06-20 DIAGNOSIS — D689 Coagulation defect, unspecified: Secondary | ICD-10-CM | POA: Diagnosis not present

## 2018-06-20 DIAGNOSIS — D631 Anemia in chronic kidney disease: Secondary | ICD-10-CM | POA: Diagnosis not present

## 2018-06-20 DIAGNOSIS — D509 Iron deficiency anemia, unspecified: Secondary | ICD-10-CM | POA: Diagnosis not present

## 2018-06-20 DIAGNOSIS — E0842 Diabetes mellitus due to underlying condition with diabetic polyneuropathy: Secondary | ICD-10-CM | POA: Diagnosis not present

## 2018-06-20 DIAGNOSIS — N186 End stage renal disease: Secondary | ICD-10-CM | POA: Diagnosis not present

## 2018-06-22 DIAGNOSIS — D631 Anemia in chronic kidney disease: Secondary | ICD-10-CM | POA: Diagnosis not present

## 2018-06-22 DIAGNOSIS — D509 Iron deficiency anemia, unspecified: Secondary | ICD-10-CM | POA: Diagnosis not present

## 2018-06-22 DIAGNOSIS — E0842 Diabetes mellitus due to underlying condition with diabetic polyneuropathy: Secondary | ICD-10-CM | POA: Diagnosis not present

## 2018-06-22 DIAGNOSIS — N186 End stage renal disease: Secondary | ICD-10-CM | POA: Diagnosis not present

## 2018-06-22 DIAGNOSIS — D689 Coagulation defect, unspecified: Secondary | ICD-10-CM | POA: Diagnosis not present

## 2018-06-22 DIAGNOSIS — N2581 Secondary hyperparathyroidism of renal origin: Secondary | ICD-10-CM | POA: Diagnosis not present

## 2018-06-24 DIAGNOSIS — N186 End stage renal disease: Secondary | ICD-10-CM | POA: Diagnosis not present

## 2018-06-24 DIAGNOSIS — D631 Anemia in chronic kidney disease: Secondary | ICD-10-CM | POA: Diagnosis not present

## 2018-06-24 DIAGNOSIS — N2581 Secondary hyperparathyroidism of renal origin: Secondary | ICD-10-CM | POA: Diagnosis not present

## 2018-06-24 DIAGNOSIS — E0842 Diabetes mellitus due to underlying condition with diabetic polyneuropathy: Secondary | ICD-10-CM | POA: Diagnosis not present

## 2018-06-24 DIAGNOSIS — D689 Coagulation defect, unspecified: Secondary | ICD-10-CM | POA: Diagnosis not present

## 2018-06-24 DIAGNOSIS — D509 Iron deficiency anemia, unspecified: Secondary | ICD-10-CM | POA: Diagnosis not present

## 2018-06-26 DIAGNOSIS — D631 Anemia in chronic kidney disease: Secondary | ICD-10-CM | POA: Diagnosis not present

## 2018-06-26 DIAGNOSIS — D689 Coagulation defect, unspecified: Secondary | ICD-10-CM | POA: Diagnosis not present

## 2018-06-26 DIAGNOSIS — N186 End stage renal disease: Secondary | ICD-10-CM | POA: Diagnosis not present

## 2018-06-26 DIAGNOSIS — E0842 Diabetes mellitus due to underlying condition with diabetic polyneuropathy: Secondary | ICD-10-CM | POA: Diagnosis not present

## 2018-06-26 DIAGNOSIS — D509 Iron deficiency anemia, unspecified: Secondary | ICD-10-CM | POA: Diagnosis not present

## 2018-06-26 DIAGNOSIS — N2581 Secondary hyperparathyroidism of renal origin: Secondary | ICD-10-CM | POA: Diagnosis not present

## 2018-06-29 DIAGNOSIS — D689 Coagulation defect, unspecified: Secondary | ICD-10-CM | POA: Diagnosis not present

## 2018-06-29 DIAGNOSIS — N186 End stage renal disease: Secondary | ICD-10-CM | POA: Diagnosis not present

## 2018-06-29 DIAGNOSIS — E0842 Diabetes mellitus due to underlying condition with diabetic polyneuropathy: Secondary | ICD-10-CM | POA: Diagnosis not present

## 2018-06-29 DIAGNOSIS — D509 Iron deficiency anemia, unspecified: Secondary | ICD-10-CM | POA: Diagnosis not present

## 2018-06-29 DIAGNOSIS — D631 Anemia in chronic kidney disease: Secondary | ICD-10-CM | POA: Diagnosis not present

## 2018-06-29 DIAGNOSIS — N2581 Secondary hyperparathyroidism of renal origin: Secondary | ICD-10-CM | POA: Diagnosis not present

## 2018-07-01 DIAGNOSIS — N2581 Secondary hyperparathyroidism of renal origin: Secondary | ICD-10-CM | POA: Diagnosis not present

## 2018-07-01 DIAGNOSIS — D689 Coagulation defect, unspecified: Secondary | ICD-10-CM | POA: Diagnosis not present

## 2018-07-01 DIAGNOSIS — N186 End stage renal disease: Secondary | ICD-10-CM | POA: Diagnosis not present

## 2018-07-01 DIAGNOSIS — E0842 Diabetes mellitus due to underlying condition with diabetic polyneuropathy: Secondary | ICD-10-CM | POA: Diagnosis not present

## 2018-07-01 DIAGNOSIS — D509 Iron deficiency anemia, unspecified: Secondary | ICD-10-CM | POA: Diagnosis not present

## 2018-07-01 DIAGNOSIS — D631 Anemia in chronic kidney disease: Secondary | ICD-10-CM | POA: Diagnosis not present

## 2018-07-03 DIAGNOSIS — D631 Anemia in chronic kidney disease: Secondary | ICD-10-CM | POA: Diagnosis not present

## 2018-07-03 DIAGNOSIS — D689 Coagulation defect, unspecified: Secondary | ICD-10-CM | POA: Diagnosis not present

## 2018-07-03 DIAGNOSIS — D509 Iron deficiency anemia, unspecified: Secondary | ICD-10-CM | POA: Diagnosis not present

## 2018-07-03 DIAGNOSIS — N2581 Secondary hyperparathyroidism of renal origin: Secondary | ICD-10-CM | POA: Diagnosis not present

## 2018-07-03 DIAGNOSIS — E0842 Diabetes mellitus due to underlying condition with diabetic polyneuropathy: Secondary | ICD-10-CM | POA: Diagnosis not present

## 2018-07-03 DIAGNOSIS — N186 End stage renal disease: Secondary | ICD-10-CM | POA: Diagnosis not present

## 2018-07-04 DIAGNOSIS — N186 End stage renal disease: Secondary | ICD-10-CM | POA: Diagnosis not present

## 2018-07-04 DIAGNOSIS — Z992 Dependence on renal dialysis: Secondary | ICD-10-CM | POA: Diagnosis not present

## 2018-07-04 DIAGNOSIS — E1122 Type 2 diabetes mellitus with diabetic chronic kidney disease: Secondary | ICD-10-CM | POA: Diagnosis not present

## 2018-07-06 DIAGNOSIS — N186 End stage renal disease: Secondary | ICD-10-CM | POA: Diagnosis not present

## 2018-07-06 DIAGNOSIS — D689 Coagulation defect, unspecified: Secondary | ICD-10-CM | POA: Diagnosis not present

## 2018-07-06 DIAGNOSIS — N2581 Secondary hyperparathyroidism of renal origin: Secondary | ICD-10-CM | POA: Diagnosis not present

## 2018-07-06 DIAGNOSIS — D631 Anemia in chronic kidney disease: Secondary | ICD-10-CM | POA: Diagnosis not present

## 2018-07-08 DIAGNOSIS — D631 Anemia in chronic kidney disease: Secondary | ICD-10-CM | POA: Diagnosis not present

## 2018-07-08 DIAGNOSIS — N186 End stage renal disease: Secondary | ICD-10-CM | POA: Diagnosis not present

## 2018-07-08 DIAGNOSIS — N2581 Secondary hyperparathyroidism of renal origin: Secondary | ICD-10-CM | POA: Diagnosis not present

## 2018-07-08 DIAGNOSIS — D689 Coagulation defect, unspecified: Secondary | ICD-10-CM | POA: Diagnosis not present

## 2018-07-11 DIAGNOSIS — N2581 Secondary hyperparathyroidism of renal origin: Secondary | ICD-10-CM | POA: Diagnosis not present

## 2018-07-11 DIAGNOSIS — D631 Anemia in chronic kidney disease: Secondary | ICD-10-CM | POA: Diagnosis not present

## 2018-07-11 DIAGNOSIS — D689 Coagulation defect, unspecified: Secondary | ICD-10-CM | POA: Diagnosis not present

## 2018-07-11 DIAGNOSIS — N186 End stage renal disease: Secondary | ICD-10-CM | POA: Diagnosis not present

## 2018-07-13 DIAGNOSIS — D689 Coagulation defect, unspecified: Secondary | ICD-10-CM | POA: Diagnosis not present

## 2018-07-13 DIAGNOSIS — N186 End stage renal disease: Secondary | ICD-10-CM | POA: Diagnosis not present

## 2018-07-13 DIAGNOSIS — N2581 Secondary hyperparathyroidism of renal origin: Secondary | ICD-10-CM | POA: Diagnosis not present

## 2018-07-13 DIAGNOSIS — D631 Anemia in chronic kidney disease: Secondary | ICD-10-CM | POA: Diagnosis not present

## 2018-07-14 DIAGNOSIS — Z992 Dependence on renal dialysis: Secondary | ICD-10-CM | POA: Diagnosis not present

## 2018-07-14 DIAGNOSIS — N186 End stage renal disease: Secondary | ICD-10-CM | POA: Diagnosis not present

## 2018-07-14 DIAGNOSIS — I871 Compression of vein: Secondary | ICD-10-CM | POA: Diagnosis not present

## 2018-07-15 DIAGNOSIS — D631 Anemia in chronic kidney disease: Secondary | ICD-10-CM | POA: Diagnosis not present

## 2018-07-15 DIAGNOSIS — N186 End stage renal disease: Secondary | ICD-10-CM | POA: Diagnosis not present

## 2018-07-15 DIAGNOSIS — N2581 Secondary hyperparathyroidism of renal origin: Secondary | ICD-10-CM | POA: Diagnosis not present

## 2018-07-15 DIAGNOSIS — D689 Coagulation defect, unspecified: Secondary | ICD-10-CM | POA: Diagnosis not present

## 2018-07-15 DIAGNOSIS — Z4802 Encounter for removal of sutures: Secondary | ICD-10-CM | POA: Insufficient documentation

## 2018-07-18 DIAGNOSIS — N2581 Secondary hyperparathyroidism of renal origin: Secondary | ICD-10-CM | POA: Diagnosis not present

## 2018-07-18 DIAGNOSIS — D631 Anemia in chronic kidney disease: Secondary | ICD-10-CM | POA: Diagnosis not present

## 2018-07-18 DIAGNOSIS — D689 Coagulation defect, unspecified: Secondary | ICD-10-CM | POA: Diagnosis not present

## 2018-07-18 DIAGNOSIS — N186 End stage renal disease: Secondary | ICD-10-CM | POA: Diagnosis not present

## 2018-07-19 ENCOUNTER — Ambulatory Visit
Admission: RE | Admit: 2018-07-19 | Discharge: 2018-07-19 | Disposition: A | Discharge status: Home / Self Care | Payer: Medicare HMO | Source: Ambulatory Visit | Attending: Family Medicine | Admitting: Family Medicine

## 2018-07-19 DIAGNOSIS — Z1231 Encounter for screening mammogram for malignant neoplasm of breast: Secondary | ICD-10-CM

## 2018-07-20 DIAGNOSIS — D631 Anemia in chronic kidney disease: Secondary | ICD-10-CM | POA: Diagnosis not present

## 2018-07-20 DIAGNOSIS — D689 Coagulation defect, unspecified: Secondary | ICD-10-CM | POA: Diagnosis not present

## 2018-07-20 DIAGNOSIS — N186 End stage renal disease: Secondary | ICD-10-CM | POA: Diagnosis not present

## 2018-07-20 DIAGNOSIS — N2581 Secondary hyperparathyroidism of renal origin: Secondary | ICD-10-CM | POA: Diagnosis not present

## 2018-07-22 DIAGNOSIS — D631 Anemia in chronic kidney disease: Secondary | ICD-10-CM | POA: Diagnosis not present

## 2018-07-22 DIAGNOSIS — D689 Coagulation defect, unspecified: Secondary | ICD-10-CM | POA: Diagnosis not present

## 2018-07-22 DIAGNOSIS — N186 End stage renal disease: Secondary | ICD-10-CM | POA: Diagnosis not present

## 2018-07-22 DIAGNOSIS — N2581 Secondary hyperparathyroidism of renal origin: Secondary | ICD-10-CM | POA: Diagnosis not present

## 2018-07-23 ENCOUNTER — Other Ambulatory Visit (HOSPITAL_COMMUNITY): Payer: Self-pay | Admitting: Cardiology

## 2018-07-24 NOTE — Telephone Encounter (Signed)
Pt of Dr. Gwenlyn Found. Please address. Thank you.

## 2018-07-24 NOTE — Telephone Encounter (Signed)
Rx has been sent to the pharmacy electronically. ° °

## 2018-07-25 DIAGNOSIS — N2581 Secondary hyperparathyroidism of renal origin: Secondary | ICD-10-CM | POA: Diagnosis not present

## 2018-07-25 DIAGNOSIS — D689 Coagulation defect, unspecified: Secondary | ICD-10-CM | POA: Diagnosis not present

## 2018-07-25 DIAGNOSIS — N186 End stage renal disease: Secondary | ICD-10-CM | POA: Diagnosis not present

## 2018-07-25 DIAGNOSIS — D631 Anemia in chronic kidney disease: Secondary | ICD-10-CM | POA: Diagnosis not present

## 2018-07-27 ENCOUNTER — Telehealth: Payer: Self-pay | Admitting: *Deleted

## 2018-07-27 DIAGNOSIS — N186 End stage renal disease: Secondary | ICD-10-CM | POA: Diagnosis not present

## 2018-07-27 DIAGNOSIS — N2581 Secondary hyperparathyroidism of renal origin: Secondary | ICD-10-CM | POA: Diagnosis not present

## 2018-07-27 DIAGNOSIS — D631 Anemia in chronic kidney disease: Secondary | ICD-10-CM | POA: Diagnosis not present

## 2018-07-27 DIAGNOSIS — D689 Coagulation defect, unspecified: Secondary | ICD-10-CM | POA: Diagnosis not present

## 2018-07-27 NOTE — Telephone Encounter (Signed)
   [facility] Pre-operative Risk Assessment    Request for surgical clearance:  1. What type of surgery is being performed? SCALING AND ROOT PLANNING   2. When is this surgery scheduled? TBD   3. What type of clearance is required (medical clearance vs. Pharmacy clearance to hold med vs. Both)? MEDICAL  4. Are there any medications that need to be held prior to surgery and how long?PLAVIX    5. Practice name and name of physician performing surgery? [facility]   6. What is your office phone number [phone]    7.   What is your office fax number [phone]  8.   Anesthesia type (None, local, MAC, general) ? LOCAL   Julaine Hua 07/27/2018, 11:18 AM  _________________________________________________________________   (provider comments below)

## 2018-07-28 NOTE — Telephone Encounter (Signed)
   Primary Cardiologist: [name], MD  Chart reviewed as part of pre-operative protocol coverage. Simple dental extractions are considered low risk procedures per guidelines and generally do not require any specific cardiac clearance. It is also generally accepted that for simple dental procedures (except multiple tooth extractions) there is no need to interrupt blood thinner therapy.   SBE prophylaxis is/is not required for the patient.  I will route this recommendation to the requesting party via Epic fax function and remove from pre-op pool.  Please call with questions.  Kerin Ransom, PA-C 07/28/2018, 2:34 PM

## 2018-07-29 DIAGNOSIS — N186 End stage renal disease: Secondary | ICD-10-CM | POA: Diagnosis not present

## 2018-07-29 DIAGNOSIS — N2581 Secondary hyperparathyroidism of renal origin: Secondary | ICD-10-CM | POA: Diagnosis not present

## 2018-07-29 DIAGNOSIS — D631 Anemia in chronic kidney disease: Secondary | ICD-10-CM | POA: Diagnosis not present

## 2018-07-29 DIAGNOSIS — D689 Coagulation defect, unspecified: Secondary | ICD-10-CM | POA: Diagnosis not present

## 2018-08-01 DIAGNOSIS — D689 Coagulation defect, unspecified: Secondary | ICD-10-CM | POA: Diagnosis not present

## 2018-08-01 DIAGNOSIS — N2581 Secondary hyperparathyroidism of renal origin: Secondary | ICD-10-CM | POA: Diagnosis not present

## 2018-08-01 DIAGNOSIS — N186 End stage renal disease: Secondary | ICD-10-CM | POA: Diagnosis not present

## 2018-08-01 DIAGNOSIS — D631 Anemia in chronic kidney disease: Secondary | ICD-10-CM | POA: Diagnosis not present

## 2018-08-03 DIAGNOSIS — D631 Anemia in chronic kidney disease: Secondary | ICD-10-CM | POA: Diagnosis not present

## 2018-08-03 DIAGNOSIS — N186 End stage renal disease: Secondary | ICD-10-CM | POA: Diagnosis not present

## 2018-08-03 DIAGNOSIS — D689 Coagulation defect, unspecified: Secondary | ICD-10-CM | POA: Diagnosis not present

## 2018-08-03 DIAGNOSIS — N2581 Secondary hyperparathyroidism of renal origin: Secondary | ICD-10-CM | POA: Diagnosis not present

## 2018-08-04 DIAGNOSIS — E1151 Type 2 diabetes mellitus with diabetic peripheral angiopathy without gangrene: Secondary | ICD-10-CM | POA: Diagnosis not present

## 2018-08-04 DIAGNOSIS — Z992 Dependence on renal dialysis: Secondary | ICD-10-CM | POA: Diagnosis not present

## 2018-08-04 DIAGNOSIS — N186 End stage renal disease: Secondary | ICD-10-CM | POA: Diagnosis not present

## 2018-08-04 DIAGNOSIS — M109 Gout, unspecified: Secondary | ICD-10-CM | POA: Diagnosis not present

## 2018-08-04 DIAGNOSIS — E11319 Type 2 diabetes mellitus with unspecified diabetic retinopathy without macular edema: Secondary | ICD-10-CM | POA: Diagnosis not present

## 2018-08-04 DIAGNOSIS — E1140 Type 2 diabetes mellitus with diabetic neuropathy, unspecified: Secondary | ICD-10-CM | POA: Diagnosis not present

## 2018-08-04 DIAGNOSIS — S98111A Complete traumatic amputation of right great toe, initial encounter: Secondary | ICD-10-CM | POA: Diagnosis not present

## 2018-08-04 DIAGNOSIS — E1122 Type 2 diabetes mellitus with diabetic chronic kidney disease: Secondary | ICD-10-CM | POA: Diagnosis not present

## 2018-08-04 DIAGNOSIS — R252 Cramp and spasm: Secondary | ICD-10-CM | POA: Diagnosis not present

## 2018-09-06 ENCOUNTER — Ambulatory Visit: Payer: Medicare HMO | Admitting: Cardiovascular Disease

## 2018-09-06 ENCOUNTER — Telehealth: Payer: Self-pay

## 2018-09-06 NOTE — Telephone Encounter (Signed)
Spoke with representative from pt's dental office, [facility], regarding request for Plavix hold prior to multiple tooth extractions. Face-to-face consult with Dr. Gwenlyn Found resulted in recommendation to hold Plavix for 7 days prior to multiple tooth extractions. Rep verbalized understanding and states that she will also need Plavix hold recommendations in writing and will fax form to [facility] office. Awaiting fax

## 2018-09-07 ENCOUNTER — Encounter: Payer: Self-pay | Admitting: *Deleted

## 2018-09-07 NOTE — Telephone Encounter (Signed)
Faxed form and requested documentation to [facility] Fax #: [phone]

## 2018-09-14 ENCOUNTER — Other Ambulatory Visit: Payer: Self-pay | Admitting: Cardiovascular Disease

## 2018-09-14 DIAGNOSIS — I70229 Atherosclerosis of native arteries of extremities with rest pain, unspecified extremity: Secondary | ICD-10-CM

## 2018-09-14 DIAGNOSIS — I998 Other disorder of circulatory system: Secondary | ICD-10-CM

## 2018-09-21 ENCOUNTER — Encounter (HOSPITAL_COMMUNITY): Payer: Medicare HMO

## 2018-09-27 ENCOUNTER — Other Ambulatory Visit: Payer: Self-pay | Admitting: Cardiology

## 2018-10-23 ENCOUNTER — Encounter (HOSPITAL_COMMUNITY): Payer: Medicare HMO

## 2018-11-05 ENCOUNTER — Other Ambulatory Visit: Payer: Self-pay | Admitting: Cardiovascular Disease

## 2018-12-06 ENCOUNTER — Telehealth: Payer: Self-pay | Admitting: *Deleted

## 2018-12-06 ENCOUNTER — Telehealth: Payer: Self-pay | Admitting: Cardiovascular Disease

## 2018-12-06 NOTE — Telephone Encounter (Signed)
Spoke with patient again and she verified that she is ok with a telephone visit with Dr. Gwenlyn Found on December 13, 2018 at 11:30 AM.

## 2018-12-06 NOTE — Telephone Encounter (Signed)
Spoke with patient and she did not want to come into office on Dr. Kennon Holter DOD day due to transportation issues so we rescheduled her for a telephone visit with Dr. Gwenlyn Found on December 13, 2018 at 11:30, which is a virtual clinic day.

## 2018-12-06 NOTE — Telephone Encounter (Signed)
Phone visit/my chart/pre reg complete/consent obtained/call [phone]/GY said that she had no transportation to come into the office on 06/10  --ttf

## 2018-12-11 ENCOUNTER — Other Ambulatory Visit: Payer: Self-pay

## 2018-12-11 ENCOUNTER — Other Ambulatory Visit (HOSPITAL_COMMUNITY): Payer: Self-pay | Admitting: Cardiovascular Disease

## 2018-12-11 ENCOUNTER — Ambulatory Visit (HOSPITAL_COMMUNITY)
Admission: RE | Admit: 2018-12-11 | Discharge: 2018-12-11 | Disposition: A | Discharge status: Home / Self Care | Payer: Medicare HMO | Source: Ambulatory Visit | Attending: Cardiology | Admitting: Cardiology

## 2018-12-11 DIAGNOSIS — I70229 Atherosclerosis of native arteries of extremities with rest pain, unspecified extremity: Secondary | ICD-10-CM

## 2018-12-11 DIAGNOSIS — I998 Other disorder of circulatory system: Secondary | ICD-10-CM | POA: Diagnosis not present

## 2018-12-11 DIAGNOSIS — I739 Peripheral vascular disease, unspecified: Secondary | ICD-10-CM

## 2018-12-12 ENCOUNTER — Other Ambulatory Visit: Payer: Self-pay

## 2018-12-12 DIAGNOSIS — I739 Peripheral vascular disease, unspecified: Secondary | ICD-10-CM

## 2018-12-13 ENCOUNTER — Telehealth (INDEPENDENT_AMBULATORY_CARE_PROVIDER_SITE_OTHER): Payer: Medicare HMO | Admitting: Cardiovascular Disease

## 2018-12-13 ENCOUNTER — Telehealth: Payer: Self-pay

## 2018-12-13 DIAGNOSIS — I10 Essential (primary) hypertension: Secondary | ICD-10-CM

## 2018-12-13 DIAGNOSIS — I70229 Atherosclerosis of native arteries of extremities with rest pain, unspecified extremity: Secondary | ICD-10-CM

## 2018-12-13 DIAGNOSIS — E7800 Pure hypercholesterolemia, unspecified: Secondary | ICD-10-CM

## 2018-12-13 DIAGNOSIS — I70261 Atherosclerosis of native arteries of extremities with gangrene, right leg: Secondary | ICD-10-CM

## 2018-12-13 DIAGNOSIS — N186 End stage renal disease: Secondary | ICD-10-CM

## 2018-12-13 DIAGNOSIS — I998 Other disorder of circulatory system: Secondary | ICD-10-CM

## 2018-12-13 NOTE — Patient Instructions (Addendum)
Medication Instructions:  Your physician recommends that you continue on your current medications as directed. Please refer to the Current Medication list given to you today.  If you need a refill on your cardiac medications before your next appointment, please call your pharmacy.   Lab work: NONE If you have labs (blood work) drawn today and your tests are completely normal, you will receive your results only by: Marland Kitchen MyChart Message (if you have MyChart) OR . A paper copy in the mail If you have any lab test that is abnormal or we need to change your treatment, we will call you to review the results.  Testing/Procedures: Your physician has requested that you have a lower or upper extremity arterial duplex. This test is an ultrasound of the arteries in the legs or arms. It looks at arterial blood flow in the legs and arms. Allow one hour for Lower and Upper Arterial scans. There are no restrictions or special instructions.  TO BE SCHEDULED FOR June 2021. YOU WILL BE CONTACTED BY A SCHEDULER TO SET UP AN APPOINTMENT.   Your physician has requested that you have an ankle brachial index (ABI). During this test an ultrasound and blood pressure cuff are used to evaluate the arteries that supply the arms and legs with blood. Allow thirty minutes for this exam. There are no restrictions or special instructions.  TO BE SCHEDULED FOR June 2021. YOU WILL BE CONTACTED BY A SCHEDULER TO SET UP AN APPOINTMENT.    Follow-Up: At [facility], you and your health needs are our priority.  As part of our continuing mission to provide you with exceptional heart care, we have created designated Provider Care Teams.  These Care Teams include your primary Cardiologist (physician) and Advanced Practice Providers (APPs -  Physician Assistants and Nurse Practitioners) who all work together to provide you with the care you need, when you need it. You will need a follow up appointment in 12 months with Dr. Gwenlyn Found.  Please  call our office 2 months in advance to schedule this appointment.  Please have your ultrasound studies completed before your follow up appointment.

## 2018-12-13 NOTE — Progress Notes (Signed)
Virtual Visit via Telephone Note   This visit type was conducted due to national recommendations for restrictions regarding the COVID-19 Pandemic (e.g. social distancing) in an effort to limit this patient's exposure and mitigate transmission in our community.  Due to her co-morbid illnesses, this patient is at least at moderate risk for complications without adequate follow up.  This format is felt to be most appropriate for this patient at this time.  The patient did not have access to video technology/had technical difficulties with video requiring transitioning to audio format only (telephone).  All issues noted in this document were discussed and addressed.  No physical exam could be performed with this format.  Please refer to the patient's chart for her  consent to telehealth for [facility].   Date:  12/13/2018   ID:  Joanne, Carlson [birth year], MRN [id number]  Patient Location: Home Provider Location: Home  PCP:  Vernie Shanks, MD  Cardiologist:  [name], MD  Electrophysiologist:  None   Evaluation Performed:  Follow-Up Visit  Chief Complaint: Follow-up critical limb ischemia  History of Present Illness:    Joanne Carlson is a 73y.o.  mother of 2 children referred by Dr. Amalia Hailey for peripheral vascular evaluation because of critical limb ischemia.Last saw her in the office  03/03/2018. She has a history of treated hypertension, diabetes and hyperlipidemia. 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 Dr. Adele Barthel placed an AV fistula. She's never had a heart attack or stroke. She denies chest pain or shortness of breath. She's had a small non-healing ulcer on her left great toe treated with antibiotics recently and according to her this is slowly healing. Lower extremity arterial Doppler studies performed in our office 01/03/17 revealed ABIs approximately 0.8 bilaterally with an occluded distal right SFA and high-grade left SFA  stenosis with three-vessel runoff.  She had developed a nonhealing wound on her right great toe.  She is currently on hemodialysis.  I angiogram to her 01/30/2018 revealing a 99% calcified mid right SFA stenosis.  I performed chocolate balloon atherectomy followed by drug-eluting balloon angioplasty with excellent result.  Dopplers improved.  Her wound ultimately healed.  Since I saw her a year ago she continues to do well.  The wound on her right great toe has healed and her Dopplers performed 12/11/2018 suggest a patent right SFA.  She denies chest pain or shortness of breath.  She is sheltering in place and socially distancing..   The patient does not have symptoms concerning for COVID-19 infection (fever, chills, cough, or new shortness of breath).    Past Medical History:  Diagnosis Date   Arthritis    "legs, knees; not bad" (01/30/2018)   Dizziness    occasionally   ESRD (end stage renal disease) ([place])    [name]; TTS" (01/30/2018)   History of colon polyps    History of gout    takes Allopurinol daily (01/30/2018)   Hyperlipidemia    takes Pravastatin daily   Hypertension    takes Monopril daily   Joint pain    PAD (peripheral artery disease) ([place])    Peripheral neuropathy    Peripheral vascular disease ([place])    Pneumonia    "once; years ago" (01/30/2018)   Refusal of blood transfusions as patient is Jehovah's Witness    NO BLOOD OR BLOOD PRODUCTS. Albumin okay.    Stroke [facility]) 1990s   "mini stroke; left lower mouth a  little bit twisted since" (01/30/2018)   Type II diabetes mellitus ([place])    diet controlled-   Past Surgical History:  Procedure Laterality Date   ABDOMINAL AORTOGRAM W/LOWER EXTREMITY N/A 01/30/2018   Procedure: ABDOMINAL AORTOGRAM W/LOWER EXTREMITY;  Surgeon: Lorretta Harp, MD;  Location: [place] [facility];  Service: Cardiovascular;  Laterality: N/A;   APPENDECTOMY     BASCILIC VEIN TRANSPOSITION Right 10/03/2013   Procedure:  BRACHIOCEPHALIC Arteriovenous Fistula ;  Surgeon: Mal Misty, MD;  Location: [place];  Service: Vascular;  Laterality: Right;   [place] Right 11/17/2016   Procedure: [place]- RIGHT SINGLE STAGE;  Surgeon: Conrad Albion, MD;  Location: [place];  Service: Vascular;  Laterality: Right;   COLONOSCOPY     CYST EXCISION  1980's   cyst removed from lower abdomen   DILATION AND CURETTAGE OF UTERUS     ESOPHAGOGASTRODUODENOSCOPY     PERIPHERAL VASCULAR BALLOON ANGIOPLASTY Right 01/30/2018   Procedure: PERIPHERAL VASCULAR BALLOON ANGIOPLASTY;  Surgeon: Lorretta Harp, MD;  Location: [place] [facility];  Service: Cardiovascular;  Laterality: Right;  superficial femoral   SHOULDER ARTHROSCOPY W/ ROTATOR CUFF REPAIR Right 2013   TUBAL LIGATION     UPPER EXTREMITY VENOGRAPHY Bilateral 11/08/2016   Procedure: Bilateral Upper Extremity Venography;  Surgeon: Conrad Shelby, MD;  Location: [place] [facility];  Service: Cardiovascular;  Laterality: Bilateral;     Current Meds  Medication Sig   allopurinol (ZYLOPRIM) 100 MG tablet Take 200 mg by mouth daily.    aspirin EC 81 MG tablet Take 81 mg by mouth daily.   atorvastatin (LIPITOR) 80 MG tablet TAKE 1 TABLET BY MOUTH ONCE DAILY AT  6  PM   b complex-vitamin c-folic acid (NEPHRO-VITE) 0.8 MG TABS tablet Take 1 tablet by mouth daily.   Blood Glucose Monitoring Suppl (ACCU-CHEK AVIVA PLUS) w/Device KIT    calcium acetate (PHOSLO) 667 MG capsule Take 667-1,334 mg by mouth See admin instructions. Take 2 caps three times a day with meals. Take 1 cap with snacks twice daily.   cinacalcet (SENSIPAR) 60 MG tablet Take 60 mg by mouth daily.    clopidogrel (PLAVIX) 75 MG tablet Take 1 tablet by mouth once daily with breakfast   fosinopril (MONOPRIL) 40 MG tablet Take 40 mg by mouth 2 (two) times daily.   gabapentin (NEURONTIN) 100 MG capsule Take 1 capsule by mouth every morning and 1 capsule at bedtime.  PATIENT NEEDS OFFICE VISIT FOR ADDITIONAL REFILLS (Patient taking differently: Take 100 mg by mouth daily as needed (pain). )   glucose blood test strip Use to test blood sugar daily. Dx code: 70.02.   Lancets MISC Use to test blood sugar daily. Dx code 250.02.   lidocaine-prilocaine (EMLA) cream Apply 1 application topically 3 (three) times a week. Tuesday, Thursday and Saturday     Allergies:   Tylenol [acetaminophen]   Social History   Tobacco Use   Smoking status: Former Smoker    Years: 10.00    Types: Cigarettes    Last attempt to quit: 1970    Years since quitting: 50.4   Smokeless tobacco: Never Used   Tobacco comment: "never a heavy smoker; smoked off and on"  Substance Use Topics   Alcohol use: Not Currently    Comment: "in my younger days"   Drug use: Not Currently    Types: Marijuana    Comment: "weed in my teens"     Family Hx:  The patient's family history includes Depression in her father, mother, and sister; Hypertension in her mother and sister.  ROS:   Please see the history of present illness.     All other systems reviewed and are negative.   Prior CV studies:   The following studies were reviewed today:  Lower extremity arterial Doppler studies performed 12/11/2018  Labs/Other Tests and Data Reviewed:    EKG:  No ECG reviewed.  Recent Labs: 01/31/2018: BUN 53; Creatinine, Ser 7.29; Hemoglobin 10.8; Platelets 175; Potassium 5.8; Sodium 128   Recent Lipid Panel Lab Results  Component Value Date/Time   CHOL 275 (H) 07/22/2008 10:41 PM   TRIG 211 (H) 07/22/2008 10:41 PM   HDL 41 07/22/2008 10:41 PM   CHOLHDL 6.7 Ratio 07/22/2008 10:41 PM   LDLCALC 192 (H) 07/22/2008 10:41 PM    Wt Readings from Last 3 Encounters:  12/13/18 179 lb (81.2 kg)  03/03/18 163 lb (73.9 kg)  01/31/18 167 lb 5.3 oz (75.9 kg)     Objective:    Vital Signs:  Ht 5' 4" (1.626 m)    Wt 179 lb (81.2 kg)    BMI 30.73 kg/m    VITAL SIGNS:  reviewed a complete  physical exam was not performed today since this was a virtual telemedicine phone visit  ASSESSMENT & PLAN:    1. Critical limb ischemia- history of critical limb ischemia with a nonhealing ulcer on her right great toe.  She is status post peripheral angiography 01/30/2018 performed by myself revealing a 99% calcified mid right SFA stenosis.  She underwent chocolate balloon angioplasty followed followed by drug-coated balloon angioplasty.  Her wound ultimately healed.  Recent Dopplers performed 12/11/2018 suggested a patent right SFA. 2. Hyperlipidemia- history of hyperlipidemia on atorvastatin with lipid profile performed 09/15/2018 revealing total cholesterol 140, LDL 77 and HDL 35. 3. Chronic renal insufficiency-on hemodialysis  COVID-19 Education: The signs and symptoms of COVID-19 were discussed with the patient and how to seek care for testing (follow up with PCP or arrange E-visit).  The importance of social distancing was discussed today.  Time:   Today, I have spent 5 minutes with the patient with telehealth technology discussing the above problems.     Medication Adjustments/Labs and Tests Ordered: Current medicines are reviewed at length with the patient today.  Concerns regarding medicines are outlined above.   Tests Ordered: No orders of the defined types were placed in this encounter.   Medication Changes: No orders of the defined types were placed in this encounter.   Disposition:  Follow up in 1 year(s)  Signed, [name], MD  12/13/2018 11:24 AM    [facility]

## 2018-12-13 NOTE — Telephone Encounter (Signed)
Patient and/or DPR-approved person aware of 6/10 AVS instructions and verbalized understanding. Letter including After Visit Summary and any other necessary documents to be mailed to the patient's address on file.

## 2018-12-15 ENCOUNTER — Telehealth: Payer: Medicare HMO | Admitting: Cardiovascular Disease

## 2019-02-03 DIAGNOSIS — N2581 Secondary hyperparathyroidism of renal origin: Secondary | ICD-10-CM | POA: Diagnosis not present

## 2019-02-03 DIAGNOSIS — N186 End stage renal disease: Secondary | ICD-10-CM | POA: Diagnosis not present

## 2019-02-03 DIAGNOSIS — Z992 Dependence on renal dialysis: Secondary | ICD-10-CM | POA: Diagnosis not present

## 2019-02-05 DIAGNOSIS — T7840XA Allergy, unspecified, initial encounter: Secondary | ICD-10-CM | POA: Insufficient documentation

## 2019-02-05 DIAGNOSIS — T782XXA Anaphylactic shock, unspecified, initial encounter: Secondary | ICD-10-CM | POA: Insufficient documentation

## 2019-02-06 DIAGNOSIS — N2581 Secondary hyperparathyroidism of renal origin: Secondary | ICD-10-CM | POA: Diagnosis not present

## 2019-02-06 DIAGNOSIS — N186 End stage renal disease: Secondary | ICD-10-CM | POA: Diagnosis not present

## 2019-02-06 DIAGNOSIS — Z992 Dependence on renal dialysis: Secondary | ICD-10-CM | POA: Diagnosis not present

## 2019-02-08 DIAGNOSIS — N186 End stage renal disease: Secondary | ICD-10-CM | POA: Diagnosis not present

## 2019-02-08 DIAGNOSIS — Z992 Dependence on renal dialysis: Secondary | ICD-10-CM | POA: Diagnosis not present

## 2019-02-08 DIAGNOSIS — N2581 Secondary hyperparathyroidism of renal origin: Secondary | ICD-10-CM | POA: Diagnosis not present

## 2019-02-10 DIAGNOSIS — Z992 Dependence on renal dialysis: Secondary | ICD-10-CM | POA: Diagnosis not present

## 2019-02-10 DIAGNOSIS — N186 End stage renal disease: Secondary | ICD-10-CM | POA: Diagnosis not present

## 2019-02-10 DIAGNOSIS — N2581 Secondary hyperparathyroidism of renal origin: Secondary | ICD-10-CM | POA: Diagnosis not present

## 2019-02-13 DIAGNOSIS — N2581 Secondary hyperparathyroidism of renal origin: Secondary | ICD-10-CM | POA: Diagnosis not present

## 2019-02-13 DIAGNOSIS — Z992 Dependence on renal dialysis: Secondary | ICD-10-CM | POA: Diagnosis not present

## 2019-02-13 DIAGNOSIS — N186 End stage renal disease: Secondary | ICD-10-CM | POA: Diagnosis not present

## 2019-02-15 DIAGNOSIS — N186 End stage renal disease: Secondary | ICD-10-CM | POA: Diagnosis not present

## 2019-02-15 DIAGNOSIS — Z992 Dependence on renal dialysis: Secondary | ICD-10-CM | POA: Diagnosis not present

## 2019-02-15 DIAGNOSIS — N2581 Secondary hyperparathyroidism of renal origin: Secondary | ICD-10-CM | POA: Diagnosis not present

## 2019-02-17 DIAGNOSIS — Z992 Dependence on renal dialysis: Secondary | ICD-10-CM | POA: Diagnosis not present

## 2019-02-17 DIAGNOSIS — N186 End stage renal disease: Secondary | ICD-10-CM | POA: Diagnosis not present

## 2019-02-17 DIAGNOSIS — N2581 Secondary hyperparathyroidism of renal origin: Secondary | ICD-10-CM | POA: Diagnosis not present

## 2019-02-17 DIAGNOSIS — H5203 Hypermetropia, bilateral: Secondary | ICD-10-CM | POA: Diagnosis not present

## 2019-02-20 DIAGNOSIS — Z992 Dependence on renal dialysis: Secondary | ICD-10-CM | POA: Diagnosis not present

## 2019-02-20 DIAGNOSIS — N2581 Secondary hyperparathyroidism of renal origin: Secondary | ICD-10-CM | POA: Diagnosis not present

## 2019-02-20 DIAGNOSIS — N186 End stage renal disease: Secondary | ICD-10-CM | POA: Diagnosis not present

## 2019-02-22 DIAGNOSIS — N2581 Secondary hyperparathyroidism of renal origin: Secondary | ICD-10-CM | POA: Diagnosis not present

## 2019-02-22 DIAGNOSIS — N186 End stage renal disease: Secondary | ICD-10-CM | POA: Diagnosis not present

## 2019-02-22 DIAGNOSIS — Z992 Dependence on renal dialysis: Secondary | ICD-10-CM | POA: Diagnosis not present

## 2019-02-24 DIAGNOSIS — Z992 Dependence on renal dialysis: Secondary | ICD-10-CM | POA: Diagnosis not present

## 2019-02-24 DIAGNOSIS — N2581 Secondary hyperparathyroidism of renal origin: Secondary | ICD-10-CM | POA: Diagnosis not present

## 2019-02-24 DIAGNOSIS — N186 End stage renal disease: Secondary | ICD-10-CM | POA: Diagnosis not present

## 2019-02-27 DIAGNOSIS — N186 End stage renal disease: Secondary | ICD-10-CM | POA: Diagnosis not present

## 2019-02-27 DIAGNOSIS — Z992 Dependence on renal dialysis: Secondary | ICD-10-CM | POA: Diagnosis not present

## 2019-02-27 DIAGNOSIS — N2581 Secondary hyperparathyroidism of renal origin: Secondary | ICD-10-CM | POA: Diagnosis not present

## 2019-03-01 DIAGNOSIS — N186 End stage renal disease: Secondary | ICD-10-CM | POA: Diagnosis not present

## 2019-03-01 DIAGNOSIS — N2581 Secondary hyperparathyroidism of renal origin: Secondary | ICD-10-CM | POA: Diagnosis not present

## 2019-03-01 DIAGNOSIS — Z992 Dependence on renal dialysis: Secondary | ICD-10-CM | POA: Diagnosis not present

## 2019-03-03 DIAGNOSIS — Z992 Dependence on renal dialysis: Secondary | ICD-10-CM | POA: Diagnosis not present

## 2019-03-03 DIAGNOSIS — N186 End stage renal disease: Secondary | ICD-10-CM | POA: Diagnosis not present

## 2019-03-03 DIAGNOSIS — N2581 Secondary hyperparathyroidism of renal origin: Secondary | ICD-10-CM | POA: Diagnosis not present

## 2019-03-05 DIAGNOSIS — E1122 Type 2 diabetes mellitus with diabetic chronic kidney disease: Secondary | ICD-10-CM | POA: Diagnosis not present

## 2019-03-05 DIAGNOSIS — N186 End stage renal disease: Secondary | ICD-10-CM | POA: Diagnosis not present

## 2019-03-05 DIAGNOSIS — Z992 Dependence on renal dialysis: Secondary | ICD-10-CM | POA: Diagnosis not present

## 2019-03-06 DIAGNOSIS — N2581 Secondary hyperparathyroidism of renal origin: Secondary | ICD-10-CM | POA: Diagnosis not present

## 2019-03-06 DIAGNOSIS — N186 End stage renal disease: Secondary | ICD-10-CM | POA: Diagnosis not present

## 2019-03-06 DIAGNOSIS — Z992 Dependence on renal dialysis: Secondary | ICD-10-CM | POA: Diagnosis not present

## 2019-03-08 DIAGNOSIS — N186 End stage renal disease: Secondary | ICD-10-CM | POA: Diagnosis not present

## 2019-03-08 DIAGNOSIS — Z992 Dependence on renal dialysis: Secondary | ICD-10-CM | POA: Diagnosis not present

## 2019-03-08 DIAGNOSIS — N2581 Secondary hyperparathyroidism of renal origin: Secondary | ICD-10-CM | POA: Diagnosis not present

## 2019-03-10 DIAGNOSIS — N186 End stage renal disease: Secondary | ICD-10-CM | POA: Diagnosis not present

## 2019-03-10 DIAGNOSIS — N2581 Secondary hyperparathyroidism of renal origin: Secondary | ICD-10-CM | POA: Diagnosis not present

## 2019-03-10 DIAGNOSIS — Z992 Dependence on renal dialysis: Secondary | ICD-10-CM | POA: Diagnosis not present

## 2019-03-13 DIAGNOSIS — N186 End stage renal disease: Secondary | ICD-10-CM | POA: Diagnosis not present

## 2019-03-13 DIAGNOSIS — Z992 Dependence on renal dialysis: Secondary | ICD-10-CM | POA: Diagnosis not present

## 2019-03-13 DIAGNOSIS — N2581 Secondary hyperparathyroidism of renal origin: Secondary | ICD-10-CM | POA: Diagnosis not present

## 2019-03-15 DIAGNOSIS — N186 End stage renal disease: Secondary | ICD-10-CM | POA: Diagnosis not present

## 2019-03-15 DIAGNOSIS — N2581 Secondary hyperparathyroidism of renal origin: Secondary | ICD-10-CM | POA: Diagnosis not present

## 2019-03-15 DIAGNOSIS — Z992 Dependence on renal dialysis: Secondary | ICD-10-CM | POA: Diagnosis not present

## 2019-03-17 DIAGNOSIS — Z992 Dependence on renal dialysis: Secondary | ICD-10-CM | POA: Diagnosis not present

## 2019-03-17 DIAGNOSIS — N2581 Secondary hyperparathyroidism of renal origin: Secondary | ICD-10-CM | POA: Diagnosis not present

## 2019-03-17 DIAGNOSIS — N186 End stage renal disease: Secondary | ICD-10-CM | POA: Diagnosis not present

## 2019-03-19 DIAGNOSIS — Z905 Acquired absence of kidney: Secondary | ICD-10-CM | POA: Diagnosis not present

## 2019-03-19 DIAGNOSIS — I10 Essential (primary) hypertension: Secondary | ICD-10-CM | POA: Diagnosis not present

## 2019-03-19 DIAGNOSIS — N185 Chronic kidney disease, stage 5: Secondary | ICD-10-CM | POA: Diagnosis not present

## 2019-03-19 DIAGNOSIS — E118 Type 2 diabetes mellitus with unspecified complications: Secondary | ICD-10-CM | POA: Diagnosis not present

## 2019-03-19 DIAGNOSIS — E785 Hyperlipidemia, unspecified: Secondary | ICD-10-CM | POA: Diagnosis not present

## 2019-03-19 DIAGNOSIS — E669 Obesity, unspecified: Secondary | ICD-10-CM | POA: Diagnosis not present

## 2019-03-19 DIAGNOSIS — M109 Gout, unspecified: Secondary | ICD-10-CM | POA: Diagnosis not present

## 2019-03-19 DIAGNOSIS — N2581 Secondary hyperparathyroidism of renal origin: Secondary | ICD-10-CM | POA: Diagnosis not present

## 2019-03-19 DIAGNOSIS — I770 Arteriovenous fistula, acquired: Secondary | ICD-10-CM | POA: Diagnosis not present

## 2019-03-20 DIAGNOSIS — N186 End stage renal disease: Secondary | ICD-10-CM | POA: Diagnosis not present

## 2019-03-20 DIAGNOSIS — Z992 Dependence on renal dialysis: Secondary | ICD-10-CM | POA: Diagnosis not present

## 2019-03-20 DIAGNOSIS — N2581 Secondary hyperparathyroidism of renal origin: Secondary | ICD-10-CM | POA: Diagnosis not present

## 2019-03-22 DIAGNOSIS — N2581 Secondary hyperparathyroidism of renal origin: Secondary | ICD-10-CM | POA: Diagnosis not present

## 2019-03-22 DIAGNOSIS — Z992 Dependence on renal dialysis: Secondary | ICD-10-CM | POA: Diagnosis not present

## 2019-03-22 DIAGNOSIS — N186 End stage renal disease: Secondary | ICD-10-CM | POA: Diagnosis not present

## 2019-03-24 DIAGNOSIS — N186 End stage renal disease: Secondary | ICD-10-CM | POA: Diagnosis not present

## 2019-03-24 DIAGNOSIS — Z992 Dependence on renal dialysis: Secondary | ICD-10-CM | POA: Diagnosis not present

## 2019-03-24 DIAGNOSIS — N2581 Secondary hyperparathyroidism of renal origin: Secondary | ICD-10-CM | POA: Diagnosis not present

## 2019-03-27 DIAGNOSIS — N2581 Secondary hyperparathyroidism of renal origin: Secondary | ICD-10-CM | POA: Diagnosis not present

## 2019-03-27 DIAGNOSIS — N186 End stage renal disease: Secondary | ICD-10-CM | POA: Diagnosis not present

## 2019-03-27 DIAGNOSIS — Z992 Dependence on renal dialysis: Secondary | ICD-10-CM | POA: Diagnosis not present

## 2019-03-29 DIAGNOSIS — N186 End stage renal disease: Secondary | ICD-10-CM | POA: Diagnosis not present

## 2019-03-29 DIAGNOSIS — N2581 Secondary hyperparathyroidism of renal origin: Secondary | ICD-10-CM | POA: Diagnosis not present

## 2019-03-29 DIAGNOSIS — Z992 Dependence on renal dialysis: Secondary | ICD-10-CM | POA: Diagnosis not present

## 2019-03-31 DIAGNOSIS — Z992 Dependence on renal dialysis: Secondary | ICD-10-CM | POA: Diagnosis not present

## 2019-03-31 DIAGNOSIS — N2581 Secondary hyperparathyroidism of renal origin: Secondary | ICD-10-CM | POA: Diagnosis not present

## 2019-03-31 DIAGNOSIS — N186 End stage renal disease: Secondary | ICD-10-CM | POA: Diagnosis not present

## 2019-04-03 DIAGNOSIS — Z992 Dependence on renal dialysis: Secondary | ICD-10-CM | POA: Diagnosis not present

## 2019-04-03 DIAGNOSIS — N2581 Secondary hyperparathyroidism of renal origin: Secondary | ICD-10-CM | POA: Diagnosis not present

## 2019-04-03 DIAGNOSIS — N186 End stage renal disease: Secondary | ICD-10-CM | POA: Diagnosis not present

## 2019-04-04 DIAGNOSIS — E1122 Type 2 diabetes mellitus with diabetic chronic kidney disease: Secondary | ICD-10-CM | POA: Diagnosis not present

## 2019-04-04 DIAGNOSIS — N186 End stage renal disease: Secondary | ICD-10-CM | POA: Diagnosis not present

## 2019-04-04 DIAGNOSIS — Z992 Dependence on renal dialysis: Secondary | ICD-10-CM | POA: Diagnosis not present

## 2019-04-05 DIAGNOSIS — N186 End stage renal disease: Secondary | ICD-10-CM | POA: Diagnosis not present

## 2019-04-05 DIAGNOSIS — Z992 Dependence on renal dialysis: Secondary | ICD-10-CM | POA: Diagnosis not present

## 2019-04-05 DIAGNOSIS — N2581 Secondary hyperparathyroidism of renal origin: Secondary | ICD-10-CM | POA: Diagnosis not present

## 2019-04-07 DIAGNOSIS — N186 End stage renal disease: Secondary | ICD-10-CM | POA: Diagnosis not present

## 2019-04-07 DIAGNOSIS — Z992 Dependence on renal dialysis: Secondary | ICD-10-CM | POA: Diagnosis not present

## 2019-04-07 DIAGNOSIS — N2581 Secondary hyperparathyroidism of renal origin: Secondary | ICD-10-CM | POA: Diagnosis not present

## 2019-04-10 DIAGNOSIS — Z992 Dependence on renal dialysis: Secondary | ICD-10-CM | POA: Diagnosis not present

## 2019-04-10 DIAGNOSIS — N186 End stage renal disease: Secondary | ICD-10-CM | POA: Diagnosis not present

## 2019-04-10 DIAGNOSIS — N2581 Secondary hyperparathyroidism of renal origin: Secondary | ICD-10-CM | POA: Diagnosis not present

## 2019-04-12 DIAGNOSIS — N2581 Secondary hyperparathyroidism of renal origin: Secondary | ICD-10-CM | POA: Diagnosis not present

## 2019-04-12 DIAGNOSIS — Z992 Dependence on renal dialysis: Secondary | ICD-10-CM | POA: Diagnosis not present

## 2019-04-12 DIAGNOSIS — N186 End stage renal disease: Secondary | ICD-10-CM | POA: Diagnosis not present

## 2019-04-14 DIAGNOSIS — N2581 Secondary hyperparathyroidism of renal origin: Secondary | ICD-10-CM | POA: Diagnosis not present

## 2019-04-14 DIAGNOSIS — Z992 Dependence on renal dialysis: Secondary | ICD-10-CM | POA: Diagnosis not present

## 2019-04-14 DIAGNOSIS — N186 End stage renal disease: Secondary | ICD-10-CM | POA: Diagnosis not present

## 2019-04-17 DIAGNOSIS — Z992 Dependence on renal dialysis: Secondary | ICD-10-CM | POA: Diagnosis not present

## 2019-04-17 DIAGNOSIS — N2581 Secondary hyperparathyroidism of renal origin: Secondary | ICD-10-CM | POA: Diagnosis not present

## 2019-04-17 DIAGNOSIS — N186 End stage renal disease: Secondary | ICD-10-CM | POA: Diagnosis not present

## 2019-04-19 DIAGNOSIS — N186 End stage renal disease: Secondary | ICD-10-CM | POA: Diagnosis not present

## 2019-04-19 DIAGNOSIS — Z992 Dependence on renal dialysis: Secondary | ICD-10-CM | POA: Diagnosis not present

## 2019-04-19 DIAGNOSIS — N2581 Secondary hyperparathyroidism of renal origin: Secondary | ICD-10-CM | POA: Diagnosis not present

## 2019-04-21 DIAGNOSIS — N186 End stage renal disease: Secondary | ICD-10-CM | POA: Diagnosis not present

## 2019-04-21 DIAGNOSIS — Z992 Dependence on renal dialysis: Secondary | ICD-10-CM | POA: Diagnosis not present

## 2019-04-21 DIAGNOSIS — N2581 Secondary hyperparathyroidism of renal origin: Secondary | ICD-10-CM | POA: Diagnosis not present

## 2019-04-24 DIAGNOSIS — N186 End stage renal disease: Secondary | ICD-10-CM | POA: Diagnosis not present

## 2019-04-24 DIAGNOSIS — Z992 Dependence on renal dialysis: Secondary | ICD-10-CM | POA: Diagnosis not present

## 2019-04-24 DIAGNOSIS — N2581 Secondary hyperparathyroidism of renal origin: Secondary | ICD-10-CM | POA: Diagnosis not present

## 2019-04-26 DIAGNOSIS — Z992 Dependence on renal dialysis: Secondary | ICD-10-CM | POA: Diagnosis not present

## 2019-04-26 DIAGNOSIS — N186 End stage renal disease: Secondary | ICD-10-CM | POA: Diagnosis not present

## 2019-04-26 DIAGNOSIS — N2581 Secondary hyperparathyroidism of renal origin: Secondary | ICD-10-CM | POA: Diagnosis not present

## 2019-04-28 DIAGNOSIS — Z992 Dependence on renal dialysis: Secondary | ICD-10-CM | POA: Diagnosis not present

## 2019-04-28 DIAGNOSIS — N2581 Secondary hyperparathyroidism of renal origin: Secondary | ICD-10-CM | POA: Diagnosis not present

## 2019-04-28 DIAGNOSIS — N186 End stage renal disease: Secondary | ICD-10-CM | POA: Diagnosis not present

## 2019-04-30 ENCOUNTER — Other Ambulatory Visit: Payer: Self-pay | Admitting: Cardiovascular Disease

## 2019-04-30 MED ORDER — CLOPIDOGREL BISULFATE 75 MG PO TABS: 75.0000 mg | ORAL_TABLET | Freq: Every day | ORAL | 0 refills | Status: DC

## 2019-04-30 NOTE — Addendum Note (Signed)
Addended by: Raelene Bott, BRANDY L on: 04/30/2019 02:40 PM   Modules accepted: Orders

## 2019-04-30 NOTE — Telephone Encounter (Signed)
Requested Prescriptions   Signed Prescriptions Disp Refills  . clopidogrel (PLAVIX) 75 MG tablet 90 tablet 0    Sig: Take 1 tablet (75 mg total) by mouth daily.    Authorizing Provider: Lorretta Harp    Ordering User: Raelene Bott, BRANDY L

## 2019-05-01 DIAGNOSIS — N186 End stage renal disease: Secondary | ICD-10-CM | POA: Diagnosis not present

## 2019-05-01 DIAGNOSIS — N2581 Secondary hyperparathyroidism of renal origin: Secondary | ICD-10-CM | POA: Diagnosis not present

## 2019-05-01 DIAGNOSIS — Z992 Dependence on renal dialysis: Secondary | ICD-10-CM | POA: Diagnosis not present

## 2019-05-03 DIAGNOSIS — N186 End stage renal disease: Secondary | ICD-10-CM | POA: Diagnosis not present

## 2019-05-03 DIAGNOSIS — N2581 Secondary hyperparathyroidism of renal origin: Secondary | ICD-10-CM | POA: Diagnosis not present

## 2019-05-03 DIAGNOSIS — Z992 Dependence on renal dialysis: Secondary | ICD-10-CM | POA: Diagnosis not present

## 2019-05-05 DIAGNOSIS — N2581 Secondary hyperparathyroidism of renal origin: Secondary | ICD-10-CM | POA: Diagnosis not present

## 2019-05-05 DIAGNOSIS — E1122 Type 2 diabetes mellitus with diabetic chronic kidney disease: Secondary | ICD-10-CM | POA: Diagnosis not present

## 2019-05-05 DIAGNOSIS — Z992 Dependence on renal dialysis: Secondary | ICD-10-CM | POA: Diagnosis not present

## 2019-05-05 DIAGNOSIS — N186 End stage renal disease: Secondary | ICD-10-CM | POA: Diagnosis not present

## 2019-05-08 DIAGNOSIS — Z992 Dependence on renal dialysis: Secondary | ICD-10-CM | POA: Diagnosis not present

## 2019-05-08 DIAGNOSIS — N2581 Secondary hyperparathyroidism of renal origin: Secondary | ICD-10-CM | POA: Diagnosis not present

## 2019-05-08 DIAGNOSIS — N186 End stage renal disease: Secondary | ICD-10-CM | POA: Diagnosis not present

## 2019-05-10 DIAGNOSIS — N186 End stage renal disease: Secondary | ICD-10-CM | POA: Diagnosis not present

## 2019-05-10 DIAGNOSIS — N2581 Secondary hyperparathyroidism of renal origin: Secondary | ICD-10-CM | POA: Diagnosis not present

## 2019-05-10 DIAGNOSIS — Z992 Dependence on renal dialysis: Secondary | ICD-10-CM | POA: Diagnosis not present

## 2019-05-11 ENCOUNTER — Other Ambulatory Visit: Payer: Self-pay

## 2019-05-11 DIAGNOSIS — N186 End stage renal disease: Secondary | ICD-10-CM

## 2019-05-12 DIAGNOSIS — Z992 Dependence on renal dialysis: Secondary | ICD-10-CM | POA: Diagnosis not present

## 2019-05-12 DIAGNOSIS — N186 End stage renal disease: Secondary | ICD-10-CM | POA: Diagnosis not present

## 2019-05-12 DIAGNOSIS — N2581 Secondary hyperparathyroidism of renal origin: Secondary | ICD-10-CM | POA: Diagnosis not present

## 2019-05-15 DIAGNOSIS — Z992 Dependence on renal dialysis: Secondary | ICD-10-CM | POA: Diagnosis not present

## 2019-05-15 DIAGNOSIS — N186 End stage renal disease: Secondary | ICD-10-CM | POA: Diagnosis not present

## 2019-05-15 DIAGNOSIS — N2581 Secondary hyperparathyroidism of renal origin: Secondary | ICD-10-CM | POA: Diagnosis not present

## 2019-05-17 DIAGNOSIS — N2581 Secondary hyperparathyroidism of renal origin: Secondary | ICD-10-CM | POA: Diagnosis not present

## 2019-05-17 DIAGNOSIS — Z992 Dependence on renal dialysis: Secondary | ICD-10-CM | POA: Diagnosis not present

## 2019-05-17 DIAGNOSIS — N186 End stage renal disease: Secondary | ICD-10-CM | POA: Diagnosis not present

## 2019-05-18 ENCOUNTER — Ambulatory Visit: Payer: Medicare HMO | Admitting: Vascular Surgery

## 2019-05-18 ENCOUNTER — Other Ambulatory Visit (HOSPITAL_COMMUNITY): Payer: Medicare HMO

## 2019-05-19 DIAGNOSIS — N2581 Secondary hyperparathyroidism of renal origin: Secondary | ICD-10-CM | POA: Diagnosis not present

## 2019-05-19 DIAGNOSIS — N186 End stage renal disease: Secondary | ICD-10-CM | POA: Diagnosis not present

## 2019-05-19 DIAGNOSIS — Z992 Dependence on renal dialysis: Secondary | ICD-10-CM | POA: Diagnosis not present

## 2019-05-22 DIAGNOSIS — N2581 Secondary hyperparathyroidism of renal origin: Secondary | ICD-10-CM | POA: Diagnosis not present

## 2019-05-22 DIAGNOSIS — N186 End stage renal disease: Secondary | ICD-10-CM | POA: Diagnosis not present

## 2019-05-22 DIAGNOSIS — Z992 Dependence on renal dialysis: Secondary | ICD-10-CM | POA: Diagnosis not present

## 2019-05-24 DIAGNOSIS — Z992 Dependence on renal dialysis: Secondary | ICD-10-CM | POA: Diagnosis not present

## 2019-05-24 DIAGNOSIS — N2581 Secondary hyperparathyroidism of renal origin: Secondary | ICD-10-CM | POA: Diagnosis not present

## 2019-05-24 DIAGNOSIS — N186 End stage renal disease: Secondary | ICD-10-CM | POA: Diagnosis not present

## 2019-05-26 DIAGNOSIS — N186 End stage renal disease: Secondary | ICD-10-CM | POA: Diagnosis not present

## 2019-05-26 DIAGNOSIS — Z992 Dependence on renal dialysis: Secondary | ICD-10-CM | POA: Diagnosis not present

## 2019-05-26 DIAGNOSIS — N2581 Secondary hyperparathyroidism of renal origin: Secondary | ICD-10-CM | POA: Diagnosis not present

## 2019-05-28 DIAGNOSIS — Z992 Dependence on renal dialysis: Secondary | ICD-10-CM | POA: Diagnosis not present

## 2019-05-28 DIAGNOSIS — N186 End stage renal disease: Secondary | ICD-10-CM | POA: Diagnosis not present

## 2019-05-28 DIAGNOSIS — N2581 Secondary hyperparathyroidism of renal origin: Secondary | ICD-10-CM | POA: Diagnosis not present

## 2019-05-30 DIAGNOSIS — N186 End stage renal disease: Secondary | ICD-10-CM | POA: Diagnosis not present

## 2019-05-30 DIAGNOSIS — Z992 Dependence on renal dialysis: Secondary | ICD-10-CM | POA: Diagnosis not present

## 2019-05-30 DIAGNOSIS — N2581 Secondary hyperparathyroidism of renal origin: Secondary | ICD-10-CM | POA: Diagnosis not present

## 2019-06-02 DIAGNOSIS — N186 End stage renal disease: Secondary | ICD-10-CM | POA: Diagnosis not present

## 2019-06-02 DIAGNOSIS — Z992 Dependence on renal dialysis: Secondary | ICD-10-CM | POA: Diagnosis not present

## 2019-06-02 DIAGNOSIS — N2581 Secondary hyperparathyroidism of renal origin: Secondary | ICD-10-CM | POA: Diagnosis not present

## 2019-06-04 DIAGNOSIS — Z992 Dependence on renal dialysis: Secondary | ICD-10-CM | POA: Diagnosis not present

## 2019-06-04 DIAGNOSIS — N186 End stage renal disease: Secondary | ICD-10-CM | POA: Diagnosis not present

## 2019-06-04 DIAGNOSIS — E1122 Type 2 diabetes mellitus with diabetic chronic kidney disease: Secondary | ICD-10-CM | POA: Diagnosis not present

## 2019-06-22 ENCOUNTER — Other Ambulatory Visit: Payer: Self-pay

## 2019-06-22 ENCOUNTER — Ambulatory Visit (HOSPITAL_COMMUNITY)
Admission: RE | Admit: 2019-06-22 | Discharge: 2019-06-22 | Disposition: A | Discharge status: Home / Self Care | Payer: Medicare HMO | Source: Ambulatory Visit | Attending: Vascular Surgery | Admitting: Vascular Surgery

## 2019-06-22 ENCOUNTER — Encounter: Payer: Self-pay | Admitting: Vascular Surgery

## 2019-06-22 ENCOUNTER — Ambulatory Visit (INDEPENDENT_AMBULATORY_CARE_PROVIDER_SITE_OTHER): Payer: Medicare HMO | Admitting: Vascular Surgery

## 2019-06-22 VITALS — BP 110/69 | HR 64 | Temp 97.2°F | Resp 20 | Ht 64.0 in | Wt 171.2 lb

## 2019-06-22 DIAGNOSIS — N186 End stage renal disease: Secondary | ICD-10-CM | POA: Insufficient documentation

## 2019-06-22 NOTE — Progress Notes (Signed)
Patient ID: Joanne Carlson, female   DOB: 1945/07/12, 73 y.o.   MRN: [id number]  Reason for Consult: New Patient (Initial Visit)   Referred by Vernie Shanks, MD  Subjective:     HPI:  Joanne Carlson is a 73 y.o. female dialysis via right upper arm AV fistula.  She has had some numbness issues and pain with cannulation.  Previously did have an infiltration event.  States that this is better at this point.  She does does not have any issues with her hand mobility but does have some numbness in her distal fingertips.  She has no wounds in her right upper extremity right hand.  Past Medical History:  Diagnosis Date  . Arthritis    "legs, knees; not bad" (01/30/2018)  . Dizziness    occasionally  . ESRD (end stage renal disease) ([place])    [name]; TTS" (01/30/2018)  . History of colon polyps   . History of gout    takes Allopurinol daily (01/30/2018)  . Hyperlipidemia    takes Pravastatin daily  . Hypertension    takes Monopril daily  . Joint pain   . PAD (peripheral artery disease) ([place])   . Peripheral neuropathy   . Peripheral vascular disease ([place])   . Pneumonia    "once; years ago" (01/30/2018)  . Refusal of blood transfusions as patient is Jehovah's Witness    NO BLOOD OR BLOOD PRODUCTS. Albumin okay.   . Stroke ([place]) 1990s   "mini stroke; left lower mouth a little bit twisted since" (01/30/2018)  . Type II diabetes mellitus ([facility])    diet controlled-   Family History  Problem Relation Age of Onset  . Depression Mother   . Hypertension Mother   . Depression Father   . Depression Sister   . Hypertension Sister    Past Surgical History:  Procedure Laterality Date  . ABDOMINAL AORTOGRAM W/LOWER EXTREMITY N/A 01/30/2018   Procedure: ABDOMINAL AORTOGRAM W/LOWER EXTREMITY;  Surgeon: Lorretta Harp, MD;  Location: [place] [facility];  Service: Cardiovascular;  Laterality: N/A;  . APPENDECTOMY    . BASCILIC VEIN TRANSPOSITION Right 10/03/2013   Procedure:  BRACHIOCEPHALIC Arteriovenous Fistula ;  Surgeon: Mal Misty, MD;  Location: [place];  Service: Vascular;  Laterality: Right;  . BASCILIC VEIN TRANSPOSITION Right 11/17/2016   Procedure: BASCILIC VEIN TRANSPOSITION- RIGHT SINGLE STAGE;  Surgeon: Conrad Edmonds, MD;  Location: [place];  Service: Vascular;  Laterality: Right;  . COLONOSCOPY    . CYST EXCISION  1980's   cyst removed from lower abdomen  . DILATION AND CURETTAGE OF UTERUS    . ESOPHAGOGASTRODUODENOSCOPY    . PERIPHERAL VASCULAR BALLOON ANGIOPLASTY Right 01/30/2018   Procedure: PERIPHERAL VASCULAR BALLOON ANGIOPLASTY;  Surgeon: Lorretta Harp, MD;  Location: [place] [facility];  Service: Cardiovascular;  Laterality: Right;  superficial femoral  . SHOULDER ARTHROSCOPY W/ ROTATOR CUFF REPAIR Right 2013  . TUBAL LIGATION    . UPPER EXTREMITY VENOGRAPHY Bilateral 11/08/2016   Procedure: Bilateral Upper Extremity Venography;  Surgeon: Conrad Porterville, MD;  Location: [place] [facility];  Service: Cardiovascular;  Laterality: Bilateral;    Short Social History:  Social History   Tobacco Use  . Smoking status: Former Smoker    Years: 10.00    Types: Cigarettes    Quit date: 1970    Years since quitting: 50.9  . Smokeless tobacco: Never Used  . Tobacco comment: "never a heavy smoker; smoked off and  on"  Substance Use Topics  . Alcohol use: Not Currently    Comment: "in my younger days"    Allergies  Allergen Reactions  . Tylenol [Acetaminophen] Rash    Current Outpatient Medications  Medication Sig Dispense Refill  . allopurinol (ZYLOPRIM) 100 MG tablet Take 200 mg by mouth daily.     Marland Kitchen aspirin EC 81 MG tablet Take 81 mg by mouth daily.    Marland Kitchen atorvastatin (LIPITOR) 80 MG tablet TAKE 1 TABLET BY MOUTH ONCE DAILY AT  6  PM 90 tablet 2  . b complex-vitamin c-folic acid (NEPHRO-VITE) 0.8 MG TABS tablet Take 1 tablet by mouth daily.    . Blood Glucose Monitoring Suppl (ACCU-CHEK AVIVA PLUS) w/Device KIT   0  . calcium acetate  (PHOSLO) 667 MG capsule Take 667-1,334 mg by mouth See admin instructions. Take 2 caps three times a day with meals. Take 1 cap with snacks twice daily.    . cinacalcet (SENSIPAR) 60 MG tablet Take 60 mg by mouth daily.     . clopidogrel (PLAVIX) 75 MG tablet Take 1 tablet (75 mg total) by mouth daily. 90 tablet 0  . gabapentin (NEURONTIN) 100 MG capsule Take 1 capsule by mouth every morning and 1 capsule at bedtime. PATIENT NEEDS OFFICE VISIT FOR ADDITIONAL REFILLS (Patient taking differently: Take 100 mg by mouth daily as needed (pain). ) 60 capsule 0  . glucose blood test strip Use to test blood sugar daily. Dx code: 250.02. 100 each 3  . Lancets MISC Use to test blood sugar daily. Dx code 250.02. 100 each 3  . lidocaine-prilocaine (EMLA) cream Apply 1 application topically 3 (three) times a week. Tuesday, Thursday and Saturday  12  . midodrine (PROAMATINE) 5 MG tablet Take 5 mg by mouth 2 (two) times daily.     No current facility-administered medications for this visit.    Review of Systems  Constitutional:  Constitutional negative. HENT: HENT negative.  Eyes: Eyes negative.  Respiratory: Respiratory negative.  Cardiovascular: Cardiovascular negative.  GI: Gastrointestinal negative.  Musculoskeletal:       Arm pain Skin: Skin negative.  Neurological: Positive for numbness.  Hematologic: Hematologic/lymphatic negative.  Psychiatric: Psychiatric negative.        Objective:  Objective   Vitals:   06/22/19 0940  BP: 110/69  Pulse: 64  Resp: 20  Temp: (!) 97.2 F (36.2 C)  SpO2: 100%  Weight: 171 lb 3.2 oz (77.7 kg)  Height: 5' 4"  (1.626 m)   Body mass index is 29.39 kg/m.  Physical Exam HENT:     Head: Normocephalic.     Nose: Nose normal.     Mouth/Throat:     Mouth: Mucous membranes are moist.  Eyes:     Pupils: Pupils are equal, round, and reactive to light.  Cardiovascular:     Rate and Rhythm: Normal rate.     Comments: I can palpate her radial pulse with  compression of the fistula. Pulmonary:     Effort: Pulmonary effort is normal.  Abdominal:     General: Abdomen is flat.     Palpations: Abdomen is soft.  Musculoskeletal:        General: Normal range of motion.     Cervical back: Normal range of motion and neck supple.     Comments: Right upper arm with thrill near the antecubitum above that become somewhat pulsatile.  Skin:    General: Skin is warm.     Capillary Refill: Capillary refill takes  less than 2 seconds.  Neurological:     General: No focal deficit present.     Mental Status: She is alert.  Psychiatric:        Mood and Affect: Mood normal.        Behavior: Behavior normal.        Thought Content: Thought content normal.        Judgment: Judgment normal.     Data: I have independently interpreted her right upper extremity duplex right radial artery is 132 mmHg with compression in 115 without.   The fistula has elevated velocity of 505 cm/s just after the anastomosis.      Assessment/Plan:     73 year old female on dialysis via right arm fistula.  I have offered her right upper extremity fistulogram but at this time she would rather not have any procedures.  I have discussed with her that she can call me on an as-needed basis and we can perform fistulogram as long as in the next 2 to 3 months without her being seen again.  She demonstrates good understanding.     Waynetta Sandy MD [facility]

## 2019-07-30 ENCOUNTER — Other Ambulatory Visit: Payer: Self-pay

## 2019-07-30 MED ORDER — ATORVASTATIN CALCIUM 80 MG PO TABS: ORAL_TABLET | ORAL | 2 refills | Status: DC

## 2019-08-07 ENCOUNTER — Other Ambulatory Visit: Payer: Self-pay | Admitting: Family Medicine

## 2019-08-07 DIAGNOSIS — Z1231 Encounter for screening mammogram for malignant neoplasm of breast: Secondary | ICD-10-CM

## 2019-08-08 ENCOUNTER — Other Ambulatory Visit: Payer: Self-pay | Admitting: Cardiovascular Disease

## 2019-08-08 MED ORDER — CLOPIDOGREL BISULFATE 75 MG PO TABS: 75.0000 mg | ORAL_TABLET | Freq: Every day | ORAL | 1 refills | Status: DC

## 2019-08-08 NOTE — Telephone Encounter (Signed)
°*  STAT* If patient is at the pharmacy, call can be transferred to refill team.   1. Which medications need to be refilled? (please list name of each medication and dose if known)  Clopidogrel 75 mg  2. Which pharmacy/location (including street and city if local pharmacy) is medication to be sent to? [place]  3. Do they need a 30 day or 90 day supply? [place]

## 2019-09-06 ENCOUNTER — Other Ambulatory Visit: Payer: Self-pay

## 2019-09-06 ENCOUNTER — Ambulatory Visit
Admission: RE | Admit: 2019-09-06 | Discharge: 2019-09-06 | Disposition: A | Discharge status: Home / Self Care | Payer: Medicare HMO | Source: Ambulatory Visit | Attending: Family Medicine | Admitting: Family Medicine

## 2019-09-06 DIAGNOSIS — Z1231 Encounter for screening mammogram for malignant neoplasm of breast: Secondary | ICD-10-CM

## 2019-10-26 ENCOUNTER — Other Ambulatory Visit: Payer: Self-pay | Admitting: Family Medicine

## 2019-10-26 DIAGNOSIS — M810 Age-related osteoporosis without current pathological fracture: Secondary | ICD-10-CM

## 2019-11-03 ENCOUNTER — Other Ambulatory Visit: Payer: Self-pay | Admitting: Cardiovascular Disease

## 2019-11-03 DIAGNOSIS — N186 End stage renal disease: Secondary | ICD-10-CM | POA: Diagnosis not present

## 2019-11-03 DIAGNOSIS — Z992 Dependence on renal dialysis: Secondary | ICD-10-CM | POA: Diagnosis not present

## 2019-11-03 DIAGNOSIS — N2581 Secondary hyperparathyroidism of renal origin: Secondary | ICD-10-CM | POA: Diagnosis not present

## 2019-11-05 NOTE — Telephone Encounter (Signed)
Rx(s) sent to pharmacy electronically.  

## 2019-11-06 DIAGNOSIS — N2581 Secondary hyperparathyroidism of renal origin: Secondary | ICD-10-CM | POA: Diagnosis not present

## 2019-11-06 DIAGNOSIS — Z992 Dependence on renal dialysis: Secondary | ICD-10-CM | POA: Diagnosis not present

## 2019-11-06 DIAGNOSIS — N186 End stage renal disease: Secondary | ICD-10-CM | POA: Diagnosis not present

## 2019-11-08 DIAGNOSIS — Z992 Dependence on renal dialysis: Secondary | ICD-10-CM | POA: Diagnosis not present

## 2019-11-08 DIAGNOSIS — N2581 Secondary hyperparathyroidism of renal origin: Secondary | ICD-10-CM | POA: Diagnosis not present

## 2019-11-08 DIAGNOSIS — N186 End stage renal disease: Secondary | ICD-10-CM | POA: Diagnosis not present

## 2019-11-10 DIAGNOSIS — Z992 Dependence on renal dialysis: Secondary | ICD-10-CM | POA: Diagnosis not present

## 2019-11-10 DIAGNOSIS — N186 End stage renal disease: Secondary | ICD-10-CM | POA: Diagnosis not present

## 2019-11-10 DIAGNOSIS — N2581 Secondary hyperparathyroidism of renal origin: Secondary | ICD-10-CM | POA: Diagnosis not present

## 2019-11-13 DIAGNOSIS — Z992 Dependence on renal dialysis: Secondary | ICD-10-CM | POA: Diagnosis not present

## 2019-11-13 DIAGNOSIS — N186 End stage renal disease: Secondary | ICD-10-CM | POA: Diagnosis not present

## 2019-11-13 DIAGNOSIS — N2581 Secondary hyperparathyroidism of renal origin: Secondary | ICD-10-CM | POA: Diagnosis not present

## 2019-11-15 DIAGNOSIS — Z992 Dependence on renal dialysis: Secondary | ICD-10-CM | POA: Diagnosis not present

## 2019-11-15 DIAGNOSIS — N186 End stage renal disease: Secondary | ICD-10-CM | POA: Diagnosis not present

## 2019-11-15 DIAGNOSIS — N2581 Secondary hyperparathyroidism of renal origin: Secondary | ICD-10-CM | POA: Diagnosis not present

## 2019-11-17 DIAGNOSIS — N186 End stage renal disease: Secondary | ICD-10-CM | POA: Diagnosis not present

## 2019-11-17 DIAGNOSIS — N2581 Secondary hyperparathyroidism of renal origin: Secondary | ICD-10-CM | POA: Diagnosis not present

## 2019-11-17 DIAGNOSIS — Z992 Dependence on renal dialysis: Secondary | ICD-10-CM | POA: Diagnosis not present

## 2019-11-20 DIAGNOSIS — N2581 Secondary hyperparathyroidism of renal origin: Secondary | ICD-10-CM | POA: Diagnosis not present

## 2019-11-20 DIAGNOSIS — M25561 Pain in right knee: Secondary | ICD-10-CM | POA: Diagnosis not present

## 2019-11-20 DIAGNOSIS — Z992 Dependence on renal dialysis: Secondary | ICD-10-CM | POA: Diagnosis not present

## 2019-11-20 DIAGNOSIS — M79604 Pain in right leg: Secondary | ICD-10-CM | POA: Diagnosis not present

## 2019-11-20 DIAGNOSIS — M7061 Trochanteric bursitis, right hip: Secondary | ICD-10-CM | POA: Diagnosis not present

## 2019-11-20 DIAGNOSIS — N186 End stage renal disease: Secondary | ICD-10-CM | POA: Diagnosis not present

## 2019-11-22 DIAGNOSIS — Z992 Dependence on renal dialysis: Secondary | ICD-10-CM | POA: Diagnosis not present

## 2019-11-22 DIAGNOSIS — N2581 Secondary hyperparathyroidism of renal origin: Secondary | ICD-10-CM | POA: Diagnosis not present

## 2019-11-22 DIAGNOSIS — N186 End stage renal disease: Secondary | ICD-10-CM | POA: Diagnosis not present

## 2019-11-24 DIAGNOSIS — N186 End stage renal disease: Secondary | ICD-10-CM | POA: Diagnosis not present

## 2019-11-24 DIAGNOSIS — Z992 Dependence on renal dialysis: Secondary | ICD-10-CM | POA: Diagnosis not present

## 2019-11-24 DIAGNOSIS — N2581 Secondary hyperparathyroidism of renal origin: Secondary | ICD-10-CM | POA: Diagnosis not present

## 2019-11-27 DIAGNOSIS — N2581 Secondary hyperparathyroidism of renal origin: Secondary | ICD-10-CM | POA: Diagnosis not present

## 2019-11-27 DIAGNOSIS — N186 End stage renal disease: Secondary | ICD-10-CM | POA: Diagnosis not present

## 2019-11-27 DIAGNOSIS — Z992 Dependence on renal dialysis: Secondary | ICD-10-CM | POA: Diagnosis not present

## 2019-11-28 DIAGNOSIS — M25561 Pain in right knee: Secondary | ICD-10-CM | POA: Diagnosis not present

## 2019-11-28 DIAGNOSIS — R262 Difficulty in walking, not elsewhere classified: Secondary | ICD-10-CM | POA: Diagnosis not present

## 2019-11-28 DIAGNOSIS — M79604 Pain in right leg: Secondary | ICD-10-CM | POA: Diagnosis not present

## 2019-11-28 DIAGNOSIS — M6281 Muscle weakness (generalized): Secondary | ICD-10-CM | POA: Diagnosis not present

## 2019-11-29 DIAGNOSIS — N2581 Secondary hyperparathyroidism of renal origin: Secondary | ICD-10-CM | POA: Diagnosis not present

## 2019-11-29 DIAGNOSIS — N186 End stage renal disease: Secondary | ICD-10-CM | POA: Diagnosis not present

## 2019-11-29 DIAGNOSIS — Z992 Dependence on renal dialysis: Secondary | ICD-10-CM | POA: Diagnosis not present

## 2019-12-01 DIAGNOSIS — Z992 Dependence on renal dialysis: Secondary | ICD-10-CM | POA: Diagnosis not present

## 2019-12-01 DIAGNOSIS — N2581 Secondary hyperparathyroidism of renal origin: Secondary | ICD-10-CM | POA: Diagnosis not present

## 2019-12-01 DIAGNOSIS — N186 End stage renal disease: Secondary | ICD-10-CM | POA: Diagnosis not present

## 2019-12-03 DIAGNOSIS — N186 End stage renal disease: Secondary | ICD-10-CM | POA: Diagnosis not present

## 2019-12-03 DIAGNOSIS — Z992 Dependence on renal dialysis: Secondary | ICD-10-CM | POA: Diagnosis not present

## 2019-12-03 DIAGNOSIS — E1122 Type 2 diabetes mellitus with diabetic chronic kidney disease: Secondary | ICD-10-CM | POA: Diagnosis not present

## 2019-12-04 DIAGNOSIS — N2581 Secondary hyperparathyroidism of renal origin: Secondary | ICD-10-CM | POA: Diagnosis not present

## 2019-12-04 DIAGNOSIS — N186 End stage renal disease: Secondary | ICD-10-CM | POA: Diagnosis not present

## 2019-12-04 DIAGNOSIS — Z992 Dependence on renal dialysis: Secondary | ICD-10-CM | POA: Diagnosis not present

## 2019-12-05 DIAGNOSIS — M25561 Pain in right knee: Secondary | ICD-10-CM | POA: Diagnosis not present

## 2019-12-05 DIAGNOSIS — Z992 Dependence on renal dialysis: Secondary | ICD-10-CM | POA: Diagnosis not present

## 2019-12-05 DIAGNOSIS — E785 Hyperlipidemia, unspecified: Secondary | ICD-10-CM | POA: Diagnosis not present

## 2019-12-05 DIAGNOSIS — N186 End stage renal disease: Secondary | ICD-10-CM | POA: Diagnosis not present

## 2019-12-05 DIAGNOSIS — R262 Difficulty in walking, not elsewhere classified: Secondary | ICD-10-CM | POA: Diagnosis not present

## 2019-12-05 DIAGNOSIS — M79604 Pain in right leg: Secondary | ICD-10-CM | POA: Diagnosis not present

## 2019-12-05 DIAGNOSIS — D631 Anemia in chronic kidney disease: Secondary | ICD-10-CM | POA: Diagnosis not present

## 2019-12-05 DIAGNOSIS — E1142 Type 2 diabetes mellitus with diabetic polyneuropathy: Secondary | ICD-10-CM | POA: Diagnosis not present

## 2019-12-05 DIAGNOSIS — I2510 Atherosclerotic heart disease of native coronary artery without angina pectoris: Secondary | ICD-10-CM | POA: Diagnosis not present

## 2019-12-05 DIAGNOSIS — E1151 Type 2 diabetes mellitus with diabetic peripheral angiopathy without gangrene: Secondary | ICD-10-CM | POA: Diagnosis not present

## 2019-12-05 DIAGNOSIS — M6281 Muscle weakness (generalized): Secondary | ICD-10-CM | POA: Diagnosis not present

## 2019-12-05 DIAGNOSIS — M545 Low back pain: Secondary | ICD-10-CM | POA: Diagnosis not present

## 2019-12-05 DIAGNOSIS — M109 Gout, unspecified: Secondary | ICD-10-CM | POA: Diagnosis not present

## 2019-12-05 DIAGNOSIS — E669 Obesity, unspecified: Secondary | ICD-10-CM | POA: Diagnosis not present

## 2019-12-06 DIAGNOSIS — N2581 Secondary hyperparathyroidism of renal origin: Secondary | ICD-10-CM | POA: Diagnosis not present

## 2019-12-06 DIAGNOSIS — N186 End stage renal disease: Secondary | ICD-10-CM | POA: Diagnosis not present

## 2019-12-06 DIAGNOSIS — Z992 Dependence on renal dialysis: Secondary | ICD-10-CM | POA: Diagnosis not present

## 2019-12-07 DIAGNOSIS — M79604 Pain in right leg: Secondary | ICD-10-CM | POA: Diagnosis not present

## 2019-12-07 DIAGNOSIS — R262 Difficulty in walking, not elsewhere classified: Secondary | ICD-10-CM | POA: Diagnosis not present

## 2019-12-07 DIAGNOSIS — M25561 Pain in right knee: Secondary | ICD-10-CM | POA: Diagnosis not present

## 2019-12-07 DIAGNOSIS — M6281 Muscle weakness (generalized): Secondary | ICD-10-CM | POA: Diagnosis not present

## 2019-12-08 DIAGNOSIS — Z992 Dependence on renal dialysis: Secondary | ICD-10-CM | POA: Diagnosis not present

## 2019-12-08 DIAGNOSIS — N2581 Secondary hyperparathyroidism of renal origin: Secondary | ICD-10-CM | POA: Diagnosis not present

## 2019-12-08 DIAGNOSIS — N186 End stage renal disease: Secondary | ICD-10-CM | POA: Diagnosis not present

## 2019-12-10 DIAGNOSIS — M25561 Pain in right knee: Secondary | ICD-10-CM | POA: Diagnosis not present

## 2019-12-10 DIAGNOSIS — R262 Difficulty in walking, not elsewhere classified: Secondary | ICD-10-CM | POA: Diagnosis not present

## 2019-12-10 DIAGNOSIS — M6281 Muscle weakness (generalized): Secondary | ICD-10-CM | POA: Diagnosis not present

## 2019-12-10 DIAGNOSIS — M79604 Pain in right leg: Secondary | ICD-10-CM | POA: Diagnosis not present

## 2019-12-11 DIAGNOSIS — Z992 Dependence on renal dialysis: Secondary | ICD-10-CM | POA: Diagnosis not present

## 2019-12-11 DIAGNOSIS — N2581 Secondary hyperparathyroidism of renal origin: Secondary | ICD-10-CM | POA: Diagnosis not present

## 2019-12-11 DIAGNOSIS — N186 End stage renal disease: Secondary | ICD-10-CM | POA: Diagnosis not present

## 2019-12-12 ENCOUNTER — Other Ambulatory Visit (HOSPITAL_COMMUNITY): Payer: Self-pay | Admitting: Cardiovascular Disease

## 2019-12-12 ENCOUNTER — Other Ambulatory Visit: Payer: Self-pay

## 2019-12-12 ENCOUNTER — Ambulatory Visit (HOSPITAL_COMMUNITY)
Admission: RE | Admit: 2019-12-12 | Discharge: 2019-12-12 | Disposition: A | Discharge status: Home / Self Care | Payer: Medicare HMO | Source: Ambulatory Visit | Attending: Internal Medicine | Admitting: Internal Medicine

## 2019-12-12 DIAGNOSIS — I739 Peripheral vascular disease, unspecified: Secondary | ICD-10-CM | POA: Diagnosis not present

## 2019-12-12 DIAGNOSIS — M25561 Pain in right knee: Secondary | ICD-10-CM | POA: Diagnosis not present

## 2019-12-12 DIAGNOSIS — R262 Difficulty in walking, not elsewhere classified: Secondary | ICD-10-CM | POA: Diagnosis not present

## 2019-12-12 DIAGNOSIS — M6281 Muscle weakness (generalized): Secondary | ICD-10-CM | POA: Diagnosis not present

## 2019-12-12 DIAGNOSIS — M79604 Pain in right leg: Secondary | ICD-10-CM | POA: Diagnosis not present

## 2019-12-13 DIAGNOSIS — Z992 Dependence on renal dialysis: Secondary | ICD-10-CM | POA: Diagnosis not present

## 2019-12-13 DIAGNOSIS — N2581 Secondary hyperparathyroidism of renal origin: Secondary | ICD-10-CM | POA: Diagnosis not present

## 2019-12-13 DIAGNOSIS — N186 End stage renal disease: Secondary | ICD-10-CM | POA: Diagnosis not present

## 2019-12-15 DIAGNOSIS — N2581 Secondary hyperparathyroidism of renal origin: Secondary | ICD-10-CM | POA: Diagnosis not present

## 2019-12-15 DIAGNOSIS — Z992 Dependence on renal dialysis: Secondary | ICD-10-CM | POA: Diagnosis not present

## 2019-12-15 DIAGNOSIS — N186 End stage renal disease: Secondary | ICD-10-CM | POA: Diagnosis not present

## 2019-12-17 ENCOUNTER — Other Ambulatory Visit: Payer: Self-pay

## 2019-12-17 DIAGNOSIS — R262 Difficulty in walking, not elsewhere classified: Secondary | ICD-10-CM | POA: Diagnosis not present

## 2019-12-17 DIAGNOSIS — M25561 Pain in right knee: Secondary | ICD-10-CM | POA: Diagnosis not present

## 2019-12-17 DIAGNOSIS — M6281 Muscle weakness (generalized): Secondary | ICD-10-CM | POA: Diagnosis not present

## 2019-12-17 DIAGNOSIS — M79604 Pain in right leg: Secondary | ICD-10-CM | POA: Diagnosis not present

## 2019-12-18 DIAGNOSIS — N186 End stage renal disease: Secondary | ICD-10-CM | POA: Diagnosis not present

## 2019-12-18 DIAGNOSIS — Z992 Dependence on renal dialysis: Secondary | ICD-10-CM | POA: Diagnosis not present

## 2019-12-18 DIAGNOSIS — N2581 Secondary hyperparathyroidism of renal origin: Secondary | ICD-10-CM | POA: Diagnosis not present

## 2019-12-19 DIAGNOSIS — R262 Difficulty in walking, not elsewhere classified: Secondary | ICD-10-CM | POA: Diagnosis not present

## 2019-12-19 DIAGNOSIS — M6281 Muscle weakness (generalized): Secondary | ICD-10-CM | POA: Diagnosis not present

## 2019-12-19 DIAGNOSIS — M79604 Pain in right leg: Secondary | ICD-10-CM | POA: Diagnosis not present

## 2019-12-19 DIAGNOSIS — M25561 Pain in right knee: Secondary | ICD-10-CM | POA: Diagnosis not present

## 2019-12-20 DIAGNOSIS — N186 End stage renal disease: Secondary | ICD-10-CM | POA: Diagnosis not present

## 2019-12-20 DIAGNOSIS — N2581 Secondary hyperparathyroidism of renal origin: Secondary | ICD-10-CM | POA: Diagnosis not present

## 2019-12-20 DIAGNOSIS — Z992 Dependence on renal dialysis: Secondary | ICD-10-CM | POA: Diagnosis not present

## 2019-12-22 DIAGNOSIS — N186 End stage renal disease: Secondary | ICD-10-CM | POA: Diagnosis not present

## 2019-12-22 DIAGNOSIS — N2581 Secondary hyperparathyroidism of renal origin: Secondary | ICD-10-CM | POA: Diagnosis not present

## 2019-12-22 DIAGNOSIS — Z992 Dependence on renal dialysis: Secondary | ICD-10-CM | POA: Diagnosis not present

## 2019-12-23 DIAGNOSIS — S6991XA Unspecified injury of right wrist, hand and finger(s), initial encounter: Secondary | ICD-10-CM | POA: Diagnosis not present

## 2019-12-23 DIAGNOSIS — W19XXXA Unspecified fall, initial encounter: Secondary | ICD-10-CM | POA: Diagnosis not present

## 2019-12-23 DIAGNOSIS — S62308A Unspecified fracture of other metacarpal bone, initial encounter for closed fracture: Secondary | ICD-10-CM | POA: Diagnosis not present

## 2019-12-23 DIAGNOSIS — S8991XA Unspecified injury of right lower leg, initial encounter: Secondary | ICD-10-CM | POA: Diagnosis not present

## 2019-12-23 DIAGNOSIS — S80211A Abrasion, right knee, initial encounter: Secondary | ICD-10-CM | POA: Diagnosis not present

## 2019-12-24 DIAGNOSIS — S62316A Displaced fracture of base of fifth metacarpal bone, right hand, initial encounter for closed fracture: Secondary | ICD-10-CM | POA: Diagnosis not present

## 2019-12-24 DIAGNOSIS — M79642 Pain in left hand: Secondary | ICD-10-CM | POA: Insufficient documentation

## 2019-12-25 DIAGNOSIS — Z992 Dependence on renal dialysis: Secondary | ICD-10-CM | POA: Diagnosis not present

## 2019-12-25 DIAGNOSIS — N2581 Secondary hyperparathyroidism of renal origin: Secondary | ICD-10-CM | POA: Diagnosis not present

## 2019-12-25 DIAGNOSIS — N186 End stage renal disease: Secondary | ICD-10-CM | POA: Diagnosis not present

## 2019-12-27 DIAGNOSIS — N186 End stage renal disease: Secondary | ICD-10-CM | POA: Diagnosis not present

## 2019-12-27 DIAGNOSIS — N2581 Secondary hyperparathyroidism of renal origin: Secondary | ICD-10-CM | POA: Diagnosis not present

## 2019-12-27 DIAGNOSIS — Z992 Dependence on renal dialysis: Secondary | ICD-10-CM | POA: Diagnosis not present

## 2019-12-28 DIAGNOSIS — M25561 Pain in right knee: Secondary | ICD-10-CM | POA: Diagnosis not present

## 2019-12-28 DIAGNOSIS — M6281 Muscle weakness (generalized): Secondary | ICD-10-CM | POA: Diagnosis not present

## 2019-12-28 DIAGNOSIS — R262 Difficulty in walking, not elsewhere classified: Secondary | ICD-10-CM | POA: Diagnosis not present

## 2019-12-28 DIAGNOSIS — M79604 Pain in right leg: Secondary | ICD-10-CM | POA: Diagnosis not present

## 2019-12-29 DIAGNOSIS — N2581 Secondary hyperparathyroidism of renal origin: Secondary | ICD-10-CM | POA: Diagnosis not present

## 2019-12-29 DIAGNOSIS — N186 End stage renal disease: Secondary | ICD-10-CM | POA: Diagnosis not present

## 2019-12-29 DIAGNOSIS — Z992 Dependence on renal dialysis: Secondary | ICD-10-CM | POA: Diagnosis not present

## 2019-12-31 DIAGNOSIS — R262 Difficulty in walking, not elsewhere classified: Secondary | ICD-10-CM | POA: Diagnosis not present

## 2019-12-31 DIAGNOSIS — M25561 Pain in right knee: Secondary | ICD-10-CM | POA: Diagnosis not present

## 2019-12-31 DIAGNOSIS — M6281 Muscle weakness (generalized): Secondary | ICD-10-CM | POA: Diagnosis not present

## 2019-12-31 DIAGNOSIS — M79604 Pain in right leg: Secondary | ICD-10-CM | POA: Diagnosis not present

## 2020-01-01 DIAGNOSIS — Z992 Dependence on renal dialysis: Secondary | ICD-10-CM | POA: Diagnosis not present

## 2020-01-01 DIAGNOSIS — N186 End stage renal disease: Secondary | ICD-10-CM | POA: Diagnosis not present

## 2020-01-01 DIAGNOSIS — N2581 Secondary hyperparathyroidism of renal origin: Secondary | ICD-10-CM | POA: Diagnosis not present

## 2020-01-02 DIAGNOSIS — E1122 Type 2 diabetes mellitus with diabetic chronic kidney disease: Secondary | ICD-10-CM | POA: Diagnosis not present

## 2020-01-02 DIAGNOSIS — N186 End stage renal disease: Secondary | ICD-10-CM | POA: Diagnosis not present

## 2020-01-02 DIAGNOSIS — Z992 Dependence on renal dialysis: Secondary | ICD-10-CM | POA: Diagnosis not present

## 2020-01-03 DIAGNOSIS — R296 Repeated falls: Secondary | ICD-10-CM | POA: Diagnosis not present

## 2020-01-03 DIAGNOSIS — Z992 Dependence on renal dialysis: Secondary | ICD-10-CM | POA: Diagnosis not present

## 2020-01-03 DIAGNOSIS — S60222A Contusion of left hand, initial encounter: Secondary | ICD-10-CM | POA: Diagnosis not present

## 2020-01-03 DIAGNOSIS — N186 End stage renal disease: Secondary | ICD-10-CM | POA: Diagnosis not present

## 2020-01-03 DIAGNOSIS — N2581 Secondary hyperparathyroidism of renal origin: Secondary | ICD-10-CM | POA: Diagnosis not present

## 2020-01-03 DIAGNOSIS — R2232 Localized swelling, mass and lump, left upper limb: Secondary | ICD-10-CM | POA: Diagnosis not present

## 2020-01-03 DIAGNOSIS — S62609A Fracture of unspecified phalanx of unspecified finger, initial encounter for closed fracture: Secondary | ICD-10-CM | POA: Diagnosis not present

## 2020-01-04 ENCOUNTER — Encounter: Payer: Self-pay | Admitting: Cardiovascular Disease

## 2020-01-04 ENCOUNTER — Ambulatory Visit (INDEPENDENT_AMBULATORY_CARE_PROVIDER_SITE_OTHER): Payer: Medicare HMO | Admitting: Cardiovascular Disease

## 2020-01-04 ENCOUNTER — Other Ambulatory Visit: Payer: Self-pay

## 2020-01-04 VITALS — BP 100/58 | HR 67 | Ht 64.0 in | Wt 174.0 lb

## 2020-01-04 DIAGNOSIS — I10 Essential (primary) hypertension: Secondary | ICD-10-CM

## 2020-01-04 DIAGNOSIS — E7800 Pure hypercholesterolemia, unspecified: Secondary | ICD-10-CM | POA: Diagnosis not present

## 2020-01-04 DIAGNOSIS — M25642 Stiffness of left hand, not elsewhere classified: Secondary | ICD-10-CM | POA: Diagnosis not present

## 2020-01-04 DIAGNOSIS — S62644A Nondisplaced fracture of proximal phalanx of right ring finger, initial encounter for closed fracture: Secondary | ICD-10-CM | POA: Diagnosis not present

## 2020-01-04 DIAGNOSIS — M79642 Pain in left hand: Secondary | ICD-10-CM | POA: Diagnosis not present

## 2020-01-04 DIAGNOSIS — I739 Peripheral vascular disease, unspecified: Secondary | ICD-10-CM

## 2020-01-04 DIAGNOSIS — I998 Other disorder of circulatory system: Secondary | ICD-10-CM | POA: Diagnosis not present

## 2020-01-04 DIAGNOSIS — I70229 Atherosclerosis of native arteries of extremities with rest pain, unspecified extremity: Secondary | ICD-10-CM

## 2020-01-04 NOTE — Assessment & Plan Note (Signed)
History of essential hypertension blood pressure measured today at 100/58.  She is not on antihypertensive medications.

## 2020-01-04 NOTE — Assessment & Plan Note (Signed)
History of critical limb ischemia with nonhealing wounds on her right foot status post right SFA intervention by myself 01/30/2018 with ultimate healing.  Her most recent Doppler studies performed 12/15/2019 revealed a right ABI of 1.02 with a patent SFA.  At the time of the angiogram I did notice a high-grade mid left SFA stenosis.  She does exercise daily on a treadmill and notices left calf claudication and wishes to proceed with angiography and endovascular therapy for lifestyle limiting claudication.

## 2020-01-04 NOTE — Patient Instructions (Signed)
Medication Instructions:  NO CHANGE *If you need a refill on your cardiac medications before your next appointment, please call your pharmacy*   Lab Work: If you have labs (blood work) drawn today and your tests are completely normal, you will receive your results only by: Marland Kitchen MyChart Message (if you have MyChart) OR . A paper copy in the mail If you have any lab test that is abnormal or we need to change your treatment, we will call you to review the results   Follow-Up: At [facility], you and your health needs are our priority.  As part of our continuing mission to provide you with exceptional heart care, we have created designated Provider Care Teams.  These Care Teams include your primary Cardiologist (physician) and Advanced Practice Providers (APPs -  Physician Assistants and Nurse Practitioners) who all work together to provide you with the care you need, when you need it.  We recommend signing up for the patient portal called "MyChart".  Sign up information is provided on this After Visit Summary.  MyChart is used to connect with patients for Virtual Visits (Telemedicine).  Patients are able to view lab/test results, encounter notes, upcoming appointments, etc.  Non-urgent messages can be sent to your provider as well.   To learn more about what you can do with MyChart, go to [email or web address].    Your next appointment:    Your physician recommends that you schedule a follow-up appointment in: [place] APP

## 2020-01-04 NOTE — Progress Notes (Signed)
01/04/2020 Joanne Carlson   December 27, 1945  [id number]  Primary Physician Joanne Shanks, MD Primary Cardiologist: Joanne Harp MD Joanne Carlson, [place]  HPI:  Joanne Carlson is a 74 y.o.  mother of 2 children referred by Joanne Carlson for peripheral vascular evaluation because of critical limb ischemia. I last spoke to her for telemedicine virtual telephone visit 12/13/2018. She has a history of treated hypertension, diabetes and hyperlipidemia. 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 Joanne Carlson placed an AV fistula. She's never had a heart attack or stroke. She denies chest pain or shortness of breath. She's had a small non-healing ulcer on her left great toe treated with antibiotics recently and according to her this is slowly healing. Lower extremity arterial Doppler studies performed in our office 01/03/17 revealed ABIs approximately 0.8 bilaterally with an occluded distal right SFA and high-grade left SFA stenosis with three-vessel runoff.  She had developed a nonhealing wound on her right great toe. She is currently on hemodialysis. I angiogram to her 01/30/2018 revealing a 99% calcified mid right SFA stenosis. I performed Joanne Carlson followed by drug-eluting balloon angioplasty with excellent result. Dopplers improved. Her wound ultimately healed.  Since I spoke to her a year ago the wound on her right great toe has healed and her Dopplers performed 12/11/2018 suggest a patent right SFA.  She denies chest pain or shortness of breath.    She does notice left calf claudication when she exercises on the treadmill on a daily basis.  She did have a high-grade calcified lesion in the mid left SFA at the time of angiography 01/30/2018.  She wishes to wait about a month before we entertain peripheral vascular angiography until she heals her right hand.  I will have her come back to see an Joanne Carlson for work-up and scheduling of her PV  angiogram at that time.     Current Meds  Medication Sig  . allopurinol (ZYLOPRIM) 100 MG tablet Take 200 mg by mouth daily.   Marland Kitchen aspirin EC 81 MG tablet Take 81 mg by mouth daily.  Marland Kitchen atorvastatin (LIPITOR) 80 MG tablet TAKE 1 TABLET BY MOUTH ONCE DAILY AT  6  PM  . b complex-vitamin c-folic acid (NEPHRO-VITE) 0.8 MG TABS tablet Take 1 tablet by mouth daily.  . Blood Glucose Monitoring Suppl (ACCU-CHEK AVIVA PLUS) w/Device KIT   . calcium acetate (PHOSLO) 667 MG capsule Take 667-1,334 mg by mouth See admin instructions. Take 2 caps three times a day with meals. Take 1 cap with snacks twice daily.  . cinacalcet (SENSIPAR) 60 MG tablet Take 60 mg by mouth daily.   . clopidogrel (PLAVIX) 75 MG tablet Take 1 tablet (75 mg total) by mouth daily. <PLEASE MAKE APPOINTMENT FOR REFILLS>  . gabapentin (NEURONTIN) 100 MG capsule Take 1 capsule by mouth every morning and 1 capsule at bedtime. PATIENT NEEDS OFFICE VISIT FOR ADDITIONAL REFILLS (Patient taking differently: Take 100 mg by mouth daily as needed (pain). )  . glucose blood test strip Use to test blood sugar daily. Dx code: 65.02.  Marland Kitchen Lancets MISC Use to test blood sugar daily. Dx code 250.02.  . lidocaine-prilocaine (EMLA) cream Apply 1 application topically 3 (three) times a week. Tuesday, Thursday and Saturday  . midodrine (PROAMATINE) 5 MG tablet Take 5 mg by mouth 2 (two) times daily.     Allergies  Allergen Reactions  . Tylenol [Acetaminophen] Rash  Social History   Socioeconomic History  . Marital status: Widowed    Spouse name: Not on file  . Number of children: Not on file  . Years of education: Not on file  . Highest education level: Not on file  Occupational History  . Not on file  Tobacco Use  . Smoking status: Former Smoker    Years: 10.00    Types: Cigarettes    Quit date: 1970    Years since quitting: 51.5  . Smokeless tobacco: Never Used  . Tobacco comment: "never a heavy smoker; smoked off and on"  Vaping  Use  . Vaping Use: Never used  Substance and Sexual Activity  . Alcohol use: Not Currently    Comment: "in my younger days"  . Drug use: Not Currently    Types: Marijuana    Comment: "weed in my teens"  . Sexual activity: Not Currently    Birth control/protection: Post-menopausal  Other Topics Concern  . Not on file  Social History Narrative  . Not on file   Social Determinants of Health   Financial Resource Strain:   . Difficulty of Paying Living Expenses:   Food Insecurity:   . Worried About Charity fundraiser in the Last Year:   . Arboriculturist in the Last Year:   Transportation Needs:   . Film/video editor (Medical):   Marland Kitchen Lack of Transportation (Non-Medical):   Physical Activity:   . Days of Exercise per Week:   . Minutes of Exercise per Session:   Stress:   . Feeling of Stress :   Social Connections:   . Frequency of Communication with Friends and Family:   . Frequency of Social Gatherings with Friends and Family:   . Attends Religious Services:   . Active Member of Clubs or Organizations:   . Attends Archivist Meetings:   Marland Kitchen Marital Status:   Intimate Partner Violence:   . Fear of Current or Ex-Partner:   . Emotionally Abused:   Marland Kitchen Physically Abused:   . Sexually Abused:      Review of Systems: General: negative for chills, fever, night sweats or weight changes.  Cardiovascular: negative for chest pain, dyspnea on exertion, edema, orthopnea, palpitations, paroxysmal nocturnal dyspnea or shortness of breath Dermatological: negative for rash Respiratory: negative for cough or wheezing Urologic: negative for hematuria Abdominal: negative for nausea, vomiting, diarrhea, bright red blood per rectum, melena, or hematemesis Neurologic: negative for visual changes, syncope, or dizziness All other systems reviewed and are otherwise negative except as noted above.    Blood pressure (!) 100/58, pulse 67, height 5' 4" (1.626 m), weight 174 lb (78.9  kg), SpO2 91 %.  General appearance: alert and no distress Neck: no adenopathy, no carotid bruit, no JVD, supple, symmetrical, trachea midline and thyroid not enlarged, symmetric, no tenderness/mass/nodules Lungs: clear to auscultation bilaterally Heart: regular rate and rhythm, S1, S2 normal, no murmur, click, rub or gallop Extremities: extremities normal, atraumatic, no cyanosis or edema Pulses: 2+ and symmetric Skin: Skin color, texture, turgor normal. No rashes or lesions Neurologic: Alert and oriented X 3, normal strength and tone. Normal symmetric reflexes. Normal coordination and gait  EKG sinus rhythm at 67 with low limb voltage and poor R wave progression.  I personally reviewed this EKG.  ASSESSMENT AND PLAN:   HYPERCHOLESTEROLEMIA History of hyperlipidemia on statin therapy with lipid profile performed 09/15/2018 revealing total cholesterol 140, LDL 77 and HDL 35.  HYPERTENSION, BENIGN History of  essential hypertension blood pressure measured today at 100/58.  She is not on antihypertensive medications.  Critical lower limb ischemia History of critical limb ischemia with nonhealing wounds on her right foot status post right SFA intervention by myself 01/30/2018 with ultimate healing.  Her most recent Doppler studies performed 12/15/2019 revealed a right ABI of 1.02 with a patent SFA.  At the time of the angiogram I did notice a high-grade mid left SFA stenosis.  She does exercise daily on a treadmill and notices left calf claudication and wishes to proceed with angiography and endovascular therapy for lifestyle limiting claudication.      Joanne Harp MD FACP,FACC,FAHA, [facility] 01/04/2020 11:15 AM

## 2020-01-04 NOTE — Assessment & Plan Note (Signed)
History of hyperlipidemia on statin therapy with lipid profile performed 09/15/2018 revealing total cholesterol 140, LDL 77 and HDL 35.

## 2020-01-05 DIAGNOSIS — N186 End stage renal disease: Secondary | ICD-10-CM | POA: Diagnosis not present

## 2020-01-05 DIAGNOSIS — N2581 Secondary hyperparathyroidism of renal origin: Secondary | ICD-10-CM | POA: Diagnosis not present

## 2020-01-05 DIAGNOSIS — Z992 Dependence on renal dialysis: Secondary | ICD-10-CM | POA: Diagnosis not present

## 2020-01-08 DIAGNOSIS — Z992 Dependence on renal dialysis: Secondary | ICD-10-CM | POA: Diagnosis not present

## 2020-01-08 DIAGNOSIS — N186 End stage renal disease: Secondary | ICD-10-CM | POA: Diagnosis not present

## 2020-01-08 DIAGNOSIS — N2581 Secondary hyperparathyroidism of renal origin: Secondary | ICD-10-CM | POA: Diagnosis not present

## 2020-01-09 DIAGNOSIS — Z4789 Encounter for other orthopedic aftercare: Secondary | ICD-10-CM | POA: Diagnosis not present

## 2020-01-09 DIAGNOSIS — M25531 Pain in right wrist: Secondary | ICD-10-CM | POA: Insufficient documentation

## 2020-01-09 DIAGNOSIS — M79642 Pain in left hand: Secondary | ICD-10-CM | POA: Diagnosis not present

## 2020-01-10 DIAGNOSIS — Z992 Dependence on renal dialysis: Secondary | ICD-10-CM | POA: Diagnosis not present

## 2020-01-10 DIAGNOSIS — N2581 Secondary hyperparathyroidism of renal origin: Secondary | ICD-10-CM | POA: Diagnosis not present

## 2020-01-10 DIAGNOSIS — E1140 Type 2 diabetes mellitus with diabetic neuropathy, unspecified: Secondary | ICD-10-CM | POA: Diagnosis not present

## 2020-01-10 DIAGNOSIS — I770 Arteriovenous fistula, acquired: Secondary | ICD-10-CM | POA: Diagnosis not present

## 2020-01-10 DIAGNOSIS — Z905 Acquired absence of kidney: Secondary | ICD-10-CM | POA: Diagnosis not present

## 2020-01-10 DIAGNOSIS — R296 Repeated falls: Secondary | ICD-10-CM | POA: Diagnosis not present

## 2020-01-10 DIAGNOSIS — I10 Essential (primary) hypertension: Secondary | ICD-10-CM | POA: Diagnosis not present

## 2020-01-10 DIAGNOSIS — M810 Age-related osteoporosis without current pathological fracture: Secondary | ICD-10-CM | POA: Diagnosis not present

## 2020-01-10 DIAGNOSIS — N186 End stage renal disease: Secondary | ICD-10-CM | POA: Diagnosis not present

## 2020-01-10 DIAGNOSIS — N185 Chronic kidney disease, stage 5: Secondary | ICD-10-CM | POA: Diagnosis not present

## 2020-01-10 DIAGNOSIS — E785 Hyperlipidemia, unspecified: Secondary | ICD-10-CM | POA: Diagnosis not present

## 2020-01-11 DIAGNOSIS — M25561 Pain in right knee: Secondary | ICD-10-CM | POA: Diagnosis not present

## 2020-01-11 DIAGNOSIS — R262 Difficulty in walking, not elsewhere classified: Secondary | ICD-10-CM | POA: Diagnosis not present

## 2020-01-11 DIAGNOSIS — M6281 Muscle weakness (generalized): Secondary | ICD-10-CM | POA: Diagnosis not present

## 2020-01-11 DIAGNOSIS — M79604 Pain in right leg: Secondary | ICD-10-CM | POA: Diagnosis not present

## 2020-01-12 DIAGNOSIS — Z992 Dependence on renal dialysis: Secondary | ICD-10-CM | POA: Diagnosis not present

## 2020-01-12 DIAGNOSIS — N186 End stage renal disease: Secondary | ICD-10-CM | POA: Diagnosis not present

## 2020-01-12 DIAGNOSIS — N2581 Secondary hyperparathyroidism of renal origin: Secondary | ICD-10-CM | POA: Diagnosis not present

## 2020-01-14 DIAGNOSIS — M79604 Pain in right leg: Secondary | ICD-10-CM | POA: Diagnosis not present

## 2020-01-14 DIAGNOSIS — R262 Difficulty in walking, not elsewhere classified: Secondary | ICD-10-CM | POA: Diagnosis not present

## 2020-01-14 DIAGNOSIS — M6281 Muscle weakness (generalized): Secondary | ICD-10-CM | POA: Diagnosis not present

## 2020-01-14 DIAGNOSIS — M25561 Pain in right knee: Secondary | ICD-10-CM | POA: Diagnosis not present

## 2020-01-15 DIAGNOSIS — Z992 Dependence on renal dialysis: Secondary | ICD-10-CM | POA: Diagnosis not present

## 2020-01-15 DIAGNOSIS — E119 Type 2 diabetes mellitus without complications: Secondary | ICD-10-CM | POA: Diagnosis not present

## 2020-01-15 DIAGNOSIS — E118 Type 2 diabetes mellitus with unspecified complications: Secondary | ICD-10-CM | POA: Diagnosis not present

## 2020-01-15 DIAGNOSIS — N185 Chronic kidney disease, stage 5: Secondary | ICD-10-CM | POA: Diagnosis not present

## 2020-01-15 DIAGNOSIS — N186 End stage renal disease: Secondary | ICD-10-CM | POA: Diagnosis not present

## 2020-01-15 DIAGNOSIS — E1140 Type 2 diabetes mellitus with diabetic neuropathy, unspecified: Secondary | ICD-10-CM | POA: Diagnosis not present

## 2020-01-15 DIAGNOSIS — N2581 Secondary hyperparathyroidism of renal origin: Secondary | ICD-10-CM | POA: Diagnosis not present

## 2020-01-15 DIAGNOSIS — M810 Age-related osteoporosis without current pathological fracture: Secondary | ICD-10-CM | POA: Diagnosis not present

## 2020-01-15 DIAGNOSIS — E785 Hyperlipidemia, unspecified: Secondary | ICD-10-CM | POA: Diagnosis not present

## 2020-01-15 DIAGNOSIS — I10 Essential (primary) hypertension: Secondary | ICD-10-CM | POA: Diagnosis not present

## 2020-01-16 DIAGNOSIS — R262 Difficulty in walking, not elsewhere classified: Secondary | ICD-10-CM | POA: Diagnosis not present

## 2020-01-16 DIAGNOSIS — M79604 Pain in right leg: Secondary | ICD-10-CM | POA: Diagnosis not present

## 2020-01-16 DIAGNOSIS — M25561 Pain in right knee: Secondary | ICD-10-CM | POA: Diagnosis not present

## 2020-01-16 DIAGNOSIS — M6281 Muscle weakness (generalized): Secondary | ICD-10-CM | POA: Diagnosis not present

## 2020-01-17 DIAGNOSIS — Z992 Dependence on renal dialysis: Secondary | ICD-10-CM | POA: Diagnosis not present

## 2020-01-17 DIAGNOSIS — N186 End stage renal disease: Secondary | ICD-10-CM | POA: Diagnosis not present

## 2020-01-17 DIAGNOSIS — N2581 Secondary hyperparathyroidism of renal origin: Secondary | ICD-10-CM | POA: Diagnosis not present

## 2020-01-18 ENCOUNTER — Other Ambulatory Visit: Payer: Medicare HMO

## 2020-01-19 DIAGNOSIS — N2581 Secondary hyperparathyroidism of renal origin: Secondary | ICD-10-CM | POA: Diagnosis not present

## 2020-01-19 DIAGNOSIS — Z992 Dependence on renal dialysis: Secondary | ICD-10-CM | POA: Diagnosis not present

## 2020-01-19 DIAGNOSIS — N186 End stage renal disease: Secondary | ICD-10-CM | POA: Diagnosis not present

## 2020-01-21 DIAGNOSIS — M6281 Muscle weakness (generalized): Secondary | ICD-10-CM | POA: Diagnosis not present

## 2020-01-21 DIAGNOSIS — M25561 Pain in right knee: Secondary | ICD-10-CM | POA: Diagnosis not present

## 2020-01-21 DIAGNOSIS — M79604 Pain in right leg: Secondary | ICD-10-CM | POA: Diagnosis not present

## 2020-01-21 DIAGNOSIS — R262 Difficulty in walking, not elsewhere classified: Secondary | ICD-10-CM | POA: Diagnosis not present

## 2020-01-22 DIAGNOSIS — N186 End stage renal disease: Secondary | ICD-10-CM | POA: Diagnosis not present

## 2020-01-22 DIAGNOSIS — N2581 Secondary hyperparathyroidism of renal origin: Secondary | ICD-10-CM | POA: Diagnosis not present

## 2020-01-22 DIAGNOSIS — Z992 Dependence on renal dialysis: Secondary | ICD-10-CM | POA: Diagnosis not present

## 2020-01-23 DIAGNOSIS — M25531 Pain in right wrist: Secondary | ICD-10-CM | POA: Diagnosis not present

## 2020-01-23 DIAGNOSIS — S62613D Displaced fracture of proximal phalanx of left middle finger, subsequent encounter for fracture with routine healing: Secondary | ICD-10-CM | POA: Diagnosis not present

## 2020-01-23 DIAGNOSIS — M79642 Pain in left hand: Secondary | ICD-10-CM | POA: Diagnosis not present

## 2020-01-23 DIAGNOSIS — S62611D Displaced fracture of proximal phalanx of left index finger, subsequent encounter for fracture with routine healing: Secondary | ICD-10-CM | POA: Diagnosis not present

## 2020-01-23 DIAGNOSIS — S62619A Displaced fracture of proximal phalanx of unspecified finger, initial encounter for closed fracture: Secondary | ICD-10-CM | POA: Insufficient documentation

## 2020-01-23 DIAGNOSIS — M25642 Stiffness of left hand, not elsewhere classified: Secondary | ICD-10-CM | POA: Diagnosis not present

## 2020-01-24 DIAGNOSIS — N186 End stage renal disease: Secondary | ICD-10-CM | POA: Diagnosis not present

## 2020-01-24 DIAGNOSIS — N2581 Secondary hyperparathyroidism of renal origin: Secondary | ICD-10-CM | POA: Diagnosis not present

## 2020-01-24 DIAGNOSIS — Z992 Dependence on renal dialysis: Secondary | ICD-10-CM | POA: Diagnosis not present

## 2020-01-25 DIAGNOSIS — M545 Low back pain: Secondary | ICD-10-CM | POA: Diagnosis not present

## 2020-01-25 DIAGNOSIS — I10 Essential (primary) hypertension: Secondary | ICD-10-CM | POA: Diagnosis not present

## 2020-01-25 DIAGNOSIS — Z992 Dependence on renal dialysis: Secondary | ICD-10-CM | POA: Diagnosis not present

## 2020-01-25 DIAGNOSIS — E1140 Type 2 diabetes mellitus with diabetic neuropathy, unspecified: Secondary | ICD-10-CM | POA: Diagnosis not present

## 2020-01-25 DIAGNOSIS — R296 Repeated falls: Secondary | ICD-10-CM | POA: Diagnosis not present

## 2020-01-25 DIAGNOSIS — E785 Hyperlipidemia, unspecified: Secondary | ICD-10-CM | POA: Diagnosis not present

## 2020-01-25 DIAGNOSIS — N2581 Secondary hyperparathyroidism of renal origin: Secondary | ICD-10-CM | POA: Diagnosis not present

## 2020-01-25 DIAGNOSIS — N185 Chronic kidney disease, stage 5: Secondary | ICD-10-CM | POA: Diagnosis not present

## 2020-01-25 DIAGNOSIS — M810 Age-related osteoporosis without current pathological fracture: Secondary | ICD-10-CM | POA: Diagnosis not present

## 2020-01-25 DIAGNOSIS — I770 Arteriovenous fistula, acquired: Secondary | ICD-10-CM | POA: Diagnosis not present

## 2020-01-25 DIAGNOSIS — I739 Peripheral vascular disease, unspecified: Secondary | ICD-10-CM | POA: Diagnosis not present

## 2020-01-25 DIAGNOSIS — G8929 Other chronic pain: Secondary | ICD-10-CM | POA: Diagnosis not present

## 2020-01-26 DIAGNOSIS — N2581 Secondary hyperparathyroidism of renal origin: Secondary | ICD-10-CM | POA: Diagnosis not present

## 2020-01-26 DIAGNOSIS — Z992 Dependence on renal dialysis: Secondary | ICD-10-CM | POA: Diagnosis not present

## 2020-01-26 DIAGNOSIS — N186 End stage renal disease: Secondary | ICD-10-CM | POA: Diagnosis not present

## 2020-01-28 DIAGNOSIS — N186 End stage renal disease: Secondary | ICD-10-CM | POA: Diagnosis not present

## 2020-01-28 DIAGNOSIS — I871 Compression of vein: Secondary | ICD-10-CM | POA: Diagnosis not present

## 2020-01-28 DIAGNOSIS — Z992 Dependence on renal dialysis: Secondary | ICD-10-CM | POA: Diagnosis not present

## 2020-01-29 DIAGNOSIS — Z992 Dependence on renal dialysis: Secondary | ICD-10-CM | POA: Diagnosis not present

## 2020-01-29 DIAGNOSIS — N186 End stage renal disease: Secondary | ICD-10-CM | POA: Diagnosis not present

## 2020-01-29 DIAGNOSIS — N2581 Secondary hyperparathyroidism of renal origin: Secondary | ICD-10-CM | POA: Diagnosis not present

## 2020-01-30 DIAGNOSIS — M79604 Pain in right leg: Secondary | ICD-10-CM | POA: Diagnosis not present

## 2020-01-30 DIAGNOSIS — M25561 Pain in right knee: Secondary | ICD-10-CM | POA: Diagnosis not present

## 2020-01-30 DIAGNOSIS — M6281 Muscle weakness (generalized): Secondary | ICD-10-CM | POA: Diagnosis not present

## 2020-01-30 DIAGNOSIS — R262 Difficulty in walking, not elsewhere classified: Secondary | ICD-10-CM | POA: Diagnosis not present

## 2020-01-31 DIAGNOSIS — N186 End stage renal disease: Secondary | ICD-10-CM | POA: Diagnosis not present

## 2020-01-31 DIAGNOSIS — N2581 Secondary hyperparathyroidism of renal origin: Secondary | ICD-10-CM | POA: Diagnosis not present

## 2020-01-31 DIAGNOSIS — Z992 Dependence on renal dialysis: Secondary | ICD-10-CM | POA: Diagnosis not present

## 2020-02-01 DIAGNOSIS — R262 Difficulty in walking, not elsewhere classified: Secondary | ICD-10-CM | POA: Diagnosis not present

## 2020-02-01 DIAGNOSIS — M79604 Pain in right leg: Secondary | ICD-10-CM | POA: Diagnosis not present

## 2020-02-01 DIAGNOSIS — M25561 Pain in right knee: Secondary | ICD-10-CM | POA: Diagnosis not present

## 2020-02-01 DIAGNOSIS — M6281 Muscle weakness (generalized): Secondary | ICD-10-CM | POA: Diagnosis not present

## 2020-02-02 ENCOUNTER — Other Ambulatory Visit: Payer: Self-pay | Admitting: Cardiovascular Disease

## 2020-02-02 DIAGNOSIS — E1122 Type 2 diabetes mellitus with diabetic chronic kidney disease: Secondary | ICD-10-CM | POA: Diagnosis not present

## 2020-02-02 DIAGNOSIS — N186 End stage renal disease: Secondary | ICD-10-CM | POA: Diagnosis not present

## 2020-02-02 DIAGNOSIS — N2581 Secondary hyperparathyroidism of renal origin: Secondary | ICD-10-CM | POA: Diagnosis not present

## 2020-02-02 DIAGNOSIS — Z992 Dependence on renal dialysis: Secondary | ICD-10-CM | POA: Diagnosis not present

## 2020-02-05 DIAGNOSIS — Z992 Dependence on renal dialysis: Secondary | ICD-10-CM | POA: Diagnosis not present

## 2020-02-05 DIAGNOSIS — N186 End stage renal disease: Secondary | ICD-10-CM | POA: Diagnosis not present

## 2020-02-05 DIAGNOSIS — N2581 Secondary hyperparathyroidism of renal origin: Secondary | ICD-10-CM | POA: Diagnosis not present

## 2020-02-06 DIAGNOSIS — M25642 Stiffness of left hand, not elsewhere classified: Secondary | ICD-10-CM | POA: Diagnosis not present

## 2020-02-07 DIAGNOSIS — N186 End stage renal disease: Secondary | ICD-10-CM | POA: Diagnosis not present

## 2020-02-07 DIAGNOSIS — Z992 Dependence on renal dialysis: Secondary | ICD-10-CM | POA: Diagnosis not present

## 2020-02-07 DIAGNOSIS — N2581 Secondary hyperparathyroidism of renal origin: Secondary | ICD-10-CM | POA: Diagnosis not present

## 2020-02-07 NOTE — H&P (View-Only) (Signed)
Cardiology Office Note   Date:  02/08/2020   ID:  Joanne Carlson, Joanne Carlson 09-02-1945, MRN [id number]  PCP:  Vernie Shanks, MD  Cardiologist:  Avelina Laine  No chief complaint on file.    History of Present Illness: Joanne Carlson is a 74 y.o. female who presents for ongoing assessment and management of peripheral vascular disease, hypertension, hyperlipidemia, diabetes, chronic renal insufficiency on hemodialysis since April 2021 Tuesdays andThursdays.  She had a small nonhealing ulcer on her left great toe treated with antibiotics recently and according to to her on office visit ,it was healing to slowly.  Lower extremity arterial Doppler studies performed in our office on 01/03/2017 revealed ABIs of approximately 0.8 bilaterally with an occluded distal right FSA, and high-grade left SFA stenosis with three-vessel runoff.  On last office visit with Dr. Gwenlyn Found on 01/04/2020.  Her right great toe had healed and her Dopplers performed on 12/11/2018 suggested patent right FSA.  She did notice left calf claudication when she exercised on a treadmill which she did on a daily basis.  At that time she wished to wait about a month before entertaining peripheral vascular angiography as she wanted to heal her right hand.  She is here to have further work-up and scheduling of her PV angiogram.  She continues wearing a brace to her right wrist but is ready to proceed with PV angiogram.. She states that her right toe has healed and she is able to walk on it without pain.  She is medically compliant and goes to dialysis as directed.    Past Medical History:  Diagnosis Date  . Arthritis    "legs, knees; not bad" (01/30/2018)  . Dizziness    occasionally  . ESRD (end stage renal disease) ([place])    [name]; TTS" (01/30/2018)  . History of colon polyps   . History of gout    takes Allopurinol daily (01/30/2018)  . Hyperlipidemia    takes Pravastatin daily  . Hypertension    takes Monopril daily  . Joint pain    . PAD (peripheral artery disease) ([place])   . Peripheral neuropathy   . Peripheral vascular disease ([place])   . Pneumonia    "once; years ago" (01/30/2018)  . Refusal of blood transfusions as patient is Jehovah's Witness    NO BLOOD OR BLOOD PRODUCTS. Albumin okay.   . Stroke ([place]) 1990s   "mini stroke; left lower mouth a little bit twisted since" (01/30/2018)  . Type II diabetes mellitus ([place])    diet controlled-    Past Surgical History:  Procedure Laterality Date  . ABDOMINAL AORTOGRAM W/LOWER EXTREMITY N/A 01/30/2018   Procedure: ABDOMINAL AORTOGRAM W/LOWER EXTREMITY;  Surgeon: Lorretta Harp, MD;  Location: [facility];  Service: Cardiovascular;  Laterality: N/A;  . APPENDECTOMY    . BASCILIC VEIN TRANSPOSITION Right 10/03/2013   Procedure: BRACHIOCEPHALIC Arteriovenous Fistula ;  Surgeon: Mal Misty, MD;  Location: [facility];  Service: Vascular;  Laterality: Right;  . BASCILIC VEIN TRANSPOSITION Right 11/17/2016   Procedure: BASCILIC VEIN TRANSPOSITION- RIGHT SINGLE STAGE;  Surgeon: Conrad Davy, MD;  Location: [facility];  Service: Vascular;  Laterality: Right;  . COLONOSCOPY    . CYST EXCISION  1980's   cyst removed from lower abdomen  . DILATION AND CURETTAGE OF UTERUS    . ESOPHAGOGASTRODUODENOSCOPY    . PERIPHERAL VASCULAR BALLOON ANGIOPLASTY Right 01/30/2018   Procedure: PERIPHERAL VASCULAR BALLOON ANGIOPLASTY;  Surgeon: Lorretta Harp, MD;  Location:  [facility];  Service: Cardiovascular;  Laterality: Right;  superficial femoral  . SHOULDER ARTHROSCOPY W/ ROTATOR CUFF REPAIR Right 2013  . TUBAL LIGATION    . UPPER EXTREMITY VENOGRAPHY Bilateral 11/08/2016   Procedure: Bilateral Upper Extremity Venography;  Surgeon: Conrad , MD;  Location: [facility];  Service: Cardiovascular;  Laterality: Bilateral;     Current Outpatient Medications  Medication Sig Dispense Refill  . allopurinol (ZYLOPRIM) 100 MG tablet Take 200 mg by mouth daily.     Marland Kitchen aspirin  EC 81 MG tablet Take 81 mg by mouth daily.    Marland Kitchen atorvastatin (LIPITOR) 80 MG tablet TAKE 1 TABLET BY MOUTH ONCE DAILY AT  6  PM 90 tablet 3  . b complex-vitamin c-folic acid (NEPHRO-VITE) 0.8 MG TABS tablet Take 1 tablet by mouth daily.    . Blood Glucose Monitoring Suppl (ACCU-CHEK AVIVA PLUS) w/Device KIT   0  . calcium acetate (PHOSLO) 667 MG capsule Take 667-1,334 mg by mouth See admin instructions. Take 2 caps three times a day with meals. Take 1 cap with snacks twice daily.    . cinacalcet (SENSIPAR) 60 MG tablet Take 60 mg by mouth daily.     . clopidogrel (PLAVIX) 75 MG tablet TAKE 1 TABLET BY MOUTH ONCE DAILY **PLEASE  MAKE  APPOINTMENT  FOR  REFILLS** 90 tablet 1  . gabapentin (NEURONTIN) 100 MG capsule Take 1 capsule by mouth every morning and 1 capsule at bedtime. PATIENT NEEDS OFFICE VISIT FOR ADDITIONAL REFILLS (Patient taking differently: Take 100 mg by mouth daily as needed (pain). ) 60 capsule 0  . glucose blood test strip Use to test blood sugar daily. Dx code: 250.02. 100 each 3  . Lancets MISC Use to test blood sugar daily. Dx code 250.02. 100 each 3  . lidocaine-prilocaine (EMLA) cream Apply 1 application topically 3 (three) times a week. Tuesday, Thursday and Saturday  12  . midodrine (PROAMATINE) 5 MG tablet Take 5 mg by mouth 2 (two) times daily.     No current facility-administered medications for this visit.    Allergies:   Tylenol [acetaminophen]    Social History:  The patient  reports that she quit smoking about 51 years ago. Her smoking use included cigarettes. She quit after 10.00 years of use. She has never used smokeless tobacco. She reports previous alcohol use. She reports previous drug use. Drug: Marijuana.   Family History:  The patient's family history includes Depression in her father, mother, and sister; Hypertension in her mother and sister.    ROS: All other systems are reviewed and negative. Unless otherwise mentioned in H&P    PHYSICAL EXAM: VS:   BP (!) 124/58   Pulse 84   Ht _0  (1.626 m)   Wt 174 lb 9.6 oz (79.2 kg)   SpO2 93%   BMI 29.97 kg/m  , BMI Body mass index is 29.97 kg/m. GEN: Well nourished, well developed, in no acute distress HEENT: normal Neck: no JVD, carotid bruits, or masses Cardiac: RRR; 2/6 systolic  murmurs, rubs, or gallops,no edema  Respiratory:  Clear to auscultation bilaterally, normal work of breathing GI: soft, nontender, nondistended, + BS MS: no deformity or atrophy.Right wrist brace in place.  Skin: warm and dry, no rash Neuro:  Strength and sensation are intact Psych: euthymic mood, full affect   EKG:  Not completed this office visit.  Recent Labs: No results found for requested labs within last 8760 hours.  Lipid Panel    Component Value Date/Time   CHOL 275 (H) 07/22/2008 2241   TRIG 211 (H) 07/22/2008 2241   HDL 41 07/22/2008 2241   CHOLHDL 6.7 Ratio 07/22/2008 2241   VLDL 42 (H) 07/22/2008 2241   LDLCALC 192 (H) 07/22/2008 2241      Wt Readings from Last 3 Encounters:  02/08/20 174 lb 9.6 oz (79.2 kg)  01/04/20 174 lb (78.9 kg)  06/22/19 171 lb 3.2 oz (77.7 kg)      Other studies Reviewed: ABI 12/12/2019 Summary:  Right: Resting right ankle-brachial index is within normal range.  No evidence of significant right lower extremity arterial disease. The  right toe-brachial index is abnormal.   Left: Resting left ankle-brachial index indicates noncompressible left  lower extremity arteries.  The left toe-brachial index is abnormal.   ASSESSMENT AND PLAN:  1. PAD: Followed by Dr.Berry with complaints of intermittent claudication She is ready to proceed with PV angiography with Dr.Berry as discussed in his previous note on 01/04/2020. Lower extremity arterial Doppler studies performed in our office on 01/03/2017 revealed ABIs of approximately 0.8 bilaterally with an occluded distal right FSA, and high-grade left SFA stenosis with three-vessel runoff.  The patient  understands that risks include but are not limited to stroke (1 in 1000), death (1 in 89), kidney failure [usually temporary] (1 in 500), bleeding (1 in 200), allergic reaction [possibly serious] (1 in 200), and agrees to proceed.   She is a dialysis patient with dialysis on Tuesdays and Thursdays. She will be scheduled on Monday, March 03, 2020 to have procedure. She will remain on clopidogrel as directed    2. Hypertension: Excellent control of BP. No changes in her regimen.   3. Type II Diabetes: Followed by her PCP.  4. Hyperlipidemia: On high dose statin therapy with atorvastatin.    Current medicines are reviewed at length with the patient today.  I have spent 30 minutes dedicated to the care of this patient on the date of this encounter to include pre-visit review of records, assessment, management and diagnostic testing,orders, with shared decision making.  Labs/ tests ordered today include: PV angiography with Dr. Gwenlyn Found. Pre-procedure labs.   Phill Myron. West Pugh, ANP, AACC   02/08/2020 11:04 AM    [place] [address] Office [phone] Fax [phone]  Notice: This dictation was prepared with Dragon dictation along with smaller phrase technology. Any transcriptional errors that result from this process are unintentional and may not be corrected upon review.

## 2020-02-07 NOTE — Progress Notes (Signed)
Cardiology Office Note   Date:  02/08/2020   ID:  Joanne Carlson, Joanne Carlson 09-02-1945, MRN [id number]  PCP:  Vernie Shanks, MD  Cardiologist:  Avelina Laine  No chief complaint on file.    History of Present Illness: Joanne Carlson is a 74 y.o. female who presents for ongoing assessment and management of peripheral vascular disease, hypertension, hyperlipidemia, diabetes, chronic renal insufficiency on hemodialysis since April 2021 Tuesdays andThursdays.  She had a small nonhealing ulcer on her left great toe treated with antibiotics recently and according to to her on office visit ,it was healing to slowly.  Lower extremity arterial Doppler studies performed in our office on 01/03/2017 revealed ABIs of approximately 0.8 bilaterally with an occluded distal right FSA, and high-grade left SFA stenosis with three-vessel runoff.  On last office visit with Dr. Gwenlyn Found on 01/04/2020.  Her right great toe had healed and her Dopplers performed on 12/11/2018 suggested patent right FSA.  She did notice left calf claudication when she exercised on a treadmill which she did on a daily basis.  At that time she wished to wait about a month before entertaining peripheral vascular angiography as she wanted to heal her right hand.  She is here to have further work-up and scheduling of her PV angiogram.  She continues wearing a brace to her right wrist but is ready to proceed with PV angiogram.. She states that her right toe has healed and she is able to walk on it without pain.  She is medically compliant and goes to dialysis as directed.    Past Medical History:  Diagnosis Date  . Arthritis    "legs, knees; not bad" (01/30/2018)  . Dizziness    occasionally  . ESRD (end stage renal disease) ([place])    [name]; TTS" (01/30/2018)  . History of colon polyps   . History of gout    takes Allopurinol daily (01/30/2018)  . Hyperlipidemia    takes Pravastatin daily  . Hypertension    takes Monopril daily  . Joint pain    . PAD (peripheral artery disease) ([place])   . Peripheral neuropathy   . Peripheral vascular disease ([place])   . Pneumonia    "once; years ago" (01/30/2018)  . Refusal of blood transfusions as patient is Jehovah's Witness    NO BLOOD OR BLOOD PRODUCTS. Albumin okay.   . Stroke ([place]) 1990s   "mini stroke; left lower mouth a little bit twisted since" (01/30/2018)  . Type II diabetes mellitus ([place])    diet controlled-    Past Surgical History:  Procedure Laterality Date  . ABDOMINAL AORTOGRAM W/LOWER EXTREMITY N/A 01/30/2018   Procedure: ABDOMINAL AORTOGRAM W/LOWER EXTREMITY;  Surgeon: Lorretta Harp, MD;  Location: [facility];  Service: Cardiovascular;  Laterality: N/A;  . APPENDECTOMY    . BASCILIC VEIN TRANSPOSITION Right 10/03/2013   Procedure: BRACHIOCEPHALIC Arteriovenous Fistula ;  Surgeon: Mal Misty, MD;  Location: [facility];  Service: Vascular;  Laterality: Right;  . BASCILIC VEIN TRANSPOSITION Right 11/17/2016   Procedure: BASCILIC VEIN TRANSPOSITION- RIGHT SINGLE STAGE;  Surgeon: Conrad Davy, MD;  Location: [facility];  Service: Vascular;  Laterality: Right;  . COLONOSCOPY    . CYST EXCISION  1980's   cyst removed from lower abdomen  . DILATION AND CURETTAGE OF UTERUS    . ESOPHAGOGASTRODUODENOSCOPY    . PERIPHERAL VASCULAR BALLOON ANGIOPLASTY Right 01/30/2018   Procedure: PERIPHERAL VASCULAR BALLOON ANGIOPLASTY;  Surgeon: Lorretta Harp, MD;  Location:  [facility];  Service: Cardiovascular;  Laterality: Right;  superficial femoral  . SHOULDER ARTHROSCOPY W/ ROTATOR CUFF REPAIR Right 2013  . TUBAL LIGATION    . UPPER EXTREMITY VENOGRAPHY Bilateral 11/08/2016   Procedure: Bilateral Upper Extremity Venography;  Surgeon: Conrad , MD;  Location: [facility];  Service: Cardiovascular;  Laterality: Bilateral;     Current Outpatient Medications  Medication Sig Dispense Refill  . allopurinol (ZYLOPRIM) 100 MG tablet Take 200 mg by mouth daily.     Marland Kitchen aspirin  EC 81 MG tablet Take 81 mg by mouth daily.    Marland Kitchen atorvastatin (LIPITOR) 80 MG tablet TAKE 1 TABLET BY MOUTH ONCE DAILY AT  6  PM 90 tablet 3  . b complex-vitamin c-folic acid (NEPHRO-VITE) 0.8 MG TABS tablet Take 1 tablet by mouth daily.    . Blood Glucose Monitoring Suppl (ACCU-CHEK AVIVA PLUS) w/Device KIT   0  . calcium acetate (PHOSLO) 667 MG capsule Take 667-1,334 mg by mouth See admin instructions. Take 2 caps three times a day with meals. Take 1 cap with snacks twice daily.    . cinacalcet (SENSIPAR) 60 MG tablet Take 60 mg by mouth daily.     . clopidogrel (PLAVIX) 75 MG tablet TAKE 1 TABLET BY MOUTH ONCE DAILY **PLEASE  MAKE  APPOINTMENT  FOR  REFILLS** 90 tablet 1  . gabapentin (NEURONTIN) 100 MG capsule Take 1 capsule by mouth every morning and 1 capsule at bedtime. PATIENT NEEDS OFFICE VISIT FOR ADDITIONAL REFILLS (Patient taking differently: Take 100 mg by mouth daily as needed (pain). ) 60 capsule 0  . glucose blood test strip Use to test blood sugar daily. Dx code: 250.02. 100 each 3  . Lancets MISC Use to test blood sugar daily. Dx code 250.02. 100 each 3  . lidocaine-prilocaine (EMLA) cream Apply 1 application topically 3 (three) times a week. Tuesday, Thursday and Saturday  12  . midodrine (PROAMATINE) 5 MG tablet Take 5 mg by mouth 2 (two) times daily.     No current facility-administered medications for this visit.    Allergies:   Tylenol [acetaminophen]    Social History:  The patient  reports that she quit smoking about 51 years ago. Her smoking use included cigarettes. She quit after 10.00 years of use. She has never used smokeless tobacco. She reports previous alcohol use. She reports previous drug use. Drug: Marijuana.   Family History:  The patient's family history includes Depression in her father, mother, and sister; Hypertension in her mother and sister.    ROS: All other systems are reviewed and negative. Unless otherwise mentioned in H&P    PHYSICAL EXAM: VS:   BP (!) 124/58   Pulse 84   Ht _0  (1.626 m)   Wt 174 lb 9.6 oz (79.2 kg)   SpO2 93%   BMI 29.97 kg/m  , BMI Body mass index is 29.97 kg/m. GEN: Well nourished, well developed, in no acute distress HEENT: normal Neck: no JVD, carotid bruits, or masses Cardiac: RRR; 2/6 systolic  murmurs, rubs, or gallops,no edema  Respiratory:  Clear to auscultation bilaterally, normal work of breathing GI: soft, nontender, nondistended, + BS MS: no deformity or atrophy.Right wrist brace in place.  Skin: warm and dry, no rash Neuro:  Strength and sensation are intact Psych: euthymic mood, full affect   EKG:  Not completed this office visit.  Recent Labs: No results found for requested labs within last 8760 hours.  Lipid Panel    Component Value Date/Time   CHOL 275 (H) 07/22/2008 2241   TRIG 211 (H) 07/22/2008 2241   HDL 41 07/22/2008 2241   CHOLHDL 6.7 Ratio 07/22/2008 2241   VLDL 42 (H) 07/22/2008 2241   LDLCALC 192 (H) 07/22/2008 2241      Wt Readings from Last 3 Encounters:  02/08/20 174 lb 9.6 oz (79.2 kg)  01/04/20 174 lb (78.9 kg)  06/22/19 171 lb 3.2 oz (77.7 kg)      Other studies Reviewed: ABI 12/12/2019 Summary:  Right: Resting right ankle-brachial index is within normal range.  No evidence of significant right lower extremity arterial disease. The  right toe-brachial index is abnormal.   Left: Resting left ankle-brachial index indicates noncompressible left  lower extremity arteries.  The left toe-brachial index is abnormal.   ASSESSMENT AND PLAN:  1. PAD: Followed by Dr.Berry with complaints of intermittent claudication She is ready to proceed with PV angiography with Dr.Berry as discussed in his previous note on 01/04/2020. Lower extremity arterial Doppler studies performed in our office on 01/03/2017 revealed ABIs of approximately 0.8 bilaterally with an occluded distal right FSA, and high-grade left SFA stenosis with three-vessel runoff.  The patient  understands that risks include but are not limited to stroke (1 in 1000), death (1 in 89), kidney failure [usually temporary] (1 in 500), bleeding (1 in 200), allergic reaction [possibly serious] (1 in 200), and agrees to proceed.   She is a dialysis patient with dialysis on Tuesdays and Thursdays. She will be scheduled on Monday, March 03, 2020 to have procedure. She will remain on clopidogrel as directed    2. Hypertension: Excellent control of BP. No changes in her regimen.   3. Type II Diabetes: Followed by her PCP.  4. Hyperlipidemia: On high dose statin therapy with atorvastatin.    Current medicines are reviewed at length with the patient today.  I have spent 30 minutes dedicated to the care of this patient on the date of this encounter to include pre-visit review of records, assessment, management and diagnostic testing,orders, with shared decision making.  Labs/ tests ordered today include: PV angiography with Dr. Gwenlyn Found. Pre-procedure labs.   Phill Myron. West Pugh, ANP, AACC   02/08/2020 11:04 AM    [place] [address] Office [phone] Fax [phone]  Notice: This dictation was prepared with Dragon dictation along with smaller phrase technology. Any transcriptional errors that result from this process are unintentional and may not be corrected upon review.

## 2020-02-08 ENCOUNTER — Encounter: Payer: Self-pay | Admitting: Adult Health

## 2020-02-08 ENCOUNTER — Other Ambulatory Visit: Payer: Self-pay | Admitting: Adult Health

## 2020-02-08 ENCOUNTER — Other Ambulatory Visit: Payer: Self-pay

## 2020-02-08 ENCOUNTER — Ambulatory Visit (INDEPENDENT_AMBULATORY_CARE_PROVIDER_SITE_OTHER): Payer: Medicare HMO | Admitting: Adult Health

## 2020-02-08 ENCOUNTER — Ambulatory Visit: Payer: Medicare HMO | Admitting: Adult Health

## 2020-02-08 VITALS — BP 124/58 | HR 84 | Ht 64.0 in | Wt 174.6 lb

## 2020-02-08 DIAGNOSIS — I10 Essential (primary) hypertension: Secondary | ICD-10-CM

## 2020-02-08 DIAGNOSIS — Z992 Dependence on renal dialysis: Secondary | ICD-10-CM

## 2020-02-08 DIAGNOSIS — N186 End stage renal disease: Secondary | ICD-10-CM

## 2020-02-08 DIAGNOSIS — E7800 Pure hypercholesterolemia, unspecified: Secondary | ICD-10-CM | POA: Diagnosis not present

## 2020-02-08 DIAGNOSIS — I739 Peripheral vascular disease, unspecified: Secondary | ICD-10-CM | POA: Diagnosis not present

## 2020-02-08 DIAGNOSIS — Z79899 Other long term (current) drug therapy: Secondary | ICD-10-CM

## 2020-02-08 MED ORDER — ATORVASTATIN CALCIUM 80 MG PO TABS: ORAL_TABLET | ORAL | 3 refills | Status: DC

## 2020-02-08 NOTE — Patient Instructions (Addendum)
Medication Instructions:  Continue current medications  *If you need a refill on your cardiac medications before your next appointment, please call your pharmacy*   Lab Work: [facility] Friday August 27th  If you have labs (blood work) drawn today and your tests are completely normal, you will receive your results only by: Marland Kitchen MyChart Message (if you have MyChart) OR . A paper copy in the mail If you have any lab test that is abnormal or we need to change your treatment, we will call you to review the results.   Testing/Procedures: PV Angiogram   Follow-Up: At [facility], you and your health needs are our priority.  As part of our continuing mission to provide you with exceptional heart care, we have created designated Provider Care Teams.  These Care Teams include your primary Cardiologist (physician) and Advanced Practice Providers (APPs -  Physician Assistants and Nurse Practitioners) who all work together to provide you with the care you need, when you need it.  We recommend signing up for the patient portal called "MyChart".  Sign up information is provided on this After Visit Summary.  MyChart is used to connect with patients for Virtual Visits (Telemedicine).  Patients are able to view lab/test results, encounter notes, upcoming appointments, etc.  Non-urgent messages can be sent to your provider as well.   To learn more about what you can do with MyChart, go to [email or web address].    Your next appointment:   1 month(s)  The format for your next appointment:   In Person  Provider:   You may see [name], MD or one of the following Advanced Practice Providers on your designated Care Team:    Kerin Ransom, PA-C  [place], [place]  Coletta Memos, [place]    Other Instructions    Joanne Carlson [place] [id number] Dept: [place]: [phone]  Joanne Carlson  02/08/2020  You are scheduled for a Peripheral Angiogram on Monday, August 30 with Dr. [name].  1. Please arrive at the [facility] (Main Entrance A) at [facility]: [address] [place], [place] [address] at 7:30 AM (This time is two hours before your procedure to ensure your preparation). Free valet parking service is available.   Special note: Every effort is made to have your procedure done on time. Please understand that emergencies sometimes delay scheduled procedures.  2. Diet: Do not eat solid foods after midnight.  The patient may have clear liquids until 5am upon the day of the procedure.  3. Labs: You will need to have blood drawn on Friday, August 27 at [place]  Open: 8am - 5pm (Lunch 12:30 - 1:30)   Phone: [phone]. You do not need to be fasting.  4. Medication instructions in preparation for your procedure:   Contrast Allergy: No   On the morning of your procedure, take your Aspirin and any morning medicines NOT listed above.  You may use sips of water.  5. Plan for one night stay--bring personal belongings. 6. Bring a current list of your medications and current insurance cards. 7. You MUST have a responsible person to drive you home. 8. Someone MUST be with you the first 24 hours after you arrive home or your discharge will be delayed. 9. Please wear clothes that are easy to get on and off and wear slip-on shoes.  Thank you for allowing [place]  to care for you!   -- [facility]

## 2020-02-09 DIAGNOSIS — N186 End stage renal disease: Secondary | ICD-10-CM | POA: Diagnosis not present

## 2020-02-09 DIAGNOSIS — Z992 Dependence on renal dialysis: Secondary | ICD-10-CM | POA: Diagnosis not present

## 2020-02-09 DIAGNOSIS — N2581 Secondary hyperparathyroidism of renal origin: Secondary | ICD-10-CM | POA: Diagnosis not present

## 2020-02-11 ENCOUNTER — Telehealth: Payer: Self-pay

## 2020-02-11 NOTE — Telephone Encounter (Signed)
Received a message from Dr.Berry advising to have 3 reps present at patient's procedure scheduled 03/03/20 at 9:30 am at [facility] called # [phone] left message on personal voice mail.Sheral Flow called # [phone] left message on personal voice mail.Spoke to Anda Kraft # [phone] advised Dr.Berry request his presence at procedure.

## 2020-02-12 DIAGNOSIS — Z992 Dependence on renal dialysis: Secondary | ICD-10-CM | POA: Diagnosis not present

## 2020-02-12 DIAGNOSIS — N186 End stage renal disease: Secondary | ICD-10-CM | POA: Diagnosis not present

## 2020-02-12 DIAGNOSIS — N2581 Secondary hyperparathyroidism of renal origin: Secondary | ICD-10-CM | POA: Diagnosis not present

## 2020-02-14 DIAGNOSIS — N2581 Secondary hyperparathyroidism of renal origin: Secondary | ICD-10-CM | POA: Diagnosis not present

## 2020-02-14 DIAGNOSIS — N186 End stage renal disease: Secondary | ICD-10-CM | POA: Diagnosis not present

## 2020-02-14 DIAGNOSIS — Z992 Dependence on renal dialysis: Secondary | ICD-10-CM | POA: Diagnosis not present

## 2020-02-16 DIAGNOSIS — Z992 Dependence on renal dialysis: Secondary | ICD-10-CM | POA: Diagnosis not present

## 2020-02-16 DIAGNOSIS — N2581 Secondary hyperparathyroidism of renal origin: Secondary | ICD-10-CM | POA: Diagnosis not present

## 2020-02-16 DIAGNOSIS — N186 End stage renal disease: Secondary | ICD-10-CM | POA: Diagnosis not present

## 2020-02-19 DIAGNOSIS — N2581 Secondary hyperparathyroidism of renal origin: Secondary | ICD-10-CM | POA: Diagnosis not present

## 2020-02-19 DIAGNOSIS — Z992 Dependence on renal dialysis: Secondary | ICD-10-CM | POA: Diagnosis not present

## 2020-02-19 DIAGNOSIS — N186 End stage renal disease: Secondary | ICD-10-CM | POA: Diagnosis not present

## 2020-02-20 DIAGNOSIS — S62611D Displaced fracture of proximal phalanx of left index finger, subsequent encounter for fracture with routine healing: Secondary | ICD-10-CM | POA: Diagnosis not present

## 2020-02-20 DIAGNOSIS — M25642 Stiffness of left hand, not elsewhere classified: Secondary | ICD-10-CM | POA: Diagnosis not present

## 2020-02-20 DIAGNOSIS — S62613D Displaced fracture of proximal phalanx of left middle finger, subsequent encounter for fracture with routine healing: Secondary | ICD-10-CM | POA: Diagnosis not present

## 2020-02-21 DIAGNOSIS — N2581 Secondary hyperparathyroidism of renal origin: Secondary | ICD-10-CM | POA: Diagnosis not present

## 2020-02-21 DIAGNOSIS — N186 End stage renal disease: Secondary | ICD-10-CM | POA: Diagnosis not present

## 2020-02-21 DIAGNOSIS — Z992 Dependence on renal dialysis: Secondary | ICD-10-CM | POA: Diagnosis not present

## 2020-02-22 DIAGNOSIS — M25561 Pain in right knee: Secondary | ICD-10-CM | POA: Diagnosis not present

## 2020-02-22 DIAGNOSIS — M79604 Pain in right leg: Secondary | ICD-10-CM | POA: Diagnosis not present

## 2020-02-22 DIAGNOSIS — M6281 Muscle weakness (generalized): Secondary | ICD-10-CM | POA: Diagnosis not present

## 2020-02-22 DIAGNOSIS — R262 Difficulty in walking, not elsewhere classified: Secondary | ICD-10-CM | POA: Diagnosis not present

## 2020-02-23 DIAGNOSIS — Z992 Dependence on renal dialysis: Secondary | ICD-10-CM | POA: Diagnosis not present

## 2020-02-23 DIAGNOSIS — N2581 Secondary hyperparathyroidism of renal origin: Secondary | ICD-10-CM | POA: Diagnosis not present

## 2020-02-23 DIAGNOSIS — N186 End stage renal disease: Secondary | ICD-10-CM | POA: Diagnosis not present

## 2020-02-26 DIAGNOSIS — N2581 Secondary hyperparathyroidism of renal origin: Secondary | ICD-10-CM | POA: Diagnosis not present

## 2020-02-26 DIAGNOSIS — N186 End stage renal disease: Secondary | ICD-10-CM | POA: Diagnosis not present

## 2020-02-26 DIAGNOSIS — Z992 Dependence on renal dialysis: Secondary | ICD-10-CM | POA: Diagnosis not present

## 2020-02-27 ENCOUNTER — Telehealth: Payer: Self-pay | Admitting: *Deleted

## 2020-02-27 DIAGNOSIS — M6281 Muscle weakness (generalized): Secondary | ICD-10-CM | POA: Diagnosis not present

## 2020-02-27 DIAGNOSIS — M79604 Pain in right leg: Secondary | ICD-10-CM | POA: Diagnosis not present

## 2020-02-27 DIAGNOSIS — R262 Difficulty in walking, not elsewhere classified: Secondary | ICD-10-CM | POA: Diagnosis not present

## 2020-02-27 DIAGNOSIS — M25561 Pain in right knee: Secondary | ICD-10-CM | POA: Diagnosis not present

## 2020-02-27 NOTE — Telephone Encounter (Signed)
-----   Message from Katrine Coho, RN sent at 02/27/2020 10:42 AM EDT ----- Regarding: Abdominal aortogram Monday August 30 Hi ,  Is this patient scheduled for a pre-procedure COVID test?  Thanks, Webb Silversmith

## 2020-02-27 NOTE — Telephone Encounter (Signed)
Call and made pt aware that she is schedule for pre procedure covid test on Friday 8/27 @ 2:15 pm

## 2020-02-28 ENCOUNTER — Other Ambulatory Visit (HOSPITAL_COMMUNITY): Payer: Medicare HMO

## 2020-02-28 ENCOUNTER — Telehealth: Payer: Self-pay | Admitting: *Deleted

## 2020-02-28 DIAGNOSIS — Z992 Dependence on renal dialysis: Secondary | ICD-10-CM | POA: Diagnosis not present

## 2020-02-28 DIAGNOSIS — N186 End stage renal disease: Secondary | ICD-10-CM | POA: Diagnosis not present

## 2020-02-28 DIAGNOSIS — N2581 Secondary hyperparathyroidism of renal origin: Secondary | ICD-10-CM | POA: Diagnosis not present

## 2020-02-28 NOTE — Telephone Encounter (Addendum)
Pt contacted pre-catheterization scheduled at [facility] for: Monday March 03, 2020 9:30 AM Verified arrival time and place: [facility]) at: 7:30 AM   No solid food after midnight prior to cath, clear liquids until 5 AM day of procedure.   AM meds can be  taken pre-cath with sips of water including: ASA 81 mg Plavix 75 mg  Confirmed patient has responsible adult to drive home post procedure and observe 24 hours after arriving home: yes  You are allowed ONE visitor in the waiting room during the time you are at the hospital for your procedure. Both you and your visitor must wear a mask once you enter the hospital.       COVID-19 Pre-Screening Questions:  . In the past 10 days have you had a new cough, shortness of breath, headache, congestion, fever (100 or greater) unexplained body aches, new sore throat, or sudden loss of taste or sense of smell? no . In the past 10 days have you been around anyone with known Covid 19? no . Have you been vaccinated for COVID-19? Yes, see immunization history  Reviewed procedure/mask/visitor instructions, COVID-19 questions with patient.

## 2020-02-29 ENCOUNTER — Other Ambulatory Visit (HOSPITAL_COMMUNITY)
Admission: RE | Admit: 2020-02-29 | Discharge: 2020-02-29 | Disposition: A | Discharge status: Home / Self Care | Payer: Medicare HMO | Source: Ambulatory Visit | Attending: Cardiovascular Disease | Admitting: Cardiovascular Disease

## 2020-02-29 ENCOUNTER — Other Ambulatory Visit (HOSPITAL_COMMUNITY): Payer: Medicare HMO

## 2020-02-29 DIAGNOSIS — Z79899 Other long term (current) drug therapy: Secondary | ICD-10-CM | POA: Diagnosis not present

## 2020-02-29 DIAGNOSIS — Z20822 Contact with and (suspected) exposure to covid-19: Secondary | ICD-10-CM | POA: Insufficient documentation

## 2020-02-29 DIAGNOSIS — Z01812 Encounter for preprocedural laboratory examination: Secondary | ICD-10-CM | POA: Diagnosis not present

## 2020-02-29 LAB — BASIC METABOLIC PANEL
BUN/Creatinine Ratio: 4 — ABNORMAL LOW (ref 12–28)
BUN: 21 mg/dL (ref 8–27)
CO2: 27 mmol/L (ref 20–29)
Calcium: 9.9 mg/dL (ref 8.7–10.3)
Chloride: 92 mmol/L — ABNORMAL LOW (ref 96–106)
Creatinine, Ser: 5.4 mg/dL — ABNORMAL HIGH (ref 0.57–1.00)
GFR calc Af Amer: 8 mL/min/{1.73_m2} — ABNORMAL LOW (ref >59)
GFR calc non Af Amer: 7 mL/min/{1.73_m2} — ABNORMAL LOW (ref >59)
Glucose: 85 mg/dL (ref 65–99)
Potassium: 5.1 mmol/L (ref 3.5–5.2)
Sodium: 137 mmol/L (ref 134–144)

## 2020-02-29 LAB — CBC
Hematocrit: 34.6 % (ref 34.0–46.6)
Hemoglobin: 11.7 g/dL (ref 11.1–15.9)
MCH: 33.1 pg — ABNORMAL HIGH (ref 26.6–33.0)
MCHC: 33.8 g/dL (ref 31.5–35.7)
MCV: 98 fL — ABNORMAL HIGH (ref 79–97)
Platelets: 261 10*3/uL (ref 150–450)
RBC: 3.53 x10E6/uL — ABNORMAL LOW (ref 3.77–5.28)
RDW: 14.2 % (ref 11.7–15.4)
WBC: 8.9 10*3/uL (ref 3.4–10.8)

## 2020-02-29 LAB — SARS CORONAVIRUS 2 (TAT 6-24 HRS): SARS Coronavirus 2: NEGATIVE

## 2020-03-01 DIAGNOSIS — N186 End stage renal disease: Secondary | ICD-10-CM | POA: Diagnosis not present

## 2020-03-01 DIAGNOSIS — Z992 Dependence on renal dialysis: Secondary | ICD-10-CM | POA: Diagnosis not present

## 2020-03-01 DIAGNOSIS — N2581 Secondary hyperparathyroidism of renal origin: Secondary | ICD-10-CM | POA: Diagnosis not present

## 2020-03-03 ENCOUNTER — Other Ambulatory Visit: Payer: Self-pay

## 2020-03-03 ENCOUNTER — Ambulatory Visit (HOSPITAL_COMMUNITY)
Admission: RE | Disposition: A | Discharge status: Home / Self Care | Payer: Medicare HMO | Source: Home / Self Care | Attending: Cardiovascular Disease

## 2020-03-03 ENCOUNTER — Ambulatory Visit (HOSPITAL_COMMUNITY)
Admission: RE | Admit: 2020-03-03 | Discharge: 2020-03-04 | Disposition: A | Discharge status: Home / Self Care | Payer: Medicare HMO | Attending: Cardiovascular Disease | Admitting: Cardiovascular Disease

## 2020-03-03 DIAGNOSIS — E785 Hyperlipidemia, unspecified: Secondary | ICD-10-CM | POA: Diagnosis not present

## 2020-03-03 DIAGNOSIS — N186 End stage renal disease: Secondary | ICD-10-CM | POA: Diagnosis not present

## 2020-03-03 DIAGNOSIS — Z7902 Long term (current) use of antithrombotics/antiplatelets: Secondary | ICD-10-CM | POA: Insufficient documentation

## 2020-03-03 DIAGNOSIS — Z992 Dependence on renal dialysis: Secondary | ICD-10-CM | POA: Diagnosis not present

## 2020-03-03 DIAGNOSIS — I12 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 stage renal disease: Secondary | ICD-10-CM | POA: Diagnosis not present

## 2020-03-03 DIAGNOSIS — Z7982 Long term (current) use of aspirin: Secondary | ICD-10-CM | POA: Diagnosis not present

## 2020-03-03 DIAGNOSIS — Z79899 Other long term (current) drug therapy: Secondary | ICD-10-CM | POA: Insufficient documentation

## 2020-03-03 DIAGNOSIS — E1151 Type 2 diabetes mellitus with diabetic peripheral angiopathy without gangrene: Secondary | ICD-10-CM | POA: Insufficient documentation

## 2020-03-03 DIAGNOSIS — E1122 Type 2 diabetes mellitus with diabetic chronic kidney disease: Secondary | ICD-10-CM | POA: Diagnosis not present

## 2020-03-03 DIAGNOSIS — I70212 Atherosclerosis of native arteries of extremities with intermittent claudication, left leg: Secondary | ICD-10-CM | POA: Insufficient documentation

## 2020-03-03 DIAGNOSIS — I10 Essential (primary) hypertension: Secondary | ICD-10-CM | POA: Diagnosis present

## 2020-03-03 DIAGNOSIS — M109 Gout, unspecified: Secondary | ICD-10-CM | POA: Insufficient documentation

## 2020-03-03 DIAGNOSIS — Z886 Allergy status to analgesic agent status: Secondary | ICD-10-CM | POA: Diagnosis not present

## 2020-03-03 DIAGNOSIS — Z87891 Personal history of nicotine dependence: Secondary | ICD-10-CM | POA: Diagnosis not present

## 2020-03-03 DIAGNOSIS — Z8673 Personal history of transient ischemic attack (TIA), and cerebral infarction without residual deficits: Secondary | ICD-10-CM | POA: Diagnosis not present

## 2020-03-03 DIAGNOSIS — Z794 Long term (current) use of insulin: Secondary | ICD-10-CM

## 2020-03-03 DIAGNOSIS — E1140 Type 2 diabetes mellitus with diabetic neuropathy, unspecified: Secondary | ICD-10-CM | POA: Insufficient documentation

## 2020-03-03 DIAGNOSIS — I70269 Atherosclerosis of native arteries of extremities with gangrene, unspecified extremity: Secondary | ICD-10-CM | POA: Diagnosis present

## 2020-03-03 DIAGNOSIS — E7800 Pure hypercholesterolemia, unspecified: Secondary | ICD-10-CM | POA: Diagnosis present

## 2020-03-03 DIAGNOSIS — I739 Peripheral vascular disease, unspecified: Secondary | ICD-10-CM | POA: Diagnosis present

## 2020-03-03 HISTORY — PX: PERIPHERAL VASCULAR INTERVENTION: CATH118257

## 2020-03-03 HISTORY — PX: ABDOMINAL AORTOGRAM W/LOWER EXTREMITY: CATH118223

## 2020-03-03 LAB — GLUCOSE, CAPILLARY
Glucose-Capillary: 70 mg/dL (ref 70–99)
Glucose-Capillary: 139 mg/dL — ABNORMAL HIGH (ref 70–99)
Glucose-Capillary: 118 mg/dL — ABNORMAL HIGH (ref 70–99)

## 2020-03-03 LAB — HEMOGLOBIN A1C
Hgb A1c MFr Bld: 6.5 % — ABNORMAL HIGH (ref 4.8–5.6)
Mean Plasma Glucose: 139.85 mg/dL

## 2020-03-03 LAB — POCT ACTIVATED CLOTTING TIME
Activated Clotting Time: 235 seconds
Activated Clotting Time: 296 seconds

## 2020-03-03 SURGERY — ABDOMINAL AORTOGRAM W/LOWER EXTREMITY
Anesthesia: LOCAL

## 2020-03-03 MED ORDER — SODIUM CHLORIDE 0.9 % IV SOLN: INTRAVENOUS | Status: DC

## 2020-03-03 MED ORDER — MIDAZOLAM HCL 2 MG/2ML IJ SOLN
INTRAMUSCULAR | Status: AC
  Filled 2020-03-03: qty 2

## 2020-03-03 MED ORDER — HEPARIN (PORCINE) IN NACL 1000-0.9 UT/500ML-% IV SOLN
INTRAVENOUS | Status: AC
  Filled 2020-03-03: qty 1000

## 2020-03-03 MED ORDER — SODIUM CHLORIDE 0.9% FLUSH: 3.0000 mL | INTRAVENOUS | Status: DC | PRN

## 2020-03-03 MED ORDER — HEPARIN SODIUM (PORCINE) 1000 UNIT/ML IJ SOLN
INTRAMUSCULAR | Status: AC
  Filled 2020-03-03: qty 1

## 2020-03-03 MED ORDER — CALCIUM ACETATE (PHOS BINDER) 667 MG PO CAPS
667.0000 mg | ORAL_CAPSULE | Freq: Three times a day (TID) | ORAL | Status: DC
  Administered 2020-03-03 – 2020-03-04 (×4): 667 mg via ORAL
  Filled 2020-03-03 (×4): qty 1

## 2020-03-03 MED ORDER — IODIXANOL 320 MG/ML IV SOLN
INTRAVENOUS | Status: DC | PRN
  Administered 2020-03-03: 115 mL via INTRA_ARTERIAL

## 2020-03-03 MED ORDER — CINACALCET HCL 30 MG PO TABS
60.0000 mg | ORAL_TABLET | Freq: Every day | ORAL | Status: DC
  Administered 2020-03-03 – 2020-03-04 (×2): 60 mg via ORAL
  Filled 2020-03-03 (×3): qty 2

## 2020-03-03 MED ORDER — LIDOCAINE HCL (PF) 1 % IJ SOLN
INTRAMUSCULAR | Status: AC
  Filled 2020-03-03: qty 30

## 2020-03-03 MED ORDER — ASPIRIN EC 81 MG PO TBEC: 81.0000 mg | DELAYED_RELEASE_TABLET | Freq: Every day | ORAL | Status: DC

## 2020-03-03 MED ORDER — LABETALOL HCL 5 MG/ML IV SOLN: 10.0000 mg | INTRAVENOUS | Status: DC | PRN

## 2020-03-03 MED ORDER — SODIUM CHLORIDE 0.9% FLUSH: 3.0000 mL | Freq: Two times a day (BID) | INTRAVENOUS | Status: DC

## 2020-03-03 MED ORDER — SODIUM CHLORIDE 0.9 % IV SOLN
INTRAVENOUS | Status: AC | PRN
  Administered 2020-03-03: 10 mL/h via INTRAVENOUS

## 2020-03-03 MED ORDER — ATORVASTATIN CALCIUM 80 MG PO TABS
80.0000 mg | ORAL_TABLET | Freq: Every day | ORAL | Status: DC
  Administered 2020-03-03 – 2020-03-04 (×2): 80 mg via ORAL
  Filled 2020-03-03 (×2): qty 1

## 2020-03-03 MED ORDER — SODIUM CHLORIDE 0.9 % IV SOLN: 250.0000 mL | INTRAVENOUS | Status: DC | PRN

## 2020-03-03 MED ORDER — HYDRALAZINE HCL 20 MG/ML IJ SOLN: 5.0000 mg | INTRAMUSCULAR | Status: DC | PRN

## 2020-03-03 MED ORDER — ACETAMINOPHEN 325 MG PO TABS: 650.0000 mg | ORAL_TABLET | ORAL | Status: DC | PRN

## 2020-03-03 MED ORDER — ASPIRIN EC 81 MG PO TBEC
81.0000 mg | DELAYED_RELEASE_TABLET | Freq: Every day | ORAL | Status: DC
  Administered 2020-03-03 – 2020-03-04 (×2): 81 mg via ORAL
  Filled 2020-03-03 (×2): qty 1

## 2020-03-03 MED ORDER — FENTANYL CITRATE (PF) 100 MCG/2ML IJ SOLN
INTRAMUSCULAR | Status: DC | PRN
Start: 2020-03-03 — End: 2020-03-03
  Administered 2020-03-03: 25 ug via INTRAVENOUS

## 2020-03-03 MED ORDER — ONDANSETRON HCL 4 MG/2ML IJ SOLN: 4.0000 mg | Freq: Four times a day (QID) | INTRAMUSCULAR | Status: DC | PRN

## 2020-03-03 MED ORDER — FENTANYL CITRATE (PF) 100 MCG/2ML IJ SOLN
INTRAMUSCULAR | Status: AC
  Filled 2020-03-03: qty 2

## 2020-03-03 MED ORDER — HEPARIN SODIUM (PORCINE) 1000 UNIT/ML IJ SOLN
INTRAMUSCULAR | Status: DC | PRN
  Administered 2020-03-03: 9000 [IU] via INTRAVENOUS
  Administered 2020-03-03: 4000 [IU] via INTRAVENOUS

## 2020-03-03 MED ORDER — CLOPIDOGREL BISULFATE 75 MG PO TABS
75.0000 mg | ORAL_TABLET | Freq: Every day | ORAL | Status: DC
  Administered 2020-03-04: 75 mg via ORAL
  Filled 2020-03-03: qty 1

## 2020-03-03 MED ORDER — MORPHINE SULFATE (PF) 2 MG/ML IV SOLN: 2.0000 mg | INTRAVENOUS | Status: DC | PRN

## 2020-03-03 MED ORDER — MIDAZOLAM HCL 2 MG/2ML IJ SOLN
INTRAMUSCULAR | Status: DC | PRN
  Administered 2020-03-03: 1 mg via INTRAVENOUS

## 2020-03-03 MED ORDER — INSULIN ASPART 100 UNIT/ML ~~LOC~~ SOLN
0.0000 [IU] | Freq: Three times a day (TID) | SUBCUTANEOUS | Status: DC
  Administered 2020-03-03 – 2020-03-04 (×2): 2 [IU] via SUBCUTANEOUS

## 2020-03-03 MED ORDER — LIDOCAINE HCL (PF) 1 % IJ SOLN
INTRAMUSCULAR | Status: DC | PRN
  Administered 2020-03-03: 15 mL via SUBCUTANEOUS

## 2020-03-03 MED ORDER — CLOPIDOGREL BISULFATE 75 MG PO TABS: 75.0000 mg | ORAL_TABLET | Freq: Every day | ORAL | Status: DC

## 2020-03-03 MED ORDER — SODIUM CHLORIDE 0.9% FLUSH
3.0000 mL | Freq: Two times a day (BID) | INTRAVENOUS | Status: DC
  Administered 2020-03-03 – 2020-03-04 (×2): 3 mL via INTRAVENOUS

## 2020-03-03 SURGICAL SUPPLY — 30 items
BALLN ADMIRAL INPACT 5X200 (BALLOONS) ×3
BALLN MUSTANG 8.0X40 75 (BALLOONS) ×3
BALLOON ADMIRAL INPACT 5X200 (BALLOONS) IMPLANT
BALLOON MUSTANG 8.0X40 75 (BALLOONS) IMPLANT
CATH ANGIO 5F PIGTAIL 65CM (CATHETERS) ×1 IMPLANT
CATH CROSS OVER TEMPO 5F (CATHETERS) ×1 IMPLANT
CATH SHOCKWAVE 5.0X60 (CATHETERS) ×1 IMPLANT
CATH STRAIGHT 5FR 65CM (CATHETERS) ×1 IMPLANT
DCB RANGER 5.0X100 135 (BALLOONS) IMPLANT
DEVICE CLOSURE PERCLS PRGLD 6F (VASCULAR PRODUCTS) IMPLANT
GUIDEWIRE ANGLED .035X150CM (WIRE) ×1 IMPLANT
GUIDEWIRE ZILIENT 6G 014 (WIRE) ×1 IMPLANT
KIT PV (KITS) ×3 IMPLANT
PERCLOSE PROGLIDE 6F (VASCULAR PRODUCTS) ×3
RANGER DCB 5.0X100 135 (BALLOONS) ×3
SHEATH FLEXOR ANSEL 1 7F 45CM (SHEATH) ×1 IMPLANT
SHEATH PINNACLE 5F 10CM (SHEATH) ×1 IMPLANT
SHEATH PINNACLE 7F 10CM (SHEATH) ×1 IMPLANT
SHEATH PROBE COVER 6X72 (BAG) ×2 IMPLANT
STENT ABSOLUTE PRO 10.0X80X135 (Permanent Stent) ×1 IMPLANT
STOPCOCK MORSE 400PSI 3WAY (MISCELLANEOUS) ×1 IMPLANT
SYR MEDRAD MARK 7 150ML (SYRINGE) ×3 IMPLANT
TAPE VIPERTRACK RADIOPAQ (MISCELLANEOUS) IMPLANT
TAPE VIPERTRACK RADIOPAQUE (MISCELLANEOUS) ×3
TRANSDUCER W/STOPCOCK (MISCELLANEOUS) ×3 IMPLANT
TRAY PV CATH (CUSTOM PROCEDURE TRAY) ×3 IMPLANT
TUBING CIL FLEX 10 FLL-RA (TUBING) ×1 IMPLANT
WIRE HI TORQ VERSACORE J 260CM (WIRE) ×1 IMPLANT
WIRE HITORQ VERSACORE ST 145CM (WIRE) ×2 IMPLANT
WIRE ROSEN-J .035X180CM (WIRE) ×1 IMPLANT

## 2020-03-03 NOTE — Interval H&P Note (Signed)
History and Physical Interval Note:  03/03/2020 10:23 AM  Joanne Carlson  has presented today for surgery, with the diagnosis of abnormal ABI's.  The various methods of treatment have been discussed with the patient and family. After consideration of risks, benefits and other options for treatment, the patient has consented to  Procedure(s): ABDOMINAL AORTOGRAM W/LOWER EXTREMITY (N/A) as a surgical intervention.  The patient's history has been reviewed, patient examined, no change in status, stable for surgery.  I have reviewed the patient's chart and labs.  Questions were answered to the patient's satisfaction.     [name]

## 2020-03-03 NOTE — Progress Notes (Signed)
Patient arrived from cath lab to 4E room 3 after abdominal aortogram w/Berry.  Telemetry monitor applied and CCMD notified.  Skin assessment and CHG bath completed.  Patient oriented to unit and room to include call light and phone.  Will continue to monitor.

## 2020-03-04 ENCOUNTER — Encounter (HOSPITAL_COMMUNITY): Payer: Self-pay | Admitting: Cardiovascular Disease

## 2020-03-04 DIAGNOSIS — E1151 Type 2 diabetes mellitus with diabetic peripheral angiopathy without gangrene: Secondary | ICD-10-CM | POA: Diagnosis not present

## 2020-03-04 DIAGNOSIS — Z992 Dependence on renal dialysis: Secondary | ICD-10-CM | POA: Diagnosis not present

## 2020-03-04 DIAGNOSIS — E7800 Pure hypercholesterolemia, unspecified: Secondary | ICD-10-CM | POA: Diagnosis not present

## 2020-03-04 DIAGNOSIS — N186 End stage renal disease: Secondary | ICD-10-CM | POA: Diagnosis not present

## 2020-03-04 DIAGNOSIS — I739 Peripheral vascular disease, unspecified: Secondary | ICD-10-CM

## 2020-03-04 DIAGNOSIS — I12 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 stage renal disease: Secondary | ICD-10-CM | POA: Diagnosis not present

## 2020-03-04 DIAGNOSIS — E1122 Type 2 diabetes mellitus with diabetic chronic kidney disease: Secondary | ICD-10-CM | POA: Diagnosis not present

## 2020-03-04 DIAGNOSIS — I70261 Atherosclerosis of native arteries of extremities with gangrene, right leg: Secondary | ICD-10-CM

## 2020-03-04 DIAGNOSIS — I10 Essential (primary) hypertension: Secondary | ICD-10-CM | POA: Diagnosis not present

## 2020-03-04 DIAGNOSIS — E785 Hyperlipidemia, unspecified: Secondary | ICD-10-CM | POA: Diagnosis not present

## 2020-03-04 DIAGNOSIS — E1140 Type 2 diabetes mellitus with diabetic neuropathy, unspecified: Secondary | ICD-10-CM | POA: Diagnosis not present

## 2020-03-04 DIAGNOSIS — M109 Gout, unspecified: Secondary | ICD-10-CM | POA: Diagnosis not present

## 2020-03-04 DIAGNOSIS — I70212 Atherosclerosis of native arteries of extremities with intermittent claudication, left leg: Secondary | ICD-10-CM | POA: Diagnosis not present

## 2020-03-04 LAB — GLUCOSE, CAPILLARY
Glucose-Capillary: 94 mg/dL (ref 70–99)
Glucose-Capillary: 138 mg/dL — ABNORMAL HIGH (ref 70–99)
Glucose-Capillary: 104 mg/dL — ABNORMAL HIGH (ref 70–99)
Glucose-Capillary: 66 mg/dL — ABNORMAL LOW (ref 70–99)

## 2020-03-04 LAB — CBC
HCT: 32.7 % — ABNORMAL LOW (ref 36.0–46.0)
Hemoglobin: 10.2 g/dL — ABNORMAL LOW (ref 12.0–15.0)
MCH: 31.8 pg (ref 26.0–34.0)
MCHC: 31.2 g/dL (ref 30.0–36.0)
MCV: 101.9 fL — ABNORMAL HIGH (ref 80.0–100.0)
Platelets: 206 10*3/uL (ref 150–400)
RBC: 3.21 MIL/uL — ABNORMAL LOW (ref 3.87–5.11)
RDW: 15.2 % (ref 11.5–15.5)
WBC: 10.2 10*3/uL (ref 4.0–10.5)
nRBC: 0 % (ref 0.0–0.2)

## 2020-03-04 LAB — BASIC METABOLIC PANEL
Anion gap: 14 (ref 5–15)
BUN: 42 mg/dL — ABNORMAL HIGH (ref 8–23)
CO2: 27 mmol/L (ref 22–32)
Calcium: 9.2 mg/dL (ref 8.9–10.3)
Chloride: 96 mmol/L — ABNORMAL LOW (ref 98–111)
Creatinine, Ser: 8.99 mg/dL — ABNORMAL HIGH (ref 0.44–1.00)
GFR calc Af Amer: 4 mL/min — ABNORMAL LOW (ref >60)
GFR calc non Af Amer: 4 mL/min — ABNORMAL LOW (ref >60)
Glucose, Bld: 87 mg/dL (ref 70–99)
Potassium: 5.2 mmol/L — ABNORMAL HIGH (ref 3.5–5.1)
Sodium: 137 mmol/L (ref 135–145)

## 2020-03-04 MED ORDER — CHLORHEXIDINE GLUCONATE CLOTH 2 % EX PADS: 6.0000 | MEDICATED_PAD | Freq: Every day | CUTANEOUS | Status: DC

## 2020-03-04 MED FILL — Heparin Sod (Porcine)-NaCl IV Soln 1000 Unit/500ML-0.9%: INTRAVENOUS | Qty: 1000 | Status: AC

## 2020-03-04 NOTE — Discharge Summary (Signed)
Discharge Summary    Patient ID: Joanne Carlson MRN: [id number]; DOB: Jan 04, 1946  Admit date: 03/03/2020 Discharge date: 03/04/2020  Primary Care Provider: Vernie Shanks, MD  Primary Cardiologist: [name], MD  Primary Electrophysiologist:  None   Discharge Diagnoses    Principal Problem:   Claudication in peripheral vascular disease [facility]) Active Problems:   Type 2 diabetes mellitus without complication, with long-term current use of insulin ([place])   HYPERCHOLESTEROLEMIA   HYPERTENSION, BENIGN   End stage renal disease ([place])   Atherosclerosis of native arteries of the extremities with gangrene [facility])   Diagnostic Studies/Procedures    Peripheral vascular procedure 03/03/20 Angiographic Data:  1: Abdominal aorta-widely patent 2: Left lower extremity-30% proximal left extra iliac artery stenosis, long diffuse segmental calcified proximal and mid left SFA stenosis in the 80-90 range.  Three-vessel runoff with the anterior tibial being the dominant vessel 3: Right lower extremity-scattered mid and distal 50% SFA stenoses with two-vessel runoff.  The posterior tibial is occluded  IMPRESSION: Ms. Toutant right SFA has remained widely patent.  She has a diffusely diseased proximal mid segmental calcified left SFA stenosis and claudication.  We will proceed with percutaneous intervention.   Final Impression: Successful PTA and stenting of a iatrogenic dissection involving the left external iliac artery, shockwave angioplasty followed by [facility] of a diffusely diseased segmental proximal mid left SFA with an excellent distal pulse at the end of procedure.  Perclose was deployed successfully achieving hemostasis.]  The patient overnight, gently hydrate her and arrange for her to undergo hemodialysis tomorrow morning after which she can be discharged home.  We will get lower extremity arterial Doppler studies in our [facility] line office next week and I will see her back the week after in  follow-up.  She left the lab in stable condition.  _____________   History of Present Illness     Joanne Carlson is a 74 y.o. female with HTN, DM2, HLD, PVD, chronic renal insufficiency on HD (T/TH/Sa) who was admitted for PV procedure. The patient had a small nonhealing ulcer on her left great toe treated with antibiotics recently. This was slowly healing. Lower extremity arterial dopplers studies on 01/04/20 revealed ABIs of approximately 0.8 bilaterally with occluded distal right FSA, and high-grade left SFA stenosis with 3-vessel runoff. Patient was seen 01/04/20 and right great toe had healed.  Patient also noted left calf claudication when she exercised on a treadmill almost daily. At that time she wished to wait on peripheral vascular angiography as she wanted her right hand to heal. She was seen back in clinic 02/08/20 for PV scheduling.   Hospital Course     Consultants: Nephrology  The patient was brought in 03/03/20 for her scheduled procedure. Right sided femoral access established via [place]. Angiography showed widely patent abdominal aorta, right SFA widely patent, diffusely diseased proximal and mid segmental calcified left SFA stenosis in the 80-90% range and claudication. Right showed scattered mid and distal 50% SFA stenosis with two-vessel runoff with posterior tibial is occluded. She was treated with successful PTA and stenting of an iatrogenic dissection involving the left external iliac artery, shockwave angioplasty followed by [facility] of diffusely diseased segmental proximal mid left SFA with an excellent distal pulse at the end of procedure. Perclose was successfully deployed achieving hemostasis. The patient was kept overnight for hydration and hemodialysis the next day, for which nephrology was consulted. Right groin cath site remained stable with no tenderness, hematoma or significant bruising. Labs came back  with Hgb at 10.2. Plan to continue home plavix and aspirin daily. Plan for lower  extremity dopplers next week and follow-up with Dr. Gwenlyn Found.   The patient was evaluated by Dr. Oval Linsey and felt to be stable for discharge.   Did the patient have an acute coronary syndrome (MI, NSTEMI, STEMI, etc) this admission?:  No                               Did the patient have a percutaneous coronary intervention (stent / angioplasty)?:  No.   _____________  Discharge Vitals Blood pressure (!) 111/53, pulse 82, temperature (!) 97.5 F (36.4 C), temperature source Oral, resp. rate 17, height 5' 4"  (1.626 m), weight 77.7 kg, SpO2 100 %.  Filed Weights   03/03/20 0829 03/04/20 0400  Weight: 77.1 kg 77.7 kg    Labs & Radiologic Studies    CBC Recent Labs    03/04/20 0133  WBC 10.2  HGB 10.2*  HCT 32.7*  MCV 101.9*  PLT 878   Basic Metabolic Panel Recent Labs    03/04/20 0133  NA 137  K 5.2*  CL 96*  CO2 27  GLUCOSE 87  BUN 42*  CREATININE 8.99*  CALCIUM 9.2   Liver Function Tests No results for input(s): AST, ALT, ALKPHOS, BILITOT, PROT, ALBUMIN in the last 72 hours. No results for input(s): LIPASE, AMYLASE in the last 72 hours. High Sensitivity Troponin:   No results for input(s): TROPONINIHS in the last 720 hours.  BNP Invalid input(s): POCBNP D-Dimer No results for input(s): DDIMER in the last 72 hours. Hemoglobin A1C Recent Labs    03/03/20 1923  HGBA1C 6.5*   Fasting Lipid Panel No results for input(s): CHOL, HDL, LDLCALC, TRIG, CHOLHDL, LDLDIRECT in the last 72 hours. Thyroid Function Tests No results for input(s): TSH, T4TOTAL, T3FREE, THYROIDAB in the last 72 hours.  Invalid input(s): FREET3 _____________  PERIPHERAL VASCULAR CATHETERIZATION  Result Date: 03/03/2020  [id number] LOCATION:  FACILITY: [facility] PHYSICIAN: [name], M.D. [birth year] DATE OF PROCEDURE:  03/03/2020 DATE OF DISCHARGE: PV Angiogram/Intervention History obtained from chart review.Joanne Carlson is a 34 y.o.  mother of 2 children referred by Dr. Amalia Hailey for peripheral  vascular evaluation because of critical limb ischemia. I last spoke to her for telemedicine virtual telephone visit 12/13/2018. She has a history of treated hypertension, diabetes and hyperlipidemia. 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 Dr. Adele Barthel placed an AV fistula. She's never had a heart attack or stroke. She denies chest pain or shortness of breath. She's had a small non-healing ulcer on her left great toe treated with antibiotics recently and according to her this is slowly healing. Lower extremity arterial Doppler studies performed in our office 01/03/17 revealed ABIs approximately 0.8 bilaterally with an occluded distal right SFA and high-grade left SFA stenosis with three-vessel runoff.  She had developed a nonhealing wound on her right great toe. She is currently on hemodialysis. I angiogram to her 01/30/2018 revealing a 99% calcified mid right SFA stenosis. I performed chocolate balloon atherectomy followed by drug-eluting balloon angioplasty with excellent result. Dopplers improved. Her woundultimately healed.  Since I spoke to her a year ago the wound on her right great toe has healed and her Dopplers performed 12/11/2018 suggest a patent right SFA. She denies chest pain or shortness of breath.   She does notice left calf claudication when she exercises on the  treadmill on a daily basis.  She did have a high-grade calcified lesion in the mid left SFA at the time of angiography 01/30/2018.   Because of lifestyle limiting claudication.  She elected to proceed with outpatient angiography and endovascular therapy. Pre Procedure Diagnosis: Peripheral arterial disease Post Procedure Diagnosis: Peripheral arterial disease Operators: Dr. [name] Procedures Performed:  1.  Ultrasound-guided right common femoral access  2.  Contralateral access (secondary catheter placement)  3.  PTA and self-expanding stent left extra iliac artery  4.  Shockwave balloon  angioplasty/DCB left SFA  5.  Right common femoral angiogram, Perclose PROCEDURE DESCRIPTION: The patient was brought to the second floor [place] [facility] cath lab in the the postabsorptive state. She was premedicated with IV Versed and fentanyl. Her right groin was prepped and shaved in usual sterile fashion. Xylocaine 1% was used for local anesthesia. A 5 French sheath was inserted into the right common femoral artery using standard Seldinger technique.  Ultrasound was used to identify the right common femoral artery and a digital image of access was obtained and placed in patient's chart.  A 5 French pigtail catheters placed the distal abdominal aorta.  Distal abdominal aortography, bilateral iliac angiography with bifemoral runoff was performed using bolus chase, digital subtraction and step table technique.  Omnipaque dye was used for the entirety of the case.  Retrograde aortic pressures monitored in the case.  Angiographic Data: 1: Abdominal aorta-widely patent 2: Left lower extremity-30% proximal left extra iliac artery stenosis, long diffuse segmental calcified proximal and mid left SFA stenosis in the 80-90 range.  Three-vessel runoff with the anterior tibial being the dominant vessel 3: Right lower extremity-scattered mid and distal 50% SFA stenoses with two-vessel runoff.  The posterior tibial is occluded   Ms. Lall's right SFA has remained widely patent.  She has a diffusely diseased proximal mid segmental calcified left SFA stenosis and claudication.  We will proceed with percutaneous intervention. Procedure Description: Contralateral access was obtained with a versa core wire and 7 [place] 45 cm multipurpose Ansell sheath.  Patient did receive a total of 13,000's of heparin with an ACT of 296.  Total contrast administered to the patient was 115 cc.  For an unknown reason the left external leg artery dissected.  I was able to cross this with a Glidewire and exchanged for a versa core wire.  I then  performed stenting using a 10 mm x 80 mm long Abbott nitinol self-expanding stent sealing the dissection plane and providing antegrade flow.  I postdilated with 8 mm x 4 cm Maverick balloons.  I then crossed the diffusely diseased proximal mid left SFA disease with a 0.14/300 cm long 6 g Zilient wire.  I performed shockwave angioplasty with a 5 mm x 6 cm shockwave balloon and multiple areas 1st-2 and then at 4 atm.  There was definite angiographic yielding of the balloon suggesting therapeutic shockwave effect.  I then performed DCB using a 5 mm x 200 cm impact Admiral balloon at 4 atm for 2 and half minutes.  Unfortunately, it did not reach beyond a certain point and therefore I needed to do overlapping DCB inflations with a 5 mm x 100 mm long Ranger balloon.  The entire diseased segment was reduced to less than 30% residual without dissection.  There was a strong pedal pulse at the end of the procedure.  The sheath was withdrawn across the bifurcation and exchanged over an 035 wire for short 7 French sheath.  A  Perclose hemostasis device was then successfully deployed after an angiogram was performed on the right common femoral artery.  Patient was already on aspirin Plavix. Final Impression: Successful PTA and stenting of a iatrogenic dissection involving the left external iliac artery, shockwave angioplasty followed by [facility] of a diffusely diseased segmental proximal mid left SFA with an excellent distal pulse at the end of procedure.  Perclose was deployed successfully achieving hemostasis.]  The patient overnight, gently hydrate her and arrange for her to undergo hemodialysis tomorrow morning after which she can be discharged home.  We will get lower extremity arterial Doppler studies in our [facility] line office next week and I will see her back the week after in follow-up.  She left the lab in stable condition. [name]. MD, [facility] 03/03/2020 12:36 PM   Disposition   Pt is being discharged home today in  good condition.  Follow-up Plans & Appointments     Follow-up Information    Lorretta Harp, MD Follow up on 03/19/2020.   Specialties: Cardiology, Radiology Why: @ 10:45AM Contact information: [address] [place] [address] [phone]                Discharge Medications   Allergies as of 03/04/2020      Reactions   Tylenol [acetaminophen] Rash      Medication List    TAKE these medications   Accu-Chek Aviva Plus w/Device Kit   allopurinol 100 MG tablet Commonly known as: ZYLOPRIM Take 200 mg by mouth daily.   aspirin EC 81 MG tablet Take 81 mg by mouth daily.   atorvastatin 80 MG tablet Commonly known as: LIPITOR TAKE 1 TABLET BY MOUTH ONCE DAILY AT  6  PM What changed:   how much to take  how to take this  when to take this  additional instructions   b complex-vitamin c-folic acid 0.8 MG Tabs tablet Take 1 tablet by mouth daily.   calcium acetate 667 MG capsule Commonly known as: PHOSLO Take 667 mg by mouth 3 (three) times daily with meals.   cinacalcet 60 MG tablet Commonly known as: SENSIPAR Take 60 mg by mouth daily.   clopidogrel 75 MG tablet Commonly known as: PLAVIX TAKE 1 TABLET BY MOUTH ONCE DAILY **PLEASE  MAKE  APPOINTMENT  FOR  REFILLS** What changed: See the new instructions.   gabapentin 100 MG capsule Commonly known as: NEURONTIN Take 1 capsule by mouth every morning and 1 capsule at bedtime. PATIENT NEEDS OFFICE VISIT FOR ADDITIONAL REFILLS What changed:   how much to take  how to take this  when to take this  additional instructions   glucose blood test strip Use to test blood sugar daily. Dx code: 84.02.   Lancets Misc Use to test blood sugar daily. Dx code 250.02.   lidocaine-prilocaine cream Commonly known as: EMLA Apply 1 application topically Every Tuesday,Thursday,and Saturday with dialysis.   midodrine 10 MG tablet Commonly known as: PROAMATINE Take 10 mg by mouth Every  Tuesday,Thursday,and Saturday with dialysis.   predniSONE 10 MG tablet Commonly known as: DELTASONE Take 10 mg by mouth daily with breakfast.          Outstanding Labs/Studies   Lower extremity dopplers 03/12/20  Duration of Discharge Encounter   Greater than 30 minutes including physician time.  Signed, Tora Prunty Ninfa Meeker, PA-C 03/04/2020, 3:37 PM

## 2020-03-04 NOTE — Progress Notes (Signed)
Discharge instructions provided to patient. Groin site care and follow-up appointments reviewed.  All questions answered. IV's removed. Patient to be escorted home by son.

## 2020-03-06 DIAGNOSIS — Z992 Dependence on renal dialysis: Secondary | ICD-10-CM | POA: Diagnosis not present

## 2020-03-06 DIAGNOSIS — N2581 Secondary hyperparathyroidism of renal origin: Secondary | ICD-10-CM | POA: Diagnosis not present

## 2020-03-06 DIAGNOSIS — N186 End stage renal disease: Secondary | ICD-10-CM | POA: Diagnosis not present

## 2020-03-07 DIAGNOSIS — M25642 Stiffness of left hand, not elsewhere classified: Secondary | ICD-10-CM | POA: Diagnosis not present

## 2020-03-08 DIAGNOSIS — N2581 Secondary hyperparathyroidism of renal origin: Secondary | ICD-10-CM | POA: Diagnosis not present

## 2020-03-08 DIAGNOSIS — Z992 Dependence on renal dialysis: Secondary | ICD-10-CM | POA: Diagnosis not present

## 2020-03-08 DIAGNOSIS — N186 End stage renal disease: Secondary | ICD-10-CM | POA: Diagnosis not present

## 2020-03-11 ENCOUNTER — Other Ambulatory Visit (HOSPITAL_COMMUNITY): Payer: Self-pay | Admitting: Cardiovascular Disease

## 2020-03-11 DIAGNOSIS — N186 End stage renal disease: Secondary | ICD-10-CM | POA: Diagnosis not present

## 2020-03-11 DIAGNOSIS — I7772 Dissection of iliac artery: Secondary | ICD-10-CM

## 2020-03-11 DIAGNOSIS — Z95828 Presence of other vascular implants and grafts: Secondary | ICD-10-CM

## 2020-03-11 DIAGNOSIS — N2581 Secondary hyperparathyroidism of renal origin: Secondary | ICD-10-CM | POA: Diagnosis not present

## 2020-03-11 DIAGNOSIS — Z992 Dependence on renal dialysis: Secondary | ICD-10-CM | POA: Diagnosis not present

## 2020-03-12 ENCOUNTER — Ambulatory Visit (HOSPITAL_BASED_OUTPATIENT_CLINIC_OR_DEPARTMENT_OTHER)
Admission: RE | Admit: 2020-03-12 | Discharge: 2020-03-12 | Disposition: A | Discharge status: Home / Self Care | Payer: Medicare HMO | Source: Ambulatory Visit | Attending: Cardiovascular Disease | Admitting: Cardiovascular Disease

## 2020-03-12 ENCOUNTER — Other Ambulatory Visit: Payer: Self-pay

## 2020-03-12 ENCOUNTER — Ambulatory Visit (HOSPITAL_COMMUNITY)
Admission: RE | Admit: 2020-03-12 | Discharge: 2020-03-12 | Disposition: A | Discharge status: Home / Self Care | Payer: Medicare HMO | Source: Ambulatory Visit | Attending: Cardiovascular Disease | Admitting: Cardiovascular Disease

## 2020-03-12 ENCOUNTER — Other Ambulatory Visit (HOSPITAL_COMMUNITY): Payer: Self-pay | Admitting: Cardiovascular Disease

## 2020-03-12 DIAGNOSIS — I7772 Dissection of iliac artery: Secondary | ICD-10-CM

## 2020-03-12 DIAGNOSIS — Z9862 Peripheral vascular angioplasty status: Secondary | ICD-10-CM

## 2020-03-12 DIAGNOSIS — I70229 Atherosclerosis of native arteries of extremities with rest pain, unspecified extremity: Secondary | ICD-10-CM

## 2020-03-12 DIAGNOSIS — Z95828 Presence of other vascular implants and grafts: Secondary | ICD-10-CM | POA: Insufficient documentation

## 2020-03-12 DIAGNOSIS — M25642 Stiffness of left hand, not elsewhere classified: Secondary | ICD-10-CM | POA: Diagnosis not present

## 2020-03-12 DIAGNOSIS — I739 Peripheral vascular disease, unspecified: Secondary | ICD-10-CM | POA: Diagnosis not present

## 2020-03-13 ENCOUNTER — Telehealth: Payer: Self-pay

## 2020-03-13 DIAGNOSIS — Z992 Dependence on renal dialysis: Secondary | ICD-10-CM | POA: Diagnosis not present

## 2020-03-13 DIAGNOSIS — N186 End stage renal disease: Secondary | ICD-10-CM | POA: Diagnosis not present

## 2020-03-13 DIAGNOSIS — N2581 Secondary hyperparathyroidism of renal origin: Secondary | ICD-10-CM | POA: Diagnosis not present

## 2020-03-13 NOTE — Telephone Encounter (Addendum)
Left voice message for the patient to give our office a call for lab results.  ----- Message from Lendon Colonel, NP sent at 03/05/2020  4:04 PM EDT ----- Abnormalities in the labs are related to her renal insufficiency.  She is to follow up with nephrology for ongoing management.   KL

## 2020-03-14 DIAGNOSIS — M810 Age-related osteoporosis without current pathological fracture: Secondary | ICD-10-CM | POA: Diagnosis not present

## 2020-03-14 DIAGNOSIS — N2581 Secondary hyperparathyroidism of renal origin: Secondary | ICD-10-CM | POA: Diagnosis not present

## 2020-03-14 DIAGNOSIS — I10 Essential (primary) hypertension: Secondary | ICD-10-CM | POA: Diagnosis not present

## 2020-03-14 DIAGNOSIS — N185 Chronic kidney disease, stage 5: Secondary | ICD-10-CM | POA: Diagnosis not present

## 2020-03-14 DIAGNOSIS — E1140 Type 2 diabetes mellitus with diabetic neuropathy, unspecified: Secondary | ICD-10-CM | POA: Diagnosis not present

## 2020-03-14 DIAGNOSIS — E785 Hyperlipidemia, unspecified: Secondary | ICD-10-CM | POA: Diagnosis not present

## 2020-03-14 DIAGNOSIS — E119 Type 2 diabetes mellitus without complications: Secondary | ICD-10-CM | POA: Diagnosis not present

## 2020-03-14 DIAGNOSIS — E118 Type 2 diabetes mellitus with unspecified complications: Secondary | ICD-10-CM | POA: Diagnosis not present

## 2020-03-15 DIAGNOSIS — N186 End stage renal disease: Secondary | ICD-10-CM | POA: Diagnosis not present

## 2020-03-15 DIAGNOSIS — Z992 Dependence on renal dialysis: Secondary | ICD-10-CM | POA: Diagnosis not present

## 2020-03-15 DIAGNOSIS — N2581 Secondary hyperparathyroidism of renal origin: Secondary | ICD-10-CM | POA: Diagnosis not present

## 2020-03-17 ENCOUNTER — Telehealth: Payer: Self-pay | Admitting: *Deleted

## 2020-03-17 ENCOUNTER — Encounter: Payer: Self-pay | Admitting: Neurology

## 2020-03-17 ENCOUNTER — Ambulatory Visit: Payer: Medicare HMO | Admitting: Neurology

## 2020-03-17 NOTE — Telephone Encounter (Signed)
No showed new patient appointment. 

## 2020-03-18 ENCOUNTER — Telehealth: Payer: Self-pay | Admitting: Neurology

## 2020-03-18 DIAGNOSIS — N2581 Secondary hyperparathyroidism of renal origin: Secondary | ICD-10-CM | POA: Diagnosis not present

## 2020-03-18 DIAGNOSIS — N186 End stage renal disease: Secondary | ICD-10-CM | POA: Diagnosis not present

## 2020-03-18 DIAGNOSIS — Z992 Dependence on renal dialysis: Secondary | ICD-10-CM | POA: Diagnosis not present

## 2020-03-18 NOTE — Telephone Encounter (Signed)
This is FYI, @ 12:37 yesterday pt left a voicemail stating she would miss her appointment because Social Service did not set up her transportation.  Pt found this out with too short of a notice to attempt to try and catch the city bus to [facility].   A voicemail was left for pt to call and r/s her appointment.

## 2020-03-19 ENCOUNTER — Encounter: Payer: Self-pay | Admitting: *Deleted

## 2020-03-19 ENCOUNTER — Ambulatory Visit: Payer: Medicare HMO | Admitting: Cardiovascular Disease

## 2020-03-19 DIAGNOSIS — S62613D Displaced fracture of proximal phalanx of left middle finger, subsequent encounter for fracture with routine healing: Secondary | ICD-10-CM | POA: Diagnosis not present

## 2020-03-19 DIAGNOSIS — M79642 Pain in left hand: Secondary | ICD-10-CM | POA: Diagnosis not present

## 2020-03-19 DIAGNOSIS — S62611D Displaced fracture of proximal phalanx of left index finger, subsequent encounter for fracture with routine healing: Secondary | ICD-10-CM | POA: Diagnosis not present

## 2020-03-19 DIAGNOSIS — M25531 Pain in right wrist: Secondary | ICD-10-CM | POA: Diagnosis not present

## 2020-03-20 DIAGNOSIS — N2581 Secondary hyperparathyroidism of renal origin: Secondary | ICD-10-CM | POA: Diagnosis not present

## 2020-03-20 DIAGNOSIS — N186 End stage renal disease: Secondary | ICD-10-CM | POA: Diagnosis not present

## 2020-03-20 DIAGNOSIS — Z992 Dependence on renal dialysis: Secondary | ICD-10-CM | POA: Diagnosis not present

## 2020-03-21 DIAGNOSIS — M25642 Stiffness of left hand, not elsewhere classified: Secondary | ICD-10-CM | POA: Diagnosis not present

## 2020-03-22 DIAGNOSIS — Z992 Dependence on renal dialysis: Secondary | ICD-10-CM | POA: Diagnosis not present

## 2020-03-22 DIAGNOSIS — N186 End stage renal disease: Secondary | ICD-10-CM | POA: Diagnosis not present

## 2020-03-22 DIAGNOSIS — N2581 Secondary hyperparathyroidism of renal origin: Secondary | ICD-10-CM | POA: Diagnosis not present

## 2020-03-25 DIAGNOSIS — N2581 Secondary hyperparathyroidism of renal origin: Secondary | ICD-10-CM | POA: Diagnosis not present

## 2020-03-25 DIAGNOSIS — Z992 Dependence on renal dialysis: Secondary | ICD-10-CM | POA: Diagnosis not present

## 2020-03-25 DIAGNOSIS — N186 End stage renal disease: Secondary | ICD-10-CM | POA: Diagnosis not present

## 2020-03-27 DIAGNOSIS — N2581 Secondary hyperparathyroidism of renal origin: Secondary | ICD-10-CM | POA: Diagnosis not present

## 2020-03-27 DIAGNOSIS — Z992 Dependence on renal dialysis: Secondary | ICD-10-CM | POA: Diagnosis not present

## 2020-03-27 DIAGNOSIS — N186 End stage renal disease: Secondary | ICD-10-CM | POA: Diagnosis not present

## 2020-03-28 DIAGNOSIS — I770 Arteriovenous fistula, acquired: Secondary | ICD-10-CM | POA: Diagnosis not present

## 2020-03-28 DIAGNOSIS — Z905 Acquired absence of kidney: Secondary | ICD-10-CM | POA: Diagnosis not present

## 2020-03-28 DIAGNOSIS — N2581 Secondary hyperparathyroidism of renal origin: Secondary | ICD-10-CM | POA: Diagnosis not present

## 2020-03-28 DIAGNOSIS — E785 Hyperlipidemia, unspecified: Secondary | ICD-10-CM | POA: Diagnosis not present

## 2020-03-28 DIAGNOSIS — N185 Chronic kidney disease, stage 5: Secondary | ICD-10-CM | POA: Diagnosis not present

## 2020-03-28 DIAGNOSIS — Z992 Dependence on renal dialysis: Secondary | ICD-10-CM | POA: Diagnosis not present

## 2020-03-28 DIAGNOSIS — Z23 Encounter for immunization: Secondary | ICD-10-CM | POA: Diagnosis not present

## 2020-03-28 DIAGNOSIS — E1140 Type 2 diabetes mellitus with diabetic neuropathy, unspecified: Secondary | ICD-10-CM | POA: Diagnosis not present

## 2020-03-28 DIAGNOSIS — I10 Essential (primary) hypertension: Secondary | ICD-10-CM | POA: Diagnosis not present

## 2020-03-29 DIAGNOSIS — Z992 Dependence on renal dialysis: Secondary | ICD-10-CM | POA: Diagnosis not present

## 2020-03-29 DIAGNOSIS — N186 End stage renal disease: Secondary | ICD-10-CM | POA: Diagnosis not present

## 2020-03-29 DIAGNOSIS — N2581 Secondary hyperparathyroidism of renal origin: Secondary | ICD-10-CM | POA: Diagnosis not present

## 2020-04-01 DIAGNOSIS — N186 End stage renal disease: Secondary | ICD-10-CM | POA: Diagnosis not present

## 2020-04-01 DIAGNOSIS — N2581 Secondary hyperparathyroidism of renal origin: Secondary | ICD-10-CM | POA: Diagnosis not present

## 2020-04-01 DIAGNOSIS — Z992 Dependence on renal dialysis: Secondary | ICD-10-CM | POA: Diagnosis not present

## 2020-04-02 DIAGNOSIS — M25642 Stiffness of left hand, not elsewhere classified: Secondary | ICD-10-CM | POA: Diagnosis not present

## 2020-04-03 DIAGNOSIS — N2581 Secondary hyperparathyroidism of renal origin: Secondary | ICD-10-CM | POA: Diagnosis not present

## 2020-04-03 DIAGNOSIS — E1122 Type 2 diabetes mellitus with diabetic chronic kidney disease: Secondary | ICD-10-CM | POA: Diagnosis not present

## 2020-04-03 DIAGNOSIS — Z992 Dependence on renal dialysis: Secondary | ICD-10-CM | POA: Diagnosis not present

## 2020-04-03 DIAGNOSIS — N186 End stage renal disease: Secondary | ICD-10-CM | POA: Diagnosis not present

## 2020-04-04 DIAGNOSIS — E1122 Type 2 diabetes mellitus with diabetic chronic kidney disease: Secondary | ICD-10-CM | POA: Diagnosis not present

## 2020-04-04 DIAGNOSIS — R262 Difficulty in walking, not elsewhere classified: Secondary | ICD-10-CM | POA: Diagnosis not present

## 2020-04-04 DIAGNOSIS — Z992 Dependence on renal dialysis: Secondary | ICD-10-CM | POA: Diagnosis not present

## 2020-04-04 DIAGNOSIS — M25561 Pain in right knee: Secondary | ICD-10-CM | POA: Diagnosis not present

## 2020-04-04 DIAGNOSIS — N186 End stage renal disease: Secondary | ICD-10-CM | POA: Diagnosis not present

## 2020-04-04 DIAGNOSIS — I70219 Atherosclerosis of native arteries of extremities with intermittent claudication, unspecified extremity: Secondary | ICD-10-CM | POA: Diagnosis not present

## 2020-04-04 DIAGNOSIS — M6281 Muscle weakness (generalized): Secondary | ICD-10-CM | POA: Diagnosis not present

## 2020-04-04 DIAGNOSIS — M79604 Pain in right leg: Secondary | ICD-10-CM | POA: Diagnosis not present

## 2020-04-05 DIAGNOSIS — N2581 Secondary hyperparathyroidism of renal origin: Secondary | ICD-10-CM | POA: Diagnosis not present

## 2020-04-05 DIAGNOSIS — Z992 Dependence on renal dialysis: Secondary | ICD-10-CM | POA: Diagnosis not present

## 2020-04-05 DIAGNOSIS — N186 End stage renal disease: Secondary | ICD-10-CM | POA: Diagnosis not present

## 2020-04-07 DIAGNOSIS — R69 Illness, unspecified: Secondary | ICD-10-CM | POA: Diagnosis not present

## 2020-04-08 DIAGNOSIS — N2581 Secondary hyperparathyroidism of renal origin: Secondary | ICD-10-CM | POA: Diagnosis not present

## 2020-04-08 DIAGNOSIS — N186 End stage renal disease: Secondary | ICD-10-CM | POA: Diagnosis not present

## 2020-04-08 DIAGNOSIS — Z992 Dependence on renal dialysis: Secondary | ICD-10-CM | POA: Diagnosis not present

## 2020-04-09 DIAGNOSIS — M25561 Pain in right knee: Secondary | ICD-10-CM | POA: Diagnosis not present

## 2020-04-09 DIAGNOSIS — M6281 Muscle weakness (generalized): Secondary | ICD-10-CM | POA: Diagnosis not present

## 2020-04-09 DIAGNOSIS — R262 Difficulty in walking, not elsewhere classified: Secondary | ICD-10-CM | POA: Diagnosis not present

## 2020-04-09 DIAGNOSIS — M79604 Pain in right leg: Secondary | ICD-10-CM | POA: Diagnosis not present

## 2020-04-10 DIAGNOSIS — N186 End stage renal disease: Secondary | ICD-10-CM | POA: Diagnosis not present

## 2020-04-10 DIAGNOSIS — Z992 Dependence on renal dialysis: Secondary | ICD-10-CM | POA: Diagnosis not present

## 2020-04-10 DIAGNOSIS — N2581 Secondary hyperparathyroidism of renal origin: Secondary | ICD-10-CM | POA: Diagnosis not present

## 2020-04-11 DIAGNOSIS — M25642 Stiffness of left hand, not elsewhere classified: Secondary | ICD-10-CM | POA: Diagnosis not present

## 2020-04-12 DIAGNOSIS — Z992 Dependence on renal dialysis: Secondary | ICD-10-CM | POA: Diagnosis not present

## 2020-04-12 DIAGNOSIS — N186 End stage renal disease: Secondary | ICD-10-CM | POA: Diagnosis not present

## 2020-04-12 DIAGNOSIS — N2581 Secondary hyperparathyroidism of renal origin: Secondary | ICD-10-CM | POA: Diagnosis not present

## 2020-04-15 DIAGNOSIS — N2581 Secondary hyperparathyroidism of renal origin: Secondary | ICD-10-CM | POA: Diagnosis not present

## 2020-04-15 DIAGNOSIS — Z992 Dependence on renal dialysis: Secondary | ICD-10-CM | POA: Diagnosis not present

## 2020-04-15 DIAGNOSIS — N186 End stage renal disease: Secondary | ICD-10-CM | POA: Diagnosis not present

## 2020-04-16 DIAGNOSIS — M25642 Stiffness of left hand, not elsewhere classified: Secondary | ICD-10-CM | POA: Diagnosis not present

## 2020-04-17 DIAGNOSIS — N186 End stage renal disease: Secondary | ICD-10-CM | POA: Diagnosis not present

## 2020-04-17 DIAGNOSIS — Z992 Dependence on renal dialysis: Secondary | ICD-10-CM | POA: Diagnosis not present

## 2020-04-17 DIAGNOSIS — N2581 Secondary hyperparathyroidism of renal origin: Secondary | ICD-10-CM | POA: Diagnosis not present

## 2020-04-18 DIAGNOSIS — M25561 Pain in right knee: Secondary | ICD-10-CM | POA: Diagnosis not present

## 2020-04-18 DIAGNOSIS — R262 Difficulty in walking, not elsewhere classified: Secondary | ICD-10-CM | POA: Diagnosis not present

## 2020-04-18 DIAGNOSIS — M79604 Pain in right leg: Secondary | ICD-10-CM | POA: Diagnosis not present

## 2020-04-18 DIAGNOSIS — M6281 Muscle weakness (generalized): Secondary | ICD-10-CM | POA: Diagnosis not present

## 2020-04-19 DIAGNOSIS — N186 End stage renal disease: Secondary | ICD-10-CM | POA: Diagnosis not present

## 2020-04-19 DIAGNOSIS — Z992 Dependence on renal dialysis: Secondary | ICD-10-CM | POA: Diagnosis not present

## 2020-04-19 DIAGNOSIS — N2581 Secondary hyperparathyroidism of renal origin: Secondary | ICD-10-CM | POA: Diagnosis not present

## 2020-04-21 DIAGNOSIS — M25561 Pain in right knee: Secondary | ICD-10-CM | POA: Diagnosis not present

## 2020-04-21 DIAGNOSIS — M79604 Pain in right leg: Secondary | ICD-10-CM | POA: Diagnosis not present

## 2020-04-21 DIAGNOSIS — R262 Difficulty in walking, not elsewhere classified: Secondary | ICD-10-CM | POA: Diagnosis not present

## 2020-04-21 DIAGNOSIS — M6281 Muscle weakness (generalized): Secondary | ICD-10-CM | POA: Diagnosis not present

## 2020-04-22 DIAGNOSIS — N186 End stage renal disease: Secondary | ICD-10-CM | POA: Diagnosis not present

## 2020-04-22 DIAGNOSIS — Z992 Dependence on renal dialysis: Secondary | ICD-10-CM | POA: Diagnosis not present

## 2020-04-22 DIAGNOSIS — N2581 Secondary hyperparathyroidism of renal origin: Secondary | ICD-10-CM | POA: Diagnosis not present

## 2020-04-24 DIAGNOSIS — N186 End stage renal disease: Secondary | ICD-10-CM | POA: Diagnosis not present

## 2020-04-24 DIAGNOSIS — Z992 Dependence on renal dialysis: Secondary | ICD-10-CM | POA: Diagnosis not present

## 2020-04-24 DIAGNOSIS — N2581 Secondary hyperparathyroidism of renal origin: Secondary | ICD-10-CM | POA: Diagnosis not present

## 2020-04-25 DIAGNOSIS — M79604 Pain in right leg: Secondary | ICD-10-CM | POA: Diagnosis not present

## 2020-04-25 DIAGNOSIS — R262 Difficulty in walking, not elsewhere classified: Secondary | ICD-10-CM | POA: Diagnosis not present

## 2020-04-25 DIAGNOSIS — M6281 Muscle weakness (generalized): Secondary | ICD-10-CM | POA: Diagnosis not present

## 2020-04-25 DIAGNOSIS — M25561 Pain in right knee: Secondary | ICD-10-CM | POA: Diagnosis not present

## 2020-04-26 DIAGNOSIS — N2581 Secondary hyperparathyroidism of renal origin: Secondary | ICD-10-CM | POA: Diagnosis not present

## 2020-04-26 DIAGNOSIS — N186 End stage renal disease: Secondary | ICD-10-CM | POA: Diagnosis not present

## 2020-04-26 DIAGNOSIS — Z992 Dependence on renal dialysis: Secondary | ICD-10-CM | POA: Diagnosis not present

## 2020-04-28 DIAGNOSIS — M25561 Pain in right knee: Secondary | ICD-10-CM | POA: Diagnosis not present

## 2020-04-28 DIAGNOSIS — M6281 Muscle weakness (generalized): Secondary | ICD-10-CM | POA: Diagnosis not present

## 2020-04-28 DIAGNOSIS — R262 Difficulty in walking, not elsewhere classified: Secondary | ICD-10-CM | POA: Diagnosis not present

## 2020-04-28 DIAGNOSIS — M79604 Pain in right leg: Secondary | ICD-10-CM | POA: Diagnosis not present

## 2020-04-29 DIAGNOSIS — Z992 Dependence on renal dialysis: Secondary | ICD-10-CM | POA: Diagnosis not present

## 2020-04-29 DIAGNOSIS — N2581 Secondary hyperparathyroidism of renal origin: Secondary | ICD-10-CM | POA: Diagnosis not present

## 2020-04-29 DIAGNOSIS — N186 End stage renal disease: Secondary | ICD-10-CM | POA: Diagnosis not present

## 2020-04-30 DIAGNOSIS — M25642 Stiffness of left hand, not elsewhere classified: Secondary | ICD-10-CM | POA: Diagnosis not present

## 2020-05-01 DIAGNOSIS — N186 End stage renal disease: Secondary | ICD-10-CM | POA: Diagnosis not present

## 2020-05-01 DIAGNOSIS — Z992 Dependence on renal dialysis: Secondary | ICD-10-CM | POA: Diagnosis not present

## 2020-05-01 DIAGNOSIS — N2581 Secondary hyperparathyroidism of renal origin: Secondary | ICD-10-CM | POA: Diagnosis not present

## 2020-05-03 DIAGNOSIS — N186 End stage renal disease: Secondary | ICD-10-CM | POA: Diagnosis not present

## 2020-05-03 DIAGNOSIS — N2581 Secondary hyperparathyroidism of renal origin: Secondary | ICD-10-CM | POA: Diagnosis not present

## 2020-05-03 DIAGNOSIS — Z992 Dependence on renal dialysis: Secondary | ICD-10-CM | POA: Diagnosis not present

## 2020-05-04 DIAGNOSIS — E1122 Type 2 diabetes mellitus with diabetic chronic kidney disease: Secondary | ICD-10-CM | POA: Diagnosis not present

## 2020-05-04 DIAGNOSIS — Z992 Dependence on renal dialysis: Secondary | ICD-10-CM | POA: Diagnosis not present

## 2020-05-04 DIAGNOSIS — N186 End stage renal disease: Secondary | ICD-10-CM | POA: Diagnosis not present

## 2020-05-05 DIAGNOSIS — M25561 Pain in right knee: Secondary | ICD-10-CM | POA: Diagnosis not present

## 2020-05-05 DIAGNOSIS — M6281 Muscle weakness (generalized): Secondary | ICD-10-CM | POA: Diagnosis not present

## 2020-05-05 DIAGNOSIS — R262 Difficulty in walking, not elsewhere classified: Secondary | ICD-10-CM | POA: Diagnosis not present

## 2020-05-05 DIAGNOSIS — M79604 Pain in right leg: Secondary | ICD-10-CM | POA: Diagnosis not present

## 2020-05-06 DIAGNOSIS — Z992 Dependence on renal dialysis: Secondary | ICD-10-CM | POA: Diagnosis not present

## 2020-05-06 DIAGNOSIS — N186 End stage renal disease: Secondary | ICD-10-CM | POA: Diagnosis not present

## 2020-05-06 DIAGNOSIS — N2581 Secondary hyperparathyroidism of renal origin: Secondary | ICD-10-CM | POA: Diagnosis not present

## 2020-05-07 DIAGNOSIS — E118 Type 2 diabetes mellitus with unspecified complications: Secondary | ICD-10-CM | POA: Diagnosis not present

## 2020-05-07 DIAGNOSIS — M25561 Pain in right knee: Secondary | ICD-10-CM | POA: Diagnosis not present

## 2020-05-07 DIAGNOSIS — R262 Difficulty in walking, not elsewhere classified: Secondary | ICD-10-CM | POA: Diagnosis not present

## 2020-05-07 DIAGNOSIS — G8929 Other chronic pain: Secondary | ICD-10-CM | POA: Diagnosis not present

## 2020-05-07 DIAGNOSIS — M79604 Pain in right leg: Secondary | ICD-10-CM | POA: Diagnosis not present

## 2020-05-07 DIAGNOSIS — E785 Hyperlipidemia, unspecified: Secondary | ICD-10-CM | POA: Diagnosis not present

## 2020-05-07 DIAGNOSIS — M810 Age-related osteoporosis without current pathological fracture: Secondary | ICD-10-CM | POA: Diagnosis not present

## 2020-05-07 DIAGNOSIS — N185 Chronic kidney disease, stage 5: Secondary | ICD-10-CM | POA: Diagnosis not present

## 2020-05-07 DIAGNOSIS — N2581 Secondary hyperparathyroidism of renal origin: Secondary | ICD-10-CM | POA: Diagnosis not present

## 2020-05-07 DIAGNOSIS — E1140 Type 2 diabetes mellitus with diabetic neuropathy, unspecified: Secondary | ICD-10-CM | POA: Diagnosis not present

## 2020-05-07 DIAGNOSIS — M6281 Muscle weakness (generalized): Secondary | ICD-10-CM | POA: Diagnosis not present

## 2020-05-07 DIAGNOSIS — I10 Essential (primary) hypertension: Secondary | ICD-10-CM | POA: Diagnosis not present

## 2020-05-07 DIAGNOSIS — E119 Type 2 diabetes mellitus without complications: Secondary | ICD-10-CM | POA: Diagnosis not present

## 2020-05-08 DIAGNOSIS — N2581 Secondary hyperparathyroidism of renal origin: Secondary | ICD-10-CM | POA: Diagnosis not present

## 2020-05-08 DIAGNOSIS — N186 End stage renal disease: Secondary | ICD-10-CM | POA: Diagnosis not present

## 2020-05-08 DIAGNOSIS — Z992 Dependence on renal dialysis: Secondary | ICD-10-CM | POA: Diagnosis not present

## 2020-05-09 DIAGNOSIS — M6281 Muscle weakness (generalized): Secondary | ICD-10-CM | POA: Diagnosis not present

## 2020-05-09 DIAGNOSIS — M79604 Pain in right leg: Secondary | ICD-10-CM | POA: Diagnosis not present

## 2020-05-09 DIAGNOSIS — R262 Difficulty in walking, not elsewhere classified: Secondary | ICD-10-CM | POA: Diagnosis not present

## 2020-05-09 DIAGNOSIS — M25561 Pain in right knee: Secondary | ICD-10-CM | POA: Diagnosis not present

## 2020-05-10 DIAGNOSIS — N186 End stage renal disease: Secondary | ICD-10-CM | POA: Diagnosis not present

## 2020-05-10 DIAGNOSIS — N2581 Secondary hyperparathyroidism of renal origin: Secondary | ICD-10-CM | POA: Diagnosis not present

## 2020-05-10 DIAGNOSIS — Z992 Dependence on renal dialysis: Secondary | ICD-10-CM | POA: Diagnosis not present

## 2020-05-12 DIAGNOSIS — M25561 Pain in right knee: Secondary | ICD-10-CM | POA: Diagnosis not present

## 2020-05-12 DIAGNOSIS — M6281 Muscle weakness (generalized): Secondary | ICD-10-CM | POA: Diagnosis not present

## 2020-05-12 DIAGNOSIS — M79604 Pain in right leg: Secondary | ICD-10-CM | POA: Diagnosis not present

## 2020-05-12 DIAGNOSIS — R262 Difficulty in walking, not elsewhere classified: Secondary | ICD-10-CM | POA: Diagnosis not present

## 2020-05-13 DIAGNOSIS — N186 End stage renal disease: Secondary | ICD-10-CM | POA: Diagnosis not present

## 2020-05-13 DIAGNOSIS — N2581 Secondary hyperparathyroidism of renal origin: Secondary | ICD-10-CM | POA: Diagnosis not present

## 2020-05-13 DIAGNOSIS — Z992 Dependence on renal dialysis: Secondary | ICD-10-CM | POA: Diagnosis not present

## 2020-05-14 DIAGNOSIS — M6281 Muscle weakness (generalized): Secondary | ICD-10-CM | POA: Diagnosis not present

## 2020-05-14 DIAGNOSIS — M25561 Pain in right knee: Secondary | ICD-10-CM | POA: Diagnosis not present

## 2020-05-14 DIAGNOSIS — M79604 Pain in right leg: Secondary | ICD-10-CM | POA: Diagnosis not present

## 2020-05-14 DIAGNOSIS — R262 Difficulty in walking, not elsewhere classified: Secondary | ICD-10-CM | POA: Diagnosis not present

## 2020-05-15 DIAGNOSIS — N2581 Secondary hyperparathyroidism of renal origin: Secondary | ICD-10-CM | POA: Diagnosis not present

## 2020-05-15 DIAGNOSIS — Z992 Dependence on renal dialysis: Secondary | ICD-10-CM | POA: Diagnosis not present

## 2020-05-15 DIAGNOSIS — N186 End stage renal disease: Secondary | ICD-10-CM | POA: Diagnosis not present

## 2020-05-17 DIAGNOSIS — N2581 Secondary hyperparathyroidism of renal origin: Secondary | ICD-10-CM | POA: Diagnosis not present

## 2020-05-17 DIAGNOSIS — N186 End stage renal disease: Secondary | ICD-10-CM | POA: Diagnosis not present

## 2020-05-17 DIAGNOSIS — Z992 Dependence on renal dialysis: Secondary | ICD-10-CM | POA: Diagnosis not present

## 2020-05-19 ENCOUNTER — Other Ambulatory Visit: Payer: Self-pay | Admitting: Cardiovascular Disease

## 2020-05-19 DIAGNOSIS — R262 Difficulty in walking, not elsewhere classified: Secondary | ICD-10-CM | POA: Diagnosis not present

## 2020-05-19 DIAGNOSIS — M79604 Pain in right leg: Secondary | ICD-10-CM | POA: Diagnosis not present

## 2020-05-19 DIAGNOSIS — M25561 Pain in right knee: Secondary | ICD-10-CM | POA: Diagnosis not present

## 2020-05-19 DIAGNOSIS — M6281 Muscle weakness (generalized): Secondary | ICD-10-CM | POA: Diagnosis not present

## 2020-05-19 NOTE — Telephone Encounter (Signed)
Rx has been sent to the pharmacy electronically. ° °

## 2020-05-20 DIAGNOSIS — N186 End stage renal disease: Secondary | ICD-10-CM | POA: Diagnosis not present

## 2020-05-20 DIAGNOSIS — Z992 Dependence on renal dialysis: Secondary | ICD-10-CM | POA: Diagnosis not present

## 2020-05-20 DIAGNOSIS — N2581 Secondary hyperparathyroidism of renal origin: Secondary | ICD-10-CM | POA: Diagnosis not present

## 2020-05-21 DIAGNOSIS — H5203 Hypermetropia, bilateral: Secondary | ICD-10-CM | POA: Diagnosis not present

## 2020-05-22 DIAGNOSIS — N2581 Secondary hyperparathyroidism of renal origin: Secondary | ICD-10-CM | POA: Diagnosis not present

## 2020-05-22 DIAGNOSIS — Z992 Dependence on renal dialysis: Secondary | ICD-10-CM | POA: Diagnosis not present

## 2020-05-22 DIAGNOSIS — N186 End stage renal disease: Secondary | ICD-10-CM | POA: Diagnosis not present

## 2020-05-24 DIAGNOSIS — N2581 Secondary hyperparathyroidism of renal origin: Secondary | ICD-10-CM | POA: Diagnosis not present

## 2020-05-24 DIAGNOSIS — N186 End stage renal disease: Secondary | ICD-10-CM | POA: Diagnosis not present

## 2020-05-24 DIAGNOSIS — Z992 Dependence on renal dialysis: Secondary | ICD-10-CM | POA: Diagnosis not present

## 2020-05-26 DIAGNOSIS — N2581 Secondary hyperparathyroidism of renal origin: Secondary | ICD-10-CM | POA: Diagnosis not present

## 2020-05-26 DIAGNOSIS — N186 End stage renal disease: Secondary | ICD-10-CM | POA: Diagnosis not present

## 2020-05-26 DIAGNOSIS — Z992 Dependence on renal dialysis: Secondary | ICD-10-CM | POA: Diagnosis not present

## 2020-05-28 DIAGNOSIS — N2581 Secondary hyperparathyroidism of renal origin: Secondary | ICD-10-CM | POA: Diagnosis not present

## 2020-05-28 DIAGNOSIS — N186 End stage renal disease: Secondary | ICD-10-CM | POA: Diagnosis not present

## 2020-05-28 DIAGNOSIS — Z992 Dependence on renal dialysis: Secondary | ICD-10-CM | POA: Diagnosis not present

## 2020-05-30 DIAGNOSIS — M6281 Muscle weakness (generalized): Secondary | ICD-10-CM | POA: Diagnosis not present

## 2020-05-30 DIAGNOSIS — M79604 Pain in right leg: Secondary | ICD-10-CM | POA: Diagnosis not present

## 2020-05-30 DIAGNOSIS — R262 Difficulty in walking, not elsewhere classified: Secondary | ICD-10-CM | POA: Diagnosis not present

## 2020-05-30 DIAGNOSIS — M25561 Pain in right knee: Secondary | ICD-10-CM | POA: Diagnosis not present

## 2020-05-31 DIAGNOSIS — N186 End stage renal disease: Secondary | ICD-10-CM | POA: Diagnosis not present

## 2020-05-31 DIAGNOSIS — Z992 Dependence on renal dialysis: Secondary | ICD-10-CM | POA: Diagnosis not present

## 2020-05-31 DIAGNOSIS — N2581 Secondary hyperparathyroidism of renal origin: Secondary | ICD-10-CM | POA: Diagnosis not present

## 2020-06-02 DIAGNOSIS — I871 Compression of vein: Secondary | ICD-10-CM | POA: Diagnosis not present

## 2020-06-02 DIAGNOSIS — Z992 Dependence on renal dialysis: Secondary | ICD-10-CM | POA: Diagnosis not present

## 2020-06-02 DIAGNOSIS — T82858A Stenosis of vascular prosthetic devices, implants and grafts, initial encounter: Secondary | ICD-10-CM | POA: Diagnosis not present

## 2020-06-02 DIAGNOSIS — N186 End stage renal disease: Secondary | ICD-10-CM | POA: Diagnosis not present

## 2020-06-03 DIAGNOSIS — E1122 Type 2 diabetes mellitus with diabetic chronic kidney disease: Secondary | ICD-10-CM | POA: Diagnosis not present

## 2020-06-03 DIAGNOSIS — Z992 Dependence on renal dialysis: Secondary | ICD-10-CM | POA: Diagnosis not present

## 2020-06-03 DIAGNOSIS — N186 End stage renal disease: Secondary | ICD-10-CM | POA: Diagnosis not present

## 2020-06-03 DIAGNOSIS — N2581 Secondary hyperparathyroidism of renal origin: Secondary | ICD-10-CM | POA: Diagnosis not present

## 2020-06-05 DIAGNOSIS — Z992 Dependence on renal dialysis: Secondary | ICD-10-CM | POA: Diagnosis not present

## 2020-06-05 DIAGNOSIS — N2581 Secondary hyperparathyroidism of renal origin: Secondary | ICD-10-CM | POA: Diagnosis not present

## 2020-06-05 DIAGNOSIS — N186 End stage renal disease: Secondary | ICD-10-CM | POA: Diagnosis not present

## 2020-06-07 DIAGNOSIS — N2581 Secondary hyperparathyroidism of renal origin: Secondary | ICD-10-CM | POA: Diagnosis not present

## 2020-06-07 DIAGNOSIS — Z992 Dependence on renal dialysis: Secondary | ICD-10-CM | POA: Diagnosis not present

## 2020-06-07 DIAGNOSIS — N186 End stage renal disease: Secondary | ICD-10-CM | POA: Diagnosis not present

## 2020-06-09 DIAGNOSIS — M6281 Muscle weakness (generalized): Secondary | ICD-10-CM | POA: Diagnosis not present

## 2020-06-09 DIAGNOSIS — R262 Difficulty in walking, not elsewhere classified: Secondary | ICD-10-CM | POA: Diagnosis not present

## 2020-06-09 DIAGNOSIS — M79604 Pain in right leg: Secondary | ICD-10-CM | POA: Diagnosis not present

## 2020-06-09 DIAGNOSIS — M25561 Pain in right knee: Secondary | ICD-10-CM | POA: Diagnosis not present

## 2020-06-10 DIAGNOSIS — N2581 Secondary hyperparathyroidism of renal origin: Secondary | ICD-10-CM | POA: Diagnosis not present

## 2020-06-10 DIAGNOSIS — Z992 Dependence on renal dialysis: Secondary | ICD-10-CM | POA: Diagnosis not present

## 2020-06-10 DIAGNOSIS — N186 End stage renal disease: Secondary | ICD-10-CM | POA: Diagnosis not present

## 2020-06-11 DIAGNOSIS — M25561 Pain in right knee: Secondary | ICD-10-CM | POA: Diagnosis not present

## 2020-06-11 DIAGNOSIS — M6281 Muscle weakness (generalized): Secondary | ICD-10-CM | POA: Diagnosis not present

## 2020-06-11 DIAGNOSIS — M79604 Pain in right leg: Secondary | ICD-10-CM | POA: Diagnosis not present

## 2020-06-11 DIAGNOSIS — R262 Difficulty in walking, not elsewhere classified: Secondary | ICD-10-CM | POA: Diagnosis not present

## 2020-06-12 DIAGNOSIS — N2581 Secondary hyperparathyroidism of renal origin: Secondary | ICD-10-CM | POA: Diagnosis not present

## 2020-06-12 DIAGNOSIS — N186 End stage renal disease: Secondary | ICD-10-CM | POA: Diagnosis not present

## 2020-06-12 DIAGNOSIS — Z992 Dependence on renal dialysis: Secondary | ICD-10-CM | POA: Diagnosis not present

## 2020-06-14 DIAGNOSIS — N2581 Secondary hyperparathyroidism of renal origin: Secondary | ICD-10-CM | POA: Diagnosis not present

## 2020-06-14 DIAGNOSIS — Z992 Dependence on renal dialysis: Secondary | ICD-10-CM | POA: Diagnosis not present

## 2020-06-14 DIAGNOSIS — N186 End stage renal disease: Secondary | ICD-10-CM | POA: Diagnosis not present

## 2020-06-16 DIAGNOSIS — M25561 Pain in right knee: Secondary | ICD-10-CM | POA: Diagnosis not present

## 2020-06-16 DIAGNOSIS — M79604 Pain in right leg: Secondary | ICD-10-CM | POA: Diagnosis not present

## 2020-06-16 DIAGNOSIS — M6281 Muscle weakness (generalized): Secondary | ICD-10-CM | POA: Diagnosis not present

## 2020-06-16 DIAGNOSIS — R262 Difficulty in walking, not elsewhere classified: Secondary | ICD-10-CM | POA: Diagnosis not present

## 2020-06-17 DIAGNOSIS — N2581 Secondary hyperparathyroidism of renal origin: Secondary | ICD-10-CM | POA: Diagnosis not present

## 2020-06-17 DIAGNOSIS — N186 End stage renal disease: Secondary | ICD-10-CM | POA: Diagnosis not present

## 2020-06-17 DIAGNOSIS — Z992 Dependence on renal dialysis: Secondary | ICD-10-CM | POA: Diagnosis not present

## 2020-06-19 DIAGNOSIS — N186 End stage renal disease: Secondary | ICD-10-CM | POA: Diagnosis not present

## 2020-06-19 DIAGNOSIS — N2581 Secondary hyperparathyroidism of renal origin: Secondary | ICD-10-CM | POA: Diagnosis not present

## 2020-06-19 DIAGNOSIS — Z992 Dependence on renal dialysis: Secondary | ICD-10-CM | POA: Diagnosis not present

## 2020-06-20 DIAGNOSIS — M6281 Muscle weakness (generalized): Secondary | ICD-10-CM | POA: Diagnosis not present

## 2020-06-20 DIAGNOSIS — M25561 Pain in right knee: Secondary | ICD-10-CM | POA: Diagnosis not present

## 2020-06-20 DIAGNOSIS — R262 Difficulty in walking, not elsewhere classified: Secondary | ICD-10-CM | POA: Diagnosis not present

## 2020-06-20 DIAGNOSIS — M79604 Pain in right leg: Secondary | ICD-10-CM | POA: Diagnosis not present

## 2020-06-21 DIAGNOSIS — N2581 Secondary hyperparathyroidism of renal origin: Secondary | ICD-10-CM | POA: Diagnosis not present

## 2020-06-21 DIAGNOSIS — N186 End stage renal disease: Secondary | ICD-10-CM | POA: Diagnosis not present

## 2020-06-21 DIAGNOSIS — Z992 Dependence on renal dialysis: Secondary | ICD-10-CM | POA: Diagnosis not present

## 2020-06-23 DIAGNOSIS — R262 Difficulty in walking, not elsewhere classified: Secondary | ICD-10-CM | POA: Diagnosis not present

## 2020-06-23 DIAGNOSIS — M79604 Pain in right leg: Secondary | ICD-10-CM | POA: Diagnosis not present

## 2020-06-23 DIAGNOSIS — M6281 Muscle weakness (generalized): Secondary | ICD-10-CM | POA: Diagnosis not present

## 2020-06-23 DIAGNOSIS — M25561 Pain in right knee: Secondary | ICD-10-CM | POA: Diagnosis not present

## 2020-06-24 DIAGNOSIS — N2581 Secondary hyperparathyroidism of renal origin: Secondary | ICD-10-CM | POA: Diagnosis not present

## 2020-06-24 DIAGNOSIS — Z992 Dependence on renal dialysis: Secondary | ICD-10-CM | POA: Diagnosis not present

## 2020-06-24 DIAGNOSIS — N186 End stage renal disease: Secondary | ICD-10-CM | POA: Diagnosis not present

## 2020-06-26 DIAGNOSIS — N2581 Secondary hyperparathyroidism of renal origin: Secondary | ICD-10-CM | POA: Diagnosis not present

## 2020-06-26 DIAGNOSIS — Z992 Dependence on renal dialysis: Secondary | ICD-10-CM | POA: Diagnosis not present

## 2020-06-26 DIAGNOSIS — N186 End stage renal disease: Secondary | ICD-10-CM | POA: Diagnosis not present

## 2020-06-29 DIAGNOSIS — N2581 Secondary hyperparathyroidism of renal origin: Secondary | ICD-10-CM | POA: Diagnosis not present

## 2020-06-29 DIAGNOSIS — Z992 Dependence on renal dialysis: Secondary | ICD-10-CM | POA: Diagnosis not present

## 2020-06-29 DIAGNOSIS — N186 End stage renal disease: Secondary | ICD-10-CM | POA: Diagnosis not present

## 2020-07-01 DIAGNOSIS — N186 End stage renal disease: Secondary | ICD-10-CM | POA: Diagnosis not present

## 2020-07-01 DIAGNOSIS — N2581 Secondary hyperparathyroidism of renal origin: Secondary | ICD-10-CM | POA: Diagnosis not present

## 2020-07-01 DIAGNOSIS — Z992 Dependence on renal dialysis: Secondary | ICD-10-CM | POA: Diagnosis not present

## 2020-07-03 DIAGNOSIS — N186 End stage renal disease: Secondary | ICD-10-CM | POA: Diagnosis not present

## 2020-07-03 DIAGNOSIS — N2581 Secondary hyperparathyroidism of renal origin: Secondary | ICD-10-CM | POA: Diagnosis not present

## 2020-07-03 DIAGNOSIS — Z992 Dependence on renal dialysis: Secondary | ICD-10-CM | POA: Diagnosis not present

## 2020-07-04 DIAGNOSIS — E1122 Type 2 diabetes mellitus with diabetic chronic kidney disease: Secondary | ICD-10-CM | POA: Diagnosis not present

## 2020-07-04 DIAGNOSIS — Z992 Dependence on renal dialysis: Secondary | ICD-10-CM | POA: Diagnosis not present

## 2020-07-04 DIAGNOSIS — N186 End stage renal disease: Secondary | ICD-10-CM | POA: Diagnosis not present

## 2020-07-06 DIAGNOSIS — Z992 Dependence on renal dialysis: Secondary | ICD-10-CM | POA: Diagnosis not present

## 2020-07-06 DIAGNOSIS — N186 End stage renal disease: Secondary | ICD-10-CM | POA: Diagnosis not present

## 2020-07-06 DIAGNOSIS — N2581 Secondary hyperparathyroidism of renal origin: Secondary | ICD-10-CM | POA: Diagnosis not present

## 2020-07-08 DIAGNOSIS — N186 End stage renal disease: Secondary | ICD-10-CM | POA: Diagnosis not present

## 2020-07-08 DIAGNOSIS — Z992 Dependence on renal dialysis: Secondary | ICD-10-CM | POA: Diagnosis not present

## 2020-07-08 DIAGNOSIS — N2581 Secondary hyperparathyroidism of renal origin: Secondary | ICD-10-CM | POA: Diagnosis not present

## 2020-07-10 DIAGNOSIS — N2581 Secondary hyperparathyroidism of renal origin: Secondary | ICD-10-CM | POA: Diagnosis not present

## 2020-07-10 DIAGNOSIS — N186 End stage renal disease: Secondary | ICD-10-CM | POA: Diagnosis not present

## 2020-07-10 DIAGNOSIS — Z992 Dependence on renal dialysis: Secondary | ICD-10-CM | POA: Diagnosis not present

## 2020-07-11 DIAGNOSIS — M79604 Pain in right leg: Secondary | ICD-10-CM | POA: Diagnosis not present

## 2020-07-11 DIAGNOSIS — M6281 Muscle weakness (generalized): Secondary | ICD-10-CM | POA: Diagnosis not present

## 2020-07-11 DIAGNOSIS — R262 Difficulty in walking, not elsewhere classified: Secondary | ICD-10-CM | POA: Diagnosis not present

## 2020-07-11 DIAGNOSIS — M25561 Pain in right knee: Secondary | ICD-10-CM | POA: Diagnosis not present

## 2020-07-12 DIAGNOSIS — N2581 Secondary hyperparathyroidism of renal origin: Secondary | ICD-10-CM | POA: Diagnosis not present

## 2020-07-12 DIAGNOSIS — Z992 Dependence on renal dialysis: Secondary | ICD-10-CM | POA: Diagnosis not present

## 2020-07-12 DIAGNOSIS — N186 End stage renal disease: Secondary | ICD-10-CM | POA: Diagnosis not present

## 2020-07-15 DIAGNOSIS — N2581 Secondary hyperparathyroidism of renal origin: Secondary | ICD-10-CM | POA: Diagnosis not present

## 2020-07-15 DIAGNOSIS — N186 End stage renal disease: Secondary | ICD-10-CM | POA: Diagnosis not present

## 2020-07-15 DIAGNOSIS — Z992 Dependence on renal dialysis: Secondary | ICD-10-CM | POA: Diagnosis not present

## 2020-07-17 DIAGNOSIS — N186 End stage renal disease: Secondary | ICD-10-CM | POA: Diagnosis not present

## 2020-07-17 DIAGNOSIS — N2581 Secondary hyperparathyroidism of renal origin: Secondary | ICD-10-CM | POA: Diagnosis not present

## 2020-07-17 DIAGNOSIS — Z992 Dependence on renal dialysis: Secondary | ICD-10-CM | POA: Diagnosis not present

## 2020-07-19 DIAGNOSIS — N186 End stage renal disease: Secondary | ICD-10-CM | POA: Diagnosis not present

## 2020-07-19 DIAGNOSIS — N2581 Secondary hyperparathyroidism of renal origin: Secondary | ICD-10-CM | POA: Diagnosis not present

## 2020-07-19 DIAGNOSIS — Z992 Dependence on renal dialysis: Secondary | ICD-10-CM | POA: Diagnosis not present

## 2020-07-22 DIAGNOSIS — N2581 Secondary hyperparathyroidism of renal origin: Secondary | ICD-10-CM | POA: Diagnosis not present

## 2020-07-22 DIAGNOSIS — Z992 Dependence on renal dialysis: Secondary | ICD-10-CM | POA: Diagnosis not present

## 2020-07-22 DIAGNOSIS — N186 End stage renal disease: Secondary | ICD-10-CM | POA: Diagnosis not present

## 2020-07-24 DIAGNOSIS — N186 End stage renal disease: Secondary | ICD-10-CM | POA: Diagnosis not present

## 2020-07-24 DIAGNOSIS — Z992 Dependence on renal dialysis: Secondary | ICD-10-CM | POA: Diagnosis not present

## 2020-07-24 DIAGNOSIS — N2581 Secondary hyperparathyroidism of renal origin: Secondary | ICD-10-CM | POA: Diagnosis not present

## 2020-07-26 DIAGNOSIS — N2581 Secondary hyperparathyroidism of renal origin: Secondary | ICD-10-CM | POA: Diagnosis not present

## 2020-07-26 DIAGNOSIS — N186 End stage renal disease: Secondary | ICD-10-CM | POA: Diagnosis not present

## 2020-07-26 DIAGNOSIS — Z992 Dependence on renal dialysis: Secondary | ICD-10-CM | POA: Diagnosis not present

## 2020-07-29 DIAGNOSIS — G8929 Other chronic pain: Secondary | ICD-10-CM | POA: Diagnosis not present

## 2020-07-29 DIAGNOSIS — N2581 Secondary hyperparathyroidism of renal origin: Secondary | ICD-10-CM | POA: Diagnosis not present

## 2020-07-29 DIAGNOSIS — E118 Type 2 diabetes mellitus with unspecified complications: Secondary | ICD-10-CM | POA: Diagnosis not present

## 2020-07-29 DIAGNOSIS — N185 Chronic kidney disease, stage 5: Secondary | ICD-10-CM | POA: Diagnosis not present

## 2020-07-29 DIAGNOSIS — M810 Age-related osteoporosis without current pathological fracture: Secondary | ICD-10-CM | POA: Diagnosis not present

## 2020-07-29 DIAGNOSIS — Z992 Dependence on renal dialysis: Secondary | ICD-10-CM | POA: Diagnosis not present

## 2020-07-29 DIAGNOSIS — E119 Type 2 diabetes mellitus without complications: Secondary | ICD-10-CM | POA: Diagnosis not present

## 2020-07-29 DIAGNOSIS — N186 End stage renal disease: Secondary | ICD-10-CM | POA: Diagnosis not present

## 2020-07-29 DIAGNOSIS — E1140 Type 2 diabetes mellitus with diabetic neuropathy, unspecified: Secondary | ICD-10-CM | POA: Diagnosis not present

## 2020-07-29 DIAGNOSIS — I10 Essential (primary) hypertension: Secondary | ICD-10-CM | POA: Diagnosis not present

## 2020-07-29 DIAGNOSIS — E785 Hyperlipidemia, unspecified: Secondary | ICD-10-CM | POA: Diagnosis not present

## 2020-07-31 DIAGNOSIS — Z992 Dependence on renal dialysis: Secondary | ICD-10-CM | POA: Diagnosis not present

## 2020-07-31 DIAGNOSIS — N186 End stage renal disease: Secondary | ICD-10-CM | POA: Diagnosis not present

## 2020-07-31 DIAGNOSIS — N2581 Secondary hyperparathyroidism of renal origin: Secondary | ICD-10-CM | POA: Diagnosis not present

## 2020-08-02 DIAGNOSIS — Z992 Dependence on renal dialysis: Secondary | ICD-10-CM | POA: Diagnosis not present

## 2020-08-02 DIAGNOSIS — N2581 Secondary hyperparathyroidism of renal origin: Secondary | ICD-10-CM | POA: Diagnosis not present

## 2020-08-02 DIAGNOSIS — N186 End stage renal disease: Secondary | ICD-10-CM | POA: Diagnosis not present

## 2020-08-04 DIAGNOSIS — Z992 Dependence on renal dialysis: Secondary | ICD-10-CM | POA: Diagnosis not present

## 2020-08-04 DIAGNOSIS — E1122 Type 2 diabetes mellitus with diabetic chronic kidney disease: Secondary | ICD-10-CM | POA: Diagnosis not present

## 2020-08-04 DIAGNOSIS — N186 End stage renal disease: Secondary | ICD-10-CM | POA: Diagnosis not present

## 2020-08-05 DIAGNOSIS — Z992 Dependence on renal dialysis: Secondary | ICD-10-CM | POA: Diagnosis not present

## 2020-08-05 DIAGNOSIS — N2581 Secondary hyperparathyroidism of renal origin: Secondary | ICD-10-CM | POA: Diagnosis not present

## 2020-08-05 DIAGNOSIS — N186 End stage renal disease: Secondary | ICD-10-CM | POA: Diagnosis not present

## 2020-08-07 DIAGNOSIS — N186 End stage renal disease: Secondary | ICD-10-CM | POA: Diagnosis not present

## 2020-08-07 DIAGNOSIS — Z992 Dependence on renal dialysis: Secondary | ICD-10-CM | POA: Diagnosis not present

## 2020-08-07 DIAGNOSIS — N2581 Secondary hyperparathyroidism of renal origin: Secondary | ICD-10-CM | POA: Diagnosis not present

## 2020-08-09 DIAGNOSIS — N2581 Secondary hyperparathyroidism of renal origin: Secondary | ICD-10-CM | POA: Diagnosis not present

## 2020-08-09 DIAGNOSIS — Z992 Dependence on renal dialysis: Secondary | ICD-10-CM | POA: Diagnosis not present

## 2020-08-09 DIAGNOSIS — N186 End stage renal disease: Secondary | ICD-10-CM | POA: Diagnosis not present

## 2020-08-12 DIAGNOSIS — N2581 Secondary hyperparathyroidism of renal origin: Secondary | ICD-10-CM | POA: Diagnosis not present

## 2020-08-12 DIAGNOSIS — N186 End stage renal disease: Secondary | ICD-10-CM | POA: Diagnosis not present

## 2020-08-12 DIAGNOSIS — Z992 Dependence on renal dialysis: Secondary | ICD-10-CM | POA: Diagnosis not present

## 2020-08-14 DIAGNOSIS — N186 End stage renal disease: Secondary | ICD-10-CM | POA: Diagnosis not present

## 2020-08-14 DIAGNOSIS — Z992 Dependence on renal dialysis: Secondary | ICD-10-CM | POA: Diagnosis not present

## 2020-08-14 DIAGNOSIS — N2581 Secondary hyperparathyroidism of renal origin: Secondary | ICD-10-CM | POA: Diagnosis not present

## 2020-08-15 DIAGNOSIS — M79604 Pain in right leg: Secondary | ICD-10-CM | POA: Diagnosis not present

## 2020-08-15 DIAGNOSIS — M25561 Pain in right knee: Secondary | ICD-10-CM | POA: Diagnosis not present

## 2020-08-15 DIAGNOSIS — R262 Difficulty in walking, not elsewhere classified: Secondary | ICD-10-CM | POA: Diagnosis not present

## 2020-08-15 DIAGNOSIS — M6281 Muscle weakness (generalized): Secondary | ICD-10-CM | POA: Diagnosis not present

## 2020-08-16 DIAGNOSIS — Z992 Dependence on renal dialysis: Secondary | ICD-10-CM | POA: Diagnosis not present

## 2020-08-16 DIAGNOSIS — N186 End stage renal disease: Secondary | ICD-10-CM | POA: Diagnosis not present

## 2020-08-16 DIAGNOSIS — N2581 Secondary hyperparathyroidism of renal origin: Secondary | ICD-10-CM | POA: Diagnosis not present

## 2020-08-18 DIAGNOSIS — M79604 Pain in right leg: Secondary | ICD-10-CM | POA: Diagnosis not present

## 2020-08-18 DIAGNOSIS — M6281 Muscle weakness (generalized): Secondary | ICD-10-CM | POA: Diagnosis not present

## 2020-08-18 DIAGNOSIS — M25561 Pain in right knee: Secondary | ICD-10-CM | POA: Diagnosis not present

## 2020-08-18 DIAGNOSIS — R262 Difficulty in walking, not elsewhere classified: Secondary | ICD-10-CM | POA: Diagnosis not present

## 2020-08-19 DIAGNOSIS — Z992 Dependence on renal dialysis: Secondary | ICD-10-CM | POA: Diagnosis not present

## 2020-08-19 DIAGNOSIS — N2581 Secondary hyperparathyroidism of renal origin: Secondary | ICD-10-CM | POA: Diagnosis not present

## 2020-08-19 DIAGNOSIS — N186 End stage renal disease: Secondary | ICD-10-CM | POA: Diagnosis not present

## 2020-08-21 DIAGNOSIS — N2581 Secondary hyperparathyroidism of renal origin: Secondary | ICD-10-CM | POA: Diagnosis not present

## 2020-08-21 DIAGNOSIS — N186 End stage renal disease: Secondary | ICD-10-CM | POA: Diagnosis not present

## 2020-08-21 DIAGNOSIS — Z992 Dependence on renal dialysis: Secondary | ICD-10-CM | POA: Diagnosis not present

## 2020-08-23 DIAGNOSIS — N186 End stage renal disease: Secondary | ICD-10-CM | POA: Diagnosis not present

## 2020-08-23 DIAGNOSIS — Z992 Dependence on renal dialysis: Secondary | ICD-10-CM | POA: Diagnosis not present

## 2020-08-23 DIAGNOSIS — N2581 Secondary hyperparathyroidism of renal origin: Secondary | ICD-10-CM | POA: Diagnosis not present

## 2020-08-24 NOTE — Progress Notes (Signed)
Cardiology Clinic Note   Patient Name: Joanne Carlson Date of Encounter: 08/25/2020  Primary Care Provider:  Vernie Shanks, MD Primary Cardiologist:  [name], MD  Patient Profile    Joanne Carlson 75 year old female presents the clinic today for follow-up evaluation of her essential hypertension and peripheral vascular disease.  Past Medical History    Past Medical History:  Diagnosis Date  . Arthritis    "legs, knees; not bad" (01/30/2018)  . Dizziness    occasionally  . ESRD (end stage renal disease) ([place])    [name]; TTS" (01/30/2018)  . History of colon polyps   . History of gout    takes Allopurinol daily (01/30/2018)  . Hyperlipidemia    takes Pravastatin daily  . Hypertension    takes Monopril daily  . Joint pain   . PAD (peripheral artery disease) ([place])   . Peripheral neuropathy   . Peripheral vascular disease ([place])   . Pneumonia    "once; years ago" (01/30/2018)  . Refusal of blood transfusions as patient is Jehovah's Witness    NO BLOOD OR BLOOD PRODUCTS. Albumin okay.   . Stroke ([place]) 1990s   "mini stroke; left lower mouth a little bit twisted since" (01/30/2018)  . Type II diabetes mellitus ([place])    diet controlled-   Past Surgical History:  Procedure Laterality Date  . ABDOMINAL AORTOGRAM W/LOWER EXTREMITY N/A 01/30/2018   Procedure: ABDOMINAL AORTOGRAM W/LOWER EXTREMITY;  Surgeon: Lorretta Harp, MD;  Location: [facility];  Service: Cardiovascular;  Laterality: N/A;  . ABDOMINAL AORTOGRAM W/LOWER EXTREMITY N/A 03/03/2020   Procedure: ABDOMINAL AORTOGRAM W/LOWER EXTREMITY;  Surgeon: Lorretta Harp, MD;  Location: [facility];  Service: Cardiovascular;  Laterality: N/A;  . APPENDECTOMY    . BASCILIC VEIN TRANSPOSITION Right 10/03/2013   Procedure: BRACHIOCEPHALIC Arteriovenous Fistula ;  Surgeon: Mal Misty, MD;  Location: [facility];  Service: Vascular;  Laterality: Right;  . BASCILIC VEIN TRANSPOSITION Right 11/17/2016    Procedure: BASCILIC VEIN TRANSPOSITION- RIGHT SINGLE STAGE;  Surgeon: Conrad Alvord, MD;  Location: [facility];  Service: Vascular;  Laterality: Right;  . COLONOSCOPY    . CYST EXCISION  1980's   cyst removed from lower abdomen  . DILATION AND CURETTAGE OF UTERUS    . ESOPHAGOGASTRODUODENOSCOPY    . PERIPHERAL VASCULAR BALLOON ANGIOPLASTY Right 01/30/2018   Procedure: PERIPHERAL VASCULAR BALLOON ANGIOPLASTY;  Surgeon: Lorretta Harp, MD;  Location: [facility];  Service: Cardiovascular;  Laterality: Right;  superficial femoral  . PERIPHERAL VASCULAR INTERVENTION  03/03/2020   Procedure: PERIPHERAL VASCULAR INTERVENTION;  Surgeon: Lorretta Harp, MD;  Location: [facility];  Service: Cardiovascular;;  external iliac stent left sfa lithotripsy/ drug coated balloon  . SHOULDER ARTHROSCOPY W/ ROTATOR CUFF REPAIR Right 2013  . TUBAL LIGATION    . UPPER EXTREMITY VENOGRAPHY Bilateral 11/08/2016   Procedure: Bilateral Upper Extremity Venography;  Surgeon: Conrad Viola, MD;  Location: [facility];  Service: Cardiovascular;  Laterality: Bilateral;    Allergies  Allergies  Allergen Reactions  . 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  She was admitted 8/21 for PV procedure.  She had a small nonhealing ulcer on her left great toe.  Her lower  extremity Dopplers 7/21 showed ABIs 0.8 bilaterally with occluded distal right FSA, and high-grade left SFA stenosis with three-vessel runoff.  She was seen 01/04/2020 and it was noted that her right great toe had healed.  During that time she noted left calf claudication daily which exercised on the treadmill.  During that time she wished to wait on peripheral vascular angiography and wanted her right hand to heal.   She underwent angiography 03/03/2020 which showed widely patent abdominal aorta, right SFA widely patent, diffusely diseased proximal and mid segmental opacification left SFA stenosis in the 80-90% range and claudication.  Right showed scattered mid and distal 50% SFA stenosis with two-vessel runoff with posterior tibial occlusion.  She was treated with successful PTA and stenting of the iatrogenic dissection involving the left external iliac artery, shockwave angioplasty followed by [facility] of her diffusely diseased segmental proximal mid left SFA and an excellent distal pulse was noted at the end of procedure.  She remained stable with stable cath sites.  Her Plavix and aspirin will continue and follow-up was planned with Dr. Gwenlyn Found.  Her follow-up Dopplers 03/12/2020 showed normal ABIs bilaterally 03/12/2020 and lower extremity arterial duplex showed improved L SFA velocity status post intervention.  Dr. Gwenlyn Found plan to repeat lower extremity ABIs and lower extremity arterials in 6 months.  She presents the clinic today for follow-up evaluation states she felt much better after her intervention.  She does note however that in the last 2 months she has been noticing some right hip/groin pain.  She does not remember any falls or report any injuries.  She continues to be physically active walking when she is shopping and to catch the bus.  She notes that the pain subsides with rest.  At this time she does feel like it is somewhat better than it was back in December.  We discussed repeating her lower extremity ultrasounds.  I will give her the salty 6 diet sheet, have her continue to increase her physical activity, order ABIs and lower extremity arterials, and have her follow-up with Dr. Gwenlyn Found after her test.  Today she denies chest pain, shortness of breath, lower extremity edema, fatigue, palpitations, melena, hematuria, hemoptysis, diaphoresis, weakness, presyncope, syncope, orthopnea, and PND.   Home Medications     Prior to Admission medications   Medication Sig Start Date End Date Taking? Authorizing Provider  allopurinol (ZYLOPRIM) 100 MG tablet Take 200 mg by mouth daily.  05/25/13   [provider]  aspirin EC 81 MG tablet Take 81 mg by mouth daily.    [provider]  atorvastatin (LIPITOR) 80 MG tablet TAKE 1 TABLET BY MOUTH ONCE DAILY AT  6  PM Patient taking differently: Take 80 mg by mouth daily at 6 PM.  02/08/20   Lendon Colonel, NP  b complex-vitamin c-folic acid (NEPHRO-VITE) 0.8 MG TABS tablet Take 1 tablet by mouth daily.    [provider]  Blood Glucose Monitoring Suppl (ACCU-CHEK AVIVA PLUS) w/Device KIT  10/29/17   [provider]  calcium acetate (PHOSLO) 667 MG capsule Take 667 mg by mouth 3 (three) times daily with meals.  06/22/13   [provider]  cinacalcet (SENSIPAR) 60 MG tablet Take 60 mg by mouth daily.     [provider]  clopidogrel (PLAVIX) 75 MG tablet Take 1 tablet (75 mg total) by mouth daily. 05/19/20   Lorretta Harp, MD  gabapentin (NEURONTIN) 100 MG capsule Take 1 capsule by mouth every morning and 1  capsule at bedtime. PATIENT NEEDS OFFICE VISIT FOR ADDITIONAL REFILLS Patient taking differently: Take 200 mg by mouth at bedtime.  05/25/13   Collene Leyden, PA-C  glucose blood test strip Use to test blood sugar daily. Dx code: 250.02. 11/06/12   Theda Sers, PA-C  Lancets MISC Use to test blood sugar daily. Dx code 49.02. 11/08/12   Collene Leyden, PA-C  lidocaine-prilocaine (EMLA) cream Apply 1 application topically Every Tuesday,Thursday,and Saturday with dialysis.  01/12/18   [provider]  midodrine (PROAMATINE) 10 MG tablet Take 10 mg by mouth Every Tuesday,Thursday,and Saturday with dialysis.  04/05/19   [provider]  predniSONE (DELTASONE) 10 MG tablet Take 10 mg by mouth daily with breakfast.    [provider]    Family History    Family History  Problem  Relation Age of Onset  . Depression Mother   . Hypertension Mother   . Depression Father   . Depression Sister   . Hypertension Sister    She indicated that her mother is deceased. She indicated that her father is deceased. She indicated that the status of her sister is unknown.  Social History    Social History   Socioeconomic History  . Marital status: Widowed    Spouse name: Not on file  . Number of children: Not on file  . Years of education: Not on file  . Highest education level: Not on file  Occupational History  . Not on file  Tobacco Use  . Smoking status: Former Smoker    Years: 10.00    Types: Cigarettes    Quit date: 1970    Years since quitting: 52.1  . Smokeless tobacco: Never Used  . Tobacco comment: "never a heavy smoker; smoked off and on"  Vaping Use  . Vaping Use: Never used  Substance and Sexual Activity  . Alcohol use: Not Currently    Comment: "in my younger days"  . Drug use: Not Currently    Types: Marijuana    Comment: "weed in my teens"  . Sexual activity: Not Currently    Birth control/protection: Post-menopausal  Other Topics Concern  . Not on file  Social History Narrative  . Not on file   Social Determinants of Health   Financial Resource Strain: Not on file  Food Insecurity: Not on file  Transportation Needs: Not on file  Physical Activity: Not on file  Stress: Not on file  Social Connections: Not on file  Intimate Partner Violence: Not on file     Review of Systems    General:  No chills, fever, night sweats or weight changes.  Cardiovascular:  No chest pain, dyspnea on exertion, edema, orthopnea, palpitations, paroxysmal nocturnal dyspnea. Dermatological: No rash, lesions/masses Respiratory: No cough, dyspnea Urologic: No hematuria, dysuria Abdominal:   No nausea, vomiting, diarrhea, bright red blood per rectum, melena, or hematemesis Neurologic:  No visual changes, wkns, changes in mental status. All other systems  reviewed and are otherwise negative except as noted above.  Physical Exam    VS:  BP 130/60   Pulse 60   Ht 5' 4"  (1.626 m)   Wt 173 lb (78.5 kg)   BMI 29.70 kg/m  , BMI Body mass index is 29.7 kg/m. GEN: Well nourished, well developed, in no acute distress. HEENT: normal. Neck: Supple, no JVD, carotid bruits, or masses. Cardiac: RRR, no murmurs, rubs, or gallops. No clubbing, cyanosis, edema.  Radials/DP/PT 2+ and equal bilaterally.  Respiratory:  Respirations  regular and unlabored, clear to auscultation bilaterally. GI: Soft, nontender, nondistended, BS + x 4. MS: no deformity or atrophy. Skin: warm and dry, no rash. Neuro:  Strength and sensation are intact. Psych: Normal affect.  Accessory Clinical Findings    Recent Labs: 03/04/2020: BUN 42; Creatinine, Ser 8.99; Hemoglobin 10.2; Platelets 206; Potassium 5.2; Sodium 137   Recent Lipid Panel    Component Value Date/Time   CHOL 275 (H) 07/22/2008 2241   TRIG 211 (H) 07/22/2008 2241   HDL 41 07/22/2008 2241   CHOLHDL 6.7 Ratio 07/22/2008 2241   VLDL 42 (H) 07/22/2008 2241   LDLCALC 192 (H) 07/22/2008 2241    ECG personally reviewed by me today-none today.  Lower extremity arterials 03/12/2020  Improved L SFA velocities status post intervention Summary:  Left: Extensive heterogenous plaque throughout.  30-49% stenosis at the ostial SFA, high end range.  30-49% stenosis in the mid SFA, s/p angioplasty.     See table(s) above for measurements and observations.  See Aortoiliac duplex and ABI reports.   Suggest follow up study in 6 months.  ABIs 03/12/2020  Summary:  Right: Resting right ankle-brachial index is within normal range. No  evidence of significant right lower extremity arterial disease. The right  toe-brachial index is normal.   TBIs increased by .47.  Left: Resting left ankle-brachial index indicates noncompressible left  lower extremity arteries. The left toe-brachial index is normal.   TBIs  increased by .71.    *See table(s) above for measurements and observations.  See Aortoiliac and LE Arterial duplex reports.   Suggest follow up study in 6 months.  Assessment & Plan   1.  Peripheral arterial disease-denies lower extremity claudication.  Does report some right groin/hip pain.  She does feel like this is improving somewhat.  Underwent angiography 03/03/2020 which showed widely patent abdominal aorta, right SFA widely patent, diffusely diseased proximal and mid segmental opacification left SFA stenosis in the 80-90% range and claudication.  Right showed scattered mid and distal 50% SFA stenosis with two-vessel runoff with posterior tibial occlusion.  She was treated with successful PTA and stenting of the iatrogenic dissection involving the left external iliac artery, shockwave angioplasty followed by [facility] of her diffusely diseased segmental proximal mid left SFA and an excellent distal pulse was noted at the end of procedure.  Follow-up study showed improved L SFA velocities post intervention and normal bilateral ABIs. Continue aspirin, atorvastatin, clopidogrel Heart healthy low-sodium diet-salty 6 given Increase physical activity as tolerated Repeat lower extremity arterials and ABIs 3/22  Essential hypertension-BP today 130/60.  Well-controlled at home. Heart healthy low-sodium diet-salty 6 given Increase physical activity as tolerated  Hyperlipidemia on statin therapy.  Reports compliance Continue atorvastatin Low-sodium heart healthy high-fiber diet Follows with PCP  Type 2 diabetes-A1c 6.5 on 03/03/2020 Follows with PCP  Disposition: Follow-up with Dr. Gwenlyn Found after lower extremity Dopplers.  Jossie Ng. Rodriguez Aguinaldo NP-C    08/25/2020, 12:04 PM [address] Office [phone] Fax [phone]  Notice: This dictation was prepared with Dragon dictation along with smaller phrase technology. Any transcriptional errors that  result from this process are unintentional and may not be corrected upon review.  I spent 12 minutes examining this patient, reviewing medications, and using patient centered shared decision making involving her cardiac care.  Prior to her visit I spent greater than 20 minutes reviewing her past medical history,  medications, and prior cardiac tests.

## 2020-08-25 ENCOUNTER — Ambulatory Visit (INDEPENDENT_AMBULATORY_CARE_PROVIDER_SITE_OTHER): Payer: Medicare HMO | Admitting: General Practice

## 2020-08-25 ENCOUNTER — Encounter: Payer: Self-pay | Admitting: General Practice

## 2020-08-25 ENCOUNTER — Other Ambulatory Visit: Payer: Self-pay

## 2020-08-25 VITALS — BP 130/60 | HR 60 | Ht 64.0 in | Wt 173.0 lb

## 2020-08-25 DIAGNOSIS — E7800 Pure hypercholesterolemia, unspecified: Secondary | ICD-10-CM

## 2020-08-25 DIAGNOSIS — I739 Peripheral vascular disease, unspecified: Secondary | ICD-10-CM | POA: Diagnosis not present

## 2020-08-25 DIAGNOSIS — Z794 Long term (current) use of insulin: Secondary | ICD-10-CM | POA: Diagnosis not present

## 2020-08-25 DIAGNOSIS — I10 Essential (primary) hypertension: Secondary | ICD-10-CM | POA: Diagnosis not present

## 2020-08-25 DIAGNOSIS — E119 Type 2 diabetes mellitus without complications: Secondary | ICD-10-CM

## 2020-08-25 NOTE — Patient Instructions (Signed)
Medication Instructions:  The current medical regimen is effective;  continue present plan and medications as directed. Please refer to the Current Medication list given to you today.  *If you need a refill on your cardiac medications before your next appointment, please call your pharmacy*  Lab Work:   Testing/Procedures:  NONE    NONE  Special Instructions Your physician has requested that you have an ankle brachial index (ABI's/LEA's). During this test an ultrasound and blood pressure cuff are used to evaluate the arteries that supply the arms and legs with blood. Allow thirty minutes for this exam. There are no restrictions or special instructions.  PLEASE READ AND FOLLOW SALTY 6-ATTACHED-1,800mg  daily  PLEASE INCREASE PHYSICAL ACTIVITY AS TOLERATED  Follow-Up: Your next appointment:  AFTER TESTING In Person with [name], MD   At [facility], you and your health needs are our priority.  As part of our continuing mission to provide you with exceptional heart care, we have created designated Provider Care Teams.  These Care Teams include your primary Cardiologist (physician) and Advanced Practice Providers (APPs -  Physician Assistants and Nurse Practitioners) who all work together to provide you with the care you need, when you need it.  We recommend signing up for the patient portal called "MyChart".  Sign up information is provided on this After Visit Summary.  MyChart is used to connect with patients for Virtual Visits (Telemedicine).  Patients are able to view lab/test results, encounter notes, upcoming appointments, etc.  Non-urgent messages can be sent to your provider as well.   To learn more about what you can do with MyChart, go to [email or web address].              6 SALTY THINGS TO AVOID     1,800MG  DAILY

## 2020-08-26 DIAGNOSIS — N186 End stage renal disease: Secondary | ICD-10-CM | POA: Diagnosis not present

## 2020-08-26 DIAGNOSIS — N2581 Secondary hyperparathyroidism of renal origin: Secondary | ICD-10-CM | POA: Diagnosis not present

## 2020-08-26 DIAGNOSIS — Z992 Dependence on renal dialysis: Secondary | ICD-10-CM | POA: Diagnosis not present

## 2020-08-28 DIAGNOSIS — N2581 Secondary hyperparathyroidism of renal origin: Secondary | ICD-10-CM | POA: Diagnosis not present

## 2020-08-28 DIAGNOSIS — Z992 Dependence on renal dialysis: Secondary | ICD-10-CM | POA: Diagnosis not present

## 2020-08-28 DIAGNOSIS — N186 End stage renal disease: Secondary | ICD-10-CM | POA: Diagnosis not present

## 2020-08-30 DIAGNOSIS — Z992 Dependence on renal dialysis: Secondary | ICD-10-CM | POA: Diagnosis not present

## 2020-08-30 DIAGNOSIS — N186 End stage renal disease: Secondary | ICD-10-CM | POA: Diagnosis not present

## 2020-08-30 DIAGNOSIS — N2581 Secondary hyperparathyroidism of renal origin: Secondary | ICD-10-CM | POA: Diagnosis not present

## 2020-09-01 DIAGNOSIS — Z992 Dependence on renal dialysis: Secondary | ICD-10-CM | POA: Diagnosis not present

## 2020-09-01 DIAGNOSIS — E1122 Type 2 diabetes mellitus with diabetic chronic kidney disease: Secondary | ICD-10-CM | POA: Diagnosis not present

## 2020-09-01 DIAGNOSIS — N186 End stage renal disease: Secondary | ICD-10-CM | POA: Diagnosis not present

## 2020-09-02 DIAGNOSIS — N2581 Secondary hyperparathyroidism of renal origin: Secondary | ICD-10-CM | POA: Diagnosis not present

## 2020-09-02 DIAGNOSIS — N186 End stage renal disease: Secondary | ICD-10-CM | POA: Diagnosis not present

## 2020-09-02 DIAGNOSIS — Z992 Dependence on renal dialysis: Secondary | ICD-10-CM | POA: Diagnosis not present

## 2020-09-03 DIAGNOSIS — M79604 Pain in right leg: Secondary | ICD-10-CM | POA: Diagnosis not present

## 2020-09-03 DIAGNOSIS — M25561 Pain in right knee: Secondary | ICD-10-CM | POA: Diagnosis not present

## 2020-09-03 DIAGNOSIS — R262 Difficulty in walking, not elsewhere classified: Secondary | ICD-10-CM | POA: Diagnosis not present

## 2020-09-03 DIAGNOSIS — M6281 Muscle weakness (generalized): Secondary | ICD-10-CM | POA: Diagnosis not present

## 2020-09-04 DIAGNOSIS — N2581 Secondary hyperparathyroidism of renal origin: Secondary | ICD-10-CM | POA: Diagnosis not present

## 2020-09-04 DIAGNOSIS — N186 End stage renal disease: Secondary | ICD-10-CM | POA: Diagnosis not present

## 2020-09-04 DIAGNOSIS — Z992 Dependence on renal dialysis: Secondary | ICD-10-CM | POA: Diagnosis not present

## 2020-09-05 ENCOUNTER — Ambulatory Visit (HOSPITAL_COMMUNITY)
Admission: RE | Admit: 2020-09-05 | Discharge: 2020-09-05 | Disposition: A | Discharge status: Home / Self Care | Payer: Medicare HMO | Source: Ambulatory Visit | Attending: Cardiology | Admitting: Cardiology

## 2020-09-05 ENCOUNTER — Other Ambulatory Visit (HOSPITAL_COMMUNITY): Payer: Self-pay | Admitting: Cardiovascular Disease

## 2020-09-05 ENCOUNTER — Other Ambulatory Visit: Payer: Self-pay

## 2020-09-05 DIAGNOSIS — I739 Peripheral vascular disease, unspecified: Secondary | ICD-10-CM

## 2020-09-05 DIAGNOSIS — Z95828 Presence of other vascular implants and grafts: Secondary | ICD-10-CM

## 2020-09-05 DIAGNOSIS — Z9862 Peripheral vascular angioplasty status: Secondary | ICD-10-CM

## 2020-09-05 DIAGNOSIS — I70229 Atherosclerosis of native arteries of extremities with rest pain, unspecified extremity: Secondary | ICD-10-CM

## 2020-09-06 ENCOUNTER — Encounter (HOSPITAL_COMMUNITY): Payer: Medicare HMO

## 2020-09-06 DIAGNOSIS — N2581 Secondary hyperparathyroidism of renal origin: Secondary | ICD-10-CM | POA: Diagnosis not present

## 2020-09-06 DIAGNOSIS — Z992 Dependence on renal dialysis: Secondary | ICD-10-CM | POA: Diagnosis not present

## 2020-09-06 DIAGNOSIS — N186 End stage renal disease: Secondary | ICD-10-CM | POA: Diagnosis not present

## 2020-09-09 DIAGNOSIS — H52209 Unspecified astigmatism, unspecified eye: Secondary | ICD-10-CM | POA: Diagnosis not present

## 2020-09-09 DIAGNOSIS — Z992 Dependence on renal dialysis: Secondary | ICD-10-CM | POA: Diagnosis not present

## 2020-09-09 DIAGNOSIS — N186 End stage renal disease: Secondary | ICD-10-CM | POA: Diagnosis not present

## 2020-09-09 DIAGNOSIS — H524 Presbyopia: Secondary | ICD-10-CM | POA: Diagnosis not present

## 2020-09-09 DIAGNOSIS — N2581 Secondary hyperparathyroidism of renal origin: Secondary | ICD-10-CM | POA: Diagnosis not present

## 2020-09-09 DIAGNOSIS — H5203 Hypermetropia, bilateral: Secondary | ICD-10-CM | POA: Diagnosis not present

## 2020-09-11 DIAGNOSIS — N2581 Secondary hyperparathyroidism of renal origin: Secondary | ICD-10-CM | POA: Diagnosis not present

## 2020-09-11 DIAGNOSIS — Z992 Dependence on renal dialysis: Secondary | ICD-10-CM | POA: Diagnosis not present

## 2020-09-11 DIAGNOSIS — N186 End stage renal disease: Secondary | ICD-10-CM | POA: Diagnosis not present

## 2020-09-12 ENCOUNTER — Other Ambulatory Visit: Payer: Self-pay | Admitting: Family Medicine

## 2020-09-12 DIAGNOSIS — Z0000 Encounter for general adult medical examination without abnormal findings: Secondary | ICD-10-CM

## 2020-09-13 DIAGNOSIS — Z992 Dependence on renal dialysis: Secondary | ICD-10-CM | POA: Diagnosis not present

## 2020-09-13 DIAGNOSIS — N186 End stage renal disease: Secondary | ICD-10-CM | POA: Diagnosis not present

## 2020-09-13 DIAGNOSIS — N2581 Secondary hyperparathyroidism of renal origin: Secondary | ICD-10-CM | POA: Diagnosis not present

## 2020-09-15 DIAGNOSIS — M25531 Pain in right wrist: Secondary | ICD-10-CM | POA: Diagnosis not present

## 2020-09-15 DIAGNOSIS — M79642 Pain in left hand: Secondary | ICD-10-CM | POA: Diagnosis not present

## 2020-09-16 DIAGNOSIS — N186 End stage renal disease: Secondary | ICD-10-CM | POA: Diagnosis not present

## 2020-09-16 DIAGNOSIS — N2581 Secondary hyperparathyroidism of renal origin: Secondary | ICD-10-CM | POA: Diagnosis not present

## 2020-09-16 DIAGNOSIS — Z992 Dependence on renal dialysis: Secondary | ICD-10-CM | POA: Diagnosis not present

## 2020-09-18 DIAGNOSIS — N2581 Secondary hyperparathyroidism of renal origin: Secondary | ICD-10-CM | POA: Diagnosis not present

## 2020-09-18 DIAGNOSIS — N186 End stage renal disease: Secondary | ICD-10-CM | POA: Diagnosis not present

## 2020-09-18 DIAGNOSIS — Z992 Dependence on renal dialysis: Secondary | ICD-10-CM | POA: Diagnosis not present

## 2020-09-20 DIAGNOSIS — N186 End stage renal disease: Secondary | ICD-10-CM | POA: Diagnosis not present

## 2020-09-20 DIAGNOSIS — N2581 Secondary hyperparathyroidism of renal origin: Secondary | ICD-10-CM | POA: Diagnosis not present

## 2020-09-20 DIAGNOSIS — Z992 Dependence on renal dialysis: Secondary | ICD-10-CM | POA: Diagnosis not present

## 2020-09-22 DIAGNOSIS — M545 Low back pain, unspecified: Secondary | ICD-10-CM | POA: Diagnosis not present

## 2020-09-22 DIAGNOSIS — Z992 Dependence on renal dialysis: Secondary | ICD-10-CM | POA: Diagnosis not present

## 2020-09-22 DIAGNOSIS — N2581 Secondary hyperparathyroidism of renal origin: Secondary | ICD-10-CM | POA: Diagnosis not present

## 2020-09-22 DIAGNOSIS — E785 Hyperlipidemia, unspecified: Secondary | ICD-10-CM | POA: Diagnosis not present

## 2020-09-22 DIAGNOSIS — E118 Type 2 diabetes mellitus with unspecified complications: Secondary | ICD-10-CM | POA: Diagnosis not present

## 2020-09-22 DIAGNOSIS — I10 Essential (primary) hypertension: Secondary | ICD-10-CM | POA: Diagnosis not present

## 2020-09-22 DIAGNOSIS — E1140 Type 2 diabetes mellitus with diabetic neuropathy, unspecified: Secondary | ICD-10-CM | POA: Diagnosis not present

## 2020-09-22 DIAGNOSIS — N185 Chronic kidney disease, stage 5: Secondary | ICD-10-CM | POA: Diagnosis not present

## 2020-09-22 DIAGNOSIS — M810 Age-related osteoporosis without current pathological fracture: Secondary | ICD-10-CM | POA: Diagnosis not present

## 2020-09-22 DIAGNOSIS — M5459 Other low back pain: Secondary | ICD-10-CM | POA: Diagnosis not present

## 2020-09-23 DIAGNOSIS — N186 End stage renal disease: Secondary | ICD-10-CM | POA: Diagnosis not present

## 2020-09-23 DIAGNOSIS — N2581 Secondary hyperparathyroidism of renal origin: Secondary | ICD-10-CM | POA: Diagnosis not present

## 2020-09-23 DIAGNOSIS — Z992 Dependence on renal dialysis: Secondary | ICD-10-CM | POA: Diagnosis not present

## 2020-09-25 DIAGNOSIS — Z992 Dependence on renal dialysis: Secondary | ICD-10-CM | POA: Diagnosis not present

## 2020-09-25 DIAGNOSIS — N2581 Secondary hyperparathyroidism of renal origin: Secondary | ICD-10-CM | POA: Diagnosis not present

## 2020-09-25 DIAGNOSIS — N186 End stage renal disease: Secondary | ICD-10-CM | POA: Diagnosis not present

## 2020-09-27 DIAGNOSIS — N186 End stage renal disease: Secondary | ICD-10-CM | POA: Diagnosis not present

## 2020-09-27 DIAGNOSIS — N2581 Secondary hyperparathyroidism of renal origin: Secondary | ICD-10-CM | POA: Diagnosis not present

## 2020-09-27 DIAGNOSIS — Z992 Dependence on renal dialysis: Secondary | ICD-10-CM | POA: Diagnosis not present

## 2020-09-30 DIAGNOSIS — N186 End stage renal disease: Secondary | ICD-10-CM | POA: Diagnosis not present

## 2020-09-30 DIAGNOSIS — Z992 Dependence on renal dialysis: Secondary | ICD-10-CM | POA: Diagnosis not present

## 2020-09-30 DIAGNOSIS — N2581 Secondary hyperparathyroidism of renal origin: Secondary | ICD-10-CM | POA: Diagnosis not present

## 2020-10-02 DIAGNOSIS — E1122 Type 2 diabetes mellitus with diabetic chronic kidney disease: Secondary | ICD-10-CM | POA: Diagnosis not present

## 2020-10-02 DIAGNOSIS — Z992 Dependence on renal dialysis: Secondary | ICD-10-CM | POA: Diagnosis not present

## 2020-10-02 DIAGNOSIS — N2581 Secondary hyperparathyroidism of renal origin: Secondary | ICD-10-CM | POA: Diagnosis not present

## 2020-10-02 DIAGNOSIS — N186 End stage renal disease: Secondary | ICD-10-CM | POA: Diagnosis not present

## 2020-10-04 DIAGNOSIS — N2581 Secondary hyperparathyroidism of renal origin: Secondary | ICD-10-CM | POA: Diagnosis not present

## 2020-10-04 DIAGNOSIS — N186 End stage renal disease: Secondary | ICD-10-CM | POA: Diagnosis not present

## 2020-10-04 DIAGNOSIS — Z992 Dependence on renal dialysis: Secondary | ICD-10-CM | POA: Diagnosis not present

## 2020-10-07 DIAGNOSIS — N186 End stage renal disease: Secondary | ICD-10-CM | POA: Diagnosis not present

## 2020-10-07 DIAGNOSIS — N2581 Secondary hyperparathyroidism of renal origin: Secondary | ICD-10-CM | POA: Diagnosis not present

## 2020-10-07 DIAGNOSIS — Z992 Dependence on renal dialysis: Secondary | ICD-10-CM | POA: Diagnosis not present

## 2020-10-09 DIAGNOSIS — Z992 Dependence on renal dialysis: Secondary | ICD-10-CM | POA: Diagnosis not present

## 2020-10-09 DIAGNOSIS — N2581 Secondary hyperparathyroidism of renal origin: Secondary | ICD-10-CM | POA: Diagnosis not present

## 2020-10-09 DIAGNOSIS — N186 End stage renal disease: Secondary | ICD-10-CM | POA: Diagnosis not present

## 2020-10-10 ENCOUNTER — Other Ambulatory Visit: Payer: Self-pay

## 2020-10-10 ENCOUNTER — Encounter: Payer: Self-pay | Admitting: Cardiovascular Disease

## 2020-10-10 ENCOUNTER — Ambulatory Visit (INDEPENDENT_AMBULATORY_CARE_PROVIDER_SITE_OTHER): Payer: Medicare HMO | Admitting: Cardiovascular Disease

## 2020-10-10 VITALS — BP 122/56 | HR 76 | Ht 64.0 in | Wt 169.2 lb

## 2020-10-10 DIAGNOSIS — R0989 Other specified symptoms and signs involving the circulatory and respiratory systems: Secondary | ICD-10-CM

## 2020-10-10 DIAGNOSIS — I10 Essential (primary) hypertension: Secondary | ICD-10-CM

## 2020-10-10 DIAGNOSIS — I70229 Atherosclerosis of native arteries of extremities with rest pain, unspecified extremity: Secondary | ICD-10-CM | POA: Diagnosis not present

## 2020-10-10 DIAGNOSIS — E7800 Pure hypercholesterolemia, unspecified: Secondary | ICD-10-CM | POA: Diagnosis not present

## 2020-10-10 NOTE — Patient Instructions (Signed)
Medication Instructions:  Your physician recommends that you continue on your current medications as directed. Please refer to the Current Medication list given to you today.  *If you need a refill on your cardiac medications before your next appointment, please call your pharmacy*  Testing/Procedures: Your physician has requested that you have a carotid duplex. This test is an ultrasound of the carotid arteries in your neck. It looks at blood flow through these arteries that supply the brain with blood. Allow one hour for this exam. There are no restrictions or special instructions. This procedure is done at [facility] [address]nd Floor.  Your physician has requested that you have an echocardiogram. Echocardiography is a painless test that uses sound waves to create images of your heart. It provides your doctor with information about the size and shape of your heart and how well your heart's chambers and valves are working. This procedure takes approximately one hour. There are no restrictions for this procedure. This procedure is done at [address]rd [address].  Your physician has requested that you have a lower extremity arterial duplex. This test is an ultrasound of the arteries in the legs. It looks at arterial blood flow in the legs. Allow one hour for Lower Arterial scans. There are no restrictions or special instructions  This procedure is done at [facility] 2nd Floor.  To be done March of 2023    Follow-Up: At [facility], you and your health needs are our priority.  As part of our continuing mission to provide you with exceptional heart care, we have created designated Provider Care Teams.  These Care Teams include your primary Cardiologist (physician) and Advanced Practice Providers (APPs -  Physician Assistants and Nurse Practitioners) who all work together to provide you with the care you need, when you need it.  We recommend signing up for the patient portal called  "MyChart".  Sign up information is provided on this After Visit Summary.  MyChart is used to connect with patients for Virtual Visits (Telemedicine).  Patients are able to view lab/test results, encounter notes, upcoming appointments, etc.  Non-urgent messages can be sent to your provider as well.   To learn more about what you can do with MyChart, go to [email or web address].    Your next appointment:   12 month(s)  The format for your next appointment:   In Person  Provider:   [name], MD

## 2020-10-10 NOTE — Assessment & Plan Note (Signed)
History of critical limb ischemia with a nonhealing wound on her right great toe.  I angiogrammed her 01/30/2018 revealing a 99% calcified mid right SFA stenosis.  I performed a chocolate balloon atherectomy followed by drug-coated balloon angioplasty with excellent result.  Her Dopplers improved and her wound ultimately healed.  She was noticing some left lower extremity claudication.  Angiography performed 03/03/2020 revealed a patent left external iliac artery stent, high-grade diffuse segmental calcified left SFA stenosis which I intervened on using shockwave intravascular lithotripsy, and drug-coated balloon angioplasty.  She has three-vessel runoff.  Her claudication has not improved and her Dopplers normalized and he remained widely patent.

## 2020-10-10 NOTE — Addendum Note (Signed)
Addended by: Beatrix Fetters on: 10/10/2020 01:40 PM   Modules accepted: Orders

## 2020-10-10 NOTE — Assessment & Plan Note (Signed)
History of hyperlipidemia on atorvastatin with lipid profile performed 09/02/2020 revealing a total cholesterol 112, LDL of 64 and HDL of 29.

## 2020-10-10 NOTE — Assessment & Plan Note (Signed)
History of essential hypertension a blood pressure measured today at 122/56.  She is not on antihypertensive medications.

## 2020-10-10 NOTE — Progress Notes (Signed)
10/10/2020 Joanne Carlson   Apr 08, 1946  [id number]  Primary Physician Vernie Shanks, MD Primary Cardiologist: Lorretta Harp MD [name], [place]  HPI:  Joanne Carlson is a 75 y.o.  mother of 2 children referred by Dr. Amalia Hailey for peripheral vascular evaluation because of critical limb ischemia. I last saw her in the office 01/04/2020. She has a history of treated hypertension, diabetes and hyperlipidemia. 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 Dr. Adele Barthel placed an AV fistula. She's never had a heart attack or stroke. She denies chest pain or shortness of breath. She's had a small non-healing ulcer on her left great toe treated with antibiotics recently and according to her this is slowly healing. Lower extremity arterial Doppler studies performed in our office 01/03/17 revealed ABIs approximately 0.8 bilaterally with an occluded distal right SFA and high-grade left SFA stenosis with three-vessel runoff.  She had developed a nonhealing wound on her right great toe. She is currently on hemodialysis. I angiogram to her 01/30/2018 revealing a 99% calcified mid right SFA stenosis. I performed chocolate balloon atherectomy followed by drug-eluting balloon angioplasty with excellent result. Dopplers improved. Her woundultimately healed.  Since I saw her in the office 01/04/2020 I did perform peripheral angiography on her 03/03/2020 revealing minimal disease in the mid and distal right SFA in the 50% range, patent left external iliac artery stent with high-grade segmental calcified proximal mid left SFA stenosis.  She underwent shockwave intravascular lithotripsy and drug-coated balloon angioplasty with excellent result.  Follow-up Dopplers showed marked improvement as did her claudication.  She does complain of some pain in her right inguinal area which does not sound vascular.   Current Meds  Medication Sig  . allopurinol (ZYLOPRIM) 100 MG  tablet Take 200 mg by mouth daily.  Marland Kitchen aspirin EC 81 MG tablet Take 81 mg by mouth daily.  Marland Kitchen atorvastatin (LIPITOR) 80 MG tablet TAKE 1 TABLET BY MOUTH ONCE DAILY AT  6  PM (Patient taking differently: Take 80 mg by mouth daily at 6 PM.)  . Blood Glucose Monitoring Suppl (ACCU-CHEK AVIVA PLUS) w/Device KIT   . calcium acetate (PHOSLO) 667 MG capsule Take 667 mg by mouth 3 (three) times daily with meals.   . cinacalcet (SENSIPAR) 60 MG tablet Take 60 mg by mouth daily.   . clopidogrel (PLAVIX) 75 MG tablet Take 1 tablet (75 mg total) by mouth daily.  Marland Kitchen gabapentin (NEURONTIN) 100 MG capsule Take 1 capsule by mouth every morning and 1 capsule at bedtime. PATIENT NEEDS OFFICE VISIT FOR ADDITIONAL REFILLS (Patient taking differently: Take 200 mg by mouth at bedtime.)  . glucose blood test strip Use to test blood sugar daily. Dx code: 84.02.  Marland Kitchen Lancets MISC Use to test blood sugar daily. Dx code 250.02.  . lidocaine-prilocaine (EMLA) cream Apply 1 application topically Every Tuesday,Thursday,and Saturday with dialysis.   . midodrine (PROAMATINE) 10 MG tablet Take 10 mg by mouth Every Tuesday,Thursday,and Saturday with dialysis.      Allergies  Allergen Reactions  . Tylenol [Acetaminophen] Rash    Social History   Socioeconomic History  . Marital status: Widowed    Spouse name: Not on file  . Number of children: Not on file  . Years of education: Not on file  . Highest education level: Not on file  Occupational History  . Not on file  Tobacco Use  . Smoking status: Former Smoker    Years:  10.00    Types: Cigarettes    Quit date: 1970    Years since quitting: 52.3  . Smokeless tobacco: Never Used  . Tobacco comment: "never a heavy smoker; smoked off and on"  Vaping Use  . Vaping Use: Never used  Substance and Sexual Activity  . Alcohol use: Not Currently    Comment: "in my younger days"  . Drug use: Not Currently    Types: Marijuana    Comment: "weed in my teens"  . Sexual  activity: Not Currently    Birth control/protection: Post-menopausal  Other Topics Concern  . Not on file  Social History Narrative  . Not on file   Social Determinants of Health   Financial Resource Strain: Not on file  Food Insecurity: Not on file  Transportation Needs: Not on file  Physical Activity: Not on file  Stress: Not on file  Social Connections: Not on file  Intimate Partner Violence: Not on file     Review of Systems: General: negative for chills, fever, night sweats or weight changes.  Cardiovascular: negative for chest pain, dyspnea on exertion, edema, orthopnea, palpitations, paroxysmal nocturnal dyspnea or shortness of breath Dermatological: negative for rash Respiratory: negative for cough or wheezing Urologic: negative for hematuria Abdominal: negative for nausea, vomiting, diarrhea, bright red blood per rectum, melena, or hematemesis Neurologic: negative for visual changes, syncope, or dizziness All other systems reviewed and are otherwise negative except as noted above.    Blood pressure (!) 122/56, pulse 76, height _0  (1.626 m), weight 169 lb 3.2 oz (76.7 kg), SpO2 99 %.  General appearance: alert and no distress Neck: no adenopathy, no JVD, supple, symmetrical, trachea midline, thyroid not enlarged, symmetric, no tenderness/mass/nodules and Bilateral carotid bruits versus transmitted murmur Lungs: clear to auscultation bilaterally Heart: Soft outflow tract murmur consistent with aortic stenosis and/or sclerosis. Extremities: extremities normal, atraumatic, no cyanosis or edema Pulses: 2+ and symmetric Skin: Skin color, texture, turgor normal. No rashes or lesions Neurologic: Alert and oriented X 3, normal strength and tone. Normal symmetric reflexes. Normal coordination and gait  EKG sinus rhythm at 76 with low limb voltage and nonspecific ST and T wave changes.  There was reverse R wave progression.  I personally reviewed this EKG.  ASSESSMENT AND  PLAN:   HYPERCHOLESTEROLEMIA History of hyperlipidemia on atorvastatin with lipid profile performed 09/02/2020 revealing a total cholesterol 112, LDL of 64 and HDL of 29.  HYPERTENSION, BENIGN History of essential hypertension a blood pressure measured today at 122/56.  She is not on antihypertensive medications.  Critical lower limb ischemia History of critical limb ischemia with a nonhealing wound on her right great toe.  I angiogrammed her 01/30/2018 revealing a 99% calcified mid right SFA stenosis.  I performed a chocolate balloon atherectomy followed by drug-coated balloon angioplasty with excellent result.  Her Dopplers improved and her wound ultimately healed.  She was noticing some left lower extremity claudication.  Angiography performed 03/03/2020 revealed a patent left external iliac artery stent, high-grade diffuse segmental calcified left SFA stenosis which I intervened on using shockwave intravascular lithotripsy, and drug-coated balloon angioplasty.  She has three-vessel runoff.  Her claudication has not improved and her Dopplers normalized and he remained widely patent.      Lorretta Harp MD FACP,FACC,FAHA, [facility] 10/10/2020 10:31 AM

## 2020-10-11 DIAGNOSIS — Z992 Dependence on renal dialysis: Secondary | ICD-10-CM | POA: Diagnosis not present

## 2020-10-11 DIAGNOSIS — N186 End stage renal disease: Secondary | ICD-10-CM | POA: Diagnosis not present

## 2020-10-11 DIAGNOSIS — N2581 Secondary hyperparathyroidism of renal origin: Secondary | ICD-10-CM | POA: Diagnosis not present

## 2020-10-14 DIAGNOSIS — N2581 Secondary hyperparathyroidism of renal origin: Secondary | ICD-10-CM | POA: Diagnosis not present

## 2020-10-14 DIAGNOSIS — N186 End stage renal disease: Secondary | ICD-10-CM | POA: Diagnosis not present

## 2020-10-14 DIAGNOSIS — Z992 Dependence on renal dialysis: Secondary | ICD-10-CM | POA: Diagnosis not present

## 2020-10-15 DIAGNOSIS — M545 Low back pain, unspecified: Secondary | ICD-10-CM | POA: Diagnosis not present

## 2020-10-15 DIAGNOSIS — M5416 Radiculopathy, lumbar region: Secondary | ICD-10-CM | POA: Insufficient documentation

## 2020-10-16 DIAGNOSIS — N186 End stage renal disease: Secondary | ICD-10-CM | POA: Diagnosis not present

## 2020-10-16 DIAGNOSIS — Z992 Dependence on renal dialysis: Secondary | ICD-10-CM | POA: Diagnosis not present

## 2020-10-16 DIAGNOSIS — N2581 Secondary hyperparathyroidism of renal origin: Secondary | ICD-10-CM | POA: Diagnosis not present

## 2020-10-18 DIAGNOSIS — N186 End stage renal disease: Secondary | ICD-10-CM | POA: Diagnosis not present

## 2020-10-18 DIAGNOSIS — N2581 Secondary hyperparathyroidism of renal origin: Secondary | ICD-10-CM | POA: Diagnosis not present

## 2020-10-18 DIAGNOSIS — Z992 Dependence on renal dialysis: Secondary | ICD-10-CM | POA: Diagnosis not present

## 2020-10-20 ENCOUNTER — Other Ambulatory Visit: Payer: Self-pay

## 2020-10-20 ENCOUNTER — Ambulatory Visit (HOSPITAL_COMMUNITY)
Admission: RE | Admit: 2020-10-20 | Discharge: 2020-10-20 | Disposition: A | Discharge status: Home / Self Care | Payer: Medicare HMO | Source: Ambulatory Visit | Attending: Cardiovascular Disease | Admitting: Cardiovascular Disease

## 2020-10-20 DIAGNOSIS — I10 Essential (primary) hypertension: Secondary | ICD-10-CM | POA: Diagnosis not present

## 2020-10-20 DIAGNOSIS — E7800 Pure hypercholesterolemia, unspecified: Secondary | ICD-10-CM | POA: Diagnosis not present

## 2020-10-20 DIAGNOSIS — R0989 Other specified symptoms and signs involving the circulatory and respiratory systems: Secondary | ICD-10-CM | POA: Insufficient documentation

## 2020-10-21 DIAGNOSIS — Z992 Dependence on renal dialysis: Secondary | ICD-10-CM | POA: Diagnosis not present

## 2020-10-21 DIAGNOSIS — N2581 Secondary hyperparathyroidism of renal origin: Secondary | ICD-10-CM | POA: Diagnosis not present

## 2020-10-21 DIAGNOSIS — N186 End stage renal disease: Secondary | ICD-10-CM | POA: Diagnosis not present

## 2020-10-22 DIAGNOSIS — M5416 Radiculopathy, lumbar region: Secondary | ICD-10-CM | POA: Diagnosis not present

## 2020-10-22 DIAGNOSIS — M545 Low back pain, unspecified: Secondary | ICD-10-CM | POA: Diagnosis not present

## 2020-10-23 DIAGNOSIS — N186 End stage renal disease: Secondary | ICD-10-CM | POA: Diagnosis not present

## 2020-10-23 DIAGNOSIS — Z992 Dependence on renal dialysis: Secondary | ICD-10-CM | POA: Diagnosis not present

## 2020-10-23 DIAGNOSIS — N2581 Secondary hyperparathyroidism of renal origin: Secondary | ICD-10-CM | POA: Diagnosis not present

## 2020-10-24 DIAGNOSIS — M545 Low back pain, unspecified: Secondary | ICD-10-CM | POA: Diagnosis not present

## 2020-10-24 DIAGNOSIS — M5416 Radiculopathy, lumbar region: Secondary | ICD-10-CM | POA: Diagnosis not present

## 2020-10-25 DIAGNOSIS — N2581 Secondary hyperparathyroidism of renal origin: Secondary | ICD-10-CM | POA: Diagnosis not present

## 2020-10-25 DIAGNOSIS — N186 End stage renal disease: Secondary | ICD-10-CM | POA: Diagnosis not present

## 2020-10-25 DIAGNOSIS — Z992 Dependence on renal dialysis: Secondary | ICD-10-CM | POA: Diagnosis not present

## 2020-10-28 DIAGNOSIS — N2581 Secondary hyperparathyroidism of renal origin: Secondary | ICD-10-CM | POA: Diagnosis not present

## 2020-10-28 DIAGNOSIS — Z992 Dependence on renal dialysis: Secondary | ICD-10-CM | POA: Diagnosis not present

## 2020-10-28 DIAGNOSIS — N186 End stage renal disease: Secondary | ICD-10-CM | POA: Diagnosis not present

## 2020-10-30 DIAGNOSIS — N2581 Secondary hyperparathyroidism of renal origin: Secondary | ICD-10-CM | POA: Diagnosis not present

## 2020-10-30 DIAGNOSIS — Z992 Dependence on renal dialysis: Secondary | ICD-10-CM | POA: Diagnosis not present

## 2020-10-30 DIAGNOSIS — N186 End stage renal disease: Secondary | ICD-10-CM | POA: Diagnosis not present

## 2020-11-01 DIAGNOSIS — N2581 Secondary hyperparathyroidism of renal origin: Secondary | ICD-10-CM | POA: Diagnosis not present

## 2020-11-01 DIAGNOSIS — E1122 Type 2 diabetes mellitus with diabetic chronic kidney disease: Secondary | ICD-10-CM | POA: Diagnosis not present

## 2020-11-01 DIAGNOSIS — N186 End stage renal disease: Secondary | ICD-10-CM | POA: Diagnosis not present

## 2020-11-01 DIAGNOSIS — Z992 Dependence on renal dialysis: Secondary | ICD-10-CM | POA: Diagnosis not present

## 2020-11-04 DIAGNOSIS — Z992 Dependence on renal dialysis: Secondary | ICD-10-CM | POA: Diagnosis not present

## 2020-11-04 DIAGNOSIS — N2581 Secondary hyperparathyroidism of renal origin: Secondary | ICD-10-CM | POA: Diagnosis not present

## 2020-11-04 DIAGNOSIS — N186 End stage renal disease: Secondary | ICD-10-CM | POA: Diagnosis not present

## 2020-11-05 ENCOUNTER — Ambulatory Visit
Admission: RE | Admit: 2020-11-05 | Discharge: 2020-11-05 | Disposition: A | Discharge status: Home / Self Care | Payer: Medicare HMO | Source: Ambulatory Visit | Attending: Family Medicine | Admitting: Family Medicine

## 2020-11-05 ENCOUNTER — Other Ambulatory Visit: Payer: Self-pay

## 2020-11-05 DIAGNOSIS — Z1231 Encounter for screening mammogram for malignant neoplasm of breast: Secondary | ICD-10-CM | POA: Diagnosis not present

## 2020-11-05 DIAGNOSIS — Z0000 Encounter for general adult medical examination without abnormal findings: Secondary | ICD-10-CM

## 2020-11-06 DIAGNOSIS — N186 End stage renal disease: Secondary | ICD-10-CM | POA: Diagnosis not present

## 2020-11-06 DIAGNOSIS — Z992 Dependence on renal dialysis: Secondary | ICD-10-CM | POA: Diagnosis not present

## 2020-11-06 DIAGNOSIS — N2581 Secondary hyperparathyroidism of renal origin: Secondary | ICD-10-CM | POA: Diagnosis not present

## 2020-11-07 DIAGNOSIS — M5416 Radiculopathy, lumbar region: Secondary | ICD-10-CM | POA: Diagnosis not present

## 2020-11-07 DIAGNOSIS — M545 Low back pain, unspecified: Secondary | ICD-10-CM | POA: Diagnosis not present

## 2020-11-08 DIAGNOSIS — N2581 Secondary hyperparathyroidism of renal origin: Secondary | ICD-10-CM | POA: Diagnosis not present

## 2020-11-08 DIAGNOSIS — Z992 Dependence on renal dialysis: Secondary | ICD-10-CM | POA: Diagnosis not present

## 2020-11-08 DIAGNOSIS — N186 End stage renal disease: Secondary | ICD-10-CM | POA: Diagnosis not present

## 2020-11-11 DIAGNOSIS — N186 End stage renal disease: Secondary | ICD-10-CM | POA: Diagnosis not present

## 2020-11-11 DIAGNOSIS — Z992 Dependence on renal dialysis: Secondary | ICD-10-CM | POA: Diagnosis not present

## 2020-11-11 DIAGNOSIS — N2581 Secondary hyperparathyroidism of renal origin: Secondary | ICD-10-CM | POA: Diagnosis not present

## 2020-11-12 DIAGNOSIS — M545 Low back pain, unspecified: Secondary | ICD-10-CM | POA: Diagnosis not present

## 2020-11-12 DIAGNOSIS — M5416 Radiculopathy, lumbar region: Secondary | ICD-10-CM | POA: Diagnosis not present

## 2020-11-13 DIAGNOSIS — N2581 Secondary hyperparathyroidism of renal origin: Secondary | ICD-10-CM | POA: Diagnosis not present

## 2020-11-13 DIAGNOSIS — Z992 Dependence on renal dialysis: Secondary | ICD-10-CM | POA: Diagnosis not present

## 2020-11-13 DIAGNOSIS — N186 End stage renal disease: Secondary | ICD-10-CM | POA: Diagnosis not present

## 2020-11-14 DIAGNOSIS — M545 Low back pain, unspecified: Secondary | ICD-10-CM | POA: Diagnosis not present

## 2020-11-14 DIAGNOSIS — M5416 Radiculopathy, lumbar region: Secondary | ICD-10-CM | POA: Diagnosis not present

## 2020-11-15 DIAGNOSIS — N2581 Secondary hyperparathyroidism of renal origin: Secondary | ICD-10-CM | POA: Diagnosis not present

## 2020-11-15 DIAGNOSIS — Z992 Dependence on renal dialysis: Secondary | ICD-10-CM | POA: Diagnosis not present

## 2020-11-15 DIAGNOSIS — N186 End stage renal disease: Secondary | ICD-10-CM | POA: Diagnosis not present

## 2020-11-17 ENCOUNTER — Ambulatory Visit (HOSPITAL_COMMUNITY): Payer: Medicare HMO | Attending: Cardiology

## 2020-11-17 ENCOUNTER — Other Ambulatory Visit: Payer: Self-pay

## 2020-11-17 ENCOUNTER — Ambulatory Visit
Admission: RE | Admit: 2020-11-17 | Discharge: 2020-11-17 | Disposition: A | Discharge status: Home / Self Care | Payer: Medicare HMO | Source: Ambulatory Visit | Attending: Family Medicine | Admitting: Family Medicine

## 2020-11-17 DIAGNOSIS — E7800 Pure hypercholesterolemia, unspecified: Secondary | ICD-10-CM

## 2020-11-17 DIAGNOSIS — I10 Essential (primary) hypertension: Secondary | ICD-10-CM | POA: Diagnosis not present

## 2020-11-17 DIAGNOSIS — M810 Age-related osteoporosis without current pathological fracture: Secondary | ICD-10-CM

## 2020-11-17 DIAGNOSIS — M8588 Other specified disorders of bone density and structure, other site: Secondary | ICD-10-CM | POA: Diagnosis not present

## 2020-11-17 DIAGNOSIS — R011 Cardiac murmur, unspecified: Secondary | ICD-10-CM | POA: Diagnosis not present

## 2020-11-17 LAB — ECHOCARDIOGRAM COMPLETE
AR max vel: 1.54 cm2
AV Area VTI: 1.56 cm2
AV Area mean vel: 1.59 cm2
AV Mean grad: 11 mmHg
AV Peak grad: 19.9 mmHg
Ao pk vel: 2.23 m/s
Area-P 1/2: 2.8 cm2
S' Lateral: 3.45 cm

## 2020-11-18 DIAGNOSIS — Z992 Dependence on renal dialysis: Secondary | ICD-10-CM | POA: Diagnosis not present

## 2020-11-18 DIAGNOSIS — N186 End stage renal disease: Secondary | ICD-10-CM | POA: Diagnosis not present

## 2020-11-18 DIAGNOSIS — N2581 Secondary hyperparathyroidism of renal origin: Secondary | ICD-10-CM | POA: Diagnosis not present

## 2020-11-20 DIAGNOSIS — N186 End stage renal disease: Secondary | ICD-10-CM | POA: Diagnosis not present

## 2020-11-20 DIAGNOSIS — Z992 Dependence on renal dialysis: Secondary | ICD-10-CM | POA: Diagnosis not present

## 2020-11-20 DIAGNOSIS — N2581 Secondary hyperparathyroidism of renal origin: Secondary | ICD-10-CM | POA: Diagnosis not present

## 2020-11-22 DIAGNOSIS — N2581 Secondary hyperparathyroidism of renal origin: Secondary | ICD-10-CM | POA: Diagnosis not present

## 2020-11-22 DIAGNOSIS — N186 End stage renal disease: Secondary | ICD-10-CM | POA: Diagnosis not present

## 2020-11-22 DIAGNOSIS — Z992 Dependence on renal dialysis: Secondary | ICD-10-CM | POA: Diagnosis not present

## 2020-11-25 DIAGNOSIS — Z992 Dependence on renal dialysis: Secondary | ICD-10-CM | POA: Diagnosis not present

## 2020-11-25 DIAGNOSIS — N186 End stage renal disease: Secondary | ICD-10-CM | POA: Diagnosis not present

## 2020-11-25 DIAGNOSIS — N2581 Secondary hyperparathyroidism of renal origin: Secondary | ICD-10-CM | POA: Diagnosis not present

## 2020-11-27 DIAGNOSIS — N186 End stage renal disease: Secondary | ICD-10-CM | POA: Diagnosis not present

## 2020-11-27 DIAGNOSIS — N2581 Secondary hyperparathyroidism of renal origin: Secondary | ICD-10-CM | POA: Diagnosis not present

## 2020-11-27 DIAGNOSIS — Z992 Dependence on renal dialysis: Secondary | ICD-10-CM | POA: Diagnosis not present

## 2020-11-29 DIAGNOSIS — Z992 Dependence on renal dialysis: Secondary | ICD-10-CM | POA: Diagnosis not present

## 2020-11-29 DIAGNOSIS — N186 End stage renal disease: Secondary | ICD-10-CM | POA: Diagnosis not present

## 2020-11-29 DIAGNOSIS — N2581 Secondary hyperparathyroidism of renal origin: Secondary | ICD-10-CM | POA: Diagnosis not present

## 2020-12-02 DIAGNOSIS — N186 End stage renal disease: Secondary | ICD-10-CM | POA: Diagnosis not present

## 2020-12-02 DIAGNOSIS — N2581 Secondary hyperparathyroidism of renal origin: Secondary | ICD-10-CM | POA: Diagnosis not present

## 2020-12-02 DIAGNOSIS — Z992 Dependence on renal dialysis: Secondary | ICD-10-CM | POA: Diagnosis not present

## 2020-12-02 DIAGNOSIS — E1122 Type 2 diabetes mellitus with diabetic chronic kidney disease: Secondary | ICD-10-CM | POA: Diagnosis not present

## 2020-12-04 DIAGNOSIS — Z992 Dependence on renal dialysis: Secondary | ICD-10-CM | POA: Diagnosis not present

## 2020-12-04 DIAGNOSIS — N186 End stage renal disease: Secondary | ICD-10-CM | POA: Diagnosis not present

## 2020-12-04 DIAGNOSIS — N2581 Secondary hyperparathyroidism of renal origin: Secondary | ICD-10-CM | POA: Diagnosis not present

## 2020-12-06 DIAGNOSIS — N186 End stage renal disease: Secondary | ICD-10-CM | POA: Diagnosis not present

## 2020-12-06 DIAGNOSIS — Z992 Dependence on renal dialysis: Secondary | ICD-10-CM | POA: Diagnosis not present

## 2020-12-06 DIAGNOSIS — N2581 Secondary hyperparathyroidism of renal origin: Secondary | ICD-10-CM | POA: Diagnosis not present

## 2020-12-09 DIAGNOSIS — Z992 Dependence on renal dialysis: Secondary | ICD-10-CM | POA: Diagnosis not present

## 2020-12-09 DIAGNOSIS — N2581 Secondary hyperparathyroidism of renal origin: Secondary | ICD-10-CM | POA: Diagnosis not present

## 2020-12-09 DIAGNOSIS — N186 End stage renal disease: Secondary | ICD-10-CM | POA: Diagnosis not present

## 2020-12-11 DIAGNOSIS — N2581 Secondary hyperparathyroidism of renal origin: Secondary | ICD-10-CM | POA: Diagnosis not present

## 2020-12-11 DIAGNOSIS — Z992 Dependence on renal dialysis: Secondary | ICD-10-CM | POA: Diagnosis not present

## 2020-12-11 DIAGNOSIS — N186 End stage renal disease: Secondary | ICD-10-CM | POA: Diagnosis not present

## 2020-12-13 DIAGNOSIS — N186 End stage renal disease: Secondary | ICD-10-CM | POA: Diagnosis not present

## 2020-12-13 DIAGNOSIS — N2581 Secondary hyperparathyroidism of renal origin: Secondary | ICD-10-CM | POA: Diagnosis not present

## 2020-12-13 DIAGNOSIS — Z992 Dependence on renal dialysis: Secondary | ICD-10-CM | POA: Diagnosis not present

## 2020-12-16 DIAGNOSIS — N2581 Secondary hyperparathyroidism of renal origin: Secondary | ICD-10-CM | POA: Diagnosis not present

## 2020-12-16 DIAGNOSIS — Z992 Dependence on renal dialysis: Secondary | ICD-10-CM | POA: Diagnosis not present

## 2020-12-16 DIAGNOSIS — N186 End stage renal disease: Secondary | ICD-10-CM | POA: Diagnosis not present

## 2020-12-18 DIAGNOSIS — Z992 Dependence on renal dialysis: Secondary | ICD-10-CM | POA: Diagnosis not present

## 2020-12-18 DIAGNOSIS — N2581 Secondary hyperparathyroidism of renal origin: Secondary | ICD-10-CM | POA: Diagnosis not present

## 2020-12-18 DIAGNOSIS — N186 End stage renal disease: Secondary | ICD-10-CM | POA: Diagnosis not present

## 2020-12-20 DIAGNOSIS — N186 End stage renal disease: Secondary | ICD-10-CM | POA: Diagnosis not present

## 2020-12-20 DIAGNOSIS — N2581 Secondary hyperparathyroidism of renal origin: Secondary | ICD-10-CM | POA: Diagnosis not present

## 2020-12-20 DIAGNOSIS — Z992 Dependence on renal dialysis: Secondary | ICD-10-CM | POA: Diagnosis not present

## 2020-12-23 DIAGNOSIS — N2581 Secondary hyperparathyroidism of renal origin: Secondary | ICD-10-CM | POA: Diagnosis not present

## 2020-12-23 DIAGNOSIS — N186 End stage renal disease: Secondary | ICD-10-CM | POA: Diagnosis not present

## 2020-12-23 DIAGNOSIS — Z992 Dependence on renal dialysis: Secondary | ICD-10-CM | POA: Diagnosis not present

## 2020-12-25 DIAGNOSIS — N186 End stage renal disease: Secondary | ICD-10-CM | POA: Diagnosis not present

## 2020-12-25 DIAGNOSIS — Z992 Dependence on renal dialysis: Secondary | ICD-10-CM | POA: Diagnosis not present

## 2020-12-25 DIAGNOSIS — N2581 Secondary hyperparathyroidism of renal origin: Secondary | ICD-10-CM | POA: Diagnosis not present

## 2020-12-27 DIAGNOSIS — N2581 Secondary hyperparathyroidism of renal origin: Secondary | ICD-10-CM | POA: Diagnosis not present

## 2020-12-27 DIAGNOSIS — N186 End stage renal disease: Secondary | ICD-10-CM | POA: Diagnosis not present

## 2020-12-27 DIAGNOSIS — Z992 Dependence on renal dialysis: Secondary | ICD-10-CM | POA: Diagnosis not present

## 2020-12-30 DIAGNOSIS — N186 End stage renal disease: Secondary | ICD-10-CM | POA: Diagnosis not present

## 2020-12-30 DIAGNOSIS — N2581 Secondary hyperparathyroidism of renal origin: Secondary | ICD-10-CM | POA: Diagnosis not present

## 2020-12-30 DIAGNOSIS — Z992 Dependence on renal dialysis: Secondary | ICD-10-CM | POA: Diagnosis not present

## 2021-01-01 DIAGNOSIS — E1122 Type 2 diabetes mellitus with diabetic chronic kidney disease: Secondary | ICD-10-CM | POA: Diagnosis not present

## 2021-01-01 DIAGNOSIS — N2581 Secondary hyperparathyroidism of renal origin: Secondary | ICD-10-CM | POA: Diagnosis not present

## 2021-01-01 DIAGNOSIS — Z23 Encounter for immunization: Secondary | ICD-10-CM | POA: Diagnosis not present

## 2021-01-01 DIAGNOSIS — N186 End stage renal disease: Secondary | ICD-10-CM | POA: Diagnosis not present

## 2021-01-01 DIAGNOSIS — Z992 Dependence on renal dialysis: Secondary | ICD-10-CM | POA: Diagnosis not present

## 2021-01-03 DIAGNOSIS — Z992 Dependence on renal dialysis: Secondary | ICD-10-CM | POA: Diagnosis not present

## 2021-01-03 DIAGNOSIS — N186 End stage renal disease: Secondary | ICD-10-CM | POA: Diagnosis not present

## 2021-01-03 DIAGNOSIS — N2581 Secondary hyperparathyroidism of renal origin: Secondary | ICD-10-CM | POA: Diagnosis not present

## 2021-01-06 DIAGNOSIS — N186 End stage renal disease: Secondary | ICD-10-CM | POA: Diagnosis not present

## 2021-01-06 DIAGNOSIS — Z992 Dependence on renal dialysis: Secondary | ICD-10-CM | POA: Diagnosis not present

## 2021-01-06 DIAGNOSIS — N2581 Secondary hyperparathyroidism of renal origin: Secondary | ICD-10-CM | POA: Diagnosis not present

## 2021-01-08 DIAGNOSIS — Z992 Dependence on renal dialysis: Secondary | ICD-10-CM | POA: Diagnosis not present

## 2021-01-08 DIAGNOSIS — N2581 Secondary hyperparathyroidism of renal origin: Secondary | ICD-10-CM | POA: Diagnosis not present

## 2021-01-08 DIAGNOSIS — N186 End stage renal disease: Secondary | ICD-10-CM | POA: Diagnosis not present

## 2021-01-10 DIAGNOSIS — N2581 Secondary hyperparathyroidism of renal origin: Secondary | ICD-10-CM | POA: Diagnosis not present

## 2021-01-10 DIAGNOSIS — Z992 Dependence on renal dialysis: Secondary | ICD-10-CM | POA: Diagnosis not present

## 2021-01-10 DIAGNOSIS — N186 End stage renal disease: Secondary | ICD-10-CM | POA: Diagnosis not present

## 2021-01-13 DIAGNOSIS — N2581 Secondary hyperparathyroidism of renal origin: Secondary | ICD-10-CM | POA: Diagnosis not present

## 2021-01-13 DIAGNOSIS — Z992 Dependence on renal dialysis: Secondary | ICD-10-CM | POA: Diagnosis not present

## 2021-01-13 DIAGNOSIS — N186 End stage renal disease: Secondary | ICD-10-CM | POA: Diagnosis not present

## 2021-01-15 DIAGNOSIS — N2581 Secondary hyperparathyroidism of renal origin: Secondary | ICD-10-CM | POA: Diagnosis not present

## 2021-01-15 DIAGNOSIS — N186 End stage renal disease: Secondary | ICD-10-CM | POA: Diagnosis not present

## 2021-01-15 DIAGNOSIS — Z992 Dependence on renal dialysis: Secondary | ICD-10-CM | POA: Diagnosis not present

## 2021-01-17 DIAGNOSIS — N2581 Secondary hyperparathyroidism of renal origin: Secondary | ICD-10-CM | POA: Diagnosis not present

## 2021-01-17 DIAGNOSIS — N186 End stage renal disease: Secondary | ICD-10-CM | POA: Diagnosis not present

## 2021-01-17 DIAGNOSIS — Z992 Dependence on renal dialysis: Secondary | ICD-10-CM | POA: Diagnosis not present

## 2021-01-20 ENCOUNTER — Telehealth: Payer: Self-pay | Admitting: Cardiovascular Disease

## 2021-01-20 DIAGNOSIS — N2581 Secondary hyperparathyroidism of renal origin: Secondary | ICD-10-CM | POA: Diagnosis not present

## 2021-01-20 DIAGNOSIS — Z992 Dependence on renal dialysis: Secondary | ICD-10-CM | POA: Diagnosis not present

## 2021-01-20 DIAGNOSIS — N186 End stage renal disease: Secondary | ICD-10-CM | POA: Diagnosis not present

## 2021-01-20 NOTE — Telephone Encounter (Signed)
Routed to [employer]

## 2021-01-20 NOTE — Telephone Encounter (Signed)
Megan from [facility] was trying to verify a CPT code for this patient. The CPT Code is 13643 and the Date of Service was 8.30.21. Please assist

## 2021-01-22 DIAGNOSIS — Z992 Dependence on renal dialysis: Secondary | ICD-10-CM | POA: Diagnosis not present

## 2021-01-22 DIAGNOSIS — N2581 Secondary hyperparathyroidism of renal origin: Secondary | ICD-10-CM | POA: Diagnosis not present

## 2021-01-22 DIAGNOSIS — N186 End stage renal disease: Secondary | ICD-10-CM | POA: Diagnosis not present

## 2021-01-24 DIAGNOSIS — Z992 Dependence on renal dialysis: Secondary | ICD-10-CM | POA: Diagnosis not present

## 2021-01-24 DIAGNOSIS — N186 End stage renal disease: Secondary | ICD-10-CM | POA: Diagnosis not present

## 2021-01-24 DIAGNOSIS — N2581 Secondary hyperparathyroidism of renal origin: Secondary | ICD-10-CM | POA: Diagnosis not present

## 2021-01-27 DIAGNOSIS — Z992 Dependence on renal dialysis: Secondary | ICD-10-CM | POA: Diagnosis not present

## 2021-01-27 DIAGNOSIS — N2581 Secondary hyperparathyroidism of renal origin: Secondary | ICD-10-CM | POA: Diagnosis not present

## 2021-01-27 DIAGNOSIS — N186 End stage renal disease: Secondary | ICD-10-CM | POA: Diagnosis not present

## 2021-01-29 DIAGNOSIS — Z992 Dependence on renal dialysis: Secondary | ICD-10-CM | POA: Diagnosis not present

## 2021-01-29 DIAGNOSIS — N186 End stage renal disease: Secondary | ICD-10-CM | POA: Diagnosis not present

## 2021-01-29 DIAGNOSIS — N2581 Secondary hyperparathyroidism of renal origin: Secondary | ICD-10-CM | POA: Diagnosis not present

## 2021-01-31 DIAGNOSIS — N186 End stage renal disease: Secondary | ICD-10-CM | POA: Diagnosis not present

## 2021-01-31 DIAGNOSIS — N2581 Secondary hyperparathyroidism of renal origin: Secondary | ICD-10-CM | POA: Diagnosis not present

## 2021-01-31 DIAGNOSIS — Z992 Dependence on renal dialysis: Secondary | ICD-10-CM | POA: Diagnosis not present

## 2021-02-01 DIAGNOSIS — E1122 Type 2 diabetes mellitus with diabetic chronic kidney disease: Secondary | ICD-10-CM | POA: Diagnosis not present

## 2021-02-01 DIAGNOSIS — N186 End stage renal disease: Secondary | ICD-10-CM | POA: Diagnosis not present

## 2021-02-01 DIAGNOSIS — Z992 Dependence on renal dialysis: Secondary | ICD-10-CM | POA: Diagnosis not present

## 2021-02-03 DIAGNOSIS — Z992 Dependence on renal dialysis: Secondary | ICD-10-CM | POA: Diagnosis not present

## 2021-02-03 DIAGNOSIS — N2581 Secondary hyperparathyroidism of renal origin: Secondary | ICD-10-CM | POA: Diagnosis not present

## 2021-02-03 DIAGNOSIS — N186 End stage renal disease: Secondary | ICD-10-CM | POA: Diagnosis not present

## 2021-02-05 DIAGNOSIS — Z992 Dependence on renal dialysis: Secondary | ICD-10-CM | POA: Diagnosis not present

## 2021-02-05 DIAGNOSIS — N2581 Secondary hyperparathyroidism of renal origin: Secondary | ICD-10-CM | POA: Diagnosis not present

## 2021-02-05 DIAGNOSIS — N186 End stage renal disease: Secondary | ICD-10-CM | POA: Diagnosis not present

## 2021-02-07 DIAGNOSIS — Z992 Dependence on renal dialysis: Secondary | ICD-10-CM | POA: Diagnosis not present

## 2021-02-07 DIAGNOSIS — N186 End stage renal disease: Secondary | ICD-10-CM | POA: Diagnosis not present

## 2021-02-07 DIAGNOSIS — N2581 Secondary hyperparathyroidism of renal origin: Secondary | ICD-10-CM | POA: Diagnosis not present

## 2021-02-10 DIAGNOSIS — Z992 Dependence on renal dialysis: Secondary | ICD-10-CM | POA: Diagnosis not present

## 2021-02-10 DIAGNOSIS — N2581 Secondary hyperparathyroidism of renal origin: Secondary | ICD-10-CM | POA: Diagnosis not present

## 2021-02-10 DIAGNOSIS — N186 End stage renal disease: Secondary | ICD-10-CM | POA: Diagnosis not present

## 2021-02-12 DIAGNOSIS — N2581 Secondary hyperparathyroidism of renal origin: Secondary | ICD-10-CM | POA: Diagnosis not present

## 2021-02-12 DIAGNOSIS — Z992 Dependence on renal dialysis: Secondary | ICD-10-CM | POA: Diagnosis not present

## 2021-02-12 DIAGNOSIS — N186 End stage renal disease: Secondary | ICD-10-CM | POA: Diagnosis not present

## 2021-02-14 DIAGNOSIS — Z992 Dependence on renal dialysis: Secondary | ICD-10-CM | POA: Diagnosis not present

## 2021-02-14 DIAGNOSIS — N186 End stage renal disease: Secondary | ICD-10-CM | POA: Diagnosis not present

## 2021-02-14 DIAGNOSIS — N2581 Secondary hyperparathyroidism of renal origin: Secondary | ICD-10-CM | POA: Diagnosis not present

## 2021-02-17 DIAGNOSIS — Z992 Dependence on renal dialysis: Secondary | ICD-10-CM | POA: Diagnosis not present

## 2021-02-17 DIAGNOSIS — N186 End stage renal disease: Secondary | ICD-10-CM | POA: Diagnosis not present

## 2021-02-17 DIAGNOSIS — N2581 Secondary hyperparathyroidism of renal origin: Secondary | ICD-10-CM | POA: Diagnosis not present

## 2021-02-19 DIAGNOSIS — N2581 Secondary hyperparathyroidism of renal origin: Secondary | ICD-10-CM | POA: Diagnosis not present

## 2021-02-19 DIAGNOSIS — Z992 Dependence on renal dialysis: Secondary | ICD-10-CM | POA: Diagnosis not present

## 2021-02-19 DIAGNOSIS — N186 End stage renal disease: Secondary | ICD-10-CM | POA: Diagnosis not present

## 2021-02-21 DIAGNOSIS — N186 End stage renal disease: Secondary | ICD-10-CM | POA: Diagnosis not present

## 2021-02-21 DIAGNOSIS — Z992 Dependence on renal dialysis: Secondary | ICD-10-CM | POA: Diagnosis not present

## 2021-02-21 DIAGNOSIS — N2581 Secondary hyperparathyroidism of renal origin: Secondary | ICD-10-CM | POA: Diagnosis not present

## 2021-02-24 DIAGNOSIS — Z992 Dependence on renal dialysis: Secondary | ICD-10-CM | POA: Diagnosis not present

## 2021-02-24 DIAGNOSIS — N2581 Secondary hyperparathyroidism of renal origin: Secondary | ICD-10-CM | POA: Diagnosis not present

## 2021-02-24 DIAGNOSIS — N186 End stage renal disease: Secondary | ICD-10-CM | POA: Diagnosis not present

## 2021-02-25 ENCOUNTER — Other Ambulatory Visit: Payer: Self-pay | Admitting: Adult Health

## 2021-02-25 ENCOUNTER — Other Ambulatory Visit: Payer: Self-pay | Admitting: Cardiovascular Disease

## 2021-02-25 DIAGNOSIS — I871 Compression of vein: Secondary | ICD-10-CM | POA: Diagnosis not present

## 2021-02-25 DIAGNOSIS — N186 End stage renal disease: Secondary | ICD-10-CM | POA: Diagnosis not present

## 2021-02-25 DIAGNOSIS — Z992 Dependence on renal dialysis: Secondary | ICD-10-CM | POA: Diagnosis not present

## 2021-02-26 DIAGNOSIS — Z992 Dependence on renal dialysis: Secondary | ICD-10-CM | POA: Diagnosis not present

## 2021-02-26 DIAGNOSIS — N2581 Secondary hyperparathyroidism of renal origin: Secondary | ICD-10-CM | POA: Diagnosis not present

## 2021-02-26 DIAGNOSIS — N186 End stage renal disease: Secondary | ICD-10-CM | POA: Diagnosis not present

## 2021-02-28 DIAGNOSIS — N2581 Secondary hyperparathyroidism of renal origin: Secondary | ICD-10-CM | POA: Diagnosis not present

## 2021-02-28 DIAGNOSIS — N186 End stage renal disease: Secondary | ICD-10-CM | POA: Diagnosis not present

## 2021-02-28 DIAGNOSIS — Z992 Dependence on renal dialysis: Secondary | ICD-10-CM | POA: Diagnosis not present

## 2021-03-03 DIAGNOSIS — N2581 Secondary hyperparathyroidism of renal origin: Secondary | ICD-10-CM | POA: Diagnosis not present

## 2021-03-03 DIAGNOSIS — N186 End stage renal disease: Secondary | ICD-10-CM | POA: Diagnosis not present

## 2021-03-03 DIAGNOSIS — Z992 Dependence on renal dialysis: Secondary | ICD-10-CM | POA: Diagnosis not present

## 2021-03-04 DIAGNOSIS — N186 End stage renal disease: Secondary | ICD-10-CM | POA: Diagnosis not present

## 2021-03-04 DIAGNOSIS — E1122 Type 2 diabetes mellitus with diabetic chronic kidney disease: Secondary | ICD-10-CM | POA: Diagnosis not present

## 2021-03-04 DIAGNOSIS — Z992 Dependence on renal dialysis: Secondary | ICD-10-CM | POA: Diagnosis not present

## 2021-03-05 DIAGNOSIS — N186 End stage renal disease: Secondary | ICD-10-CM | POA: Diagnosis not present

## 2021-03-05 DIAGNOSIS — Z992 Dependence on renal dialysis: Secondary | ICD-10-CM | POA: Diagnosis not present

## 2021-03-05 DIAGNOSIS — N2581 Secondary hyperparathyroidism of renal origin: Secondary | ICD-10-CM | POA: Diagnosis not present

## 2021-03-07 DIAGNOSIS — N2581 Secondary hyperparathyroidism of renal origin: Secondary | ICD-10-CM | POA: Diagnosis not present

## 2021-03-07 DIAGNOSIS — N186 End stage renal disease: Secondary | ICD-10-CM | POA: Diagnosis not present

## 2021-03-07 DIAGNOSIS — Z992 Dependence on renal dialysis: Secondary | ICD-10-CM | POA: Diagnosis not present

## 2021-03-10 DIAGNOSIS — Z992 Dependence on renal dialysis: Secondary | ICD-10-CM | POA: Diagnosis not present

## 2021-03-10 DIAGNOSIS — N186 End stage renal disease: Secondary | ICD-10-CM | POA: Diagnosis not present

## 2021-03-10 DIAGNOSIS — N2581 Secondary hyperparathyroidism of renal origin: Secondary | ICD-10-CM | POA: Diagnosis not present

## 2021-03-12 DIAGNOSIS — N2581 Secondary hyperparathyroidism of renal origin: Secondary | ICD-10-CM | POA: Diagnosis not present

## 2021-03-12 DIAGNOSIS — Z992 Dependence on renal dialysis: Secondary | ICD-10-CM | POA: Diagnosis not present

## 2021-03-12 DIAGNOSIS — N186 End stage renal disease: Secondary | ICD-10-CM | POA: Diagnosis not present

## 2021-03-14 DIAGNOSIS — N186 End stage renal disease: Secondary | ICD-10-CM | POA: Diagnosis not present

## 2021-03-14 DIAGNOSIS — Z992 Dependence on renal dialysis: Secondary | ICD-10-CM | POA: Diagnosis not present

## 2021-03-14 DIAGNOSIS — N2581 Secondary hyperparathyroidism of renal origin: Secondary | ICD-10-CM | POA: Diagnosis not present

## 2021-03-17 DIAGNOSIS — Z992 Dependence on renal dialysis: Secondary | ICD-10-CM | POA: Diagnosis not present

## 2021-03-17 DIAGNOSIS — N186 End stage renal disease: Secondary | ICD-10-CM | POA: Diagnosis not present

## 2021-03-17 DIAGNOSIS — N2581 Secondary hyperparathyroidism of renal origin: Secondary | ICD-10-CM | POA: Diagnosis not present

## 2021-03-18 DIAGNOSIS — R319 Hematuria, unspecified: Secondary | ICD-10-CM | POA: Diagnosis not present

## 2021-03-19 DIAGNOSIS — N2581 Secondary hyperparathyroidism of renal origin: Secondary | ICD-10-CM | POA: Diagnosis not present

## 2021-03-19 DIAGNOSIS — Z992 Dependence on renal dialysis: Secondary | ICD-10-CM | POA: Diagnosis not present

## 2021-03-19 DIAGNOSIS — N186 End stage renal disease: Secondary | ICD-10-CM | POA: Diagnosis not present

## 2021-03-21 DIAGNOSIS — N186 End stage renal disease: Secondary | ICD-10-CM | POA: Diagnosis not present

## 2021-03-21 DIAGNOSIS — N2581 Secondary hyperparathyroidism of renal origin: Secondary | ICD-10-CM | POA: Diagnosis not present

## 2021-03-21 DIAGNOSIS — Z992 Dependence on renal dialysis: Secondary | ICD-10-CM | POA: Diagnosis not present

## 2021-03-24 DIAGNOSIS — Z992 Dependence on renal dialysis: Secondary | ICD-10-CM | POA: Diagnosis not present

## 2021-03-24 DIAGNOSIS — N2581 Secondary hyperparathyroidism of renal origin: Secondary | ICD-10-CM | POA: Diagnosis not present

## 2021-03-24 DIAGNOSIS — N186 End stage renal disease: Secondary | ICD-10-CM | POA: Diagnosis not present

## 2021-03-26 DIAGNOSIS — N2581 Secondary hyperparathyroidism of renal origin: Secondary | ICD-10-CM | POA: Diagnosis not present

## 2021-03-26 DIAGNOSIS — N186 End stage renal disease: Secondary | ICD-10-CM | POA: Diagnosis not present

## 2021-03-26 DIAGNOSIS — Z992 Dependence on renal dialysis: Secondary | ICD-10-CM | POA: Diagnosis not present

## 2021-03-27 DIAGNOSIS — E785 Hyperlipidemia, unspecified: Secondary | ICD-10-CM | POA: Diagnosis not present

## 2021-03-27 DIAGNOSIS — N185 Chronic kidney disease, stage 5: Secondary | ICD-10-CM | POA: Diagnosis not present

## 2021-03-27 DIAGNOSIS — E1140 Type 2 diabetes mellitus with diabetic neuropathy, unspecified: Secondary | ICD-10-CM | POA: Diagnosis not present

## 2021-03-27 DIAGNOSIS — M109 Gout, unspecified: Secondary | ICD-10-CM | POA: Diagnosis not present

## 2021-03-27 DIAGNOSIS — I10 Essential (primary) hypertension: Secondary | ICD-10-CM | POA: Diagnosis not present

## 2021-03-27 DIAGNOSIS — I739 Peripheral vascular disease, unspecified: Secondary | ICD-10-CM | POA: Diagnosis not present

## 2021-03-27 DIAGNOSIS — Z23 Encounter for immunization: Secondary | ICD-10-CM | POA: Diagnosis not present

## 2021-03-27 DIAGNOSIS — N2581 Secondary hyperparathyroidism of renal origin: Secondary | ICD-10-CM | POA: Diagnosis not present

## 2021-03-28 DIAGNOSIS — N2581 Secondary hyperparathyroidism of renal origin: Secondary | ICD-10-CM | POA: Diagnosis not present

## 2021-03-28 DIAGNOSIS — Z992 Dependence on renal dialysis: Secondary | ICD-10-CM | POA: Diagnosis not present

## 2021-03-28 DIAGNOSIS — N186 End stage renal disease: Secondary | ICD-10-CM | POA: Diagnosis not present

## 2021-03-31 DIAGNOSIS — N186 End stage renal disease: Secondary | ICD-10-CM | POA: Diagnosis not present

## 2021-03-31 DIAGNOSIS — Z992 Dependence on renal dialysis: Secondary | ICD-10-CM | POA: Diagnosis not present

## 2021-03-31 DIAGNOSIS — N2581 Secondary hyperparathyroidism of renal origin: Secondary | ICD-10-CM | POA: Diagnosis not present

## 2021-04-02 DIAGNOSIS — N186 End stage renal disease: Secondary | ICD-10-CM | POA: Diagnosis not present

## 2021-04-02 DIAGNOSIS — N2581 Secondary hyperparathyroidism of renal origin: Secondary | ICD-10-CM | POA: Diagnosis not present

## 2021-04-02 DIAGNOSIS — Z992 Dependence on renal dialysis: Secondary | ICD-10-CM | POA: Diagnosis not present

## 2021-04-03 ENCOUNTER — Ambulatory Visit (INDEPENDENT_AMBULATORY_CARE_PROVIDER_SITE_OTHER): Payer: Medicare HMO | Admitting: Podiatry

## 2021-04-03 ENCOUNTER — Telehealth: Payer: Self-pay | Admitting: Cardiovascular Disease

## 2021-04-03 ENCOUNTER — Other Ambulatory Visit: Payer: Self-pay

## 2021-04-03 DIAGNOSIS — L97521 Non-pressure chronic ulcer of other part of left foot limited to breakdown of skin: Secondary | ICD-10-CM

## 2021-04-03 DIAGNOSIS — I96 Gangrene, not elsewhere classified: Secondary | ICD-10-CM | POA: Diagnosis not present

## 2021-04-03 DIAGNOSIS — N186 End stage renal disease: Secondary | ICD-10-CM | POA: Diagnosis not present

## 2021-04-03 DIAGNOSIS — I70229 Atherosclerosis of native arteries of extremities with rest pain, unspecified extremity: Secondary | ICD-10-CM | POA: Diagnosis not present

## 2021-04-03 DIAGNOSIS — Z992 Dependence on renal dialysis: Secondary | ICD-10-CM | POA: Diagnosis not present

## 2021-04-03 DIAGNOSIS — E1122 Type 2 diabetes mellitus with diabetic chronic kidney disease: Secondary | ICD-10-CM | POA: Diagnosis not present

## 2021-04-03 NOTE — Telephone Encounter (Signed)
error 

## 2021-04-04 DIAGNOSIS — N2581 Secondary hyperparathyroidism of renal origin: Secondary | ICD-10-CM | POA: Diagnosis not present

## 2021-04-04 DIAGNOSIS — Z992 Dependence on renal dialysis: Secondary | ICD-10-CM | POA: Diagnosis not present

## 2021-04-04 DIAGNOSIS — N186 End stage renal disease: Secondary | ICD-10-CM | POA: Diagnosis not present

## 2021-04-06 ENCOUNTER — Ambulatory Visit (HOSPITAL_COMMUNITY)
Admission: RE | Admit: 2021-04-06 | Discharge: 2021-04-06 | Disposition: A | Discharge status: Home / Self Care | Payer: Medicare HMO | Source: Ambulatory Visit | Attending: Podiatry | Admitting: Podiatry

## 2021-04-06 ENCOUNTER — Other Ambulatory Visit: Payer: Self-pay

## 2021-04-06 DIAGNOSIS — I70229 Atherosclerosis of native arteries of extremities with rest pain, unspecified extremity: Secondary | ICD-10-CM | POA: Diagnosis not present

## 2021-04-07 DIAGNOSIS — Z992 Dependence on renal dialysis: Secondary | ICD-10-CM | POA: Diagnosis not present

## 2021-04-07 DIAGNOSIS — N186 End stage renal disease: Secondary | ICD-10-CM | POA: Diagnosis not present

## 2021-04-07 DIAGNOSIS — N2581 Secondary hyperparathyroidism of renal origin: Secondary | ICD-10-CM | POA: Diagnosis not present

## 2021-04-08 ENCOUNTER — Ambulatory Visit (INDEPENDENT_AMBULATORY_CARE_PROVIDER_SITE_OTHER): Payer: Medicare HMO | Admitting: Cardiovascular Disease

## 2021-04-08 ENCOUNTER — Encounter: Payer: Self-pay | Admitting: Cardiovascular Disease

## 2021-04-08 ENCOUNTER — Other Ambulatory Visit: Payer: Self-pay

## 2021-04-08 VITALS — BP 118/58 | HR 77 | Ht 64.0 in | Wt 171.4 lb

## 2021-04-08 DIAGNOSIS — I739 Peripheral vascular disease, unspecified: Secondary | ICD-10-CM | POA: Diagnosis not present

## 2021-04-08 DIAGNOSIS — I10 Essential (primary) hypertension: Secondary | ICD-10-CM | POA: Diagnosis not present

## 2021-04-08 DIAGNOSIS — I350 Nonrheumatic aortic (valve) stenosis: Secondary | ICD-10-CM

## 2021-04-08 DIAGNOSIS — I70229 Atherosclerosis of native arteries of extremities with rest pain, unspecified extremity: Secondary | ICD-10-CM | POA: Diagnosis not present

## 2021-04-08 DIAGNOSIS — E7800 Pure hypercholesterolemia, unspecified: Secondary | ICD-10-CM

## 2021-04-08 NOTE — Assessment & Plan Note (Signed)
History of essential hypertension a blood pressure measured today at 118/58.  She currently is not on antihypertensive medications.

## 2021-04-08 NOTE — Progress Notes (Signed)
04/08/2021 Renae Mottley Dick   [birth year]  [id number]  Primary Physician Vernie Shanks, MD Primary Cardiologist: Lorretta Harp MD [name], [place]  HPI:  Joanne Carlson is a 75 y.o.  mother of 2 children referred by Dr. Amalia Hailey for peripheral vascular evaluation because of critical limb ischemia.  I last saw her in the office 10/10/2020.  She has a history of treated hypertension, diabetes and hyperlipidemia. 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 Dr. Adele Barthel placed an AV fistula. She's never had a heart attack or stroke. She denies chest pain or shortness of breath. She's had a small non-healing ulcer on her left great toe treated with antibiotics recently and according to her this is slowly healing. Lower extremity arterial Doppler studies performed in our office 01/03/17 revealed ABIs approximately 0.8 bilaterally with an occluded distal right SFA and high-grade left SFA stenosis with three-vessel runoff.   She had developed a nonhealing wound on her right great toe.  She is currently on hemodialysis.  I angiogram to her 01/30/2018 revealing a 99% calcified mid right SFA stenosis.  I performed chocolate balloon atherectomy followed by drug-eluting balloon angioplasty with excellent result.  Dopplers improved.  Her wound ultimately healed.   I performed peripheral angiography on her 03/03/2020 revealing minimal disease in the mid and distal right SFA in the 50% range, patent left external iliac artery stent with high-grade segmental calcified proximal mid left SFA stenosis.  She underwent shockwave intravascular lithotripsy and drug-coated balloon angioplasty with excellent result.  Follow-up Dopplers showed marked improvement as did her claudication.  She did complain of some pain in her right inguinal area which does not sound vascular.  Since I saw her 6 months ago she has developed some dry gangrene of the tip of her left great toe.  She  recently had ABIs performed 04/06/2021 that showed noncompressible left ABI.  Dr. Hardie Pulley, her podiatrist, is addressing this.  At the time of her last angiogram which I performed 03/03/2020 her left external iliac artery stent was widely patent.  I intervened on her left SFA with an excellent result.  She did have three-vessel runoff to the foot.   Current Meds  Medication Sig   allopurinol (ZYLOPRIM) 100 MG tablet Take 200 mg by mouth daily.   aspirin EC 81 MG tablet Take 81 mg by mouth daily.   atorvastatin (LIPITOR) 80 MG tablet TAKE 1 TABLET BY MOUTH ONCE DAILY FOR 6:00 PM   azithromycin (ZITHROMAX) 250 MG tablet azithromycin 250 mg tablet  TAKE 2 TABLETS BY MOUTH SINGLE DOSE --TAKE 2 TABS ON DAY 1, THEN 1 TAB DAILY FOR DAYS 2-5   Blood Glucose Monitoring Suppl (ACCU-CHEK AVIVA PLUS) w/Device KIT    calcium acetate (PHOSLO) 667 MG capsule Take 667 mg by mouth 3 (three) times daily with meals.    Cephalexin 250 MG tablet Take 250 mg by mouth 2 (two) times daily.   cinacalcet (SENSIPAR) 60 MG tablet Take 60 mg by mouth daily.    clopidogrel (PLAVIX) 75 MG tablet Take 1 tablet by mouth once daily   Doxercalciferol (HECTOROL IV) Doxercalciferol (Hectorol)   gabapentin (NEURONTIN) 100 MG capsule Take 1 capsule by mouth every morning and 1 capsule at bedtime. PATIENT NEEDS OFFICE VISIT FOR ADDITIONAL REFILLS (Patient taking differently: Take 200 mg by mouth at bedtime.)   glucose blood test strip Use to test blood sugar daily. Dx code: 20.02.   Lancets  MISC Use to test blood sugar daily. Dx code 250.02.   lidocaine-prilocaine (EMLA) cream Apply 1 application topically Every Tuesday,Thursday,and Saturday with dialysis.    Methoxy PEG-Epoetin Beta (MIRCERA IJ) Mircera   Methoxy PEG-Epoetin Beta (MIRCERA IJ) Mircera   midodrine (PROAMATINE) 10 MG tablet Take 10 mg by mouth Every Tuesday,Thursday,and Saturday with dialysis.    sevelamer carbonate (RENVELA) 800 MG tablet sevelamer carbonate 800  mg tablet   sucroferric oxyhydroxide (VELPHORO) 500 MG chewable tablet Velphoro 500 mg chewable tablet  CRUSH OR CHEW AND SWALLOW 1 TABLET 3 TIMES A DAY WITH MEALS     Allergies  Allergen Reactions   Tylenol [Acetaminophen] Rash    Social History   Socioeconomic History   Marital status: Widowed    Spouse name: Not on file   Number of children: Not on file   Years of education: Not on file   Highest education level: Not on file  Occupational History   Not on file  Tobacco Use   Smoking status: Former    Years: 10.00    Types: Cigarettes    Quit date: 1970    Years since quitting: 52.7   Smokeless tobacco: Never   Tobacco comments:    "never a heavy smoker; smoked off and on"  Vaping Use   Vaping Use: Never used  Substance and Sexual Activity   Alcohol use: Not Currently    Comment: "in my younger days"   Drug use: Not Currently    Types: Marijuana    Comment: "weed in my teens"   Sexual activity: Not Currently    Birth control/protection: Post-menopausal  Other Topics Concern   Not on file  Social History Narrative   Not on file   Social Determinants of Health   Financial Resource Strain: Not on file  Food Insecurity: Not on file  Transportation Needs: Not on file  Physical Activity: Not on file  Stress: Not on file  Social Connections: Not on file  Intimate Partner Violence: Not on file     Review of Systems: General: negative for chills, fever, night sweats or weight changes.  Cardiovascular: negative for chest pain, dyspnea on exertion, edema, orthopnea, palpitations, paroxysmal nocturnal dyspnea or shortness of breath Dermatological: negative for rash Respiratory: negative for cough or wheezing Urologic: negative for hematuria Abdominal: negative for nausea, vomiting, diarrhea, bright red blood per rectum, melena, or hematemesis Neurologic: negative for visual changes, syncope, or dizziness All other systems reviewed and are otherwise negative except  as noted above.    Blood pressure (!) 118/58, pulse 77, height _0  (1.626 m), weight 171 lb 6.4 oz (77.7 kg).  General appearance: alert and no distress Neck: no adenopathy, no JVD, supple, symmetrical, trachea midline, thyroid not enlarged, symmetric, no tenderness/mass/nodules, and bilateral carotid bruits versus transmitted murmur Lungs: clear to auscultation bilaterally Heart: 2/6 outflow tract murmur consistent with aortic stenosis. Extremities: extremities normal, atraumatic, no cyanosis or edema Pulses: Diminished pedal pulses Skin: Dry gangrene tip of left great toe Neurologic: Grossly normal  EKG sinus rhythm at 77 with low limb voltage.  I personally reviewed this EKG.  ASSESSMENT AND PLAN:   HYPERCHOLESTEROLEMIA History of hyperlipidemia on high-dose statin therapy with lipid profile performed 09/22/2020 revealing total cholesterol 112, LDL 64 and HDL 29.  HYPERTENSION, BENIGN History of essential hypertension a blood pressure measured today at 118/58.  She currently is not on antihypertensive medications.  Critical lower limb ischemia History of critical limb ischemia status post intervention on her right  SFA by myself 01/30/2018.  She had two-vessel runoff with a dominant anterior tibial.  She underwent staged left SFA intervention with shockwave angioplasty followed by [facility] 03/03/2020.  At that time her left extrailiac artery stent was widely patent and she had three-vessel runoff.  She does have what appears to be gangrene on the tip of her left great toe and had recent ABIs performed 04/06/2021 that showed noncompressible left ABI with a normal right ABI.  I am going to get lower extremity arterial Doppler studies to further evaluate.  She does see Dr. Hardie Pulley, podiatry, for foot care.  Aortic stenosis Ms. Accardi had recent 2D echo performed 11/17/2020 that showed normal LV systolic function with an aortic valve area 1.56 cm and a mean gradient of 11 mmHg.  We will  repeat a 2D echo in May of next year.     Lorretta Harp MD FACP,FACC,FAHA, [facility] 04/08/2021 10:45 AM

## 2021-04-08 NOTE — Assessment & Plan Note (Signed)
Joanne Carlson had recent 2D echo performed 11/17/2020 that showed normal LV systolic function with an aortic valve area 1.56 cm and a mean gradient of 11 mmHg.  We will repeat a 2D echo in May of next year.

## 2021-04-08 NOTE — Assessment & Plan Note (Signed)
History of critical limb ischemia status post intervention on her right SFA by myself 01/30/2018.  She had two-vessel runoff with a dominant anterior tibial.  She underwent staged left SFA intervention with shockwave angioplasty followed by [facility] 03/03/2020.  At that time her left extrailiac artery stent was widely patent and she had three-vessel runoff.  She does have what appears to be gangrene on the tip of her left great toe and had recent ABIs performed 04/06/2021 that showed noncompressible left ABI with a normal right ABI.  I am going to get lower extremity arterial Doppler studies to further evaluate.  She does see Dr. Hardie Pulley, podiatry, for foot care.

## 2021-04-08 NOTE — Patient Instructions (Signed)
Medication Instructions:  Your physician recommends that you continue on your current medications as directed. Please refer to the Current Medication list given to you today.  *If you need a refill on your cardiac medications before your next appointment, please call your pharmacy*   Testing/Procedures: Your physician has requested that you have a lower extremity arterial duplex. This test is an ultrasound of the arteries in the legs. It looks at arterial blood flow in the legs. Allow one hour for Lower Arterial scans. There are no restrictions or special instructions. This procedure is done at [facility].  Your physician has requested that you have an echocardiogram. Echocardiography is a painless test that uses sound waves to create images of your heart. It provides your doctor with information about the size and shape of your heart and how well your heart's chambers and valves are working. This procedure takes approximately one hour. There are no restrictions for this procedure. To be done in May 2023. This procedure is done at [address].   Follow-Up: At [facility], you and your health needs are our priority.  As part of our continuing mission to provide you with exceptional heart care, we have created designated Provider Care Teams.  These Care Teams include your primary Cardiologist (physician) and Advanced Practice Providers (APPs -  Physician Assistants and Nurse Practitioners) who all work together to provide you with the care you need, when you need it.  We recommend signing up for the patient portal called "MyChart".  Sign up information is provided on this After Visit Summary.  MyChart is used to connect with patients for Virtual Visits (Telemedicine).  Patients are able to view lab/test results, encounter notes, upcoming appointments, etc.  Non-urgent messages can be sent to your provider as well.   To learn more about what you can do with MyChart, go to  [email or web address].    Your next appointment:   3 month(s)  The format for your next appointment:   In Person  Provider:   [name], MD

## 2021-04-08 NOTE — Assessment & Plan Note (Signed)
History of hyperlipidemia on high-dose statin therapy with lipid profile performed 09/22/2020 revealing total cholesterol 112, LDL 64 and HDL 29.

## 2021-04-09 DIAGNOSIS — N2581 Secondary hyperparathyroidism of renal origin: Secondary | ICD-10-CM | POA: Diagnosis not present

## 2021-04-09 DIAGNOSIS — N186 End stage renal disease: Secondary | ICD-10-CM | POA: Diagnosis not present

## 2021-04-09 DIAGNOSIS — Z992 Dependence on renal dialysis: Secondary | ICD-10-CM | POA: Diagnosis not present

## 2021-04-10 NOTE — Progress Notes (Signed)
  Subjective:  Patient ID: Joanne Carlson, female    DOB: August 10, 1945,  MRN: [id number]  Chief Complaint  Patient presents with   Foot Problem    Bilateral discolored toes and dystrophic nails.    75 y.o. female presents with the above complaint. History confirmed with patient.  Reports with 1-1/70-month history of discoloring to the toes of both feet worse at the left great toe.  Complains of thickening of the nails today.  She has a known history of peripheral arterial disease and was seen by Dr. Alvester Chou in the past for this with most recent vascular studies being performed March 2020  Objective:  Physical Exam: normal sensory exam; excessive distal cooling bilaterally with nonpalpable pulses.  Feet are not cold to touch. Nails dystrophic,  Left Foot: Gangrene to the tip of the left great toe. Left Hallux Nail with Partial Lysis with Nailbed Wound. Right Foot: Excessive distal cooling general pallor to the digits however no gangrenous changes noted  Vascular studies 09/05/20 Right: 50-74% stenosis noted in the superficial femoral artery. 30-49%  stenosis noted in the superficial femoral artery and/or popliteal artery.  Areas of shadowing vessels, can not rule higher grade stenosis versus  occlusion within. Moderate atherosclerosis noted throughout extremity.   Left: 50-74% stenosis noted in the superficial femoral artery. 50-74%  stenosis noted in the popliteal artery. Areas of shadowing vessels, can  not rule higher grade stenosis versus occlusion within. Moderate  atherosclerosis noted throughout extremity.  Assessment:   1. Critical lower limb ischemia (HCC)   2. Gangrene of toe of left foot ([place])   3. Ulcer of great toe, left, limited to breakdown of skin [facility])      Plan:  Patient was evaluated and treated and all questions answered.  Critical limb ischemia, dry gangrene to the left hallux -Previous vascular studies reviewed. -At this point we will proceed with Betadine to dry  the area.  She is to this daily.  She was dispensed a bottle of Betadine. It does not appear to have wet gangrene and there are no signs of infection presently.  We will offload with a surgical shoe which was dispensed today.  Advised of possible partial noncoverage, ABN signed for component that may not be covered. -We discussed the risks associated with gangrene and she has a high risk of loss of the digit and hold her part of foot due to gangrene and limb ischemia. -Carefully debrided nails today without iatrogenic injury.  She does have a wound to the undersurface of the nail of the left great toe pre-existing without purulence warmth or erythema. -Order noninvasive vascular studies -We discussed her following up with Dr. Rolley Sims as she is previously seen him for evaluation.  She stated she would make an appointment.  Return in about 2 weeks (around 04/17/2021) for Wound Care, vascular study f/u.

## 2021-04-11 DIAGNOSIS — Z992 Dependence on renal dialysis: Secondary | ICD-10-CM | POA: Diagnosis not present

## 2021-04-11 DIAGNOSIS — N186 End stage renal disease: Secondary | ICD-10-CM | POA: Diagnosis not present

## 2021-04-11 DIAGNOSIS — N2581 Secondary hyperparathyroidism of renal origin: Secondary | ICD-10-CM | POA: Diagnosis not present

## 2021-04-14 DIAGNOSIS — N186 End stage renal disease: Secondary | ICD-10-CM | POA: Diagnosis not present

## 2021-04-14 DIAGNOSIS — N2581 Secondary hyperparathyroidism of renal origin: Secondary | ICD-10-CM | POA: Diagnosis not present

## 2021-04-14 DIAGNOSIS — Z992 Dependence on renal dialysis: Secondary | ICD-10-CM | POA: Diagnosis not present

## 2021-04-16 DIAGNOSIS — N2581 Secondary hyperparathyroidism of renal origin: Secondary | ICD-10-CM | POA: Diagnosis not present

## 2021-04-16 DIAGNOSIS — N186 End stage renal disease: Secondary | ICD-10-CM | POA: Diagnosis not present

## 2021-04-16 DIAGNOSIS — Z992 Dependence on renal dialysis: Secondary | ICD-10-CM | POA: Diagnosis not present

## 2021-04-17 ENCOUNTER — Ambulatory Visit (INDEPENDENT_AMBULATORY_CARE_PROVIDER_SITE_OTHER): Payer: Medicare HMO | Admitting: Podiatry

## 2021-04-17 ENCOUNTER — Other Ambulatory Visit: Payer: Self-pay

## 2021-04-17 DIAGNOSIS — I70229 Atherosclerosis of native arteries of extremities with rest pain, unspecified extremity: Secondary | ICD-10-CM | POA: Diagnosis not present

## 2021-04-17 DIAGNOSIS — L97521 Non-pressure chronic ulcer of other part of left foot limited to breakdown of skin: Secondary | ICD-10-CM | POA: Diagnosis not present

## 2021-04-17 DIAGNOSIS — I96 Gangrene, not elsewhere classified: Secondary | ICD-10-CM | POA: Diagnosis not present

## 2021-04-18 DIAGNOSIS — N2581 Secondary hyperparathyroidism of renal origin: Secondary | ICD-10-CM | POA: Diagnosis not present

## 2021-04-18 DIAGNOSIS — Z992 Dependence on renal dialysis: Secondary | ICD-10-CM | POA: Diagnosis not present

## 2021-04-18 DIAGNOSIS — N186 End stage renal disease: Secondary | ICD-10-CM | POA: Diagnosis not present

## 2021-04-20 ENCOUNTER — Ambulatory Visit (HOSPITAL_COMMUNITY)
Admission: RE | Admit: 2021-04-20 | Payer: Medicare HMO | Source: Ambulatory Visit | Attending: Cardiovascular Disease | Admitting: Cardiovascular Disease

## 2021-04-21 DIAGNOSIS — N2581 Secondary hyperparathyroidism of renal origin: Secondary | ICD-10-CM | POA: Diagnosis not present

## 2021-04-21 DIAGNOSIS — Z992 Dependence on renal dialysis: Secondary | ICD-10-CM | POA: Diagnosis not present

## 2021-04-21 DIAGNOSIS — N186 End stage renal disease: Secondary | ICD-10-CM | POA: Diagnosis not present

## 2021-04-23 DIAGNOSIS — N2581 Secondary hyperparathyroidism of renal origin: Secondary | ICD-10-CM | POA: Diagnosis not present

## 2021-04-23 DIAGNOSIS — Z992 Dependence on renal dialysis: Secondary | ICD-10-CM | POA: Diagnosis not present

## 2021-04-23 DIAGNOSIS — N186 End stage renal disease: Secondary | ICD-10-CM | POA: Diagnosis not present

## 2021-04-25 DIAGNOSIS — N186 End stage renal disease: Secondary | ICD-10-CM | POA: Diagnosis not present

## 2021-04-25 DIAGNOSIS — Z992 Dependence on renal dialysis: Secondary | ICD-10-CM | POA: Diagnosis not present

## 2021-04-25 DIAGNOSIS — N2581 Secondary hyperparathyroidism of renal origin: Secondary | ICD-10-CM | POA: Diagnosis not present

## 2021-04-27 NOTE — Progress Notes (Signed)
  Subjective:  Patient ID: Joanne Carlson, female    DOB: [birth year],  MRN: [id number]  Chief Complaint  Patient presents with   Wound Check    Denies fever/chills/nausea/vomiting. Pt states no new concerns.    75 y.o. female presents with the above complaint. History confirmed with patient.  Has had follow-up with Dr. Alvester Chou and is due for  Objective:  Physical Exam: normal sensory exam; excessive distal cooling bilaterally with nonpalpable pulses.  Feet are not cold to touch. Nails dystrophic,  Left Foot: Left great toe black discoloration but with some areas of healthy tissue underneath Right Foot: Excessive distal cooling general pallor to the digits however no gangrenous changes noted  Vascular studies 09/05/20 Right: 50-74% stenosis noted in the superficial femoral artery. 30-49%  stenosis noted in the superficial femoral artery and/or popliteal artery.  Areas of shadowing vessels, can not rule higher grade stenosis versus  occlusion within. Moderate atherosclerosis noted throughout extremity.   Left: 50-74% stenosis noted in the superficial femoral artery. 50-74%  stenosis noted in the popliteal artery. Areas of shadowing vessels, can  not rule higher grade stenosis versus occlusion within. Moderate  atherosclerosis noted throughout extremity.  Assessment:   1. Critical lower limb ischemia (HCC)   2. Gangrene of toe of left foot ([place])   3. Ulcer of great toe, left, limited to breakdown of skin [facility])    Plan:  Patient was evaluated and treated and all questions answered.  Critical limb ischemia, dry gangrene to the left hallux -Wounds actually do appear to be improving with some healthy tissue beneath the areas thought to be ischemic.  This could possibly be due to acute ischemic event that is resolving.  Advised to leave dry and OTA -I did discuss that I think it is important that she continue appointment for vascular follow-up to ensure no further wound healing issues  No  follow-ups on file.

## 2021-04-28 DIAGNOSIS — N2581 Secondary hyperparathyroidism of renal origin: Secondary | ICD-10-CM | POA: Diagnosis not present

## 2021-04-28 DIAGNOSIS — N186 End stage renal disease: Secondary | ICD-10-CM | POA: Diagnosis not present

## 2021-04-28 DIAGNOSIS — Z992 Dependence on renal dialysis: Secondary | ICD-10-CM | POA: Diagnosis not present

## 2021-04-30 ENCOUNTER — Ambulatory Visit (HOSPITAL_COMMUNITY)
Admission: RE | Admit: 2021-04-30 | Payer: Medicare HMO | Source: Ambulatory Visit | Attending: Cardiovascular Disease | Admitting: Cardiovascular Disease

## 2021-04-30 DIAGNOSIS — N186 End stage renal disease: Secondary | ICD-10-CM | POA: Diagnosis not present

## 2021-04-30 DIAGNOSIS — N2581 Secondary hyperparathyroidism of renal origin: Secondary | ICD-10-CM | POA: Diagnosis not present

## 2021-04-30 DIAGNOSIS — Z992 Dependence on renal dialysis: Secondary | ICD-10-CM | POA: Diagnosis not present

## 2021-05-02 DIAGNOSIS — Z992 Dependence on renal dialysis: Secondary | ICD-10-CM | POA: Diagnosis not present

## 2021-05-02 DIAGNOSIS — N2581 Secondary hyperparathyroidism of renal origin: Secondary | ICD-10-CM | POA: Diagnosis not present

## 2021-05-02 DIAGNOSIS — N186 End stage renal disease: Secondary | ICD-10-CM | POA: Diagnosis not present

## 2021-05-04 DIAGNOSIS — E1122 Type 2 diabetes mellitus with diabetic chronic kidney disease: Secondary | ICD-10-CM | POA: Diagnosis not present

## 2021-05-04 DIAGNOSIS — Z992 Dependence on renal dialysis: Secondary | ICD-10-CM | POA: Diagnosis not present

## 2021-05-04 DIAGNOSIS — N186 End stage renal disease: Secondary | ICD-10-CM | POA: Diagnosis not present

## 2021-05-05 ENCOUNTER — Other Ambulatory Visit: Payer: Self-pay

## 2021-05-05 ENCOUNTER — Ambulatory Visit (INDEPENDENT_AMBULATORY_CARE_PROVIDER_SITE_OTHER): Payer: Medicare HMO | Admitting: Podiatry

## 2021-05-05 DIAGNOSIS — Z992 Dependence on renal dialysis: Secondary | ICD-10-CM | POA: Diagnosis not present

## 2021-05-05 DIAGNOSIS — N2581 Secondary hyperparathyroidism of renal origin: Secondary | ICD-10-CM | POA: Diagnosis not present

## 2021-05-05 DIAGNOSIS — L97521 Non-pressure chronic ulcer of other part of left foot limited to breakdown of skin: Secondary | ICD-10-CM

## 2021-05-05 DIAGNOSIS — N186 End stage renal disease: Secondary | ICD-10-CM | POA: Diagnosis not present

## 2021-05-05 DIAGNOSIS — I70229 Atherosclerosis of native arteries of extremities with rest pain, unspecified extremity: Secondary | ICD-10-CM

## 2021-05-05 DIAGNOSIS — I96 Gangrene, not elsewhere classified: Secondary | ICD-10-CM

## 2021-05-05 NOTE — Progress Notes (Signed)
  Subjective:  Patient ID: Joanne Carlson, female    DOB: 26-Mar-1946,  MRN: [id number]  Chief Complaint  Patient presents with   Diabetic Ulcer    Left great toe ulcer. Pt state she has been doing well. No concerns or questions at this moment.    75 y.o. female presents with the above complaint. History confirmed with patient. States at present no further plans with Dr. Rolley Sims for vascular intervention  Objective:  Physical Exam: normal sensory exam; excessive distal cooling bilaterally with nonpalpable pulses.  Feet are not cold to touch. Nails dystrophic,  Left Foot: Left great toe black discoloration but with improving areas of  Right Foot: Excessive distal cooling general pallor to the digits however no gangrenous changes noted  Vascular studies 09/05/20 Right: 50-74% stenosis noted in the superficial femoral artery. 30-49%  stenosis noted in the superficial femoral artery and/or popliteal artery.  Areas of shadowing vessels, can not rule higher grade stenosis versus  occlusion within. Moderate atherosclerosis noted throughout extremity.   Left: 50-74% stenosis noted in the superficial femoral artery. 50-74%  stenosis noted in the popliteal artery. Areas of shadowing vessels, can  not rule higher grade stenosis versus occlusion within. Moderate  atherosclerosis noted throughout extremity.  Assessment:   1. Critical lower limb ischemia (HCC)   2. Gangrene of toe of left foot ([place])   3. Ulcer of great toe, left, limited to breakdown of skin [facility])    Plan:  Patient was evaluated and treated and all questions answered.  Critical limb ischemia, dry gangrene to the left hallux -Gangrenous component appears somewhat improved, with residual ulceration noted at the distal tip. I am optimistic that this will heal. -Advised should the color change or she develop warmth/eryhtema/SOI concerning for infection to call promptly for evaluation   Procedure: Selective Debridement of  Wound Rationale: Removal of devitalized tissue from the wound to promote healing.  Pre-Debridement Wound Measurements: 0.3 cm x 0.3 cm x 0.1 cm  Post-Debridement Wound Measurements: same as pre-debridement. Type of Debridement: sharp selective Instrumentation: dermal curette, tissue nipper Tissue Removed: Devitalized soft-tissue Dressing: Dry, sterile, compression dressing. Disposition: Patient tolerated procedure well.   Return in about 3 weeks (around 05/26/2021) for Wound Care.

## 2021-05-07 DIAGNOSIS — N2581 Secondary hyperparathyroidism of renal origin: Secondary | ICD-10-CM | POA: Diagnosis not present

## 2021-05-07 DIAGNOSIS — N186 End stage renal disease: Secondary | ICD-10-CM | POA: Diagnosis not present

## 2021-05-07 DIAGNOSIS — Z992 Dependence on renal dialysis: Secondary | ICD-10-CM | POA: Diagnosis not present

## 2021-05-09 DIAGNOSIS — Z992 Dependence on renal dialysis: Secondary | ICD-10-CM | POA: Diagnosis not present

## 2021-05-09 DIAGNOSIS — N186 End stage renal disease: Secondary | ICD-10-CM | POA: Diagnosis not present

## 2021-05-09 DIAGNOSIS — N2581 Secondary hyperparathyroidism of renal origin: Secondary | ICD-10-CM | POA: Diagnosis not present

## 2021-05-12 DIAGNOSIS — N186 End stage renal disease: Secondary | ICD-10-CM | POA: Diagnosis not present

## 2021-05-12 DIAGNOSIS — N2581 Secondary hyperparathyroidism of renal origin: Secondary | ICD-10-CM | POA: Diagnosis not present

## 2021-05-12 DIAGNOSIS — Z992 Dependence on renal dialysis: Secondary | ICD-10-CM | POA: Diagnosis not present

## 2021-05-14 DIAGNOSIS — N2581 Secondary hyperparathyroidism of renal origin: Secondary | ICD-10-CM | POA: Diagnosis not present

## 2021-05-14 DIAGNOSIS — Z992 Dependence on renal dialysis: Secondary | ICD-10-CM | POA: Diagnosis not present

## 2021-05-14 DIAGNOSIS — N186 End stage renal disease: Secondary | ICD-10-CM | POA: Diagnosis not present

## 2021-05-15 DIAGNOSIS — R310 Gross hematuria: Secondary | ICD-10-CM | POA: Diagnosis not present

## 2021-05-15 DIAGNOSIS — Z992 Dependence on renal dialysis: Secondary | ICD-10-CM | POA: Diagnosis not present

## 2021-05-15 DIAGNOSIS — Z905 Acquired absence of kidney: Secondary | ICD-10-CM | POA: Diagnosis not present

## 2021-05-15 DIAGNOSIS — Z6828 Body mass index (BMI) 28.0-28.9, adult: Secondary | ICD-10-CM | POA: Diagnosis not present

## 2021-05-16 DIAGNOSIS — Z992 Dependence on renal dialysis: Secondary | ICD-10-CM | POA: Diagnosis not present

## 2021-05-16 DIAGNOSIS — N186 End stage renal disease: Secondary | ICD-10-CM | POA: Diagnosis not present

## 2021-05-16 DIAGNOSIS — N2581 Secondary hyperparathyroidism of renal origin: Secondary | ICD-10-CM | POA: Diagnosis not present

## 2021-05-19 DIAGNOSIS — N186 End stage renal disease: Secondary | ICD-10-CM | POA: Diagnosis not present

## 2021-05-19 DIAGNOSIS — N2581 Secondary hyperparathyroidism of renal origin: Secondary | ICD-10-CM | POA: Diagnosis not present

## 2021-05-19 DIAGNOSIS — Z992 Dependence on renal dialysis: Secondary | ICD-10-CM | POA: Diagnosis not present

## 2021-05-21 DIAGNOSIS — N2581 Secondary hyperparathyroidism of renal origin: Secondary | ICD-10-CM | POA: Diagnosis not present

## 2021-05-21 DIAGNOSIS — N186 End stage renal disease: Secondary | ICD-10-CM | POA: Diagnosis not present

## 2021-05-21 DIAGNOSIS — Z992 Dependence on renal dialysis: Secondary | ICD-10-CM | POA: Diagnosis not present

## 2021-05-23 DIAGNOSIS — Z992 Dependence on renal dialysis: Secondary | ICD-10-CM | POA: Diagnosis not present

## 2021-05-23 DIAGNOSIS — N2581 Secondary hyperparathyroidism of renal origin: Secondary | ICD-10-CM | POA: Diagnosis not present

## 2021-05-23 DIAGNOSIS — N186 End stage renal disease: Secondary | ICD-10-CM | POA: Diagnosis not present

## 2021-05-26 ENCOUNTER — Other Ambulatory Visit: Payer: Self-pay | Admitting: Cardiovascular Disease

## 2021-05-26 ENCOUNTER — Other Ambulatory Visit: Payer: Self-pay

## 2021-05-26 ENCOUNTER — Ambulatory Visit (INDEPENDENT_AMBULATORY_CARE_PROVIDER_SITE_OTHER): Payer: Medicare HMO | Admitting: Podiatry

## 2021-05-26 DIAGNOSIS — N2581 Secondary hyperparathyroidism of renal origin: Secondary | ICD-10-CM | POA: Diagnosis not present

## 2021-05-26 DIAGNOSIS — Z992 Dependence on renal dialysis: Secondary | ICD-10-CM | POA: Diagnosis not present

## 2021-05-26 DIAGNOSIS — I96 Gangrene, not elsewhere classified: Secondary | ICD-10-CM | POA: Diagnosis not present

## 2021-05-26 DIAGNOSIS — L97521 Non-pressure chronic ulcer of other part of left foot limited to breakdown of skin: Secondary | ICD-10-CM | POA: Diagnosis not present

## 2021-05-26 DIAGNOSIS — I70229 Atherosclerosis of native arteries of extremities with rest pain, unspecified extremity: Secondary | ICD-10-CM | POA: Diagnosis not present

## 2021-05-26 DIAGNOSIS — N186 End stage renal disease: Secondary | ICD-10-CM | POA: Diagnosis not present

## 2021-05-26 NOTE — Progress Notes (Signed)
  Subjective:  Patient ID: Joanne Carlson, female    DOB: 1945-09-09,  MRN: [id number]  Chief Complaint  Patient presents with   Wound Check    Recheck left 2nd toe ulcer. Pt states no questions or concern. Pt states she is ready for her toe to full heal.    75 y.o. female presents with the above complaint. History confirmed with patient.    Objective:  Physical Exam: normal sensory exam; excessive distal cooling bilaterally with nonpalpable pulses.  Feet are not cold to touch. Nails dystrophic,  Left Foot: Left great toe black discoloration but with improving areas of gangrene with 0.3cm diameter granular wound with excssive HPK but no warmth/eryhema/SOI Right Foot: Excessive distal cooling general pallor to the digits however no gangrenous changes noted  Vascular studies 09/05/20 Right: 50-74% stenosis noted in the superficial femoral artery. 30-49%  stenosis noted in the superficial femoral artery and/or popliteal artery.  Areas of shadowing vessels, can not rule higher grade stenosis versus  occlusion within. Moderate atherosclerosis noted throughout extremity.   Left: 50-74% stenosis noted in the superficial femoral artery. 50-74%  stenosis noted in the popliteal artery. Areas of shadowing vessels, can  not rule higher grade stenosis versus occlusion within. Moderate  atherosclerosis noted throughout extremity.  Assessment:   1. Critical lower limb ischemia (HCC)   2. Gangrene of toe of left foot ([place])   3. Ulcer of great toe, left, limited to breakdown of skin [facility])    Plan:  Patient was evaluated and treated and all questions answered.  Critical limb ischemia, dry gangrene to the left hallux -Again ulcer appears slowly to be improving. Minimally debrided today, dressed with silvadene and band-aid. Continue daily. Advised to let us know if the wound worsens. Will recheck in 1 month.    Return in about 1 month (around 06/25/2021) for Wound Care.

## 2021-05-29 DIAGNOSIS — N186 End stage renal disease: Secondary | ICD-10-CM | POA: Diagnosis not present

## 2021-05-29 DIAGNOSIS — Z992 Dependence on renal dialysis: Secondary | ICD-10-CM | POA: Diagnosis not present

## 2021-05-29 DIAGNOSIS — N2581 Secondary hyperparathyroidism of renal origin: Secondary | ICD-10-CM | POA: Diagnosis not present

## 2021-05-31 DIAGNOSIS — Z992 Dependence on renal dialysis: Secondary | ICD-10-CM | POA: Diagnosis not present

## 2021-05-31 DIAGNOSIS — N2581 Secondary hyperparathyroidism of renal origin: Secondary | ICD-10-CM | POA: Diagnosis not present

## 2021-05-31 DIAGNOSIS — N186 End stage renal disease: Secondary | ICD-10-CM | POA: Diagnosis not present

## 2021-06-02 DIAGNOSIS — Z992 Dependence on renal dialysis: Secondary | ICD-10-CM | POA: Diagnosis not present

## 2021-06-02 DIAGNOSIS — N186 End stage renal disease: Secondary | ICD-10-CM | POA: Diagnosis not present

## 2021-06-02 DIAGNOSIS — N2581 Secondary hyperparathyroidism of renal origin: Secondary | ICD-10-CM | POA: Diagnosis not present

## 2021-06-03 DIAGNOSIS — Z992 Dependence on renal dialysis: Secondary | ICD-10-CM | POA: Diagnosis not present

## 2021-06-03 DIAGNOSIS — E1122 Type 2 diabetes mellitus with diabetic chronic kidney disease: Secondary | ICD-10-CM | POA: Diagnosis not present

## 2021-06-03 DIAGNOSIS — N186 End stage renal disease: Secondary | ICD-10-CM | POA: Diagnosis not present

## 2021-06-04 DIAGNOSIS — N2581 Secondary hyperparathyroidism of renal origin: Secondary | ICD-10-CM | POA: Diagnosis not present

## 2021-06-04 DIAGNOSIS — Z992 Dependence on renal dialysis: Secondary | ICD-10-CM | POA: Diagnosis not present

## 2021-06-04 DIAGNOSIS — N186 End stage renal disease: Secondary | ICD-10-CM | POA: Diagnosis not present

## 2021-06-05 DIAGNOSIS — N186 End stage renal disease: Secondary | ICD-10-CM | POA: Diagnosis not present

## 2021-06-05 DIAGNOSIS — R310 Gross hematuria: Secondary | ICD-10-CM | POA: Diagnosis not present

## 2021-06-05 DIAGNOSIS — Z7901 Long term (current) use of anticoagulants: Secondary | ICD-10-CM | POA: Diagnosis not present

## 2021-06-06 DIAGNOSIS — N186 End stage renal disease: Secondary | ICD-10-CM | POA: Diagnosis not present

## 2021-06-06 DIAGNOSIS — N2581 Secondary hyperparathyroidism of renal origin: Secondary | ICD-10-CM | POA: Diagnosis not present

## 2021-06-06 DIAGNOSIS — Z992 Dependence on renal dialysis: Secondary | ICD-10-CM | POA: Diagnosis not present

## 2021-06-09 DIAGNOSIS — N2581 Secondary hyperparathyroidism of renal origin: Secondary | ICD-10-CM | POA: Diagnosis not present

## 2021-06-09 DIAGNOSIS — Z992 Dependence on renal dialysis: Secondary | ICD-10-CM | POA: Diagnosis not present

## 2021-06-09 DIAGNOSIS — N186 End stage renal disease: Secondary | ICD-10-CM | POA: Diagnosis not present

## 2021-06-11 ENCOUNTER — Other Ambulatory Visit: Payer: Self-pay | Admitting: Cardiovascular Disease

## 2021-06-11 DIAGNOSIS — N186 End stage renal disease: Secondary | ICD-10-CM | POA: Diagnosis not present

## 2021-06-11 DIAGNOSIS — Z992 Dependence on renal dialysis: Secondary | ICD-10-CM | POA: Diagnosis not present

## 2021-06-11 DIAGNOSIS — N2581 Secondary hyperparathyroidism of renal origin: Secondary | ICD-10-CM | POA: Diagnosis not present

## 2021-06-13 DIAGNOSIS — N2581 Secondary hyperparathyroidism of renal origin: Secondary | ICD-10-CM | POA: Diagnosis not present

## 2021-06-13 DIAGNOSIS — N186 End stage renal disease: Secondary | ICD-10-CM | POA: Diagnosis not present

## 2021-06-13 DIAGNOSIS — Z992 Dependence on renal dialysis: Secondary | ICD-10-CM | POA: Diagnosis not present

## 2021-06-16 DIAGNOSIS — N186 End stage renal disease: Secondary | ICD-10-CM | POA: Diagnosis not present

## 2021-06-16 DIAGNOSIS — N2581 Secondary hyperparathyroidism of renal origin: Secondary | ICD-10-CM | POA: Diagnosis not present

## 2021-06-16 DIAGNOSIS — Z992 Dependence on renal dialysis: Secondary | ICD-10-CM | POA: Diagnosis not present

## 2021-06-18 DIAGNOSIS — Z992 Dependence on renal dialysis: Secondary | ICD-10-CM | POA: Diagnosis not present

## 2021-06-18 DIAGNOSIS — N2581 Secondary hyperparathyroidism of renal origin: Secondary | ICD-10-CM | POA: Diagnosis not present

## 2021-06-18 DIAGNOSIS — N186 End stage renal disease: Secondary | ICD-10-CM | POA: Diagnosis not present

## 2021-06-20 DIAGNOSIS — N2581 Secondary hyperparathyroidism of renal origin: Secondary | ICD-10-CM | POA: Diagnosis not present

## 2021-06-20 DIAGNOSIS — Z992 Dependence on renal dialysis: Secondary | ICD-10-CM | POA: Diagnosis not present

## 2021-06-20 DIAGNOSIS — N186 End stage renal disease: Secondary | ICD-10-CM | POA: Diagnosis not present

## 2021-06-23 DIAGNOSIS — N186 End stage renal disease: Secondary | ICD-10-CM | POA: Diagnosis not present

## 2021-06-23 DIAGNOSIS — N2581 Secondary hyperparathyroidism of renal origin: Secondary | ICD-10-CM | POA: Diagnosis not present

## 2021-06-23 DIAGNOSIS — Z992 Dependence on renal dialysis: Secondary | ICD-10-CM | POA: Diagnosis not present

## 2021-06-25 DIAGNOSIS — Z992 Dependence on renal dialysis: Secondary | ICD-10-CM | POA: Diagnosis not present

## 2021-06-25 DIAGNOSIS — N2581 Secondary hyperparathyroidism of renal origin: Secondary | ICD-10-CM | POA: Diagnosis not present

## 2021-06-25 DIAGNOSIS — N186 End stage renal disease: Secondary | ICD-10-CM | POA: Diagnosis not present

## 2021-06-26 ENCOUNTER — Ambulatory Visit (INDEPENDENT_AMBULATORY_CARE_PROVIDER_SITE_OTHER): Payer: Medicare HMO | Admitting: Podiatry

## 2021-06-26 DIAGNOSIS — L97521 Non-pressure chronic ulcer of other part of left foot limited to breakdown of skin: Secondary | ICD-10-CM

## 2021-06-26 DIAGNOSIS — I70229 Atherosclerosis of native arteries of extremities with rest pain, unspecified extremity: Secondary | ICD-10-CM

## 2021-06-27 DIAGNOSIS — N2581 Secondary hyperparathyroidism of renal origin: Secondary | ICD-10-CM | POA: Diagnosis not present

## 2021-06-27 DIAGNOSIS — N186 End stage renal disease: Secondary | ICD-10-CM | POA: Diagnosis not present

## 2021-06-27 DIAGNOSIS — Z992 Dependence on renal dialysis: Secondary | ICD-10-CM | POA: Diagnosis not present

## 2021-06-30 DIAGNOSIS — Z992 Dependence on renal dialysis: Secondary | ICD-10-CM | POA: Diagnosis not present

## 2021-06-30 DIAGNOSIS — N186 End stage renal disease: Secondary | ICD-10-CM | POA: Diagnosis not present

## 2021-06-30 DIAGNOSIS — N2581 Secondary hyperparathyroidism of renal origin: Secondary | ICD-10-CM | POA: Diagnosis not present

## 2021-07-01 DIAGNOSIS — N2889 Other specified disorders of kidney and ureter: Secondary | ICD-10-CM | POA: Diagnosis not present

## 2021-07-01 DIAGNOSIS — K429 Umbilical hernia without obstruction or gangrene: Secondary | ICD-10-CM | POA: Diagnosis not present

## 2021-07-01 DIAGNOSIS — N281 Cyst of kidney, acquired: Secondary | ICD-10-CM | POA: Diagnosis not present

## 2021-07-01 DIAGNOSIS — R310 Gross hematuria: Secondary | ICD-10-CM | POA: Diagnosis not present

## 2021-07-02 DIAGNOSIS — Z992 Dependence on renal dialysis: Secondary | ICD-10-CM | POA: Diagnosis not present

## 2021-07-02 DIAGNOSIS — N2581 Secondary hyperparathyroidism of renal origin: Secondary | ICD-10-CM | POA: Diagnosis not present

## 2021-07-02 DIAGNOSIS — N186 End stage renal disease: Secondary | ICD-10-CM | POA: Diagnosis not present

## 2021-07-03 DIAGNOSIS — R310 Gross hematuria: Secondary | ICD-10-CM | POA: Diagnosis not present

## 2021-07-03 DIAGNOSIS — D414 Neoplasm of uncertain behavior of bladder: Secondary | ICD-10-CM | POA: Diagnosis not present

## 2021-07-03 NOTE — Progress Notes (Signed)
°  Subjective:  Patient ID: Joanne Carlson, female    DOB: [birth year],  MRN: [id number]  No chief complaint on file.  75 y.o. female presents with the above complaint. History confirmed with patient. Patient thinks the wound is doing ok but not healed yet.  Objective:  Physical Exam: normal sensory exam; excessive distal cooling bilaterally with nonpalpable pulses.  Feet are not cold to touch. Nails dystrophic,  Left Foot: Left great toe black discoloration but with improving areas of gangrene with 0.2cm diameter granular wound with excssive HPK but no warmth/eryhema/SOI Right Foot: Excessive distal cooling general pallor to the digits however no gangrenous changes noted  Vascular studies 09/05/20 Right: 50-74% stenosis noted in the superficial femoral artery. 30-49%  stenosis noted in the superficial femoral artery and/or popliteal artery.  Areas of shadowing vessels, can not rule higher grade stenosis versus  occlusion within. Moderate atherosclerosis noted throughout extremity.   Left: 50-74% stenosis noted in the superficial femoral artery. 50-74%  stenosis noted in the popliteal artery. Areas of shadowing vessels, can  not rule higher grade stenosis versus occlusion within. Moderate  atherosclerosis noted throughout extremity.  Assessment:   1. Critical lower limb ischemia ([place])   2. Ulcer of great toe, left, limited to breakdown of skin [facility])    Plan:  Patient was evaluated and treated and all questions answered.  Critical limb ischemia, dry gangrene to the left hallux -Again continues to improve. Gently debrided wound. Should wound continue to fail to heal will refer back to vascular for possible intervention for non-healing wound.    No follow-ups on file.

## 2021-07-04 DIAGNOSIS — E1122 Type 2 diabetes mellitus with diabetic chronic kidney disease: Secondary | ICD-10-CM | POA: Diagnosis not present

## 2021-07-04 DIAGNOSIS — N186 End stage renal disease: Secondary | ICD-10-CM | POA: Diagnosis not present

## 2021-07-04 DIAGNOSIS — Z992 Dependence on renal dialysis: Secondary | ICD-10-CM | POA: Diagnosis not present

## 2021-07-04 DIAGNOSIS — N2581 Secondary hyperparathyroidism of renal origin: Secondary | ICD-10-CM | POA: Diagnosis not present

## 2021-07-07 DIAGNOSIS — N186 End stage renal disease: Secondary | ICD-10-CM | POA: Diagnosis not present

## 2021-07-07 DIAGNOSIS — N2581 Secondary hyperparathyroidism of renal origin: Secondary | ICD-10-CM | POA: Diagnosis not present

## 2021-07-07 DIAGNOSIS — Z992 Dependence on renal dialysis: Secondary | ICD-10-CM | POA: Diagnosis not present

## 2021-07-09 ENCOUNTER — Other Ambulatory Visit: Payer: Self-pay | Admitting: Urology

## 2021-07-09 ENCOUNTER — Telehealth: Payer: Self-pay | Admitting: Cardiovascular Disease

## 2021-07-09 DIAGNOSIS — Z992 Dependence on renal dialysis: Secondary | ICD-10-CM | POA: Diagnosis not present

## 2021-07-09 DIAGNOSIS — N186 End stage renal disease: Secondary | ICD-10-CM | POA: Diagnosis not present

## 2021-07-09 DIAGNOSIS — N2581 Secondary hyperparathyroidism of renal origin: Secondary | ICD-10-CM | POA: Diagnosis not present

## 2021-07-09 NOTE — Telephone Encounter (Addendum)
° °  Name: Joanne Carlson  DOB: 04-Jun-1946  MRN: [id number]  Primary Cardiologist: [name], MD  Chart reviewed as part of pre-operative protocol coverage. Because of Kathlean Cinco Longanecker's past medical history and time since last visit, she will require a follow-up visit in order to better assess preoperative cardiovascular risk. Primarily followed by Dr. Gwenlyn Found for significant PAD. Last PV angio/PTA was in 02/2020. Last 2D echo 11/2020 EF 60-65%, grade 1 DD, normal RV, mild aortic stenosis. Patient was last seen 04/2021 for critical limb ischemia with recommendation for 3 month f/u.She is already scheduled for this visit on 07/17/21 with Dr. Gwenlyn Found which is several days before surgery but unlikely to be adequate amount of time to hold Plavix before 07/22/21 OR date. Per podiatry note they continue to work with her for her gangrene. Although no known hx of CAD, has not had prior ischemic eval and PAD increases general risk, so would suggest patient be seen in office for clearing to undergo general anesthesia rather than phone clearance. Will route this message as FYI only to Dr. Gwenlyn Found so he is also aware to review at upcoming [place]. Dr. Gwenlyn Found, no reply needed in this specific message.  Pre-op covering staff: - Please contact requesting surgeon's office via preferred method (i.e, phone, fax) to inform them of need for appointment prior to surgery. Recommend to keep appt with Dr. Gwenlyn Found 07/17/21 and see if surgery could be pushed back by 1 day to allow for a 5 day plavix hold if deemed appropriate at that OV.  - I added pre-op clearance modifier to appt notes. Per office protocol, the provider should assess clearance at time of office visit and should forward their finalized clearance decision to requesting party below. I will remove this message from the pre-op box as separate preop APP input not needed at this time.   Charlie Pitter, PA-C  07/09/2021, 3:58 PM

## 2021-07-09 NOTE — Telephone Encounter (Signed)
I am going to fax these notes to requesting office to see notes from Melina Copa, [facility]. I will also forward these notes to MD for upcoming appt .

## 2021-07-09 NOTE — Telephone Encounter (Signed)
° °  Pre-operative Risk Assessment    Patient Name: Joanne Carlson  DOB: Jul 13, 1945 MRN: [id number]      Request for Surgical Clearance    Procedure:   Resection of bladder tumor  Date of Surgery:  Clearance 07/22/21                                 Surgeon:  Dr. Terrilee Files  Surgeon's Group or Practice Name:  [facility] Phone number:  [phone] Fax number:  [phone]   Type of Clearance Requested:   - Medical  - Pharmacy:  Hold Clopidogrel (Plavix) 5 days   Type of Anesthesia:  General    Additional requests/questions:   n/a  Signed, Kamira J Martinique   07/09/2021, 1:33 PM

## 2021-07-10 NOTE — Telephone Encounter (Signed)
I will forward this to our pre op pool for review with the pre op provider as well.   Denyse Amass, please see notes from Dr. Cain Sieve

## 2021-07-11 DIAGNOSIS — N186 End stage renal disease: Secondary | ICD-10-CM | POA: Diagnosis not present

## 2021-07-11 DIAGNOSIS — N2581 Secondary hyperparathyroidism of renal origin: Secondary | ICD-10-CM | POA: Diagnosis not present

## 2021-07-11 DIAGNOSIS — Z992 Dependence on renal dialysis: Secondary | ICD-10-CM | POA: Diagnosis not present

## 2021-07-14 DIAGNOSIS — Z992 Dependence on renal dialysis: Secondary | ICD-10-CM | POA: Diagnosis not present

## 2021-07-14 DIAGNOSIS — N186 End stage renal disease: Secondary | ICD-10-CM | POA: Diagnosis not present

## 2021-07-14 DIAGNOSIS — N2581 Secondary hyperparathyroidism of renal origin: Secondary | ICD-10-CM | POA: Diagnosis not present

## 2021-07-14 NOTE — Patient Instructions (Addendum)
DUE TO COVID-19 ONLY ONE VISITOR IS ALLOWED TO COME WITH YOU AND STAY IN THE WAITING ROOM ONLY DURING PRE OP AND PROCEDURE DAY OF SURGERY IF YOU ARE GOING HOME AFTER SURGERY. IF YOU ARE SPENDING THE NIGHT 2 PEOPLE MAY VISIT WITH YOU IN YOUR PRIVATE ROOM AFTER SURGERY UNTIL VISITING  HOURS ARE OVER AT 800 PM AND 1  VISITOR  MAY  SPEND THE NIGHT.   YOU NEED TO HAVE A COVID 19 TEST ON___1/16____ @__9 :00_____, THIS TEST MUST BE DONE BEFORE SURGERY,                 Joanne Carlson     Your procedure is scheduled on: 07/22/21   Report to [facility] Main  Entrance   Report to admitting at  6:15 AM     Call this number if you have problems the morning of surgery [phone]   Remember: Do not eat food or drink :After Midnight the night before your surgery,        BRUSH YOUR TEETH MORNING OF SURGERY AND RINSE YOUR MOUTH OUT, NO CHEWING GUM CANDY OR MINTS.     Take these medicines the morning of surgery with A SIP OF WATER: Allopurinol,Cinacalcet  Stop taking Plavix___________on _1/12_________as instructed by __Dr. Berry___________.  Stop taking ____________as directed by your Surgeon/Cardiologist.  Contact your Surgeon/Cardiologist for instructions on Anticoagulant Therapy prior to surgery.                                  You may not have any metal on your body including hair pins and              piercings  Do not wear jewelry, make-up, lotions, powders or perfumes, deodorant             Do not wear nail polish on your fingernails.  Do not shave  48 hours prior to surgery.                 Do not bring valuables to the hospital. [place].  Contacts, dentures or bridgework may not be worn into surgery.  Leave suitcase in the car. After surgery it may be brought to your room.     Patients discharged the day of surgery will not be allowed to drive home.   IF YOU ARE HAVING SURGERY AND GOING HOME THE SAME DAY, YOU MUST  HAVE AN ADULT TO DRIVE YOU HOME AND BE WITH YOU FOR 24 HOURS. YOU MAY GO HOME BY TAXI OR UBER OR ORTHERWISE, BUT AN ADULT MUST ACCOMPANY YOU HOME AND STAY WITH YOU FOR 24 HOURS.  Name and phone number of your driver:  Special Instructions: N/A              Please read over the following fact sheets you were given: _____________________________________________________________________             [facility] - Preparing for Surgery Before surgery, you can play an important role.  Because skin is not sterile, your skin needs to be as free of germs as possible.  You can reduce the number of germs on your skin by washing with CHG (chlorahexidine gluconate) soap before surgery.  CHG is an antiseptic cleaner which kills germs and bonds with the skin to continue killing germs even  after washing. Please DO NOT use if you have an allergy to CHG or antibacterial soaps.  If your skin becomes reddened/irritated stop using the CHG and inform your nurse when you arrive at Short Stay. Do not shave (including legs and underarms) for at least 48 hours prior to the first CHG shower.   Please follow these instructions carefully:  1.  Shower with CHG Soap the night before surgery and the  morning of Surgery.  2.  If you choose to wash your hair, wash your hair first as usual with your  normal  shampoo.  3.  After you shampoo, rinse your hair and body thoroughly to remove the  shampoo.                            4.  Use CHG as you would any other liquid soap.  You can apply chg directly  to the skin and wash                       Gently with a scrungie or clean washcloth.  5.  Apply the CHG Soap to your body ONLY FROM THE NECK DOWN.   Do not use on face/ open                           Wound or open sores. Avoid contact with eyes, ears mouth and genitals (private parts).                       Wash face,  Genitals (private parts) with your normal soap.             6.  Wash thoroughly, paying special attention to the  area where your surgery  will be performed.  7.  Thoroughly rinse your body with warm water from the neck down.  8.  DO NOT shower/wash with your normal soap after using and rinsing off  the CHG Soap.                9.  Pat yourself dry with a clean towel.            10.  Wear clean pajamas.            11.  Place clean sheets on your bed the night of your first shower and do not  sleep with pets. Day of Surgery : Do not apply any lotions/deodorants the morning of surgery.  Please wear clean clothes to the hospital/surgery center.  FAILURE TO FOLLOW THESE INSTRUCTIONS MAY RESULT IN THE CANCELLATION OF YOUR SURGERY PATIENT SIGNATURE_________________________________  NURSE SIGNATURE__________________________________  ________________________________________________________________________

## 2021-07-15 ENCOUNTER — Other Ambulatory Visit: Payer: Self-pay

## 2021-07-15 ENCOUNTER — Encounter (HOSPITAL_COMMUNITY): Payer: Self-pay

## 2021-07-15 ENCOUNTER — Encounter (HOSPITAL_COMMUNITY)
Admission: RE | Admit: 2021-07-15 | Discharge: 2021-07-15 | Disposition: A | Discharge status: Home / Self Care | Payer: Medicare HMO | Source: Ambulatory Visit | Attending: Urology | Admitting: Urology

## 2021-07-15 VITALS — Wt 163.0 lb

## 2021-07-15 DIAGNOSIS — Z794 Long term (current) use of insulin: Secondary | ICD-10-CM | POA: Diagnosis not present

## 2021-07-15 DIAGNOSIS — D494 Neoplasm of unspecified behavior of bladder: Secondary | ICD-10-CM | POA: Insufficient documentation

## 2021-07-15 DIAGNOSIS — Z87891 Personal history of nicotine dependence: Secondary | ICD-10-CM | POA: Diagnosis not present

## 2021-07-15 DIAGNOSIS — N186 End stage renal disease: Secondary | ICD-10-CM | POA: Insufficient documentation

## 2021-07-15 DIAGNOSIS — I739 Peripheral vascular disease, unspecified: Secondary | ICD-10-CM | POA: Diagnosis not present

## 2021-07-15 DIAGNOSIS — E1122 Type 2 diabetes mellitus with diabetic chronic kidney disease: Secondary | ICD-10-CM | POA: Diagnosis not present

## 2021-07-15 DIAGNOSIS — Z01818 Encounter for other preprocedural examination: Secondary | ICD-10-CM

## 2021-07-15 DIAGNOSIS — Z01812 Encounter for preprocedural laboratory examination: Secondary | ICD-10-CM | POA: Insufficient documentation

## 2021-07-15 DIAGNOSIS — I12 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 stage renal disease: Secondary | ICD-10-CM | POA: Diagnosis not present

## 2021-07-15 HISTORY — DX: Prediabetes: R73.03

## 2021-07-15 LAB — CBC
HCT: 33.2 % — ABNORMAL LOW (ref 36.0–46.0)
Hemoglobin: 10.2 g/dL — ABNORMAL LOW (ref 12.0–15.0)
MCH: 31.1 pg (ref 26.0–34.0)
MCHC: 30.7 g/dL (ref 30.0–36.0)
MCV: 101.2 fL — ABNORMAL HIGH (ref 80.0–100.0)
Platelets: 373 10*3/uL (ref 150–400)
RBC: 3.28 MIL/uL — ABNORMAL LOW (ref 3.87–5.11)
RDW: 15.9 % — ABNORMAL HIGH (ref 11.5–15.5)
WBC: 13.4 10*3/uL — ABNORMAL HIGH (ref 4.0–10.5)
nRBC: 0 % (ref 0.0–0.2)

## 2021-07-15 LAB — GLUCOSE, CAPILLARY: Glucose-Capillary: 81 mg/dL (ref 70–99)

## 2021-07-15 LAB — HEMOGLOBIN A1C
Hgb A1c MFr Bld: 5.7 % — ABNORMAL HIGH (ref 4.8–5.6)
Mean Plasma Glucose: 116.89 mg/dL

## 2021-07-15 NOTE — Progress Notes (Signed)
COVID test- 07/20/21 at 9:00 am  PCP - Dr. Merita Norton Cardiologist - Dr. Adora Fridge  Chest x-ray - no EKG - 04/08/21-epic Stress Test - no ECHO - 04/08/21-epic Cardiac Cath - no Pacemaker/ICD device last checked:NA  Sleep Study - no CPAP -   Fasting Blood Sugar - Pt is diet control Checks Blood Sugar __QD___ times a day  Blood Thinner Instructions:Plavix/ Dr. Gwenlyn Found Aspirin Instructions:Stop 5 days prior to DOS/ Dr. Gwenlyn Found Last Dose:07/16/21  Anesthesia review: yes  Patient denies shortness of breath, fever, cough and chest pain at PAT appointment Pt has dialysis T, Th, Sat. Her graft is in the upper Rt arm with a good thrill and brut. She works out at [employer] and is a diet controlled diabetic.  Patient verbalized understanding of instructions that were given to them at the PAT appointment. Patient was also instructed that they will need to review over the PAT instructions again at home before surgery. yes

## 2021-07-16 DIAGNOSIS — Z992 Dependence on renal dialysis: Secondary | ICD-10-CM | POA: Diagnosis not present

## 2021-07-16 DIAGNOSIS — N2581 Secondary hyperparathyroidism of renal origin: Secondary | ICD-10-CM | POA: Diagnosis not present

## 2021-07-16 DIAGNOSIS — N186 End stage renal disease: Secondary | ICD-10-CM | POA: Diagnosis not present

## 2021-07-16 NOTE — Progress Notes (Incomplete Revision)
Anesthesia Chart Review   Case: [id number] Date/Time: 07/22/21 0815   Procedure: TRANSURETHRAL RESECTION OF BLADDER TUMOR (TURBT)   Anesthesia type: General   Pre-op diagnosis: BLADDER TUMOR   Location: [place] [facility]   Surgeons: Vira Agar, MD       DISCUSSION:76 y.o. former smoker with h/o HTN, PAD, ESRD, mild aortic valve stenosis, bladder tumor scheduled for above procedure 07/22/2021 with Dr. Terrilee Files.   Pt has appointment with cardiology for preoperative evaluation 07/17/2021. VS: BP (!) (P) 118/55    Pulse (P) 80    Temp (P) 36.8 C (Oral)    Resp (P) 18    Ht (P) _0  (1.626 m)    Wt 73.9 kg    SpO2 (P) 100%    BMI (P) 27.98 kg/m   PROVIDERS: Vernie Shanks, MD   LABS: Labs reviewed: Acceptable for surgery. (all labs ordered are listed, but only abnormal results are displayed)  Labs Reviewed  CBC - Abnormal; Notable for the following components:      Result Value   WBC 13.4 (*)    RBC 3.28 (*)    Hemoglobin 10.2 (*)    HCT 33.2 (*)    MCV 101.2 (*)    RDW 15.9 (*)    All other components within normal limits  HEMOGLOBIN A1C - Abnormal; Notable for the following components:   Hgb A1c MFr Bld 5.7 (*)    All other components within normal limits  GLUCOSE, CAPILLARY     IMAGES:   EKG: 04/08/2021 Rate 77 bpm  NSR  CV: Echo 11/17/2020 1. Left ventricular ejection fraction, by estimation, is 60 to 65%. The  left ventricle has normal function. The left ventricle has no regional  wall motion abnormalities. Left ventricular diastolic parameters are  consistent with Grade I diastolic  dysfunction (impaired relaxation). The average left ventricular global  longitudinal strain is -22.9 %. The global longitudinal strain is normal.   2. Right ventricular systolic function is normal. The right ventricular  size is normal. There is normal pulmonary artery systolic pressure.   3. The mitral valve is normal in structure. Trivial mitral valve   regurgitation. No evidence of mitral stenosis.   4. The aortic valve is calcified. There is moderate calcification of the  aortic valve. There is moderate thickening of the aortic valve. Aortic  valve regurgitation is not visualized. Mild aortic valve stenosis. Aortic  valve area, by VTI measures 1.56  cm. Aortic valve mean gradient measures 11.0 mmHg. Aortic valve Vmax  measures 2.23 m/s.   5. The inferior vena cava is normal in size with greater than 50%  respiratory variability, suggesting right atrial pressure of 3 mmHg.  Past Medical History:  Diagnosis Date   Arthritis    "legs, knees; not bad" (01/30/2018)   Dizziness    occasionally   ESRD (end stage renal disease) ([place])    [name]; TTS" (01/30/2018)   History of colon polyps    History of gout    takes Allopurinol daily (01/30/2018)   Hyperlipidemia    takes Pravastatin daily   Hypertension    takes Monopril daily   Joint pain    PAD (peripheral artery disease) ([place])    Peripheral neuropathy    Peripheral vascular disease ([place])    Pre-diabetes    Refusal of blood transfusions as patient is Jehovah's Witness    NO BLOOD OR BLOOD PRODUCTS. Albumin okay.    Stroke [facility]) 1990s   "  mini stroke; left lower mouth a little bit twisted since" (01/30/2018)    Past Surgical History:  Procedure Laterality Date   ABDOMINAL AORTOGRAM W/LOWER EXTREMITY N/A 01/30/2018   Procedure: ABDOMINAL AORTOGRAM W/LOWER EXTREMITY;  Surgeon: Lorretta Harp, MD;  Location: [place] CV LAB;  Service: Cardiovascular;  Laterality: N/A;   ABDOMINAL AORTOGRAM W/LOWER EXTREMITY N/A 03/03/2020   Procedure: ABDOMINAL AORTOGRAM W/LOWER EXTREMITY;  Surgeon: Lorretta Harp, MD;  Location: [place] CV LAB;  Service: Cardiovascular;  Laterality: N/A;   APPENDECTOMY     BASCILIC VEIN TRANSPOSITION Right 10/03/2013   Procedure: BRACHIOCEPHALIC Arteriovenous Fistula ;  Surgeon: Mal Misty, MD;  Location: [place];  Service: Vascular;   Laterality: Right;   [place] Right 11/17/2016   Procedure: BASCILIC VEIN TRANSPOSITION- RIGHT SINGLE STAGE;  Surgeon: Conrad Avonmore, MD;  Location: [place];  Service: Vascular;  Laterality: Right;   COLONOSCOPY     CYST EXCISION  1980's   cyst removed from lower abdomen   DILATION AND CURETTAGE OF UTERUS  1987   ESOPHAGOGASTRODUODENOSCOPY     PERIPHERAL VASCULAR BALLOON ANGIOPLASTY Right 01/30/2018   Procedure: PERIPHERAL VASCULAR BALLOON ANGIOPLASTY;  Surgeon: Lorretta Harp, MD;  Location: [place] CV LAB;  Service: Cardiovascular;  Laterality: Right;  superficial femoral   PERIPHERAL VASCULAR INTERVENTION  03/03/2020   Procedure: PERIPHERAL VASCULAR INTERVENTION;  Surgeon: Lorretta Harp, MD;  Location: [place] CV LAB;  Service: Cardiovascular;;  external iliac stent left sfa lithotripsy/ drug coated balloon   SHOULDER ARTHROSCOPY W/ ROTATOR CUFF REPAIR Right 2013   TUBAL LIGATION  1986   UPPER EXTREMITY VENOGRAPHY Bilateral 11/08/2016   Procedure: Bilateral Upper Extremity Venography;  Surgeon: Conrad St. Leon, MD;  Location: [place] CV LAB;  Service: Cardiovascular;  Laterality: Bilateral;    MEDICATIONS:  allopurinol (ZYLOPRIM) 100 MG tablet   aspirin EC 81 MG tablet   atorvastatin (LIPITOR) 80 MG tablet   Blood Glucose Monitoring Suppl (ACCU-CHEK AVIVA PLUS) w/Device KIT   calcium acetate (PHOSLO) 667 MG capsule   cinacalcet (SENSIPAR) 60 MG tablet   clopidogrel (PLAVIX) 75 MG tablet   gabapentin (NEURONTIN) 100 MG capsule   glucose blood test strip   Lancets MISC   lidocaine-prilocaine (EMLA) cream   midodrine (PROAMATINE) 10 MG tablet   multivitamin (RENA-VIT) TABS tablet   No current facility-administered medications for this encounter.    Konrad Felix Ward, PA-C [facility] Pre-Surgical Testing [phone]

## 2021-07-16 NOTE — Telephone Encounter (Signed)
I tried to reach pt to go over instructions about Plavix.. I left a message to call the office back. I did also see that there is a DPR on file ok to s/w pt's daughter Joanne Carlson. I s/w [name] and went over the instructions that her mother will take Plavix today and will begin to hold Plavix as of tomorrow, will resume once surgeon feels safe from a bleeding standpoint. [name], thanked me for the call and she will let her mom know of these instructions. I will fax notes to requesting office as well.

## 2021-07-16 NOTE — Progress Notes (Addendum)
Anesthesia Chart Review   Case: [id number] Date/Time: 07/22/21 0815   Procedure: TRANSURETHRAL RESECTION OF BLADDER TUMOR (TURBT)   Anesthesia type: General   Pre-op diagnosis: BLADDER TUMOR   Location: [place] [facility]   Surgeons: Vira Agar, MD       DISCUSSION:75 y.o. former smoker with h/o HTN, PAD, ESRD (on dialysis T, Th, Sat), mild aortic valve stenosis, bladder tumor scheduled for above procedure 07/22/2021 with Dr. Terrilee Files.   Per cardiology preoperative evaluation 07/21/2021, "Chart reviewed as part of pre-operative protocol coverage. Given past medical history and time since last visit, based on ACC/AHA guidelines, Joanne Carlson would be at acceptable risk for the planned procedure without further cardiovascular testing.    Plavix may be held for 5-7 days prior to procedure.  Please resume as soon as hemostasis is achieved"  Anticipate pt can proceed with planned procedure barring acute status change.    VS: BP (!) (P) 118/55    Pulse (P) 80    Temp (P) 36.8 C (Oral)    Resp (P) 18    Ht (P) _0  (1.626 m)    Wt 73.9 kg    SpO2 (P) 100%    BMI (P) 27.98 kg/m   PROVIDERS: Vernie Shanks, MD  [name], MD is Cardiologist  LABS: Labs reviewed: Acceptable for surgery. (all labs ordered are listed, but only abnormal results are displayed)  Labs Reviewed  CBC - Abnormal; Notable for the following components:      Result Value   WBC 13.4 (*)    RBC 3.28 (*)    Hemoglobin 10.2 (*)    HCT 33.2 (*)    MCV 101.2 (*)    RDW 15.9 (*)    All other components within normal limits  HEMOGLOBIN A1C - Abnormal; Notable for the following components:   Hgb A1c MFr Bld 5.7 (*)    All other components within normal limits  GLUCOSE, CAPILLARY     IMAGES:   EKG: 04/08/2021 Rate 77 bpm  NSR  CV: Echo 11/17/2020 1. Left ventricular ejection fraction, by estimation, is 60 to 65%. The  left ventricle has normal function. The left ventricle has no  regional  wall motion abnormalities. Left ventricular diastolic parameters are  consistent with Grade I diastolic  dysfunction (impaired relaxation). The average left ventricular global  longitudinal strain is -22.9 %. The global longitudinal strain is normal.   2. Right ventricular systolic function is normal. The right ventricular  size is normal. There is normal pulmonary artery systolic pressure.   3. The mitral valve is normal in structure. Trivial mitral valve  regurgitation. No evidence of mitral stenosis.   4. The aortic valve is calcified. There is moderate calcification of the  aortic valve. There is moderate thickening of the aortic valve. Aortic  valve regurgitation is not visualized. Mild aortic valve stenosis. Aortic  valve area, by VTI measures 1.56  cm. Aortic valve mean gradient measures 11.0 mmHg. Aortic valve Vmax  measures 2.23 m/s.   5. The inferior vena cava is normal in size with greater than 50%  respiratory variability, suggesting right atrial pressure of 3 mmHg.  Past Medical History:  Diagnosis Date   Arthritis    "legs, knees; not bad" (01/30/2018)   Dizziness    occasionally   ESRD (end stage renal disease) ([place])    [name]; TTS" (01/30/2018)   History of colon polyps    History of gout  takes Allopurinol daily (01/30/2018)   Hyperlipidemia    takes Pravastatin daily   Hypertension    takes Monopril daily   Joint pain    PAD (peripheral artery disease) ([place])    Peripheral neuropathy    Peripheral vascular disease ([place])    Pre-diabetes    Refusal of blood transfusions as patient is Jehovah's Witness    NO BLOOD OR BLOOD PRODUCTS. Albumin okay.    Stroke [facility]) 1990s   "mini stroke; left lower mouth a little bit twisted since" (01/30/2018)    Past Surgical History:  Procedure Laterality Date   ABDOMINAL AORTOGRAM W/LOWER EXTREMITY N/A 01/30/2018   Procedure: ABDOMINAL AORTOGRAM W/LOWER EXTREMITY;  Surgeon: Lorretta Harp, MD;  Location: [place] [facility];  Service: Cardiovascular;  Laterality: N/A;   ABDOMINAL AORTOGRAM W/LOWER EXTREMITY N/A 03/03/2020   Procedure: ABDOMINAL AORTOGRAM W/LOWER EXTREMITY;  Surgeon: Lorretta Harp, MD;  Location: [place] [facility];  Service: Cardiovascular;  Laterality: N/A;   APPENDECTOMY     BASCILIC VEIN TRANSPOSITION Right 10/03/2013   Procedure: BRACHIOCEPHALIC Arteriovenous Fistula ;  Surgeon: Mal Misty, MD;  Location: [place];  Service: Vascular;  Laterality: Right;   [place] Right 11/17/2016   Procedure: BASCILIC VEIN TRANSPOSITION- RIGHT SINGLE STAGE;  Surgeon: Conrad Sister Bay, MD;  Location: [place];  Service: Vascular;  Laterality: Right;   COLONOSCOPY     CYST EXCISION  1980's   cyst removed from lower abdomen   DILATION AND CURETTAGE OF UTERUS  1987   ESOPHAGOGASTRODUODENOSCOPY     PERIPHERAL VASCULAR BALLOON ANGIOPLASTY Right 01/30/2018   Procedure: PERIPHERAL VASCULAR BALLOON ANGIOPLASTY;  Surgeon: Lorretta Harp, MD;  Location: [place] [facility];  Service: Cardiovascular;  Laterality: Right;  superficial femoral   PERIPHERAL VASCULAR INTERVENTION  03/03/2020   Procedure: PERIPHERAL VASCULAR INTERVENTION;  Surgeon: Lorretta Harp, MD;  Location: [place] [facility];  Service: Cardiovascular;;  external iliac stent left sfa lithotripsy/ drug coated balloon   SHOULDER ARTHROSCOPY W/ ROTATOR CUFF REPAIR Right 2013   TUBAL LIGATION  1986   UPPER EXTREMITY VENOGRAPHY Bilateral 11/08/2016   Procedure: Bilateral Upper Extremity Venography;  Surgeon: Conrad Barranquitas, MD;  Location: [place] [facility];  Service: Cardiovascular;  Laterality: Bilateral;    MEDICATIONS:  allopurinol (ZYLOPRIM) 100 MG tablet   aspirin EC 81 MG tablet   atorvastatin (LIPITOR) 80 MG tablet   Blood Glucose Monitoring Suppl (ACCU-CHEK AVIVA PLUS) w/Device KIT   calcium acetate (PHOSLO) 667 MG capsule   cinacalcet (SENSIPAR) 60 MG tablet   clopidogrel (PLAVIX) 75 MG tablet    gabapentin (NEURONTIN) 100 MG capsule   glucose blood test strip   Lancets MISC   lidocaine-prilocaine (EMLA) cream   midodrine (PROAMATINE) 10 MG tablet   multivitamin (RENA-VIT) TABS tablet   No current facility-administered medications for this encounter.    Konrad Felix Ward, PA-C [facility] [phone]

## 2021-07-16 NOTE — Telephone Encounter (Signed)
Preop appointment tomorrow with Dr. Gwenlyn Found.  Per Dr. Phillip Heal, we are going to advise the patient to hold her plavix starting the morning of the 13th and keep the operation on the 18th pending full clearance  I called and was unable to reach the patient. Callback pool, please remind the patient again to start holding plavix beginning tomorrow until after the surgery.

## 2021-07-17 ENCOUNTER — Encounter: Payer: Self-pay | Admitting: Cardiovascular Disease

## 2021-07-17 ENCOUNTER — Other Ambulatory Visit: Payer: Self-pay

## 2021-07-17 ENCOUNTER — Ambulatory Visit (INDEPENDENT_AMBULATORY_CARE_PROVIDER_SITE_OTHER): Payer: Medicare HMO | Admitting: Cardiovascular Disease

## 2021-07-17 VITALS — BP 130/60 | HR 81 | Ht 64.0 in | Wt 163.2 lb

## 2021-07-17 DIAGNOSIS — I10 Essential (primary) hypertension: Secondary | ICD-10-CM | POA: Diagnosis not present

## 2021-07-17 DIAGNOSIS — E7800 Pure hypercholesterolemia, unspecified: Secondary | ICD-10-CM | POA: Diagnosis not present

## 2021-07-17 DIAGNOSIS — I350 Nonrheumatic aortic (valve) stenosis: Secondary | ICD-10-CM | POA: Diagnosis not present

## 2021-07-17 DIAGNOSIS — I70229 Atherosclerosis of native arteries of extremities with rest pain, unspecified extremity: Secondary | ICD-10-CM | POA: Diagnosis not present

## 2021-07-17 DIAGNOSIS — R0989 Other specified symptoms and signs involving the circulatory and respiratory systems: Secondary | ICD-10-CM

## 2021-07-17 NOTE — Assessment & Plan Note (Signed)
History of essential hypertension a blood pressure measured today 130/60.  She is not on antihypertensive medications.

## 2021-07-17 NOTE — Assessment & Plan Note (Signed)
History of mild aortic stenosis with a 2D echo performed 11/17/2020 revealing an aortic valve area 1.56 cm.  She does have a soft outflow tract murmur.  We will repeat this in May of this year.

## 2021-07-17 NOTE — Assessment & Plan Note (Signed)
History of hyperlipidemia on high-dose statin therapy with lipid profile performed 09/22/2020 revealing total cholesterol 112, LDL 64 HDL 29.

## 2021-07-17 NOTE — Assessment & Plan Note (Signed)
History of critical limb ischemia status post angiography by myself 01/30/2018 revealing a 99% calcified mid right SFA stenosis.  I performed chocolate balloon atherectomy followed by drug-coated balloon angioplasty with excellent result.  Her Dopplers improved and her wounds ultimately healed.  I again performed peripheral angiography on her 03/03/2020 revealing minimal disease in the mid and distal RCA.  Her left extrailiac artery stent was patent and she had high-grade segmental calcified proximal and mid left SFA stenosis.  She underwent shockwave intravascular lithotripsy followed by drug-coated balloon angioplasty with excellent results.  Her Dopplers improved as did her claudication.  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 March Rummage.  She denies claudication.

## 2021-07-17 NOTE — Progress Notes (Signed)
07/17/2021 Joanne Carlson   02-03-1946  [id number]  Primary Physician Vernie Shanks, MD Primary Cardiologist: Lorretta Harp MD [name], [place]  HPI:  Joanne Carlson is a 76 y.o.   mother of 2 children referred by Dr. Amalia Hailey for peripheral vascular evaluation because of critical limb ischemia.  I last saw her in the office 04/08/2021.  She has a history of treated hypertension, diabetes and hyperlipidemia. 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 Dr. Adele Barthel placed an AV fistula. She's never had a heart attack or stroke. She denies chest pain or shortness of breath. She's had a small non-healing ulcer on her left great toe treated with antibiotics recently and according to her this is slowly healing. Lower extremity arterial Doppler studies performed in our office 01/03/17 revealed ABIs approximately 0.8 bilaterally with an occluded distal right SFA and high-grade left SFA stenosis with three-vessel runoff.   She had developed a nonhealing wound on her right great toe.  She is currently on hemodialysis.  I angiogram to her 01/30/2018 revealing a 99% calcified mid right SFA stenosis.  I performed chocolate balloon atherectomy followed by drug-eluting balloon angioplasty with excellent result.  Dopplers improved.  Her wound ultimately healed.   I performed peripheral angiography on her 03/03/2020 revealing minimal disease in the mid and distal right SFA in the 50% range, patent left external iliac artery stent with high-grade segmental calcified proximal mid left SFA stenosis.  She underwent shockwave intravascular lithotripsy and drug-coated balloon angioplasty with excellent result.  Follow-up Dopplers showed marked improvement as did her claudication.  She did complain of some pain in her right inguinal area which does not sound vascular.  Since I saw her 3 months ago she continues to do well.  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  Dr. March Rummage, her podiatrist, is still working on the dry gangrene at the tip of her left toe.  Her last Doppler studies reveal her left ABI to be noncompressible although her last angiogram performed 03/03/2020 revealed a patent left iliac stent.  I performed shockwave angioplasty followed by [facility] of her entire proximal and mid left SFA with excellent result.  She did have three-vessel runoff.  She is scheduled to have cystoscopy and potential urologic surgery on bladder tumors because of hematuria next week.  I believe it would be safe for her to come off her antiplatelet therapy prior to that.  She denies chest pain or shortness of breath.     Current Meds  Medication Sig   allopurinol (ZYLOPRIM) 100 MG tablet Take 100 mg by mouth daily.   aspirin EC 81 MG tablet Take 81 mg by mouth daily.   atorvastatin (LIPITOR) 80 MG tablet TAKE 1 TABLET BY MOUTH ONCE DAILY AT  6:00 PM   Blood Glucose Monitoring Suppl (ACCU-CHEK AVIVA PLUS) w/Device KIT    calcium acetate (PHOSLO) 667 MG capsule Take 667 mg by mouth 3 (three) times daily with meals.    cinacalcet (SENSIPAR) 60 MG tablet Take 60 mg by mouth daily.    clopidogrel (PLAVIX) 75 MG tablet Take 1 tablet by mouth once daily   gabapentin (NEURONTIN) 100 MG capsule Take 1 capsule by mouth every morning and 1 capsule at bedtime. PATIENT NEEDS OFFICE VISIT FOR ADDITIONAL REFILLS (Patient taking differently: Take 200 mg by mouth at bedtime as needed (pain).)   glucose blood test strip  Use to test blood sugar daily. Dx code: 58.02.   Lancets MISC Use to test blood sugar daily. Dx code 250.02.   lidocaine-prilocaine (EMLA) cream Apply 1 application topically Every Tuesday,Thursday,and Saturday with dialysis.    midodrine (PROAMATINE) 10 MG tablet Take 10 mg by mouth Every Tuesday,Thursday,and Saturday with dialysis.    multivitamin (RENA-VIT) TABS tablet Take 1 tablet by mouth daily.     Allergies  Allergen Reactions   Tylenol  [Acetaminophen] Rash    Social History   Socioeconomic History   Marital status: Widowed    Spouse name: Not on file   Number of children: Not on file   Years of education: Not on file   Highest education level: Not on file  Occupational History   Not on file  Tobacco Use   Smoking status: Former    Packs/day: 0.25    Years: 10.00    Pack years: 2.50    Types: Cigarettes    Quit date: 18    Years since quitting: 53.0   Smokeless tobacco: Never   Tobacco comments:    "never a heavy smoker; smoked off and on"  Vaping Use   Vaping Use: Never used  Substance and Sexual Activity   Alcohol use: Not Currently    Comment: "in my younger days"   Drug use: Not Currently    Types: Marijuana    Comment: "weed in my teens"   Sexual activity: Not Currently    Birth control/protection: Post-menopausal  Other Topics Concern   Not on file  Social History Narrative   Not on file   Social Determinants of Health   Financial Resource Strain: Not on file  Food Insecurity: Not on file  Transportation Needs: Not on file  Physical Activity: Not on file  Stress: Not on file  Social Connections: Not on file  Intimate Partner Violence: Not on file     Review of Systems: General: negative for chills, fever, night sweats or weight changes.  Cardiovascular: negative for chest pain, dyspnea on exertion, edema, orthopnea, palpitations, paroxysmal nocturnal dyspnea or shortness of breath Dermatological: negative for rash Respiratory: negative for cough or wheezing Urologic: negative for hematuria Abdominal: negative for nausea, vomiting, diarrhea, bright red blood per rectum, melena, or hematemesis Neurologic: negative for visual changes, syncope, or dizziness All other systems reviewed and are otherwise negative except as noted above.    Blood pressure 130/60, pulse 81, height 5' 4"  (1.626 m), weight 163 lb 3.2 oz (74 kg).  General appearance: alert and no distress Neck: no  adenopathy, no JVD, supple, symmetrical, trachea midline, thyroid not enlarged, symmetric, no tenderness/mass/nodules, and bilateral carotid bruits Lungs: clear to auscultation bilaterally Heart: Soft outflow tract murmur consistent with aortic stenosis. Extremities: extremities normal, atraumatic, no cyanosis or edema Pulses: 2+ and symmetric Skin: Skin color, texture, turgor normal. No rashes or lesions Neurologic: Grossly normal  EKG sinus rhythm 81 with low limb voltage.  I personally reviewed this EKG.  ASSESSMENT AND PLAN:   HYPERCHOLESTEROLEMIA History of hyperlipidemia on high-dose statin therapy with lipid profile performed 09/22/2020 revealing total cholesterol 112, LDL 64 HDL 29.  HYPERTENSION, BENIGN History of essential hypertension a blood pressure measured today 130/60.  She is not on antihypertensive medications.  Critical lower limb ischemia History of critical limb ischemia status post angiography by myself 01/30/2018 revealing a 99% calcified mid right SFA stenosis.  I performed chocolate balloon atherectomy followed by drug-coated balloon angioplasty with excellent result.  Her Dopplers improved and her  wounds ultimately healed.  I again performed peripheral angiography on her 03/03/2020 revealing minimal disease in the mid and distal RCA.  Her left extrailiac artery stent was patent and she had high-grade segmental calcified proximal and mid left SFA stenosis.  She underwent shockwave intravascular lithotripsy followed by drug-coated balloon angioplasty with excellent results.  Her Dopplers improved as did her claudication.  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 March Rummage.  She denies claudication.  Aortic stenosis History of mild aortic stenosis with a 2D echo performed 11/17/2020 revealing an aortic valve area 1.56 cm.  She does have a soft outflow tract murmur.  We will repeat this in May of this  year.     Lorretta Harp MD FACP,FACC,FAHA, [facility] 07/17/2021 10:07 AM

## 2021-07-17 NOTE — Patient Instructions (Signed)
Medication Instructions:  ?Your physician recommends that you continue on your current medications as directed. Please refer to the Current Medication list given to you today. ? ?*If you need a refill on your cardiac medications before your next appointment, please call your pharmacy* ? ? ?Follow-Up: ?At [facility], you and your health needs are our priority.  As part of our continuing mission to provide you with exceptional heart care, we have created designated Provider Care Teams.  These Care Teams include your primary Cardiologist (physician) and Advanced Practice Providers (APPs -  Physician Assistants and Nurse Practitioners) who all work together to provide you with the care you need, when you need it. ? ?We recommend signing up for the patient portal called "MyChart".  Sign up information is provided on this After Visit Summary.  MyChart is used to connect with patients for Virtual Visits (Telemedicine).  Patients are able to view lab/test results, encounter notes, upcoming appointments, etc.  Non-urgent messages can be sent to your provider as well.   ?To learn more about what you can do with MyChart, go to [email or web address].   ? ?Your next appointment:   ?6 month(s) ? ?The format for your next appointment:   ?In Person ? ?Provider:   ?Jesse Cleaver, FNP, Angela Duke, PA-C, Callie Goodrich, PA-C, Jennifer, Lambert, PA-C, Kathryn Lawrence, DNP, ANP, or Hao Meng, PA-C     ? ? ?Then, Jonathan Berry, MD will plan to see you again in 12 month(s).  ?

## 2021-07-18 DIAGNOSIS — Z992 Dependence on renal dialysis: Secondary | ICD-10-CM | POA: Diagnosis not present

## 2021-07-18 DIAGNOSIS — N186 End stage renal disease: Secondary | ICD-10-CM | POA: Diagnosis not present

## 2021-07-18 DIAGNOSIS — N2581 Secondary hyperparathyroidism of renal origin: Secondary | ICD-10-CM | POA: Diagnosis not present

## 2021-07-20 ENCOUNTER — Encounter (HOSPITAL_COMMUNITY)
Admission: RE | Admit: 2021-07-20 | Discharge: 2021-07-20 | Disposition: A | Discharge status: Home / Self Care | Payer: Medicare HMO | Source: Ambulatory Visit | Attending: Urology | Admitting: Urology

## 2021-07-20 ENCOUNTER — Other Ambulatory Visit: Payer: Self-pay

## 2021-07-20 DIAGNOSIS — Z87891 Personal history of nicotine dependence: Secondary | ICD-10-CM | POA: Diagnosis not present

## 2021-07-20 DIAGNOSIS — Z20822 Contact with and (suspected) exposure to covid-19: Secondary | ICD-10-CM | POA: Diagnosis present

## 2021-07-20 DIAGNOSIS — Z01812 Encounter for preprocedural laboratory examination: Secondary | ICD-10-CM | POA: Insufficient documentation

## 2021-07-20 DIAGNOSIS — R310 Gross hematuria: Secondary | ICD-10-CM | POA: Diagnosis not present

## 2021-07-20 DIAGNOSIS — R319 Hematuria, unspecified: Secondary | ICD-10-CM | POA: Diagnosis not present

## 2021-07-20 DIAGNOSIS — E1142 Type 2 diabetes mellitus with diabetic polyneuropathy: Secondary | ICD-10-CM | POA: Diagnosis present

## 2021-07-20 DIAGNOSIS — Z8673 Personal history of transient ischemic attack (TIA), and cerebral infarction without residual deficits: Secondary | ICD-10-CM | POA: Diagnosis not present

## 2021-07-20 DIAGNOSIS — Z8249 Family history of ischemic heart disease and other diseases of the circulatory system: Secondary | ICD-10-CM | POA: Diagnosis not present

## 2021-07-20 DIAGNOSIS — Z79899 Other long term (current) drug therapy: Secondary | ICD-10-CM | POA: Diagnosis not present

## 2021-07-20 DIAGNOSIS — N186 End stage renal disease: Secondary | ICD-10-CM | POA: Diagnosis present

## 2021-07-20 DIAGNOSIS — E1122 Type 2 diabetes mellitus with diabetic chronic kidney disease: Secondary | ICD-10-CM | POA: Diagnosis present

## 2021-07-20 DIAGNOSIS — M109 Gout, unspecified: Secondary | ICD-10-CM | POA: Diagnosis present

## 2021-07-20 DIAGNOSIS — C679 Malignant neoplasm of bladder, unspecified: Secondary | ICD-10-CM | POA: Diagnosis not present

## 2021-07-20 DIAGNOSIS — Z886 Allergy status to analgesic agent status: Secondary | ICD-10-CM | POA: Diagnosis not present

## 2021-07-20 DIAGNOSIS — Z7902 Long term (current) use of antithrombotics/antiplatelets: Secondary | ICD-10-CM | POA: Diagnosis not present

## 2021-07-20 DIAGNOSIS — Z7982 Long term (current) use of aspirin: Secondary | ICD-10-CM | POA: Diagnosis not present

## 2021-07-20 DIAGNOSIS — Z905 Acquired absence of kidney: Secondary | ICD-10-CM | POA: Diagnosis not present

## 2021-07-20 DIAGNOSIS — Z01818 Encounter for other preprocedural examination: Secondary | ICD-10-CM

## 2021-07-20 DIAGNOSIS — N2581 Secondary hyperparathyroidism of renal origin: Secondary | ICD-10-CM | POA: Diagnosis present

## 2021-07-20 DIAGNOSIS — E1151 Type 2 diabetes mellitus with diabetic peripheral angiopathy without gangrene: Secondary | ICD-10-CM | POA: Diagnosis present

## 2021-07-20 DIAGNOSIS — I12 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 stage renal disease: Secondary | ICD-10-CM | POA: Diagnosis present

## 2021-07-20 DIAGNOSIS — N3289 Other specified disorders of bladder: Secondary | ICD-10-CM | POA: Diagnosis not present

## 2021-07-20 DIAGNOSIS — Z992 Dependence on renal dialysis: Secondary | ICD-10-CM | POA: Diagnosis not present

## 2021-07-20 DIAGNOSIS — D494 Neoplasm of unspecified behavior of bladder: Secondary | ICD-10-CM | POA: Diagnosis present

## 2021-07-20 DIAGNOSIS — E7800 Pure hypercholesterolemia, unspecified: Secondary | ICD-10-CM | POA: Diagnosis present

## 2021-07-21 DIAGNOSIS — N186 End stage renal disease: Secondary | ICD-10-CM | POA: Diagnosis not present

## 2021-07-21 DIAGNOSIS — N2581 Secondary hyperparathyroidism of renal origin: Secondary | ICD-10-CM | POA: Diagnosis not present

## 2021-07-21 DIAGNOSIS — Z992 Dependence on renal dialysis: Secondary | ICD-10-CM | POA: Diagnosis not present

## 2021-07-21 LAB — SARS CORONAVIRUS 2 (TAT 6-24 HRS): SARS Coronavirus 2: NEGATIVE

## 2021-07-21 NOTE — Anesthesia Preprocedure Evaluation (Addendum)
Anesthesia Evaluation  Patient identified by MRN, date of birth, ID band Patient awake    Reviewed: Allergy & Precautions, NPO status , Patient's Chart, lab work & pertinent test results  History of Anesthesia Complications Negative for: history of anesthetic complications  Airway Mallampati: II  TM Distance: >3 FB Neck ROM: Full    Dental  (+) Dental Advisory Given, Partial Upper   Pulmonary former smoker,    Pulmonary exam normal        Cardiovascular hypertension (formerly on meds), + Peripheral Vascular Disease  Normal cardiovascular exam   '22 TTE - EF 60-65%, mild AS, trace MR    Neuro/Psych  Neuromuscular disease CVA, Residual Symptoms negative psych ROS   GI/Hepatic negative GI ROS, Neg liver ROS,   Endo/Other  diabetes (formerly on meds)  Renal/GU ESRF and DialysisRenal disease    Bladder tumor     Musculoskeletal  (+) Arthritis ,  Gout    Abdominal   Peds  Hematology  (+) anemia , REFUSES BLOOD PRODUCTS, JEHOVAH'S WITNESS  Anesthesia Other Findings   Reproductive/Obstetrics                           Anesthesia Physical Anesthesia Plan  ASA: 3  Anesthesia Plan: General   Post-op Pain Management:    Induction: Intravenous  PONV Risk Score and Plan: 3 and Treatment may vary due to age or medical condition, Ondansetron and Dexamethasone  Airway Management Planned: LMA  Additional Equipment: None  Intra-op Plan:   Post-operative Plan: Extubation in OR  Informed Consent: I have reviewed the patients History and Physical, chart, labs and discussed the procedure including the risks, benefits and alternatives for the proposed anesthesia with the patient or authorized representative who has indicated his/her understanding and acceptance.     Dental advisory given  Plan Discussed with: CRNA and Anesthesiologist  Anesthesia Plan Comments: (ETT if surgeon requests  muscle relaxation  )      Anesthesia Quick Evaluation

## 2021-07-21 NOTE — Telephone Encounter (Signed)
° °  Primary Cardiologist: [name], MD  Chart reviewed as part of pre-operative protocol coverage. Given past medical history and time since last visit, based on ACC/AHA guidelines, Joanne Carlson would be at acceptable risk for the planned procedure without further cardiovascular testing.   Plavix may be held for 5-7 days prior to procedure.  Please resume as soon as hemostasis is achieved.  I will route this recommendation to the requesting party via Epic fax function and remove from pre-op pool.  Please call with questions.  Jossie Ng. Mertis Mosher NP-C    07/21/2021, 9:31 AM [address] Office [phone] Fax [phone]

## 2021-07-22 ENCOUNTER — Encounter (HOSPITAL_COMMUNITY): Payer: Self-pay | Admitting: Urology

## 2021-07-22 ENCOUNTER — Ambulatory Visit (HOSPITAL_COMMUNITY): Payer: Medicare HMO | Admitting: Physician Assistant

## 2021-07-22 ENCOUNTER — Inpatient Hospital Stay (HOSPITAL_COMMUNITY)
Admission: AD | Admit: 2021-07-22 | Discharge: 2021-07-23 | DRG: 668 | Disposition: A | Discharge status: Home / Self Care | Payer: Medicare HMO | Source: Ambulatory Visit | Attending: Urology | Admitting: Urology

## 2021-07-22 ENCOUNTER — Other Ambulatory Visit: Payer: Self-pay

## 2021-07-22 ENCOUNTER — Ambulatory Visit (HOSPITAL_COMMUNITY): Payer: Medicare HMO | Admitting: Anesthesiology

## 2021-07-22 ENCOUNTER — Encounter (HOSPITAL_COMMUNITY)
Admission: AD | Disposition: A | Discharge status: Home / Self Care | Payer: Self-pay | Source: Ambulatory Visit | Attending: Urology

## 2021-07-22 DIAGNOSIS — R319 Hematuria, unspecified: Secondary | ICD-10-CM | POA: Diagnosis not present

## 2021-07-22 DIAGNOSIS — Z79899 Other long term (current) drug therapy: Secondary | ICD-10-CM | POA: Diagnosis not present

## 2021-07-22 DIAGNOSIS — E7800 Pure hypercholesterolemia, unspecified: Secondary | ICD-10-CM | POA: Diagnosis present

## 2021-07-22 DIAGNOSIS — M109 Gout, unspecified: Secondary | ICD-10-CM | POA: Diagnosis present

## 2021-07-22 DIAGNOSIS — N186 End stage renal disease: Secondary | ICD-10-CM | POA: Diagnosis not present

## 2021-07-22 DIAGNOSIS — R310 Gross hematuria: Secondary | ICD-10-CM | POA: Diagnosis not present

## 2021-07-22 DIAGNOSIS — Z886 Allergy status to analgesic agent status: Secondary | ICD-10-CM

## 2021-07-22 DIAGNOSIS — Z8673 Personal history of transient ischemic attack (TIA), and cerebral infarction without residual deficits: Secondary | ICD-10-CM | POA: Diagnosis not present

## 2021-07-22 DIAGNOSIS — I12 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 stage renal disease: Secondary | ICD-10-CM | POA: Diagnosis present

## 2021-07-22 DIAGNOSIS — Z7982 Long term (current) use of aspirin: Secondary | ICD-10-CM | POA: Diagnosis not present

## 2021-07-22 DIAGNOSIS — C679 Malignant neoplasm of bladder, unspecified: Secondary | ICD-10-CM | POA: Diagnosis present

## 2021-07-22 DIAGNOSIS — D494 Neoplasm of unspecified behavior of bladder: Secondary | ICD-10-CM | POA: Diagnosis not present

## 2021-07-22 DIAGNOSIS — Z87891 Personal history of nicotine dependence: Secondary | ICD-10-CM

## 2021-07-22 DIAGNOSIS — Z01818 Encounter for other preprocedural examination: Secondary | ICD-10-CM

## 2021-07-22 DIAGNOSIS — Z905 Acquired absence of kidney: Secondary | ICD-10-CM | POA: Diagnosis not present

## 2021-07-22 DIAGNOSIS — E1142 Type 2 diabetes mellitus with diabetic polyneuropathy: Secondary | ICD-10-CM | POA: Diagnosis present

## 2021-07-22 DIAGNOSIS — E1151 Type 2 diabetes mellitus with diabetic peripheral angiopathy without gangrene: Secondary | ICD-10-CM | POA: Diagnosis present

## 2021-07-22 DIAGNOSIS — E1122 Type 2 diabetes mellitus with diabetic chronic kidney disease: Secondary | ICD-10-CM | POA: Diagnosis present

## 2021-07-22 DIAGNOSIS — N2581 Secondary hyperparathyroidism of renal origin: Secondary | ICD-10-CM | POA: Diagnosis present

## 2021-07-22 DIAGNOSIS — Z8249 Family history of ischemic heart disease and other diseases of the circulatory system: Secondary | ICD-10-CM

## 2021-07-22 DIAGNOSIS — Z992 Dependence on renal dialysis: Secondary | ICD-10-CM | POA: Diagnosis not present

## 2021-07-22 DIAGNOSIS — C678 Malignant neoplasm of overlapping sites of bladder: Secondary | ICD-10-CM

## 2021-07-22 DIAGNOSIS — Z7902 Long term (current) use of antithrombotics/antiplatelets: Secondary | ICD-10-CM

## 2021-07-22 DIAGNOSIS — Z20822 Contact with and (suspected) exposure to covid-19: Secondary | ICD-10-CM | POA: Diagnosis present

## 2021-07-22 HISTORY — PX: TRANSURETHRAL RESECTION OF BLADDER TUMOR: SHX2575

## 2021-07-22 LAB — BASIC METABOLIC PANEL
Anion gap: 12 (ref 5–15)
BUN: 26 mg/dL — ABNORMAL HIGH (ref 8–23)
CO2: 29 mmol/L (ref 22–32)
Calcium: 7.9 mg/dL — ABNORMAL LOW (ref 8.9–10.3)
Chloride: 97 mmol/L — ABNORMAL LOW (ref 98–111)
Creatinine, Ser: 5.13 mg/dL — ABNORMAL HIGH (ref 0.44–1.00)
GFR, Estimated: 8 mL/min — ABNORMAL LOW (ref >60)
Glucose, Bld: 86 mg/dL (ref 70–99)
Potassium: 5 mmol/L (ref 3.5–5.1)
Sodium: 138 mmol/L (ref 135–145)

## 2021-07-22 LAB — MRSA NEXT GEN BY PCR, NASAL: MRSA by PCR Next Gen: NOT DETECTED

## 2021-07-22 LAB — GLUCOSE, CAPILLARY: Glucose-Capillary: 81 mg/dL (ref 70–99)

## 2021-07-22 SURGERY — TURBT (TRANSURETHRAL RESECTION OF BLADDER TUMOR)
Anesthesia: General | Site: Bladder

## 2021-07-22 MED ORDER — OXYCODONE HCL 5 MG PO TABS
ORAL_TABLET | ORAL | Status: AC
  Administered 2021-07-22: 5 mg via ORAL
  Filled 2021-07-22: qty 1

## 2021-07-22 MED ORDER — OXYCODONE HCL 5 MG PO TABS
ORAL_TABLET | ORAL | Status: AC
  Filled 2021-07-22: qty 1

## 2021-07-22 MED ORDER — OXYCODONE HCL 5 MG PO TABS: 5.0000 mg | ORAL_TABLET | Freq: Once | ORAL | Status: AC | PRN

## 2021-07-22 MED ORDER — PROPOFOL 10 MG/ML IV BOLUS
INTRAVENOUS | Status: AC
  Filled 2021-07-22: qty 20

## 2021-07-22 MED ORDER — BELLADONNA ALKALOIDS-OPIUM 16.2-60 MG RE SUPP: 1.0000 | Freq: Four times a day (QID) | RECTAL | Status: DC | PRN

## 2021-07-22 MED ORDER — PROPOFOL 10 MG/ML IV BOLUS
INTRAVENOUS | Status: DC | PRN
  Administered 2021-07-22: 150 mg via INTRAVENOUS
  Administered 2021-07-22: 20 mg via INTRAVENOUS

## 2021-07-22 MED ORDER — FENTANYL CITRATE (PF) 100 MCG/2ML IJ SOLN
INTRAMUSCULAR | Status: DC | PRN
Start: 2021-07-22 — End: 2021-07-22
  Administered 2021-07-22: 25 ug via INTRAVENOUS
  Administered 2021-07-22: 50 ug via INTRAVENOUS
  Administered 2021-07-22: 100 ug via INTRAVENOUS
  Administered 2021-07-22: 25 ug via INTRAVENOUS
  Administered 2021-07-22: 50 ug via INTRAVENOUS

## 2021-07-22 MED ORDER — FENTANYL CITRATE (PF) 100 MCG/2ML IJ SOLN
INTRAMUSCULAR | Status: AC
  Filled 2021-07-22: qty 2

## 2021-07-22 MED ORDER — ORAL CARE MOUTH RINSE: 15.0000 mL | Freq: Once | OROMUCOSAL | Status: AC

## 2021-07-22 MED ORDER — POLYETHYLENE GLYCOL 3350 17 G PO PACK: 17.0000 g | PACK | Freq: Every day | ORAL | Status: DC | PRN

## 2021-07-22 MED ORDER — OXYCODONE HCL 5 MG PO TABS: 5.0000 mg | ORAL_TABLET | ORAL | Status: DC | PRN

## 2021-07-22 MED ORDER — CEPHALEXIN 500 MG PO CAPS: 500.0000 mg | ORAL_CAPSULE | Freq: Two times a day (BID) | ORAL | 0 refills | Status: DC

## 2021-07-22 MED ORDER — ONDANSETRON HCL 4 MG/2ML IJ SOLN
INTRAMUSCULAR | Status: AC
  Filled 2021-07-22: qty 2

## 2021-07-22 MED ORDER — LACTATED RINGERS IV SOLN: INTRAVENOUS | Status: DC

## 2021-07-22 MED ORDER — CHLORHEXIDINE GLUCONATE CLOTH 2 % EX PADS
6.0000 | MEDICATED_PAD | Freq: Every day | CUTANEOUS | Status: DC
  Administered 2021-07-22 – 2021-07-23 (×2): 6 via TOPICAL

## 2021-07-22 MED ORDER — PHENAZOPYRIDINE HCL 200 MG PO TABS: 200.0000 mg | ORAL_TABLET | Freq: Three times a day (TID) | ORAL | 0 refills | Status: DC | PRN

## 2021-07-22 MED ORDER — OXYCODONE HCL 5 MG PO TABS
5.0000 mg | ORAL_TABLET | Freq: Once | ORAL | Status: AC
  Administered 2021-07-22: 5 mg via ORAL

## 2021-07-22 MED ORDER — SODIUM CHLORIDE 0.9 % IR SOLN
3000.0000 mL | Status: DC
  Administered 2021-07-22: 3000 mL

## 2021-07-22 MED ORDER — SODIUM CHLORIDE 0.9 % IR SOLN
Status: DC | PRN
  Administered 2021-07-22 (×6): 3000 mL

## 2021-07-22 MED ORDER — FENTANYL CITRATE PF 50 MCG/ML IJ SOSY
PREFILLED_SYRINGE | INTRAMUSCULAR | Status: AC
  Administered 2021-07-22: 50 ug via INTRAVENOUS
  Filled 2021-07-22: qty 2

## 2021-07-22 MED ORDER — CEPHALEXIN 500 MG PO CAPS
500.0000 mg | ORAL_CAPSULE | Freq: Two times a day (BID) | ORAL | Status: DC
  Administered 2021-07-22 – 2021-07-23 (×2): 500 mg via ORAL
  Filled 2021-07-22 (×2): qty 1

## 2021-07-22 MED ORDER — CALCIUM ACETATE (PHOS BINDER) 667 MG PO CAPS
667.0000 mg | ORAL_CAPSULE | Freq: Three times a day (TID) | ORAL | Status: DC
  Administered 2021-07-22 – 2021-07-23 (×3): 667 mg via ORAL
  Filled 2021-07-22 (×4): qty 1

## 2021-07-22 MED ORDER — ONDANSETRON HCL 4 MG/2ML IJ SOLN: 4.0000 mg | INTRAMUSCULAR | Status: DC | PRN

## 2021-07-22 MED ORDER — GABAPENTIN 100 MG PO CAPS: 200.0000 mg | ORAL_CAPSULE | Freq: Every evening | ORAL | Status: DC | PRN

## 2021-07-22 MED ORDER — CIPROFLOXACIN IN D5W 400 MG/200ML IV SOLN
400.0000 mg | INTRAVENOUS | Status: AC
  Administered 2021-07-22: 400 mg via INTRAVENOUS
  Filled 2021-07-22: qty 200

## 2021-07-22 MED ORDER — DEXAMETHASONE SODIUM PHOSPHATE 10 MG/ML IJ SOLN
INTRAMUSCULAR | Status: AC
  Filled 2021-07-22: qty 1

## 2021-07-22 MED ORDER — OXYCODONE HCL 5 MG/5ML PO SOLN: 5.0000 mg | Freq: Once | ORAL | Status: AC | PRN

## 2021-07-22 MED ORDER — DEXAMETHASONE SODIUM PHOSPHATE 10 MG/ML IJ SOLN
INTRAMUSCULAR | Status: DC | PRN
  Administered 2021-07-22: 10 mg via INTRAVENOUS

## 2021-07-22 MED ORDER — ONDANSETRON HCL 4 MG/2ML IJ SOLN
INTRAMUSCULAR | Status: DC | PRN
  Administered 2021-07-22: 4 mg via INTRAVENOUS

## 2021-07-22 MED ORDER — OXYBUTYNIN CHLORIDE 5 MG PO TABS: 5.0000 mg | ORAL_TABLET | Freq: Three times a day (TID) | ORAL | 1 refills | Status: DC | PRN

## 2021-07-22 MED ORDER — CHLORHEXIDINE GLUCONATE 0.12 % MT SOLN
15.0000 mL | Freq: Once | OROMUCOSAL | Status: AC
  Administered 2021-07-22: 15 mL via OROMUCOSAL

## 2021-07-22 MED ORDER — FENTANYL CITRATE PF 50 MCG/ML IJ SOSY
25.0000 ug | PREFILLED_SYRINGE | INTRAMUSCULAR | Status: DC | PRN
  Administered 2021-07-22: 25 ug via INTRAVENOUS

## 2021-07-22 MED ORDER — ATORVASTATIN CALCIUM 80 MG PO TABS
80.0000 mg | ORAL_TABLET | Freq: Every day | ORAL | Status: DC
Start: 2021-07-22 — End: 2021-07-23
  Administered 2021-07-22: 80 mg via ORAL
  Filled 2021-07-22 (×2): qty 1

## 2021-07-22 MED ORDER — TRAMADOL HCL 50 MG PO TABS: 50.0000 mg | ORAL_TABLET | Freq: Four times a day (QID) | ORAL | 0 refills | Status: AC | PRN

## 2021-07-22 MED ORDER — SODIUM CHLORIDE 0.9 % IV SOLN: INTRAVENOUS | Status: DC | PRN

## 2021-07-22 MED ORDER — SODIUM ZIRCONIUM CYCLOSILICATE 10 G PO PACK
10.0000 g | PACK | Freq: Once | ORAL | Status: AC
  Administered 2021-07-22: 10 g via ORAL
  Filled 2021-07-22: qty 1

## 2021-07-22 MED ORDER — LIDOCAINE 2% (20 MG/ML) 5 ML SYRINGE
INTRAMUSCULAR | Status: DC | PRN
  Administered 2021-07-22: 60 mg via INTRAVENOUS

## 2021-07-22 MED ORDER — CINACALCET HCL 30 MG PO TABS
60.0000 mg | ORAL_TABLET | Freq: Every day | ORAL | Status: DC
  Administered 2021-07-22: 60 mg via ORAL
  Filled 2021-07-22 (×2): qty 2

## 2021-07-22 MED ORDER — OXYBUTYNIN CHLORIDE 5 MG PO TABS: 5.0000 mg | ORAL_TABLET | Freq: Three times a day (TID) | ORAL | Status: DC | PRN

## 2021-07-22 MED ORDER — ONDANSETRON HCL 4 MG/2ML IJ SOLN: 4.0000 mg | Freq: Once | INTRAMUSCULAR | Status: DC | PRN

## 2021-07-22 SURGICAL SUPPLY — 21 items
BAG DRN RND TRDRP ANRFLXCHMBR (UROLOGICAL SUPPLIES) ×2
BAG URINE DRAIN 2000ML AR STRL (UROLOGICAL SUPPLIES) ×2 IMPLANT
BAG URO CATCHER STRL LF (MISCELLANEOUS) ×3 IMPLANT
CATH FOL 3WAY LX 20X30 (CATHETERS) ×1 IMPLANT
DRAPE FOOT SWITCH (DRAPES) ×3 IMPLANT
ELECT COAG BIPOLAR CYL 1.2MMM (ELECTROSURGICAL) ×2
ELECT REM PT RETURN 15FT ADLT (MISCELLANEOUS) ×3 IMPLANT
ELECTRODE COAG BIPLR CYL 1.2MM (ELECTROSURGICAL) IMPLANT
GLOVE SURG ENC TEXT LTX SZ7.5 (GLOVE) ×3 IMPLANT
GOWN STRL REUS W/TWL XL LVL3 (GOWN DISPOSABLE) ×3 IMPLANT
KIT TURNOVER KIT A (KITS) ×1 IMPLANT
LOOP CUT BIPOLAR 24F LRG (ELECTROSURGICAL) ×1 IMPLANT
MANIFOLD NEPTUNE II (INSTRUMENTS) ×3 IMPLANT
PACK CYSTO (CUSTOM PROCEDURE TRAY) ×3 IMPLANT
PENCIL SMOKE EVACUATOR (MISCELLANEOUS) IMPLANT
PLUG CATH AND CAP STER (CATHETERS) ×1 IMPLANT
SYR 30ML LL (SYRINGE) ×1 IMPLANT
SYR TOOMEY IRRIG 70ML (MISCELLANEOUS)
SYRINGE TOOMEY IRRIG 70ML (MISCELLANEOUS) IMPLANT
TUBING CONNECTING 10 (TUBING) ×3 IMPLANT
TUBING UROLOGY SET (TUBING) ×3 IMPLANT

## 2021-07-22 NOTE — Transfer of Care (Signed)
Immediate Anesthesia Transfer of Care Note  Patient: Joanne Carlson  Procedure(s) Performed: TRANSURETHRAL RESECTION OF BLADDER TUMOR (TURBT) (Bladder)  Patient Location: PACU  Anesthesia Type:General  Level of Consciousness: sedated  Airway & Oxygen Therapy: Patient Spontanous Breathing and Patient connected to face mask oxygen  Post-op Assessment: Report given to RN and Post -op Vital signs reviewed and stable  Post vital signs: Reviewed and stable  Last Vitals:  Vitals Value Taken Time  BP    Temp    Pulse    Resp    SpO2      Last Pain:  Vitals:   07/22/21 0708  TempSrc: Oral  PainSc: 0-No pain         Complications: No notable events documented.

## 2021-07-22 NOTE — Discharge Instructions (Addendum)
Transurethral Resection of Bladder Tumor (TURBT) or Bladder Biopsy   Definition:  Transurethral Resection of the Bladder Tumor is a surgical procedure used to diagnose and remove tumors within the bladder. TURBT is the most common treatment for early stage bladder cancer.  General instructions:     Your recent bladder surgery requires very little post hospital care but some definite precautions.  Despite the fact that no skin incisions were used, the area around the bladder incisions are raw and covered with scabs to promote healing and prevent bleeding. Certain precautions are needed to insure that the scabs are not disturbed over the next 2-4 weeks while the healing proceeds.  Because the raw surface inside your bladder and the irritating effects of urine you may expect frequency of urination and/or urgency (a stronger desire to urinate) and perhaps even getting up at night more often. This will usually resolve or improve slowly over the healing period. You may see some blood in your urine over the first 6 weeks. Do not be alarmed, even if the urine was clear for a while. Get off your feet and drink lots of fluids until clearing occurs. If you start to pass clots or don't improve call us.  Diet:  You may return to your normal diet immediately. Because of the raw surface of your bladder, alcohol, spicy foods, foods high in acid and drinks with caffeine may cause irritation or frequency and should be used in moderation. To keep your urine flowing freely and avoid constipation, drink plenty of fluids during the day (8-10 glasses). Tip: Avoid cranberry juice because it is very acidic.  Activity:  Your physical activity doesn't need to be restricted. However, if you are very active, you may see some blood in the urine. We suggest that you reduce your activity under the circumstances until the bleeding has stopped.  Bowels:  It is important to keep your bowels regular during the postoperative  period. Straining with bowel movements can cause bleeding. A bowel movement every other day is reasonable. Use a mild laxative if needed, such as milk of magnesia 2-3 tablespoons, or 2 Dulcolax tablets. Call if you continue to have problems. If you had been taking narcotics for pain, before, during or after your surgery, you may be constipated. Take a laxative if necessary.    Medication:  You should resume your pre-surgery medications unless told not to. In addition you may be given an antibiotic to prevent or treat infection. Antibiotics are not always necessary. All medication should be taken as prescribed until the bottles are finished unless you are having an unusual reaction to one of the drugs.     Foley Catheter Care A soft, flexible tube (Foley catheter) may have been placed in your bladder to drain urine and fluid. Follow these instructions: Taking Care of the Catheter  Keep the area where the catheter leaves your body clean.   Attach the catheter to the leg so there is no tension on the catheter.   Keep the drainage bag below the level of the bladder, but keep it OFF the floor.   Do not take long soaking baths. Your caregiver will give instructions about showering.   Wash your hands before touching ANYTHING related to the catheter or bag.   Using mild soap and warm water on a washcloth:   Clean the area closest to the catheter insertion site using a circular motion around the catheter.   Clean the catheter itself by wiping AWAY from the insertion   site for several inches down the tube.   NEVER wipe upward as this could sweep bacteria up into the urethra (tube in your body that normally drains the bladder) and cause infection.   Place a small amount of sterile lubricant at the tip of the penis where the catheter is entering.  Taking Care of the Drainage Bags  Two drainage bags may be taken home: a large overnight drainage bag, and a smaller leg bag which fits underneath  clothing.   It is okay to wear the overnight bag at any time, but NEVER wear the smaller leg bag at night.   Keep the drainage bag well below the level of your bladder. This prevents backflow of urine into the bladder and allows the urine to drain freely.   Anchor the tubing to your leg to prevent pulling or tension on the catheter. Use tape or a leg strap provided by the hospital.   Empty the drainage bag when it is 1/2 to 3/4 full. Wash your hands before and after touching the bag.   Periodically check the tubing for kinks to make sure there is no pressure on the tubing which could restrict the flow of urine.  Changing the Drainage Bags  Cleanse both ends of the clean bag with alcohol before changing.   Pinch off the rubber catheter to avoid urine spillage during the disconnection.   Disconnect the dirty bag and connect the clean one.   Empty the dirty bag carefully to avoid a urine spill.   Attach the new bag to the leg with tape or a leg strap.  Cleaning the Drainage Bags  Whenever a drainage bag is disconnected, it must be cleaned quickly so it is ready for the next use.   Wash the bag in warm, soapy water.   Rinse the bag thoroughly with warm water.   Soak the bag for 30 minutes in a solution of white vinegar and water (1 cup vinegar to 1 quart warm water).   Rinse with warm water.  SEEK MEDICAL CARE IF:   You have chills or night sweats.   You are leaking around your catheter or have problems with your catheter. It is not uncommon to have sporadic leakage around your catheter as a result of bladder spasms. If the leakage stops, there is not much need for concern. If you are uncertain, call your caregiver.   You develop side effects that you think are coming from your medicines.  SEEK IMMEDIATE MEDICAL CARE IF:   You are suddenly unable to urinate. Check to see if there are any kinks in the drainage tubing that may cause this. If you cannot find any kinks, call your  caregiver immediately. This is an emergency.   You develop shortness of breath or chest pains.   Bleeding persists or clots develop in your urine.   You have a fever.   You develop pain in your back or over your lower belly (abdomen).   You develop pain or swelling in your legs.   Any problems you are having get worse rather than better.  MAKE SURE YOU:   Understand these instructions.   Will watch your condition.   Will get help right away if you are not doing well or get worse.   

## 2021-07-22 NOTE — Anesthesia Procedure Notes (Signed)
Procedure Name: LMA Insertion Date/Time: 07/22/2021 9:13 AM Performed by: Lind Covert, CRNA Pre-anesthesia Checklist: Patient identified, Emergency Drugs available, Suction available and Patient being monitored Patient Re-evaluated:Patient Re-evaluated prior to induction Oxygen Delivery Method: Circle system utilized Preoxygenation: Pre-oxygenation with 100% oxygen Induction Type: IV induction LMA: LMA inserted LMA Size: 4.0 Tube type: Oral Number of attempts: 1 Placement Confirmation: positive ETCO2 and breath sounds checked- equal and bilateral Tube secured with: Tape Dental Injury: Teeth and Oropharynx as per pre-operative assessment

## 2021-07-22 NOTE — Anesthesia Postprocedure Evaluation (Signed)
Anesthesia Post Note  Patient: Joanne Carlson  Procedure(s) Performed: TRANSURETHRAL RESECTION OF BLADDER TUMOR (TURBT) (Bladder)     Patient location during evaluation: PACU Anesthesia Type: General Level of consciousness: awake and alert Pain management: pain level controlled Vital Signs Assessment: post-procedure vital signs reviewed and stable Respiratory status: spontaneous breathing, nonlabored ventilation and respiratory function stable Cardiovascular status: stable and blood pressure returned to baseline Anesthetic complications: no   No notable events documented.  Last Vitals:  Vitals:   07/22/21 1042 07/22/21 1115  BP: 132/66 123/64  Pulse: 81 74  Resp: 12 10  Temp:  36.6 C  SpO2: 100% 96%    Last Pain:  Vitals:   07/22/21 1120  TempSrc:   PainSc: [place]

## 2021-07-22 NOTE — H&P (Signed)
H&P  History of Present Illness: Joanne Carlson is a 76 y.o. year old who presents today for TURBT  No acute complaints or interval changes to history  Past Medical History:  Diagnosis Date   Arthritis    "legs, knees; not bad" (01/30/2018)   Dizziness    occasionally   ESRD (end stage renal disease) ([place])    [name]; TTS" (01/30/2018)   History of colon polyps    History of gout    takes Allopurinol daily (01/30/2018)   Hyperlipidemia    takes Pravastatin daily   Hypertension    takes Monopril daily   Joint pain    PAD (peripheral artery disease) ([place])    Peripheral neuropathy    Peripheral vascular disease ([place])    Pre-diabetes    Refusal of blood transfusions as patient is Jehovah's Witness    NO BLOOD OR BLOOD PRODUCTS. Albumin okay.    Stroke [facility]) 1990s   "mini stroke; left lower mouth a little bit twisted since" (01/30/2018)    Past Surgical History:  Procedure Laterality Date   ABDOMINAL AORTOGRAM W/LOWER EXTREMITY N/A 01/30/2018   Procedure: ABDOMINAL AORTOGRAM W/LOWER EXTREMITY;  Surgeon: Lorretta Harp, MD;  Location: [place] [facility];  Service: Cardiovascular;  Laterality: N/A;   ABDOMINAL AORTOGRAM W/LOWER EXTREMITY N/A 03/03/2020   Procedure: ABDOMINAL AORTOGRAM W/LOWER EXTREMITY;  Surgeon: Lorretta Harp, MD;  Location: [place] [facility];  Service: Cardiovascular;  Laterality: N/A;   APPENDECTOMY     BASCILIC VEIN TRANSPOSITION Right 10/03/2013   Procedure: BRACHIOCEPHALIC Arteriovenous Fistula ;  Surgeon: Mal Misty, MD;  Location: [facility];  Service: Vascular;  Laterality: Right;   [place] Right 11/17/2016   Procedure: BASCILIC VEIN TRANSPOSITION- RIGHT SINGLE STAGE;  Surgeon: Conrad Clearbrook, MD;  Location: [place];  Service: Vascular;  Laterality: Right;   COLONOSCOPY     CYST EXCISION  1980's   cyst removed from lower abdomen   DILATION AND CURETTAGE OF UTERUS  1987   ESOPHAGOGASTRODUODENOSCOPY     PERIPHERAL VASCULAR  BALLOON ANGIOPLASTY Right 01/30/2018   Procedure: PERIPHERAL VASCULAR BALLOON ANGIOPLASTY;  Surgeon: Lorretta Harp, MD;  Location: [place] [facility];  Service: Cardiovascular;  Laterality: Right;  superficial femoral   PERIPHERAL VASCULAR INTERVENTION  03/03/2020   Procedure: PERIPHERAL VASCULAR INTERVENTION;  Surgeon: Lorretta Harp, MD;  Location: [place] [facility];  Service: Cardiovascular;;  external iliac stent left sfa lithotripsy/ drug coated balloon   SHOULDER ARTHROSCOPY W/ ROTATOR CUFF REPAIR Right 2013   TUBAL LIGATION  1986   UPPER EXTREMITY VENOGRAPHY Bilateral 11/08/2016   Procedure: Bilateral Upper Extremity Venography;  Surgeon: Conrad Tiburon, MD;  Location: [facility];  Service: Cardiovascular;  Laterality: Bilateral;    Home Medications:  Current Meds  Medication Sig   allopurinol (ZYLOPRIM) 100 MG tablet Take 100 mg by mouth daily.   aspirin EC 81 MG tablet Take 81 mg by mouth daily.   atorvastatin (LIPITOR) 80 MG tablet TAKE 1 TABLET BY MOUTH ONCE DAILY AT  6:00 PM   calcium acetate (PHOSLO) 667 MG capsule Take 667 mg by mouth 3 (three) times daily with meals.    cinacalcet (SENSIPAR) 60 MG tablet Take 60 mg by mouth daily.    clopidogrel (PLAVIX) 75 MG tablet Take 1 tablet by mouth once daily   gabapentin (NEURONTIN) 100 MG capsule Take 1 capsule by mouth every morning and 1 capsule at bedtime. PATIENT NEEDS OFFICE VISIT FOR  ADDITIONAL REFILLS (Patient taking differently: Take 200 mg by mouth at bedtime as needed (pain).)   lidocaine-prilocaine (EMLA) cream Apply 1 application topically Every Tuesday,Thursday,and Saturday with dialysis.    midodrine (PROAMATINE) 10 MG tablet Take 10 mg by mouth Every Tuesday,Thursday,and Saturday with dialysis.    multivitamin (RENA-VIT) TABS tablet Take 1 tablet by mouth daily.    Allergies:  Allergies  Allergen Reactions   Tylenol [Acetaminophen] Rash    Family History  Problem Relation Age of Onset    Depression Mother    Hypertension Mother    Depression Father    Depression Sister    Hypertension Sister     Social History:  reports that she quit smoking about 53 years ago. Her smoking use included cigarettes. She has a 2.50 pack-year smoking history. She has never used smokeless tobacco. She reports that she does not currently use alcohol. She reports that she does not currently use drugs after having used the following drugs: Marijuana.  ROS: A complete review of systems was performed.  All systems are negative except for pertinent findings as noted.  Physical Exam:  Vital signs in last 24 hours: Temp:  [98.6 F (37 C)] 98.6 F (37 C) (01/18 0708) Pulse Rate:  [89] 89 (01/18 0708) Resp:  [20] 20 (01/18 0708) BP: (126)/(79) 126/79 (01/18 0708) SpO2:  [100 %] 100 % (01/18 0708) Weight:  [74 kg] 74 kg (01/18 0708) Constitutional:  Alert and oriented, No acute distress Cardiovascular: Regular rate and rhythm, No JVD Respiratory: Normal respiratory effort, Lungs clear bilaterally GI: Abdomen is soft, nontender, nondistended, no abdominal masses Lymphatic: No lymphadenopathy Neurologic: Grossly intact, no focal deficits Psychiatric: Normal mood and affect   Laboratory Data:  No results for input(s): WBC, HGB, HCT, PLT in the last 72 hours.  Recent Labs    07/22/21 0657  NA 138  K 5.0  CL 97*  GLUCOSE 86  BUN 26*  CALCIUM 7.9*  CREATININE 5.13*     Results for orders placed or performed during the hospital encounter of 07/22/21 (from the past 24 hour(s))  Basic metabolic panel     Status: Abnormal   Collection Time: 07/22/21  6:57 AM  Result Value Ref Range   Sodium 138 135 - 145 mmol/L   Potassium 5.0 3.5 - 5.1 mmol/L   Chloride 97 (L) 98 - 111 mmol/L   CO2 29 22 - 32 mmol/L   Glucose, Bld 86 70 - 99 mg/dL   BUN 26 (H) 8 - 23 mg/dL   Creatinine, Ser 5.13 (H) 0.44 - 1.00 mg/dL   Calcium 7.9 (L) 8.9 - 10.3 mg/dL   GFR, Estimated 8 (L) >60 mL/min   Anion gap  12 5 - 15  Glucose, capillary     Status: None   Collection Time: 07/22/21  7:06 AM  Result Value Ref Range   Glucose-Capillary 81 70 - 99 mg/dL   Recent Results (from the past 240 hour(s))  SARS CORONAVIRUS 2 (TAT 6-24 HRS) Nasopharyngeal Nasopharyngeal Swab     Status: None   Collection Time: 07/20/21  7:33 AM   Specimen: Nasopharyngeal Swab  Result Value Ref Range Status   SARS Coronavirus 2 NEGATIVE NEGATIVE Final    Comment: (NOTE) SARS-CoV-2 target nucleic acids are NOT DETECTED.  The SARS-CoV-2 RNA is generally detectable in upper and lower respiratory specimens during the acute phase of infection. Negative results do not preclude SARS-CoV-2 infection, do not rule out co-infections with other pathogens, and should not be  used as the sole basis for treatment or other patient management decisions. Negative results must be combined with clinical observations, patient history, and epidemiological information. The expected result is Negative.  Fact Sheet for Patients: SugarRoll.be  Fact Sheet for Healthcare Providers: https://www.woods-mathews.com/  This test is not yet approved or cleared by the [place] FDA and  has been authorized for detection and/or diagnosis of SARS-CoV-2 by FDA under an Emergency Use Authorization (EUA). This EUA will remain  in effect (meaning this test can be used) for the duration of the COVID-19 declaration under Se ction 564(b)(1) of the Act, 21 U.S.C. section 360bbb-3(b)(1), unless the authorization is terminated or revoked sooner.  Performed at [facility], [place] [address], [place], [place] [address]     Renal Function: Recent Labs    07/22/21 4403  CREATININE 5.13*   Estimated Creatinine Clearance: 9.2 mL/min (A) (by C-G formula based on SCr of 5.13 mg/dL (H)).  Radiologic Imaging: No results found.  Assessment:  76 yo F with a history of hematuria, imaging suspicious for tumors  in bladder  Plan:  To OR for cysto, transurethral resection of bladder tumor. We reviewed risks of the operation and all questions answered  Donald Pore, MD 07/22/2021, 7:45 AM  [facility] Pager: [phone]

## 2021-07-22 NOTE — Op Note (Signed)
Preoperative diagnosis: Bladder tumor  Hematuria History of R nephrectomy ESRD on HD  Postoperative diagnosis:  same  Procedure:  Cystoscopy Transurethral resection of bladder tumor ~2.5 cm  Surgeon: Donald Pore M.D.  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   EBL: Minimal  Specimens: Bladder tumor ~2.5 cm resected  Disposition of specimens: Pathology  Drain: 20 fr foley with 30 cc in balloon  Indication: Joanne Carlson is a patient who had a CT scan done for a gross hematuria workup suspicious for bladder tumors. After reviewing the management options for treatment, he elected to proceed with the above surgical procedure(s). We have discussed the potential benefits and risks of the procedure, side effects of the proposed treatment, the likelihood of the patient achieving the goals of the procedure, and any potential problems that might occur during the procedure or recuperation. Informed consent has been obtained.  Description of procedure:  The patient was taken to the operating room and general anesthesia was induced.  The patient was placed in the dorsal lithotomy position, prepped and draped in the usual sterile fashion, and preoperative antibiotics were administered. A preoperative time-out was performed.   Cystourethroscopy was performed.  The patients urethra was examined and was normal  The bladder was then systematically examined in its entirety. As mentioned above, essentially the entire bladder was covered in tumor. I was not able to identify the L UO (R kidney surgically absent).  The bladder was then re-examined after the resectoscope was placed.  The bipolar loop was used to resect around 2.5 cm of tumor located on the back wall of the bladder. This was evacuated out through the scope to send for pathology.   Extensive cauterization was  performed with the bipolar loop to achieve hemostasis, and the urine was draining light pink at the conclusion of the case.   A 20 fr 3 way catheter was placed and balloon filled with 30 cc.  The patient appeared to tolerate the procedure well and without complications.  The patient was able to be awakened and transferred to the recovery unit in satisfactory condition.    Donald Pore MD

## 2021-07-23 ENCOUNTER — Encounter (HOSPITAL_COMMUNITY): Payer: Self-pay | Admitting: Urology

## 2021-07-23 LAB — HEMOGLOBIN AND HEMATOCRIT, BLOOD
HCT: 29.2 % — ABNORMAL LOW (ref 36.0–46.0)
Hemoglobin: 9 g/dL — ABNORMAL LOW (ref 12.0–15.0)

## 2021-07-23 LAB — RENAL FUNCTION PANEL
Albumin: 2.8 g/dL — ABNORMAL LOW (ref 3.5–5.0)
Anion gap: 11 (ref 5–15)
BUN: 46 mg/dL — ABNORMAL HIGH (ref 8–23)
CO2: 29 mmol/L (ref 22–32)
Calcium: 7.6 mg/dL — ABNORMAL LOW (ref 8.9–10.3)
Chloride: 97 mmol/L — ABNORMAL LOW (ref 98–111)
Creatinine, Ser: 7.26 mg/dL — ABNORMAL HIGH (ref 0.44–1.00)
GFR, Estimated: 5 mL/min — ABNORMAL LOW (ref >60)
Glucose, Bld: 132 mg/dL — ABNORMAL HIGH (ref 70–99)
Phosphorus: 5.2 mg/dL — ABNORMAL HIGH (ref 2.5–4.6)
Potassium: 5.4 mmol/L — ABNORMAL HIGH (ref 3.5–5.1)
Sodium: 137 mmol/L (ref 135–145)

## 2021-07-23 LAB — SURGICAL PATHOLOGY

## 2021-07-23 MED ORDER — SODIUM ZIRCONIUM CYCLOSILICATE 10 G PO PACK
10.0000 g | PACK | Freq: Once | ORAL | Status: AC
  Administered 2021-07-23: 10 g via ORAL
  Filled 2021-07-23: qty 1

## 2021-07-23 NOTE — Discharge Summary (Signed)
Date of admission: 07/22/2021 ° °Date of discharge: 07/23/2021 ° °Admission diagnosis:  Bladder tumor [D49.4]  ° °Discharge diagnosis:  Bladder tumor [D49.4] ° °Secondary diagnoses:   °Active Ambulatory Problems  °  Diagnosis Date Noted  ° Type 2 diabetes mellitus without complication, with long-term current use of insulin (HCC) 03/15/2007  ° HYPERCHOLESTEROLEMIA 08/07/2008  ° ANEMIA 08/07/2008  ° HYPERTENSION, BENIGN 03/15/2007  ° ALLERGIC RHINITIS 03/04/2008  ° RENAL INSUFFICIENCY 08/07/2008  ° End stage renal disease (HCC) 07/20/2013  ° Mechanical complication of other vascular device, implant, and graft 07/20/2013  ° Chronic renal insufficiency, stage IV (severe) (HCC) 11/13/2013  ° Critical lower limb ischemia (HCC) 01/07/2017  ° Atherosclerosis of native arteries of the extremities with gangrene (HCC) 11/23/2017  ° Claudication in peripheral vascular disease (HCC) 03/03/2020  ° Allergy, unspecified, initial encounter 02/05/2019  ° Anaphylactic shock, unspecified, initial encounter 02/05/2019  ° Coagulation defect, unspecified (HCC) 10/19/2016  ° Dependence on renal dialysis (HCC) 11/16/2016  ° Dorsalgia, unspecified 10/12/2016  ° Encounter for immunization 11/22/2016  ° Encounter for removal of sutures 07/15/2018  ° Gout, unspecified 10/12/2016  ° History of recurrent transient ischemic attacks 09/27/2011  ° Hypercalcemia 04/25/2020  ° Hyperkalemia 10/12/2016  ° 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 stage renal disease (HCC) 10/12/2016  ° Iron deficiency anemia, unspecified 02/27/2018  ° Morbid (severe) obesity due to excess calories (HCC) 10/12/2016  ° Lumbar radiculopathy 10/15/2020  ° Other specified disorders of kidney and ureter 11/30/2011  ° Pain of left hand 12/24/2019  ° Secondary hyperparathyroidism of renal origin (HCC) 10/12/2016  ° Shortness of breath 12/04/2016  ° Unspecified protein-calorie malnutrition (HCC) 12/02/2016  ° Aortic stenosis 04/08/2021   ° °Resolved Ambulatory Problems  °  Diagnosis Date Noted  ° No Resolved Ambulatory Problems  ° °Past Medical History:  °Diagnosis Date  ° Arthritis   ° Dizziness   ° ESRD (end stage renal disease) (HCC)   ° History of colon polyps   ° History of gout   ° Hyperlipidemia   ° Hypertension   ° Joint pain   ° PAD (peripheral artery disease) (HCC)   ° Peripheral neuropathy   ° Peripheral vascular disease (HCC)   ° Pre-diabetes   ° Refusal of blood transfusions as patient is Jehovah's Witness   ° Stroke (HCC) 1990s  °  ° °History and Physical: For full details, please see admission history and physical. Briefly, Joanne Carlson is a 76 y.o. year old patient who was admitted with hematuria following a TURBT.  ° °Hospital Course: CBI was run over night and held POD 1. ° °On the day of discharge, the patient was tolerating a regular diet and their pain was well controlled. They were determined to be stable for discharge home ° °Laboratory values:  °Recent Labs  °  07/23/21 °0419  °HGB 9.0*  °HCT 29.2*  ° °Recent Labs  °  07/22/21 °0657 07/23/21 °0419  °CREATININE 5.13* 7.26*  ° ° °Disposition: Home ° °Discharge medications:  °Allergies as of 07/23/2021   ° °   Reactions  ° Tylenol [acetaminophen] Rash  ° °  ° °  °Medication List  °  ° °STOP taking these medications   ° °clopidogrel 75 MG tablet °Commonly known as: PLAVIX °  ° °  ° °TAKE these medications   ° °Accu-Chek Aviva Plus w/Device Kit °  °allopurinol 100 MG tablet °Commonly known as: ZYLOPRIM °Take 100 mg by mouth daily. °  °  aspirin EC 81 MG tablet °Take 81 mg by mouth daily. °  °atorvastatin 80 MG tablet °Commonly known as: LIPITOR °TAKE 1 TABLET BY MOUTH ONCE DAILY AT  6:00 PM °  °calcium acetate 667 MG capsule °Commonly known as: PHOSLO °Take 667 mg by mouth 3 (three) times daily with meals. °  °cephALEXin 500 MG capsule °Commonly known as: Keflex °Take 1 capsule (500 mg total) by mouth 2 (two) times daily. °  °cinacalcet 60 MG tablet °Commonly known as:  SENSIPAR °Take 60 mg by mouth daily. °  °gabapentin 100 MG capsule °Commonly known as: NEURONTIN °Take 1 capsule by mouth every morning and 1 capsule at bedtime. PATIENT NEEDS OFFICE VISIT FOR ADDITIONAL REFILLS °What changed:  °how much to take °how to take this °when to take this °reasons to take this °additional instructions °  °glucose blood test strip °Use to test blood sugar daily. Dx code: 250.02. °  °Lancets Misc °Use to test blood sugar daily. Dx code 250.02. °  °lidocaine-prilocaine cream °Commonly known as: EMLA °Apply 1 application topically Every Tuesday,Thursday,and Saturday with dialysis. °  °midodrine 10 MG tablet °Commonly known as: PROAMATINE °Take 10 mg by mouth Every Tuesday,Thursday,and Saturday with dialysis. °  °multivitamin Tabs tablet °Take 1 tablet by mouth daily. °  °oxybutynin 5 MG tablet °Commonly known as: DITROPAN °Take 1 tablet (5 mg total) by mouth 3 (three) times daily as needed for bladder spasms. °  °phenazopyridine 200 MG tablet °Commonly known as: Pyridium °Take 1 tablet (200 mg total) by mouth 3 (three) times daily as needed for pain. °  °traMADol 50 MG tablet °Commonly known as: Ultram °Take 1 tablet (50 mg total) by mouth every 6 (six) hours as needed for up to 5 days. °  ° °  ° ° °Followup:  ° Follow-up Information   ° ° ,  L, MD. Call in 1 week(s).   °Specialty: Urology °Why: please call to schedule catheter removal in 1 week °f/u with Dr  as schduled in 2 weeks °Contact information: °[address] °[place] [address] °[phone] ° ° °  °  ° °  °  ° °  ° ° °

## 2021-07-23 NOTE — Progress Notes (Signed)
1 Day Post-Op Subjective No acute events overnight.  Urine draining clear pink on low rate CBI.  Objective: Vital signs in last 24 hours: Temp:  [97.6 F (36.4 C)-98.6 F (37 C)] 97.8 F (36.6 C) (01/19 0437) Pulse Rate:  [66-89] 66 (01/19 0437) Resp:  [10-20] 18 (01/19 0437) BP: (101-136)/(54-79) 111/58 (01/19 0437) SpO2:  [91 %-100 %] 96 % (01/19 0437) Weight:  [74 kg-76.1 kg] 76.1 kg (01/18 2034)  Intake/Output from previous day: 01/18 0701 - 01/19 0700 In: 22060 [P.O.:360; I.V.:700] Out: 20340 [Urine:20340] Intake/Output this shift: Total I/O In: 21360 [P.O.:360; Other:21000] Out: 19525 [Urine:19525]  Physical Exam:  General: Alert and oriented CV: RRR Lungs: Clear Abdomen: Soft, ND, ATTP Ext: NT, No erythema   Lab Results: Recent Labs    07/23/21 0419  HGB 9.0*  HCT 29.2*   BMET Recent Labs    07/22/21 0657 07/23/21 0419  NA 138 137  K 5.0 5.4*  CL 97* 97*  CO2 29 29  GLUCOSE 86 132*  BUN 26* 46*  CREATININE 5.13* 7.26*  CALCIUM 7.9* 7.6*     Studies/Results: No results found.  Assessment/Plan: 76 yo s/p TURBT yesterday with hematuria.  -I stopped CBI and urine stayed light pink -d/c home today -per nephrology patient has arrangements for dialysis tomorrow.   LOS: 1 day   Donald Pore MD 07/23/2021, 6:56 AM [facility]  Pager: [phone]

## 2021-07-23 NOTE — Progress Notes (Signed)
Discharge order received, discussed with patient. She states that her daughter is at work and will not be able to assist her until after 6pm. She requires assistance getting into her house and carrying her belongings. Charge nurse notified. Coolidge Breeze, RN 07/23/2021

## 2021-07-23 NOTE — Plan of Care (Signed)
°  Problem: Skin Integrity: Goal: Demonstration of wound healing without infection will improve Outcome: Progressing   Problem: Education: Goal: Knowledge of General Education information will improve Description: Including pain rating scale, medication(s)/side effects and non-pharmacologic comfort measures Outcome: Progressing   Problem: Coping: Goal: Level of anxiety will decrease Outcome: Progressing   Problem: Elimination: Goal: Will not experience complications related to urinary retention Outcome: Progressing   Problem: Pain Managment: Goal: General experience of comfort will improve Outcome: Progressing   Problem: Safety: Goal: Ability to remain free from injury will improve Outcome: Progressing   Problem: Skin Integrity: Goal: Risk for impaired skin integrity will decrease Outcome: Progressing   Problem: Education: Goal: Knowledge of the prescribed therapeutic regimen will improve Outcome: Progressing   Problem: Skin Integrity: Goal: Demonstration of wound healing without infection will improve Outcome: Progressing

## 2021-07-23 NOTE — Progress Notes (Signed)
°  Transition of Care [facility]) Screening Note   Patient Details  Name: Joanne Carlson Date of Birth: 1946/05/23   Transition of Care [facility]) CM/SW Contact:    Dessa Phi, RN Phone Number: 07/23/2021, 10:42 AM    Transition of Care Department [facility]) has reviewed patient and no TOC needs have been identified at this time. We will continue to monitor patient advancement through interdisciplinary progression rounds. If new patient transition needs arise, please place a TOC consult.

## 2021-07-24 ENCOUNTER — Ambulatory Visit: Payer: Medicare HMO | Admitting: Podiatry

## 2021-07-24 DIAGNOSIS — N186 End stage renal disease: Secondary | ICD-10-CM | POA: Diagnosis not present

## 2021-07-24 DIAGNOSIS — Z992 Dependence on renal dialysis: Secondary | ICD-10-CM | POA: Diagnosis not present

## 2021-07-24 DIAGNOSIS — N2581 Secondary hyperparathyroidism of renal origin: Secondary | ICD-10-CM | POA: Diagnosis not present

## 2021-07-25 DIAGNOSIS — N2581 Secondary hyperparathyroidism of renal origin: Secondary | ICD-10-CM | POA: Diagnosis not present

## 2021-07-25 DIAGNOSIS — N186 End stage renal disease: Secondary | ICD-10-CM | POA: Diagnosis not present

## 2021-07-25 DIAGNOSIS — Z992 Dependence on renal dialysis: Secondary | ICD-10-CM | POA: Diagnosis not present

## 2021-07-28 DIAGNOSIS — N2581 Secondary hyperparathyroidism of renal origin: Secondary | ICD-10-CM | POA: Diagnosis not present

## 2021-07-28 DIAGNOSIS — Z992 Dependence on renal dialysis: Secondary | ICD-10-CM | POA: Diagnosis not present

## 2021-07-28 DIAGNOSIS — N186 End stage renal disease: Secondary | ICD-10-CM | POA: Diagnosis not present

## 2021-07-30 DIAGNOSIS — N2581 Secondary hyperparathyroidism of renal origin: Secondary | ICD-10-CM | POA: Diagnosis not present

## 2021-07-30 DIAGNOSIS — N186 End stage renal disease: Secondary | ICD-10-CM | POA: Diagnosis not present

## 2021-07-30 DIAGNOSIS — Z992 Dependence on renal dialysis: Secondary | ICD-10-CM | POA: Diagnosis not present

## 2021-08-01 DIAGNOSIS — N186 End stage renal disease: Secondary | ICD-10-CM | POA: Diagnosis not present

## 2021-08-01 DIAGNOSIS — Z992 Dependence on renal dialysis: Secondary | ICD-10-CM | POA: Diagnosis not present

## 2021-08-01 DIAGNOSIS — N2581 Secondary hyperparathyroidism of renal origin: Secondary | ICD-10-CM | POA: Diagnosis not present

## 2021-08-04 DIAGNOSIS — Z992 Dependence on renal dialysis: Secondary | ICD-10-CM | POA: Diagnosis not present

## 2021-08-04 DIAGNOSIS — E1122 Type 2 diabetes mellitus with diabetic chronic kidney disease: Secondary | ICD-10-CM | POA: Diagnosis not present

## 2021-08-04 DIAGNOSIS — N2581 Secondary hyperparathyroidism of renal origin: Secondary | ICD-10-CM | POA: Diagnosis not present

## 2021-08-04 DIAGNOSIS — N186 End stage renal disease: Secondary | ICD-10-CM | POA: Diagnosis not present

## 2021-08-06 DIAGNOSIS — N186 End stage renal disease: Secondary | ICD-10-CM | POA: Diagnosis not present

## 2021-08-06 DIAGNOSIS — Z992 Dependence on renal dialysis: Secondary | ICD-10-CM | POA: Diagnosis not present

## 2021-08-06 DIAGNOSIS — N2581 Secondary hyperparathyroidism of renal origin: Secondary | ICD-10-CM | POA: Diagnosis not present

## 2021-08-07 ENCOUNTER — Ambulatory Visit: Payer: Medicare HMO | Admitting: Podiatry

## 2021-08-07 ENCOUNTER — Ambulatory Visit (INDEPENDENT_AMBULATORY_CARE_PROVIDER_SITE_OTHER): Payer: Medicare HMO | Admitting: Podiatry

## 2021-08-07 ENCOUNTER — Other Ambulatory Visit: Payer: Self-pay

## 2021-08-07 DIAGNOSIS — L97521 Non-pressure chronic ulcer of other part of left foot limited to breakdown of skin: Secondary | ICD-10-CM

## 2021-08-07 DIAGNOSIS — I70229 Atherosclerosis of native arteries of extremities with rest pain, unspecified extremity: Secondary | ICD-10-CM | POA: Diagnosis not present

## 2021-08-07 DIAGNOSIS — C678 Malignant neoplasm of overlapping sites of bladder: Secondary | ICD-10-CM | POA: Diagnosis not present

## 2021-08-07 NOTE — Progress Notes (Signed)
°  Subjective:  Patient ID: Joanne Carlson, female    DOB: 11/18/1945,  MRN: [id number]  Chief Complaint  Patient presents with   Wound Check   76 y.o. female presents with the above complaint. History confirmed with patient. Patient thinks the wound is doing ok but not healed yet.  Objective:  Physical Exam: normal sensory exam; excessive distal cooling bilaterally with nonpalpable pulses.  Feet are not cold to touch. Nails dystrophic,  Left Foot: Left great toe black discoloration but with improving areas of gangrene with 0.2cm diameter granular wound with excssive HPK but no warmth/eryhema/SOI Right Foot: Excessive distal cooling general pallor to the digits however no gangrenous changes noted  Vascular studies 09/05/20 Right: 50-74% stenosis noted in the superficial femoral artery. 30-49%  stenosis noted in the superficial femoral artery and/or popliteal artery.  Areas of shadowing vessels, can not rule higher grade stenosis versus  occlusion within. Moderate atherosclerosis noted throughout extremity.   Left: 50-74% stenosis noted in the superficial femoral artery. 50-74%  stenosis noted in the popliteal artery. Areas of shadowing vessels, can  not rule higher grade stenosis versus occlusion within. Moderate  atherosclerosis noted throughout extremity.  Assessment:   No diagnosis found.  Plan:  Patient was evaluated and treated and all questions answered.  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  Given that he she has a history of chronic limb ischemia she is a high risk of developing gangrene.  I discussed this with the patient to keep it nice and dry with Betadine wet-to-dry dressing -She will do Betadine wet-to-dry dressing daily until resolve meant. -I discussed with her if there is any clinical signs of infection go to the emergency room right away she states understanding.  She will follow-up  with Dr. March Rummage in 3 weeks.  No follow-ups on file.

## 2021-08-08 DIAGNOSIS — Z992 Dependence on renal dialysis: Secondary | ICD-10-CM | POA: Diagnosis not present

## 2021-08-08 DIAGNOSIS — N2581 Secondary hyperparathyroidism of renal origin: Secondary | ICD-10-CM | POA: Diagnosis not present

## 2021-08-08 DIAGNOSIS — N186 End stage renal disease: Secondary | ICD-10-CM | POA: Diagnosis not present

## 2021-08-11 DIAGNOSIS — N2581 Secondary hyperparathyroidism of renal origin: Secondary | ICD-10-CM | POA: Diagnosis not present

## 2021-08-11 DIAGNOSIS — N186 End stage renal disease: Secondary | ICD-10-CM | POA: Diagnosis not present

## 2021-08-11 DIAGNOSIS — Z992 Dependence on renal dialysis: Secondary | ICD-10-CM | POA: Diagnosis not present

## 2021-08-13 DIAGNOSIS — Z992 Dependence on renal dialysis: Secondary | ICD-10-CM | POA: Diagnosis not present

## 2021-08-13 DIAGNOSIS — N186 End stage renal disease: Secondary | ICD-10-CM | POA: Diagnosis not present

## 2021-08-13 DIAGNOSIS — N2581 Secondary hyperparathyroidism of renal origin: Secondary | ICD-10-CM | POA: Diagnosis not present

## 2021-08-15 DIAGNOSIS — Z992 Dependence on renal dialysis: Secondary | ICD-10-CM | POA: Diagnosis not present

## 2021-08-15 DIAGNOSIS — N186 End stage renal disease: Secondary | ICD-10-CM | POA: Diagnosis not present

## 2021-08-15 DIAGNOSIS — N2581 Secondary hyperparathyroidism of renal origin: Secondary | ICD-10-CM | POA: Diagnosis not present

## 2021-08-16 DIAGNOSIS — Z0100 Encounter for examination of eyes and vision without abnormal findings: Secondary | ICD-10-CM | POA: Diagnosis not present

## 2021-08-16 DIAGNOSIS — H524 Presbyopia: Secondary | ICD-10-CM | POA: Diagnosis not present

## 2021-08-16 DIAGNOSIS — H401130 Primary open-angle glaucoma, bilateral, stage unspecified: Secondary | ICD-10-CM | POA: Diagnosis not present

## 2021-08-16 DIAGNOSIS — H2513 Age-related nuclear cataract, bilateral: Secondary | ICD-10-CM | POA: Diagnosis not present

## 2021-08-18 DIAGNOSIS — N2581 Secondary hyperparathyroidism of renal origin: Secondary | ICD-10-CM | POA: Diagnosis not present

## 2021-08-18 DIAGNOSIS — Z992 Dependence on renal dialysis: Secondary | ICD-10-CM | POA: Diagnosis not present

## 2021-08-18 DIAGNOSIS — N186 End stage renal disease: Secondary | ICD-10-CM | POA: Diagnosis not present

## 2021-08-20 DIAGNOSIS — N2581 Secondary hyperparathyroidism of renal origin: Secondary | ICD-10-CM | POA: Diagnosis not present

## 2021-08-20 DIAGNOSIS — Z992 Dependence on renal dialysis: Secondary | ICD-10-CM | POA: Diagnosis not present

## 2021-08-20 DIAGNOSIS — N186 End stage renal disease: Secondary | ICD-10-CM | POA: Diagnosis not present

## 2021-08-22 DIAGNOSIS — N186 End stage renal disease: Secondary | ICD-10-CM | POA: Diagnosis not present

## 2021-08-22 DIAGNOSIS — N2581 Secondary hyperparathyroidism of renal origin: Secondary | ICD-10-CM | POA: Diagnosis not present

## 2021-08-22 DIAGNOSIS — Z992 Dependence on renal dialysis: Secondary | ICD-10-CM | POA: Diagnosis not present

## 2021-08-25 DIAGNOSIS — N2581 Secondary hyperparathyroidism of renal origin: Secondary | ICD-10-CM | POA: Diagnosis not present

## 2021-08-25 DIAGNOSIS — Z992 Dependence on renal dialysis: Secondary | ICD-10-CM | POA: Diagnosis not present

## 2021-08-25 DIAGNOSIS — N186 End stage renal disease: Secondary | ICD-10-CM | POA: Diagnosis not present

## 2021-08-27 ENCOUNTER — Other Ambulatory Visit: Payer: Self-pay | Admitting: Cardiovascular Disease

## 2021-08-27 DIAGNOSIS — N186 End stage renal disease: Secondary | ICD-10-CM | POA: Diagnosis not present

## 2021-08-27 DIAGNOSIS — N2581 Secondary hyperparathyroidism of renal origin: Secondary | ICD-10-CM | POA: Diagnosis not present

## 2021-08-27 DIAGNOSIS — Z992 Dependence on renal dialysis: Secondary | ICD-10-CM | POA: Diagnosis not present

## 2021-08-28 ENCOUNTER — Ambulatory Visit: Payer: Medicare HMO | Admitting: Podiatry

## 2021-08-29 ENCOUNTER — Other Ambulatory Visit: Payer: Self-pay

## 2021-08-29 ENCOUNTER — Inpatient Hospital Stay (HOSPITAL_COMMUNITY)
Admission: EM | Admit: 2021-08-29 | Discharge: 2021-09-04 | DRG: 617 | Disposition: A | Discharge status: Home Health | Payer: Medicare HMO | Attending: Internal Medicine | Admitting: Internal Medicine

## 2021-08-29 ENCOUNTER — Emergency Department (HOSPITAL_COMMUNITY): Payer: Medicare HMO

## 2021-08-29 ENCOUNTER — Encounter (HOSPITAL_COMMUNITY): Payer: Self-pay | Admitting: Family Medicine

## 2021-08-29 DIAGNOSIS — I739 Peripheral vascular disease, unspecified: Secondary | ICD-10-CM | POA: Diagnosis not present

## 2021-08-29 DIAGNOSIS — E1142 Type 2 diabetes mellitus with diabetic polyneuropathy: Secondary | ICD-10-CM | POA: Diagnosis present

## 2021-08-29 DIAGNOSIS — M86172 Other acute osteomyelitis, left ankle and foot: Secondary | ICD-10-CM | POA: Diagnosis present

## 2021-08-29 DIAGNOSIS — Z9889 Other specified postprocedural states: Secondary | ICD-10-CM

## 2021-08-29 DIAGNOSIS — M109 Gout, unspecified: Secondary | ICD-10-CM | POA: Diagnosis present

## 2021-08-29 DIAGNOSIS — E1169 Type 2 diabetes mellitus with other specified complication: Secondary | ICD-10-CM | POA: Diagnosis not present

## 2021-08-29 DIAGNOSIS — Z20822 Contact with and (suspected) exposure to covid-19: Secondary | ICD-10-CM | POA: Diagnosis not present

## 2021-08-29 DIAGNOSIS — M869 Osteomyelitis, unspecified: Secondary | ICD-10-CM | POA: Diagnosis not present

## 2021-08-29 DIAGNOSIS — I70222 Atherosclerosis of native arteries of extremities with rest pain, left leg: Secondary | ICD-10-CM | POA: Diagnosis present

## 2021-08-29 DIAGNOSIS — L0390 Cellulitis, unspecified: Secondary | ICD-10-CM

## 2021-08-29 DIAGNOSIS — Z8551 Personal history of malignant neoplasm of bladder: Secondary | ICD-10-CM

## 2021-08-29 DIAGNOSIS — I998 Other disorder of circulatory system: Secondary | ICD-10-CM | POA: Diagnosis not present

## 2021-08-29 DIAGNOSIS — D494 Neoplasm of unspecified behavior of bladder: Secondary | ICD-10-CM | POA: Diagnosis not present

## 2021-08-29 DIAGNOSIS — D631 Anemia in chronic kidney disease: Secondary | ICD-10-CM | POA: Diagnosis not present

## 2021-08-29 DIAGNOSIS — L03116 Cellulitis of left lower limb: Secondary | ICD-10-CM | POA: Diagnosis present

## 2021-08-29 DIAGNOSIS — I96 Gangrene, not elsewhere classified: Secondary | ICD-10-CM | POA: Diagnosis not present

## 2021-08-29 DIAGNOSIS — I12 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 stage renal disease: Secondary | ICD-10-CM | POA: Diagnosis present

## 2021-08-29 DIAGNOSIS — L97529 Non-pressure chronic ulcer of other part of left foot with unspecified severity: Secondary | ICD-10-CM | POA: Diagnosis not present

## 2021-08-29 DIAGNOSIS — E119 Type 2 diabetes mellitus without complications: Secondary | ICD-10-CM

## 2021-08-29 DIAGNOSIS — Z89412 Acquired absence of left great toe: Secondary | ICD-10-CM | POA: Diagnosis not present

## 2021-08-29 DIAGNOSIS — E1152 Type 2 diabetes mellitus with diabetic peripheral angiopathy with gangrene: Secondary | ICD-10-CM | POA: Diagnosis not present

## 2021-08-29 DIAGNOSIS — Z992 Dependence on renal dialysis: Secondary | ICD-10-CM | POA: Diagnosis not present

## 2021-08-29 DIAGNOSIS — Z886 Allergy status to analgesic agent status: Secondary | ICD-10-CM | POA: Diagnosis not present

## 2021-08-29 DIAGNOSIS — Z8673 Personal history of transient ischemic attack (TIA), and cerebral infarction without residual deficits: Secondary | ICD-10-CM | POA: Diagnosis not present

## 2021-08-29 DIAGNOSIS — E861 Hypovolemia: Secondary | ICD-10-CM | POA: Diagnosis present

## 2021-08-29 DIAGNOSIS — Z8249 Family history of ischemic heart disease and other diseases of the circulatory system: Secondary | ICD-10-CM

## 2021-08-29 DIAGNOSIS — N2581 Secondary hyperparathyroidism of renal origin: Secondary | ICD-10-CM | POA: Diagnosis not present

## 2021-08-29 DIAGNOSIS — Z7982 Long term (current) use of aspirin: Secondary | ICD-10-CM | POA: Diagnosis not present

## 2021-08-29 DIAGNOSIS — L02612 Cutaneous abscess of left foot: Secondary | ICD-10-CM | POA: Diagnosis not present

## 2021-08-29 DIAGNOSIS — M86672 Other chronic osteomyelitis, left ankle and foot: Secondary | ICD-10-CM | POA: Diagnosis not present

## 2021-08-29 DIAGNOSIS — Z87891 Personal history of nicotine dependence: Secondary | ICD-10-CM

## 2021-08-29 DIAGNOSIS — E872 Acidosis, unspecified: Secondary | ICD-10-CM | POA: Diagnosis not present

## 2021-08-29 DIAGNOSIS — E11621 Type 2 diabetes mellitus with foot ulcer: Secondary | ICD-10-CM | POA: Diagnosis present

## 2021-08-29 DIAGNOSIS — Z79899 Other long term (current) drug therapy: Secondary | ICD-10-CM

## 2021-08-29 DIAGNOSIS — Z5181 Encounter for therapeutic drug level monitoring: Secondary | ICD-10-CM

## 2021-08-29 DIAGNOSIS — N186 End stage renal disease: Secondary | ICD-10-CM | POA: Diagnosis present

## 2021-08-29 DIAGNOSIS — E1122 Type 2 diabetes mellitus with diabetic chronic kidney disease: Secondary | ICD-10-CM | POA: Diagnosis present

## 2021-08-29 DIAGNOSIS — L089 Local infection of the skin and subcutaneous tissue, unspecified: Secondary | ICD-10-CM | POA: Diagnosis not present

## 2021-08-29 DIAGNOSIS — E11628 Type 2 diabetes mellitus with other skin complications: Secondary | ICD-10-CM | POA: Diagnosis not present

## 2021-08-29 DIAGNOSIS — R600 Localized edema: Secondary | ICD-10-CM | POA: Diagnosis not present

## 2021-08-29 DIAGNOSIS — C679 Malignant neoplasm of bladder, unspecified: Secondary | ICD-10-CM | POA: Diagnosis present

## 2021-08-29 DIAGNOSIS — E785 Hyperlipidemia, unspecified: Secondary | ICD-10-CM | POA: Diagnosis present

## 2021-08-29 DIAGNOSIS — Z794 Long term (current) use of insulin: Secondary | ICD-10-CM

## 2021-08-29 DIAGNOSIS — M7989 Other specified soft tissue disorders: Secondary | ICD-10-CM | POA: Diagnosis not present

## 2021-08-29 DIAGNOSIS — M79672 Pain in left foot: Secondary | ICD-10-CM | POA: Diagnosis not present

## 2021-08-29 DIAGNOSIS — S98112A Complete traumatic amputation of left great toe, initial encounter: Secondary | ICD-10-CM | POA: Diagnosis not present

## 2021-08-29 DIAGNOSIS — I70212 Atherosclerosis of native arteries of extremities with intermittent claudication, left leg: Secondary | ICD-10-CM | POA: Diagnosis not present

## 2021-08-29 DIAGNOSIS — E1151 Type 2 diabetes mellitus with diabetic peripheral angiopathy without gangrene: Secondary | ICD-10-CM | POA: Diagnosis not present

## 2021-08-29 LAB — CBC WITH DIFFERENTIAL/PLATELET
Abs Immature Granulocytes: 0.05 10*3/uL (ref 0.00–0.07)
Basophils Absolute: 0.1 10*3/uL (ref 0.0–0.1)
Basophils Relative: 1 %
Eosinophils Absolute: 0.3 10*3/uL (ref 0.0–0.5)
Eosinophils Relative: 2 %
HCT: 29.6 % — ABNORMAL LOW (ref 36.0–46.0)
Hemoglobin: 9.1 g/dL — ABNORMAL LOW (ref 12.0–15.0)
Immature Granulocytes: 0 %
Lymphocytes Relative: 9 %
Lymphs Abs: 1.2 10*3/uL (ref 0.7–4.0)
MCH: 29.4 pg (ref 26.0–34.0)
MCHC: 30.7 g/dL (ref 30.0–36.0)
MCV: 95.8 fL (ref 80.0–100.0)
Monocytes Absolute: 1.3 10*3/uL — ABNORMAL HIGH (ref 0.1–1.0)
Monocytes Relative: 10 %
Neutro Abs: 10.6 10*3/uL — ABNORMAL HIGH (ref 1.7–7.7)
Neutrophils Relative %: 78 %
Platelets: 380 10*3/uL (ref 150–400)
RBC: 3.09 MIL/uL — ABNORMAL LOW (ref 3.87–5.11)
RDW: 15.7 % — ABNORMAL HIGH (ref 11.5–15.5)
WBC: 13.6 10*3/uL — ABNORMAL HIGH (ref 4.0–10.5)
nRBC: 0 % (ref 0.0–0.2)

## 2021-08-29 LAB — GLUCOSE, CAPILLARY: Glucose-Capillary: 98 mg/dL (ref 70–99)

## 2021-08-29 LAB — COMPREHENSIVE METABOLIC PANEL
ALT: 23 U/L (ref 0–44)
AST: 34 U/L (ref 15–41)
Albumin: 2.6 g/dL — ABNORMAL LOW (ref 3.5–5.0)
Alkaline Phosphatase: 94 U/L (ref 38–126)
Anion gap: 10 (ref 5–15)
BUN: 18 mg/dL (ref 8–23)
CO2: 34 mmol/L — ABNORMAL HIGH (ref 22–32)
Calcium: 8.2 mg/dL — ABNORMAL LOW (ref 8.9–10.3)
Chloride: 91 mmol/L — ABNORMAL LOW (ref 98–111)
Creatinine, Ser: 3.85 mg/dL — ABNORMAL HIGH (ref 0.44–1.00)
GFR, Estimated: 12 mL/min — ABNORMAL LOW (ref >60)
Glucose, Bld: 135 mg/dL — ABNORMAL HIGH (ref 70–99)
Potassium: 3.4 mmol/L — ABNORMAL LOW (ref 3.5–5.1)
Sodium: 135 mmol/L (ref 135–145)
Total Bilirubin: 0.5 mg/dL (ref 0.3–1.2)
Total Protein: 7.7 g/dL (ref 6.5–8.1)

## 2021-08-29 LAB — RESP PANEL BY RT-PCR (FLU A&B, COVID) ARPGX2
Influenza A by PCR: NEGATIVE
Influenza B by PCR: NEGATIVE
SARS Coronavirus 2 by RT PCR: NEGATIVE

## 2021-08-29 LAB — LACTIC ACID, PLASMA
Lactic Acid, Venous: 1.4 mmol/L (ref 0.5–1.9)
Lactic Acid, Venous: 1.3 mmol/L (ref 0.5–1.9)

## 2021-08-29 MED ORDER — VANCOMYCIN HCL 1500 MG/300ML IV SOLN
1500.0000 mg | Freq: Once | INTRAVENOUS | Status: AC
  Administered 2021-08-29: 1500 mg via INTRAVENOUS
  Filled 2021-08-29: qty 300

## 2021-08-29 MED ORDER — PIPERACILLIN-TAZOBACTAM IN DEX 2-0.25 GM/50ML IV SOLN
2.2500 g | Freq: Three times a day (TID) | INTRAVENOUS | Status: DC
  Administered 2021-08-29 – 2021-08-31 (×7): 2.25 g via INTRAVENOUS
  Filled 2021-08-29 (×11): qty 50

## 2021-08-29 MED ORDER — VANCOMYCIN VARIABLE DOSE PER UNSTABLE RENAL FUNCTION (PHARMACIST DOSING): Status: DC

## 2021-08-29 MED ORDER — VANCOMYCIN HCL IN DEXTROSE 1-5 GM/200ML-% IV SOLN
1000.0000 mg | Freq: Once | INTRAVENOUS | Status: DC
  Filled 2021-08-29: qty 200

## 2021-08-29 MED ORDER — PIPERACILLIN-TAZOBACTAM 3.375 G IVPB 30 MIN
3.3750 g | Freq: Once | INTRAVENOUS | Status: DC
  Filled 2021-08-29: qty 50

## 2021-08-29 NOTE — Progress Notes (Addendum)
A consult was received from an ED physician for vanc per pharmacy dosing.  The patient's profile has been reviewed for ht/wt/allergies/indication/available labs.   A one time order has been placed for vanc 1000mg .  Further antibiotics/pharmacy consults should be ordered by admitting physician if indicated.                       Thank you, Kara Mead 08/29/2021  6:32 PM

## 2021-08-29 NOTE — H&P (Addendum)
History and Physical    Patient: Joanne Carlson HBZ:[id number] DOB: Jul 08, 1945 DOA: 08/29/2021 DOS: the patient was seen and examined on 08/29/2021 PCP: Vernie Shanks, MD  Patient coming from: Home  Chief Complaint:  Chief Complaint  Patient presents with   toe swelling    HPI: Joanne Carlson is a 76 y.o. female with medical history significant of PAD s/p DES bilaterally on aspirin and plavix, ESRD HD TTS, Type 2 DM, bladder cancer s/p TURBT, HLD, iron deficiency anemia who presents with worsening non-healing ulcer of left great toe.   Pt is a limited historian. Has been following Podiatry Dr. March Rummage at [facility] since 03/2021 for dry gangrene of the left hallux. Reports this has worsened since December. More darkening discoloration and now has purulent drainage.  No fever. She has known peripheral vascular disease followed by cardiologist Dr. Alvester Chou.She had peripheral angiogram on 03/03/2020 revealing minimal disease in the mid and distal right SFA in the 50% range, patent left external iliac artery stents with high-grade segmental calcified proximal mid left SFA stenosis.  She underwent shockwave intravascular lithotripsy and drug-coated balloon angioplasty with good results. She recently had transurethral resection of bladder tumor on 07/22/2021 with dual antiplatelet held but has resumed them.  In the ED she was afebrile and normotensive.  Left foot x-ray showed 6 mm lucency to the distal phalanx of the left great toe concerning for osteomyelitis.  Had leukocytosis of 13.6 K, creatinine is stable at 3.85 from a prior 7.26.  She had dialysis earlier today.  Review of Systems: As mentioned in the history of present illness. All other systems reviewed and are negative. Past Medical History:  Diagnosis Date   Arthritis    "legs, knees; not bad" (01/30/2018)   Dizziness    occasionally   ESRD (end stage renal disease) ([place])    [name]; TTS" (01/30/2018)   History of colon  polyps    History of gout    takes Allopurinol daily (01/30/2018)   Hyperlipidemia    takes Pravastatin daily   Hypertension    takes Monopril daily   Joint pain    PAD (peripheral artery disease) ([place])    Peripheral neuropathy    Peripheral vascular disease ([place])    Pre-diabetes    Refusal of blood transfusions as patient is Jehovah's Witness    NO BLOOD OR BLOOD PRODUCTS. Albumin okay.    Stroke [facility]) 1990s   "mini stroke; left lower mouth a little bit twisted since" (01/30/2018)   Past Surgical History:  Procedure Laterality Date   ABDOMINAL AORTOGRAM W/LOWER EXTREMITY N/A 01/30/2018   Procedure: ABDOMINAL AORTOGRAM W/LOWER EXTREMITY;  Surgeon: Lorretta Harp, MD;  Location: [place] [facility];  Service: Cardiovascular;  Laterality: N/A;   ABDOMINAL AORTOGRAM W/LOWER EXTREMITY N/A 03/03/2020   Procedure: ABDOMINAL AORTOGRAM W/LOWER EXTREMITY;  Surgeon: Lorretta Harp, MD;  Location: [place] [facility];  Service: Cardiovascular;  Laterality: N/A;   APPENDECTOMY     BASCILIC VEIN TRANSPOSITION Right 10/03/2013   Procedure: BRACHIOCEPHALIC Arteriovenous Fistula ;  Surgeon: Mal Misty, MD;  Location: [place];  Service: Vascular;  Laterality: Right;   [place] Right 11/17/2016   Procedure: BASCILIC VEIN TRANSPOSITION- RIGHT SINGLE STAGE;  Surgeon: Conrad [place], MD;  Location: [place];  Service: Vascular;  Laterality: Right;   COLONOSCOPY     CYST EXCISION  1980's   cyst removed from lower abdomen   DILATION AND CURETTAGE OF UTERUS  1987   ESOPHAGOGASTRODUODENOSCOPY     PERIPHERAL VASCULAR BALLOON ANGIOPLASTY Right 01/30/2018   Procedure: PERIPHERAL VASCULAR BALLOON ANGIOPLASTY;  Surgeon: Lorretta Harp, MD;  Location: [place] [facility];  Service: Cardiovascular;  Laterality: Right;  superficial femoral   PERIPHERAL VASCULAR INTERVENTION  03/03/2020   Procedure: PERIPHERAL VASCULAR INTERVENTION;  Surgeon: Lorretta Harp, MD;  Location: [place] [facility];  Service: Cardiovascular;;  external iliac stent left sfa lithotripsy/ drug coated balloon   SHOULDER ARTHROSCOPY W/ ROTATOR CUFF REPAIR Right 2013   TRANSURETHRAL RESECTION OF BLADDER TUMOR N/A 07/22/2021   Procedure: TRANSURETHRAL RESECTION OF BLADDER TUMOR (TURBT);  Surgeon: Vira Agar, MD;  Location: [facility];  Service: Urology;  Laterality: N/A;   TUBAL LIGATION  1986   UPPER EXTREMITY VENOGRAPHY Bilateral 11/08/2016   Procedure: Bilateral Upper Extremity Venography;  Surgeon: Conrad , MD;  Location: [place] [facility];  Service: Cardiovascular;  Laterality: Bilateral;   Social History:  reports that she quit smoking about 53 years ago. Her smoking use included cigarettes. She has a 2.50 pack-year smoking history. She has never used smokeless tobacco. She reports that she does not currently use alcohol. She reports that she does not currently use drugs after having used the following drugs: Marijuana.  Allergies  Allergen Reactions   Tylenol [Acetaminophen] Rash    Family History  Problem Relation Age of Onset   Depression Mother    Hypertension Mother    Depression Father    Depression Sister    Hypertension Sister     Prior to Admission medications   Medication Sig Start Date End Date Taking? Authorizing Provider  allopurinol (ZYLOPRIM) 100 MG tablet Take 100 mg by mouth daily. 05/25/13   [provider]  aspirin EC 81 MG tablet Take 81 mg by mouth daily.    [provider]  atorvastatin (LIPITOR) 80 MG tablet TAKE 1 TABLET BY MOUTH ONCE DAILY AT  6:00 PM 06/11/21   Lorretta Harp, MD  Blood Glucose Monitoring Suppl (ACCU-CHEK AVIVA PLUS) w/Device KIT  10/29/17   [provider]  calcium acetate (PHOSLO) 667 MG capsule Take 667 mg by mouth 3 (three) times daily with meals.  06/22/13   [provider]  cephALEXin (KEFLEX) 500 MG capsule Take 1 capsule (500 mg total) by mouth 2 (two) times daily. 07/22/21   Vira Agar, MD   cinacalcet (SENSIPAR) 60 MG tablet Take 60 mg by mouth daily.     [provider]  gabapentin (NEURONTIN) 100 MG capsule Take 1 capsule by mouth every morning and 1 capsule at bedtime. PATIENT NEEDS OFFICE VISIT FOR ADDITIONAL REFILLS Patient taking differently: Take 200 mg by mouth at bedtime as needed (pain). 05/25/13   Collene Leyden, PA-C  glucose blood test strip Use to test blood sugar daily. Dx code: 250.02. 11/06/12   Theda Sers, PA-C  Lancets MISC Use to test blood sugar daily. Dx code 17.02. 11/08/12   Collene Leyden, PA-C  lidocaine-prilocaine (EMLA) cream Apply 1 application topically Every Tuesday,Thursday,and Saturday with dialysis.  01/12/18   [provider]  midodrine (PROAMATINE) 10 MG tablet Take 10 mg by mouth Every Tuesday,Thursday,and Saturday with dialysis.  04/05/19   [provider]  multivitamin (RENA-VIT) TABS tablet Take 1 tablet by mouth daily.    [provider]  oxybutynin (DITROPAN) 5 MG tablet Take 1 tablet (5 mg total) by mouth 3 (three) times daily as needed for bladder spasms. 07/22/21  Vira Agar, MD  phenazopyridine (PYRIDIUM) 200 MG tablet Take 1 tablet (200 mg total) by mouth 3 (three) times daily as needed for pain. 07/22/21 07/22/22  Vira Agar, MD    Physical Exam: Vitals:   08/29/21 1622 08/29/21 1624 08/29/21 1631  BP: (!) 123/44 (!) 128/48   Pulse: 95    Resp: 18    Temp: 98.2 F (36.8 C)    TempSrc: Oral    SpO2: 91%    Weight:   72.6 kg  Height:   _0  (1.626 m)   Constitutional: NAD, calm, comfortable, well-appearing elderly female appearing younger than stated age sitting upright in bed Eyes: lids and conjunctivae normal ENMT: Mucous membranes are moist.  Neck: normal, supple Respiratory: clear to auscultation bilaterally, no wheezing, no crackles. Normal respiratory effort.   Cardiovascular: Regular rate and rhythm, no murmurs / rubs / gallops.  Moderate nonpitting edema bilateral  foot.  Right upper extremity AV fistula Abdomen: no tenderness, Bowel sounds positive.  Musculoskeletal: no clubbing / cyanosis.  Deformed right great toe with missing nail bed.   Skin: Thickened brittle nail bed throughout all toes worse on the left great toe with erythematous and purulent ulcer noted to the distal tip.  Has surrounding darkening discoloration of the left great toe.      Neurologic: CN 2-12 grossly intact.  Strength 5/5 in all 4.  Psychiatric: Normal judgment and insight. Alert and oriented x 3. Normal mood.  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 Alvester Chou. Last peripheral angiogram on 03/03/2020 revealing minimal disease in the mid and distal right SFA in the 50% range, patent left external iliac artery stents with high-grade segmental calcified proximal mid left SFA stenosis.  She underwent shockwave intravascular lithotripsy and drug-coated balloon angioplasty with good results. Will obtain left LE ABI to evaluate -ED physician notes pedal pulse with bedside doppler  - X-ray of the foot with lucency at the base of the distal phalanx concerning for osteomyelitis.  Obtain MRI w/out contrast of left foot.  -Follows with Dr. March Rummage at [facility]-needs formal consult tomorrow -continue IV vancomycin, Zosyn   Peripheral vascular disease -History of bilateral SFA stent and follows with cardiology Dr. Alvester Chou -Hold aspirin and Plavix overnight pending MRI and podiatry consult for likely debridement/amputation of her left great toe  ESRD HD TTS -had HD today.  Creatinine stable at 3.85. -Transferred to [facility] and will need nephrology for scheduled  Metabolic acidosis -Secondary to renal insufficiency and likely more hypovolemic today from dialysis  History of bladder tumor - S/p TURBT on 07/22/2021  Type 2 diabetes Controlled.  Hemoglobin A1c of 5.7 in  07/2021       Advance Care Planning:   Code Status: Prior full  Consults: Need podiatry and possibly vascular cardiology consult  Family Communication: Discussed with patient and daughter at bedside  Severity of Illness: The appropriate patient status for this patient is INPATIENT. Inpatient status is judged to be reasonable and necessary in order to provide the required intensity of service to ensure the patient's safety. The patient's presenting symptoms, physical exam findings, and initial radiographic and laboratory data in the context of their chronic comorbidities is felt to place them at high risk for further clinical deterioration. Furthermore, it is not anticipated that the patient will be medically stable for discharge from the hospital within 2 midnights of admission.   *  I certify that at the point of admission it is my clinical judgment that the patient will require inpatient hospital care spanning beyond 2 midnights from the point of admission due to high intensity of service, high risk for further deterioration and high frequency of surveillance required.*  Author: Orene Desanctis, DO 08/29/2021 6:29 PM  For on call review [email or web address].

## 2021-08-29 NOTE — ED Triage Notes (Signed)
Patient reports toes on left foot began bothering her in December, left lower leg, ankle swelling/red. Patient went to UC today and was told she has gangrene, foul odor. Pain rated 4/10

## 2021-08-29 NOTE — ED Provider Notes (Signed)
[facility] DEPT Provider Note   CSN: [id number] Arrival date & time: 08/29/21  1616     History  Chief Complaint  Patient presents with   toe swelling    Joanne Carlson is a 76 y.o. female.  The history is provided by the patient and medical records. No language interpreter was used.  Foot Pain This is a new problem. The current episode started more than 2 days ago. The problem occurs constantly. The problem has been gradually worsening. Pertinent negatives include no chest pain, no abdominal pain, no headaches and no shortness of breath. Nothing aggravates the symptoms. Nothing relieves the symptoms. She has tried nothing for the symptoms. The treatment provided no relief.      Home Medications Prior to Admission medications   Medication Sig Start Date End Date Taking? Authorizing Provider  allopurinol (ZYLOPRIM) 100 MG tablet Take 100 mg by mouth daily. 05/25/13   [provider]  aspirin EC 81 MG tablet Take 81 mg by mouth daily.    [provider]  atorvastatin (LIPITOR) 80 MG tablet TAKE 1 TABLET BY MOUTH ONCE DAILY AT  6:00 PM 06/11/21   Lorretta Harp, MD  Blood Glucose Monitoring Suppl (ACCU-CHEK AVIVA PLUS) w/Device KIT  10/29/17   [provider]  calcium acetate (PHOSLO) 667 MG capsule Take 667 mg by mouth 3 (three) times daily with meals.  06/22/13   [provider]  cephALEXin (KEFLEX) 500 MG capsule Take 1 capsule (500 mg total) by mouth 2 (two) times daily. 07/22/21   Vira Agar, MD  cinacalcet (SENSIPAR) 60 MG tablet Take 60 mg by mouth daily.     [provider]  gabapentin (NEURONTIN) 100 MG capsule Take 1 capsule by mouth every morning and 1 capsule at bedtime. PATIENT NEEDS OFFICE VISIT FOR ADDITIONAL REFILLS Patient taking differently: Take 200 mg by mouth at bedtime as needed (pain). 05/25/13   Collene Leyden, PA-C  glucose blood test strip Use to test blood sugar daily. Dx  code: 250.02. 11/06/12   Theda Sers, PA-C  Lancets MISC Use to test blood sugar daily. Dx code 13.02. 11/08/12   Collene Leyden, PA-C  lidocaine-prilocaine (EMLA) cream Apply 1 application topically Every Tuesday,Thursday,and Saturday with dialysis.  01/12/18   [provider]  midodrine (PROAMATINE) 10 MG tablet Take 10 mg by mouth Every Tuesday,Thursday,and Saturday with dialysis.  04/05/19   [provider]  multivitamin (RENA-VIT) TABS tablet Take 1 tablet by mouth daily.    [provider]  oxybutynin (DITROPAN) 5 MG tablet Take 1 tablet (5 mg total) by mouth 3 (three) times daily as needed for bladder spasms. 07/22/21   Vira Agar, MD  phenazopyridine (PYRIDIUM) 200 MG tablet Take 1 tablet (200 mg total) by mouth 3 (three) times daily as needed for pain. 07/22/21 07/22/22  Vira Agar, MD      Allergies    Tylenol [acetaminophen]    Review of Systems   Review of Systems  Constitutional:  Negative for chills, fatigue and fever.  HENT:  Negative for congestion.   Eyes:  Negative for visual disturbance.  Respiratory:  Negative for cough, chest tightness and shortness of breath.   Cardiovascular:  Negative for chest pain and palpitations.  Gastrointestinal:  Negative for abdominal pain, constipation, diarrhea, nausea and vomiting.  Genitourinary:  Negative for dysuria and flank pain.  Musculoskeletal:  Negative for back pain, neck pain and neck stiffness.  Skin:  Positive for color change and wound.  Neurological:  Positive for numbness. Negative for weakness, light-headedness and headaches.  Psychiatric/Behavioral:  Negative for agitation and confusion.   All other systems reviewed and are negative.  Physical Exam Updated Vital Signs BP (!) 128/48    Pulse 95    Temp 98.2 F (36.8 C) (Oral)    Resp 18    Ht 5' 4"  (1.626 m)    Wt 72.6 kg    SpO2 91%    BMI 27.46 kg/m  Physical Exam Vitals and nursing note reviewed.  Constitutional:       General: She is not in acute distress.    Appearance: She is well-developed. She is not ill-appearing, toxic-appearing or diaphoretic.  HENT:     Head: Normocephalic and atraumatic.     Nose: No congestion or rhinorrhea.     Mouth/Throat:     Mouth: Mucous membranes are moist.     Pharynx: No oropharyngeal exudate or posterior oropharyngeal erythema.  Eyes:     Extraocular Movements: Extraocular movements intact.     Conjunctiva/sclera: Conjunctivae normal.     Pupils: Pupils are equal, round, and reactive to light.  Cardiovascular:     Rate and Rhythm: Normal rate and regular rhythm.     Heart sounds: No murmur heard. Pulmonary:     Effort: Pulmonary effort is normal. No respiratory distress.     Breath sounds: Normal breath sounds. No wheezing, rhonchi or rales.  Chest:     Chest wall: No tenderness.  Abdominal:     General: Abdomen is flat.     Palpations: Abdomen is soft.     Tenderness: There is no abdominal tenderness. There is no right CVA tenderness, left CVA tenderness, guarding or rebound.  Musculoskeletal:        General: Swelling and tenderness present. No signs of injury.     Cervical back: Neck supple. No tenderness.  Skin:    General: Skin is warm and dry.     Capillary Refill: Capillary refill takes less than 2 seconds.     Findings: Erythema present.  Neurological:     Mental Status: She is alert.     Sensory: Sensory deficit (at baseline per pt) present.     Motor: No weakness.  Psychiatric:        Mood and Affect: Mood normal.           ED Results / Procedures / Treatments   Labs (all labs ordered are listed, but only abnormal results are displayed) Labs Reviewed  CBC WITH DIFFERENTIAL/PLATELET - Abnormal; Notable for the following components:      Result Value   WBC 13.6 (*)    RBC 3.09 (*)    Hemoglobin 9.1 (*)    HCT 29.6 (*)    RDW 15.7 (*)    Neutro Abs 10.6 (*)    Monocytes Absolute 1.3 (*)    All other components within normal  limits  COMPREHENSIVE METABOLIC PANEL - Abnormal; Notable for the following components:   Potassium 3.4 (*)    Chloride 91 (*)    CO2 34 (*)    Glucose, Bld 135 (*)    Creatinine, Ser 3.85 (*)    Calcium 8.2 (*)    Albumin 2.6 (*)    GFR, Estimated 12 (*)    All other components within normal limits  RESP PANEL BY RT-PCR (FLU A&B, COVID) ARPGX2  CULTURE, BLOOD (ROUTINE X 2)  CULTURE, BLOOD (ROUTINE X 2)  LACTIC  ACID, PLASMA  LACTIC ACID, PLASMA    EKG None  Radiology DG Foot Complete Left  Result Date: 08/29/2021 CLINICAL DATA:  Pain and swelling EXAM: LEFT FOOT - COMPLETE 3+ VIEW COMPARISON:  04/20/2017 FINDINGS: No recent fracture or dislocation is seen. There is possible previous partial resection of distal portion of distal phalanx of big toe. There is small radiolucency in the proximal shaft of distal phalanx of left big toe. Rest of the bony structures are unremarkable. Arterial calcifications are seen in the soft tissues. Bony spurs seen in the tarsometatarsal joints. Small plantar spur is seen in the calcaneus. Calcific bursitis/tendinosis is seen in the Achilles tendon close to calcaneus. IMPRESSION: There is 6 mm faint radiolucency in the remaining base of distal phalanx of left big toe. Possibility of osteomyelitis is not excluded. Follow-up MRI as clinically warranted should be considered. No recent fracture or dislocation is seen. Other findings as described in the body of the report. Electronically Signed   By: Elmer Picker M.D.   On: 08/29/2021 17:37    Procedures Procedures    CRITICAL CARE Performed by: Gwenyth Allegra Margerite Impastato Total critical care time: 35 minutes Critical care time was exclusive of separately billable procedures and treating other patients. Critical care was necessary to treat or prevent imminent or life-threatening deterioration. Critical care was time spent personally by me on the following activities: development of treatment plan with  patient and/or surrogate as well as nursing, discussions with consultants, evaluation of patient's response to treatment, examination of patient, obtaining history from patient or surrogate, ordering and performing treatments and interventions, ordering and review of laboratory studies, ordering and review of radiographic studies, pulse oximetry and re-evaluation of patient's condition.   Medications Ordered in ED Medications  piperacillin-tazobactam (ZOSYN) IVPB 3.375 g (has no administration in time range)    ED Course/ Medical Decision Making/ A&P                           Medical Decision Making Amount and/or Complexity of Data Reviewed Labs: ordered. Radiology: ordered.  Risk Prescription drug management. Decision regarding hospitalization.   Joanne Carlson is a 76 y.o. female with a past medical history significant for ESRD with dialysis TTS, hypertension, hypercholesterolemia, diabetes with peripheral neuropathies, known peripheral vascular disease with critical lower limb ischemia, gout, previous bladder tumor status post surgery, aortic stenosis, obesity, and previous stroke who presents with left toe pain.  According to patient, she has had peripheral vascular disease and has had toe problems for the last year or so.  She saw podiatry 3 weeks ago and they documented concern for left great toe gangrene but no evidence of acute infection.  Plan was to follow-up for reassessment however over the last few days patient has had worsening pain, warmth, swelling, and foul smell developing in the foot.  She went to [facility] urgent care today and was told she needed to come in for further treatment of a gangrenous foot with infection.  According to patient, she is not having any fevers, chills, chest pain, shortness of breath, nausea, vomiting, or other complaints but is describing mild to moderate pain in the left foot and toe.  She reports it does now have swelling and warmth going into the  ankle area when normally it was just some swelling near the toe.  She was seen by family who finally looked at the toe for the first time in several months and is very  concerned about how it looks.  Otherwise she denies complaints.  She also reports she completed her dialysis today without difficulty.   On my exam, patient has a very foul-smelling left great toe that is darkened, has purulence, and is nearly insensate.  She is able to wiggle it.  She reports the sensation is numb at his baseline.  She does have swelling and darkened skin on the dorsum of the foot but there is also a degree of erythema going towards the ankle.  She reports that this swelling is new or and is where she is hurting.  Denies other calf or lower leg pains.  Abdomen nontender.  Lungs clear.  Chest nontender.  Patient otherwise resting comfortably.  A hand-held pencil Doppler was able to get pulses in both the DP and PT arteries on both feet.  Clinically I am concerned patient has developed wet gangrene in the setting of her known diabetes and peripheral vascular disease with known critical limb ischemia.  We will get x-rays to look for subcutaneous gas or bony involvement and get screening labs but anticipate she will need IV antibiotics and admission for further management.  Patient has been seen by [facility] with podiatry and anticipate during her admission they will need to be involved to discuss if patient will need intervention such as amputation.  Patient does not appear septic based on vital signs but given the progression over the last few days of the swelling and warmth and redness I am concerned that now there is bacterial infection that could be starting to spread.  Anticipate admission.   6:20 PM Work-up is continue to return.  Patient does have a leukocytosis of 13.6 and mild anemia.  Metabolic panel shows improved creatinine from prior and patient reports she took dialysis today.  No evidence of  needing emergent dialysis currently.  COVID and flu test negative.  Lactic acid not elevated.  X-ray of the foot does show some possible osteomyelitis but also shows possible previous partial toe amputation but patient reports she is never had any toe procedures done so I am more concerned that this is a more significant osteomyelitis than suspected by radiology.  We will order antibiotics and call for admission.          Final Clinical Impression(s) / ED Diagnoses Final diagnoses:  Osteomyelitis of left foot, unspecified type (HCC)     Clinical Impression: 1. Osteomyelitis of left foot, unspecified type ([place])     Disposition: Admit  This note was prepared with assistance of Dragon voice recognition software. Occasional wrong-word or sound-a-like substitutions may have occurred due to the inherent limitations of voice recognition software.      Tyshay Adee, Gwenyth Allegra, MD 08/29/21 [phone]

## 2021-08-29 NOTE — Progress Notes (Addendum)
Pharmacy Antibiotic Note  Joanne Carlson is a 76 y.o. female admitted on 08/29/2021 with  osteomyelitis .  Pharmacy has been consulted for vanc/zosyn dosing. Note patient is a HD patient - get TuThSat and had dialysis today per Dr. Flossie Buffy.   Plan: Vanc 1500mg  IV x 1, dosing per HD recommendations thereafter Zosyn 2.25g VI q8 per HD dosing rec's  Height: 5\' 4"  (162.6 cm) Weight: 72.6 kg (160 lb) IBW/kg (Calculated) : 54.7  Temp (24hrs), Avg:98.2 F (36.8 C), Min:98.2 F (36.8 C), Max:98.2 F (36.8 C)  Recent Labs  Lab 08/29/21 1719  WBC 13.6*  CREATININE 3.85*  LATICACIDVEN 1.4    Estimated Creatinine Clearance: 12.1 mL/min (A) (by C-G formula based on SCr of 3.85 mg/dL (H)).    Allergies  Allergen Reactions   Tylenol [Acetaminophen] Rash     Thank you for allowing pharmacy to be a part of this patients care.  Kara Mead 08/29/2021 7:33 PM

## 2021-08-29 NOTE — ED Notes (Signed)
ED TO INPATIENT HANDOFF REPORT  ED Nurse Name and Phone #: Clarise Cruz [phone]  S Name/Age/Gender Joanne Carlson 76 y.o. female Room/Bed: WA07/WA07  Code Status   Code Status: Full Code  Home/SNF/Other Home Patient oriented to: self, place, time, and situation Is this baseline? Yes   Triage Complete: Triage complete  Chief Complaint Osteomyelitis [facility]) [M86.9]  Triage Note Patient reports toes on left foot began bothering her in December, left lower leg, ankle swelling/red. Patient went to UC today and was told she has gangrene, foul odor. Pain rated 4/10    Allergies Allergies  Allergen Reactions   Tylenol [Acetaminophen] Rash    Level of Care/Admitting Diagnosis ED Disposition     ED Disposition  Admit   Condition  --   [place]: [place] [[id number]]  Level of Care: Telemetry Medical [104]  May admit patient to [facility] or [facility] if equivalent level of care is available:: No  Covid Evaluation: Asymptomatic Screening Protocol (No Symptoms)  Diagnosis: Osteomyelitis [facility]) [[id number]]  Admitting Physician: Orene Desanctis [[id number]]  Attending Physician: Orene Desanctis [[id number]]  Estimated length of stay: past midnight tomorrow  Certification:: I certify this patient will need inpatient services for at least 2 midnights          B Medical/Surgery History Past Medical History:  Diagnosis Date   Arthritis    "legs, knees; not bad" (01/30/2018)   Dizziness    occasionally   ESRD (end stage renal disease) ([place])    [name]; TTS" (01/30/2018)   History of colon polyps    History of gout    takes Allopurinol daily (01/30/2018)   Hyperlipidemia    takes Pravastatin daily   Hypertension    takes Monopril daily   Joint pain    PAD (peripheral artery disease) ([place])    Peripheral neuropathy    Peripheral vascular disease ([place])    Pre-diabetes    Refusal of blood transfusions as patient is Jehovah's Witness    NO BLOOD OR BLOOD  PRODUCTS. Albumin okay.    Stroke [facility]) 1990s   "mini stroke; left lower mouth a little bit twisted since" (01/30/2018)   Past Surgical History:  Procedure Laterality Date   ABDOMINAL AORTOGRAM W/LOWER EXTREMITY N/A 01/30/2018   Procedure: ABDOMINAL AORTOGRAM W/LOWER EXTREMITY;  Surgeon: Lorretta Harp, MD;  Location: [place] [facility];  Service: Cardiovascular;  Laterality: N/A;   ABDOMINAL AORTOGRAM W/LOWER EXTREMITY N/A 03/03/2020   Procedure: ABDOMINAL AORTOGRAM W/LOWER EXTREMITY;  Surgeon: Lorretta Harp, MD;  Location: [place] [facility];  Service: Cardiovascular;  Laterality: N/A;   APPENDECTOMY     BASCILIC VEIN TRANSPOSITION Right 10/03/2013   Procedure: BRACHIOCEPHALIC Arteriovenous Fistula ;  Surgeon: Mal Misty, MD;  Location: [place];  Service: Vascular;  Laterality: Right;   [place] Right 11/17/2016   Procedure: BASCILIC VEIN TRANSPOSITION- RIGHT SINGLE STAGE;  Surgeon: Conrad [place], MD;  Location: [place];  Service: Vascular;  Laterality: Right;   COLONOSCOPY     CYST EXCISION  1980's   cyst removed from lower abdomen   DILATION AND CURETTAGE OF UTERUS  1987   ESOPHAGOGASTRODUODENOSCOPY     PERIPHERAL VASCULAR BALLOON ANGIOPLASTY Right 01/30/2018   Procedure: PERIPHERAL VASCULAR BALLOON ANGIOPLASTY;  Surgeon: Lorretta Harp, MD;  Location: [place] [facility];  Service: Cardiovascular;  Laterality: Right;  superficial femoral   PERIPHERAL VASCULAR INTERVENTION  03/03/2020   Procedure: PERIPHERAL VASCULAR INTERVENTION;  Surgeon:  Lorretta Harp, MD;  Location: [place] [facility];  Service: Cardiovascular;;  external iliac stent left sfa lithotripsy/ drug coated balloon   SHOULDER ARTHROSCOPY W/ ROTATOR CUFF REPAIR Right 2013   TRANSURETHRAL RESECTION OF BLADDER TUMOR N/A 07/22/2021   Procedure: TRANSURETHRAL RESECTION OF BLADDER TUMOR (TURBT);  Surgeon: Vira Agar, MD;  Location: [facility];  Service: Urology;  Laterality: N/A;   TUBAL  LIGATION  1986   UPPER EXTREMITY VENOGRAPHY Bilateral 11/08/2016   Procedure: Bilateral Upper Extremity Venography;  Surgeon: Conrad [place], MD;  Location: [place] [facility];  Service: Cardiovascular;  Laterality: Bilateral;     A IV Location/Drains/Wounds Patient Lines/Drains/Airways Status     Active Line/Drains/Airways     Name Placement date Placement time Site Days   Peripheral IV 08/29/21 20 G Anterior;Left;Upper Arm 08/29/21  1755  Arm  less than 1   Fistula / Graft Right Upper arm Arteriovenous fistula 11/17/16  1031  Upper arm  1746   Fistula / Graft Right Upper arm Arteriovenous fistula 01/30/18  2011  Upper arm  1307   Fistula / Graft Right Upper arm Arteriovenous fistula 03/03/20  1930  Upper arm  544   Urethral Catheter Courtney RN Triple-lumen 20 Fr. 07/22/21  1340  Triple-lumen  38   Incision (Closed) 07/22/21 Perineum Other (Comment) 07/22/21  0934  -- 38   Wound / Incision (Open or Dehisced) 01/30/18 Other (Comment) Right non healing 01/30/18  2011  Other (Comment)  1307   Wound / Incision (Open or Dehisced) 03/03/20 Other (Comment) Toe (Comment  which one) Anterior;Left brown crusty left great toe non helaing ulcer 03/03/20  1930  Toe (Comment  which one)  544            Intake/Output Last 24 hours No intake or output data in the 24 hours ending 08/29/21 2000  Labs/Imaging Results for orders placed or performed during the hospital encounter of 08/29/21 (from the past 48 hour(s))  CBC with Differential     Status: Abnormal   Collection Time: 08/29/21  5:19 PM  Result Value Ref Range   WBC 13.6 (H) 4.0 - 10.5 K/uL   RBC 3.09 (L) 3.87 - 5.11 MIL/uL   Hemoglobin 9.1 (L) 12.0 - 15.0 g/dL   HCT 29.6 (L) 36.0 - 46.0 %   MCV 95.8 80.0 - 100.0 fL   MCH 29.4 26.0 - 34.0 pg   MCHC 30.7 30.0 - 36.0 g/dL   RDW 15.7 (H) 11.5 - 15.5 %   Platelets 380 150 - 400 K/uL   nRBC 0.0 0.0 - 0.2 %   Neutrophils Relative % 78 %   Neutro Abs 10.6 (H) 1.7 - 7.7 K/uL    Lymphocytes Relative 9 %   Lymphs Abs 1.2 0.7 - 4.0 K/uL   Monocytes Relative 10 %   Monocytes Absolute 1.3 (H) 0.1 - 1.0 K/uL   Eosinophils Relative 2 %   Eosinophils Absolute 0.3 0.0 - 0.5 K/uL   Basophils Relative 1 %   Basophils Absolute 0.1 0.0 - 0.1 K/uL   Immature Granulocytes 0 %   Abs Immature Granulocytes 0.05 0.00 - 0.07 K/uL    Comment: Performed at [facility], [place] [address], [place], [place] [address]  Comprehensive metabolic panel     Status: Abnormal   Collection Time: 08/29/21  5:19 PM  Result Value Ref Range   Sodium 135 135 - 145 mmol/L   Potassium 3.4 (L) 3.5 - 5.1 mmol/L  Chloride 91 (L) 98 - 111 mmol/L   CO2 34 (H) 22 - 32 mmol/L   Glucose, Bld 135 (H) 70 - 99 mg/dL    Comment: Glucose reference range applies only to samples taken after fasting for at least 8 hours.   BUN 18 8 - 23 mg/dL   Creatinine, Ser 3.85 (H) 0.44 - 1.00 mg/dL   Calcium 8.2 (L) 8.9 - 10.3 mg/dL   Total Protein 7.7 6.5 - 8.1 g/dL   Albumin 2.6 (L) 3.5 - 5.0 g/dL   AST 34 15 - 41 U/L   ALT 23 0 - 44 U/L   Alkaline Phosphatase 94 38 - 126 U/L   Total Bilirubin 0.5 0.3 - 1.2 mg/dL   GFR, Estimated 12 (L) >60 mL/min    Comment: (NOTE) Calculated using the CKD-EPI Creatinine Equation (2021)    Anion gap 10 5 - 15    Comment: Performed at [facility], [place] [address] [place], [place] [address]  Lactic acid, plasma     Status: None   Collection Time: 08/29/21  5:19 PM  Result Value Ref Range   Lactic Acid, Venous 1.4 0.5 - 1.9 mmol/L    Comment: Performed at [facility], [place] [address] [place], [place] [address]  Resp Panel by RT-PCR (Flu A&B, Covid) Nasopharyngeal Swab     Status: None   Collection Time: 08/29/21  5:19 PM   Specimen: Nasopharyngeal Swab; Nasopharyngeal(NP) swabs in vial transport medium  Result Value Ref Range   SARS Coronavirus 2 by RT PCR NEGATIVE NEGATIVE    Comment: (NOTE) SARS-CoV-2 target nucleic  acids are NOT DETECTED.  The SARS-CoV-2 RNA is generally detectable in upper respiratory specimens during the acute phase of infection. The lowest concentration of SARS-CoV-2 viral copies this assay can detect is 138 copies/mL. A negative result does not preclude SARS-Cov-2 infection and should not be used as the sole basis for treatment or other patient management decisions. A negative result may occur with  improper specimen collection/handling, submission of specimen other than nasopharyngeal swab, presence of viral mutation(s) within the areas targeted by this assay, and inadequate number of viral copies(<138 copies/mL). A negative result must be combined with clinical observations, patient history, and epidemiological information. The expected result is Negative.  Fact Sheet for Patients:  EntrepreneurPulse.com.au  Fact Sheet for Healthcare Providers:  IncredibleEmployment.be  This test is no t yet approved or cleared by the [place] FDA and  has been authorized for detection and/or diagnosis of SARS-CoV-2 by FDA under an Emergency Use Authorization (EUA). This EUA will remain  in effect (meaning this test can be used) for the duration of the COVID-19 declaration under Section 564(b)(1) of the Act, 21 U.S.C.section 360bbb-3(b)(1), unless the authorization is terminated  or revoked sooner.       Influenza A by PCR NEGATIVE NEGATIVE   Influenza B by PCR NEGATIVE NEGATIVE    Comment: (NOTE) The Xpert Xpress SARS-CoV-2/FLU/RSV plus assay is intended as an aid in the diagnosis of influenza from Nasopharyngeal swab specimens and should not be used as a sole basis for treatment. Nasal washings and aspirates are unacceptable for Xpert Xpress SARS-CoV-2/FLU/RSV testing.  Fact Sheet for Patients: EntrepreneurPulse.com.au  Fact Sheet for Healthcare Providers: IncredibleEmployment.be  This test is not  yet approved or cleared by the [place] FDA and has been authorized for detection and/or diagnosis of SARS-CoV-2 by FDA under an Emergency Use Authorization (EUA). This EUA will remain in effect (meaning this  test can be used) for the duration of the COVID-19 declaration under Section 564(b)(1) of the Act, 21 U.S.C. section 360bbb-3(b)(1), unless the authorization is terminated or revoked.  Performed at [facility], [place] [address] [place], [place] [address]    DG Foot Complete Left  Result Date: 08/29/2021 CLINICAL DATA:  Pain and swelling EXAM: LEFT FOOT - COMPLETE 3+ VIEW COMPARISON:  04/20/2017 FINDINGS: No recent fracture or dislocation is seen. There is possible previous partial resection of distal portion of distal phalanx of big toe. There is small radiolucency in the proximal shaft of distal phalanx of left big toe. Rest of the bony structures are unremarkable. Arterial calcifications are seen in the soft tissues. Bony spurs seen in the tarsometatarsal joints. Small plantar spur is seen in the calcaneus. Calcific bursitis/tendinosis is seen in the Achilles tendon close to calcaneus. IMPRESSION: There is 6 mm faint radiolucency in the remaining base of distal phalanx of left big toe. Possibility of osteomyelitis is not excluded. Follow-up MRI as clinically warranted should be considered. No recent fracture or dislocation is seen. Other findings as described in the body of the report. Electronically Signed   By: Elmer Picker M.D.   On: 08/29/2021 17:37    Pending Labs Unresulted Labs (From admission, onward)     Start     Ordered   08/30/21 0500  CBC  Tomorrow morning,   R        08/29/21 1950   08/30/21 0277  Basic metabolic panel  Tomorrow morning,   R        08/29/21 1950   08/29/21 1656  Lactic acid, plasma  Now then every 2 hours,   STAT      08/29/21 1655   08/29/21 1656  Blood culture (routine x 2)  BLOOD CULTURE X 2,   STAT      08/29/21 1655             Vitals/Pain Today's Vitals   08/29/21 1622 08/29/21 1624 08/29/21 1631  BP: (!) 123/44 (!) 128/48   Pulse: 95    Resp: 18    Temp: 98.2 F (36.8 C)    TempSrc: Oral    SpO2: 91%    Weight:   72.6 kg  Height:   5\' 4"  (1.626 m)  PainSc:   4     Isolation Precautions No active isolations  Medications Medications  vancomycin variable dose per unstable renal function (pharmacist dosing) (has no administration in time range)  vancomycin (VANCOREADY) IVPB 1500 mg/300 mL (has no administration in time range)  piperacillin-tazobactam (ZOSYN) IVPB 2.25 g (has no administration in time range)    Mobility non-ambulatory Low fall risk   Focused Assessments    R Recommendations: See Admitting Provider Note  Report given to:   Additional Notes:

## 2021-08-29 NOTE — ED Notes (Signed)
[facility] called for transport. 

## 2021-08-30 ENCOUNTER — Inpatient Hospital Stay (HOSPITAL_COMMUNITY): Payer: Medicare HMO

## 2021-08-30 DIAGNOSIS — Z794 Long term (current) use of insulin: Secondary | ICD-10-CM | POA: Diagnosis not present

## 2021-08-30 DIAGNOSIS — L0390 Cellulitis, unspecified: Secondary | ICD-10-CM

## 2021-08-30 DIAGNOSIS — E119 Type 2 diabetes mellitus without complications: Secondary | ICD-10-CM

## 2021-08-30 DIAGNOSIS — M86672 Other chronic osteomyelitis, left ankle and foot: Secondary | ICD-10-CM | POA: Diagnosis not present

## 2021-08-30 DIAGNOSIS — N186 End stage renal disease: Secondary | ICD-10-CM | POA: Diagnosis not present

## 2021-08-30 DIAGNOSIS — M869 Osteomyelitis, unspecified: Secondary | ICD-10-CM | POA: Diagnosis not present

## 2021-08-30 LAB — BASIC METABOLIC PANEL
Anion gap: 12 (ref 5–15)
BUN: 19 mg/dL (ref 8–23)
CO2: 32 mmol/L (ref 22–32)
Calcium: 8 mg/dL — ABNORMAL LOW (ref 8.9–10.3)
Chloride: 94 mmol/L — ABNORMAL LOW (ref 98–111)
Creatinine, Ser: 4.01 mg/dL — ABNORMAL HIGH (ref 0.44–1.00)
GFR, Estimated: 11 mL/min — ABNORMAL LOW (ref >60)
Glucose, Bld: 112 mg/dL — ABNORMAL HIGH (ref 70–99)
Potassium: 3.6 mmol/L (ref 3.5–5.1)
Sodium: 138 mmol/L (ref 135–145)

## 2021-08-30 LAB — CBC
HCT: 28.1 % — ABNORMAL LOW (ref 36.0–46.0)
Hemoglobin: 8.4 g/dL — ABNORMAL LOW (ref 12.0–15.0)
MCH: 29 pg (ref 26.0–34.0)
MCHC: 29.9 g/dL — ABNORMAL LOW (ref 30.0–36.0)
MCV: 96.9 fL (ref 80.0–100.0)
Platelets: 368 10*3/uL (ref 150–400)
RBC: 2.9 MIL/uL — ABNORMAL LOW (ref 3.87–5.11)
RDW: 15.6 % — ABNORMAL HIGH (ref 11.5–15.5)
WBC: 12.5 10*3/uL — ABNORMAL HIGH (ref 4.0–10.5)
nRBC: 0 % (ref 0.0–0.2)

## 2021-08-30 MED ORDER — GABAPENTIN 100 MG PO CAPS
100.0000 mg | ORAL_CAPSULE | Freq: Two times a day (BID) | ORAL | Status: DC
  Administered 2021-08-30 – 2021-09-04 (×11): 100 mg via ORAL
  Filled 2021-08-30 (×11): qty 1

## 2021-08-30 MED ORDER — ATORVASTATIN CALCIUM 80 MG PO TABS
80.0000 mg | ORAL_TABLET | Freq: Every day | ORAL | Status: DC
  Administered 2021-08-30 – 2021-09-04 (×6): 80 mg via ORAL
  Filled 2021-08-30 (×6): qty 1

## 2021-08-30 MED ORDER — CINACALCET HCL 30 MG PO TABS
60.0000 mg | ORAL_TABLET | Freq: Every day | ORAL | Status: DC
  Administered 2021-08-30 – 2021-09-04 (×5): 60 mg via ORAL
  Filled 2021-08-30 (×6): qty 2

## 2021-08-30 MED ORDER — CALCIUM ACETATE (PHOS BINDER) 667 MG PO CAPS
667.0000 mg | ORAL_CAPSULE | Freq: Three times a day (TID) | ORAL | Status: DC
  Administered 2021-08-30 – 2021-09-04 (×11): 667 mg via ORAL
  Filled 2021-08-30 (×11): qty 1

## 2021-08-30 NOTE — Assessment & Plan Note (Addendum)
Controlled.  Hemoglobin A1c of 5.7 (07/15/2021) -Diet liberalized to regular diet as patient noted with poor oral intake per dietitian recommendations.   -CBGs remained stable.   -Outpatient follow-up with PCP.

## 2021-08-30 NOTE — Progress Notes (Signed)
ABI exam has been completed.   Results can be found under chart review under CV PROC. 08/30/2021 4:19 PM Jamespaul Secrist RVT, RDMS

## 2021-08-30 NOTE — Assessment & Plan Note (Addendum)
-  Has known PAD followed by cardiology Dr. Gwenlyn Found. Last peripheral angiogram on 03/03/2020 revealing minimal disease in the mid and distal right SFA in the 50% range, patent left external iliac artery stents with high-grade segmental calcified proximal mid left SFA stenosis.  She underwent shockwave intravascular lithotripsy and drug-coated balloon angioplasty with good results. 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   -IV Zosyn was discontinued preoperatively in hopes of cultures being obtained during surgery.  Surgical path obtained and pending.   -Dr. 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 -Patient seen in consultation by ID who are recommending continuation of vancomycin and cefepime with HD for 14 days post OR date on 09/02/2021 with outpatient follow-up in the ID clinic.   -Patient also need follow-up in podiatry clinic 1 week postdischarge.   -PT OT consulted and recommended home health therapies.

## 2021-08-30 NOTE — Consult Note (Addendum)
Reason for Consult: Osteomyelitis toe Referring Physician: Dr. Rudean Curt, DO  Joanne Carlson is an 76 y.o. female.  HPI: Joanne Carlson is a 76 y.o. female with medical history significant of PAD s/p DES bilaterally on aspirin and plavix, ESRD HD TTS, Type 2 DM, bladder cancer s/p TURBT, HLD, iron deficiency anemia who presents with worsening non-healing ulcer of left great toe.   She has been under the care of Dr. Hardie Pulley since 9/3/222 when she was found to have CLI. The toe has been worsening since December.   She is known to Dr. Gwenlyn Found who did an angio. Last angiogram performed 03/03/2020 revealed a patent left iliac stent.  I performed shockwave angioplasty followed by [facility] of her entire proximal and mid left SFA with excellent result.  She did have three-vessel runoff.  Past Medical History:  Diagnosis Date   Arthritis    "legs, knees; not bad" (01/30/2018)   Dizziness    occasionally   ESRD (end stage renal disease) ([place])    Joanne Carlson; TTS" (01/30/2018)   History of colon polyps    History of gout    takes Allopurinol daily (01/30/2018)   Hyperlipidemia    takes Pravastatin daily   Hypertension    takes Monopril daily   Joint pain    PAD (peripheral artery disease) ([place])    Peripheral neuropathy    Peripheral vascular disease ([place])    Pre-diabetes    Refusal of blood transfusions as patient is Jehovah's Witness    NO BLOOD OR BLOOD PRODUCTS. Albumin okay.    Stroke [facility]) 1990s   "mini stroke; left lower mouth a little bit twisted since" (01/30/2018)    Past Surgical History:  Procedure Laterality Date   ABDOMINAL AORTOGRAM W/LOWER EXTREMITY N/A 01/30/2018   Procedure: ABDOMINAL AORTOGRAM W/LOWER EXTREMITY;  Surgeon: Lorretta Harp, MD;  Location: [facility];  Service: Cardiovascular;  Laterality: N/A;   ABDOMINAL AORTOGRAM W/LOWER EXTREMITY N/A 03/03/2020   Procedure: ABDOMINAL AORTOGRAM W/LOWER EXTREMITY;  Surgeon: Lorretta Harp, MD;  Location: [facility];  Service: Cardiovascular;  Laterality: N/A;   APPENDECTOMY     BASCILIC VEIN TRANSPOSITION Right 10/03/2013   Procedure: BRACHIOCEPHALIC Arteriovenous Fistula ;  Surgeon: Mal Misty, MD;  Location: [facility];  Service: Vascular;  Laterality: Right;   [place] Right 11/17/2016   Procedure: BASCILIC VEIN TRANSPOSITION- RIGHT SINGLE STAGE;  Surgeon: Conrad Westway, MD;  Location: [facility];  Service: Vascular;  Laterality: Right;   COLONOSCOPY     CYST EXCISION  1980's   cyst removed from lower abdomen   DILATION AND CURETTAGE OF UTERUS  1987   ESOPHAGOGASTRODUODENOSCOPY     PERIPHERAL VASCULAR BALLOON ANGIOPLASTY Right 01/30/2018   Procedure: PERIPHERAL VASCULAR BALLOON ANGIOPLASTY;  Surgeon: Lorretta Harp, MD;  Location: [facility];  Service: Cardiovascular;  Laterality: Right;  superficial femoral   PERIPHERAL VASCULAR INTERVENTION  03/03/2020   Procedure: PERIPHERAL VASCULAR INTERVENTION;  Surgeon: Lorretta Harp, MD;  Location: [facility];  Service: Cardiovascular;;  external iliac stent left sfa lithotripsy/ drug coated balloon   SHOULDER ARTHROSCOPY W/ ROTATOR CUFF REPAIR Right 2013   TRANSURETHRAL RESECTION OF BLADDER TUMOR N/A 07/22/2021   Procedure: TRANSURETHRAL RESECTION OF BLADDER TUMOR (TURBT);  Surgeon: Vira Agar, MD;  Location: [facility];  Service: Urology;  Laterality: N/A;   TUBAL LIGATION  1986   UPPER EXTREMITY VENOGRAPHY Bilateral 11/08/2016   Procedure: Bilateral Upper  Extremity Venography;  Surgeon: Conrad Muldraugh, MD;  Location: [facility];  Service: Cardiovascular;  Laterality: Bilateral;    Family History  Problem Relation Age of Onset   Depression Mother    Hypertension Mother    Depression Father    Depression Sister    Hypertension Sister     Social History:  reports that she quit smoking about 53 years ago. Her smoking use included cigarettes. She has a 2.50 pack-year smoking history. She has  never used smokeless tobacco. She reports that she does not currently use alcohol. She reports that she does not currently use drugs after having used the following drugs: Marijuana.  Allergies:  Allergies  Allergen Reactions   Tylenol [Acetaminophen] Rash    Medications: I have reviewed the patient's current medications.  Results for orders placed or performed during the hospital encounter of 08/29/21 (from the past 48 hour(s))  CBC with Differential     Status: Abnormal   Collection Time: 08/29/21  5:19 PM  Result Value Ref Range   WBC 13.6 (H) 4.0 - 10.5 K/uL   RBC 3.09 (L) 3.87 - 5.11 MIL/uL   Hemoglobin 9.1 (L) 12.0 - 15.0 g/dL   HCT 29.6 (L) 36.0 - 46.0 %   MCV 95.8 80.0 - 100.0 fL   MCH 29.4 26.0 - 34.0 pg   MCHC 30.7 30.0 - 36.0 g/dL   RDW 15.7 (H) 11.5 - 15.5 %   Platelets 380 150 - 400 K/uL   nRBC 0.0 0.0 - 0.2 %   Neutrophils Relative % 78 %   Neutro Abs 10.6 (H) 1.7 - 7.7 K/uL   Lymphocytes Relative 9 %   Lymphs Abs 1.2 0.7 - 4.0 K/uL   Monocytes Relative 10 %   Monocytes Absolute 1.3 (H) 0.1 - 1.0 K/uL   Eosinophils Relative 2 %   Eosinophils Absolute 0.3 0.0 - 0.5 K/uL   Basophils Relative 1 %   Basophils Absolute 0.1 0.0 - 0.1 K/uL   Immature Granulocytes 0 %   Abs Immature Granulocytes 0.05 0.00 - 0.07 K/uL    Comment: Performed at [facility], [place] [address], [place], [place] [address]  Comprehensive metabolic panel     Status: Abnormal   Collection Time: 08/29/21  5:19 PM  Result Value Ref Range   Sodium 135 135 - 145 mmol/L   Potassium 3.4 (L) 3.5 - 5.1 mmol/L   Chloride 91 (L) 98 - 111 mmol/L   CO2 34 (H) 22 - 32 mmol/L   Glucose, Bld 135 (H) 70 - 99 mg/dL    Comment: Glucose reference range applies only to samples taken after fasting for at least 8 hours.   BUN 18 8 - 23 mg/dL   Creatinine, Ser 3.85 (H) 0.44 - 1.00 mg/dL   Calcium 8.2 (L) 8.9 - 10.3 mg/dL   Total Protein 7.7 6.5 - 8.1 g/dL   Albumin 2.6 (L) 3.5 - 5.0 g/dL    AST 34 15 - 41 U/L   ALT 23 0 - 44 U/L   Alkaline Phosphatase 94 38 - 126 U/L   Total Bilirubin 0.5 0.3 - 1.2 mg/dL   GFR, Estimated 12 (L) >60 mL/min    Comment: (NOTE) Calculated using the CKD-EPI Creatinine Equation (2021)    Anion gap 10 5 - 15    Comment: Performed at [facility], [place] [address] [place], [place] [address]  Lactic acid, plasma     Status: None  Collection Time: 08/29/21  5:19 PM  Result Value Ref Range   Lactic Acid, Venous 1.4 0.5 - 1.9 mmol/L    Comment: Performed at [facility], [place] [address], [place], [place] [address]  Resp Panel by RT-PCR (Flu A&B, Covid) Nasopharyngeal Swab     Status: None   Collection Time: 08/29/21  5:19 PM   Specimen: Nasopharyngeal Swab; Nasopharyngeal(NP) swabs in vial transport medium  Result Value Ref Range   SARS Coronavirus 2 by RT PCR NEGATIVE NEGATIVE    Comment: (NOTE) SARS-CoV-2 target nucleic acids are NOT DETECTED.  The SARS-CoV-2 RNA is generally detectable in upper respiratory specimens during the acute phase of infection. The lowest concentration of SARS-CoV-2 viral copies this assay can detect is 138 copies/mL. A negative result does not preclude SARS-Cov-2 infection and should not be used as the sole basis for treatment or other patient management decisions. A negative result may occur with  improper specimen collection/handling, submission of specimen other than nasopharyngeal swab, presence of viral mutation(s) within the areas targeted by this assay, and inadequate number of viral copies(<138 copies/mL). A negative result must be combined with clinical observations, patient history, and epidemiological information. The expected result is Negative.  Fact Sheet for Patients:  EntrepreneurPulse.com.au  Fact Sheet for Healthcare Providers:  IncredibleEmployment.be  This test is no t yet approved or cleared by the [place] FDA and   has been authorized for detection and/or diagnosis of SARS-CoV-2 by FDA under an Emergency Use Authorization (EUA). This EUA will remain  in effect (meaning this test can be used) for the duration of the COVID-19 declaration under Section 564(b)(1) of the Act, 21 U.S.C.section 360bbb-3(b)(1), unless the authorization is terminated  or revoked sooner.       Influenza A by PCR NEGATIVE NEGATIVE   Influenza B by PCR NEGATIVE NEGATIVE    Comment: (NOTE) The Xpert Xpress SARS-CoV-2/FLU/RSV plus assay is intended as an aid in the diagnosis of influenza from Nasopharyngeal swab specimens and should not be used as a sole basis for treatment. Nasal washings and aspirates are unacceptable for Xpert Xpress SARS-CoV-2/FLU/RSV testing.  Fact Sheet for Patients: EntrepreneurPulse.com.au  Fact Sheet for Healthcare Providers: IncredibleEmployment.be  This test is not yet approved or cleared by the [place] FDA and has been authorized for detection and/or diagnosis of SARS-CoV-2 by FDA under an Emergency Use Authorization (EUA). This EUA will remain in effect (meaning this test can be used) for the duration of the COVID-19 declaration under Section 564(b)(1) of the Act, 21 U.S.C. section 360bbb-3(b)(1), unless the authorization is terminated or revoked.  Performed at [facility], [place] [address], [place], [place] [address]   Lactic acid, plasma     Status: None   Collection Time: 08/29/21 10:30 PM  Result Value Ref Range   Lactic Acid, Venous 1.3 0.5 - 1.9 mmol/L    Comment: Performed at [facility] [address] [place], [place] [address]  Blood culture (routine x 2)     Status: None (Preliminary result)   Collection Time: 08/29/21 10:30 PM   Specimen: BLOOD  Result Value Ref Range   Specimen Description BLOOD SITE NOT SPECIFIED    Special Requests      BOTTLES DRAWN AEROBIC AND ANAEROBIC Blood Culture adequate  volume   Culture      NO GROWTH < 12 HOURS Performed at [facility], [place] [address] [place], [place] [address]    Report Status PENDING  Glucose, capillary     Status: None   Collection Time: 08/29/21 11:17 PM  Result Value Ref Range   Glucose-Capillary 98 70 - 99 mg/dL    Comment: Glucose reference range applies only to samples taken after fasting for at least 8 hours.  CBC     Status: Abnormal   Collection Time: 08/30/21 12:23 AM  Result Value Ref Range   WBC 12.5 (H) 4.0 - 10.5 K/uL   RBC 2.90 (L) 3.87 - 5.11 MIL/uL   Hemoglobin 8.4 (L) 12.0 - 15.0 g/dL   HCT 28.1 (L) 36.0 - 46.0 %   MCV 96.9 80.0 - 100.0 fL   MCH 29.0 26.0 - 34.0 pg   MCHC 29.9 (L) 30.0 - 36.0 g/dL   RDW 15.6 (H) 11.5 - 15.5 %   Platelets 368 150 - 400 K/uL   nRBC 0.0 0.0 - 0.2 %    Comment: Performed at [facility] [address], [place], [place] [address]  Basic metabolic panel     Status: Abnormal   Collection Time: 08/30/21 12:23 AM  Result Value Ref Range   Sodium 138 135 - 145 mmol/L   Potassium 3.6 3.5 - 5.1 mmol/L   Chloride 94 (L) 98 - 111 mmol/L   CO2 32 22 - 32 mmol/L   Glucose, Bld 112 (H) 70 - 99 mg/dL    Comment: Glucose reference range applies only to samples taken after fasting for at least 8 hours.   BUN 19 8 - 23 mg/dL   Creatinine, Ser 4.01 (H) 0.44 - 1.00 mg/dL   Calcium 8.0 (L) 8.9 - 10.3 mg/dL   GFR, Estimated 11 (L) >60 mL/min    Comment: (NOTE) Calculated using the CKD-EPI Creatinine Equation (2021)    Anion gap 12 5 - 15    Comment: Performed at [facility] [address] [place], [place] [address]    DG Foot Complete Left  Result Date: 08/29/2021 CLINICAL DATA:  Pain and swelling EXAM: LEFT FOOT - COMPLETE 3+ VIEW COMPARISON:  04/20/2017 FINDINGS: No recent fracture or dislocation is seen. There is possible previous partial resection of distal portion of distal phalanx of big toe. There is small radiolucency in the proximal shaft of distal phalanx  of left big toe. Rest of the bony structures are unremarkable. Arterial calcifications are seen in the soft tissues. Bony spurs seen in the tarsometatarsal joints. Small plantar spur is seen in the calcaneus. Calcific bursitis/tendinosis is seen in the Achilles tendon close to calcaneus. IMPRESSION: There is 6 mm faint radiolucency in the remaining base of distal phalanx of left big toe. Possibility of osteomyelitis is not excluded. Follow-up MRI as clinically warranted should be considered. No recent fracture or dislocation is seen. Other findings as described in the body of the report. Electronically Signed   By: Elmer Picker M.D.   On: 08/29/2021 17:37    Review of Systems Blood pressure (!) 125/52, pulse 77, temperature 99 F (37.2 C), temperature source Oral, resp. rate 16, height 5\' 4"  (1.626 m), weight 72.6 kg, SpO2 100 %. Physical Exam General: AAO x3, NAD  Dermatological: There is edema present to the left foot with increased temperature compared to contralateral extremity.  There is purulence coming from the wound to the distal portion of the left toe.  See pictures below.          Vascular: Dorsalis Pedis artery and Posterior Tibial artery pedal pulses are decreased bilateral with immedate capillary fill  time. There is no pain with calf compression, swelling, warmth, erythema.   Neruologic: Sensation decreased.  Musculoskeletal: Chronic deformities of right hallux muscular strength 5/5 in all groups tested bilateral.   Assessment/Plan: Osteomyelitis left hallux  Reviewed the x-rays which were concerning for osteomyelitis.  MRI was pending at the time of evaluation.  I discussed with the patient need for transportation but I think the results of the MRI as well as updated vascular studies before proceeding.  Recommend vascular consult or consult to Dr. Gwenlyn Found. For now continue with IV antibiotics.  Dr. Posey Pronto will be taking over call tomorrow and I will discuss the case  with him I let the patient know this as well.  Trula Slade 08/30/2021, 9:42 AM

## 2021-08-30 NOTE — Hospital Course (Addendum)
Joanne Carlson is a 76 y.o. female with medical history significant of PAD s/p DES bilaterally on aspirin and plavix, ESRD HD TTS, Type 2 DM, bladder cancer s/p TURBT, HLD, iron deficiency anemia who presents with worsening non-healing ulcer of left great toe.  MRI done and shows osteomyelitis.  Podiatry consulted/cardiology (vascular) consulted/nephrology consulted.  Plan for partial hallus amputation this morning per podiatry.  ID consultation pending for antibiotic recommendations and duration as well.

## 2021-08-30 NOTE — Assessment & Plan Note (Addendum)
-  Patient followed by nephrology during the hospitalization.  -Patient on HD TTS. -Patient underwent hemodialysis during the hospitalization on her regular days of TTS.  -Status post IV iron during hemodialysis. -Outpatient follow-up in hemodialysis clinic/nephrology.

## 2021-08-30 NOTE — Assessment & Plan Note (Addendum)
-  History of bilateral SFA stent and follows with cardiology Dr. Gwenlyn Found -- Patient maintained on DAPT with aspirin and Plavix during the hospitalization. -Outpatient follow-up with Dr. Gwenlyn Found

## 2021-08-30 NOTE — Progress Notes (Signed)
°  Progress Note   Patient: Joanne Carlson FFM:[id number] DOB: [birth year] DOA: 08/29/2021     1 DOS: the patient was seen and examined on 08/30/2021   Brief hospital course: Joanne Carlson is a 76 y.o. female with medical history significant of PAD s/p DES bilaterally on aspirin and plavix, ESRD HD TTS, Type 2 DM, bladder cancer s/p TURBT, HLD, iron deficiency anemia who presents with worsening non-healing ulcer of left great toe.  MRI done and shows osteomyelitis.  Podiatry consulted.    Assessment and Plan: * Osteomyelitis ([place])- (present on admission) -Has known PAD followed by cardiology Dr. Alvester Chou. Last peripheral angiogram on 03/03/2020 revealing minimal disease in the mid and distal right SFA in the 50% range, patent left external iliac artery stents with high-grade segmental calcified proximal mid left SFA stenosis.  She underwent shockwave intravascular lithotripsy and drug-coated balloon angioplasty with good results. - left LE ABI to evaluate - X-ray of the foot with lucency at the base of the distal phalanx concerning for osteomyelitis- MRI confirms -podiatry consult -continue IV vancomycin, Zosyn     PVD (peripheral vascular disease) ([place]) -History of bilateral SFA stent and follows with cardiology Dr. Alvester Chou -Hold aspirin and Plavix overnight pending MRI and podiatry consult for likely debridement/amputation of her left great toe  Bladder tumor- (present on admission) - S/p TURBT on 07/22/2021  End stage renal disease ([place])- (present on admission) -had HD saturday.  Creatinine stable at 3.85. -will need nephrology consult in AM for HD on 2/28  Type 2 diabetes mellitus without complication, with long-term current use of insulin (H[facility]) Controlled.  Hemoglobin A1c of 5.7 in 07/2021     Subjective: just back from MRI  Physical Exam: Vitals:   08/29/21 2202 08/30/21 0500 08/30/21 0705 08/30/21 0958  BP: (!) 120/54 (!) 125/52  (!) 119/47  Pulse: 77 77  73  Resp: 18 16  18    Temp: 99 F (37.2 C)   98.3 F (36.8 C)  TempSrc: Oral Oral  Oral  SpO2: 100% 100% 100%   Weight:      Height:       In bed, NAD  Data Reviewed: MRI suggestive of osteomyelitis WBC trending down    Disposition: Status is: Inpatient Remains inpatient appropriate because: needs podiatry eval          Planned Discharge Destination:  tbd    Time spent: 85 minutes  Author: Geradine Girt, DO 08/30/2021 12:37 PM  For on call review [email or web address].

## 2021-08-30 NOTE — Assessment & Plan Note (Addendum)
-   S/p TURBT on 07/22/2021. -Outpatient follow-up with urology.

## 2021-08-31 ENCOUNTER — Ambulatory Visit: Payer: Medicare HMO | Admitting: Podiatrist

## 2021-08-31 ENCOUNTER — Encounter (HOSPITAL_COMMUNITY)
Admission: EM | Disposition: A | Discharge status: Home Health | Payer: Self-pay | Source: Home / Self Care | Attending: Internal Medicine

## 2021-08-31 DIAGNOSIS — I70212 Atherosclerosis of native arteries of extremities with intermittent claudication, left leg: Secondary | ICD-10-CM

## 2021-08-31 DIAGNOSIS — I739 Peripheral vascular disease, unspecified: Secondary | ICD-10-CM | POA: Diagnosis not present

## 2021-08-31 DIAGNOSIS — M86672 Other chronic osteomyelitis, left ankle and foot: Secondary | ICD-10-CM | POA: Diagnosis not present

## 2021-08-31 DIAGNOSIS — I998 Other disorder of circulatory system: Secondary | ICD-10-CM

## 2021-08-31 HISTORY — PX: PERIPHERAL VASCULAR BALLOON ANGIOPLASTY: CATH118281

## 2021-08-31 HISTORY — PX: ABDOMINAL AORTOGRAM W/LOWER EXTREMITY: CATH118223

## 2021-08-31 LAB — BASIC METABOLIC PANEL
Anion gap: 13 (ref 5–15)
BUN: 39 mg/dL — ABNORMAL HIGH (ref 8–23)
CO2: 29 mmol/L (ref 22–32)
Calcium: 7.6 mg/dL — ABNORMAL LOW (ref 8.9–10.3)
Chloride: 96 mmol/L — ABNORMAL LOW (ref 98–111)
Creatinine, Ser: 6.72 mg/dL — ABNORMAL HIGH (ref 0.44–1.00)
GFR, Estimated: 6 mL/min — ABNORMAL LOW (ref >60)
Glucose, Bld: 145 mg/dL — ABNORMAL HIGH (ref 70–99)
Potassium: 4 mmol/L (ref 3.5–5.1)
Sodium: 138 mmol/L (ref 135–145)

## 2021-08-31 LAB — POCT ACTIVATED CLOTTING TIME
Activated Clotting Time: 215 seconds
Activated Clotting Time: 257 seconds
Activated Clotting Time: 299 seconds
Activated Clotting Time: 209 seconds
Activated Clotting Time: 161 seconds

## 2021-08-31 LAB — CBC
HCT: 25.9 % — ABNORMAL LOW (ref 36.0–46.0)
Hemoglobin: 7.9 g/dL — ABNORMAL LOW (ref 12.0–15.0)
MCH: 29 pg (ref 26.0–34.0)
MCHC: 30.5 g/dL (ref 30.0–36.0)
MCV: 95.2 fL (ref 80.0–100.0)
Platelets: 344 10*3/uL (ref 150–400)
RBC: 2.72 MIL/uL — ABNORMAL LOW (ref 3.87–5.11)
RDW: 15.5 % (ref 11.5–15.5)
WBC: 13.4 10*3/uL — ABNORMAL HIGH (ref 4.0–10.5)
nRBC: 0 % (ref 0.0–0.2)

## 2021-08-31 LAB — GLUCOSE, CAPILLARY
Glucose-Capillary: 51 mg/dL — ABNORMAL LOW (ref 70–99)
Glucose-Capillary: 75 mg/dL (ref 70–99)
Glucose-Capillary: 106 mg/dL — ABNORMAL HIGH (ref 70–99)
Glucose-Capillary: 80 mg/dL (ref 70–99)

## 2021-08-31 SURGERY — ABDOMINAL AORTOGRAM W/LOWER EXTREMITY
Anesthesia: LOCAL

## 2021-08-31 MED ORDER — ONDANSETRON HCL 4 MG PO TABS: 4.0000 mg | ORAL_TABLET | Freq: Four times a day (QID) | ORAL | Status: DC | PRN

## 2021-08-31 MED ORDER — FENTANYL CITRATE (PF) 100 MCG/2ML IJ SOLN
INTRAMUSCULAR | Status: DC | PRN
  Administered 2021-08-31: 25 ug via INTRAVENOUS

## 2021-08-31 MED ORDER — CLOPIDOGREL BISULFATE 300 MG PO TABS
ORAL_TABLET | ORAL | Status: DC | PRN
  Administered 2021-08-31: 300 mg via ORAL

## 2021-08-31 MED ORDER — ALTEPLASE 2 MG IJ SOLR
2.0000 mg | Freq: Once | INTRAMUSCULAR | Status: DC | PRN
  Filled 2021-08-31: qty 2

## 2021-08-31 MED ORDER — LIDOCAINE-PRILOCAINE 2.5-2.5 % EX CREA: 1.0000 "application " | TOPICAL_CREAM | CUTANEOUS | Status: DC | PRN

## 2021-08-31 MED ORDER — SENNOSIDES-DOCUSATE SODIUM 8.6-50 MG PO TABS: 1.0000 | ORAL_TABLET | Freq: Every evening | ORAL | Status: DC | PRN

## 2021-08-31 MED ORDER — ASPIRIN EC 81 MG PO TBEC
81.0000 mg | DELAYED_RELEASE_TABLET | Freq: Every day | ORAL | Status: DC
  Administered 2021-08-31 – 2021-09-04 (×5): 81 mg via ORAL
  Filled 2021-08-31 (×6): qty 1

## 2021-08-31 MED ORDER — MIDAZOLAM HCL 2 MG/2ML IJ SOLN
INTRAMUSCULAR | Status: AC
  Filled 2021-08-31: qty 2

## 2021-08-31 MED ORDER — HEPARIN SODIUM (PORCINE) 1000 UNIT/ML IJ SOLN
INTRAMUSCULAR | Status: AC
  Filled 2021-08-31: qty 10

## 2021-08-31 MED ORDER — SODIUM CHLORIDE 0.9 % IV SOLN: 100.0000 mL | INTRAVENOUS | Status: DC | PRN

## 2021-08-31 MED ORDER — CHLORHEXIDINE GLUCONATE CLOTH 2 % EX PADS: 6.0000 | MEDICATED_PAD | Freq: Every day | CUTANEOUS | Status: DC

## 2021-08-31 MED ORDER — LABETALOL HCL 5 MG/ML IV SOLN: 10.0000 mg | INTRAVENOUS | Status: DC | PRN

## 2021-08-31 MED ORDER — HEPARIN (PORCINE) IN NACL 1000-0.9 UT/500ML-% IV SOLN
INTRAVENOUS | Status: AC
  Filled 2021-08-31: qty 1000

## 2021-08-31 MED ORDER — SODIUM CHLORIDE 0.9 % IV SOLN: 250.0000 mL | INTRAVENOUS | Status: DC | PRN

## 2021-08-31 MED ORDER — ONDANSETRON HCL 4 MG/2ML IJ SOLN: 4.0000 mg | Freq: Four times a day (QID) | INTRAMUSCULAR | Status: DC | PRN

## 2021-08-31 MED ORDER — HEPARIN SODIUM (PORCINE) 1000 UNIT/ML DIALYSIS
1000.0000 [IU] | INTRAMUSCULAR | Status: DC | PRN
  Filled 2021-08-31: qty 1

## 2021-08-31 MED ORDER — MIDAZOLAM HCL 2 MG/2ML IJ SOLN
INTRAMUSCULAR | Status: DC | PRN
  Administered 2021-08-31: 1 mg via INTRAVENOUS

## 2021-08-31 MED ORDER — SODIUM CHLORIDE 0.9% FLUSH
3.0000 mL | Freq: Two times a day (BID) | INTRAVENOUS | Status: DC
  Administered 2021-09-01 – 2021-09-04 (×6): 3 mL via INTRAVENOUS

## 2021-08-31 MED ORDER — IODIXANOL 320 MG/ML IV SOLN
INTRAVENOUS | Status: DC | PRN
  Administered 2021-08-31: 150 mL via INTRA_ARTERIAL

## 2021-08-31 MED ORDER — SODIUM CHLORIDE 0.9 % IV SOLN
125.0000 mg | INTRAVENOUS | Status: DC
  Administered 2021-09-01 – 2021-09-03 (×2): 125 mg via INTRAVENOUS
  Filled 2021-08-31 (×2): qty 10

## 2021-08-31 MED ORDER — CLOPIDOGREL BISULFATE 300 MG PO TABS
ORAL_TABLET | ORAL | Status: AC
  Filled 2021-08-31: qty 1

## 2021-08-31 MED ORDER — HYDRALAZINE HCL 20 MG/ML IJ SOLN: 5.0000 mg | INTRAMUSCULAR | Status: DC | PRN

## 2021-08-31 MED ORDER — LIDOCAINE HCL (PF) 1 % IJ SOLN: 5.0000 mL | INTRAMUSCULAR | Status: DC | PRN

## 2021-08-31 MED ORDER — LIDOCAINE HCL (PF) 1 % IJ SOLN
INTRAMUSCULAR | Status: DC | PRN
  Administered 2021-08-31: 18 mL via INTRADERMAL

## 2021-08-31 MED ORDER — HEPARIN (PORCINE) IN NACL 1000-0.9 UT/500ML-% IV SOLN
INTRAVENOUS | Status: DC | PRN
  Administered 2021-08-31 (×2): 500 mL

## 2021-08-31 MED ORDER — CLOPIDOGREL BISULFATE 75 MG PO TABS
75.0000 mg | ORAL_TABLET | Freq: Every day | ORAL | Status: DC
  Administered 2021-09-01 – 2021-09-04 (×4): 75 mg via ORAL
  Filled 2021-08-31 (×4): qty 1

## 2021-08-31 MED ORDER — SODIUM CHLORIDE 0.9% FLUSH: 3.0000 mL | INTRAVENOUS | Status: DC | PRN

## 2021-08-31 MED ORDER — LIDOCAINE HCL (PF) 1 % IJ SOLN
INTRAMUSCULAR | Status: AC
  Filled 2021-08-31: qty 30

## 2021-08-31 MED ORDER — ATORVASTATIN CALCIUM 80 MG PO TABS: 80.0000 mg | ORAL_TABLET | Freq: Every day | ORAL | Status: DC

## 2021-08-31 MED ORDER — SODIUM CHLORIDE 0.9 % IV SOLN: INTRAVENOUS | Status: DC

## 2021-08-31 MED ORDER — DOXERCALCIFEROL 4 MCG/2ML IV SOLN
2.0000 ug | INTRAVENOUS | Status: DC
  Administered 2021-09-01 – 2021-09-03 (×2): 2 ug via INTRAVENOUS
  Filled 2021-08-31 (×4): qty 2

## 2021-08-31 MED ORDER — FENTANYL CITRATE (PF) 100 MCG/2ML IJ SOLN
INTRAMUSCULAR | Status: AC
  Filled 2021-08-31: qty 2

## 2021-08-31 MED ORDER — ATROPINE SULFATE 1 MG/10ML IJ SOSY
PREFILLED_SYRINGE | INTRAMUSCULAR | Status: AC
  Filled 2021-08-31: qty 10

## 2021-08-31 MED ORDER — MORPHINE SULFATE (PF) 2 MG/ML IV SOLN
2.0000 mg | INTRAVENOUS | Status: DC | PRN
  Administered 2021-09-04: 2 mg via INTRAVENOUS
  Filled 2021-08-31: qty 1

## 2021-08-31 MED ORDER — PENTAFLUOROPROP-TETRAFLUOROETH EX AERO: 1.0000 "application " | INHALATION_SPRAY | CUTANEOUS | Status: DC | PRN

## 2021-08-31 MED ORDER — HEPARIN SODIUM (PORCINE) 1000 UNIT/ML IJ SOLN
INTRAMUSCULAR | Status: DC | PRN
  Administered 2021-08-31: 8000 [IU] via INTRAVENOUS
  Administered 2021-08-31: 3000 [IU] via INTRAVENOUS

## 2021-08-31 MED ORDER — ACETAMINOPHEN 325 MG PO TABS: 650.0000 mg | ORAL_TABLET | ORAL | Status: DC | PRN

## 2021-08-31 SURGICAL SUPPLY — 21 items
BALLN SCOREFLEX 3.50X20 (BALLOONS) ×3
BALLOON SCOREFLEX 3.50X20 (BALLOONS) IMPLANT
CATH ANGIO 5F PIGTAIL 65CM (CATHETERS) ×1 IMPLANT
CATH CROSS OVER TEMPO 5F (CATHETERS) ×1 IMPLANT
CATH STRAIGHT 5FR 65CM (CATHETERS) ×1 IMPLANT
DCB RANGER 5.0X40 135 (BALLOONS) IMPLANT
KIT ENCORE 26 ADVANTAGE (KITS) ×1 IMPLANT
KIT PV (KITS) ×4 IMPLANT
RANGER DCB 5.0X40 135 (BALLOONS) ×3
SHEATH HIGHFLEX ANSEL 6FRX55 (SHEATH) ×1 IMPLANT
SHEATH PINNACLE 5F 10CM (SHEATH) ×1 IMPLANT
SHEATH PINNACLE 6F 10CM (SHEATH) ×1 IMPLANT
STOPCOCK MORSE 400PSI 3WAY (MISCELLANEOUS) ×1 IMPLANT
SYR MEDRAD MARK 7 150ML (SYRINGE) ×4 IMPLANT
TAPE SHOOT N SEE (TAPE) ×1 IMPLANT
TRANSDUCER W/STOPCOCK (MISCELLANEOUS) ×4 IMPLANT
TRAY PV CATH (CUSTOM PROCEDURE TRAY) ×4 IMPLANT
TUBING CIL FLEX 10 FLL-RA (TUBING) ×1 IMPLANT
WIRE HITORQ VERSACORE ST 145CM (WIRE) ×1 IMPLANT
WIRE ROSEN-J .035X260CM (WIRE) ×1 IMPLANT
WIRE SHEPHERD 6G .014 (WIRE) ×1 IMPLANT

## 2021-08-31 NOTE — Progress Notes (Signed)
°  Subjective:  Patient ID: Joanne Carlson, female    DOB: 12-16-1945,  MRN: [id number]   A 76 y.o. female with medical history significant of PAD s/p DES bilaterally on aspirin and plavix, ESRD HD TTS, Type 2 DM, bladder cancer s/p TURBT, HLD, iron deficiency anemia who presents with nonhealing ulcer of the right great toe that is worsening with infection.  Patient scheduled to undergo angiogram today.  She states she is doing well.  Minimal pain in the foot.  She has extensive vascular history.  Objective:   Vitals:   08/31/21 0436 08/31/21 0815  BP: (!) 118/56 (!) 110/53  Pulse: 87 79  Resp: 20 18  Temp: 98 F (36.7 C) 98.1 F (36.7 C)  SpO2: 97% 95%   General AA&O x3. Normal mood and affect.  Vascular Dorsalis pedis and posterior tibial pulses nonpalpable. Brisk capillary diminished to all digits. Pedal hair not present.  Neurologic Epicritic sensation grossly intact.  Dermatologic There is edema present to the left foot with increased temperature compared to contralateral extremity.  There is purulence coming from the wound to the distal portion of the left toe.  See pictures below.  Orthopedic: MMT 5/5 in dorsiflexion, plantarflexion, inversion, and eversion. Normal joint ROM without pain or crepitus.         Assessment & Plan:  Patient was evaluated and treated and all questions answered.  Left great toe ulceration with underlying osteomyelitis -All questions and concerns were discussed with the patient in extensive detail. -Awaiting angiogram for vascular optimization -We will plan on doing partial hallux amputation to the left side on Wednesday. -She will be n.p.o. after midnight on Tuesday -Weightbearing as tolerated afterwards with a surgical shoe -Continue IV antibiotics -Betadine wet-to-dry dressing change -I discussed with the patient she is a high risk of losing the toe foot versus the leg.  She states understanding.  Felipa Furnace, DPM  Accessible via  secure chat for questions or concerns.

## 2021-08-31 NOTE — Consult Note (Addendum)
Cardiology Consultation:   Patient ID: Joanne Carlson MRN: [id number]; DOB: 05/25/1946  Admit date: 08/29/2021 Date of Consult: 08/31/2021  PCP:  Vernie Shanks, MD   [facility] Providers Cardiologist:  [name], MD      Patient Profile:   Joanne Carlson is a 76 y.o. female with a hx of PVD, ESRD on HD, DM, HLD, HTN, bladder CA s/p TURBT and mild AS who is being seen 08/31/2021 for the evaluation of osteomyelitis, PVD  at the request of Dr. Eliseo Squires.  History of Present Illness:   Joanne Carlson is a 76 yo female with PMH noted above. [name] as an outpatient. She underwent PV angiogram 2018 in the setting of abnormal ABIs with occluded distal right SFA and high grade left SFA stenosis with 3v runoff. She developed a non-healing wound on the right great toe and repeat PV angiogram 2019 showed 99% calcified mid right SFA stenosis treated with balloon atherectomy followed by balloon angioplasty. Wound did heal.   Repeat PV angiogram 03/03/2020 revealing minimal disease in the mid and distal right SFA in the 50% range, patent left external iliac artery stent with high-grade segmental calcified proximal mid left SFA stenosis.  She underwent shockwave intravascular lithotripsy and drug-coated balloon angioplasty with excellent result.   She was seen again in the office 04/2021 in the setting non- healing wound of the left great toe. She was following with podiatry as well.   Presented to the ED on 2/25 with complaints of worsening nonhealing ulcer of her left great toe.  She has been following Dr. March Rummage at [facility] but noted increased darkening and discoloration with purulent drainage.   In the ED her labs showed sodium 135, potassium 3.4, Cr 3.85, lactic acid 1.4>>1.3, WBC 13.6, Hgb 9.1. Left foot Xray showed 6 mm lucency to the distal phalanx of the left great toe concerning for osteomyelitis. She was started on antibiotics and seen by podiatry. ABIs  with moderate left lower extremity disease, severe disease on the left great toe. Cardiology asked to evaluate for PV angiogram.    Past Medical History:  Diagnosis Date   Arthritis    "legs, knees; not bad" (01/30/2018)   Dizziness    occasionally   ESRD (end stage renal disease) ([place])    [name]; TTS" (01/30/2018)   History of colon polyps    History of gout    takes Allopurinol daily (01/30/2018)   Hyperlipidemia    takes Pravastatin daily   Hypertension    takes Monopril daily   Joint pain    PAD (peripheral artery disease) ([place])    Peripheral neuropathy    Peripheral vascular disease ([place])    Pre-diabetes    Refusal of blood transfusions as patient is Jehovah's Witness    NO BLOOD OR BLOOD PRODUCTS. Albumin okay.    Stroke [facility]) 1990s   "mini stroke; left lower mouth a little bit twisted since" (01/30/2018)    Past Surgical History:  Procedure Laterality Date   ABDOMINAL AORTOGRAM W/LOWER EXTREMITY N/A 01/30/2018   Procedure: ABDOMINAL AORTOGRAM W/LOWER EXTREMITY;  Surgeon: Lorretta Harp, MD;  Location: [place] [facility];  Service: Cardiovascular;  Laterality: N/A;   ABDOMINAL AORTOGRAM W/LOWER EXTREMITY N/A 03/03/2020   Procedure: ABDOMINAL AORTOGRAM W/LOWER EXTREMITY;  Surgeon: Lorretta Harp, MD;  Location: [place] [facility];  Service: Cardiovascular;  Laterality: N/A;   APPENDECTOMY     BASCILIC VEIN TRANSPOSITION Right 10/03/2013  Procedure: BRACHIOCEPHALIC Arteriovenous Fistula ;  Surgeon: Mal Misty, MD;  Location: [place];  Service: Vascular;  Laterality: Right;   [place] Right 11/17/2016   Procedure: BASCILIC VEIN TRANSPOSITION- RIGHT SINGLE STAGE;  Surgeon: Conrad Hidalgo, MD;  Location: [place];  Service: Vascular;  Laterality: Right;   COLONOSCOPY     CYST EXCISION  1980's   cyst removed from lower abdomen   DILATION AND CURETTAGE OF UTERUS  1987   ESOPHAGOGASTRODUODENOSCOPY     PERIPHERAL VASCULAR BALLOON ANGIOPLASTY Right  01/30/2018   Procedure: PERIPHERAL VASCULAR BALLOON ANGIOPLASTY;  Surgeon: Lorretta Harp, MD;  Location: [place] [facility];  Service: Cardiovascular;  Laterality: Right;  superficial femoral   PERIPHERAL VASCULAR INTERVENTION  03/03/2020   Procedure: PERIPHERAL VASCULAR INTERVENTION;  Surgeon: Lorretta Harp, MD;  Location: [place] [facility];  Service: Cardiovascular;;  external iliac stent left sfa lithotripsy/ drug coated balloon   SHOULDER ARTHROSCOPY W/ ROTATOR CUFF REPAIR Right 2013   TRANSURETHRAL RESECTION OF BLADDER TUMOR N/A 07/22/2021   Procedure: TRANSURETHRAL RESECTION OF BLADDER TUMOR (TURBT);  Surgeon: Vira Agar, MD;  Location: [facility];  Service: Urology;  Laterality: N/A;   TUBAL LIGATION  1986   UPPER EXTREMITY VENOGRAPHY Bilateral 11/08/2016   Procedure: Bilateral Upper Extremity Venography;  Surgeon: Conrad [place], MD;  Location: [place] [facility];  Service: Cardiovascular;  Laterality: Bilateral;     Home Medications:  Prior to Admission medications   Medication Sig Start Date End Date Taking? Authorizing Provider  allopurinol (ZYLOPRIM) 100 MG tablet Take 100 mg by mouth daily. 05/25/13  Yes [provider]  aspirin EC 81 MG tablet Take 81 mg by mouth daily.   Yes [provider]  atorvastatin (LIPITOR) 80 MG tablet TAKE 1 TABLET BY MOUTH ONCE DAILY AT  6:00 PM Patient taking differently: Take 80 mg by mouth daily. 06/11/21  Yes Lorretta Harp, MD  calcium acetate (PHOSLO) 667 MG capsule Take 667 mg by mouth 3 (three) times daily with meals.  06/22/13  Yes [provider]  cinacalcet (SENSIPAR) 60 MG tablet Take 60 mg by mouth daily.    Yes [provider]  clopidogrel (PLAVIX) 75 MG tablet Take 75 mg by mouth daily. 08/21/21  Yes [provider]  gabapentin (NEURONTIN) 100 MG capsule Take 1 capsule by mouth every morning and 1 capsule at bedtime. PATIENT NEEDS OFFICE VISIT FOR ADDITIONAL REFILLS Patient taking  differently: Take 100-200 mg by mouth at bedtime. 05/25/13  Yes Georgiann Mccoy M, PA-C  lidocaine-prilocaine (EMLA) cream Apply 1 application topically Every Tuesday,Thursday,and Saturday with dialysis.  01/12/18  Yes [provider]  multivitamin (RENA-VIT) TABS tablet Take 1 tablet by mouth daily.   Yes [provider]  oxybutynin (DITROPAN) 5 MG tablet Take 1 tablet (5 mg total) by mouth 3 (three) times daily as needed for bladder spasms. 07/22/21  Yes Vira Agar, MD  Blood Glucose Monitoring Suppl (ACCU-CHEK AVIVA PLUS) w/Device KIT  10/29/17   [provider]  cephALEXin (KEFLEX) 500 MG capsule Take 1 capsule (500 mg total) by mouth 2 (two) times daily. Patient not taking: Reported on 08/29/2021 07/22/21   Vira Agar, MD  glucose blood test strip Use to test blood sugar daily. Dx code: 250.02. 11/06/12   Theda Sers, PA-C  Lancets MISC Use to test blood sugar daily. Dx code 23.02. 11/08/12   Collene Leyden, PA-C  phenazopyridine (PYRIDIUM) 200 MG tablet Take  1 tablet (200 mg total) by mouth 3 (three) times daily as needed for pain. Patient not taking: Reported on 08/29/2021 07/22/21 07/22/22  Vira Agar, MD    Inpatient Medications: Scheduled Meds:  atorvastatin  80 mg Oral Daily   calcium acetate  667 mg Oral TID WC   cinacalcet  60 mg Oral Q supper   gabapentin  100 mg Oral BID   vancomycin variable dose per unstable renal function (pharmacist dosing)   Does not apply See admin instructions   Continuous Infusions:  piperacillin-tazobactam (ZOSYN)  IV 100 mL/hr at 08/31/21 0511   PRN Meds:   Allergies:    Allergies  Allergen Reactions   Tylenol [Acetaminophen] Rash    Social History:   Social History   Socioeconomic History   Marital status: Widowed    Spouse name: Not on file   Number of children: Not on file   Years of education: Not on file   Highest education level: Not on file  Occupational History   Not on file  Tobacco  Use   Smoking status: Former    Packs/day: 0.25    Years: 10.00    Pack years: 2.50    Types: Cigarettes    Quit date: 82    Years since quitting: 53.1   Smokeless tobacco: Never   Tobacco comments:    "never a heavy smoker; smoked off and on"  Vaping Use   Vaping Use: Never used  Substance and Sexual Activity   Alcohol use: Not Currently    Comment: "in my younger days"   Drug use: Not Currently    Types: Marijuana    Comment: "weed in my teens"   Sexual activity: Not Currently    Birth control/protection: Post-menopausal  Other Topics Concern   Not on file  Social History Narrative   Not on file   Social Determinants of Health   Financial Resource Strain: Not on file  Food Insecurity: Not on file  Transportation Needs: Not on file  Physical Activity: Not on file  Stress: Not on file  Social Connections: Not on file  Intimate Partner Violence: Not on file    Family History:    Family History  Problem Relation Age of Onset   Depression Mother    Hypertension Mother    Depression Father    Depression Sister    Hypertension Sister      ROS:  Please see the history of present illness.   All other ROS reviewed and negative.     Physical Exam/Data:   Vitals:   08/30/21 0958 08/30/21 2102 08/31/21 0436 08/31/21 0815  BP: (!) 119/47 (!) 120/58 (!) 118/56 (!) 110/53  Pulse: 73 90 87 79  Resp: _0 Temp: 98.3 F (36.8 C) 98.4 F (36.9 C) 98 F (36.7 C) 98.1 F (36.7 C)  TempSrc: Oral Oral Oral Oral  SpO2:  99% 97% 95%  Weight:      Height:        Intake/Output Summary (Last 24 hours) at 08/31/2021 1035 Last data filed at 08/31/2021 0511 Gross per 24 hour  Intake 310 ml  Output --  Net 310 ml   Last 3 Weights 08/29/2021 07/22/2021 07/22/2021  Weight (lbs) 160 lb 167 lb 12.3 oz 163 lb 3.2 oz  Weight (kg) 72.576 kg 76.1 kg 74.027 kg     Body mass index is 27.46 kg/m.  General:  Well nourished, well developed, in no acute distress HEENT:  normal  Neck: no JVD Vascular: No carotid bruits; Distal pulses 2+ bilaterally Cardiac:  normal S1, S2; RRR; + systolic murmur  Lungs:  clear to auscultation bilaterally. Abd: soft, nontender, no hepatomegaly  Ext: discoloration of left ankle and foot. Left great toe wrapped with dressing.  Musculoskeletal:  No deformities, BUE and BLE strength normal and equal Skin: warm and dry  Neuro:  CNs 2-12 intact, no focal abnormalities noted Psych:  Normal affect   Telemetry:  Telemetry was personally reviewed and demonstrates:  SR, PVCs, brief run of NSVT   Relevant CV Studies:  PV angiogram: 02/2020  Angiographic Data:   1: Abdominal aorta-widely patent 2: Left lower extremity-30% proximal left extra iliac artery stenosis, long diffuse segmental calcified proximal and mid left SFA stenosis in the 80-90 range.  Three-vessel runoff with the anterior tibial being the dominant vessel 3: Right lower extremity-scattered mid and distal 50% SFA stenoses with two-vessel runoff.  The posterior tibial is occluded  Final Impression: Successful PTA and stenting of a iatrogenic dissection involving the left external iliac artery, shockwave angioplasty followed by [facility] of a diffusely diseased segmental proximal mid left SFA with an excellent distal pulse at the end of procedure.  Perclose was deployed successfully achieving hemostasis.]  The patient overnight, gently hydrate her and arrange for her to undergo hemodialysis tomorrow morning after which she can be discharged home.  We will get lower extremity arterial Doppler studies in our [facility] line office next week and I will see her back the week after in follow-up.  She left the lab in stable condition.   [name]. MD, [facility] 03/03/2020 12:36 PM  Echo: 11/2020  IMPRESSIONS     1. Left ventricular ejection fraction, by estimation, is 60 to 65%. The  left ventricle has normal function. The left ventricle has no regional  wall motion abnormalities.  Left ventricular diastolic parameters are  consistent with Grade I diastolic  dysfunction (impaired relaxation). The average left ventricular global  longitudinal strain is -22.9 %. The global longitudinal strain is normal.   2. Right ventricular systolic function is normal. The right ventricular  size is normal. There is normal pulmonary artery systolic pressure.   3. The mitral valve is normal in structure. Trivial mitral valve  regurgitation. No evidence of mitral stenosis.   4. The aortic valve is calcified. There is moderate calcification of the  aortic valve. There is moderate thickening of the aortic valve. Aortic  valve regurgitation is not visualized. Mild aortic valve stenosis. Aortic  valve area, by VTI measures 1.56  cm. Aortic valve mean gradient measures 11.0 mmHg. Aortic valve Vmax  measures 2.23 m/s.   5. The inferior vena cava is normal in size with greater than 50%  respiratory variability, suggesting right atrial pressure of 3 mmHg.   FINDINGS   Left Ventricle: Left ventricular ejection fraction, by estimation, is 60  to 65%. The left ventricle has normal function. The left ventricle has no  regional wall motion abnormalities. The average left ventricular global  longitudinal strain is -22.9 %.  The global longitudinal strain is normal. The left ventricular internal  cavity size was normal in size. There is no left ventricular hypertrophy.  Left ventricular diastolic parameters are consistent with Grade I  diastolic dysfunction (impaired  relaxation).   Right Ventricle: The right ventricular size is normal. No increase in  right ventricular wall thickness. Right ventricular systolic function is  normal. There is normal pulmonary artery systolic pressure. The tricuspid  regurgitant velocity  is 2.44 m/s, and   with an assumed right atrial pressure of 3 mmHg, the estimated right  ventricular systolic pressure is 16.9 mmHg.   Left Atrium: Left atrial size was normal  in size.   Right Atrium: Right atrial size was normal in size.   Pericardium: There is no evidence of pericardial effusion.   Mitral Valve: The mitral valve is normal in structure. Trivial mitral  valve regurgitation. No evidence of mitral valve stenosis.   Tricuspid Valve: The tricuspid valve is normal in structure. Tricuspid  valve regurgitation is not demonstrated. No evidence of tricuspid  stenosis.   Aortic Valve: The aortic valve is calcified. There is moderate  calcification of the aortic valve. There is moderate thickening of the  aortic valve. Aortic valve regurgitation is not visualized. Mild aortic  stenosis is present. Aortic valve mean gradient  measures 11.0 mmHg. Aortic valve peak gradient measures 19.9 mmHg. Aortic  valve area, by VTI measures 1.56 cm.   Pulmonic Valve: The pulmonic valve was normal in structure. Pulmonic valve  regurgitation is not visualized. No evidence of pulmonic stenosis.   Aorta: The aortic root is normal in size and structure.   Venous: The inferior vena cava is normal in size with greater than 50%  respiratory variability, suggesting right atrial pressure of 3 mmHg.   IAS/Shunts: No atrial level shunt detected by color flow Doppler.   Laboratory Data:  High Sensitivity Troponin:  No results for input(s): TROPONINIHS in the last 720 hours.   Chemistry Recent Labs  Lab 08/29/21 1719 08/30/21 0023 08/31/21 0324  NA 135 138 138  K 3.4* 3.6 4.0  CL 91* 94* 96*  CO2 34* 32 29  GLUCOSE 135* 112* 145*  BUN 18 19 39*  CREATININE 3.85* 4.01* 6.72*  CALCIUM 8.2* 8.0* 7.6*  GFRNONAA 12* 11* 6*  ANIONGAP _0 Recent Labs  Lab 08/29/21 1719  PROT 7.7  ALBUMIN 2.6*  AST 34  ALT 23  ALKPHOS 94  BILITOT 0.5   Lipids No results for input(s): CHOL, TRIG, HDL, LABVLDL, LDLCALC, CHOLHDL in the last 168 hours.  Hematology Recent Labs  Lab 08/29/21 1719 08/30/21 0023 08/31/21 0324  WBC 13.6* 12.5* 13.4*  RBC 3.09* 2.90*  2.72*  HGB 9.1* 8.4* 7.9*  HCT 29.6* 28.1* 25.9*  MCV 95.8 96.9 95.2  MCH 29.4 29.0 29.0  MCHC 30.7 29.9* 30.5  RDW 15.7* 15.6* 15.5  PLT 380 368 344   Thyroid No results for input(s): TSH, FREET4 in the last 168 hours.  BNPNo results for input(s): BNP, PROBNP in the last 168 hours.  DDimer No results for input(s): DDIMER in the last 168 hours.   Radiology/Studies:  MR FOOT LEFT WO CONTRAST  Result Date: 08/30/2021 CLINICAL DATA:  Foot swelling, diabetic, osteomyelitis suspected, xray donePatient reports toes on left foot began bothering her in December, left lower leg, ankle swelling/red. Patient went to UC today and was told she has gangrene, foul odor. Pain rated 4/10 EXAM: MRI OF THE LEFT FOOT WITHOUT CONTRAST TECHNIQUE: Multiplanar, multisequence MR imaging of the left foot was performed. No intravenous contrast was administered. COMPARISON:  Left foot radiographs, 08/29/2021 and 04/20/2017. FINDINGS: Bones/Joint/Cartilage The distal phalanx of the great toe is truncated, which may be from resorption, prior surgery or a combination. There is abnormal signal from the great toe distal phalanx, decreased in T1 and hyperintense on T2, with portions of the low signal intensity cortical line disrupted, predominately along the lateral  margin. A small focus of decreased T1 signal is seen in the subchondral bone along the lateral margin of the head of the proximal phalanx of the great toe, without overlying cortical resorption, felt to be degenerative in origin. There is more diffuse increased T2 signal in the distal aspect of the great toe proximal phalanx. There are no other areas of abnormal bone marrow signal. No joint effusion. Ligaments Intact. Muscles and Tendons Increased T2 signal noted throughout the intrinsic foot musculature. Tendons normal in signal and thickness. Soft tissues Diffuse soft tissue edema, most evident involving the great toe. No fluid collections to suggest an abscess.  IMPRESSION: 1. Abnormal signal from the remaining distal phalanx of the great toe consistent with osteomyelitis. 2. No other evidence of osteomyelitis. Increased T2 signal within the distal aspect of the proximal phalanx of the great toe, consistent with reactive edema. 3. Great toe soft tissue edema with a likely component of cellulitis. No evidence of an abscess. 4. More diffuse soft tissue and intrinsic foot muscle edema. Electronically Signed   By: Lajean Manes M.D.   On: 08/30/2021 10:08   DG Foot Complete Left  Result Date: 08/29/2021 CLINICAL DATA:  Pain and swelling EXAM: LEFT FOOT - COMPLETE 3+ VIEW COMPARISON:  04/20/2017 FINDINGS: No recent fracture or dislocation is seen. There is possible previous partial resection of distal portion of distal phalanx of big toe. There is small radiolucency in the proximal shaft of distal phalanx of left big toe. Rest of the bony structures are unremarkable. Arterial calcifications are seen in the soft tissues. Bony spurs seen in the tarsometatarsal joints. Small plantar spur is seen in the calcaneus. Calcific bursitis/tendinosis is seen in the Achilles tendon close to calcaneus. IMPRESSION: There is 6 mm faint radiolucency in the remaining base of distal phalanx of left big toe. Possibility of osteomyelitis is not excluded. Follow-up MRI as clinically warranted should be considered. No recent fracture or dislocation is seen. Other findings as described in the body of the report. Electronically Signed   By: Elmer Picker M.D.   On: 08/29/2021 17:37   VAS [place] ABI WITH/WO TBI  Result Date: 08/30/2021  LOWER EXTREMITY DOPPLER STUDY Patient Name:  Joanne Carlson  Date of Exam:   08/30/2021 Medical Rec #: [id number]         Accession #:    [id number] Date of Birth: Jul 15, 1945          Patient Gender: F Patient Age:   51 years Exam Location:  [facility] Procedure:      VAS [place] ABI WITH/WO TBI Referring Phys: CHING TU  --------------------------------------------------------------------------------  Indications: Ulceration. High Risk Factors: Hypertension, hyperlipidemia, Diabetes, past history of                    smoking, prior CVA. Other Factors: ESRD(HD), PAD.  Vascular Interventions: LLE angioplasty & stent placement (2021), RLE                         angioplasty & atherectomy (2019). Comparison Study: Previous exam 04/06/21 Performing Technologist: Rogelia Rohrer RVT, RDMS  Examination Guidelines: A complete evaluation includes at minimum, Doppler waveform signals and systolic blood pressure reading at the level of bilateral brachial, anterior tibial, and posterior tibial arteries, when vessel segments are accessible. Bilateral testing is considered an integral part of a complete examination. Photoelectric Plethysmograph (PPG) waveforms and toe systolic pressure readings are included as required and additional duplex testing  as needed. Limited examinations for reoccurring indications may be performed as noted.  ABI Findings: +---------+------------------+-----+----------+---------+  Right     Rt Pressure (mmHg) Index Waveform   Comment    +---------+------------------+-----+----------+---------+  Brachial                                      HD access  +---------+------------------+-----+----------+---------+  PTA       102                0.87  monophasic            +---------+------------------+-----+----------+---------+  DP        111                0.95  biphasic              +---------+------------------+-----+----------+---------+  Great Toe 165                1.41  Abnormal              +---------+------------------+-----+----------+---------+ +---------+------------------+-----+----------+-------+  Left      Lt Pressure (mmHg) Index Waveform   Comment  +---------+------------------+-----+----------+-------+  Brachial  117                      triphasic           +---------+------------------+-----+----------+-------+   PTA       65                 0.56  monophasic          +---------+------------------+-----+----------+-------+  DP        73                 0.62  monophasic          +---------+------------------+-----+----------+-------+  Great Toe 25                 0.21  Abnormal            +---------+------------------+-----+----------+-------+ +-------+-----------+-----------+------------+------------+  ABI/TBI Today's ABI Today's TBI Previous ABI Previous TBI  +-------+-----------+-----------+------------+------------+  Right   0.95        1.41        0.94         1.18          +-------+-----------+-----------+------------+------------+  Left    0.62        0.21        [place]           0.60          +-------+-----------+-----------+------------+------------+   Summary: Right: Resting right ankle-brachial index indicates mild right lower extremity arterial disease. The right toe-brachial index is abnormal (falsely elevated - likely due to vessel calcification). Left: Resting left ankle-brachial index indicates moderate left lower extremity arterial disease. The left toe-brachial index is abnormal (severe).  *See table(s) above for measurements and observations.     Preliminary      Assessment and Plan:   Joanne Carlson is a 76 y.o. female with a hx of PVD, ESRD on HD, DM, HLD, HTN bladder CA s/p TURBT and mild AS who is being seen 08/31/2021 for the evaluation of osteomyelitis, PVD  at the request of Dr. Eliseo Squires.  Osteomyelitis/PVD: does have known hx of PVD with previous interventions. ABIs with severe disease of the left great toe and non-healing gangrenous area. Have discussed proceeding  with PV angiogram with the patient and she is agreeable to proceed. Has been NPO this morning. Dr. Gwenlyn Found is in the cath lab today and will see patient.  -- keep NPO -- antibiotics per primary   ESRD on HD: on TTS schedule -- planned for HD tomorrow   Bladder Tumor: s/p TURBT on 07/22/2021  For questions or updates, please contact  [place] Please consult [email or web address] for contact info under    Signed, Reino Bellis, NP  08/31/2021 10:35 AM   Agree with note by Reino Bellis NP-C  Joanne Carlson is well-known to me for long history of PAD.  I have intervened on both the right and left SFA for critical ischemia resulting in healing of her wounds.  Her last angiogram performed by myself 03/03/2020 was remarkable for stenting of the left extrailiac artery, shockwave angioplasty and drug-coated balloon angioplasty of the left SFA.  She did have three-vessel runoff.  I saw her in the office earlier this year with a left toe wound that was slow to heal being followed by her podiatrist.  She was admitted with critical ischemia and osteo.  Her other medical problems are stable.  She is scheduled for hemodialysis tomorrow.  I agree with PV angio to define her anatomy and optimize her ability to heal.  Lorretta Harp, M.D., FACP, [facility], [place], [address] [place], [place]  [id number]  [phone] 08/31/2021 1:42 PM

## 2021-08-31 NOTE — Interval H&P Note (Signed)
History and Physical Interval Note:  08/31/2021 2:22 PM  Joanne Carlson  has presented today for surgery, with the diagnosis of peripheral vascular disease.  The various methods of treatment have been discussed with the patient and family. After consideration of risks, benefits and other options for treatment, the patient has consented to  Procedure(s): ABDOMINAL AORTOGRAM W/LOWER EXTREMITY (N/A) as a surgical intervention.  The patient's history has been reviewed, patient examined, no change in status, stable for surgery.  I have reviewed the patient's chart and labs.  Questions were answered to the patient's satisfaction.     [name]

## 2021-08-31 NOTE — Progress Notes (Signed)
Procedure: Arterial SHEATH REMOVAL   Pre-Procedure Vital Signs: 114/54 (67) HR 79 RR 17 Spo2 98% RA  Time Removed: 2125 Minutes Held Pressure: 9min Site: Right Femoral  Quality: Level 0 Difficulties: N/A Post Pain Assessment: 0/10 Time Hemostasis Occurred: 2145 Method to obtain Hemostasis: Manual Pressure  Post-Procedure Vital Signs: 114/51 (69) HR 75  RR 18 Spo2 99   Peripheral vascular: +1 Right DP Pulse  Dressing: Gauze and Transparent Dressing  Unexpected outcomes/Interventions: N/A  Pt educated that she is on bedrest and to call her RN for assistance. RN explained to call her RN if she feels any pain/swelling or notices blood running from site.

## 2021-08-31 NOTE — Progress Notes (Signed)
Nephrology, Tobie Poet NP ,made aware that lab was unsuccessful with Stat Hep B lab draw.  Patient transferred to cath lab. RN made cath lab aware if she goes to another unit, that Hep lab still needs to be drawn

## 2021-08-31 NOTE — Progress Notes (Signed)
Mobility Specialist Progress Note    08/31/21 1433  Mobility  Activity Off unit   Will f/u as schedule permits.   [facility] Mobility Specialist  M.S. 5N: [phone]

## 2021-08-31 NOTE — Consult Note (Addendum)
[facility] Renal Consultation Note    Indication for Consultation:  Management of ESRD/hemodialysis; anemia, hypertension/volume and secondary hyperparathyroidism  DJM:EQAS, Edwyna Shell, MD  HPI: Joanne Carlson is a 76 y.o. female with ESRD on HD TTS at [facility]. Her past medical history is significant for PAD s/p DES bilaterally on aspirin and plavix, DM, HLD, HTN bladder CA s/p TURBT (07/22/21), and mild AS.  Patient is being evaluated for a non-healing gangrenous ulcer at L great toe. MRI completed concerning for osteomyelitis. ABXs on board and both Podiatry and Cardiology have been consulted. She is followed by Cardiologist Dr. Alvester Chou in outpatient-s/p BL SFA stent. Her last HD was on 08/29/21 where she received full treatment. Seen and examined patient at bedside this morning. No acute complaints. Denies SOB, CP, and N/V. Labs include: K+ 4.0, BUN 39, SrCr 6.72, Ca 7.6, and Hgb 7.9. Plan for HD tomorrow 09/01/21 per her usual schedule.  Past Medical History:  Diagnosis Date   Arthritis    "legs, knees; not bad" (01/30/2018)   Dizziness    occasionally   ESRD (end stage renal disease) ([place])    [name]; TTS" (01/30/2018)   History of colon polyps    History of gout    takes Allopurinol daily (01/30/2018)   Hyperlipidemia    takes Pravastatin daily   Hypertension    takes Monopril daily   Joint pain    PAD (peripheral artery disease) ([place])    Peripheral neuropathy    Peripheral vascular disease ([place])    Pre-diabetes    Refusal of blood transfusions as patient is Jehovah's Witness    NO BLOOD OR BLOOD PRODUCTS. Albumin okay.    Stroke [facility]) 1990s   "mini stroke; left lower mouth a little bit twisted since" (01/30/2018)   Past Surgical History:  Procedure Laterality Date   ABDOMINAL AORTOGRAM W/LOWER EXTREMITY N/A 01/30/2018   Procedure: ABDOMINAL AORTOGRAM W/LOWER EXTREMITY;  Surgeon: Lorretta Harp, MD;  Location: [place] [facility];  Service:  Cardiovascular;  Laterality: N/A;   ABDOMINAL AORTOGRAM W/LOWER EXTREMITY N/A 03/03/2020   Procedure: ABDOMINAL AORTOGRAM W/LOWER EXTREMITY;  Surgeon: Lorretta Harp, MD;  Location: [place] [facility];  Service: Cardiovascular;  Laterality: N/A;   APPENDECTOMY     BASCILIC VEIN TRANSPOSITION Right 10/03/2013   Procedure: BRACHIOCEPHALIC Arteriovenous Fistula ;  Surgeon: Mal Misty, MD;  Location: [place];  Service: Vascular;  Laterality: Right;   [place] Right 11/17/2016   Procedure: BASCILIC VEIN TRANSPOSITION- RIGHT SINGLE STAGE;  Surgeon: Conrad Northport, MD;  Location: [place];  Service: Vascular;  Laterality: Right;   COLONOSCOPY     CYST EXCISION  1980's   cyst removed from lower abdomen   DILATION AND CURETTAGE OF UTERUS  1987   ESOPHAGOGASTRODUODENOSCOPY     PERIPHERAL VASCULAR BALLOON ANGIOPLASTY Right 01/30/2018   Procedure: PERIPHERAL VASCULAR BALLOON ANGIOPLASTY;  Surgeon: Lorretta Harp, MD;  Location: [place] [facility];  Service: Cardiovascular;  Laterality: Right;  superficial femoral   PERIPHERAL VASCULAR INTERVENTION  03/03/2020   Procedure: PERIPHERAL VASCULAR INTERVENTION;  Surgeon: Lorretta Harp, MD;  Location: [place] [facility];  Service: Cardiovascular;;  external iliac stent left sfa lithotripsy/ drug coated balloon   SHOULDER ARTHROSCOPY W/ ROTATOR CUFF REPAIR Right 2013   TRANSURETHRAL RESECTION OF BLADDER TUMOR N/A 07/22/2021   Procedure: TRANSURETHRAL RESECTION OF BLADDER TUMOR (TURBT);  Surgeon: Vira Agar, MD;  Location: [facility];  Service: Urology;  Laterality:  N/A;   TUBAL LIGATION  1986   UPPER EXTREMITY VENOGRAPHY Bilateral 11/08/2016   Procedure: Bilateral Upper Extremity Venography;  Surgeon: Conrad Havelock, MD;  Location: [place] [facility];  Service: Cardiovascular;  Laterality: Bilateral;   Family History  Problem Relation Age of Onset   Depression Mother    Hypertension Mother    Depression Father    Depression  Sister    Hypertension Sister    Social History:  reports that she quit smoking about 53 years ago. Her smoking use included cigarettes. She has a 2.50 pack-year smoking history. She has never used smokeless tobacco. She reports that she does not currently use alcohol. She reports that she does not currently use drugs after having used the following drugs: Marijuana. Allergies  Allergen Reactions   Tylenol [Acetaminophen] Rash   Prior to Admission medications   Medication Sig Start Date End Date Taking? Authorizing Provider  allopurinol (ZYLOPRIM) 100 MG tablet Take 100 mg by mouth daily. 05/25/13  Yes [provider]  aspirin EC 81 MG tablet Take 81 mg by mouth daily.   Yes [provider]  atorvastatin (LIPITOR) 80 MG tablet TAKE 1 TABLET BY MOUTH ONCE DAILY AT  6:00 PM Patient taking differently: Take 80 mg by mouth daily. 06/11/21  Yes Lorretta Harp, MD  calcium acetate (PHOSLO) 667 MG capsule Take 667 mg by mouth 3 (three) times daily with meals.  06/22/13  Yes [provider]  cinacalcet (SENSIPAR) 60 MG tablet Take 60 mg by mouth daily.    Yes [provider]  clopidogrel (PLAVIX) 75 MG tablet Take 75 mg by mouth daily. 08/21/21  Yes [provider]  gabapentin (NEURONTIN) 100 MG capsule Take 1 capsule by mouth every morning and 1 capsule at bedtime. PATIENT NEEDS OFFICE VISIT FOR ADDITIONAL REFILLS Patient taking differently: Take 100-200 mg by mouth at bedtime. 05/25/13  Yes Georgiann Mccoy M, PA-C  lidocaine-prilocaine (EMLA) cream Apply 1 application topically Every Tuesday,Thursday,and Saturday with dialysis.  01/12/18  Yes [provider]  multivitamin (RENA-VIT) TABS tablet Take 1 tablet by mouth daily.   Yes [provider]  oxybutynin (DITROPAN) 5 MG tablet Take 1 tablet (5 mg total) by mouth 3 (three) times daily as needed for bladder spasms. 07/22/21  Yes Vira Agar, MD  Blood Glucose Monitoring Suppl  (ACCU-CHEK AVIVA PLUS) w/Device KIT  10/29/17   [provider]  cephALEXin (KEFLEX) 500 MG capsule Take 1 capsule (500 mg total) by mouth 2 (two) times daily. Patient not taking: Reported on 08/29/2021 07/22/21   Vira Agar, MD  glucose blood test strip Use to test blood sugar daily. Dx code: 250.02. 11/06/12   Theda Sers, PA-C  Lancets MISC Use to test blood sugar daily. Dx code 4.02. 11/08/12   Collene Leyden, PA-C  phenazopyridine (PYRIDIUM) 200 MG tablet Take 1 tablet (200 mg total) by mouth 3 (three) times daily as needed for pain. Patient not taking: Reported on 08/29/2021 07/22/21 07/22/22  Vira Agar, MD   Current Facility-Administered Medications  Medication Dose Route Frequency Provider Last Rate Last Admin   atorvastatin (LIPITOR) tablet 80 mg  80 mg Oral Daily Eulogio Bear U, DO   80 mg at 08/31/21 0902   calcium acetate (PHOSLO) capsule 667 mg  667 mg Oral TID WC Eulogio Bear U, DO   667 mg at 08/30/21 1722   cinacalcet (SENSIPAR) tablet 60 mg  60 mg Oral Q supper Eulogio Bear  U, DO   60 mg at 08/30/21 1722   gabapentin (NEURONTIN) capsule 100 mg  100 mg Oral BID Eulogio Bear U, DO   100 mg at 08/31/21 0902   ondansetron (ZOFRAN) tablet 4 mg  4 mg Oral Q6H PRN Eulogio Bear U, DO       Or   ondansetron [facility]) injection 4 mg  4 mg Intravenous Q6H PRN Eliseo Squires, Jessica U, DO       piperacillin-tazobactam (ZOSYN) IVPB 2.25 g  2.25 g Intravenous Q8H Arlyn Dunning M, RPH 100 mL/hr at 08/31/21 1159 2.25 g at 08/31/21 1159   senna-docusate (Senokot-S) tablet 1 tablet  1 tablet Oral QHS PRN Eulogio Bear U, DO       vancomycin variable dose per unstable renal function (pharmacist dosing)   Does not apply See admin instructions Adrian Saran [facility]       Labs: Basic Metabolic Panel: Recent Labs  Lab 08/29/21 1719 08/30/21 0023 08/31/21 0324  NA 135 138 138  K 3.4* 3.6 4.0  CL 91* 94* 96*  CO2 34* 32 29  GLUCOSE 135* 112* 145*  BUN 18 19 39*  CREATININE  3.85* 4.01* 6.72*  CALCIUM 8.2* 8.0* 7.6*   Liver Function Tests: Recent Labs  Lab 08/29/21 1719  AST 34  ALT 23  ALKPHOS 94  BILITOT 0.5  PROT 7.7  ALBUMIN 2.6*   No results for input(s): LIPASE, AMYLASE in the last 168 hours. No results for input(s): AMMONIA in the last 168 hours. CBC: Recent Labs  Lab 08/29/21 1719 08/30/21 0023 08/31/21 0324  WBC 13.6* 12.5* 13.4*  NEUTROABS 10.6*  --   --   HGB 9.1* 8.4* 7.9*  HCT 29.6* 28.1* 25.9*  MCV 95.8 96.9 95.2  PLT 380 368 344   Cardiac Enzymes: No results for input(s): CKTOTAL, CKMB, CKMBINDEX, TROPONINI in the last 168 hours. CBG: Recent Labs  Lab 08/29/21 2317  GLUCAP 98   Iron Studies: No results for input(s): IRON, TIBC, TRANSFERRIN, FERRITIN in the last 72 hours. Studies/Results: MR FOOT LEFT WO CONTRAST  Result Date: 08/30/2021 CLINICAL DATA:  Foot swelling, diabetic, osteomyelitis suspected, xray donePatient reports toes on left foot began bothering her in December, left lower leg, ankle swelling/red. Patient went to UC today and was told she has gangrene, foul odor. Pain rated 4/10 EXAM: MRI OF THE LEFT FOOT WITHOUT CONTRAST TECHNIQUE: Multiplanar, multisequence MR imaging of the left foot was performed. No intravenous contrast was administered. COMPARISON:  Left foot radiographs, 08/29/2021 and 04/20/2017. FINDINGS: Bones/Joint/Cartilage The distal phalanx of the great toe is truncated, which may be from resorption, prior surgery or a combination. There is abnormal signal from the great toe distal phalanx, decreased in T1 and hyperintense on T2, with portions of the low signal intensity cortical line disrupted, predominately along the lateral margin. A small focus of decreased T1 signal is seen in the subchondral bone along the lateral margin of the head of the proximal phalanx of the great toe, without overlying cortical resorption, felt to be degenerative in origin. There is more diffuse increased T2 signal in the  distal aspect of the great toe proximal phalanx. There are no other areas of abnormal bone marrow signal. No joint effusion. Ligaments Intact. Muscles and Tendons Increased T2 signal noted throughout the intrinsic foot musculature. Tendons normal in signal and thickness. Soft tissues Diffuse soft tissue edema, most evident involving the great toe. No fluid collections to suggest an abscess. IMPRESSION: 1. Abnormal signal from the remaining  distal phalanx of the great toe consistent with osteomyelitis. 2. No other evidence of osteomyelitis. Increased T2 signal within the distal aspect of the proximal phalanx of the great toe, consistent with reactive edema. 3. Great toe soft tissue edema with a likely component of cellulitis. No evidence of an abscess. 4. More diffuse soft tissue and intrinsic foot muscle edema. Electronically Signed   By: Lajean Manes M.D.   On: 08/30/2021 10:08   DG Foot Complete Left  Result Date: 08/29/2021 CLINICAL DATA:  Pain and swelling EXAM: LEFT FOOT - COMPLETE 3+ VIEW COMPARISON:  04/20/2017 FINDINGS: No recent fracture or dislocation is seen. There is possible previous partial resection of distal portion of distal phalanx of big toe. There is small radiolucency in the proximal shaft of distal phalanx of left big toe. Rest of the bony structures are unremarkable. Arterial calcifications are seen in the soft tissues. Bony spurs seen in the tarsometatarsal joints. Small plantar spur is seen in the calcaneus. Calcific bursitis/tendinosis is seen in the Achilles tendon close to calcaneus. IMPRESSION: There is 6 mm faint radiolucency in the remaining base of distal phalanx of left big toe. Possibility of osteomyelitis is not excluded. Follow-up MRI as clinically warranted should be considered. No recent fracture or dislocation is seen. Other findings as described in the body of the report. Electronically Signed   By: Elmer Picker M.D.   On: 08/29/2021 17:37   VAS [place] ABI WITH/WO  TBI  Result Date: 08/30/2021  LOWER EXTREMITY DOPPLER STUDY Patient Name:  CHIARA COLTRIN  Date of Exam:   08/30/2021 Medical Rec #: [id number]         Accession #:    [id number] Date of Birth: [birth year]          Patient Gender: F Patient Age:   66 years Exam [facility]  [facility] Procedure:      VAS [place] ABI WITH/WO TBI Referring Phys: CHING TU --------------------------------------------------------------------------------  Indications: Ulceration. High Risk Factors: Hypertension, hyperlipidemia, Diabetes, past history of                    smoking, prior CVA. Other Factors: ESRD(HD), PAD.  Vascular Interventions: LLE angioplasty & stent placement (2021), RLE                         angioplasty & atherectomy (2019). Comparison Study: Previous exam 04/06/21 Performing Technologist: Rogelia Rohrer RVT, RDMS  Examination Guidelines: A complete evaluation includes at minimum, Doppler waveform signals and systolic blood pressure reading at the level of bilateral brachial, anterior tibial, and posterior tibial arteries, when vessel segments are accessible. Bilateral testing is considered an integral part of a complete examination. Photoelectric Plethysmograph (PPG) waveforms and toe systolic pressure readings are included as required and additional duplex testing as needed. Limited examinations for reoccurring indications may be performed as noted.  ABI Findings: +---------+------------------+-----+----------+---------+  Right     Rt Pressure (mmHg) Index Waveform   Comment    +---------+------------------+-----+----------+---------+  Brachial                                      HD access  +---------+------------------+-----+----------+---------+  PTA       102                0.87  monophasic            +---------+------------------+-----+----------+---------+  DP        111                0.95  biphasic              +---------+------------------+-----+----------+---------+  Great Toe 165                1.41   Abnormal              +---------+------------------+-----+----------+---------+ +---------+------------------+-----+----------+-------+  Left      Lt Pressure (mmHg) Index Waveform   Comment  +---------+------------------+-----+----------+-------+  Brachial  117                      triphasic           +---------+------------------+-----+----------+-------+  PTA       65                 0.56  monophasic          +---------+------------------+-----+----------+-------+  DP        73                 0.62  monophasic          +---------+------------------+-----+----------+-------+  Great Toe 25                 0.21  Abnormal            +---------+------------------+-----+----------+-------+ +-------+-----------+-----------+------------+------------+  ABI/TBI Today's ABI Today's TBI Previous ABI Previous TBI  +-------+-----------+-----------+------------+------------+  Right   0.95        1.41        0.94         1.18          +-------+-----------+-----------+------------+------------+  Left    0.62        0.21        [place]           0.60          +-------+-----------+-----------+------------+------------+   Summary: Right: Resting right ankle-brachial index indicates mild right lower extremity arterial disease. The right toe-brachial index is abnormal (falsely elevated - likely due to vessel calcification). Left: Resting left ankle-brachial index indicates moderate left lower extremity arterial disease. The left toe-brachial index is abnormal (severe).  *See table(s) above for measurements and observations.     Preliminary     Physical Exam: Vitals:   08/30/21 0958 08/30/21 2102 08/31/21 0436 08/31/21 0815  BP: (!) 119/47 (!) 120/58 (!) 118/56 (!) 110/53  Pulse: 73 90 87 79  Resp: 18 18 20 18   Temp: 98.3 F (36.8 C) 98.4 F (36.9 C) 98 F (36.7 C) 98.1 F (36.7 C)  TempSrc: Oral Oral Oral Oral  SpO2:  99% 97% 95%  Weight:      Height:         General: WDWN NAD Head: NCAT sclera not icteric Lungs: CTA  bilaterally. No wheeze, rales or rhonchi. Breathing is unlabored. Heart: RRR. No murmur, rubs or gallops.  Abdomen: soft, nontender, +BS Lower extremities: 1+ edema noted LLE; noted non-healing ulcer at L great toe: no evidence of drainage Neuro: AAOx3. Answers questions appropriately Dialysis Access: R AVF (+) B/T  Dialysis Orders:  TTS - [place]  3hrs15mn, BFR 350, DFR 600,  EDW 73.7kg, 2K/ 2.5Ca  Heparin 7000 unit bolus with HD Mircera 75 mcg q2wks - last 08/20/21 Hectorol 280m IV qHD- last 08/29/21 Venofer 10068mV X 5 doses-last dose 08/29/21: Needs 3 more doses Home  meds: Midodrine 6m pre-HD; Sensipar 66mdaily  Assessment/Plan: Osteomyelitis L great toe: On ABXs; Podiatry and Cardiology following; plan for PV angiogram following partial hallux amputation on Wed 09/02/21.  PVD: History of bilateral SFA stent; ASA and Plavix held for now; followed by Dr. BaAlvester Chouutpatient ESRD - on HD TTS; plan for HD tomorrow per usual schedule Hypertension/volume  - Normotensive and euvolemic on exam Anemia of CKD - Hgb 7.9; will resume short Fe load (ordered from outpatient setting); ESA recently given on 08/29/21 Secondary Hyperparathyroidism -  Will check PO4 in AM; continue Sensipar for now Nutrition - NPO for now  CoTobie PoetNP [facility] 08/31/2021, 1:31 PM

## 2021-08-31 NOTE — H&P (View-Only) (Signed)
Cardiology Consultation:   Patient ID: Joanne Carlson MRN: [id number]; DOB: 05/25/1946  Admit date: 08/29/2021 Date of Consult: 08/31/2021  PCP:  Vernie Shanks, MD   [facility] Providers Cardiologist:  [name], MD      Patient Profile:   Joanne Carlson is a 76 y.o. female with a hx of PVD, ESRD on HD, DM, HLD, HTN, bladder CA s/p TURBT and mild AS who is being seen 08/31/2021 for the evaluation of osteomyelitis, PVD  at the request of Dr. Eliseo Squires.  History of Present Illness:   Joanne Carlson is a 76 yo female with PMH noted above. [name] as an outpatient. She underwent PV angiogram 2018 in the setting of abnormal ABIs with occluded distal right SFA and high grade left SFA stenosis with 3v runoff. She developed a non-healing wound on the right great toe and repeat PV angiogram 2019 showed 99% calcified mid right SFA stenosis treated with balloon atherectomy followed by balloon angioplasty. Wound did heal.   Repeat PV angiogram 03/03/2020 revealing minimal disease in the mid and distal right SFA in the 50% range, patent left external iliac artery stent with high-grade segmental calcified proximal mid left SFA stenosis.  She underwent shockwave intravascular lithotripsy and drug-coated balloon angioplasty with excellent result.   She was seen again in the office 04/2021 in the setting non- healing wound of the left great toe. She was following with podiatry as well.   Presented to the ED on 2/25 with complaints of worsening nonhealing ulcer of her left great toe.  She has been following Dr. March Rummage at [facility] but noted increased darkening and discoloration with purulent drainage.   In the ED her labs showed sodium 135, potassium 3.4, Cr 3.85, lactic acid 1.4>>1.3, WBC 13.6, Hgb 9.1. Left foot Xray showed 6 mm lucency to the distal phalanx of the left great toe concerning for osteomyelitis. She was started on antibiotics and seen by podiatry. ABIs  with moderate left lower extremity disease, severe disease on the left great toe. Cardiology asked to evaluate for PV angiogram.    Past Medical History:  Diagnosis Date   Arthritis    "legs, knees; not bad" (01/30/2018)   Dizziness    occasionally   ESRD (end stage renal disease) ([place])    [name]; TTS" (01/30/2018)   History of colon polyps    History of gout    takes Allopurinol daily (01/30/2018)   Hyperlipidemia    takes Pravastatin daily   Hypertension    takes Monopril daily   Joint pain    PAD (peripheral artery disease) ([place])    Peripheral neuropathy    Peripheral vascular disease ([place])    Pre-diabetes    Refusal of blood transfusions as patient is Jehovah's Witness    NO BLOOD OR BLOOD PRODUCTS. Albumin okay.    Stroke [facility]) 1990s   "mini stroke; left lower mouth a little bit twisted since" (01/30/2018)    Past Surgical History:  Procedure Laterality Date   ABDOMINAL AORTOGRAM W/LOWER EXTREMITY N/A 01/30/2018   Procedure: ABDOMINAL AORTOGRAM W/LOWER EXTREMITY;  Surgeon: Lorretta Harp, MD;  Location: [place] [facility];  Service: Cardiovascular;  Laterality: N/A;   ABDOMINAL AORTOGRAM W/LOWER EXTREMITY N/A 03/03/2020   Procedure: ABDOMINAL AORTOGRAM W/LOWER EXTREMITY;  Surgeon: Lorretta Harp, MD;  Location: [place] [facility];  Service: Cardiovascular;  Laterality: N/A;   APPENDECTOMY     BASCILIC VEIN TRANSPOSITION Right 10/03/2013  Procedure: BRACHIOCEPHALIC Arteriovenous Fistula ;  Surgeon: Mal Misty, MD;  Location: [place];  Service: Vascular;  Laterality: Right;   [place] Right 11/17/2016   Procedure: BASCILIC VEIN TRANSPOSITION- RIGHT SINGLE STAGE;  Surgeon: Conrad Hidalgo, MD;  Location: [place];  Service: Vascular;  Laterality: Right;   COLONOSCOPY     CYST EXCISION  1980's   cyst removed from lower abdomen   DILATION AND CURETTAGE OF UTERUS  1987   ESOPHAGOGASTRODUODENOSCOPY     PERIPHERAL VASCULAR BALLOON ANGIOPLASTY Right  01/30/2018   Procedure: PERIPHERAL VASCULAR BALLOON ANGIOPLASTY;  Surgeon: Lorretta Harp, MD;  Location: [place] [facility];  Service: Cardiovascular;  Laterality: Right;  superficial femoral   PERIPHERAL VASCULAR INTERVENTION  03/03/2020   Procedure: PERIPHERAL VASCULAR INTERVENTION;  Surgeon: Lorretta Harp, MD;  Location: [place] [facility];  Service: Cardiovascular;;  external iliac stent left sfa lithotripsy/ drug coated balloon   SHOULDER ARTHROSCOPY W/ ROTATOR CUFF REPAIR Right 2013   TRANSURETHRAL RESECTION OF BLADDER TUMOR N/A 07/22/2021   Procedure: TRANSURETHRAL RESECTION OF BLADDER TUMOR (TURBT);  Surgeon: Vira Agar, MD;  Location: [facility];  Service: Urology;  Laterality: N/A;   TUBAL LIGATION  1986   UPPER EXTREMITY VENOGRAPHY Bilateral 11/08/2016   Procedure: Bilateral Upper Extremity Venography;  Surgeon: Conrad [place], MD;  Location: [place] [facility];  Service: Cardiovascular;  Laterality: Bilateral;     Home Medications:  Prior to Admission medications   Medication Sig Start Date End Date Taking? Authorizing Provider  allopurinol (ZYLOPRIM) 100 MG tablet Take 100 mg by mouth daily. 05/25/13  Yes [provider]  aspirin EC 81 MG tablet Take 81 mg by mouth daily.   Yes [provider]  atorvastatin (LIPITOR) 80 MG tablet TAKE 1 TABLET BY MOUTH ONCE DAILY AT  6:00 PM Patient taking differently: Take 80 mg by mouth daily. 06/11/21  Yes Lorretta Harp, MD  calcium acetate (PHOSLO) 667 MG capsule Take 667 mg by mouth 3 (three) times daily with meals.  06/22/13  Yes [provider]  cinacalcet (SENSIPAR) 60 MG tablet Take 60 mg by mouth daily.    Yes [provider]  clopidogrel (PLAVIX) 75 MG tablet Take 75 mg by mouth daily. 08/21/21  Yes [provider]  gabapentin (NEURONTIN) 100 MG capsule Take 1 capsule by mouth every morning and 1 capsule at bedtime. PATIENT NEEDS OFFICE VISIT FOR ADDITIONAL REFILLS Patient taking  differently: Take 100-200 mg by mouth at bedtime. 05/25/13  Yes Georgiann Mccoy M, PA-C  lidocaine-prilocaine (EMLA) cream Apply 1 application topically Every Tuesday,Thursday,and Saturday with dialysis.  01/12/18  Yes [provider]  multivitamin (RENA-VIT) TABS tablet Take 1 tablet by mouth daily.   Yes [provider]  oxybutynin (DITROPAN) 5 MG tablet Take 1 tablet (5 mg total) by mouth 3 (three) times daily as needed for bladder spasms. 07/22/21  Yes Vira Agar, MD  Blood Glucose Monitoring Suppl (ACCU-CHEK AVIVA PLUS) w/Device KIT  10/29/17   [provider]  cephALEXin (KEFLEX) 500 MG capsule Take 1 capsule (500 mg total) by mouth 2 (two) times daily. Patient not taking: Reported on 08/29/2021 07/22/21   Vira Agar, MD  glucose blood test strip Use to test blood sugar daily. Dx code: 250.02. 11/06/12   Theda Sers, PA-C  Lancets MISC Use to test blood sugar daily. Dx code 23.02. 11/08/12   Collene Leyden, PA-C  phenazopyridine (PYRIDIUM) 200 MG tablet Take  1 tablet (200 mg total) by mouth 3 (three) times daily as needed for pain. Patient not taking: Reported on 08/29/2021 07/22/21 07/22/22  Vira Agar, MD    Inpatient Medications: Scheduled Meds:  atorvastatin  80 mg Oral Daily   calcium acetate  667 mg Oral TID WC   cinacalcet  60 mg Oral Q supper   gabapentin  100 mg Oral BID   vancomycin variable dose per unstable renal function (pharmacist dosing)   Does not apply See admin instructions   Continuous Infusions:  piperacillin-tazobactam (ZOSYN)  IV 100 mL/hr at 08/31/21 0511   PRN Meds:   Allergies:    Allergies  Allergen Reactions   Tylenol [Acetaminophen] Rash    Social History:   Social History   Socioeconomic History   Marital status: Widowed    Spouse name: Not on file   Number of children: Not on file   Years of education: Not on file   Highest education level: Not on file  Occupational History   Not on file  Tobacco  Use   Smoking status: Former    Packs/day: 0.25    Years: 10.00    Pack years: 2.50    Types: Cigarettes    Quit date: 82    Years since quitting: 53.1   Smokeless tobacco: Never   Tobacco comments:    "never a heavy smoker; smoked off and on"  Vaping Use   Vaping Use: Never used  Substance and Sexual Activity   Alcohol use: Not Currently    Comment: "in my younger days"   Drug use: Not Currently    Types: Marijuana    Comment: "weed in my teens"   Sexual activity: Not Currently    Birth control/protection: Post-menopausal  Other Topics Concern   Not on file  Social History Narrative   Not on file   Social Determinants of Health   Financial Resource Strain: Not on file  Food Insecurity: Not on file  Transportation Needs: Not on file  Physical Activity: Not on file  Stress: Not on file  Social Connections: Not on file  Intimate Partner Violence: Not on file    Family History:    Family History  Problem Relation Age of Onset   Depression Mother    Hypertension Mother    Depression Father    Depression Sister    Hypertension Sister      ROS:  Please see the history of present illness.   All other ROS reviewed and negative.     Physical Exam/Data:   Vitals:   08/30/21 0958 08/30/21 2102 08/31/21 0436 08/31/21 0815  BP: (!) 119/47 (!) 120/58 (!) 118/56 (!) 110/53  Pulse: 73 90 87 79  Resp: _0 Temp: 98.3 F (36.8 C) 98.4 F (36.9 C) 98 F (36.7 C) 98.1 F (36.7 C)  TempSrc: Oral Oral Oral Oral  SpO2:  99% 97% 95%  Weight:      Height:        Intake/Output Summary (Last 24 hours) at 08/31/2021 1035 Last data filed at 08/31/2021 0511 Gross per 24 hour  Intake 310 ml  Output --  Net 310 ml   Last 3 Weights 08/29/2021 07/22/2021 07/22/2021  Weight (lbs) 160 lb 167 lb 12.3 oz 163 lb 3.2 oz  Weight (kg) 72.576 kg 76.1 kg 74.027 kg     Body mass index is 27.46 kg/m.  General:  Well nourished, well developed, in no acute distress HEENT:  normal  Neck: no JVD Vascular: No carotid bruits; Distal pulses 2+ bilaterally Cardiac:  normal S1, S2; RRR; + systolic murmur  Lungs:  clear to auscultation bilaterally. Abd: soft, nontender, no hepatomegaly  Ext: discoloration of left ankle and foot. Left great toe wrapped with dressing.  Musculoskeletal:  No deformities, BUE and BLE strength normal and equal Skin: warm and dry  Neuro:  CNs 2-12 intact, no focal abnormalities noted Psych:  Normal affect   Telemetry:  Telemetry was personally reviewed and demonstrates:  SR, PVCs, brief run of NSVT   Relevant CV Studies:  PV angiogram: 02/2020  Angiographic Data:   1: Abdominal aorta-widely patent 2: Left lower extremity-30% proximal left extra iliac artery stenosis, long diffuse segmental calcified proximal and mid left SFA stenosis in the 80-90 range.  Three-vessel runoff with the anterior tibial being the dominant vessel 3: Right lower extremity-scattered mid and distal 50% SFA stenoses with two-vessel runoff.  The posterior tibial is occluded  Final Impression: Successful PTA and stenting of a iatrogenic dissection involving the left external iliac artery, shockwave angioplasty followed by [facility] of a diffusely diseased segmental proximal mid left SFA with an excellent distal pulse at the end of procedure.  Perclose was deployed successfully achieving hemostasis.]  The patient overnight, gently hydrate her and arrange for her to undergo hemodialysis tomorrow morning after which she can be discharged home.  We will get lower extremity arterial Doppler studies in our [facility] line office next week and I will see her back the week after in follow-up.  She left the lab in stable condition.   [name]. MD, [facility] 03/03/2020 12:36 PM  Echo: 11/2020  IMPRESSIONS     1. Left ventricular ejection fraction, by estimation, is 60 to 65%. The  left ventricle has normal function. The left ventricle has no regional  wall motion abnormalities.  Left ventricular diastolic parameters are  consistent with Grade I diastolic  dysfunction (impaired relaxation). The average left ventricular global  longitudinal strain is -22.9 %. The global longitudinal strain is normal.   2. Right ventricular systolic function is normal. The right ventricular  size is normal. There is normal pulmonary artery systolic pressure.   3. The mitral valve is normal in structure. Trivial mitral valve  regurgitation. No evidence of mitral stenosis.   4. The aortic valve is calcified. There is moderate calcification of the  aortic valve. There is moderate thickening of the aortic valve. Aortic  valve regurgitation is not visualized. Mild aortic valve stenosis. Aortic  valve area, by VTI measures 1.56  cm. Aortic valve mean gradient measures 11.0 mmHg. Aortic valve Vmax  measures 2.23 m/s.   5. The inferior vena cava is normal in size with greater than 50%  respiratory variability, suggesting right atrial pressure of 3 mmHg.   FINDINGS   Left Ventricle: Left ventricular ejection fraction, by estimation, is 60  to 65%. The left ventricle has normal function. The left ventricle has no  regional wall motion abnormalities. The average left ventricular global  longitudinal strain is -22.9 %.  The global longitudinal strain is normal. The left ventricular internal  cavity size was normal in size. There is no left ventricular hypertrophy.  Left ventricular diastolic parameters are consistent with Grade I  diastolic dysfunction (impaired  relaxation).   Right Ventricle: The right ventricular size is normal. No increase in  right ventricular wall thickness. Right ventricular systolic function is  normal. There is normal pulmonary artery systolic pressure. The tricuspid  regurgitant velocity  is 2.44 m/s, and   with an assumed right atrial pressure of 3 mmHg, the estimated right  ventricular systolic pressure is 16.9 mmHg.   Left Atrium: Left atrial size was normal  in size.   Right Atrium: Right atrial size was normal in size.   Pericardium: There is no evidence of pericardial effusion.   Mitral Valve: The mitral valve is normal in structure. Trivial mitral  valve regurgitation. No evidence of mitral valve stenosis.   Tricuspid Valve: The tricuspid valve is normal in structure. Tricuspid  valve regurgitation is not demonstrated. No evidence of tricuspid  stenosis.   Aortic Valve: The aortic valve is calcified. There is moderate  calcification of the aortic valve. There is moderate thickening of the  aortic valve. Aortic valve regurgitation is not visualized. Mild aortic  stenosis is present. Aortic valve mean gradient  measures 11.0 mmHg. Aortic valve peak gradient measures 19.9 mmHg. Aortic  valve area, by VTI measures 1.56 cm.   Pulmonic Valve: The pulmonic valve was normal in structure. Pulmonic valve  regurgitation is not visualized. No evidence of pulmonic stenosis.   Aorta: The aortic root is normal in size and structure.   Venous: The inferior vena cava is normal in size with greater than 50%  respiratory variability, suggesting right atrial pressure of 3 mmHg.   IAS/Shunts: No atrial level shunt detected by color flow Doppler.   Laboratory Data:  High Sensitivity Troponin:  No results for input(s): TROPONINIHS in the last 720 hours.   Chemistry Recent Labs  Lab 08/29/21 1719 08/30/21 0023 08/31/21 0324  NA 135 138 138  K 3.4* 3.6 4.0  CL 91* 94* 96*  CO2 34* 32 29  GLUCOSE 135* 112* 145*  BUN 18 19 39*  CREATININE 3.85* 4.01* 6.72*  CALCIUM 8.2* 8.0* 7.6*  GFRNONAA 12* 11* 6*  ANIONGAP _0 Recent Labs  Lab 08/29/21 1719  PROT 7.7  ALBUMIN 2.6*  AST 34  ALT 23  ALKPHOS 94  BILITOT 0.5   Lipids No results for input(s): CHOL, TRIG, HDL, LABVLDL, LDLCALC, CHOLHDL in the last 168 hours.  Hematology Recent Labs  Lab 08/29/21 1719 08/30/21 0023 08/31/21 0324  WBC 13.6* 12.5* 13.4*  RBC 3.09* 2.90*  2.72*  HGB 9.1* 8.4* 7.9*  HCT 29.6* 28.1* 25.9*  MCV 95.8 96.9 95.2  MCH 29.4 29.0 29.0  MCHC 30.7 29.9* 30.5  RDW 15.7* 15.6* 15.5  PLT 380 368 344   Thyroid No results for input(s): TSH, FREET4 in the last 168 hours.  BNPNo results for input(s): BNP, PROBNP in the last 168 hours.  DDimer No results for input(s): DDIMER in the last 168 hours.   Radiology/Studies:  MR FOOT LEFT WO CONTRAST  Result Date: 08/30/2021 CLINICAL DATA:  Foot swelling, diabetic, osteomyelitis suspected, xray donePatient reports toes on left foot began bothering her in December, left lower leg, ankle swelling/red. Patient went to UC today and was told she has gangrene, foul odor. Pain rated 4/10 EXAM: MRI OF THE LEFT FOOT WITHOUT CONTRAST TECHNIQUE: Multiplanar, multisequence MR imaging of the left foot was performed. No intravenous contrast was administered. COMPARISON:  Left foot radiographs, 08/29/2021 and 04/20/2017. FINDINGS: Bones/Joint/Cartilage The distal phalanx of the great toe is truncated, which may be from resorption, prior surgery or a combination. There is abnormal signal from the great toe distal phalanx, decreased in T1 and hyperintense on T2, with portions of the low signal intensity cortical line disrupted, predominately along the lateral  margin. A small focus of decreased T1 signal is seen in the subchondral bone along the lateral margin of the head of the proximal phalanx of the great toe, without overlying cortical resorption, felt to be degenerative in origin. There is more diffuse increased T2 signal in the distal aspect of the great toe proximal phalanx. There are no other areas of abnormal bone marrow signal. No joint effusion. Ligaments Intact. Muscles and Tendons Increased T2 signal noted throughout the intrinsic foot musculature. Tendons normal in signal and thickness. Soft tissues Diffuse soft tissue edema, most evident involving the great toe. No fluid collections to suggest an abscess.  IMPRESSION: 1. Abnormal signal from the remaining distal phalanx of the great toe consistent with osteomyelitis. 2. No other evidence of osteomyelitis. Increased T2 signal within the distal aspect of the proximal phalanx of the great toe, consistent with reactive edema. 3. Great toe soft tissue edema with a likely component of cellulitis. No evidence of an abscess. 4. More diffuse soft tissue and intrinsic foot muscle edema. Electronically Signed   By: Lajean Manes M.D.   On: 08/30/2021 10:08   DG Foot Complete Left  Result Date: 08/29/2021 CLINICAL DATA:  Pain and swelling EXAM: LEFT FOOT - COMPLETE 3+ VIEW COMPARISON:  04/20/2017 FINDINGS: No recent fracture or dislocation is seen. There is possible previous partial resection of distal portion of distal phalanx of big toe. There is small radiolucency in the proximal shaft of distal phalanx of left big toe. Rest of the bony structures are unremarkable. Arterial calcifications are seen in the soft tissues. Bony spurs seen in the tarsometatarsal joints. Small plantar spur is seen in the calcaneus. Calcific bursitis/tendinosis is seen in the Achilles tendon close to calcaneus. IMPRESSION: There is 6 mm faint radiolucency in the remaining base of distal phalanx of left big toe. Possibility of osteomyelitis is not excluded. Follow-up MRI as clinically warranted should be considered. No recent fracture or dislocation is seen. Other findings as described in the body of the report. Electronically Signed   By: Elmer Picker M.D.   On: 08/29/2021 17:37   VAS [place] ABI WITH/WO TBI  Result Date: 08/30/2021  LOWER EXTREMITY DOPPLER STUDY Patient Name:  Joanne Carlson  Date of Exam:   08/30/2021 Medical Rec #: [id number]         Accession #:    [id number] Date of Birth: Jul 15, 1945          Patient Gender: F Patient Age:   51 years Exam Location:  [facility] Procedure:      VAS [place] ABI WITH/WO TBI Referring Phys: CHING TU  --------------------------------------------------------------------------------  Indications: Ulceration. High Risk Factors: Hypertension, hyperlipidemia, Diabetes, past history of                    smoking, prior CVA. Other Factors: ESRD(HD), PAD.  Vascular Interventions: LLE angioplasty & stent placement (2021), RLE                         angioplasty & atherectomy (2019). Comparison Study: Previous exam 04/06/21 Performing Technologist: Rogelia Rohrer RVT, RDMS  Examination Guidelines: A complete evaluation includes at minimum, Doppler waveform signals and systolic blood pressure reading at the level of bilateral brachial, anterior tibial, and posterior tibial arteries, when vessel segments are accessible. Bilateral testing is considered an integral part of a complete examination. Photoelectric Plethysmograph (PPG) waveforms and toe systolic pressure readings are included as required and additional duplex testing  as needed. Limited examinations for reoccurring indications may be performed as noted.  ABI Findings: +---------+------------------+-----+----------+---------+  Right     Rt Pressure (mmHg) Index Waveform   Comment    +---------+------------------+-----+----------+---------+  Brachial                                      HD access  +---------+------------------+-----+----------+---------+  PTA       102                0.87  monophasic            +---------+------------------+-----+----------+---------+  DP        111                0.95  biphasic              +---------+------------------+-----+----------+---------+  Great Toe 165                1.41  Abnormal              +---------+------------------+-----+----------+---------+ +---------+------------------+-----+----------+-------+  Left      Lt Pressure (mmHg) Index Waveform   Comment  +---------+------------------+-----+----------+-------+  Brachial  117                      triphasic           +---------+------------------+-----+----------+-------+   PTA       65                 0.56  monophasic          +---------+------------------+-----+----------+-------+  DP        73                 0.62  monophasic          +---------+------------------+-----+----------+-------+  Great Toe 25                 0.21  Abnormal            +---------+------------------+-----+----------+-------+ +-------+-----------+-----------+------------+------------+  ABI/TBI Today's ABI Today's TBI Previous ABI Previous TBI  +-------+-----------+-----------+------------+------------+  Right   0.95        1.41        0.94         1.18          +-------+-----------+-----------+------------+------------+  Left    0.62        0.21        [place]           0.60          +-------+-----------+-----------+------------+------------+   Summary: Right: Resting right ankle-brachial index indicates mild right lower extremity arterial disease. The right toe-brachial index is abnormal (falsely elevated - likely due to vessel calcification). Left: Resting left ankle-brachial index indicates moderate left lower extremity arterial disease. The left toe-brachial index is abnormal (severe).  *See table(s) above for measurements and observations.     Preliminary      Assessment and Plan:   Joanne Carlson is a 76 y.o. female with a hx of PVD, ESRD on HD, DM, HLD, HTN bladder CA s/p TURBT and mild AS who is being seen 08/31/2021 for the evaluation of osteomyelitis, PVD  at the request of Dr. Eliseo Squires.  Osteomyelitis/PVD: does have known hx of PVD with previous interventions. ABIs with severe disease of the left great toe and non-healing gangrenous area. Have discussed proceeding  with PV angiogram with the patient and she is agreeable to proceed. Has been NPO this morning. Dr. Gwenlyn Found is in the cath lab today and will see patient.  -- keep NPO -- antibiotics per primary   ESRD on HD: on TTS schedule -- planned for HD tomorrow   Bladder Tumor: s/p TURBT on 07/22/2021  For questions or updates, please contact  [place] Please consult [email or web address] for contact info under    Signed, Reino Bellis, NP  08/31/2021 10:35 AM   Agree with note by Reino Bellis NP-C  Joanne Carlson is well-known to me for long history of PAD.  I have intervened on both the right and left SFA for critical ischemia resulting in healing of her wounds.  Her last angiogram performed by myself 03/03/2020 was remarkable for stenting of the left extrailiac artery, shockwave angioplasty and drug-coated balloon angioplasty of the left SFA.  She did have three-vessel runoff.  I saw her in the office earlier this year with a left toe wound that was slow to heal being followed by her podiatrist.  She was admitted with critical ischemia and osteo.  Her other medical problems are stable.  She is scheduled for hemodialysis tomorrow.  I agree with PV angio to define her anatomy and optimize her ability to heal.  Lorretta Harp, M.D., FACP, [facility], [place], [address] [place], [place]  [id number]  [phone] 08/31/2021 1:42 PM

## 2021-08-31 NOTE — Progress Notes (Addendum)
°  Progress Note   Patient: Joanne Carlson JOI:[id number] DOB: 06-23-1946 DOA: 08/29/2021     2 DOS: the patient was seen and examined on 08/31/2021   Brief hospital course: Joanne Carlson is a 76 y.o. female with medical history significant of PAD s/p DES bilaterally on aspirin and plavix, ESRD HD TTS, Type 2 DM, bladder cancer s/p TURBT, HLD, iron deficiency anemia who presents with worsening non-healing ulcer of left great toe.  MRI done and shows osteomyelitis.  Podiatry consulted/cardiology (vascular) consulted/nephrology consulted  Assessment and Plan: * Osteomyelitis (Joanne Carlson)- (present on admission) -Has known PAD followed by cardiology Dr. Alvester Chou. Last peripheral angiogram on 03/03/2020 revealing minimal disease in the mid and distal right SFA in the 50% range, patent left external iliac artery stents with high-grade segmental calcified proximal mid left SFA stenosis.  She underwent shockwave intravascular lithotripsy and drug-coated balloon angioplasty with good results. - left LE ABI done - X-ray of the foot with lucency at the base of the distal phalanx concerning for osteomyelitis- MRI confirms -podiatry consult: -We will plan on doing partial hallux amputation to the left side on Wednesday. -Dr. Gwenlyn Found consult incase re-vascularization needed -continue IV abx    PVD (peripheral vascular disease) ([place]) -History of bilateral SFA stent and follows with cardiology Dr. Alvester Chou -Hold aspirin and Plavix until ok with podiatry  Bladder tumor- (present on admission) - S/p TURBT on 07/22/2021  End stage renal disease ([place])- (present on admission) -had HD saturday.  Creatinine stable at 3.85.  nephrology consult for routine HD due 2/28  Type 2 diabetes mellitus without complication, with long-term current use of insulin ([place]) Controlled.  Hemoglobin A1c of 5.7 in 07/2021      Subjective: in bathroom  Physical Exam: Vitals:   08/30/21 0958 08/30/21 2102 08/31/21 0436 08/31/21 0815  BP:  (!) 119/47 (!) 120/58 (!) 118/56 (!) 110/53  Pulse: 73 90 87 79  Resp: 18 18 20 18   Temp: 98.3 F (36.8 C) 98.4 F (36.9 C) 98 F (36.7 C) 98.1 F (36.7 C)  TempSrc: Oral Oral Oral Oral  SpO2:  99% 97% 95%  Weight:      Height:        In bathroom, NAD     Data Reviewed: WBC trending up, CR up but pt is ESRD-- K is WNL    Disposition: Status is: Inpatient Remains inpatient appropriate because: needs cards/podiatry intervention          Planned Discharge Destination:  tbd   Time spent: 45 minutes  Author: Geradine Girt, DO 08/31/2021 12:46 PM  For on call review [email or web address].

## 2021-08-31 NOTE — Progress Notes (Addendum)
Pt request RN to check CBG, done, and resulted at 51, PO apple juice given, will recheck, safety maintained

## 2021-08-31 NOTE — Progress Notes (Signed)
CBG rechecked and resulted at 75, apple juice given again, pt instructed to inform RN if not feeling well, safety maintained

## 2021-09-01 ENCOUNTER — Encounter (HOSPITAL_COMMUNITY): Payer: Self-pay | Admitting: Cardiovascular Disease

## 2021-09-01 DIAGNOSIS — E119 Type 2 diabetes mellitus without complications: Secondary | ICD-10-CM | POA: Diagnosis not present

## 2021-09-01 DIAGNOSIS — E1122 Type 2 diabetes mellitus with diabetic chronic kidney disease: Secondary | ICD-10-CM | POA: Diagnosis not present

## 2021-09-01 DIAGNOSIS — N186 End stage renal disease: Secondary | ICD-10-CM | POA: Diagnosis not present

## 2021-09-01 DIAGNOSIS — Z992 Dependence on renal dialysis: Secondary | ICD-10-CM | POA: Diagnosis not present

## 2021-09-01 DIAGNOSIS — M86672 Other chronic osteomyelitis, left ankle and foot: Secondary | ICD-10-CM | POA: Diagnosis not present

## 2021-09-01 DIAGNOSIS — Z794 Long term (current) use of insulin: Secondary | ICD-10-CM | POA: Diagnosis not present

## 2021-09-01 DIAGNOSIS — I739 Peripheral vascular disease, unspecified: Secondary | ICD-10-CM | POA: Diagnosis not present

## 2021-09-01 LAB — CBC
HCT: 25.8 % — ABNORMAL LOW (ref 36.0–46.0)
Hemoglobin: 8.1 g/dL — ABNORMAL LOW (ref 12.0–15.0)
MCH: 29.3 pg (ref 26.0–34.0)
MCHC: 31.4 g/dL (ref 30.0–36.0)
MCV: 93.5 fL (ref 80.0–100.0)
Platelets: 356 10*3/uL (ref 150–400)
RBC: 2.76 MIL/uL — ABNORMAL LOW (ref 3.87–5.11)
RDW: 15.7 % — ABNORMAL HIGH (ref 11.5–15.5)
WBC: 12.7 10*3/uL — ABNORMAL HIGH (ref 4.0–10.5)
nRBC: 0 % (ref 0.0–0.2)

## 2021-09-01 LAB — GLUCOSE, CAPILLARY: Glucose-Capillary: 143 mg/dL — ABNORMAL HIGH (ref 70–99)

## 2021-09-01 LAB — LIPID PANEL
Cholesterol: 77 mg/dL (ref 0–200)
HDL: 17 mg/dL — ABNORMAL LOW (ref >40)
LDL Cholesterol: 39 mg/dL (ref 0–99)
Total CHOL/HDL Ratio: 4.5 RATIO
Triglycerides: 105 mg/dL (ref <150)
VLDL: 21 mg/dL (ref 0–40)

## 2021-09-01 LAB — BASIC METABOLIC PANEL
Anion gap: 17 — ABNORMAL HIGH (ref 5–15)
BUN: 46 mg/dL — ABNORMAL HIGH (ref 8–23)
CO2: 26 mmol/L (ref 22–32)
Calcium: 7.8 mg/dL — ABNORMAL LOW (ref 8.9–10.3)
Chloride: 92 mmol/L — ABNORMAL LOW (ref 98–111)
Creatinine, Ser: 8.02 mg/dL — ABNORMAL HIGH (ref 0.44–1.00)
GFR, Estimated: 5 mL/min — ABNORMAL LOW (ref >60)
Glucose, Bld: 130 mg/dL — ABNORMAL HIGH (ref 70–99)
Potassium: 4.2 mmol/L (ref 3.5–5.1)
Sodium: 135 mmol/L (ref 135–145)

## 2021-09-01 LAB — HEPATITIS B SURFACE ANTIBODY,QUALITATIVE: Hep B S Ab: REACTIVE — AB

## 2021-09-01 LAB — PHOSPHORUS: Phosphorus: 4.1 mg/dL (ref 2.5–4.6)

## 2021-09-01 LAB — HEPATITIS B SURFACE ANTIGEN
Hepatitis B Surface Ag: NONREACTIVE
Hepatitis B Surface Ag: NONREACTIVE

## 2021-09-01 MED ORDER — VANCOMYCIN HCL 750 MG/150ML IV SOLN
750.0000 mg | INTRAVENOUS | Status: DC
  Administered 2021-09-01 – 2021-09-03 (×2): 750 mg via INTRAVENOUS
  Filled 2021-09-01 (×4): qty 150

## 2021-09-01 MED ORDER — PIPERACILLIN-TAZOBACTAM IN DEX 2-0.25 GM/50ML IV SOLN
2.2500 g | Freq: Three times a day (TID) | INTRAVENOUS | Status: DC
  Administered 2021-09-01 – 2021-09-02 (×3): 2.25 g via INTRAVENOUS
  Filled 2021-09-01 (×4): qty 50

## 2021-09-01 NOTE — Progress Notes (Signed)
°  Progress Note   Patient: Joanne Carlson [id number]:[id number] DOB: 01-09-1946 DOA: 08/29/2021     3 DOS: the patient was seen and examined on 09/01/2021   Brief hospital course: Joanne Carlson is a 76 y.o. female with medical history significant of PAD s/p DES bilaterally on aspirin and plavix, ESRD HD TTS, Type 2 DM, bladder cancer s/p TURBT, HLD, iron deficiency anemia who presents with worsening non-healing ulcer of left great toe.  MRI done and shows osteomyelitis.  Podiatry consulted/cardiology (vascular) consulted/nephrology consulted.  Plan for partial hallus amputation in aM  Assessment and Plan: * Osteomyelitis (Joanne Carlson)- (present on admission) -Has known PAD followed by cardiology Joanne Carlson. Last peripheral angiogram on 03/03/2020 revealing minimal disease in the mid and distal right SFA in the 50% range, patent left external iliac artery stents with high-grade segmental calcified proximal mid left SFA stenosis.  She underwent shockwave intravascular lithotripsy and drug-coated balloon angioplasty with good results. - left LE ABI done - X-ray of the foot with lucency at the base of the distal phalanx concerning for osteomyelitis- MRI confirms -podiatry consult:  - plan on doing partial hallux amputation to the left side on Wednesday. -continue IV abx -Joanne Carlson: s/p Successful Cutting Balloon angioplasty followed by [facility] of 99% P2 segment left popliteal artery stenosis    PVD (peripheral vascular disease) ([name]) -History of bilateral SFA stent and follows with cardiology Joanne Carlson -- Continue DAPT with aspirin and Plavix  Bladder tumor- (present on admission) - S/p TURBT on 07/22/2021  End stage renal disease ([name])- (present on admission) -had HD saturday.  Creatinine stable at 3.85.  nephrology consult for routine HD due 2/28  Type 2 diabetes mellitus without complication, with long-term current use of insulin ([name]) Controlled.  Hemoglobin A1c of 5.7 in 07/2021       Subjective: in  HD, sleeping  Physical Exam: Vitals:   09/01/21 1100 09/01/21 1130 09/01/21 1209 09/01/21 1219  BP: (!) 130/51 112/88 120/69 96/60  Pulse: 71 79 74 77  Resp: 17 17 16 20   Temp:      TempSrc:      SpO2:      Weight:      Height:        General: Appearance:     Overweight female in no acute distress- sleeping on HD     Lungs:      respirations unlabored              Data Reviewed: WBC elevated at 12.7 Hgb 8.1    Disposition: Status is: Inpatient Remains inpatient appropriate because: needs procedure in AM          Planned Discharge Destination:  tbd    Time spent: 75 minutes  Author: Geradine Girt, DO 09/01/2021 2:34 PM  For on call review [email or web address].

## 2021-09-01 NOTE — Care Management Important Message (Signed)
Important Message  Patient Details  Name: LAURIAN EDRINGTON MRN: [id number] Date of Birth: Oct 07, 1945   Medicare Important Message Given:  Yes     Shelda Altes 09/01/2021, 8:49 AM

## 2021-09-01 NOTE — Progress Notes (Signed)
Pharmacy Antibiotic Note  Joanne Carlson is a 76 y.o. female admitted on 08/29/2021 with  osteomyelitis .  Pharmacy has been consulted for vanc/zosyn dosing. Plans are noted for partial hallux amputation to the left side on Wednesday. She is also noted with ESRD on HD TTS.  -cultures- ngtd, WBC= 12.7, afebrile   Plan: -Vancomycin 750 mg IV TTS -Zosyn 2.25gm IV q8h -Will follow cultures and clinical progress     Height: 5\' 4"  (162.6 cm) Weight: 72.6 kg (160 lb) IBW/kg (Calculated) : 54.7  Temp (24hrs), Avg:98.6 F (37 C), Min:98.1 F (36.7 C), Max:99.5 F (37.5 C)  Recent Labs  Lab 08/29/21 1719 08/29/21 2230 08/30/21 0023 08/31/21 0324 09/01/21 0209  WBC 13.6*  --  12.5* 13.4* 12.7*  CREATININE 3.85*  --  4.01* 6.72* 8.02*  LATICACIDVEN 1.4 1.3  --   --   --      Estimated Creatinine Clearance: 5.8 mL/min (A) (by C-G formula based on SCr of 8.02 mg/dL (H)).    Allergies  Allergen Reactions   Tylenol [Acetaminophen] Rash     Thank you for allowing pharmacy to be a part of this patients care.  Hildred Laser, PharmD Clinical Pharmacist **Pharmacist phone directory can now be found on [email or web address] ([email or web address]).  Listed under [place].

## 2021-09-01 NOTE — Progress Notes (Addendum)
Paged Dr. Eliseo Squires and advised that pt is having a significant amount of blood when she urinates. Pt reports that this is normal for her, but advised provider due to restarting Plavix. Dr. Eliseo Squires advised that Plavix is a home med and with her recent s/p bladder surgery this is probably suspected from that. Will continue to monitor.

## 2021-09-01 NOTE — Progress Notes (Signed)
°  [facility] Progress Note   Assessment/ Plan:   Dialysis Orders:  TTS - [facility]  3hrs48min, BFR 350, DFR 600,  EDW 73.7kg, 2K/ 2.5Ca   Heparin 7000 unit bolus with HD Mircera 75 mcg q2wks - last 08/20/21 Hectorol 52mcg IV qHD- last 08/29/21 Venofer 100mg  IV X 5 doses-last dose 08/29/21: Needs 3 more doses Home meds: Midodrine 10mg  pre-HD; Sensipar 60mg  daily   Assessment/Plan: Osteomyelitis L great toe: On ABXs; Podiatry and Cardiology following; s/p balloon cutting angioplasty L popliteal artery 08/31/21, partial hallux amputation on Wed 09/02/21.  PVD: History of bilateral SFA stent; ASA and Plavix held for now; as above in #1- had L leg critical limb ischemia ESRD - on HD TTS; plan for HD tomorrow per usual schedule Hypertension/volume  - Normotensive and euvolemic on exam Anemia of CKD - Hgb 7.9; will resume short Fe load (ordered from outpatient setting); ESA recently given on 08/29/21 Secondary Hyperparathyroidism -  Will check PO4 in AM; continue Sensipar for now Nutrition renal diet/ vits Dispo: inpatient  Subjective:    Seen and examined on HD. Qb 400 mL /min via AVF, UF ogal 2.5L.  Tolerating treatment well.  No issues.  Got aortogram yesterday, had balloon cutting angioplasty of L popliteal artery d/t critical limb ischemia.     Objective:   BP (!) 123/49    Pulse 76    Temp 98.2 F (36.8 C) (Temporal)    Resp 16    Ht 5\' 4"  (1.626 m)    Wt 76.3 kg    SpO2 100%    BMI 28.87 kg/m   Physical Exam: Gen:NAD, sitting in bed CVS: RRR Resp: clear Abd: soft Ext: no LE edema, L great toe with bandaid ACCESS: AVF   Labs: BMET Recent Labs  Lab 08/29/21 1719 08/30/21 0023 08/31/21 0324 09/01/21 0209  NA 135 138 138 135  K 3.4* 3.6 4.0 4.2  CL 91* 94* 96* 92*  CO2 34* 32 29 26  GLUCOSE 135* 112* 145* 130*  BUN 18 19 39* 46*  CREATININE 3.85* 4.01* 6.72* 8.02*  CALCIUM 8.2* 8.0* 7.6* 7.8*  PHOS  --   --   --  4.1   CBC Recent Labs   Lab 08/29/21 1719 08/30/21 0023 08/31/21 0324 09/01/21 0209  WBC 13.6* 12.5* 13.4* 12.7*  NEUTROABS 10.6*  --   --   --   HGB 9.1* 8.4* 7.9* 8.1*  HCT 29.6* 28.1* 25.9* 25.8*  MCV 95.8 96.9 95.2 93.5  PLT 380 368 344 356      Medications:     aspirin EC  81 mg Oral Daily   atorvastatin  80 mg Oral Daily   calcium acetate  667 mg Oral TID WC   cinacalcet  60 mg Oral Q supper   clopidogrel  75 mg Oral Q breakfast   doxercalciferol  2 mcg Intravenous Q T,Th,Sa-HD   gabapentin  100 mg Oral BID   sodium chloride flush  3 mL Intravenous Q12H   vancomycin variable dose per unstable renal function (pharmacist dosing)   Does not apply See admin instructions     Madelon Lips MD 09/01/2021, 10:39 AM

## 2021-09-01 NOTE — Progress Notes (Addendum)
Progress Note  Patient Name: Joanne Carlson Date of Encounter: 09/01/2021  [facility] Cardiologist: [name], MD   Subjective   Feeling well. No chest pain, sob or palpitations.    Inpatient Medications    Scheduled Meds:  aspirin EC  81 mg Oral Daily   atorvastatin  80 mg Oral Daily   calcium acetate  667 mg Oral TID WC   cinacalcet  60 mg Oral Q supper   clopidogrel  75 mg Oral Q breakfast   doxercalciferol  2 mcg Intravenous Q T,Th,Sa-HD   gabapentin  100 mg Oral BID   sodium chloride flush  3 mL Intravenous Q12H   vancomycin variable dose per unstable renal function (pharmacist dosing)   Does not apply See admin instructions   Continuous Infusions:  sodium chloride     sodium chloride     sodium chloride     ferric gluconate (FERRLECIT) IVPB     piperacillin-tazobactam (ZOSYN)  IV 2.25 g (08/31/21 2338)   vancomycin     PRN Meds: sodium chloride, sodium chloride, sodium chloride, acetaminophen, alteplase, heparin, hydrALAZINE, labetalol, lidocaine (PF), lidocaine-prilocaine, morphine injection, ondansetron **OR** ondansetron (ZOFRAN) IV, ondansetron (ZOFRAN) IV, pentafluoroprop-tetrafluoroeth, senna-docusate, sodium chloride flush   Vital Signs    Vitals:   08/31/21 2140 08/31/21 2145 09/01/21 0007 09/01/21 0354  BP: (!) 114/51  (!) 129/48 (!) 127/47  Pulse: 75 75 81 78  Resp: 18 20 19 16   Temp:   98.2 F (36.8 C) 99.5 F (37.5 C)  TempSrc:   Oral Axillary  SpO2: 100% 99% 100% 100%  Weight:      Height:       No intake or output data in the 24 hours ending 09/01/21 0748 Last 3 Weights 08/29/2021 07/22/2021 07/22/2021  Weight (lbs) 160 lb 167 lb 12.3 oz 163 lb 3.2 oz  Weight (kg) 72.576 kg 76.1 kg 74.027 kg      Telemetry    NSR - Personally Reviewed  ECG    N/A  Physical Exam   GEN: No acute distress.   Neck: No JVD Cardiac: RRR, systolic murmurs, rubs, or gallops.  Respiratory: Clear to auscultation bilaterally. GI: Soft,  nontender, non-distended  MS: No edema; No deformity. Neuro:  Nonfocal  Psych: Normal affect   Labs    Chemistry Recent Labs  Lab 08/29/21 1719 08/30/21 0023 08/31/21 0324 09/01/21 0209  NA 135 138 138 135  K 3.4* 3.6 4.0 4.2  CL 91* 94* 96* 92*  CO2 34* 32 29 26  GLUCOSE 135* 112* 145* 130*  BUN 18 19 39* 46*  CREATININE 3.85* 4.01* 6.72* 8.02*  CALCIUM 8.2* 8.0* 7.6* 7.8*  PROT 7.7  --   --   --   ALBUMIN 2.6*  --   --   --   AST 34  --   --   --   ALT 23  --   --   --   ALKPHOS 94  --   --   --   BILITOT 0.5  --   --   --   GFRNONAA 12* 11* 6* 5*  ANIONGAP 10 12 13  17*    Lipids  Recent Labs  Lab 09/01/21 0209  CHOL 77  TRIG 105  HDL 17*  LDLCALC 39  CHOLHDL 4.5    Hematology Recent Labs  Lab 08/30/21 0023 08/31/21 0324 09/01/21 0209  WBC 12.5* 13.4* 12.7*  RBC 2.90* 2.72* 2.76*  HGB 8.4* 7.9* 8.1*  HCT 28.1*  25.9* 25.8*  MCV 96.9 95.2 93.5  MCH 29.0 29.0 29.3  MCHC 29.9* 30.5 31.4  RDW 15.6* 15.5 15.7*  PLT 368 344 356    Radiology    MR FOOT LEFT WO CONTRAST  Result Date: 08/30/2021 CLINICAL DATA:  Foot swelling, diabetic, osteomyelitis suspected, xray donePatient reports toes on left foot began bothering her in December, left lower leg, ankle swelling/red. Patient went to UC today and was told she has gangrene, foul odor. Pain rated 4/10 EXAM: MRI OF THE LEFT FOOT WITHOUT CONTRAST TECHNIQUE: Multiplanar, multisequence MR imaging of the left foot was performed. No intravenous contrast was administered. COMPARISON:  Left foot radiographs, 08/29/2021 and 04/20/2017. FINDINGS: Bones/Joint/Cartilage The distal phalanx of the great toe is truncated, which may be from resorption, prior surgery or a combination. There is abnormal signal from the great toe distal phalanx, decreased in T1 and hyperintense on T2, with portions of the low signal intensity cortical line disrupted, predominately along the lateral margin. A small focus of decreased T1 signal is  seen in the subchondral bone along the lateral margin of the head of the proximal phalanx of the great toe, without overlying cortical resorption, felt to be degenerative in origin. There is more diffuse increased T2 signal in the distal aspect of the great toe proximal phalanx. There are no other areas of abnormal bone marrow signal. No joint effusion. Ligaments Intact. Muscles and Tendons Increased T2 signal noted throughout the intrinsic foot musculature. Tendons normal in signal and thickness. Soft tissues Diffuse soft tissue edema, most evident involving the great toe. No fluid collections to suggest an abscess. IMPRESSION: 1. Abnormal signal from the remaining distal phalanx of the great toe consistent with osteomyelitis. 2. No other evidence of osteomyelitis. Increased T2 signal within the distal aspect of the proximal phalanx of the great toe, consistent with reactive edema. 3. Great toe soft tissue edema with a likely component of cellulitis. No evidence of an abscess. 4. More diffuse soft tissue and intrinsic foot muscle edema. Electronically Signed   By: Lajean Manes M.D.   On: 08/30/2021 10:08   PERIPHERAL VASCULAR CATHETERIZATION  Result Date: 08/31/2021 Images from the original result were not included.  [id number] LOCATION:  FACILITY: [facility] PHYSICIAN: [name], M.D. 10-31-1945 DATE OF PROCEDURE:  08/31/2021 DATE OF DISCHARGE: PV Angiogram/Intervention History obtained from chart review.Joanne Carlson is a 76 y.o. female with a hx of PVD, ESRD on HD, DM, HLD, HTN, bladder CA s/p TURBT and mild AS who is being seen 08/31/2021 for the evaluation of osteomyelitis, PVD  at the request of Dr. Eliseo Squires.  I have performed right and left SFA intervention in the past for critical ischemia resulting in wound healing.  She was admitted with a nonhealing wound of her left great toe with evidence of osteomyelitis.  She is on hemodialysis scheduled for HD tomorrow.  She presents now for peripheral angiography  potential intervention for limb salvage. Pre Procedure Diagnosis: Critical ischemia/PAD Post Procedure Diagnosis: Critical ischemia/PAD Operators: Dr. [name] Procedures Performed:  1.  Ultrasound-guided right common femoral access  2.  Abdominal aortogram/bilateral iliac angiogram  3.  Contralateral access (second-order catheter placement)  4.  Left lower extremity angiography with runoff             5.  Score flex Cutting Balloon angioplasty followed by DCB left P2 segment of popliteal artery PROCEDURE DESCRIPTION: The patient was brought to the second floor [facility] cath lab in the the postabsorptive state. She  was premedicated with IV Versed and fentanyl. Her right groin was prepped and shaved in usual sterile fashion. Xylocaine 1% was used for local anesthesia. A 5 French sheath was inserted into the right common femoral artery using standard Seldinger technique.  Ultrasound was used to identify the right common femoral artery and guide access.  A digital copy was captured and placed the patient's chart.  A 5 French pigtail catheter was placed in the distal abdominal aorta.  Distal abdominal aortography, bilateral iliac angiography was performed.  Contralateral access was obtained with a 5 [place] crossover catheter and endhole catheter (second-order catheter placement).  Left lower extremity angiography with runoff was performed using bolus chase, digital subtraction and step table technique.  Omnipaque dye was used for the entirety of the case (150 cc total contrast the patient).  Retrograde ordered pressures monitored during the case.  Angiographic Data: 1: Abdominal aorta-widely patent 2: Left lower extremity-left extrailiac artery stent was widely patent.  There were scattered 50 to 70% calcified focal stenoses in the proximal, mid and distal left SFA.  There was a 99% left P2 popliteal segment stenosis with three-vessel runoff. 3: Right lower extremity-the right common, external iliac and  common femoral arteries widely patent   Ms. Cochrane has critical limb ischemia with scattered calcified focal stenoses in the proximal, mid and distal left SFA as well as a 99% P2 segment left popliteal artery stenosis probably accounting for her diminished infrapopliteal blood flow and decreased left ABI.  We will proceed with Cutting Balloon angioplasty followed by [facility] Procedure Description: The endhole catheter was exchanged over a Rosen wire for a 6 [place] 55 cm multipurpose Ansell sheath.  Patient received 11,000 units of heparin with an ACT of 299.  Omnipaque dye was used for the entirety of the intervention.  Retrograde pressures monitored during the case. I navigated the left SFA with a 6 g 014 Shepperd wire I was able to cross the popliteal lesion and accessed the anterior tibial artery.  I then performed Cutting Balloon angioplasty with a 3.5 mm x 20 mm long score flex Cutting Balloon at nominal pressures for 2 minutes.  I follow this with a 5 mm x 4 cm Ranger DCB at 4 atm for 3 minutes resulting in reduction of a 99% P2 segment left popliteal artery stenosis to 0% residual with excellent flow.  The patient tolerated the procedure well.  The wire was removed.  The Ansell sheath was removed over an 035 versa core wire and exchanged for a short 6 [place] sheath which was then secured in place.  The patient received 300 mg of p.o. clopidogrel. Final Impression: Successful Cutting Balloon angioplasty followed by DCB of 99% P2 segment left popliteal artery stenosis in the setting of critical ischemia.  The sheath will be removed once ACT falls below 170 and pressure held.  She will be treated with DAPT as well as ongoing wound care and antibiotic therapy.  She left the lab in stable condition. [name]. MD, [facility] 08/31/2021 4:15 PM    VAS [place] ABI WITH/WO TBI  Result Date: 08/30/2021  LOWER EXTREMITY DOPPLER STUDY Patient Name:  Joanne Carlson  Date of Exam:   08/30/2021 Medical Rec #: [id number]          Accession #:    [id number] Date of Birth: January 08, 1946          Patient Gender: F Patient Age:   76 years Exam Location:  [facility] Procedure:  VAS [place] ABI WITH/WO TBI Referring Phys: CHING TU --------------------------------------------------------------------------------  Indications: Ulceration. High Risk Factors: Hypertension, hyperlipidemia, Diabetes, past history of                    smoking, prior CVA. Other Factors: ESRD(HD), PAD.  Vascular Interventions: LLE angioplasty & stent placement (2021), RLE                         angioplasty & atherectomy (2019). Comparison Study: Previous exam 04/06/21 Performing Technologist: Rogelia Rohrer RVT, RDMS  Examination Guidelines: A complete evaluation includes at minimum, Doppler waveform signals and systolic blood pressure reading at the level of bilateral brachial, anterior tibial, and posterior tibial arteries, when vessel segments are accessible. Bilateral testing is considered an integral part of a complete examination. Photoelectric Plethysmograph (PPG) waveforms and toe systolic pressure readings are included as required and additional duplex testing as needed. Limited examinations for reoccurring indications may be performed as noted.  ABI Findings: +---------+------------------+-----+----------+---------+  Right     Rt Pressure (mmHg) Index Waveform   Comment    +---------+------------------+-----+----------+---------+  Brachial                                      HD access  +---------+------------------+-----+----------+---------+  PTA       102                0.87  monophasic            +---------+------------------+-----+----------+---------+  DP        111                0.95  biphasic              +---------+------------------+-----+----------+---------+  Great Toe 165                1.41  Abnormal              +---------+------------------+-----+----------+---------+ +---------+------------------+-----+----------+-------+  Left      Lt Pressure  (mmHg) Index Waveform   Comment  +---------+------------------+-----+----------+-------+  Brachial  117                      triphasic           +---------+------------------+-----+----------+-------+  PTA       65                 0.56  monophasic          +---------+------------------+-----+----------+-------+  DP        73                 0.62  monophasic          +---------+------------------+-----+----------+-------+  Great Toe 25                 0.21  Abnormal            +---------+------------------+-----+----------+-------+ +-------+-----------+-----------+------------+------------+  ABI/TBI Today's ABI Today's TBI Previous ABI Previous TBI  +-------+-----------+-----------+------------+------------+  Right   0.95        1.41        0.94         1.18          +-------+-----------+-----------+------------+------------+  Left    0.62        0.21        [place]  0.60          +-------+-----------+-----------+------------+------------+   Summary: Right: Resting right ankle-brachial index indicates mild right lower extremity arterial disease. The right toe-brachial index is abnormal (falsely elevated - likely due to vessel calcification). Left: Resting left ankle-brachial index indicates moderate left lower extremity arterial disease. The left toe-brachial index is abnormal (severe).  *See table(s) above for measurements and observations.     Preliminary     Cardiac Studies   PERIPHERAL VASCULAR BALLOON ANGIOPLASTY  ABDOMINAL AORTOGRAM W/LOWER EXTREMITY   Angiographic Data:    1: Abdominal aorta-widely patent 2: Left lower extremity-left extrailiac artery stent was widely patent.  There were scattered 50 to 70% calcified focal stenoses in the proximal, mid and distal left SFA.  There was a 99% left P2 popliteal segment stenosis with three-vessel runoff. 3: Right lower extremity-the right common, external iliac and common femoral arteries widely patent   IMPRESSION: Ms. Chauca has critical limb  ischemia with scattered calcified focal stenoses in the proximal, mid and distal left SFA as well as a 99% P2 segment left popliteal artery stenosis probably accounting for her diminished infrapopliteal blood flow and decreased left ABI.  We will proceed with Cutting Balloon angioplasty followed by [facility]   Procedure Description: The endhole catheter was exchanged over a Rosen wire for a 6 [place] 55 cm multipurpose Ansell sheath.  Patient received 11,000 units of heparin with an ACT of 299.  Omnipaque dye was used for the entirety of the intervention.  Retrograde pressures monitored during the case.  I navigated the left SFA with a 6 g 014 Shepperd wire I was able to cross the popliteal lesion and accessed the anterior tibial artery.  I then performed Cutting Balloon angioplasty with a 3.5 mm x 20 mm long score flex Cutting Balloon at nominal pressures for 2 minutes.  I follow this with a 5 mm x 4 cm Ranger DCB at 4 atm for 3 minutes resulting in reduction of a 99% P2 segment left popliteal artery stenosis to 0% residual with excellent flow.  The patient tolerated the procedure well.  The wire was removed.  The Ansell sheath was removed over an 035 versa core wire and exchanged for a short 6 [place] sheath which was then secured in place.  The patient received 300 mg of p.o. clopidogrel.   Final Impression: Successful Cutting Balloon angioplasty followed by DCB of 99% P2 segment left popliteal artery stenosis in the setting of critical ischemia.  The sheath will be removed once ACT falls below 170 and pressure held.  She will be treated with DAPT as well as ongoing wound care and antibiotic therapy.  She left the lab in stable condition.  Patient Profile     76 y.o. female with a hx of PVD (hx of intervention on both the right and left SFA for critical ischemia), ESRD on HD, DM, HLD, HTN, bladder CA s/p TURBT and mild AS who is being seen for the evaluation of osteomyelitis, PVD  at the request of Dr.  Eliseo Squires.  Assessment & Plan    Osteomyelitis/PVD   - Presented with worsening non-healing ulcer of left great toe.  MRI showed osteomyelitis. - Seen by Dr. Gwenlyn Found yesterday and underwent  -Successful Cutting Balloon angioplasty followed by [facility] of 99% P2 segment left popliteal artery stenosis - Continue DAPT with aspirin and Plavix - Continue statin   2. HLD - 09/01/2021: Cholesterol 77; HDL 17; LDL Cholesterol 39; Triglycerides 105; VLDL 21  - Continue  Lipitor 83m qd   Sign off per MD.     For questions or updates, please contact CGeorgePlease consult [email or web address] for contact info under        Signed,Leanor Kail PA  09/01/2021, 7:48 AM     Patient seen and examined. Agree with assessment and plan. No chest pain or shortness of breath., currently in dialysis.  Rhythm sinus in the 70s.  Blood pressure stable 142/76.  Right groin catheterization site stable.  We will sign off.   TTroy Sine MD, [facility]2/28/2023 10:13 AM

## 2021-09-02 ENCOUNTER — Encounter (HOSPITAL_COMMUNITY)
Admission: EM | Disposition: A | Discharge status: Home Health | Payer: Self-pay | Source: Home / Self Care | Attending: Internal Medicine

## 2021-09-02 ENCOUNTER — Other Ambulatory Visit: Payer: Self-pay

## 2021-09-02 ENCOUNTER — Inpatient Hospital Stay (HOSPITAL_COMMUNITY): Payer: Medicare HMO

## 2021-09-02 ENCOUNTER — Inpatient Hospital Stay (HOSPITAL_COMMUNITY): Payer: Medicare HMO | Admitting: Certified Registered Nurse Anesthetist

## 2021-09-02 ENCOUNTER — Encounter (HOSPITAL_COMMUNITY): Payer: Self-pay | Admitting: Family Medicine

## 2021-09-02 DIAGNOSIS — N186 End stage renal disease: Secondary | ICD-10-CM

## 2021-09-02 DIAGNOSIS — E1122 Type 2 diabetes mellitus with diabetic chronic kidney disease: Secondary | ICD-10-CM

## 2021-09-02 DIAGNOSIS — I739 Peripheral vascular disease, unspecified: Secondary | ICD-10-CM | POA: Diagnosis not present

## 2021-09-02 DIAGNOSIS — D494 Neoplasm of unspecified behavior of bladder: Secondary | ICD-10-CM | POA: Diagnosis not present

## 2021-09-02 DIAGNOSIS — I12 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 stage renal disease: Secondary | ICD-10-CM

## 2021-09-02 DIAGNOSIS — M86672 Other chronic osteomyelitis, left ankle and foot: Secondary | ICD-10-CM | POA: Diagnosis not present

## 2021-09-02 DIAGNOSIS — E1151 Type 2 diabetes mellitus with diabetic peripheral angiopathy without gangrene: Secondary | ICD-10-CM

## 2021-09-02 HISTORY — PX: AMPUTATION: SHX166

## 2021-09-02 LAB — RENAL FUNCTION PANEL
Albumin: 2.1 g/dL — ABNORMAL LOW (ref 3.5–5.0)
Anion gap: 14 (ref 5–15)
BUN: 24 mg/dL — ABNORMAL HIGH (ref 8–23)
CO2: 25 mmol/L (ref 22–32)
Calcium: 8.3 mg/dL — ABNORMAL LOW (ref 8.9–10.3)
Chloride: 98 mmol/L (ref 98–111)
Creatinine, Ser: 5.91 mg/dL — ABNORMAL HIGH (ref 0.44–1.00)
GFR, Estimated: 7 mL/min — ABNORMAL LOW (ref >60)
Glucose, Bld: 90 mg/dL (ref 70–99)
Phosphorus: 3.6 mg/dL (ref 2.5–4.6)
Potassium: 4.3 mmol/L (ref 3.5–5.1)
Sodium: 137 mmol/L (ref 135–145)

## 2021-09-02 LAB — CBC WITH DIFFERENTIAL/PLATELET
Abs Immature Granulocytes: 0.05 10*3/uL (ref 0.00–0.07)
Basophils Absolute: 0.1 10*3/uL (ref 0.0–0.1)
Basophils Relative: 1 %
Eosinophils Absolute: 0.5 10*3/uL (ref 0.0–0.5)
Eosinophils Relative: 3 %
HCT: 27.9 % — ABNORMAL LOW (ref 36.0–46.0)
Hemoglobin: 8.3 g/dL — ABNORMAL LOW (ref 12.0–15.0)
Immature Granulocytes: 0 %
Lymphocytes Relative: 15 %
Lymphs Abs: 2.1 10*3/uL (ref 0.7–4.0)
MCH: 28.3 pg (ref 26.0–34.0)
MCHC: 29.7 g/dL — ABNORMAL LOW (ref 30.0–36.0)
MCV: 95.2 fL (ref 80.0–100.0)
Monocytes Absolute: 1.2 10*3/uL — ABNORMAL HIGH (ref 0.1–1.0)
Monocytes Relative: 9 %
Neutro Abs: 10 10*3/uL — ABNORMAL HIGH (ref 1.7–7.7)
Neutrophils Relative %: 72 %
Platelets: 399 10*3/uL (ref 150–400)
RBC: 2.93 MIL/uL — ABNORMAL LOW (ref 3.87–5.11)
RDW: 15.6 % — ABNORMAL HIGH (ref 11.5–15.5)
WBC: 13.8 10*3/uL — ABNORMAL HIGH (ref 4.0–10.5)
nRBC: 0 % (ref 0.0–0.2)

## 2021-09-02 LAB — HEPATITIS B DNA, ULTRAQUANTITATIVE, PCR
HBV DNA SERPL PCR-ACNC: NOT DETECTED IU/mL
HBV DNA SERPL PCR-LOG IU: UNDETERMINED log10 IU/mL

## 2021-09-02 LAB — SURGICAL PCR SCREEN
MRSA, PCR: NEGATIVE
Staphylococcus aureus: NEGATIVE

## 2021-09-02 LAB — NO BLOOD PRODUCTS

## 2021-09-02 LAB — HEPATITIS B SURFACE ANTIBODY, QUANTITATIVE
Hep B S AB Quant (Post): 12.8 m[IU]/mL (ref >9.9)
Hep B S AB Quant (Post): 12.7 m[IU]/mL (ref >9.9)

## 2021-09-02 LAB — GLUCOSE, CAPILLARY
Glucose-Capillary: 67 mg/dL — ABNORMAL LOW (ref 70–99)
Glucose-Capillary: 135 mg/dL — ABNORMAL HIGH (ref 70–99)

## 2021-09-02 SURGERY — AMPUTATION DIGIT
Anesthesia: Monitor Anesthesia Care | Site: Toe | Laterality: Left

## 2021-09-02 MED ORDER — FENTANYL CITRATE (PF) 250 MCG/5ML IJ SOLN
INTRAMUSCULAR | Status: DC | PRN
  Administered 2021-09-02: 50 ug via INTRAVENOUS

## 2021-09-02 MED ORDER — LIDOCAINE HCL 1 % IJ SOLN
INTRAMUSCULAR | Status: DC | PRN
  Administered 2021-09-02: 10 mL via INTRAMUSCULAR

## 2021-09-02 MED ORDER — OXYCODONE HCL 5 MG PO TABS: 5.0000 mg | ORAL_TABLET | Freq: Once | ORAL | Status: DC | PRN

## 2021-09-02 MED ORDER — ORAL CARE MOUTH RINSE: 15.0000 mL | Freq: Once | OROMUCOSAL | Status: AC

## 2021-09-02 MED ORDER — OXYCODONE HCL 5 MG/5ML PO SOLN: 5.0000 mg | Freq: Once | ORAL | Status: DC | PRN

## 2021-09-02 MED ORDER — INSULIN ASPART 100 UNIT/ML IJ SOLN: 0.0000 [IU] | INTRAMUSCULAR | Status: DC | PRN

## 2021-09-02 MED ORDER — LIDOCAINE-EPINEPHRINE 1 %-1:100000 IJ SOLN
INTRAMUSCULAR | Status: DC | PRN
  Administered 2021-09-02: 10 mL

## 2021-09-02 MED ORDER — PROPOFOL 500 MG/50ML IV EMUL
INTRAVENOUS | Status: DC | PRN
  Administered 2021-09-02: 50 ug/kg/min via INTRAVENOUS

## 2021-09-02 MED ORDER — FENTANYL CITRATE (PF) 100 MCG/2ML IJ SOLN: 25.0000 ug | INTRAMUSCULAR | Status: DC | PRN

## 2021-09-02 MED ORDER — MIDAZOLAM HCL 2 MG/2ML IJ SOLN
INTRAMUSCULAR | Status: AC
  Filled 2021-09-02: qty 2

## 2021-09-02 MED ORDER — 0.9 % SODIUM CHLORIDE (POUR BTL) OPTIME
TOPICAL | Status: DC | PRN
  Administered 2021-09-02: 1000 mL

## 2021-09-02 MED ORDER — CHLORHEXIDINE GLUCONATE CLOTH 2 % EX PADS
6.0000 | MEDICATED_PAD | Freq: Every day | CUTANEOUS | Status: DC
  Administered 2021-09-03 – 2021-09-04 (×2): 6 via TOPICAL

## 2021-09-02 MED ORDER — CHLORHEXIDINE GLUCONATE 0.12 % MT SOLN: 15.0000 mL | Freq: Once | OROMUCOSAL | Status: AC

## 2021-09-02 MED ORDER — PROPOFOL 10 MG/ML IV BOLUS
INTRAVENOUS | Status: DC | PRN
  Administered 2021-09-02: 30 mg via INTRAVENOUS

## 2021-09-02 MED ORDER — LIDOCAINE HCL (PF) 1 % IJ SOLN
INTRAMUSCULAR | Status: AC
  Filled 2021-09-02: qty 30

## 2021-09-02 MED ORDER — SODIUM CHLORIDE 0.9 % IV SOLN: 100.0000 mL | INTRAVENOUS | Status: DC | PRN

## 2021-09-02 MED ORDER — FENTANYL CITRATE (PF) 250 MCG/5ML IJ SOLN
INTRAMUSCULAR | Status: AC
  Filled 2021-09-02: qty 5

## 2021-09-02 MED ORDER — ONDANSETRON HCL 4 MG/2ML IJ SOLN: 4.0000 mg | Freq: Four times a day (QID) | INTRAMUSCULAR | Status: DC | PRN

## 2021-09-02 MED ORDER — PHENYLEPHRINE 40 MCG/ML (10ML) SYRINGE FOR IV PUSH (FOR BLOOD PRESSURE SUPPORT)
PREFILLED_SYRINGE | INTRAVENOUS | Status: DC | PRN
  Administered 2021-09-02 (×3): 80 ug via INTRAVENOUS
  Administered 2021-09-02: 120 ug via INTRAVENOUS

## 2021-09-02 MED ORDER — EPINEPHRINE PF 1 MG/ML IJ SOLN
INTRAMUSCULAR | Status: AC
  Filled 2021-09-02: qty 1

## 2021-09-02 MED ORDER — BUPIVACAINE HCL (PF) 0.5 % IJ SOLN
INTRAMUSCULAR | Status: AC
  Filled 2021-09-02: qty 30

## 2021-09-02 MED ORDER — DEXTROSE 50 % IV SOLN
INTRAVENOUS | Status: AC
  Administered 2021-09-02: 12.5 g via INTRAVENOUS
  Filled 2021-09-02: qty 50

## 2021-09-02 MED ORDER — VANCOMYCIN HCL 1000 MG IV SOLR
INTRAVENOUS | Status: AC
  Filled 2021-09-02: qty 20

## 2021-09-02 MED ORDER — LACTATED RINGERS IV SOLN: INTRAVENOUS | Status: DC

## 2021-09-02 MED ORDER — SODIUM CHLORIDE 0.9 % IV SOLN
INTRAVENOUS | Status: DC
Start: 2021-09-02 — End: 2021-09-05

## 2021-09-02 MED ORDER — CHLORHEXIDINE GLUCONATE 0.12 % MT SOLN
OROMUCOSAL | Status: AC
  Administered 2021-09-02: 15 mL via OROMUCOSAL
  Filled 2021-09-02: qty 15

## 2021-09-02 MED ORDER — PROPOFOL 500 MG/50ML IV EMUL
INTRAVENOUS | Status: AC
  Filled 2021-09-02: qty 50

## 2021-09-02 MED ORDER — DEXTROSE 50 % IV SOLN
12.5000 g | INTRAVENOUS | Status: AC
  Administered 2021-09-02: 12.5 g via INTRAVENOUS

## 2021-09-02 MED ORDER — ONDANSETRON HCL 4 MG/2ML IJ SOLN
INTRAMUSCULAR | Status: DC | PRN
Start: 2021-09-02 — End: 2021-09-02
  Administered 2021-09-02: 4 mg via INTRAVENOUS

## 2021-09-02 SURGICAL SUPPLY — 36 items
BAG COUNTER SPONGE SURGICOUNT (BAG) IMPLANT
BAG SPNG CNTER NS LX DISP (BAG)
BNDG ADH 5X4 AIR PERM ELC (GAUZE/BANDAGES/DRESSINGS) ×1
BNDG CMPR 9X4 STRL LF SNTH (GAUZE/BANDAGES/DRESSINGS)
BNDG COHESIVE 4X5 TAN STRL (GAUZE/BANDAGES/DRESSINGS) ×3 IMPLANT
BNDG COHESIVE 4X5 WHT NS (GAUZE/BANDAGES/DRESSINGS) ×1 IMPLANT
BNDG ELASTIC 4X5.8 VLCR STR LF (GAUZE/BANDAGES/DRESSINGS) ×4 IMPLANT
BNDG ESMARK 4X9 LF (GAUZE/BANDAGES/DRESSINGS) IMPLANT
BNDG GAUZE ELAST 4 BULKY (GAUZE/BANDAGES/DRESSINGS) ×4 IMPLANT
COVER SURGICAL LIGHT HANDLE (MISCELLANEOUS) ×6 IMPLANT
CUFF TOURN SGL QUICK 18X4 (TOURNIQUET CUFF) ×3 IMPLANT
DRSG PAD ABDOMINAL 8X10 ST (GAUZE/BANDAGES/DRESSINGS) ×3 IMPLANT
ELECT REM PT RETURN 9FT ADLT (ELECTROSURGICAL) ×2
ELECTRODE REM PT RTRN 9FT ADLT (ELECTROSURGICAL) ×2 IMPLANT
GAUZE PETROLATUM 1 X8 (GAUZE/BANDAGES/DRESSINGS) ×1 IMPLANT
GAUZE SPONGE 4X4 12PLY STRL (GAUZE/BANDAGES/DRESSINGS) ×1 IMPLANT
GAUZE XEROFORM 1X8 LF (GAUZE/BANDAGES/DRESSINGS) ×1 IMPLANT
GLOVE SURG ENC MOIS LTX SZ7 (GLOVE) ×3 IMPLANT
GLOVE SURG UNDER POLY LF SZ7.5 (GLOVE) ×3 IMPLANT
GOWN STRL REUS W/ TWL LRG LVL3 (GOWN DISPOSABLE) ×4 IMPLANT
GOWN STRL REUS W/TWL LRG LVL3 (GOWN DISPOSABLE) ×4
HANDPIECE INTERPULSE COAX TIP (DISPOSABLE) ×2
IV NS 1000ML (IV SOLUTION) ×2
IV NS 1000ML BAXH (IV SOLUTION) ×2 IMPLANT
KIT BASIN OR (CUSTOM PROCEDURE TRAY) ×3 IMPLANT
KIT TURNOVER KIT B (KITS) ×3 IMPLANT
MANIFOLD NEPTUNE II (INSTRUMENTS) ×3 IMPLANT
NEEDLE 22X1 1/2 (OR ONLY) (NEEDLE) IMPLANT
NS IRRIG 1000ML POUR BTL (IV SOLUTION) ×3 IMPLANT
PACK ORTHO EXTREMITY (CUSTOM PROCEDURE TRAY) ×3 IMPLANT
PAD ARMBOARD 7.5X6 YLW CONV (MISCELLANEOUS) ×6 IMPLANT
SET HNDPC FAN SPRY TIP SCT (DISPOSABLE) ×2 IMPLANT
SUT PROLENE 3 0 SH DA (SUTURE) ×1 IMPLANT
SYR CONTROL 10ML LL (SYRINGE) IMPLANT
TOWEL GREEN STERILE (TOWEL DISPOSABLE) ×3 IMPLANT
TUBE CONNECTING 12X1/4 (SUCTIONS) ×1 IMPLANT

## 2021-09-02 NOTE — Progress Notes (Addendum)
Initial Nutrition Assessment ? ?DOCUMENTATION CODES:  ? ?Not applicable ? ?INTERVENTION:  ?- Recommend liberalizing diet from carb-modified to regular  ? ?- -1 packet Juven BID, each packet provides 95 calories, 2.5 grams of protein (collagen), and 9.8 grams of carbohydrate (3 grams sugar); also contains 7 grams of L-arginine and L-glutamine, 300 mg vitamin C, 15 mg vitamin E, 1.2 mcg vitamin B-12, 9.5 mg zinc, 200 mg calcium, and 1.5 g  Calcium Beta-hydroxy-Beta-methylbutyrate to support wound healing ? ?- Renavit daily  ? ?- Encourage PO intake ? ? ?NUTRITION DIAGNOSIS:  ? ?Increased nutrient needs related to post-op healing (amputation of left great toe) as evidenced by estimated needs. ? ? ?GOAL:  ? ?Patient will meet greater than or equal to 90% of their needs ? ? ?MONITOR:  ? ?PO intake, Supplement acceptance, Weight trends, Labs, I & O's ? ?REASON FOR ASSESSMENT:  ? ?Other (Comment) (wounds) ?  ? ?ASSESSMENT:  ? ? Pt is a 76 year old female with medical history significant of PAD s/p DES bilaterally on aspirin and plavix, ESRD on HD TTS, T2DM, iron deficiency anemia, HLD, bladder cancer s/p TURBT on 07/22/2021 who presented to the ED with worsening non-healing ulcer of left great toe and admitted for osteomyelitis of gangrenous left great toe.  ? ?3/1 - amputation of left great toe  ? ?Pt is currently on a carb-modified diet.  ? ?Met with pt at bedside. Pt's breakfast tray was just being delivered around noon since pt had HD this AM and missed breakfast. Per pt, pt endorses a good appetite today and denies any nausea, vomiting or constipation. Pt had on breakfast tray: 2 pancakes and 2 slices of bacon and a hot lipton tea. Per pt, pt reports a fair to good appetite prior to admission. Pt reports that she lives alone and was not using a walker or cane prior to admission, but unsure if she will have to moving forward after her toe amputation procedure. Pt reports that she tries to eat 3 meals a day, but  typically skips lunch. For breakfast, pt reports she has a piece of toast, omelette with tomatoes, and a cup of tea. For dinner, pt reports that she typically has a high protein food, such as chicken or fish. Pt seemed to be having a difficult time trying to recall what she ate at home PTA, but was able to provide some dietary recall. Per pt, pt denies any significant weight loss. Pt reports that she tries to be active and that she goes to the gym and walks outside.  ? ?Discussed with pt the importance of good nutrition and PO intake for post-op healing. Encouraged pt to consume high protein foods and consistently eat meals throughout the day for adequate PO intake. Discussed oral nutrition supplementation with pt and pt agreeable to trying Juven to promote post-op healing. Pt reports that she does not like Ensure products. Pt reports that she takes some type of renal MVI at home and pt agreeable to taking Renavit during hospital stay. Dietetic intern to reach out to MD in regards to liberalizing pt's diet as diabetes is well controlled and to promote PO intake for post-op healing.  ? ?Weight history reviewed and pt's weight appears stable. No significant weight loss noted.  ?Admit wt: 72.6 kg  ?Current wt: 76.3 kg  ? ?Net UF at HD: 2 L  ? ?Medications reviewed and include: sensipar, hectorol (on TTS at HD), senokot-s prn  ? ?Labs reviewed and include:  ?  CBG: 135 ?Corrected calcium: 9.8  ?Hemoglobin: 7.7 (low)  ? ? ?NUTRITION - FOCUSED PHYSICAL EXAM: ? ?Flowsheet Row Most Recent Value  ?Orbital Region Mild depletion  ?Upper Arm Region No depletion  ?Thoracic and Lumbar Region No depletion  ?Buccal Region No depletion  ?Temple Region No depletion  ?Clavicle Bone Region No depletion  ?Clavicle and Acromion Bone Region No depletion  ?Scapular Bone Region No depletion  ?Dorsal Hand No depletion  ?Patellar Region No depletion  ?Anterior Thigh Region No depletion  ?Posterior Calf Region Mild depletion  ?Edema (RD  Assessment) None  ?Hair Reviewed  ?Eyes Reviewed  ?Mouth Reviewed  ?Skin Reviewed  ?Nails Reviewed  ? ?  ? ? ?Diet Order:   ?Diet Order   ? ?       ?  Diet regular Room service appropriate? Yes; Fluid consistency: Thin  Diet effective now       ?  ? ?  ?  ? ?  ? ? ?EDUCATION NEEDS:  ? ?Education needs have been addressed ? ?Skin:  Skin Assessment: Skin Integrity Issues: ?Skin Integrity Issues:: Other (Comment) ?Other: non-pressure wound; great left toe ? ?Last BM:  3/1 ? ?Height:  ? ?Ht Readings from Last 1 Encounters:  ?09/02/21 5' 4" (1.626 m)  ? ? ?Weight:  ? ?Wt Readings from Last 1 Encounters:  ?09/03/21 73 kg  ? ? ?Ideal Body Weight:  54.5 kg ? ?BMI:  Body mass index is 27.62 kg/m?. ? ?Estimated Nutritional Needs:  ? ?Kcal:  1800 - 2000 ? ?Protein:  95 - 110 grams ? ?Fluid:  < 1.8 L ? ? ? ?Maryruth Hancock, Dietetic Intern ?09/03/2021 2:40 PM ?

## 2021-09-02 NOTE — Op Note (Signed)
Surgeon: Surgeon(s): ?Felipa Furnace, DPM  ?Assistants: None ?Pre-operative diagnosis: Left toe infection  ?Post-operative diagnosis: same ?Procedure: Procedure(s) (LRB): ?AMPUTATION GREAT TOE (Left)  ?Pathology:  ?ID Type Source Tests Collected by Time Destination  ?1 : left great toe amputation Tissue PATH Digit amputation SURGICAL PATHOLOGY Felipa Furnace, DPM 09/02/2021 1237   ?  ?Pertinent Intra-op findings: Left hallux distal phalanx osteomyelitis.  Minimal bleeding* ?Anesthesia: General  ?Hemostasis: * No tourniquets in log * ?EBL: 10 mL  ?Materials: 3-0 Prolene ?Injectables: 10 cc of half percent Marcaine plain 1% lidocaine plain ?Complications: None ? ?Indications for surgery: ?A 76 y.o. female presents with left hallux osteomyelitis. Patient has failed all conservative therapy including but not limited to local wound care and antibiotics. She wishes to have surgical correction of the foot/deformity. It was determined that patient would benefit from left hallux amputation/disarticulation from the MPJ joint. ?Informed surgical risk consent was reviewed and read aloud to the patient.  I reviewed the films.  I have discussed my findings with the patient in great detail.  I have discussed all risks including but not limited to infection, stiffness, scarring, limp, disability, deformity, damage to blood vessels and nerves, numbness, poor healing, need for braces, arthritis, chronic pain, amputation, death.  All benefits and realistic expectations discussed in great detail.  I have made no promises as to the outcome.  I have provided realistic expectations.  I have offered the patient a 2nd opinion, which they have declined and assured me they preferred to proceed despite the risks ? ? ?Procedure in detail: ?The patient was both verbally and visually identified by myself, the nursing staff, and anesthesia staff in the preoperative holding area. They were then transferred to the operating room and placed on the  operative table in supine position. ? ?Attention was directed to the left hallux, skin marker was used to delineate the incision.  Given the nature of the discoloration that was present at the digit I believe patient will benefit from disarticulation of the metatarsophalangeal joint of the left.  Using #15 blade the incision was carried down to the level of the bone.  The toe was disarticulated at the metatarsophalangeal joint and passed off to pathology sterile technique.  Using normal saline solution the wound bed was thoroughly irrigated.  No clinical signs of infection noted no purulent drainage noted.  Minimal bleeding noted.  At this time the decision was made to close the wound.  Using 3-0 Prolene the wound was closed in simple interrupted suture technique.  All bony prominences were padded.  The wound was dressed with Xeroform, Betadine soaked gauze, gauze Kling Kerlix. ? ?Disposition ?Patient is okay to be discharged from our standpoint.  She can be discharged on oral antibiotics for 14 days.  The proximal margin of the bone appeared clear of osteomyelitis.  Weightbearing as tolerated in a surgical shoe.  No dressing change until follow-up.  She will follow-up 1 week from discharge. ? ?At the conclusion of the procedure the patient was awoken from anesthesia and found to have tolerated the procedure well any complications. There were transferred to PACU with vital signs stable and vascular status intact. ? ?Boneta Lucks, DPM ? ?

## 2021-09-02 NOTE — Progress Notes (Signed)
PROGRESS NOTE    Joanne Carlson  JQG:[id number] DOB: Oct 29, 1945 DOA: 08/29/2021 PCP: Vernie Shanks, MD    Chief Complaint  Patient presents with   toe swelling    Brief Narrative:  Joanne Carlson is a 76 y.o. female with medical history significant of PAD s/p DES bilaterally on aspirin and plavix, ESRD HD TTS, Type 2 DM, bladder cancer s/p TURBT, HLD, iron deficiency anemia who presents with worsening non-healing ulcer of left great toe.  MRI done and shows osteomyelitis.  Podiatry consulted/cardiology (vascular) consulted/nephrology consulted.  Plan for partial hallus amputation this morning per podiatry.  ID consultation pending for antibiotic recommendations and duration as well.    Assessment & Plan:  Principal Problem:   Osteomyelitis ([place]) Active Problems:   Type 2 diabetes mellitus without complication, with long-term current use of insulin (HCC)   End stage renal disease (HCC)   Bladder tumor   PVD (peripheral vascular disease) ([place])    Assessment and Plan: * Osteomyelitis ([place])- (present on admission) -Has known PAD followed by cardiology Dr. Gwenlyn Found. Last peripheral angiogram on 03/03/2020 revealing minimal disease in the mid and distal right SFA in the 50% range, patent left external iliac artery stents with high-grade segmental calcified proximal mid left SFA stenosis.  She underwent shockwave intravascular lithotripsy and drug-coated balloon angioplasty with good results. - left LE ABI done - X-ray of the foot with lucency at the base of the distal phalanx concerning for osteomyelitis- MRI confirms -podiatry consulted - plan on doing partial hallux amputation to the left side today 09/02/2021. -We will discontinue IV Zosyn pending surgical amputation in hopes of cultures being obtained during surgery and resume IV Zosyn postoperatively. -Continue IV vancomycin every 48 hours. -Dr. Gwenlyn Found: s/p Successful Cutting Balloon angioplasty followed by [facility] of 99% P2 segment left  popliteal artery stenosis. -Consult with ID for antibiotic duration and recommendations postoperatively.    PVD (peripheral vascular disease) ([place]) -History of bilateral SFA stent and follows with cardiology Dr. Gwenlyn Found -- Continue DAPT with aspirin and Plavix. -Outpatient follow-up with Dr. Gwenlyn Found  Bladder tumor- (present on admission) - S/p TURBT on 07/22/2021. -Outpatient follow-up with urology.  End stage renal disease ([place])- (present on admission) -had HD 09/01/2021.  Creatinine at 5.91. -Patient on HD TTS. -Nephrology consulted and following.  Type 2 diabetes mellitus without complication, with long-term current use of insulin (HCC) Controlled.  Hemoglobin A1c of 5.7 (07/15/2021) -CBG 143 last night and blood glucose levels this morning of 90 on lab work. -Follow.         DVT prophylaxis: Patient for procedure today, anticoagulation on hold. Code Status: Full Family Communication: Updated patient.  No family at bedside. Disposition: TBD  Status is: Inpatient Remains inpatient appropriate because: Severity of           Consultants:  Podiatry: Dr. Jacqualyn Posey 08/30/2021 Cardiology: Dr.Berry 08/31/2021 Nephrology: Dr. Hollie Salk 08/31/2021  Procedures:  Plain films of the left foot 08/29/2021 MRI left foot 08/30/2021 ABI with TBI 08/30/2021 Abdominal aortogram/bilateral iliac angiogram with left lower extremity angiography with runoff with Cutting Balloon angioplasty followed by [facility] left P2 segment of popliteal artery per Dr. Gwenlyn Found 08/31/2021   Antimicrobials:  -IV vancomycin 08/29/2021>>>> 09/02/2021 IV Zosyn 08/29/2021>>>> 09/02/2021   Subjective: Awake.  Alert.  No chest pain.  No shortness of breath.  No abdominal pain.  States lower extremity swelling has improved significantly since admission.  Awaiting to go to surgery.  Objective: Vitals:   09/01/21 1733 09/01/21 1736 09/01/21 1936  09/02/21 0526  BP: 92/78 92/78 (!) 121/52 (!) 106/57  Pulse:   84   Resp:   19 18   Temp: 97.9 F (36.6 C) 97.9 F (36.6 C) 98 F (36.7 C) 98.6 F (37 C)  TempSrc: Oral Oral Oral Oral  SpO2:   99% 100%  Weight:      Height:        Intake/Output Summary (Last 24 hours) at 09/02/2021 1030 Last data filed at 09/01/2021 1504 Gross per 24 hour  Intake 50.03 ml  Output 2000 ml  Net -1949.97 ml   Filed Weights   08/29/21 1631 09/01/21 0824  Weight: 72.6 kg 76.3 kg    Examination:  General exam: Appears calm and comfortable  Respiratory system: Clear to auscultation. Respiratory effort normal. Cardiovascular system: S1 & S2 heard, RRR. No JVD, murmurs, rubs, gallops or clicks. No pedal edema. Gastrointestinal system: Abdomen is nondistended, soft and nontender. No organomegaly or masses felt. Normal bowel sounds heard. Central nervous system: Alert and oriented. No focal neurological deficits. Extremities: Left great toe with Band-Aid.  Skin: No rashes, lesions or ulcers Psychiatry: Judgement and insight appear normal. Mood & affect appropriate.     Data Reviewed:   CBC: Recent Labs  Lab 08/29/21 1719 08/30/21 0023 08/31/21 0324 09/01/21 0209 09/02/21 0855  WBC 13.6* 12.5* 13.4* 12.7* 13.8*  NEUTROABS 10.6*  --   --   --  10.0*  HGB 9.1* 8.4* 7.9* 8.1* 8.3*  HCT 29.6* 28.1* 25.9* 25.8* 27.9*  MCV 95.8 96.9 95.2 93.5 95.2  PLT 380 368 344 356 762    Basic Metabolic Panel: Recent Labs  Lab 08/29/21 1719 08/30/21 0023 08/31/21 0324 09/01/21 0209 09/02/21 0855  NA 135 138 138 135 137  K 3.4* 3.6 4.0 4.2 4.3  CL 91* 94* 96* 92* 98  CO2 34* 32 29 26 25   GLUCOSE 135* 112* 145* 130* 90  BUN 18 19 39* 46* 24*  CREATININE 3.85* 4.01* 6.72* 8.02* 5.91*  CALCIUM 8.2* 8.0* 7.6* 7.8* 8.3*  PHOS  --   --   --  4.1 3.6    GFR: Estimated Creatinine Clearance: 8.1 mL/min (A) (by C-G formula based on SCr of 5.91 mg/dL (H)).  Liver Function Tests: Recent Labs  Lab 08/29/21 1719 09/02/21 0855  AST 34  --   ALT 23  --   ALKPHOS 94  --   BILITOT  0.5  --   PROT 7.7  --   ALBUMIN 2.6* 2.1*    CBG: Recent Labs  Lab 08/31/21 1642 08/31/21 1707 08/31/21 1823 08/31/21 1931 09/01/21 1727  GLUCAP 51* 75 106* 80 143*     Recent Results (from the past 240 hour(s))  Blood culture (routine x 2)     Status: None (Preliminary result)   Collection Time: 08/29/21  5:19 PM   Specimen: BLOOD  Result Value Ref Range Status   Specimen Description   Final    BLOOD BLOOD LEFT ARM Performed at [facility], [place] [address] [place], [place] [address]    Special Requests   Final    BOTTLES DRAWN AEROBIC AND ANAEROBIC Blood Culture adequate volume Performed at [place] [address] [place], [place] [address]    Culture   Final    NO GROWTH 3 DAYS Performed at [facility], [place] [address] [place],  [address]    Report Status PENDING  Incomplete  Resp Panel by RT-PCR (Flu A&B, Covid) Nasopharyngeal  Swab     Status: None   Collection Time: 08/29/21  5:19 PM   Specimen: Nasopharyngeal Swab; Nasopharyngeal(NP) swabs in vial transport medium  Result Value Ref Range Status   SARS Coronavirus 2 by RT PCR NEGATIVE NEGATIVE Final    Comment: (NOTE) SARS-CoV-2 target nucleic acids are NOT DETECTED.  The SARS-CoV-2 RNA is generally detectable in upper respiratory specimens during the acute phase of infection. The lowest concentration of SARS-CoV-2 viral copies this assay can detect is 138 copies/mL. A negative result does not preclude SARS-Cov-2 infection and should not be used as the sole basis for treatment or other patient management decisions. A negative result may occur with  improper specimen collection/handling, submission of specimen other than nasopharyngeal swab, presence of viral mutation(s) within the areas targeted by this assay, and inadequate number of viral copies(<138 copies/mL). A negative result must be combined with clinical observations, patient history, and  epidemiological information. The expected result is Negative.  Fact Sheet for Patients:  EntrepreneurPulse.com.au  Fact Sheet for Healthcare Providers:  IncredibleEmployment.be  This test is no t yet approved or cleared by the [place] FDA and  has been authorized for detection and/or diagnosis of SARS-CoV-2 by FDA under an Emergency Use Authorization (EUA). This EUA will remain  in effect (meaning this test can be used) for the duration of the COVID-19 declaration under Section 564(b)(1) of the Act, 21 U.S.C.section 360bbb-3(b)(1), unless the authorization is terminated  or revoked sooner.       Influenza A by PCR NEGATIVE NEGATIVE Final   Influenza B by PCR NEGATIVE NEGATIVE Final    Comment: (NOTE) The Xpert Xpress SARS-CoV-2/FLU/RSV plus assay is intended as an aid in the diagnosis of influenza from Nasopharyngeal swab specimens and should not be used as a sole basis for treatment. Nasal washings and aspirates are unacceptable for Xpert Xpress SARS-CoV-2/FLU/RSV testing.  Fact Sheet for Patients: EntrepreneurPulse.com.au  Fact Sheet for Healthcare Providers: IncredibleEmployment.be  This test is not yet approved or cleared by the [place] FDA and has been authorized for detection and/or diagnosis of SARS-CoV-2 by FDA under an Emergency Use Authorization (EUA). This EUA will remain in effect (meaning this test can be used) for the duration of the COVID-19 declaration under Section 564(b)(1) of the Act, 21 U.S.C. section 360bbb-3(b)(1), unless the authorization is terminated or revoked.  Performed at [facility], [place] [address], [place], [place] [address]   Blood culture (routine x 2)     Status: None (Preliminary result)   Collection Time: 08/29/21 10:30 PM   Specimen: BLOOD  Result Value Ref Range Status   Specimen Description BLOOD SITE NOT SPECIFIED  Final    Special Requests   Final    BOTTLES DRAWN AEROBIC AND ANAEROBIC Blood Culture adequate volume   Culture   Final    NO GROWTH 3 DAYS Performed at [facility], [address], [place],  [address]    Report Status PENDING  Incomplete  Surgical pcr screen     Status: None   Collection Time: 09/02/21  5:14 AM   Specimen: Nasal Mucosa; Nasal Swab  Result Value Ref Range Status   MRSA, PCR NEGATIVE NEGATIVE Final   Staphylococcus aureus NEGATIVE NEGATIVE Final    Comment: (NOTE) The Xpert SA Assay (FDA approved for NASAL specimens in patients 66 years of age and older), is one component of a comprehensive surveillance program. It is not intended to diagnose infection nor to guide or monitor treatment.  Performed at [facility], [place] [address], [place], [place] [address]          Radiology Studies: PERIPHERAL VASCULAR CATHETERIZATION  Result Date: 08/31/2021 Images from the original result were not included.  [id number] LOCATION:  FACILITY: [facility] PHYSICIAN: [name], M.D. April 07, 1946 DATE OF PROCEDURE:  08/31/2021 DATE OF DISCHARGE: PV Angiogram/Intervention History obtained from chart review.Joanne Carlson is a 76 y.o. female with a hx of PVD, ESRD on HD, DM, HLD, HTN, bladder CA s/p TURBT and mild AS who is being seen 08/31/2021 for the evaluation of osteomyelitis, PVD  at the request of Dr. Eliseo Squires.  I have performed right and left SFA intervention in the past for critical ischemia resulting in wound healing.  She was admitted with a nonhealing wound of her left great toe with evidence of osteomyelitis.  She is on hemodialysis scheduled for HD tomorrow.  She presents now for peripheral angiography potential intervention for limb salvage. Pre Procedure Diagnosis: Critical ischemia/PAD Post Procedure Diagnosis: Critical ischemia/PAD Operators: Dr. [name] Procedures Performed:  1.  Ultrasound-guided right common femoral access  2.  Abdominal aortogram/bilateral iliac  angiogram  3.  Contralateral access (second-order catheter placement)  4.  Left lower extremity angiography with runoff             5.  Score flex Cutting Balloon angioplasty followed by DCB left P2 segment of popliteal artery PROCEDURE DESCRIPTION: The patient was brought to the second floor [place] [facility] cath lab in the the postabsorptive state. She was premedicated with IV Versed and fentanyl. Her right groin was prepped and shaved in usual sterile fashion. Xylocaine 1% was used for local anesthesia. A 5 French sheath was inserted into the right common femoral artery using standard Seldinger technique.  Ultrasound was used to identify the right common femoral artery and guide access.  A digital copy was captured and placed the patient's chart.  A 5 French pigtail catheter was placed in the distal abdominal aorta.  Distal abdominal aortography, bilateral iliac angiography was performed.  Contralateral access was obtained with a 5 [place] crossover catheter and endhole catheter (second-order catheter placement).  Left lower extremity angiography with runoff was performed using bolus chase, digital subtraction and step table technique.  Omnipaque dye was used for the entirety of the case (150 cc total contrast the patient).  Retrograde ordered pressures monitored during the case.  Angiographic Data: 1: Abdominal aorta-widely patent 2: Left lower extremity-left extrailiac artery stent was widely patent.  There were scattered 50 to 70% calcified focal stenoses in the proximal, mid and distal left SFA.  There was a 99% left P2 popliteal segment stenosis with three-vessel runoff. 3: Right lower extremity-the right common, external iliac and common femoral arteries widely patent   Joanne Carlson has critical limb ischemia with scattered calcified focal stenoses in the proximal, mid and distal left SFA as well as a 99% P2 segment left popliteal artery stenosis probably accounting for her diminished infrapopliteal blood  flow and decreased left ABI.  We will proceed with Cutting Balloon angioplasty followed by [facility] Procedure Description: The endhole catheter was exchanged over a Rosen wire for a 6 [place] 55 cm multipurpose Ansell sheath.  Patient received 11,000 units of heparin with an ACT of 299.  Omnipaque dye was used for the entirety of the intervention.  Retrograde pressures monitored during the case. I navigated the left SFA with a 6 g 014 Shepperd wire I was able to cross the popliteal lesion and  accessed the anterior tibial artery.  I then performed Cutting Balloon angioplasty with a 3.5 mm x 20 mm long score flex Cutting Balloon at nominal pressures for 2 minutes.  I follow this with a 5 mm x 4 cm Ranger DCB at 4 atm for 3 minutes resulting in reduction of a 99% P2 segment left popliteal artery stenosis to 0% residual with excellent flow.  The patient tolerated the procedure well.  The wire was removed.  The Ansell sheath was removed over an 035 versa core wire and exchanged for a short 6 [place] sheath which was then secured in place.  The patient received 300 mg of p.o. clopidogrel. Final Impression: Successful Cutting Balloon angioplasty followed by DCB of 99% P2 segment left popliteal artery stenosis in the setting of critical ischemia.  The sheath will be removed once ACT falls below 170 and pressure held.  She will be treated with DAPT as well as ongoing wound care and antibiotic therapy.  She left the lab in stable condition. [name]. MD, [facility] 08/31/2021 4:15 PM         Scheduled Meds:  aspirin EC  81 mg Oral Daily   atorvastatin  80 mg Oral Daily   calcium acetate  667 mg Oral TID WC   cinacalcet  60 mg Oral Q supper   clopidogrel  75 mg Oral Q breakfast   doxercalciferol  2 mcg Intravenous Q T,Th,Sa-HD   gabapentin  100 mg Oral BID   sodium chloride flush  3 mL Intravenous Q12H   Continuous Infusions:  sodium chloride     sodium chloride     sodium chloride     ferric gluconate (FERRLECIT)  IVPB 125 mg (09/01/21 0901)   vancomycin 750 mg (09/01/21 1107)     LOS: 4 days    Time spent: 40 minutes    Irine Seal, MD [facility]   To contact the attending provider between 7A-7P or the covering provider during after hours 7P-7A, please log into the web site [email or web address] and access using universal [place] password for that web site. If you do not have the password, please call the hospital operator.  09/02/2021, 10:30 AM

## 2021-09-02 NOTE — Transfer of Care (Signed)
Immediate Anesthesia Transfer of Care Note ? ?Patient: Joanne Carlson ? ?Procedure(s) Performed: AMPUTATION GREAT TOE (Left) ? ?Patient Location: PACU ? ?Anesthesia Type:MAC ? ?Level of Consciousness: drowsy ? ?Airway & Oxygen Therapy: Patient Spontanous Breathing ? ?Post-op Assessment: Report given to RN and Post -op Vital signs reviewed and stable ? ?Post vital signs: Reviewed and stable ? ?Last Vitals:  ?Vitals Value Taken Time  ?BP    ?Temp    ?Pulse 70 09/02/21 1250  ?Resp 12 09/02/21 1250  ?SpO2 100 % 09/02/21 1250  ?Vitals shown include unvalidated device data. ? ?Last Pain:  ?Vitals:  ? 09/02/21 1155  ?TempSrc:   ?PainSc: 0-No pain  ?   ? ?  ? ?Complications: No notable events documented. ?

## 2021-09-02 NOTE — Progress Notes (Signed)
Per report pt for amputation of L great toe tomorrow. No orders placed for prep/consent etc. Pt is on OR schedule for tomorrow at 10:45. Will keep pt NPO and clarify in am. Pt states surgery was mentioned to her but not sure of when. Will continue to monitor. Jessie Foot, RN  ?

## 2021-09-02 NOTE — Progress Notes (Signed)
[facility] Progress Note   Subjective:    Seen and examined patient at bedside. No complaints. Tolerated yesterday's HD with net UF 2L. Scheduled for hallux amputation to L great toe today.  Objective Vitals:   09/01/21 1733 09/01/21 1736 09/01/21 1936 09/02/21 0526  BP: 92/78 92/78 (!) 121/52 (!) 106/57  Pulse:   84   Resp:   19 18  Temp: 97.9 F (36.6 C) 97.9 F (36.6 C) 98 F (36.7 C) 98.6 F (37 C)  TempSrc: Oral Oral Oral Oral  SpO2:   99% 100%  Weight:      Height:       Physical Exam General: Well-appearing; NAD Heart: S1 and S2; No murmurs, gallops, or rubs Lungs: Clear throughout Abdomen: Soft Extremities: No edema BLLE; bandage present at L great toe Dialysis Access: AVF   [facility] Weights   08/29/21 1631 09/01/21 0824  Weight: 72.6 kg 76.3 kg    Intake/Output Summary (Last 24 hours) at 09/02/2021 1032 Last data filed at 09/01/2021 1504 Gross per 24 hour  Intake 50.03 ml  Output 2000 ml  Net -1949.97 ml    Additional Objective Labs: Basic Metabolic Panel: Recent Labs  Lab 08/31/21 0324 09/01/21 0209 09/02/21 0855  NA 138 135 137  K 4.0 4.2 4.3  CL 96* 92* 98  CO2 _0 GLUCOSE 145* 130* 90  BUN 39* 46* 24*  CREATININE 6.72* 8.02* 5.91*  CALCIUM 7.6* 7.8* 8.3*  PHOS  --  4.1 3.6   Liver Function Tests: Recent Labs  Lab 08/29/21 1719 09/02/21 0855  AST 34  --   ALT 23  --   ALKPHOS 94  --   BILITOT 0.5  --   PROT 7.7  --   ALBUMIN 2.6* 2.1*   No results for input(s): LIPASE, AMYLASE in the last 168 hours. CBC: Recent Labs  Lab 08/29/21 1719 08/30/21 0023 08/31/21 0324 09/01/21 0209 09/02/21 0855  WBC 13.6* 12.5* 13.4* 12.7* 13.8*  NEUTROABS 10.6*  --   --   --  10.0*  HGB 9.1* 8.4* 7.9* 8.1* 8.3*  HCT 29.6* 28.1* 25.9* 25.8* 27.9*  MCV 95.8 96.9 95.2 93.5 95.2  PLT 380 368 344 356 399   Blood Culture    Component Value Date/Time   SDES BLOOD SITE NOT SPECIFIED 08/29/2021 2230   SPECREQUEST  08/29/2021  2230    BOTTLES DRAWN AEROBIC AND ANAEROBIC Blood Culture adequate volume   CULT  08/29/2021 2230    NO GROWTH 3 DAYS Performed at [facility], [place] [address], [place], [place] [address]    REPTSTATUS PENDING 08/29/2021 2230    Cardiac Enzymes: No results for input(s): CKTOTAL, CKMB, CKMBINDEX, TROPONINI in the last 168 hours. CBG: Recent Labs  Lab 08/31/21 1642 08/31/21 1707 08/31/21 1823 08/31/21 1931 09/01/21 1727  GLUCAP 51* 75 106* 80 143*   Iron Studies: No results for input(s): IRON, TIBC, TRANSFERRIN, FERRITIN in the last 72 hours. No results found for: INR, PROTIME Studies/Results: PERIPHERAL VASCULAR CATHETERIZATION  Result Date: 08/31/2021 Images from the original result were not included.  [id number] LOCATION:  FACILITY: [facility] PHYSICIAN: [name], M.D. 77-11-1945 DATE OF PROCEDURE:  08/31/2021 DATE OF DISCHARGE: PV Angiogram/Intervention History obtained from chart review.Joanne Carlson is a 76 y.o. female with a hx of PVD, ESRD on HD, DM, HLD, HTN, bladder CA s/p TURBT and mild AS who is being seen 08/31/2021 for the evaluation of osteomyelitis, PVD  at the request of Dr.  Vann.  I have performed right and left SFA intervention in the past for critical ischemia resulting in wound healing.  She was admitted with a nonhealing wound of her left great toe with evidence of osteomyelitis.  She is on hemodialysis scheduled for HD tomorrow.  She presents now for peripheral angiography potential intervention for limb salvage. Pre Procedure Diagnosis: Critical ischemia/PAD Post Procedure Diagnosis: Critical ischemia/PAD Operators: Dr. [name] Procedures Performed:  1.  Ultrasound-guided right common femoral access  2.  Abdominal aortogram/bilateral iliac angiogram  3.  Contralateral access (second-order catheter placement)  4.  Left lower extremity angiography with runoff             5.  Score flex Cutting Balloon angioplasty followed by DCB left P2 segment of popliteal  artery PROCEDURE DESCRIPTION: The patient was brought to the second floor [facility] lab in the the postabsorptive state. She was premedicated with IV Versed and fentanyl. Her right groin was prepped and shaved in usual sterile fashion. Xylocaine 1% was used for local anesthesia. A 5 French sheath was inserted into the right common femoral artery using standard Seldinger technique.  Ultrasound was used to identify the right common femoral artery and guide access.  A digital copy was captured and placed the patient's chart.  A 5 French pigtail catheter was placed in the distal abdominal aorta.  Distal abdominal aortography, bilateral iliac angiography was performed.  Contralateral access was obtained with a 5 [place] crossover catheter and endhole catheter (second-order catheter placement).  Left lower extremity angiography with runoff was performed using bolus chase, digital subtraction and step table technique.  Omnipaque dye was used for the entirety of the case (150 cc total contrast the patient).  Retrograde ordered pressures monitored during the case.  Angiographic Data: 1: Abdominal aorta-widely patent 2: Left lower extremity-left extrailiac artery stent was widely patent.  There were scattered 50 to 70% calcified focal stenoses in the proximal, mid and distal left SFA.  There was a 99% left P2 popliteal segment stenosis with three-vessel runoff. 3: Right lower extremity-the right common, external iliac and common femoral arteries widely patent   Ms. Joubert has critical limb ischemia with scattered calcified focal stenoses in the proximal, mid and distal left SFA as well as a 99% P2 segment left popliteal artery stenosis probably accounting for her diminished infrapopliteal blood flow and decreased left ABI.  We will proceed with Cutting Balloon angioplasty followed by [facility] Procedure Description: The endhole catheter was exchanged over a Rosen wire for a 6 [place] 55 cm multipurpose Ansell sheath.   Patient received 11,000 units of heparin with an ACT of 299.  Omnipaque dye was used for the entirety of the intervention.  Retrograde pressures monitored during the case. I navigated the left SFA with a 6 g 014 Shepperd wire I was able to cross the popliteal lesion and accessed the anterior tibial artery.  I then performed Cutting Balloon angioplasty with a 3.5 mm x 20 mm long score flex Cutting Balloon at nominal pressures for 2 minutes.  I follow this with a 5 mm x 4 cm Ranger DCB at 4 atm for 3 minutes resulting in reduction of a 99% P2 segment left popliteal artery stenosis to 0% residual with excellent flow.  The patient tolerated the procedure well.  The wire was removed.  The Ansell sheath was removed over an 035 versa core wire and exchanged for a short 6 [place] sheath which was then secured in place.  The  patient received 300 mg of p.o. clopidogrel. Final Impression: Successful Cutting Balloon angioplasty followed by DCB of 99% P2 segment left popliteal artery stenosis in the setting of critical ischemia.  The sheath will be removed once ACT falls below 170 and pressure held.  She will be treated with DAPT as well as ongoing wound care and antibiotic therapy.  She left the lab in stable condition. [name]. MD, [facility] 08/31/2021 4:15 PM     Medications:  sodium chloride     sodium chloride     sodium chloride     ferric gluconate (FERRLECIT) IVPB 125 mg (09/01/21 0901)   vancomycin 750 mg (09/01/21 1107)    aspirin EC  81 mg Oral Daily   atorvastatin  80 mg Oral Daily   calcium acetate  667 mg Oral TID WC   cinacalcet  60 mg Oral Q supper   clopidogrel  75 mg Oral Q breakfast   doxercalciferol  2 mcg Intravenous Q T,Th,Sa-HD   gabapentin  100 mg Oral BID   sodium chloride flush  3 mL Intravenous Q12H    Dialysis Orders: TTS - [facility]  3hrs25mn, BFR 350, DFR 600,  EDW 73.7kg, 2K/ 2.5Ca  Assessment/Plan: Osteomyelitis L great toe: On ABXs; Podiatry and Cardiology  following; s/p balloon cutting angioplasty L popliteal artery 08/31/21, partial hallux amputation on Wed 09/02/21.  PVD: History of bilateral SFA stent; ASA and Plavix held for now; as above in #1- had L leg critical limb ischemia ESRD - on HD TTS; tolerated yesterday's HD with net UF 2L. Plan for HD 3/2. Hypertension/volume  - BP soft/stable, euvolemic on exam Anemia of CKD - Hgb 8.3; on short Fe load (ordered from outpatient setting); ESA recently given on 08/29/21 Secondary Hyperparathyroidism - PO4 at goal; continue Sensipar, binders, and VDRA Nutrition renal diet/ vits Dispo: inpatient  CTobie Poet NP [facility] 09/02/2021,10:32 AM  LOS: 4 days

## 2021-09-02 NOTE — Interval H&P Note (Signed)
History and Physical Interval Note: ? ?09/02/2021 ?12:03 PM ? ?Joanne Carlson  has presented today for surgery, with the diagnosis of Left toe infection.  The various methods of treatment have been discussed with the patient and family. After consideration of risks, benefits and other options for treatment, the patient has consented to  Procedure(s): ?AMPUTATION GREAT TOE (Left) as a surgical intervention.  The patient's history has been reviewed, patient examined, no change in status, stable for surgery.  I have reviewed the patient's chart and labs.  Questions were answered to the patient's satisfaction.   ? ? ?Felipa Furnace ? ? ?

## 2021-09-02 NOTE — Anesthesia Preprocedure Evaluation (Signed)
Anesthesia Evaluation  ?Patient identified by MRN, date of birth, ID band ?Patient awake ? ? ? ?Reviewed: ?Allergy & Precautions, H&P , NPO status , Patient's Chart, lab work & pertinent test results ? ?Airway ?Mallampati: II ? ? ?Neck ROM: full ? ? ? Dental ?  ?Pulmonary ?shortness of breath, former smoker,  ?  ?breath sounds clear to auscultation ? ? ? ? ? ? Cardiovascular ?hypertension, + Peripheral Vascular Disease  ? ?Rhythm:regular Rate:Normal ? ? ?  ?Neuro/Psych ? Neuromuscular disease CVA   ? GI/Hepatic ?  ?Endo/Other  ?diabetes, Type 2 ? Renal/GU ?ESRF and DialysisRenal disease  ? ?  ?Musculoskeletal ? ?(+) Arthritis ,  ? Abdominal ?  ?Peds ? Hematology ? ?(+) Blood dyscrasia, anemia , JEHOVAH'S WITNESSHg 8.3   ?Anesthesia Other Findings ? ? Reproductive/Obstetrics ? ?  ? ? ? ? ? ? ? ? ? ? ? ? ? ?  ?  ? ? ? ? ? ? ? ? ?Anesthesia Physical ?Anesthesia Plan ? ?ASA: 3 ? ?Anesthesia Plan: MAC  ? ?Post-op Pain Management:   ? ?Induction: Intravenous ? ?PONV Risk Score and Plan: 2 and Propofol infusion and Treatment may vary due to age or medical condition ? ?Airway Management Planned: Simple Face Mask ? ?Additional Equipment:  ? ?Intra-op Plan:  ? ?Post-operative Plan:  ? ?Informed Consent: I have reviewed the patients History and Physical, chart, labs and discussed the procedure including the risks, benefits and alternatives for the proposed anesthesia with the patient or authorized representative who has indicated his/her understanding and acceptance.  ? ? ? ?Dental advisory given ? ?Plan Discussed with: CRNA, Anesthesiologist and Surgeon ? ?Anesthesia Plan Comments:   ? ? ? ? ? ? ?Anesthesia Quick Evaluation ? ?

## 2021-09-03 ENCOUNTER — Encounter (HOSPITAL_COMMUNITY): Payer: Self-pay | Admitting: Podiatry

## 2021-09-03 DIAGNOSIS — I739 Peripheral vascular disease, unspecified: Secondary | ICD-10-CM | POA: Diagnosis not present

## 2021-09-03 DIAGNOSIS — Z5181 Encounter for therapeutic drug level monitoring: Secondary | ICD-10-CM

## 2021-09-03 DIAGNOSIS — M86672 Other chronic osteomyelitis, left ankle and foot: Secondary | ICD-10-CM | POA: Diagnosis not present

## 2021-09-03 DIAGNOSIS — D494 Neoplasm of unspecified behavior of bladder: Secondary | ICD-10-CM | POA: Diagnosis not present

## 2021-09-03 DIAGNOSIS — M86172 Other acute osteomyelitis, left ankle and foot: Secondary | ICD-10-CM | POA: Diagnosis not present

## 2021-09-03 DIAGNOSIS — E1122 Type 2 diabetes mellitus with diabetic chronic kidney disease: Secondary | ICD-10-CM | POA: Diagnosis not present

## 2021-09-03 DIAGNOSIS — L0390 Cellulitis, unspecified: Secondary | ICD-10-CM

## 2021-09-03 DIAGNOSIS — E1169 Type 2 diabetes mellitus with other specified complication: Principal | ICD-10-CM

## 2021-09-03 DIAGNOSIS — N186 End stage renal disease: Secondary | ICD-10-CM | POA: Diagnosis not present

## 2021-09-03 DIAGNOSIS — E1151 Type 2 diabetes mellitus with diabetic peripheral angiopathy without gangrene: Secondary | ICD-10-CM | POA: Diagnosis not present

## 2021-09-03 LAB — RENAL FUNCTION PANEL
Albumin: 1.8 g/dL — ABNORMAL LOW (ref 3.5–5.0)
Anion gap: 12 (ref 5–15)
BUN: 31 mg/dL — ABNORMAL HIGH (ref 8–23)
CO2: 26 mmol/L (ref 22–32)
Calcium: 7.6 mg/dL — ABNORMAL LOW (ref 8.9–10.3)
Chloride: 97 mmol/L — ABNORMAL LOW (ref 98–111)
Creatinine, Ser: 7.18 mg/dL — ABNORMAL HIGH (ref 0.44–1.00)
GFR, Estimated: 5 mL/min — ABNORMAL LOW (ref >60)
Glucose, Bld: 105 mg/dL — ABNORMAL HIGH (ref 70–99)
Phosphorus: 4 mg/dL (ref 2.5–4.6)
Potassium: 4.6 mmol/L (ref 3.5–5.1)
Sodium: 135 mmol/L (ref 135–145)

## 2021-09-03 LAB — CBC WITH DIFFERENTIAL/PLATELET
Abs Immature Granulocytes: 0.05 10*3/uL (ref 0.00–0.07)
Basophils Absolute: 0.1 10*3/uL (ref 0.0–0.1)
Basophils Relative: 0 %
Eosinophils Absolute: 0.5 10*3/uL (ref 0.0–0.5)
Eosinophils Relative: 4 %
HCT: 25.5 % — ABNORMAL LOW (ref 36.0–46.0)
Hemoglobin: 7.7 g/dL — ABNORMAL LOW (ref 12.0–15.0)
Immature Granulocytes: 0 %
Lymphocytes Relative: 13 %
Lymphs Abs: 1.8 10*3/uL (ref 0.7–4.0)
MCH: 28.5 pg (ref 26.0–34.0)
MCHC: 30.2 g/dL (ref 30.0–36.0)
MCV: 94.4 fL (ref 80.0–100.0)
Monocytes Absolute: 1.2 10*3/uL — ABNORMAL HIGH (ref 0.1–1.0)
Monocytes Relative: 9 %
Neutro Abs: 9.9 10*3/uL — ABNORMAL HIGH (ref 1.7–7.7)
Neutrophils Relative %: 74 %
Platelets: 321 10*3/uL (ref 150–400)
RBC: 2.7 MIL/uL — ABNORMAL LOW (ref 3.87–5.11)
RDW: 15.6 % — ABNORMAL HIGH (ref 11.5–15.5)
WBC: 13.5 10*3/uL — ABNORMAL HIGH (ref 4.0–10.5)
nRBC: 0 % (ref 0.0–0.2)

## 2021-09-03 LAB — GLUCOSE, CAPILLARY
Glucose-Capillary: 89 mg/dL (ref 70–99)
Glucose-Capillary: 94 mg/dL (ref 70–99)
Glucose-Capillary: 141 mg/dL — ABNORMAL HIGH (ref 70–99)
Glucose-Capillary: 175 mg/dL — ABNORMAL HIGH (ref 70–99)

## 2021-09-03 LAB — SURGICAL PATHOLOGY

## 2021-09-03 LAB — CULTURE, BLOOD (ROUTINE X 2)
Culture: NO GROWTH
Culture: NO GROWTH
Special Requests: ADEQUATE
Special Requests: ADEQUATE

## 2021-09-03 MED ORDER — JUVEN PO PACK
1.0000 | PACK | Freq: Two times a day (BID) | ORAL | Status: DC
Start: 2021-09-04 — End: 2021-09-05
  Administered 2021-09-04 (×2): 1 via ORAL
  Filled 2021-09-03 (×2): qty 1

## 2021-09-03 MED ORDER — RENA-VITE PO TABS
1.0000 | ORAL_TABLET | Freq: Every day | ORAL | Status: DC
Start: 2021-09-03 — End: 2021-09-05
  Administered 2021-09-03: 1 via ORAL
  Filled 2021-09-03: qty 1

## 2021-09-03 MED ORDER — HEPARIN SODIUM (PORCINE) 5000 UNIT/ML IJ SOLN
5000.0000 [IU] | Freq: Three times a day (TID) | INTRAMUSCULAR | Status: DC
  Administered 2021-09-03 – 2021-09-04 (×3): 5000 [IU] via SUBCUTANEOUS
  Filled 2021-09-03 (×4): qty 1

## 2021-09-03 MED ORDER — SODIUM CHLORIDE 0.9 % IV SOLN
2.0000 g | Freq: Once | INTRAVENOUS | Status: AC
  Administered 2021-09-03: 2 g via INTRAVENOUS
  Filled 2021-09-03 (×2): qty 2

## 2021-09-03 MED ORDER — AMOXICILLIN-POT CLAVULANATE 500-125 MG PO TABS
500.0000 mg | ORAL_TABLET | ORAL | Status: DC
  Filled 2021-09-03: qty 1

## 2021-09-03 MED ORDER — SODIUM CHLORIDE 0.9 % IV SOLN
2.0000 g | INTRAVENOUS | Status: DC
  Filled 2021-09-03: qty 2

## 2021-09-03 MED ORDER — AMOXICILLIN-POT CLAVULANATE 875-125 MG PO TABS: 1.0000 | ORAL_TABLET | Freq: Two times a day (BID) | ORAL | Status: DC

## 2021-09-03 NOTE — Evaluation (Signed)
Occupational Therapy Evaluation ?Patient Details ?Name: Joanne Carlson ?MRN: [id number] ?DOB: Nov 20, 1945 ?Today's Date: 09/03/2021 ? ? ?History of Present Illness 76 yo admitted 2/25 with left great toe ulcer with osteomyelitis s/p toe amputation 3/1. PMhx: ESRD on HD TTS, PAD, DM, bladder CA, HLD, anemia  ? ?Clinical Impression ?  ?Patient presents with above medical history and s/p great toe amputation. On evaluation she exhibits functional upper body strength for transfers and ADLs but overall generally weak arms, set up and seated position for UB ADLs and min guard for quick transfer. She required min assist for LB tasks. Evaluation limited by post op shoe not being present during evaluation and limiting mobility as patient unable to weight bear. Once patient able to weight bear expect she will improve and be able to discharge home with [facility] services. She reports her daughter will be able to stay with her tomorrow. Will follow acutely to advance patient to modified independence and make sure of safety.  ?   ? ?Recommendations for follow up therapy are one component of a multi-disciplinary discharge planning process, led by the attending physician.  Recommendations may be updated based on patient status, additional functional criteria and insurance authorization.  ? ?Follow Up Recommendations ? Home health OT  ?  ?Assistance Recommended at Discharge PRN  ?Patient can return home with the following Assistance with cooking/housework;Assist for transportation ? ?  ?Functional Status Assessment ?    ?Equipment Recommendations ? None recommended by OT  ?  ?Recommendations for Other Services   ? ? ?  ?Precautions / Restrictions Precautions ?Precautions: Fall ?Precaution Comments: persistent vaginal bleeding ?Required Braces or Orthoses: Other Brace ?Other Brace: post op shoe ?Restrictions ?Weight Bearing Restrictions: No  ? ?  ? ?Mobility Bed Mobility ?Overal bed mobility: Needs Assistance ?Bed Mobility: Supine to Sit ?  ?   ?Supine to sit: Supervision ?  ?  ?  ?  ? ?Transfers ?Overall transfer level: Needs assistance ?Equipment used: Rolling walker (2 wheels) ?Transfers: Sit to/from Stand, Bed to chair/wheelchair/BSC ?Sit to Stand: Min guard, From elevated surface ?Stand pivot transfers: Min guard ?  ?  ?  ?  ?  ?  ? ?  ?Balance Overall balance assessment: Mild deficits observed, not formally tested ?  ?  ?  ?  ?  ?  ?  ?  ?  ?  ?  ?  ?  ?  ?  ?  ?  ?  ?   ? ?ADL either performed or assessed with clinical judgement  ? ?ADL Overall ADL's : Needs assistance/impaired ?Eating/Feeding: Independent ?  ?Grooming: Set up;Sitting ?  ?Upper Body Bathing: Set up;Sitting ?  ?Lower Body Bathing: Sitting/lateral leans;Set up;Minimal assistance ?  ?Upper Body Dressing : Set up;Sitting ?  ?Lower Body Dressing: Minimal assistance;Sit to/from stand ?  ?Toilet Transfer: Min guard;Rolling walker (2 wheels);BSC/3in1 ?  ?Toileting- Clothing Manipulation and Hygiene: Minimal assistance;Sit to/from stand ?  ?  ?  ?Functional mobility during ADLs: Min guard;Rolling walker (2 wheels) ?General ADL Comments: patient required assistance with pericare at bed level after found to be incontinent due to lack of post op shoe and therapist wanted to limit standing. Otherwise patient aboe to stand and transfer to recliner with min guard and walker. Patient able to don right sock with therapist assisting with keeping her leg up.  ? ? ? ?Vision Baseline Vision/History: 1 Wears glasses ?   ?   ?Perception   ?  ?Praxis   ?  ? ?  Pertinent Vitals/Pain Pain Assessment ?Pain Assessment: Faces ?Faces Pain Scale: Hurts a little bit ?Pain Location: L foot ?Pain Descriptors / Indicators: Grimacing ?Pain Intervention(s): Monitored during session  ? ? ? ?Hand Dominance Right ?  ?Extremity/Trunk Assessment Upper Extremity Assessment ?Upper Extremity Assessment: Overall WFL for tasks assessed (WFL ROM, grossly 3+/5 strength - but able to perform functional tasks) ?  ?Lower Extremity  Assessment ?Lower Extremity Assessment: Defer to PT evaluation ?  ?Cervical / Trunk Assessment ?Cervical / Trunk Assessment: Normal ?  ?Communication Communication ?Communication: No difficulties ?  ?Cognition Arousal/Alertness: Awake/alert ?Behavior During Therapy: [facility] for tasks assessed/performed ?Overall Cognitive Status: Within Functional Limits for tasks assessed ?  ?  ?  ?  ?  ?  ?  ?  ?  ?  ?  ?  ?  ?  ?  ?  ?  ?  ?  ?General Comments    ? ?  ?Exercises   ?  ?Shoulder Instructions    ? ? ?Home Living Family/patient expects to be discharged to:: Private residence ?Living Arrangements: Alone ?Available Help at Discharge: Family;Available PRN/intermittently ?Type of Home: Apartment Lawyer) ?Home Access: Elevator ?  ?  ?Home Layout: One level ?  ?  ?Bathroom Shower/Tub: Tub/shower unit ?  ?Bathroom Toilet: Standard ?  ?  ?Home Equipment: None ?  ?  ?  ? ?  ?Prior Functioning/Environment Prior Level of Function : Independent/Modified Independent ?  ?  ?  ?  ?  ?  ?Mobility Comments: no device ?ADLs Comments: independent ?  ? ?  ?  ?OT Problem List: Pain;Decreased range of motion;Impaired balance (sitting and/or standing) ?  ?   ?OT Treatment/Interventions: Self-care/ADL training;Patient/family education;Balance training;Therapeutic activities  ?  ?OT Goals(Current goals can be found in the care plan section) Acute Rehab OT Goals ?Patient Stated Goal: to go back to gym ?OT Goal Formulation: With patient ?Time For Goal Achievement: 09/17/21 ?Potential to Achieve Goals: Good  ?OT Frequency: Min 2X/week ?  ? ?Co-evaluation   ?  ?  ?  ?  ? ?  ?AM-PAC OT "6 Clicks" Daily Activity     ?Outcome Measure Help from another person eating meals?: None ?Help from another person taking care of personal grooming?: A Little ?Help from another person toileting, which includes using toliet, bedpan, or urinal?: A Little ?Help from another person bathing (including washing, rinsing, drying)?: A Little ?Help from another person to  put on and taking off regular upper body clothing?: A Little ?Help from another person to put on and taking off regular lower body clothing?: A Little ?6 Click Score: 19 ?  ?End of Session Equipment Utilized During Treatment: Rolling walker (2 wheels) ?Nurse Communication: Mobility status (need post op shoe) ? ?Activity Tolerance: Patient tolerated treatment well ?Patient left: in chair;with call bell/phone within reach ? ?OT Visit Diagnosis: Other abnormalities of gait and mobility (R26.89);Pain  ?              ?Time: 7591-6384 ?OT Time Calculation (min): 29 min ?Charges:  OT General Charges ?$OT Visit: 1 Visit ?OT Evaluation ?$OT Eval Low Complexity: 1 Low ?OT Treatments ?$Self Care/Home Management : 8-22 mins ? ?Ellisha Bankson, OTR/L ?Acute Care Rehab Services  ?Office [phone] ?Pager: [phone]  ? ?Myrick Mcnairy L Fiorella Hanahan ?09/03/2021, 4:09 PM ?

## 2021-09-03 NOTE — Progress Notes (Signed)
OT Cancellation Note ? ?Patient Details ?Name: Joanne Carlson ?MRN: [id number] ?DOB: November 01, 1945 ? ? ?Cancelled Treatment:    Reason Eval/Treat Not Completed: Patient at procedure or test/ unavailable (HD) will attempt later time. ? ?Lidia Clavijo,HILLARY ?09/03/2021, 7:55 AM ?Maurie Boettcher, OT/L  ? ?Acute OT Clinical Specialist ?Acute Rehabilitation Services ?Pager [phone] ?Office [phone]  ?

## 2021-09-03 NOTE — Progress Notes (Addendum)
Antibiotic Therapy Plan ? ? ?Antibiotics : Vancomycin 750 mg with each HD session TThSa + Cefepime 2 gm with each HD session TThSa ? ?Stop date: 09/17/21 ? ?No formal OPAT will be placed since patient will receive antibiotics at her HD center. This plan has been communicated with nephrology. ? ?Jimmy Footman, PharmD, BCPS, BCIDP ?Infectious Diseases Clinical Pharmacist ?Phone: [phone] ?09/03/2021 11:24 AM ? ?

## 2021-09-03 NOTE — Progress Notes (Signed)
PROGRESS NOTE    Joanne Carlson  TGY:[id number] DOB: 08/08/1945 DOA: 08/29/2021 PCP: Vernie Shanks, MD    Chief Complaint  Patient presents with   toe swelling    Brief Narrative:  Joanne Carlson is a 76 y.o. female with medical history significant of PAD s/p DES bilaterally on aspirin and plavix, ESRD HD TTS, Type 2 DM, bladder cancer s/p TURBT, HLD, iron deficiency anemia who presents with worsening non-healing ulcer of left great toe.  MRI done and shows osteomyelitis.  Podiatry consulted/cardiology (vascular) consulted/nephrology consulted.  Plan for partial hallus amputation this morning per podiatry.  ID consultation pending for antibiotic recommendations and duration as well.    Assessment & Plan:  Principal Problem:   Osteomyelitis ([place]) Active Problems:   Type 2 diabetes mellitus without complication, with long-term current use of insulin (HCC)   End stage renal disease (HCC)   Bladder tumor   PVD (peripheral vascular disease) ([place])   Cellulitis   Medication monitoring encounter    Assessment and Plan: * Osteomyelitis ([place]) -Has known PAD followed by cardiology Dr. Gwenlyn Found. Last peripheral angiogram on 03/03/2020 revealing minimal disease in the mid and distal right SFA in the 50% range, patent left external iliac artery stents with high-grade segmental calcified proximal mid left SFA stenosis.  She underwent shockwave intravascular lithotripsy and drug-coated balloon angioplasty with good results. 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   -IV Zosyn was discontinued preoperatively in hopes of cultures being obtained during surgery.  Surgical path obtained and pending.   -Dr.  Gwenlyn Found: s/p Successful Cutting Balloon angioplasty followed by [facility] of 99% P2 segment left popliteal artery stenosis. -Patient seen in consultation by ID who are recommending continuation of vancomycin and cefepime with HD for 14 days post OR date on 09/02/2021 with outpatient follow-up in the ID clinic.   -Patient also need follow-up in podiatry clinic.   -PT OT consulted.Marland Kitchen    PVD (peripheral vascular disease) ([place]) -History of bilateral SFA stent and follows with cardiology Dr. Gwenlyn Found -- Continue DAPT with aspirin and Plavix. -Outpatient follow-up with Dr. Gwenlyn Found  Bladder tumor - S/p TURBT on 07/22/2021. -Outpatient follow-up with urology.  End stage renal disease ([place]) -had HD 09/01/2021.  Creatinine at 7.18 this morning. -Patient on HD TTS. -Patient underwent hemodialysis this morning. -Status post IV iron during hemodialysis. -Nephrology consulted and following.  Type 2 diabetes mellitus without complication, with long-term current use of insulin (HCC) Controlled.  Hemoglobin A1c of 5.7 (07/15/2021) -CBG 89 this morning.   -Diet liberalized to regular diet as patient with poor oral intake per dietitian. -Follow.         DVT prophylaxis: Resume heparin.   Code Status: Full Family Communication: Updated patient.  No family at bedside. Disposition: TBD  Status is: Inpatient Remains inpatient appropriate because: Severity of           Consultants:  Podiatry: Dr. Jacqualyn Posey 08/30/2021 Cardiology: Dr.Berry 08/31/2021 Nephrology: Dr. Hollie Salk 08/31/2021 ID: [email or web address] 09/03/2021  Procedures:  Plain films of the left foot 08/29/2021 MRI left foot 08/30/2021 ABI with TBI 08/30/2021 Abdominal aortogram/bilateral iliac angiogram with left lower extremity angiography with runoff with Cutting Balloon angioplasty followed [facility] left P2 segment of  popliteal artery per Dr. Gwenlyn Found 08/31/2021 Great toe amputation per podiatry, Dr. Boneta Lucks 09/02/2021   Antimicrobials:  -IV vancomycin  08/29/2021>>>>  IV Zosyn 08/29/2021>>>> 09/02/2021 IV cefepime 09/03/2021>>>>   Subjective: Patient sitting up in bed.  Just returned from hemodialysis.  Denies any chest pain.  No shortness of breath.   Objective: Vitals:   09/03/21 1000 09/03/21 1030 09/03/21 1100 09/03/21 1134  BP: (!) 146/49 (!) 116/48 (!) 127/45 (!) 123/49  Pulse: 73 71 68 74  Resp: 16 20 18 18   Temp:   (!) 97.1 F (36.2 C) 98 F (36.7 C)  TempSrc:   Temporal Oral  SpO2: 100% 100% 100% 90%  Weight:   73 kg   Height:        Intake/Output Summary (Last 24 hours) at 09/03/2021 1658 Last data filed at 09/03/2021 1611 Gross per 24 hour  Intake 340 ml  Output 1000 ml  Net -660 ml   Filed Weights   09/02/21 1146 09/03/21 0732 09/03/21 1100  Weight: 72.6 kg 74 kg 73 kg    Examination:  General exam: NAD Respiratory system: CTA B.  No wheezes, no crackles, no rhonchi.  Normal respiratory effort.  Speaking in full sentences.   Cardiovascular system: Regular rate and rhythm no murmurs rubs or gallops.  No JVD.  No lower extremity edema.  Gastrointestinal system: Abdomen is soft, nontender, nondistended, positive bowel sounds.  No rebound.  No guarding. Central nervous system: Alert and oriented. No focal neurological deficits. Extremities: Postop bandage noted on left foot.  Skin: No rashes, lesions or ulcers Psychiatry: Judgement and insight appear normal. Mood & affect appropriate.     Data Reviewed:   CBC: Recent Labs  Lab 08/29/21 1719 08/30/21 0023 08/31/21 0324 09/01/21 0209 09/02/21 0855 09/03/21 0303  WBC 13.6* 12.5* 13.4* 12.7* 13.8* 13.5*  NEUTROABS 10.6*  --   --   --  10.0* 9.9*  HGB 9.1* 8.4* 7.9* 8.1* 8.3* 7.7*  HCT 29.6* 28.1* 25.9* 25.8* 27.9* 25.5*  MCV 95.8 96.9 95.2 93.5 95.2 94.4  PLT 380 368 344 356 399 700    Basic Metabolic Panel: Recent Labs  Lab 08/30/21 0023 08/31/21 0324 09/01/21 0209 09/02/21 0855 09/03/21 0303  NA 138 138 135 137 135  K 3.6 4.0 4.2 4.3 4.6  CL  94* 96* 92* 98 97*  CO2 32 29 26 25 26   GLUCOSE 112* 145* 130* 90 105*  BUN 19 39* 46* 24* 31*  CREATININE 4.01* 6.72* 8.02* 5.91* 7.18*  CALCIUM 8.0* 7.6* 7.8* 8.3* 7.6*  PHOS  --   --  4.1 3.6 4.0    GFR: Estimated Creatinine Clearance: 6.5 mL/min (A) (by C-G formula based on SCr of 7.18 mg/dL (H)).  Liver Function Tests: Recent Labs  Lab 08/29/21 1719 09/02/21 0855 09/03/21 0303  AST 34  --   --   ALT 23  --   --   ALKPHOS 94  --   --   BILITOT 0.5  --   --   PROT 7.7  --   --   ALBUMIN 2.6* 2.1* 1.8*    CBG: Recent Labs  Lab 09/02/21 1112 09/02/21 1148 09/03/21 0702 09/03/21 1152 09/03/21 1544  GLUCAP 67* 135* 89 94 141*     Recent Results (from the past 240 hour(s))  Blood culture (routine x 2)     Status: None   Collection Time: 08/29/21  5:19 PM   Specimen: BLOOD  Result Value Ref Range Status  Specimen Description   Final    BLOOD BLOOD LEFT ARM Performed at [facility] [address] [place], [place] [address]    Special Requests   Final    BOTTLES DRAWN AEROBIC AND ANAEROBIC Blood Culture adequate volume Performed at [facility] [address] [place], [place] [address]    Culture   Final    NO GROWTH 5 DAYS Performed at [facility], [place] [address] [place], [place] [address]    Report Status 09/03/2021 FINAL  Final  Resp Panel by RT-PCR (Flu A&B, Covid) Nasopharyngeal Swab     Status: None   Collection Time: 08/29/21  5:19 PM   Specimen: Nasopharyngeal Swab; Nasopharyngeal(NP) swabs in vial transport medium  Result Value Ref Range Status   SARS Coronavirus 2 by RT PCR NEGATIVE NEGATIVE Final    Comment: (NOTE) SARS-CoV-2 target nucleic acids are NOT DETECTED.  The SARS-CoV-2 RNA is generally detectable in upper respiratory specimens during the acute phase of infection. The lowest concentration of SARS-CoV-2 viral copies this assay can detect is 138 copies/mL. A negative result does not  preclude SARS-Cov-2 infection and should not be used as the sole basis for treatment or other patient management decisions. A negative result may occur with  improper specimen collection/handling, submission of specimen other than nasopharyngeal swab, presence of viral mutation(s) within the areas targeted by this assay, and inadequate number of viral copies(<138 copies/mL). A negative result must be combined with clinical observations, patient history, and epidemiological information. The expected result is Negative.  Fact Sheet for Patients:  EntrepreneurPulse.com.au  Fact Sheet for Healthcare Providers:  IncredibleEmployment.be  This test is no t yet approved or cleared by the [place] FDA and  has been authorized for detection and/or diagnosis of SARS-CoV-2 by FDA under an Emergency Use Authorization (EUA). This EUA will remain  in effect (meaning this test can be used) for the duration of the COVID-19 declaration under Section 564(b)(1) of the Act, 21 U.S.C.section 360bbb-3(b)(1), unless the authorization is terminated  or revoked sooner.       Influenza A by PCR NEGATIVE NEGATIVE Final   Influenza B by PCR NEGATIVE NEGATIVE Final    Comment: (NOTE) The Xpert Xpress SARS-CoV-2/FLU/RSV plus assay is intended as an aid in the diagnosis of influenza from Nasopharyngeal swab specimens and should not be used as a sole basis for treatment. Nasal washings and aspirates are unacceptable for Xpert Xpress SARS-CoV-2/FLU/RSV testing.  Fact Sheet for Patients: EntrepreneurPulse.com.au  Fact Sheet for Healthcare Providers: IncredibleEmployment.be  This test is not yet approved or cleared by the [place] FDA and has been authorized for detection and/or diagnosis of SARS-CoV-2 by FDA under an Emergency Use Authorization (EUA). This EUA will remain in effect (meaning this test can be used) for the  duration of the COVID-19 declaration under Section 564(b)(1) of the Act, 21 U.S.C. section 360bbb-3(b)(1), unless the authorization is terminated or revoked.  Performed at [facility], [place] [address], [place], [place] [address]   Blood culture (routine x 2)     Status: None   Collection Time: 08/29/21 10:30 PM   Specimen: BLOOD  Result Value Ref Range Status   Specimen Description BLOOD SITE NOT SPECIFIED  Final   Special Requests   Final    BOTTLES DRAWN AEROBIC AND ANAEROBIC Blood Culture adequate volume   Culture   Final    NO GROWTH 5 DAYS Performed at [facility], [address] [place], [place] [address]    Report Status 09/03/2021 FINAL  Final  Surgical pcr screen     Status: None   Collection Time: 09/02/21  5:14 AM   Specimen: Nasal Mucosa; Nasal Swab  Result Value Ref Range Status   MRSA, PCR NEGATIVE NEGATIVE Final   Staphylococcus aureus NEGATIVE NEGATIVE Final    Comment: (NOTE) The Xpert SA Assay (FDA approved for NASAL specimens in patients 26 years of age and older), is one component of a comprehensive surveillance program. It is not intended to diagnose infection nor to guide or monitor treatment. Performed at [facility], [place] [address] [place], [place] [address]          Radiology Studies: DG Foot Complete Left  Result Date: 09/02/2021 CLINICAL DATA:  Status post toe amputation. EXAM: LEFT FOOT - COMPLETE 3+ VIEW COMPARISON:  August 29, 2021. FINDINGS: Status post surgical amputation of the first proximal and distal phalanges. Vascular calcifications are noted. No other bony abnormality is noted. IMPRESSION: Status post surgical amputation of first proximal and distal phalanges. Electronically Signed   By: Marijo Conception M.D.   On: 09/02/2021 15:00        Scheduled Meds:  aspirin EC  81 mg Oral Daily   atorvastatin  80 mg Oral Daily   calcium acetate  667 mg Oral TID WC   Chlorhexidine Gluconate Cloth  6  each Topical Q0600   cinacalcet  60 mg Oral Q supper   clopidogrel  75 mg Oral Q breakfast   doxercalciferol  2 mcg Intravenous Q T,Th,Sa-HD   gabapentin  100 mg Oral BID   heparin injection (subcutaneous)  5,000 Units Subcutaneous Q8H   multivitamin  1 tablet Oral QHS   [START ON 09/04/2021] nutrition supplement (JUVEN)  1 packet Oral BID BM   sodium chloride flush  3 mL Intravenous Q12H   Continuous Infusions:  sodium chloride     sodium chloride     sodium chloride     sodium chloride 10 mL/hr at 09/02/21 1217   [START ON 09/05/2021] ceFEPime (MAXIPIME) IV     ferric gluconate (FERRLECIT) IVPB Stopped (09/03/21 1142)   vancomycin Stopped (09/03/21 1143)     LOS: 5 days    Time spent: 35 minutes    Irine Seal, MD [facility]   To contact the attending provider between 7A-7P or the covering provider during after hours 7P-7A, please log into the web site [email or web address] and access using universal [place] password for that web site. If you do not have the password, please call the hospital operator.  09/03/2021, 4:58 PM

## 2021-09-03 NOTE — Consult Note (Addendum)
I have seen and examined the patient. I have personally reviewed the clinical findings, laboratory findings, microbiological data and imaging studies. The assessment and treatment plan was discussed with the Nurse Practitioner Janene Madeira.  I agree with her/his recommendations except following additions/corrections.  Non healing left great toe wound/Wet gangrene/osteomyelitis in the setting of severe PAD of lower extremities s/p vascular intervention ( Balloon angioplasty followed by [facility] left P2 segment of popliteal artery 08/31/21) s/p left great toe amputation at the MTP joint. OR note "  proximal margin of the bone appeared clear of osteomyelitis". Surgical path sent, no OR cultures.   ESRD on HD DM  Lab/Imaging/Microbiological date reviewed  Continue vancomycin and cefepime with HD for 14 days post OR date on 3/1 Monitor CBC, BMP and Vancomycin trough  Will follow in clinic in 2 weeks  ID will sign off. Please call with questions   Rosiland Oz, MD Infectious Disease Physician [facility] [address] [place], [place] [address] Phone: [phone]   Fax: [facility] Infectious Disease    Date of Admission:  08/29/2021     Total days of antibiotics 4   Vancomycin + zosyn             Reason for Consult: Osteomyelitis great toe     Referring Provider: Grandville Silos  Primary Care Provider: Vernie Shanks, MD    Assessment: Joanne Carlson is a 76 y.o. diabetic, dialysis dependent female with known PAD s/p DES placement here for management of great toe osteomyelitis in the setting of long standing dry gangrene changes. Bone pathology is pending. Unfortunately no cultures available to guide treatment. Would continue on Vancomycin + cefepime with HD sessions for now pending more information.   Will work with nephrology to set up these infusions. She is in agreement with this plan.  OP follow up  with Dr. West Bali scheduled to see how she is doing 4 weeks in. Friday 3/31 @ 10:30 am.    Plan: Vancomycin + Cefepime post HD FU  entered on D/C navigator.    Principal Problem:   Osteomyelitis ([place]) Active Problems:   Type 2 diabetes mellitus without complication, with long-term current use of insulin (HCC)   End stage renal disease (HCC)   Bladder tumor   PVD (peripheral vascular disease) (HCC)    amoxicillin-clavulanate  500 mg Oral Q24H   aspirin EC  81 mg Oral Daily   atorvastatin  80 mg Oral Daily   calcium acetate  667 mg Oral TID WC   Chlorhexidine Gluconate Cloth  6 each Topical Q0600   cinacalcet  60 mg Oral Q supper   clopidogrel  75 mg Oral Q breakfast   doxercalciferol  2 mcg Intravenous Q T,Th,Sa-HD   gabapentin  100 mg Oral BID   sodium chloride flush  3 mL Intravenous Q12H    HPI: Joanne Carlson is a 76 y.o. female admitted from home for concern over toe swelling.   PMHx notable for PAD s/p DES bilaterally on antiplatelet therapy, ESRD on HD TTS, T2DM, bladder cancer s/p TURBT, HLD, IDA.   Established care with [facility] in September 2022 to help with management of left hallux dry gangrene ulceration.  Last angiogram with Dr. Gwenlyn Found 03/03/2020 with minimal disease in mid and distal right SFA, < 50%.  MRI 08/30/21 - osteomyelitis of the remaining distal phalanx of the great toe c/w  osteomyelitis with surrounding soft tissue edema. No abscess.   Went to OR with Dr. Posey Pronto from podiatry on 09/02/2021 to undergo amputation of great toe on the left. Bone margins sent to pathology with pending results. No cultures to help guide therapy.   Throughout hospital stay she has been afebrile with mild leukocytosis 12-13.5K. Normal hemodynamics for the patient. She has been on vancomycin + zosyn since surgery.   Review of Systems: Review of Systems  Constitutional:  Negative for chills, fever, malaise/fatigue and weight loss.  HENT:  Negative for sore throat.    Respiratory:  Negative for cough, sputum production and shortness of breath.   Cardiovascular: Negative.   Gastrointestinal:  Negative for abdominal pain, diarrhea and vomiting.  Musculoskeletal:  Negative for joint pain, myalgias and neck pain.  Skin:  Negative for rash.  Neurological:  Negative for headaches.  Psychiatric/Behavioral:  Negative for depression and substance abuse. The patient is not nervous/anxious.    Past Medical History:  Diagnosis Date   Arthritis    "legs, knees; not bad" (01/30/2018)   Dizziness    occasionally   ESRD (end stage renal disease) ([place])    [name]; TTS" (01/30/2018)   History of colon polyps    History of gout    takes Allopurinol daily (01/30/2018)   Hyperlipidemia    takes Pravastatin daily   Hypertension    takes Monopril daily   Joint pain    PAD (peripheral artery disease) ([place])    Peripheral neuropathy    Peripheral vascular disease ([place])    Pre-diabetes    Refusal of blood transfusions as patient is Jehovah's Witness    NO BLOOD OR BLOOD PRODUCTS. Albumin okay.    Stroke [facility]) 1990s   "mini stroke; left lower mouth a little bit twisted since" (01/30/2018)   Past Surgical History:  Procedure Laterality Date   ABDOMINAL AORTOGRAM W/LOWER EXTREMITY N/A 01/30/2018   Procedure: ABDOMINAL AORTOGRAM W/LOWER EXTREMITY;  Surgeon: Lorretta Harp, MD;  Location: [place] [facility];  Service: Cardiovascular;  Laterality: N/A;   ABDOMINAL AORTOGRAM W/LOWER EXTREMITY N/A 03/03/2020   Procedure: ABDOMINAL AORTOGRAM W/LOWER EXTREMITY;  Surgeon: Lorretta Harp, MD;  Location: [place] [facility];  Service: Cardiovascular;  Laterality: N/A;   ABDOMINAL AORTOGRAM W/LOWER EXTREMITY N/A 08/31/2021   Procedure: ABDOMINAL AORTOGRAM W/LOWER EXTREMITY;  Surgeon: Lorretta Harp, MD;  Location: [place] [facility];  Service: Cardiovascular;  Laterality: N/A;   AMPUTATION Left 09/02/2021   Procedure: AMPUTATION GREAT TOE;  Surgeon: Felipa Furnace, DPM;   Location: [facility];  Service: Podiatry;  Laterality: Left;   APPENDECTOMY     BASCILIC VEIN TRANSPOSITION Right 10/03/2013   Procedure: BRACHIOCEPHALIC Arteriovenous Fistula ;  Surgeon: Mal Misty, MD;  Location: [place];  Service: Vascular;  Laterality: Right;   [place] Right 11/17/2016   Procedure: BASCILIC VEIN TRANSPOSITION- RIGHT SINGLE STAGE;  Surgeon: Conrad [place], MD;  Location: [place];  Service: Vascular;  Laterality: Right;   COLONOSCOPY     CYST EXCISION  1980's   cyst removed from lower abdomen   DILATION AND CURETTAGE OF UTERUS  1987   ESOPHAGOGASTRODUODENOSCOPY     PERIPHERAL VASCULAR BALLOON ANGIOPLASTY Right 01/30/2018   Procedure: PERIPHERAL VASCULAR BALLOON ANGIOPLASTY;  Surgeon: Lorretta Harp, MD;  Location: [place] [facility];  Service: Cardiovascular;  Laterality: Right;  superficial femoral   PERIPHERAL VASCULAR BALLOON ANGIOPLASTY Left 08/31/2021   Procedure: PERIPHERAL VASCULAR BALLOON ANGIOPLASTY;  Surgeon: [name]  J, MD;  Location: [place] [facility];  Service: Cardiovascular;  Laterality: Left;   PERIPHERAL VASCULAR INTERVENTION  03/03/2020   Procedure: PERIPHERAL VASCULAR INTERVENTION;  Surgeon: Lorretta Harp, MD;  Location: [place] [facility];  Service: Cardiovascular;;  external iliac stent left sfa lithotripsy/ drug coated balloon   SHOULDER ARTHROSCOPY W/ ROTATOR CUFF REPAIR Right 2013   TRANSURETHRAL RESECTION OF BLADDER TUMOR N/A 07/22/2021   Procedure: TRANSURETHRAL RESECTION OF BLADDER TUMOR (TURBT);  Surgeon: Vira Agar, MD;  Location: [facility];  Service: Urology;  Laterality: N/A;   TUBAL LIGATION  1986   UPPER EXTREMITY VENOGRAPHY Bilateral 11/08/2016   Procedure: Bilateral Upper Extremity Venography;  Surgeon: Conrad [place], MD;  Location: [place] [facility];  Service: Cardiovascular;  Laterality: Bilateral;     Social History   Tobacco Use   Smoking status: Former    Packs/day: 0.25    Years: 10.00    Pack  years: 2.50    Types: Cigarettes    Quit date: 1970    Years since quitting: 53.2   Smokeless tobacco: Never   Tobacco comments:    "never a heavy smoker; smoked off and on"  Vaping Use   Vaping Use: Never used  Substance Use Topics   Alcohol use: Not Currently    Comment: "in my younger days"   Drug use: Not Currently    Types: Marijuana    Comment: "weed in my teens"    Family History  Problem Relation Age of Onset   Depression Mother    Hypertension Mother    Depression Father    Depression Sister    Hypertension Sister    Allergies  Allergen Reactions   Tylenol [Acetaminophen] Rash    OBJECTIVE: Blood pressure (!) 140/54, pulse 79, temperature 97.8 F (36.6 C), temperature source Temporal, resp. rate 15, height _0  (1.626 m), weight 74 kg, SpO2 100 %.  Physical Exam Vitals reviewed.  Constitutional:      Appearance: Normal appearance. She is not ill-appearing.     Comments: Examined on HD. No distress. Tired. Coughing dryly intermittently.   HENT:     Mouth/Throat:     Mouth: Mucous membranes are moist.     Pharynx: Oropharynx is clear.  Eyes:     General: No scleral icterus. Cardiovascular:     Rate and Rhythm: Normal rate.  Pulmonary:     Effort: Pulmonary effort is normal.  Musculoskeletal:     Comments: LLE wrapped with clean/dry compression wrap.   Skin:    General: Skin is warm and dry.     Comments: RUE AV access normal appearing without tenderness.   Neurological:     Mental Status: She is oriented to person, place, and time.  Psychiatric:        Mood and Affect: Mood normal.        Thought Content: Thought content normal.    Lab Results Lab Results  Component Value Date   WBC 13.5 (H) 09/03/2021   HGB 7.7 (L) 09/03/2021   HCT 25.5 (L) 09/03/2021   MCV 94.4 09/03/2021   PLT 321 09/03/2021    Lab Results  Component Value Date   CREATININE 7.18 (H) 09/03/2021   BUN 31 (H) 09/03/2021   NA 135 09/03/2021   K 4.6 09/03/2021   CL 97  (L) 09/03/2021   CO2 26 09/03/2021    Lab Results  Component Value Date   ALT 23 08/29/2021   AST 34 08/29/2021  ALKPHOS 94 08/29/2021   BILITOT 0.5 08/29/2021     Microbiology: Recent Results (from the past 240 hour(s))  Blood culture (routine x 2)     Status: None (Preliminary result)   Collection Time: 08/29/21  5:19 PM   Specimen: BLOOD  Result Value Ref Range Status   Specimen Description   Final    BLOOD BLOOD LEFT ARM Performed at [facility] [address] [place], [place] [address]    Special Requests   Final    BOTTLES DRAWN AEROBIC AND ANAEROBIC Blood Culture adequate volume Performed at [facility] [address] [place], [place] [address]    Culture   Final    NO GROWTH 4 DAYS Performed at [facility], [place] [address], [place], [place] [address]    Report Status PENDING  Incomplete  Resp Panel by RT-PCR (Flu A&B, Covid) Nasopharyngeal Swab     Status: None   Collection Time: 08/29/21  5:19 PM   Specimen: Nasopharyngeal Swab; Nasopharyngeal(NP) swabs in vial transport medium  Result Value Ref Range Status   SARS Coronavirus 2 by RT PCR NEGATIVE NEGATIVE Final    Comment: (NOTE) SARS-CoV-2 target nucleic acids are NOT DETECTED.  The SARS-CoV-2 RNA is generally detectable in upper respiratory specimens during the acute phase of infection. The lowest concentration of SARS-CoV-2 viral copies this assay can detect is 138 copies/mL. A negative result does not preclude SARS-Cov-2 infection and should not be used as the sole basis for treatment or other patient management decisions. A negative result may occur with  improper specimen collection/handling, submission of specimen other than nasopharyngeal swab, presence of viral mutation(s) within the areas targeted by this assay, and inadequate number of viral copies(<138 copies/mL). A negative result must be combined with clinical observations, patient history, and  epidemiological information. The expected result is Negative.  Fact Sheet for Patients:  EntrepreneurPulse.com.au  Fact Sheet for Healthcare Providers:  IncredibleEmployment.be  This test is no t yet approved or cleared by the [place] FDA and  has been authorized for detection and/or diagnosis of SARS-CoV-2 by FDA under an Emergency Use Authorization (EUA). This EUA will remain  in effect (meaning this test can be used) for the duration of the COVID-19 declaration under Section 564(b)(1) of the Act, 21 U.S.C.section 360bbb-3(b)(1), unless the authorization is terminated  or revoked sooner.       Influenza A by PCR NEGATIVE NEGATIVE Final   Influenza B by PCR NEGATIVE NEGATIVE Final    Comment: (NOTE) The Xpert Xpress SARS-CoV-2/FLU/RSV plus assay is intended as an aid in the diagnosis of influenza from Nasopharyngeal swab specimens and should not be used as a sole basis for treatment. Nasal washings and aspirates are unacceptable for Xpert Xpress SARS-CoV-2/FLU/RSV testing.  Fact Sheet for Patients: EntrepreneurPulse.com.au  Fact Sheet for Healthcare Providers: IncredibleEmployment.be  This test is not yet approved or cleared by the [place] FDA and has been authorized for detection and/or diagnosis of SARS-CoV-2 by FDA under an Emergency Use Authorization (EUA). This EUA will remain in effect (meaning this test can be used) for the duration of the COVID-19 declaration under Section 564(b)(1) of the Act, 21 U.S.C. section 360bbb-3(b)(1), unless the authorization is terminated or revoked.  Performed at [facility] [place] [address] [place], [place] [address]   Blood culture (routine x 2)     Status: None (Preliminary result)   Collection Time: 08/29/21 10:30 PM   Specimen: BLOOD  Result  Value Ref Range Status   Specimen Description BLOOD SITE NOT SPECIFIED  Final    Special Requests   Final    BOTTLES DRAWN AEROBIC AND ANAEROBIC Blood Culture adequate volume   Culture   Final    NO GROWTH 4 DAYS Performed at [facility], [address], [place], [place] [address]    Report Status PENDING  Incomplete  Surgical pcr screen     Status: None   Collection Time: 09/02/21  5:14 AM   Specimen: Nasal Mucosa; Nasal Swab  Result Value Ref Range Status   MRSA, PCR NEGATIVE NEGATIVE Final   Staphylococcus aureus NEGATIVE NEGATIVE Final    Comment: (NOTE) The Xpert SA Assay (FDA approved for NASAL specimens in patients 35 years of age and older), is one component of a comprehensive surveillance program. It is not intended to diagnose infection nor to guide or monitor treatment. Performed at [facility], [place] [address] [place], [place] [address]    Imaging MR FOOT LEFT WO CONTRAST  Result Date: 08/30/2021 CLINICAL DATA:  Foot swelling, diabetic, osteomyelitis suspected, xray donePatient reports toes on left foot began bothering her in December, left lower leg, ankle swelling/red. Patient went to UC today and was told she has gangrene, foul odor. Pain rated 4/10 EXAM: MRI OF THE LEFT FOOT WITHOUT CONTRAST TECHNIQUE: Multiplanar, multisequence MR imaging of the left foot was performed. No intravenous contrast was administered. COMPARISON:  Left foot radiographs, 08/29/2021 and 04/20/2017. FINDINGS: Bones/Joint/Cartilage The distal phalanx of the great toe is truncated, which may be from resorption, prior surgery or a combination. There is abnormal signal from the great toe distal phalanx, decreased in T1 and hyperintense on T2, with portions of the low signal intensity cortical line disrupted, predominately along the lateral margin. A small focus of decreased T1 signal is seen in the subchondral bone along the lateral margin of the head of the proximal phalanx of the great toe, without overlying cortical resorption, felt to be degenerative in origin. There  is more diffuse increased T2 signal in the distal aspect of the great toe proximal phalanx. There are no other areas of abnormal bone marrow signal. No joint effusion. Ligaments Intact. Muscles and Tendons Increased T2 signal noted throughout the intrinsic foot musculature. Tendons normal in signal and thickness. Soft tissues Diffuse soft tissue edema, most evident involving the great toe. No fluid collections to suggest an abscess. IMPRESSION: 1. Abnormal signal from the remaining distal phalanx of the great toe consistent with osteomyelitis. 2. No other evidence of osteomyelitis. Increased T2 signal within the distal aspect of the proximal phalanx of the great toe, consistent with reactive edema. 3. Great toe soft tissue edema with a likely component of cellulitis. No evidence of an abscess. 4. More diffuse soft tissue and intrinsic foot muscle edema. Electronically Signed   By: Lajean Manes M.D.   On: 08/30/2021 10:08   PERIPHERAL VASCULAR CATHETERIZATION  Result Date: 08/31/2021 Images from the original result were not included.  [id number] LOCATION:  FACILITY: [place] [facility] PHYSICIAN: [name], M.D. 1946-03-22 DATE OF PROCEDURE:  08/31/2021 DATE OF DISCHARGE: PV Angiogram/Intervention History obtained from chart review.JAKAILA NORMENT is a 76 y.o. female with a hx of PVD, ESRD on HD, DM, HLD, HTN, bladder CA s/p TURBT and mild AS who is being seen 08/31/2021 for the evaluation of osteomyelitis, PVD  at the request of Dr. Eliseo Squires.  I have performed right and left SFA intervention in the past for critical ischemia resulting  in wound healing.  She was admitted with a nonhealing wound of her left great toe with evidence of osteomyelitis.  She is on hemodialysis scheduled for HD tomorrow.  She presents now for peripheral angiography potential intervention for limb salvage. Pre Procedure Diagnosis: Critical ischemia/PAD Post Procedure Diagnosis: Critical ischemia/PAD Operators: Dr. [name] Procedures Performed:  1.   Ultrasound-guided right common femoral access  2.  Abdominal aortogram/bilateral iliac angiogram  3.  Contralateral access (second-order catheter placement)  4.  Left lower extremity angiography with runoff             5.  Score flex Cutting Balloon angioplasty followed by DCB left P2 segment of popliteal artery PROCEDURE DESCRIPTION: The patient was brought to the second floor  Cardiac cath lab in the the postabsorptive state. She was premedicated with IV Versed and fentanyl. Her right groin was prepped and shaved in usual sterile fashion. Xylocaine 1% was used for local anesthesia. A 5 French sheath was inserted into the right common femoral artery using standard Seldinger technique.  Ultrasound was used to identify the right common femoral artery and guide access.  A digital copy was captured and placed the patient's chart.  A 5 French pigtail catheter was placed in the distal abdominal aorta.  Distal abdominal aortography, bilateral iliac angiography was performed.  Contralateral access was obtained with a 5 [place] crossover catheter and endhole catheter (second-order catheter placement).  Left lower extremity angiography with runoff was performed using bolus chase, digital subtraction and step table technique.  Omnipaque dye was used for the entirety of the case (150 cc total contrast the patient).  Retrograde ordered pressures monitored during the case.  Angiographic Data: 1: Abdominal aorta-widely patent 2: Left lower extremity-left extrailiac artery stent was widely patent.  There were scattered 50 to 70% calcified focal stenoses in the proximal, mid and distal left SFA.  There was a 99% left P2 popliteal segment stenosis with three-vessel runoff. 3: Right lower extremity-the right common, external iliac and common femoral arteries widely patent   Ms. Cordle has critical limb ischemia with scattered calcified focal stenoses in the proximal, mid and distal left SFA as well as a 99% P2 segment  left popliteal artery stenosis probably accounting for her diminished infrapopliteal blood flow and decreased left ABI.  We will proceed with Cutting Balloon angioplasty followed by [facility] Procedure Description: The endhole catheter was exchanged over a Rosen wire for a 6 [place] 55 cm multipurpose Ansell sheath.  Patient received 11,000 units of heparin with an ACT of 299.  Omnipaque dye was used for the entirety of the intervention.  Retrograde pressures monitored during the case. I navigated the left SFA with a 6 g 014 Shepperd wire I was able to cross the popliteal lesion and accessed the anterior tibial artery.  I then performed Cutting Balloon angioplasty with a 3.5 mm x 20 mm long score flex Cutting Balloon at nominal pressures for 2 minutes.  I follow this with a 5 mm x 4 cm Ranger DCB at 4 atm for 3 minutes resulting in reduction of a 99% P2 segment left popliteal artery stenosis to 0% residual with excellent flow.  The patient tolerated the procedure well.  The wire was removed.  The Ansell sheath was removed over an 035 versa core wire and exchanged for a short 6 [place] sheath which was then secured in place.  The patient received 300 mg of p.o. clopidogrel. Final Impression: Successful Cutting Balloon angioplasty followed by [facility] of  99% P2 segment left popliteal artery stenosis in the setting of critical ischemia.  The sheath will be removed once ACT falls below 170 and pressure held.  She will be treated with DAPT as well as ongoing wound care and antibiotic therapy.  She left the lab in stable condition. [name]. MD, [facility] 08/31/2021 4:15 PM    DG Foot Complete Left  Result Date: 09/02/2021 CLINICAL DATA:  Status post toe amputation. EXAM: LEFT FOOT - COMPLETE 3+ VIEW COMPARISON:  August 29, 2021. FINDINGS: Status post surgical amputation of the first proximal and distal phalanges. Vascular calcifications are noted. No other bony abnormality is noted. IMPRESSION: Status post surgical amputation of  first proximal and distal phalanges. Electronically Signed   By: Marijo Conception M.D.   On: 09/02/2021 15:00   DG Foot Complete Left  Result Date: 08/29/2021 CLINICAL DATA:  Pain and swelling EXAM: LEFT FOOT - COMPLETE 3+ VIEW COMPARISON:  04/20/2017 FINDINGS: No recent fracture or dislocation is seen. There is possible previous partial resection of distal portion of distal phalanx of big toe. There is small radiolucency in the proximal shaft of distal phalanx of left big toe. Rest of the bony structures are unremarkable. Arterial calcifications are seen in the soft tissues. Bony spurs seen in the tarsometatarsal joints. Small plantar spur is seen in the calcaneus. Calcific bursitis/tendinosis is seen in the Achilles tendon close to calcaneus. IMPRESSION: There is 6 mm faint radiolucency in the remaining base of distal phalanx of left big toe. Possibility of osteomyelitis is not excluded. Follow-up MRI as clinically warranted should be considered. No recent fracture or dislocation is seen. Other findings as described in the body of the report. Electronically Signed   By: Elmer Picker M.D.   On: 08/29/2021 17:37   VAS [place] ABI WITH/WO TBI  Result Date: 09/01/2021  LOWER EXTREMITY DOPPLER STUDY Patient Name:  Joanne Carlson  Date of Exam:   08/30/2021 Medical Rec #: [id number]         Accession #:    [id number] Date of Birth: [birth year]          Patient Gender: F Patient Age:   86 years Exam Location:  [facility] Procedure:      VAS [place] ABI WITH/WO TBI Referring Phys: CHING TU --------------------------------------------------------------------------------  Indications: Ulceration. High Risk Factors: Hypertension, hyperlipidemia, Diabetes, past history of                    smoking, prior CVA. Other Factors: ESRD(HD), PAD.  Vascular Interventions: LLE angioplasty & stent placement (2021), RLE                         angioplasty & atherectomy (2019). Comparison Study: Previous exam 04/06/21 Performing  Technologist: Rogelia Rohrer RVT, RDMS  Examination Guidelines: A complete evaluation includes at minimum, Doppler waveform signals and systolic blood pressure reading at the level of bilateral brachial, anterior tibial, and posterior tibial arteries, when vessel segments are accessible. Bilateral testing is considered an integral part of a complete examination. Photoelectric Plethysmograph (PPG) waveforms and toe systolic pressure readings are included as required and additional duplex testing as needed. Limited examinations for reoccurring indications may be performed as noted.  ABI Findings: +---------+------------------+-----+----------+---------+  Right     Rt Pressure (mmHg) Index Waveform   Comment    +---------+------------------+-----+----------+---------+  Brachial  HD access  +---------+------------------+-----+----------+---------+  PTA       102                0.87  monophasic            +---------+------------------+-----+----------+---------+  DP        111                0.95  biphasic              +---------+------------------+-----+----------+---------+  Great Toe 165                1.41  Abnormal              +---------+------------------+-----+----------+---------+ +---------+------------------+-----+----------+-------+  Left      Lt Pressure (mmHg) Index Waveform   Comment  +---------+------------------+-----+----------+-------+  Brachial  117                      triphasic           +---------+------------------+-----+----------+-------+  PTA       65                 0.56  monophasic          +---------+------------------+-----+----------+-------+  DP        73                 0.62  monophasic          +---------+------------------+-----+----------+-------+  Great Toe 25                 0.21  Abnormal            +---------+------------------+-----+----------+-------+ +-------+-----------+-----------+------------+------------+  ABI/TBI Today's ABI Today's  TBI Previous ABI Previous TBI  +-------+-----------+-----------+------------+------------+  Right   0.95        1.41        0.94         1.18          +-------+-----------+-----------+------------+------------+  Left    0.62        0.21                   0.60          +-------+-----------+-----------+------------+------------+  Summary: Right: Resting right ankle-brachial index indicates mild right lower extremity arterial disease. The right toe-brachial index is abnormal (falsely elevated - likely due to vessel calcification). Left: Resting left ankle-brachial index indicates moderate left lower extremity arterial disease. The left toe-brachial index is abnormal (severe).  *See table(s) above for measurements and observations.  Electronically signed by Deitra Mayo MD on 09/01/2021 at 1:45:13 PM.    Final     Janene Madeira, MSN, NP-C [facility] [email or web address] Pager: [phone] Office: [phone] [facility] Main Line: [place] Communication Welcome

## 2021-09-03 NOTE — Progress Notes (Signed)
PT Cancellation Note ? ?Patient Details ?Name: LAQUESHIA CIHLAR ?MRN: [id number] ?DOB: 1945-10-14 ? ? ?Cancelled Treatment:    Reason Eval/Treat Not Completed: Patient at procedure or test/unavailable ? ? ?Randi Poullard B Rashee Marschall ?09/03/2021, 9:21 AM ?Ledonna Dormer P, PT ?Acute Rehabilitation Services ?Pager: [phone] ?Office: [phone] ? ? ? ?

## 2021-09-03 NOTE — Evaluation (Signed)
Physical Therapy Evaluation ?Patient Details ?Name: Joanne Carlson ?MRN: [id number] ?DOB: 1946-04-01 ?Today's Date: 09/03/2021 ? ?History of Present Illness ? 76 yo admitted 2/25 with left great toe ulcer with osteomyelitis s/p toe amputation 3/1. PMhx: ESRD on HD TTS, PAD, DM, bladder CA, HLD, anemia  ?Clinical Impression ? Pt admitted secondary to problem above with deficits below. Pt requiring min guard A for mobility tasks initially, however, after short distance, pt reporting increased pain and that she didn't feel well. Required min A for steadying and had to bring chair up behind pt. Feel pt would benefit from further acute PT prior to d/c. Anticipate she will progress well once symptoms improve. Will continue to follow acutely.   ?   ? ?Recommendations for follow up therapy are one component of a multi-disciplinary discharge planning process, led by the attending physician.  Recommendations may be updated based on patient status, additional functional criteria and insurance authorization. ? ?Follow Up Recommendations Home health PT (pending progression) ? ?  ?Assistance Recommended at Discharge Intermittent Supervision/Assistance  ?Patient can return home with the following ? A little help with walking and/or transfers;A little help with bathing/dressing/bathroom;Assistance with cooking/housework;Help with stairs or ramp for entrance;Assist for transportation ? ?  ?Equipment Recommendations Rolling walker (2 wheels);BSC/3in1;Wheelchair (measurements PT);Wheelchair cushion (measurements PT)  ?Recommendations for Other Services ?    ?  ?Functional Status Assessment Patient has had a recent decline in their functional status and demonstrates the ability to make significant improvements in function in a reasonable and predictable amount of time.  ? ?  ?Precautions / Restrictions Precautions ?Precautions: Fall ?Precaution Comments: persistent vaginal bleeding ?Required Braces or Orthoses: Other Brace ?Other Brace:  post op shoe ?Restrictions ?Weight Bearing Restrictions: Yes ?LLE Weight Bearing: Weight bearing as tolerated (in post op shoe)  ? ?  ? ?Mobility ? Bed Mobility ?  ?  ?  ?  ?  ?  ?  ?General bed mobility comments: Sitting in chair ?  ? ?Transfers ?Overall transfer level: Needs assistance ?Equipment used: Rolling walker (2 wheels) ?Transfers: Sit to/from Stand, Bed to chair/wheelchair/BSC ?Sit to Stand: Min guard ?Stand pivot transfers: Min guard ?  ?  ?  ?  ?General transfer comment: Min guard for safety to stand and transfer to/from [facility]. ?  ? ?Ambulation/Gait ?Ambulation/Gait assistance: Min guard, Min assist ?Gait Distance (Feet): 7 Feet ?Assistive device: Rolling walker (2 wheels) ?Gait Pattern/deviations: Step-to pattern, Decreased step length - right, Decreased step length - left, Decreased weight shift to left, Antalgic ?Gait velocity: Decreased ?  ?  ?General Gait Details: Slow, antalgic gait. Initially min guard A, however, with increased distance, pt becoming more unsteady and report she didn't feel well. Fonda chair up behind pt and had her sit down. ? ?Stairs ?  ?  ?  ?  ?  ? ?Wheelchair Mobility ?  ? ?Modified Rankin (Stroke Patients Only) ?  ? ?  ? ?Balance Overall balance assessment: Needs assistance ?Sitting-balance support: Feet supported, No upper extremity supported ?Sitting balance-Leahy Scale: Fair ?  ?  ?Standing balance support: Bilateral upper extremity supported ?Standing balance-Leahy Scale: Poor ?Standing balance comment: Reliant on BUE support ?  ?  ?  ?  ?  ?  ?  ?  ?  ?  ?  ?   ? ? ? ?Pertinent Vitals/Pain Pain Assessment ?Pain Assessment: Faces ?Faces Pain Scale: Hurts even more ?Pain Location: L foot ?Pain Descriptors / Indicators: Grimacing ?Pain Intervention(s): Limited activity within patient's tolerance, Monitored  during session, Repositioned  ? ? ?Home Living Family/patient expects to be discharged to:: Private residence ?Living Arrangements: Alone ?Available Help at Discharge:  Family;Available PRN/intermittently ?Type of Home: Apartment ?Home Access: Elevator ?  ?  ?  ?Home Layout: One level ?Home Equipment: None ?   ?  ?Prior Function Prior Level of Function : Independent/Modified Independent ?  ?  ?  ?  ?  ?  ?Mobility Comments: no device ?ADLs Comments: independent ?  ? ? ?Hand Dominance  ? Dominant Hand: Right ? ?  ?Extremity/Trunk Assessment  ? Upper Extremity Assessment ?Upper Extremity Assessment: Defer to OT evaluation ?  ? ?Lower Extremity Assessment ?Lower Extremity Assessment: LLE deficits/detail ?LLE Deficits / Details: Deficits consistent with post op pain and weakness. ?  ? ?Cervical / Trunk Assessment ?Cervical / Trunk Assessment: Normal  ?Communication  ? Communication: No difficulties  ?Cognition Arousal/Alertness: Awake/alert ?Behavior During Therapy: [facility] for tasks assessed/performed ?Overall Cognitive Status: Within Functional Limits for tasks assessed ?  ?  ?  ?  ?  ?  ?  ?  ?  ?  ?  ?  ?  ?  ?  ?  ?  ?  ?  ? ?  ?General Comments   ? ?  ?Exercises    ? ?Assessment/Plan  ?  ?PT Assessment Patient needs continued PT services  ?PT Problem List Decreased strength;Decreased activity tolerance;Decreased balance;Decreased mobility;Decreased knowledge of use of DME;Pain ? ?   ?  ?PT Treatment Interventions DME instruction;Gait training;Functional mobility training;Therapeutic activities;Therapeutic exercise;Balance training;Wheelchair mobility training;Patient/family education   ? ?PT Goals (Current goals can be found in the Care Plan section)  ?Acute Rehab PT Goals ?Patient Stated Goal: to feel better ?PT Goal Formulation: With patient ?Time For Goal Achievement: 09/17/21 ?Potential to Achieve Goals: Good ? ?  ?Frequency Min 3X/week ?  ? ? ?Co-evaluation   ?  ?  ?  ?  ? ? ?  ?AM-PAC PT "6 Clicks" Mobility  ?Outcome Measure Help needed turning from your back to your side while in a flat bed without using bedrails?: A Little ?Help needed moving from lying on your back to  sitting on the side of a flat bed without using bedrails?: A Little ?Help needed moving to and from a bed to a chair (including a wheelchair)?: A Little ?Help needed standing up from a chair using your arms (e.g., wheelchair or bedside chair)?: A Little ?Help needed to walk in hospital room?: A Lot ?Help needed climbing 3-5 steps with a railing? : A Lot ?6 Click Score: 16 ? ?  ?End of Session Equipment Utilized During Treatment: Gait belt ?Activity Tolerance: Patient limited by fatigue ?Patient left: in chair;with call bell/phone within reach ?Nurse Communication: Mobility status ?PT Visit Diagnosis: Unsteadiness on feet (R26.81);Muscle weakness (generalized) (M62.81);Difficulty in walking, not elsewhere classified (R26.2);Pain ?Pain - Right/Left: Left ?Pain - part of body: Ankle and joints of foot ?  ? ?Time: 4270-6237 ?PT Time Calculation (min) (ACUTE ONLY): 25 min ? ? ?Charges:   PT Evaluation ?$PT Eval Moderate Complexity: 1 Mod ?PT Treatments ?$Therapeutic Activity: 8-22 mins ?  ?   ? ? ?Reuel Derby, PT, DPT  ?Acute Rehabilitation Services  ?Pager: [phone] ?Office: [phone] ? ? ?[place] ?09/03/2021, 4:42 PM ? ?

## 2021-09-03 NOTE — Progress Notes (Signed)
?  [facility] ?Progress Note  ? ?Assessment/ Plan:   ?Dialysis Orders:  ?TTS - [place] ? 3hrs40min, BFR 350, DFR 600,  EDW 73.7kg, 2K/ 2.5Ca ?  ?Heparin 7000 unit bolus with HD ?Mircera 75 mcg q2wks - last 08/20/21 ?Hectorol 40mcg IV qHD- last 08/29/21 ?Venofer 100mg  IV X 5 doses-last dose 08/29/21: Needs 3 more doses ?Home meds: Midodrine 10mg  pre-HD; Sensipar 60mg  daily ?  ?Assessment/Plan: ?Osteomyelitis L great toe: On ABXs; Podiatry and Cardiology following; s/p balloon cutting angioplasty L popliteal artery 08/31/21, s/p partial hallux amputation on Wed 09/02/21.  On vanc/ cefepime ?PVD: History of bilateral SFA stent; ASA and Plavix held for now; as above in #1- had L leg critical limb ischemia ?ESRD - on HD TTS; plan for HD tomorrow per usual schedule ?Hypertension/volume  - Normotensive and euvolemic on exam ?Anemia of CKD - Hgb 7.9; will resume short Fe load (ordered from outpatient setting); ESA recently given on 08/29/21 ?Secondary Hyperparathyroidism -  Will check PO4 in AM; continue Sensipar for now ?Nutrition renal diet/ vits ?Dispo: inpatient ? ?Subjective:   ? ?Seen and examined on HD.  BFR 400 mL/ min, UF goal 2L.  Feeling OK today.  Pain well controlled.    ? ?Objective:   ?BP (!) 123/49 (BP Location: Left Wrist)   Pulse 74   Temp 98 ?F (36.7 ?C) (Oral)   Resp 18   Ht 5\' 4"  (1.626 m)   Wt 73 kg   SpO2 90%   BMI 27.62 kg/m?  ? ?Physical Exam: ?Gen:NAD, sitting in bed ?CVS: RRR ?Resp: clear ?Abd: soft ?Ext: no LE edema, L foot wrapped in sterile dressing ?ACCESS: AVF  ? ?Labs: ?BMET ?Recent Labs  ?Lab 08/29/21 ?1719 08/30/21 ?0023 08/31/21 ?0324 09/01/21 ?2563 09/02/21 ?8937 09/03/21 ?0303  ?NA 135 138 138 135 137 135  ?K 3.4* 3.6 4.0 4.2 4.3 4.6  ?CL 91* 94* 96* 92* 98 97*  ?CO2 34* 32 29 26 25 26   ?GLUCOSE 135* 112* 145* 130* 90 105*  ?BUN 18 19 39* 46* 24* 31*  ?CREATININE 3.85* 4.01* 6.72* 8.02* 5.91* 7.18*  ?CALCIUM 8.2* 8.0* 7.6* 7.8* 8.3* 7.6*  ?PHOS  --   --    --  4.1 3.6 4.0  ? ?CBC ?Recent Labs  ?Lab 08/29/21 ?1719 08/30/21 ?0023 08/31/21 ?0324 09/01/21 ?3428 09/02/21 ?7681 09/03/21 ?0303  ?WBC 13.6*   < > 13.4* 12.7* 13.8* 13.5*  ?NEUTROABS 10.6*  --   --   --  10.0* 9.9*  ?HGB 9.1*   < > 7.9* 8.1* 8.3* 7.7*  ?HCT 29.6*   < > 25.9* 25.8* 27.9* 25.5*  ?MCV 95.8   < > 95.2 93.5 95.2 94.4  ?PLT 380   < > 344 356 399 321  ? < > = values in this interval not displayed.  ? ? ?  ?Medications:   ? ? aspirin EC  81 mg Oral Daily  ? atorvastatin  80 mg Oral Daily  ? calcium acetate  667 mg Oral TID WC  ? Chlorhexidine Gluconate Cloth  6 each Topical Q0600  ? cinacalcet  60 mg Oral Q supper  ? clopidogrel  75 mg Oral Q breakfast  ? doxercalciferol  2 mcg Intravenous Q T,Th,Sa-HD  ? gabapentin  100 mg Oral BID  ? sodium chloride flush  3 mL Intravenous Q12H  ? ? ? ?Madelon Lips MD ?09/03/2021, 12:06 PM   ?

## 2021-09-03 NOTE — Progress Notes (Signed)
Orthopedic Tech Progress Note ?Patient Details:  ?DOLA LUNSFORD ?1946/02/12 ?[id number] ? ?Ortho Devices ?Type of Ortho Device: Postop shoe/boot ?Ortho Device/Splint Interventions: Ordered ?  ?  ? ?Gleen Ripberger A Kasidi Shanker ?09/03/2021, 4:05 PM ? ?

## 2021-09-03 NOTE — Anesthesia Postprocedure Evaluation (Signed)
Anesthesia Post Note ? ?Patient: Joanne Carlson ? ?Procedure(s) Performed: AMPUTATION GREAT TOE (Left: Toe) ? ?  ? ?Patient location during evaluation: PACU ?Anesthesia Type: MAC ?Level of consciousness: awake and alert ?Pain management: pain level controlled ?Vital Signs Assessment: post-procedure vital signs reviewed and stable ?Respiratory status: spontaneous breathing, nonlabored ventilation, respiratory function stable and patient connected to nasal cannula oxygen ?Cardiovascular status: stable and blood pressure returned to baseline ?Postop Assessment: no apparent nausea or vomiting ?Anesthetic complications: no ? ? ?No notable events documented. ? ?Last Vitals:  ?Vitals:  ? 09/03/21 0830 09/03/21 0900  ?BP: (!) 132/48 (!) 140/54  ?Pulse: 81 79  ?Resp: 18 15  ?Temp:    ?SpO2: 100% 100%  ?  ?Last Pain:  ?Vitals:  ? 09/03/21 0732  ?TempSrc: Temporal  ?PainSc: 0-No pain  ? ? ?  ?  ?  ?  ?  ?  ? ?[place] [address] ? ? ? ? ?

## 2021-09-03 NOTE — Progress Notes (Signed)
PT Cancellation Note ? ?Patient Details ?Name: Joanne Carlson ?MRN: [id number] ?DOB: 07-31-1945 ? ? ?Cancelled Treatment:    Reason Eval/Treat Not Completed: Other (comment) Currently awaiting post op shoe. Will follow up as schedule allows.  ? ?Reuel Derby, PT, DPT  ?Acute Rehabilitation Services  ?Pager: [phone] ?Office: [phone] ? ? ? ?[place] ?09/03/2021, 3:59 PM ?

## 2021-09-04 DIAGNOSIS — M869 Osteomyelitis, unspecified: Secondary | ICD-10-CM

## 2021-09-04 LAB — CBC WITH DIFFERENTIAL/PLATELET
Abs Immature Granulocytes: 0.08 10*3/uL — ABNORMAL HIGH (ref 0.00–0.07)
Basophils Absolute: 0.1 10*3/uL (ref 0.0–0.1)
Basophils Relative: 1 %
Eosinophils Absolute: 0.5 10*3/uL (ref 0.0–0.5)
Eosinophils Relative: 4 %
HCT: 24.3 % — ABNORMAL LOW (ref 36.0–46.0)
Hemoglobin: 7.4 g/dL — ABNORMAL LOW (ref 12.0–15.0)
Immature Granulocytes: 1 %
Lymphocytes Relative: 14 %
Lymphs Abs: 2 10*3/uL (ref 0.7–4.0)
MCH: 28.9 pg (ref 26.0–34.0)
MCHC: 30.5 g/dL (ref 30.0–36.0)
MCV: 94.9 fL (ref 80.0–100.0)
Monocytes Absolute: 1.6 10*3/uL — ABNORMAL HIGH (ref 0.1–1.0)
Monocytes Relative: 11 %
Neutro Abs: 10.3 10*3/uL — ABNORMAL HIGH (ref 1.7–7.7)
Neutrophils Relative %: 69 %
Platelets: 339 10*3/uL (ref 150–400)
RBC: 2.56 MIL/uL — ABNORMAL LOW (ref 3.87–5.11)
RDW: 15.5 % (ref 11.5–15.5)
WBC: 14.5 10*3/uL — ABNORMAL HIGH (ref 4.0–10.5)
nRBC: 0 % (ref 0.0–0.2)

## 2021-09-04 LAB — RENAL FUNCTION PANEL
Albumin: 1.8 g/dL — ABNORMAL LOW (ref 3.5–5.0)
Anion gap: 9 (ref 5–15)
BUN: 16 mg/dL (ref 8–23)
CO2: 29 mmol/L (ref 22–32)
Calcium: 7.9 mg/dL — ABNORMAL LOW (ref 8.9–10.3)
Chloride: 100 mmol/L (ref 98–111)
Creatinine, Ser: 4.9 mg/dL — ABNORMAL HIGH (ref 0.44–1.00)
GFR, Estimated: 9 mL/min — ABNORMAL LOW (ref >60)
Glucose, Bld: 90 mg/dL (ref 70–99)
Phosphorus: 3.9 mg/dL (ref 2.5–4.6)
Potassium: 3.5 mmol/L (ref 3.5–5.1)
Sodium: 138 mmol/L (ref 135–145)

## 2021-09-04 LAB — GLUCOSE, CAPILLARY
Glucose-Capillary: 70 mg/dL (ref 70–99)
Glucose-Capillary: 122 mg/dL — ABNORMAL HIGH (ref 70–99)
Glucose-Capillary: 99 mg/dL (ref 70–99)

## 2021-09-04 MED ORDER — ACETAMINOPHEN 325 MG PO TABS: 650.0000 mg | ORAL_TABLET | ORAL | Status: DC | PRN

## 2021-09-04 MED ORDER — OXYCODONE HCL 5 MG PO TABS: 5.0000 mg | ORAL_TABLET | Freq: Three times a day (TID) | ORAL | 0 refills | Status: DC | PRN

## 2021-09-04 MED ORDER — DARBEPOETIN ALFA 100 MCG/0.5ML IJ SOSY: 100.0000 ug | PREFILLED_SYRINGE | INTRAMUSCULAR | Status: DC

## 2021-09-04 MED ORDER — VANCOMYCIN HCL 750 MG/150ML IV SOLN: 750.0000 mg | INTRAVENOUS | 0 refills | Status: AC

## 2021-09-04 MED ORDER — SODIUM CHLORIDE 0.9 % IV SOLN: 2.0000 g | INTRAVENOUS | Status: AC

## 2021-09-04 NOTE — Care Management Important Message (Signed)
Important Message ? ?Patient Details  ?Name: Joanne Carlson ?MRN: [id number] ?Date of Birth: August 24, 1945 ? ? ?Medicare Important Message Given:  Yes ? ? ? ? ?Shelda Altes ?09/04/2021, 10:49 AM ?

## 2021-09-04 NOTE — Progress Notes (Signed)
Occupational Therapy Treatment ?Patient Details ?Name: Joanne Carlson ?MRN: [id number] ?DOB: Feb 23, 1946 ?Today's Date: 09/04/2021 ? ? ?History of present illness 76 yo admitted 2/25 with left great toe ulcer with osteomyelitis s/p toe amputation 3/1. PMhx: ESRD on HD TTS, PAD, DM, bladder CA, HLD, anemia ?  ?OT comments ? Pt progressing towards established OT goals. Pt donning shoes while seated at EOB with Supervision and increased time. Pt performing sit<>stand with Min A for weight shift forward and then functional mobility in room with Min guard A and RW. Continue to recommend dc to home with HHOT and will continue to follow acutely as admitted.  ? ?Recommendations for follow up therapy are one component of a multi-disciplinary discharge planning process, led by the attending physician.  Recommendations may be updated based on patient status, additional functional criteria and insurance authorization. ?   ?Follow Up Recommendations ? Home health OT  ?  ?Assistance Recommended at Discharge PRN  ?Patient can return home with the following ? Assistance with cooking/housework;Assist for transportation ?  ?Equipment Recommendations ? None recommended by OT  ?  ?Recommendations for Other Services   ? ?  ?Precautions / Restrictions Precautions ?Precautions: Fall ?Precaution Comments: persistent vaginal bleeding ?Required Braces or Orthoses: Other Brace ?Other Brace: post op shoe ?Restrictions ?Weight Bearing Restrictions: Yes ?LLE Weight Bearing: Weight bearing as tolerated  ? ? ?  ? ?Mobility Bed Mobility ?Overal bed mobility: Modified Independent ?  ?  ?  ?  ?  ?  ?  ?  ? ?Transfers ?Overall transfer level: Needs assistance ?Equipment used: Rolling walker (2 wheels) ?Transfers: Sit to/from Stand ?Sit to Stand: Min assist ?  ?  ?  ?  ?  ?General transfer comment: Min A for weight shift forward ?  ?  ?Balance Overall balance assessment: Needs assistance ?Sitting-balance support: Feet supported, No upper extremity  supported ?Sitting balance-Leahy Scale: Good ?Sitting balance - Comments: EOB able to reach to don shoes ?  ?Standing balance support: Bilateral upper extremity supported ?Standing balance-Leahy Scale: Poor ?Standing balance comment: RW in standing ?  ?  ?  ?  ?  ?  ?  ?  ?  ?  ?  ?   ? ?ADL either performed or assessed with clinical judgement  ? ?ADL Overall ADL's : Needs assistance/impaired ?  ?  ?  ?  ?  ?  ?  ?  ?  ?  ?Lower Body Dressing: Min guard;Sit to/from stand ?Lower Body Dressing Details (indicate cue type and reason): donning tennis shoe (R) and post op (L). MIn Guard A for standing ?Toilet Transfer: Min guard;Rolling walker (2 wheels);BSC/3in1 ?  ?Toileting- Clothing Manipulation and Hygiene: Minimal assistance;Sit to/from stand ?  ?  ?  ?Functional mobility during ADLs: Min guard;Rolling walker (2 wheels) ?General ADL Comments: Min guard A throughout; Min A for power up from EOB but feel this is related to decreased cognition. Donning shoes at EOB without difficulty ?  ? ?Extremity/Trunk Assessment Upper Extremity Assessment ?Upper Extremity Assessment: Overall WFL for tasks assessed;Defer to OT evaluation ?  ?Lower Extremity Assessment ?Lower Extremity Assessment: Defer to PT evaluation ?LLE Deficits / Details: Deficits consistent with post op pain and weakness. ?  ?  ?  ? ?Vision   ?  ?  ?Perception   ?  ?Praxis   ?  ? ?Cognition Arousal/Alertness: Awake/alert, Suspect due to medications ?Behavior During Therapy: [facility] for tasks assessed/performed ?Overall Cognitive Status: Impaired/Different from baseline ?Area of Impairment: Memory ?  ?  ?  ?  ?  ?  ?  ?  ?  ?  ?  Memory: Decreased short-term memory ?  ?  ?  ?  ?General Comments: Pt reporting she is "high off these drugs". Reports she feel jittery and hasnt been able to rest. Pt requiring increased time throughotu session and cues for redirecting. Decreased attention and tangietal but feel this is probably close to baseline attention. ?  ?  ?    ?Exercises   ? ?  ?Shoulder Instructions   ? ? ?  ?General Comments VSS on RA. BP 133/80  ? ? ?Pertinent Vitals/ Pain       Pain Assessment ?Pain Assessment: No/denies pain ? ?Home Living   ?  ?  ?  ?  ?  ?  ?  ?  ?  ?  ?  ?  ?  ?  ?  ?  ?  ?  ? ?  ?Prior Functioning/Environment    ?  ?  ?  ?   ? ?Frequency ? Min 2X/week  ? ? ? ? ?  ?Progress Toward Goals ? ?OT Goals(current goals can now be found in the care plan section) ? Progress towards OT goals: Progressing toward goals ? ?Acute Rehab OT Goals ?OT Goal Formulation: With patient ?Time For Goal Achievement: 09/17/21 ?Potential to Achieve Goals: Good ?ADL Goals ?Pt Will Perform Lower Body Dressing: with modified independence;sit to/from stand ?Pt Will Transfer to Toilet: with modified independence;ambulating;regular height toilet;grab bars ?Pt Will Perform Toileting - Clothing Manipulation and hygiene: with modified independence;sit to/from stand ?Additional ADL Goal #1: Patient will stand at sink to perform grooming task as evidence of improving activity tolerance  ?Plan Discharge plan remains appropriate   ? ?Co-evaluation ? ? ?   ?  ?  ?  ?  ? ?  ?AM-PAC OT "6 Clicks" Daily Activity     ?Outcome Measure ? ? Help from another person eating meals?: None ?Help from another person taking care of personal grooming?: A Little ?Help from another person toileting, which includes using toliet, bedpan, or urinal?: A Little ?Help from another person bathing (including washing, rinsing, drying)?: A Little ?Help from another person to put on and taking off regular upper body clothing?: A Little ?Help from another person to put on and taking off regular lower body clothing?: A Little ?6 Click Score: 19 ? ?  ?End of Session Equipment Utilized During Treatment: Rolling walker (2 wheels) ? ?OT Visit Diagnosis: Other abnormalities of gait and mobility (R26.89);Pain ?  ?Activity Tolerance Patient tolerated treatment well ?  ?Patient Left in bed;with call bell/phone within  reach;with bed alarm set ?  ?Nurse Communication Mobility status (post op shoe) ?  ? ?   ? ?Time: 3546-5681 ?OT Time Calculation (min): 23 min ? ?Charges: OT General Charges ?$OT Visit: 1 Visit ?OT Treatments ?$Self Care/Home Management : 8-22 mins ?$Therapeutic Activity: 8-22 mins ? ?Tige Meas MSOT, OTR/L ?Acute Rehab ?Pager: [phone] ?Office: [phone] ? ?Paytyn Mesta M Kameo Bains ?09/04/2021, 4:40 PM ? ? ?

## 2021-09-04 NOTE — Progress Notes (Addendum)
ID Brief Note ? ?SURGICAL PATHOLOGY  ?CASE: [id number]  ?PATIENT: Joanne Carlson  ?Surgical Pathology Report  ? ? ? ? ?Clinical History: left toe infection (cm)  ? ? ?FINAL MICROSCOPIC DIAGNOSIS:  ? ?A. TOE, LEFT GREAT, AMPUTATION:  ?-  Ulcerated skin and subcutaneous tissue with necrosis and abscess  ?formation  ?-  Bone with acute inflammation (acute osteomyelitis)  ?-  Grossly viable margins  ? ? ?No new recommendations. ID will sign off.  ?No need for ID fu  ?Fu with Podiatry  ?Please call with questions  ? ?Rosiland Oz, MD ?Infectious Disease Physician ?[facility] ?[address] ?[place], [place] [address] ?Phone: [phone]  Fax: [phone] ? ? ?

## 2021-09-04 NOTE — Care Management (Addendum)
?  ?  Durable Medical Equipment  ?(From admission, onward)  ?  ? ? ?  ? ?  Start     Ordered  ? 09/04/21 0806  For home use only DME 4 wheeled rolling walker with seat  Once       ?Question:  Patient needs a walker to treat with the following condition  Answer:  Osteomyelitis ([place])  ? 09/04/21 0806  ? 09/04/21 0806  For home use only DME 3 n 1  Once       ? 09/04/21 0806  ? 09/04/21 0806  For home use only DME standard manual wheelchair with seat cushion  Once       ?Comments: Patient suffers from osteomyelitis S/P left toe amputation which impairs their ability to perform daily activities like toileting in the home.  A cane will not resolve issue with performing activities of daily living. A wheelchair will allow patient to safely perform daily activities. Patient can safely propel the wheelchair in the home or has a caregiver who can provide assistance. Length of need 6 months . ?Accessories: elevating leg rests (ELRs), wheel locks, extensions and anti-tippers.  ? 09/04/21 0806  ? ?  ?  ? ?  ?  ?

## 2021-09-04 NOTE — Progress Notes (Signed)
Pharmacy Antibiotic Note ? ?Joanne Carlson is a 76 y.o. female admitted on 08/29/2021 with  osteomyelitis .  Pharmacy has been consulted for vanc/zosyn dosing. She is now s/p partial hallux amputation. She is also noted with ESRD on HD TTS. Plans are noted for antibiotics through 3/16 per ID ?-cultures- ngtd, WBC= 14, afebrile ? ? ?Plan: ?-Vancomycin 750 mg IV TTS ?-Zosyn 2.25gm IV q8h ?-Will follow cultures and clinical progress ? ? ? ? ?Height: 5\' 4"  (162.6 cm) ?Weight: 73 kg (160 lb 15 oz) ?IBW/kg (Calculated) : 54.7 ? ?Temp (24hrs), Avg:97.9 ?F (36.6 ?C), Min:97.1 ?F (36.2 ?C), Max:98.5 ?F (36.9 ?C) ? ?Recent Labs  ?Lab 08/29/21 ?1719 08/29/21 ?2230 08/30/21 ?0023 08/31/21 ?5009 09/01/21 ?3818 09/02/21 ?2993 09/03/21 ?0303 09/04/21 ?0309  ?WBC 13.6*  --    < > 13.4* 12.7* 13.8* 13.5* 14.5*  ?CREATININE 3.85*  --    < > 6.72* 8.02* 5.91* 7.18* 4.90*  ?LATICACIDVEN 1.4 1.3  --   --   --   --   --   --   ? < > = values in this interval not displayed.  ? ?  ?Estimated Creatinine Clearance: 9.6 mL/min (A) (by C-G formula based on SCr of 4.9 mg/dL (H)).   ? ?Allergies  ?Allergen Reactions  ? Tylenol [Acetaminophen] Rash  ? ? ? ?Thank you for allowing pharmacy to be a part of this patientJoannes care. ? ?Hildred Laser, PharmD ?Clinical Pharmacist ?**Pharmacist phone directory can now be found on [email or web address] ([email or web address]).  Listed under [place]. ? ? ?

## 2021-09-04 NOTE — TOC Initial Note (Signed)
Transition of Care (TOC) - Initial/Assessment Note  ? ? ?Patient Details  ?Name: Joanne Carlson ?MRN: [id number] ?Date of Birth: 1945-11-23 ? ?Transition of Care (TOC) CM/SW Contact:    ?Graves-Bigelow, Ocie Cornfield, RN ?Phone Number: ?09/04/2021, 11:23 AM ? ?Clinical Narrative: Case Manager spoke with patient regarding home health services. Case Manager provided the patient with the Medicare.gov list and the patient chose [facility] for services. Referral submitted to [facility] and start of care to begin within 24-48 hours post transition home. Patient is agreeable for Case Manager to call [employer] for Durable Medical Equipment (DME): rolling walker and wheelchair. Patient declined the bedside commode. Patient states her apartment is small and she can easily get to her bathroom. Patient states she has support from her son and daughter that will be able to support the intermittent supervision. Patient states she will call her daughter for transportation home. Per podiatry note: no dressing change until follow up appointment. No further needs identified at this time.  ? ? ?Expected Discharge Plan: [place] ?Barriers to Discharge: No Barriers Identified ? ? ?Patient Goals and CMS Choice ?Patient states their goals for this hospitalization and ongoing recovery are:: to return home with home health services. ?CMS Medicare.gov Compare Post Acute Care list provided to:: Patient ?Choice offered to / list presented to : Patient ? ?Expected Discharge Plan and Services ?Expected Discharge Plan: [place] ?In-house Referral: NA ?Discharge Planning Services: CM Consult ?Post Acute Care Choice: Home Health ?Living arrangements for the past 2 months: Apartment ?                ?DME Arranged: Youth worker wheelchair with seat cushion, Walker rolling ?DME Agency: [employer] ?Date DME Agency Contacted: 09/04/21 ?Time DME Agency Contacted: [id number] ?Representative spoke with at DME Agency:  Freda Munro ?HH Arranged: PT, OT ?[place] Agency: [facility] ?Date HH Agency Contacted: 09/04/21 ?Time [place]: [phone] ?Representative spoke with at [place]: Anderson Malta ? ?Prior Living Arrangements/Services ?Living arrangements for the past 2 months: Apartment ?Lives with:: Self (Has family support of son and daugther.) ?Patient language and need for interpreter reviewed:: Yes ?Do you feel safe going back to the place where you live?: Yes      ?Need for Family Participation in Patient Care: Yes (Comment) ?Care giver support system in place?: Yes (comment) ?Current home services: DME ?Criminal Activity/Legal Involvement Pertinent to Current Situation/Hospitalization: No - Comment as needed ? ?Activities of Daily Living ?Home Assistive Devices/Equipment: CBG Meter ?ADL Screening (condition at time of admission) ?Patient's cognitive ability adequate to safely complete daily activities?: Yes ?Is the patient deaf or have difficulty hearing?: No ?Does the patient have difficulty seeing, even when wearing glasses/contacts?: No ?Does the patient have difficulty concentrating, remembering, or making decisions?: No ?Patient able to express need for assistance with ADLs?: Yes ?Does the patient have difficulty dressing or bathing?: No ?Independently performs ADLs?: Yes (appropriate for developmental age) ?Does the patient have difficulty walking or climbing stairs?: No ?Weakness of Legs: None ?Weakness of Arms/Hands: Both ? ?Permission Sought/Granted ?Permission sought to share information with : Family Supports, Case Manager ?Permission granted to share information with : Yes, Verbal Permission Granted ?   ? Permission granted to share info w AGENCY: [facility]. ?   ?   ? ?Emotional Assessment ?Appearance:: Appears stated age ?Attitude/Demeanor/Rapport: Engaged ?Affect (typically observed): Accepting ?Orientation: : Oriented to Self, Oriented to Place, Oriented to Situation, Oriented to  Time ?  Alcohol /  Substance Use: Not Applicable ?Psych Involvement: No (comment) ? ?Admission diagnosis:  Osteomyelitis ([place]) [M86.9] ?Osteomyelitis of left foot, unspecified type ([place]) [M86.9] ?Patient Active Problem List  ? Diagnosis Date Noted  ? Cellulitis   ? Medication monitoring encounter   ? Osteomyelitis ([place]) 08/29/2021  ? PVD (peripheral vascular disease) ([place]) 08/29/2021  ? Bladder tumor 07/22/2021  ? Aortic stenosis 04/08/2021  ? Lumbar radiculopathy 10/15/2020  ? Hypercalcemia 04/25/2020  ? Claudication in peripheral vascular disease ([place]) 03/03/2020  ? Pain of left hand 12/24/2019  ? Allergy, unspecified, initial encounter 02/05/2019  ? Anaphylactic shock, unspecified, initial encounter 02/05/2019  ? Encounter for removal of sutures 07/15/2018  ? Iron deficiency anemia, unspecified 02/27/2018  ? Atherosclerosis of native arteries of the extremities with gangrene ([place]) 11/23/2017  ? Critical lower limb ischemia ([place]) 01/07/2017  ? Shortness of breath 12/04/2016  ? Unspecified protein-calorie malnutrition ([place]) 12/02/2016  ? Encounter for immunization 11/22/2016  ? Dependence on renal dialysis ([place]) 11/16/2016  ? Coagulation defect, unspecified ([place]) 10/19/2016  ? Dorsalgia, unspecified 10/12/2016  ? Gout, unspecified 10/12/2016  ? Hyperkalemia 10/12/2016  ? 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 stage renal disease ([place]) 10/12/2016  ? Morbid (severe) obesity due to excess calories ([place]) 10/12/2016  ? Secondary hyperparathyroidism of renal origin ([place]) 10/12/2016  ? Chronic renal insufficiency, stage IV (severe) ([place]) 11/13/2013  ? End stage renal disease ([place]) 07/20/2013  ? Mechanical complication of other vascular device, implant, and graft 07/20/2013  ? Other specified disorders of kidney and ureter 11/30/2011  ? History of recurrent transient ischemic attacks 09/27/2011  ? HYPERCHOLESTEROLEMIA 08/07/2008  ? ANEMIA 08/07/2008  ? RENAL INSUFFICIENCY 08/07/2008  ? ALLERGIC  RHINITIS 03/04/2008  ? Type 2 diabetes mellitus without complication, with long-term current use of insulin ([place]) 03/15/2007  ? HYPERTENSION, BENIGN 03/15/2007  ? ?PCP:  Vernie Shanks, MD ?Pharmacy:   ?[facility], [place] - [address] ?[address] ?[place] [address] ?Phone: [phone] Fax: [phone] ? ?Readmission Risk Interventions ?Readmission Risk Prevention Plan 09/04/2021  ?Transportation Screening Complete  ?PCP or Specialist Appt within 3-5 Days Complete  ?[place] or Home Care Consult Complete  ?Social Work Consult for [place] Planning/Counseling Complete  ?Palliative Care Screening Not Applicable  ?Medication Review Press photographer) Complete  ?Some recent data might be hidden  ? ? ? ?

## 2021-09-04 NOTE — Discharge Summary (Signed)
Physician Discharge Summary  Joanne Carlson JKD:[id number] DOB: 12-May-1946 DOA: 08/29/2021  PCP: Vernie Shanks, MD  Admit date: 08/29/2021 Discharge date: 09/04/2021  Time spent: 60 minutes  Recommendations for Outpatient Follow-up:  Follow-up with Dr. Boneta Lucks, orthopedics in 1 week. Follow-up with Dr. Gwenlyn Found, cardiology in 2 to 3 weeks. Follow-up in hemodialysis center on usual Tuesday Thursday Saturday on 09/05/2021 for regular dialysis.  Patient will also be receiving IV vancomycin IV cefepime during hemodialysis for the next 14 days. Follow-up with Dr West Bali, ID on 10/02/2021 at 10:30 AM. Follow-up with Vernie Shanks, MD in 2 to 3 weeks.   Discharge Diagnoses:  Principal Problem:   Osteomyelitis ([place]) Active Problems:   Type 2 diabetes mellitus without complication, with long-term current use of insulin (HCC)   End stage renal disease (HCC)   Bladder tumor   PVD (peripheral vascular disease) ([place])   Cellulitis   Medication monitoring encounter   Discharge Condition: Stable and improved.  Diet recommendation: Heart healthy diet.  Filed Weights   09/02/21 1146 09/03/21 0732 09/03/21 1100  Weight: 72.6 kg 74 kg 73 kg    History of present illness:  HPI per Dr.Tu  Hospital Course:   Assessment and Plan: * Osteomyelitis ([place]) -Has known PAD followed by cardiology Dr. Gwenlyn Found. Last peripheral angiogram on 03/03/2020 revealing minimal disease in the mid and distal right SFA in the 50% range, patent left external iliac artery stents with high-grade segmental calcified proximal mid left SFA stenosis.  She underwent shockwave intravascular lithotripsy and drug-coated balloon angioplasty with good results. 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   -IV Zosyn was discontinued preoperatively in hopes of cultures being obtained during surgery.  Surgical path obtained and pending.   -Dr. Gwenlyn Found: s/p Successful Cutting Balloon angioplasty followed by [facility] of 99% P2 segment left popliteal artery stenosis. -Patient seen in consultation by ID who are recommending continuation of vancomycin and cefepime with HD for 14 days post OR date on 09/02/2021 with outpatient follow-up in the ID clinic.   -Patient also need follow-up in podiatry clinic 1 week postdischarge.   -PT OT consulted and recommended home health therapies.    PVD (peripheral vascular disease) ([place]) -History of bilateral SFA stent and follows with cardiology Dr. Gwenlyn Found -- Patient maintained on DAPT with aspirin and Plavix during the hospitalization. -Outpatient follow-up with Dr. Gwenlyn Found  Bladder tumor - S/p TURBT on 07/22/2021. -Outpatient follow-up with urology.  End stage renal disease [facility]) -Patient followed by nephrology during the hospitalization.  -Patient on HD TTS. -Patient underwent hemodialysis during the hospitalization on her regular days of TTS.  -Status post IV iron during hemodialysis. -Outpatient follow-up in hemodialysis clinic/nephrology.  Type 2 diabetes mellitus without complication, with long-term current use of insulin (HCC) Controlled.  Hemoglobin A1c of 5.7 (07/15/2021) -Diet liberalized to regular diet as patient noted with poor oral intake per dietitian recommendations.   -CBGs remained stable.   -Outpatient follow-up with PCP.         Procedures: Plain films of the left foot 08/29/2021 MRI left foot 08/30/2021 ABI with TBI 08/30/2021 Abdominal aortogram/bilateral iliac angiogram with left lower extremity angiography with runoff with Cutting Balloon angioplasty followed by [facility] left  P2 segment of popliteal artery per Dr. Gwenlyn Found 08/31/2021 Great toe amputation per podiatry, Dr. Boneta Lucks 09/02/2021    Consultations: Podiatry: Dr. Jacqualyn Posey 08/30/2021 Cardiology: Dr.Berry 08/31/2021 Nephrology: Dr. Hollie Salk 08/31/2021 ID: Dr.Manandhar 09/03/2021  Discharge Exam: Vitals:   09/03/21 2106 09/04/21 1534  BP: (!) 128/59 (!) 144/68  Pulse: 81   Resp: 19 19  Temp: 98.5 F (36.9 C) 98.2 F (36.8 C)  SpO2:      General: NAD. Cardiovascular: Regular rate and rhythm no murmurs rubs or gallops.  No JVD.  No lower extremity edema. Respiratory: Lungs clear to auscultation bilaterally.  No wheezes, no crackles, no rhonchi.  Normal respiratory effort.  No use of accessory muscles of respiration.  Speaking in full sentences.  Discharge Instructions   Discharge Instructions     Diet - low sodium heart healthy   Complete by: As directed    Discharge wound care:   Complete by: As directed    As per podiatry.   Increase activity slowly   Complete by: As directed       Allergies as of 09/04/2021       Reactions   Tylenol [acetaminophen] Rash        Medication List     STOP taking these medications    cephALEXin 500 MG capsule Commonly known as: Keflex   phenazopyridine 200 MG tablet Commonly known as: Pyridium       TAKE these medications    Accu-Chek Aviva Plus w/Device Kit   acetaminophen 325 MG tablet Commonly known as: TYLENOL Take 2 tablets (650 mg total) by mouth every 4 (four) hours as needed for headache or mild pain.   allopurinol 100 MG tablet Commonly known as: ZYLOPRIM Take 100 mg by mouth daily.   aspirin EC 81 MG tablet Take 81 mg by mouth daily.   atorvastatin 80 MG tablet Commonly known as: LIPITOR TAKE 1 TABLET BY MOUTH ONCE DAILY AT  6:00 PM What changed: See the new instructions.   calcium acetate 667 MG capsule Commonly known as: PHOSLO Take 667 mg by mouth 3 (three) times daily with meals.   ceFEPIme 2 g in sodium chloride 0.9 % 100 mL Inject 2 g into the vein every Tuesday, Thursday, and Saturday at 6 PM for 14  days. Start taking on: September 05, 2021   cinacalcet 60 MG tablet Commonly known as: SENSIPAR Take 60 mg by mouth daily.   clopidogrel 75 MG tablet Commonly known as: PLAVIX Take 75 mg by mouth daily.   gabapentin 100 MG capsule Commonly known as: NEURONTIN Take 1 capsule by mouth every morning and 1 capsule at bedtime. PATIENT NEEDS OFFICE VISIT FOR ADDITIONAL REFILLS What changed:  how much to take how to take this when to take this additional instructions   glucose blood test strip Use to test blood sugar daily. Dx code: 36.02.   Lancets Misc Use to test blood sugar daily. Dx code 250.02.   lidocaine-prilocaine cream Commonly known as: EMLA Apply 1 application topically Every Tuesday,Thursday,and Saturday with dialysis.   multivitamin Tabs tablet Take 1 tablet by mouth daily.   oxybutynin 5 MG tablet Commonly known as: DITROPAN Take 1 tablet (5 mg total) by mouth 3 (three) times daily as needed for bladder spasms.   oxyCODONE 5 MG immediate release tablet Commonly known as: Roxicodone Take 1 tablet (5 mg total) by mouth every 8 (eight) hours as needed.   vancomycin 750 MG/150ML Soln Commonly known as: VANCOREADY Inject 150 mLs (  750 mg total) into the vein Every Tuesday,Thursday,and Saturday with dialysis for 14 days.               Durable Medical Equipment  (From admission, onward)           Start     Ordered   09/04/21 1127  For home use only DME Walker rolling  Once       Question Answer Comment  Walker: With [place] Wheels   Patient needs a walker to treat with the following condition General weakness      09/04/21 1126   09/04/21 0806  For home use only DME 4 wheeled rolling walker with seat  Once       Question:  Patient needs a walker to treat with the following condition  Answer:  Osteomyelitis ([place])   09/04/21 0806   09/04/21 0806  For home use only DME 3 n 1  Once        09/04/21 0806   09/04/21 0806  For home use only DME standard  manual wheelchair with seat cushion  Once       Comments: Patient suffers from osteomyelitis S/P left toe amputation which impairs their ability to perform daily activities like toileting in the home.  A cane will not resolve issue with performing activities of daily living. A wheelchair will allow patient to safely perform daily activities. Patient can safely propel the wheelchair in the home or has a caregiver who can provide assistance. Length of need 6 months . Accessories: elevating leg rests (ELRs), wheel locks, extensions and anti-tippers.   09/04/21 0806              Discharge Care Instructions  (From admission, onward)           Start     Ordered   09/04/21 0000  Discharge wound care:       Comments: As per podiatry.   09/04/21 1643           Allergies  Allergen Reactions   Tylenol [Acetaminophen] Rash    Follow-up Information     Rosiland Oz, MD Follow up on 10/02/2021.   Specialty: Infectious Diseases Why: Hospital Discharge Follow Up @ 10:30 am. Please arrive 15 minutes early to register. Call [phone] if rescheduling is required. Contact information: [place] [address] [place] [address] [place], [place] Follow up.   Specialty: Home Health Services Why: Physical Therapy/Occupational Therapy-office to call with visit times. Contact information: [address] [place] [address] [phone]         [facility] Follow up.   Why: Research officer, trade union and Wheelchair to be delivered to the room prior to transition home. Contact information: [address] [place] [address] [phone]         Felipa Furnace, DPM. Schedule an appointment as soon as possible for a visit in 1 week(s).   Specialty: Podiatry Contact information: [address] [place] [address] [phone]         Hemodialysis Follow up in 1 day(s).   Why: Follow-up in hemodialysis as  previously scheduled tomorrow 09/05/2021        Vernie Shanks, MD. Schedule an appointment as soon as possible for a visit in 2 week(s).   Specialty: Family Medicine Why: Follow-up in 2 to 3 weeks for Contact information: [place] [address] [phone]  Lorretta Harp, MD. Schedule an appointment as soon as possible for a visit in 2 week(s).   Specialties: Cardiology, Radiology Why: Follow-up in 2 to 3 weeks. Contact information: [address] [place] [address] [phone]                  The results of significant diagnostics from this hospitalization (including imaging, microbiology, ancillary and laboratory) are listed below for reference.    Significant Diagnostic Studies: MR FOOT LEFT WO CONTRAST  Result Date: 08/30/2021 CLINICAL DATA:  Foot swelling, diabetic, osteomyelitis suspected, xray donePatient reports toes on left foot began bothering her in December, left lower leg, ankle swelling/red. Patient went to UC today and was told she has gangrene, foul odor. Pain rated 4/10 EXAM: MRI OF THE LEFT FOOT WITHOUT CONTRAST TECHNIQUE: Multiplanar, multisequence MR imaging of the left foot was performed. No intravenous contrast was administered. COMPARISON:  Left foot radiographs, 08/29/2021 and 04/20/2017. FINDINGS: Bones/Joint/Cartilage The distal phalanx of the great toe is truncated, which may be from resorption, prior surgery or a combination. There is abnormal signal from the great toe distal phalanx, decreased in T1 and hyperintense on T2, with portions of the low signal intensity cortical line disrupted, predominately along the lateral margin. A small focus of decreased T1 signal is seen in the subchondral bone along the lateral margin of the head of the proximal phalanx of the great toe, without overlying cortical resorption, felt to be degenerative in origin. There is more diffuse increased T2 signal in the distal  aspect of the great toe proximal phalanx. There are no other areas of abnormal bone marrow signal. No joint effusion. Ligaments Intact. Muscles and Tendons Increased T2 signal noted throughout the intrinsic foot musculature. Tendons normal in signal and thickness. Soft tissues Diffuse soft tissue edema, most evident involving the great toe. No fluid collections to suggest an abscess. IMPRESSION: 1. Abnormal signal from the remaining distal phalanx of the great toe consistent with osteomyelitis. 2. No other evidence of osteomyelitis. Increased T2 signal within the distal aspect of the proximal phalanx of the great toe, consistent with reactive edema. 3. Great toe soft tissue edema with a likely component of cellulitis. No evidence of an abscess. 4. More diffuse soft tissue and intrinsic foot muscle edema. Electronically Signed   By: Lajean Manes M.D.   On: 08/30/2021 10:08   PERIPHERAL VASCULAR CATHETERIZATION  Result Date: 08/31/2021 Images from the original result were not included.  [id number] LOCATION:  FACILITY: [facility] PHYSICIAN: [name], M.D. 12/14/1945 DATE OF PROCEDURE:  08/31/2021 DATE OF DISCHARGE: PV Angiogram/Intervention History obtained from chart review.Joanne Carlson is a 76 y.o. female with a hx of PVD, ESRD on HD, DM, HLD, HTN, bladder CA s/p TURBT and mild AS who is being seen 08/31/2021 for the evaluation of osteomyelitis, PVD  at the request of Dr. Eliseo Squires.  I have performed right and left SFA intervention in the past for critical ischemia resulting in wound healing.  She was admitted with a nonhealing wound of her left great toe with evidence of osteomyelitis.  She is on hemodialysis scheduled for HD tomorrow.  She presents now for peripheral angiography potential intervention for limb salvage. Pre Procedure Diagnosis: Critical ischemia/PAD Post Procedure Diagnosis: Critical ischemia/PAD Operators: Dr. [name] Procedures Performed:  1.  Ultrasound-guided right common femoral access   2.  Abdominal aortogram/bilateral iliac angiogram  3.  Contralateral access (second-order catheter placement)  4.  Left lower extremity angiography with runoff  5.  Score flex Cutting Balloon angioplasty followed by DCB left P2 segment of popliteal artery PROCEDURE DESCRIPTION: The patient was brought to the second floor  Cardiac cath lab in the the postabsorptive state. She was premedicated with IV Versed and fentanyl. Her right groin was prepped and shaved in usual sterile fashion. Xylocaine 1% was used for local anesthesia. A 5 French sheath was inserted into the right common femoral artery using standard Seldinger technique.  Ultrasound was used to identify the right common femoral artery and guide access.  A digital copy was captured and placed the patient's chart.  A 5 French pigtail catheter was placed in the distal abdominal aorta.  Distal abdominal aortography, bilateral iliac angiography was performed.  Contralateral access was obtained with a 5 [place] crossover catheter and endhole catheter (second-order catheter placement).  Left lower extremity angiography with runoff was performed using bolus chase, digital subtraction and step table technique.  Omnipaque dye was used for the entirety of the case (150 cc total contrast the patient).  Retrograde ordered pressures monitored during the case.  Angiographic Data: 1: Abdominal aorta-widely patent 2: Left lower extremity-left extrailiac artery stent was widely patent.  There were scattered 50 to 70% calcified focal stenoses in the proximal, mid and distal left SFA.  There was a 99% left P2 popliteal segment stenosis with three-vessel runoff. 3: Right lower extremity-the right common, external iliac and common femoral arteries widely patent   Ms. Duch has critical limb ischemia with scattered calcified focal stenoses in the proximal, mid and distal left SFA as well as a 99% P2 segment left popliteal artery stenosis probably accounting  for her diminished infrapopliteal blood flow and decreased left ABI.  We will proceed with Cutting Balloon angioplasty followed by [facility] Procedure Description: The endhole catheter was exchanged over a Rosen wire for a 6 [place] 55 cm multipurpose Ansell sheath.  Patient received 11,000 units of heparin with an ACT of 299.  Omnipaque dye was used for the entirety of the intervention.  Retrograde pressures monitored during the case. I navigated the left SFA with a 6 g 014 Shepperd wire I was able to cross the popliteal lesion and accessed the anterior tibial artery.  I then performed Cutting Balloon angioplasty with a 3.5 mm x 20 mm long score flex Cutting Balloon at nominal pressures for 2 minutes.  I follow this with a 5 mm x 4 cm Ranger DCB at 4 atm for 3 minutes resulting in reduction of a 99% P2 segment left popliteal artery stenosis to 0% residual with excellent flow.  The patient tolerated the procedure well.  The wire was removed.  The Ansell sheath was removed over an 035 versa core wire and exchanged for a short 6 [place] sheath which was then secured in place.  The patient received 300 mg of p.o. clopidogrel. Final Impression: Successful Cutting Balloon angioplasty followed by DCB of 99% P2 segment left popliteal artery stenosis in the setting of critical ischemia.  The sheath will be removed once ACT falls below 170 and pressure held.  She will be treated with DAPT as well as ongoing wound care and antibiotic therapy.  She left the lab in stable condition. [name]. MD, [facility] 08/31/2021 4:15 PM    DG Foot Complete Left  Result Date: 09/02/2021 CLINICAL DATA:  Status post toe amputation. EXAM: LEFT FOOT - COMPLETE 3+ VIEW COMPARISON:  August 29, 2021. FINDINGS: Status post surgical amputation of the first proximal and distal phalanges. Vascular calcifications  are noted. No other bony abnormality is noted. IMPRESSION: Status post surgical amputation of first proximal and distal phalanges.  Electronically Signed   By: Marijo Conception M.D.   On: 09/02/2021 15:00   DG Foot Complete Left  Result Date: 08/29/2021 CLINICAL DATA:  Pain and swelling EXAM: LEFT FOOT - COMPLETE 3+ VIEW COMPARISON:  04/20/2017 FINDINGS: No recent fracture or dislocation is seen. There is possible previous partial resection of distal portion of distal phalanx of big toe. There is small radiolucency in the proximal shaft of distal phalanx of left big toe. Rest of the bony structures are unremarkable. Arterial calcifications are seen in the soft tissues. Bony spurs seen in the tarsometatarsal joints. Small plantar spur is seen in the calcaneus. Calcific bursitis/tendinosis is seen in the Achilles tendon close to calcaneus. IMPRESSION: There is 6 mm faint radiolucency in the remaining base of distal phalanx of left big toe. Possibility of osteomyelitis is not excluded. Follow-up MRI as clinically warranted should be considered. No recent fracture or dislocation is seen. Other findings as described in the body of the report. Electronically Signed   By: Elmer Picker M.D.   On: 08/29/2021 17:37   VAS [place] ABI WITH/WO TBI  Result Date: 09/01/2021  LOWER EXTREMITY DOPPLER STUDY Patient Name:  Joanne Carlson  Date of Exam:   08/30/2021 Medical Rec #: [id number]         Accession #:    [id number] Date of Birth: [birth year]          Patient Gender: F Patient Age:   64 years Exam Location:  [facility] Procedure:      VAS [place] ABI WITH/WO TBI Referring Phys: CHING TU --------------------------------------------------------------------------------  Indications: Ulceration. High Risk Factors: Hypertension, hyperlipidemia, Diabetes, past history of                    smoking, prior CVA. Other Factors: ESRD(HD), PAD.  Vascular Interventions: LLE angioplasty & stent placement (2021), RLE                         angioplasty & atherectomy (2019). Comparison Study: Previous exam 04/06/21 Performing Technologist: Rogelia Rohrer RVT, RDMS   Examination Guidelines: A complete evaluation includes at minimum, Doppler waveform signals and systolic blood pressure reading at the level of bilateral brachial, anterior tibial, and posterior tibial arteries, when vessel segments are accessible. Bilateral testing is considered an integral part of a complete examination. Photoelectric Plethysmograph (PPG) waveforms and toe systolic pressure readings are included as required and additional duplex testing as needed. Limited examinations for reoccurring indications may be performed as noted.  ABI Findings: +---------+------------------+-----+----------+---------+  Right     Rt Pressure (mmHg) Index Waveform   Comment    +---------+------------------+-----+----------+---------+  Brachial                                      HD access  +---------+------------------+-----+----------+---------+  PTA       102                0.87  monophasic            +---------+------------------+-----+----------+---------+  DP        111                0.95  biphasic              +---------+------------------+-----+----------+---------+  Great Toe 165                1.41  Abnormal              +---------+------------------+-----+----------+---------+ +---------+------------------+-----+----------+-------+  Left      Lt Pressure (mmHg) Index Waveform   Comment  +---------+------------------+-----+----------+-------+  Brachial  117                      triphasic           +---------+------------------+-----+----------+-------+  PTA       65                 0.56  monophasic          +---------+------------------+-----+----------+-------+  DP        73                 0.62  monophasic          +---------+------------------+-----+----------+-------+  Great Toe 25                 0.21  Abnormal            +---------+------------------+-----+----------+-------+ +-------+-----------+-----------+------------+------------+  ABI/TBI Today's ABI Today's TBI Previous ABI Previous TBI   +-------+-----------+-----------+------------+------------+  Right   0.95        1.41        0.94         1.18          +-------+-----------+-----------+------------+------------+  Left    0.62        0.21        [place]           0.60          +-------+-----------+-----------+------------+------------+  Summary: Right: Resting right ankle-brachial index indicates mild right lower extremity arterial disease. The right toe-brachial index is abnormal (falsely elevated - likely due to vessel calcification). Left: Resting left ankle-brachial index indicates moderate left lower extremity arterial disease. The left toe-brachial index is abnormal (severe).  *See table(s) above for measurements and observations.  Electronically signed by Deitra Mayo MD on 09/01/2021 at 1:45:13 PM.    Final     Microbiology: Recent Results (from the past 240 hour(s))  Blood culture (routine x 2)     Status: None   Collection Time: 08/29/21  5:19 PM   Specimen: BLOOD  Result Value Ref Range Status   Specimen Description   Final    BLOOD BLOOD LEFT ARM Performed at [facility], [place] [address] [place], [place] [address]    Special Requests   Final    BOTTLES DRAWN AEROBIC AND ANAEROBIC Blood Culture adequate volume Performed at [place] [address] [place], [place] [address]    Culture   Final    NO GROWTH 5 DAYS Performed at [facility], [place] [address], [place], [place] [address]    Report Status 09/03/2021 FINAL  Final  Resp Panel by RT-PCR (Flu A&B, Covid) Nasopharyngeal Swab     Status: None   Collection Time: 08/29/21  5:19 PM   Specimen: Nasopharyngeal Swab; Nasopharyngeal(NP) swabs in vial transport medium  Result Value Ref Range Status   SARS Coronavirus 2 by RT PCR NEGATIVE NEGATIVE Final    Comment: (NOTE) SARS-CoV-2 target nucleic acids are NOT DETECTED.  The SARS-CoV-2 RNA is generally detectable in upper respiratory specimens during the acute  phase of infection. The lowest concentration of SARS-CoV-2 viral copies this assay can  detect is 138 copies/mL. A negative result does not preclude SARS-Cov-2 infection and should not be used as the sole basis for treatment or other patient management decisions. A negative result may occur with  improper specimen collection/handling, submission of specimen other than nasopharyngeal swab, presence of viral mutation(s) within the areas targeted by this assay, and inadequate number of viral copies(<138 copies/mL). A negative result must be combined with clinical observations, patient history, and epidemiological information. The expected result is Negative.  Fact Sheet for Patients:  EntrepreneurPulse.com.au  Fact Sheet for Healthcare Providers:  IncredibleEmployment.be  This test is no t yet approved or cleared by the [place] FDA and  has been authorized for detection and/or diagnosis of SARS-CoV-2 by FDA under an Emergency Use Authorization (EUA). This EUA will remain  in effect (meaning this test can be used) for the duration of the COVID-19 declaration under Section 564(b)(1) of the Act, 21 U.S.C.section 360bbb-3(b)(1), unless the authorization is terminated  or revoked sooner.       Influenza A by PCR NEGATIVE NEGATIVE Final   Influenza B by PCR NEGATIVE NEGATIVE Final    Comment: (NOTE) The Xpert Xpress SARS-CoV-2/FLU/RSV plus assay is intended as an aid in the diagnosis of influenza from Nasopharyngeal swab specimens and should not be used as a sole basis for treatment. Nasal washings and aspirates are unacceptable for Xpert Xpress SARS-CoV-2/FLU/RSV testing.  Fact Sheet for Patients: EntrepreneurPulse.com.au  Fact Sheet for Healthcare Providers: IncredibleEmployment.be  This test is not yet approved or cleared by the [place] FDA and has been authorized for detection and/or diagnosis of  SARS-CoV-2 by FDA under an Emergency Use Authorization (EUA). This EUA will remain in effect (meaning this test can be used) for the duration of the COVID-19 declaration under Section 564(b)(1) of the Act, 21 U.S.C. section 360bbb-3(b)(1), unless the authorization is terminated or revoked.  Performed at [facility], [place] [address], [place], [place] [address]   Blood culture (routine x 2)     Status: None   Collection Time: 08/29/21 10:30 PM   Specimen: BLOOD  Result Value Ref Range Status   Specimen Description BLOOD SITE NOT SPECIFIED  Final   Special Requests   Final    BOTTLES DRAWN AEROBIC AND ANAEROBIC Blood Culture adequate volume   Culture   Final    NO GROWTH 5 DAYS Performed at [facility], [address], [place], [place] [address]    Report Status 09/03/2021 FINAL  Final  Surgical pcr screen     Status: None   Collection Time: 09/02/21  5:14 AM   Specimen: Nasal Mucosa; Nasal Swab  Result Value Ref Range Status   MRSA, PCR NEGATIVE NEGATIVE Final   Staphylococcus aureus NEGATIVE NEGATIVE Final    Comment: (NOTE) The Xpert SA Assay (FDA approved for NASAL specimens in patients 22 years of age and older), is one component of a comprehensive surveillance program. It is not intended to diagnose infection nor to guide or monitor treatment. Performed at [facility], [place] [address] [place], [place] [address]      Labs: Basic Metabolic Panel: Recent Labs  Lab 08/31/21 0324 09/01/21 0209 09/02/21 0855 09/03/21 0303 09/04/21 0309  NA 138 135 137 135 138  K 4.0 4.2 4.3 4.6 3.5  CL 96* 92* 98 97* 100  CO2 _0 GLUCOSE 145* 130* 90 105* 90  BUN 39* 46* 24* 31* 16  CREATININE 6.72* 8.02* 5.91* 7.18*  4.90*  CALCIUM 7.6* 7.8* 8.3* 7.6* 7.9*  PHOS  --  4.1 3.6 4.0 3.9   Liver Function Tests: Recent Labs  Lab 08/29/21 1719 09/02/21 0855 09/03/21 0303 09/04/21 0309  AST 34  --   --   --   ALT 23  --   --   --    ALKPHOS 94  --   --   --   BILITOT 0.5  --   --   --   PROT 7.7  --   --   --   ALBUMIN 2.6* 2.1* 1.8* 1.8*   No results for input(s): LIPASE, AMYLASE in the last 168 hours. No results for input(s): AMMONIA in the last 168 hours. CBC: Recent Labs  Lab 08/29/21 1719 08/30/21 0023 08/31/21 0324 09/01/21 0209 09/02/21 0855 09/03/21 0303 09/04/21 0309  WBC 13.6*   < > 13.4* 12.7* 13.8* 13.5* 14.5*  NEUTROABS 10.6*  --   --   --  10.0* 9.9* 10.3*  HGB 9.1*   < > 7.9* 8.1* 8.3* 7.7* 7.4*  HCT 29.6*   < > 25.9* 25.8* 27.9* 25.5* 24.3*  MCV 95.8   < > 95.2 93.5 95.2 94.4 94.9  PLT 380   < > 344 356 399 321 339   < > = values in this interval not displayed.   Cardiac Enzymes: No results for input(s): CKTOTAL, CKMB, CKMBINDEX, TROPONINI in the last 168 hours. BNP: BNP (last 3 results) No results for input(s): BNP in the last 8760 hours.  ProBNP (last 3 results) No results for input(s): PROBNP in the last 8760 hours.  CBG: Recent Labs  Lab 09/03/21 1544 09/03/21 2129 09/04/21 0833 09/04/21 1142 09/04/21 1617  GLUCAP 141* 175* 70 122* 99       Signed:  Irine Seal MD.  [facility] 09/04/2021, 4:58 PM

## 2021-09-04 NOTE — Progress Notes (Signed)
Physical Therapy Treatment ?Patient Details ?Name: Joanne Carlson ?MRN: [id number] ?DOB: [birth year] ?Today's Date: 09/04/2021 ? ? ?History of Present Illness 76 yo admitted 2/25 with left great toe ulcer with osteomyelitis s/p toe amputation 3/1. PMhx: ESRD on HD TTS, PAD, DM, bladder CA, HLD, anemia ? ?  ?PT Comments  ? ? Pt pleasant and reports no pain after receiving morphine. Pt with min assist to don shoes and able to walk in hall with post op shoe and tennis shoe with slow gait and reliance on RW. Pt with significant activity progression and requested return to bed due to feeling sleepy after medication. D/C plan remains appropriate.  ?   ?Recommendations for follow up therapy are one component of a multi-disciplinary discharge planning process, led by the attending physician.  Recommendations may be updated based on patient status, additional functional criteria and insurance authorization. ? ?Follow Up Recommendations ? Home health PT ?  ?  ?Assistance Recommended at Discharge Intermittent Supervision/Assistance  ?Patient can return home with the following A little help with walking and/or transfers;A little help with bathing/dressing/bathroom;Assistance with cooking/housework;Help with stairs or ramp for entrance;Assist for transportation ?  ?Equipment Recommendations ? Rolling walker (2 wheels);BSC/3in1  ?  ?Recommendations for Other Services   ? ? ?  ?Precautions / Restrictions Precautions ?Precautions: Fall ?Required Braces or Orthoses: Other Brace ?Other Brace: post op shoe ?Restrictions ?LLE Weight Bearing: Weight bearing as tolerated  ?  ? ?Mobility ? Bed Mobility ?Overal bed mobility: Modified Independent ?  ?  ?  ?  ?  ?  ?  ?  ? ?Transfers ?Overall transfer level: Needs assistance ?  ?Transfers: Sit to/from Stand ?Sit to Stand: Min guard ?  ?  ?  ?  ?  ?General transfer comment: guarding for safety and positioning at bed ?  ? ?Ambulation/Gait ?Ambulation/Gait assistance: Min guard ?Gait Distance  (Feet): 180 Feet ?Assistive device: Rolling walker (2 wheels) ?Gait Pattern/deviations: Step-through pattern, Decreased stride length ?  ?Gait velocity interpretation: <1.31 ft/sec, indicative of household ambulator ?  ?General Gait Details: pt with very slow gait <66ft/sec with reliance on rail and 2 episodes of asterixis end of gait requiring min assist to steady ? ? ?Stairs ?  ?  ?  ?  ?  ? ? ?Wheelchair Mobility ?  ? ?Modified Rankin (Stroke Patients Only) ?  ? ? ?  ?Balance Overall balance assessment: Needs assistance ?  ?Sitting balance-Leahy Scale: Good ?Sitting balance - Comments: eOB able to reach to don shoes ?  ?Standing balance support: Bilateral upper extremity supported ?Standing balance-Leahy Scale: Poor ?Standing balance comment: RW in standing ?  ?  ?  ?  ?  ?  ?  ?  ?  ?  ?  ?  ? ?  ?Cognition Arousal/Alertness: Awake/alert, Suspect due to medications ?Behavior During Therapy: [facility] for tasks assessed/performed ?Overall Cognitive Status: Impaired/Different from baseline ?Area of Impairment: Memory ?  ?  ?  ?  ?  ?  ?  ?  ?  ?  ?Memory: Decreased short-term memory ?  ?  ?  ?  ?General Comments: pt received morphine prior to session with decreased awareness of safety and memory ?  ?  ? ?  ?Exercises   ? ?  ?General Comments   ?  ?  ? ?Pertinent Vitals/Pain Pain Assessment ?Pain Assessment: No/denies pain ?Pain Intervention(s): Limited activity within patient's tolerance, Premedicated before session, Repositioned, Monitored during session  ? ? ?Home Living   ?  ?  ?  ?  ?  ?  ?  ?  ?  ?   ?  ?  Prior Function    ?  ?  ?   ? ?PT Goals (current goals can now be found in the care plan section) Progress towards PT goals: Progressing toward goals ? ?  ?Frequency ? ? ? Min 3X/week ? ? ? ?  ?PT Plan Current plan remains appropriate  ? ? ?Co-evaluation   ?  ?  ?  ?  ? ?  ?AM-PAC PT "6 Clicks" Mobility   ?Outcome Measure ? Help needed turning from your back to your side while in a flat bed without using bedrails?:  None ?Help needed moving from lying on your back to sitting on the side of a flat bed without using bedrails?: None ?Help needed moving to and from a bed to a chair (including a wheelchair)?: A Little ?Help needed standing up from a chair using your arms (e.g., wheelchair or bedside chair)?: A Little ?Help needed to walk in hospital room?: A Little ?Help needed climbing 3-5 steps with a railing? : A Little ?6 Click Score: 20 ? ?  ?End of Session   ?Activity Tolerance: Patient tolerated treatment well ?Patient left: in bed;with call bell/phone within reach;with bed alarm set ?Nurse Communication: Mobility status ?PT Visit Diagnosis: Unsteadiness on feet (R26.81);Muscle weakness (generalized) (M62.81);Difficulty in walking, not elsewhere classified (R26.2);Pain ?  ? ? ?Time: 4315-4008 ?PT Time Calculation (min) (ACUTE ONLY): 35 min ? ?Charges:  $Gait Training: 23-37 mins          ?          ? ?Yalanda Soderman P, PT ?Acute Rehabilitation Services ?Pager: [phone] ?Office: [phone] ? ? ? ?Allyce Bochicchio B Normajean Nash ?09/04/2021, 12:02 PM ? ?

## 2021-09-04 NOTE — Progress Notes (Signed)
Subjective: Seen in room sitting up on edge of bed, said tolerated dialysis yesterday, states walking with the walker and no complaints currently ? ?Objective ?Vital signs in last 24 hours: ?Vitals:  ? 09/03/21 1030 09/03/21 1100 09/03/21 1134 09/03/21 2106  ?BP: (!) 116/48 (!) 127/45 (!) 123/49 (!) 128/59  ?Pulse: 71 68 74 81  ?Resp: 20 18 18 19   ?Temp:  (!) 97.1 ?F (36.2 ?C) 98 ?F (36.7 ?C) 98.5 ?F (36.9 ?C)  ?TempSrc:  Temporal Oral Oral  ?SpO2: 100% 100% 90%   ?Weight:  73 kg    ?Height:      ? ?Weight change: 1.425 kg ? ?Physical Exam: ?General: Sitting on edge of bed NAD  ?Heart: RRR no MRG appreciated ?Lungs: CTA nonlabored breathing ?Abdomen: NABS, S, NTND ?Extremities: Trace pedal edema left lower extremity left foot wrapped dressing dry clean right no pedal edema  ?dialysis Access: Positive bruit right upper arm AV fistula ? ?Dialysis Orders:  ?TTS - [place] ? 3hrs28min, BFR 350, DFR 600,  EDW 73.7kg, 2K/ 2.5Ca ?  ?Heparin 7000 unit bolus with HD ?Mircera 75 mcg q2wks - last 08/20/21 ?Hectorol 52mcg IV qHD- last 08/29/21 ?Venofer 100mg  IV X 5 doses-last dose 08/29/21: Needs 3 more doses ?Home meds: Midodrine 10mg  pre-HD; Sensipar 60mg  daily ? ?Problem/Plan: ?Left great toe osteomyelitis =antibiotics/plans per admit podiatry /cardiology =PVD procedure done 08/2721 following withs/p balloon cutting angioplasty L popliteal artery 08/31/21, s/p partial hallux amputation on Wed 09/02/21.  On vanc/ cefepime ?PVD=Ho bilat.SFA stent; back on ASA and Plavix , as above in #1- had L leg critical limb ischemia ?ESRD -HD TTS, on schedule yesterday 1 L UF lsightly below EDW ?HTN/volume -no excess volume on exam slightly below EDW ?Anemia -Hgb a.m. 7.4 as noted yesterday resumed short Fe load (ordered from outpatient setting); ESA 75 mcg Mircera given 08/20/21 , restart 100 ?Secondary hyperparathyroidism -a.m. labs phosphorus 3.9 corrected calcium stable on Sensipar and Hectorol ?Nutrition= renal diet /vitamin  /protein supplement ?Disposition= currently inpatient, she states she lives alone but has a daughter in town ? ?Ernest Haber, PA-C ?[facility] ?Beeper [phone] ?09/04/2021,8:03 AM ? LOS: 6 days  ? ?Labs: ?Basic Metabolic Panel: ?Recent Labs  ?Lab 09/02/21 ?1607 09/03/21 ?0303 09/04/21 ?0309  ?NA 137 135 138  ?K 4.3 4.6 3.5  ?CL 98 97* 100  ?CO2 25 26 29   ?GLUCOSE 90 105* 90  ?BUN 24* 31* 16  ?CREATININE 5.91* 7.18* 4.90*  ?CALCIUM 8.3* 7.6* 7.9*  ?PHOS 3.6 4.0 3.9  ? ?Liver Function Tests: ?Recent Labs  ?Lab 08/29/21 ?1719 09/02/21 ?3710 09/03/21 ?0303 09/04/21 ?0309  ?AST 34  --   --   --   ?ALT 23  --   --   --   ?ALKPHOS 94  --   --   --   ?BILITOT 0.5  --   --   --   ?PROT 7.7  --   --   --   ?ALBUMIN 2.6* 2.1* 1.8* 1.8*  ? ?No results for input(s): LIPASE, AMYLASE in the last 168 hours. ?No results for input(s): AMMONIA in the last 168 hours. ?CBC: ?Recent Labs  ?Lab 08/31/21 ?0324 09/01/21 ?0209 09/02/21 ?6269 09/03/21 ?0303 09/04/21 ?0309  ?WBC 13.4* 12.7* 13.8* 13.5* 14.5*  ?NEUTROABS  --   --  10.0* 9.9* 10.3*  ?HGB 7.9* 8.1* 8.3* 7.7* 7.4*  ?HCT 25.9* 25.8* 27.9* 25.5* 24.3*  ?MCV 95.2 93.5 95.2 94.4 94.9  ?PLT 344 356 399 321 339  ? ?  Cardiac Enzymes: ?No results for input(s): CKTOTAL, CKMB, CKMBINDEX, TROPONINI in the last 168 hours. ?CBG: ?Recent Labs  ?Lab 09/02/21 ?1148 09/03/21 ?0702 09/03/21 ?1152 09/03/21 ?1544 09/03/21 ?2129  ?GLUCAP 135* 89 94 141* 175*  ? ? ?Studies/Results: ?DG Foot Complete Left ? ?Result Date: 09/02/2021 ?CLINICAL DATA:  Status post toe amputation. EXAM: LEFT FOOT - COMPLETE 3+ VIEW COMPARISON:  August 29, 2021. FINDINGS: Status post surgical amputation of the first proximal and distal phalanges. Vascular calcifications are noted. No other bony abnormality is noted. IMPRESSION: Status post surgical amputation of first proximal and distal phalanges. Electronically Signed   By: Marijo Conception M.D.   On: 09/02/2021 15:00   ?Medications: ? sodium chloride    ?  sodium chloride    ? sodium chloride    ? sodium chloride 10 mL/hr at 09/02/21 1217  ? [START ON 09/05/2021] ceFEPime (MAXIPIME) IV    ? ferric gluconate (FERRLECIT) IVPB Stopped (09/03/21 1142)  ? vancomycin Stopped (09/03/21 1143)  ? ? aspirin EC  81 mg Oral Daily  ? atorvastatin  80 mg Oral Daily  ? calcium acetate  667 mg Oral TID WC  ? Chlorhexidine Gluconate Cloth  6 each Topical Q0600  ? cinacalcet  60 mg Oral Q supper  ? clopidogrel  75 mg Oral Q breakfast  ? doxercalciferol  2 mcg Intravenous Q T,Th,Sa-HD  ? gabapentin  100 mg Oral BID  ? heparin injection (subcutaneous)  5,000 Units Subcutaneous Q8H  ? multivitamin  1 tablet Oral QHS  ? nutrition supplement (JUVEN)  1 packet Oral BID BM  ? sodium chloride flush  3 mL Intravenous Q12H  ? ? ? ? ?

## 2021-09-04 NOTE — Progress Notes (Addendum)
Pt receives out-pt HD at [facility] on TTS. Pt arrives at 6:05 for 6:25 chair time. Contacted attending to inquire about possible d/c date in an attempt to avoid HD on day of d/c. Awaiting response. Will assist as needed.  ? ?Melven Sartorius ?Renal Navigator ?[phone] ? ?Addendum at 4:03 pm: ?Attending planning to d/c pt today. Contacted [name] and spoke to [place], Therapist, sports. Clinic aware pt to d/c today and will resume care tomorrow. Advised clinic that pt to need iv abx with HD per attending. Contacted renal PA to make sure he was aware of pt's d/c so orders can be sent to clinic for tomorrow.  ? ? ?

## 2021-09-04 NOTE — Progress Notes (Signed)
?  Subjective:  ?Patient ID: Joanne Carlson, female    DOB: [birth year],  MRN: [id number] ? ?A 76 y.o. female presents with left great toe amputation secondary to osteomyelitis.  She states she is doing well.  No acute complaints.  Dressings clean dry and intact. ? ?Objective:  ? ?Vitals:  ? 09/03/21 1134 09/03/21 2106  ?BP: (!) 123/49 (!) 128/59  ?Pulse: 74 81  ?Resp: 18 19  ?Temp: 98 ?F (36.7 ?C) 98.5 ?F (36.9 ?C)  ?SpO2: 90%   ? ?General AA&O x3. Normal mood and affect.  ?Vascular Dorsalis pedis and posterior tibial pulses 2/4 bilat. ?Brisk capillary refill to all digits. Pedal hair present.  ?Neurologic Epicritic sensation grossly intact.  ?Dermatologic Dressing is clean dry and intact.  No signs of infection noted.  No calf pain noted.  Left great toe partial hallux amputation  ?Orthopedic: MMT 5/5 in dorsiflexion, plantarflexion, inversion, and eversion. ?Normal joint ROM without pain or crepitus.  ? ? ?Assessment & Plan:  ?Patient was evaluated and treated and all questions answered. ? ?Left great toe partial hallux amputation ?-All questions and concerns were discussed with the patient in extensive detail ?-Patient is okay to be discharged from podiatric standpoint. ?-Antibiotics recommendation per ID ?-Weightbearing as tolerated in surgical shoe ?-Patient to follow-up with [place] in clinic 1 week from discharge. ? ?Felipa Furnace, DPM ? ?Accessible via secure chat for questions or concerns. ? ?

## 2021-09-05 DIAGNOSIS — N186 End stage renal disease: Secondary | ICD-10-CM | POA: Diagnosis not present

## 2021-09-05 DIAGNOSIS — N2581 Secondary hyperparathyroidism of renal origin: Secondary | ICD-10-CM | POA: Diagnosis not present

## 2021-09-05 DIAGNOSIS — Z992 Dependence on renal dialysis: Secondary | ICD-10-CM | POA: Diagnosis not present

## 2021-09-08 DIAGNOSIS — N186 End stage renal disease: Secondary | ICD-10-CM | POA: Diagnosis not present

## 2021-09-08 DIAGNOSIS — Z992 Dependence on renal dialysis: Secondary | ICD-10-CM | POA: Diagnosis not present

## 2021-09-08 DIAGNOSIS — N2581 Secondary hyperparathyroidism of renal origin: Secondary | ICD-10-CM | POA: Diagnosis not present

## 2021-09-09 DIAGNOSIS — Z4781 Encounter for orthopedic aftercare following surgical amputation: Secondary | ICD-10-CM | POA: Diagnosis not present

## 2021-09-09 DIAGNOSIS — M1990 Unspecified osteoarthritis, unspecified site: Secondary | ICD-10-CM | POA: Diagnosis not present

## 2021-09-09 DIAGNOSIS — E1142 Type 2 diabetes mellitus with diabetic polyneuropathy: Secondary | ICD-10-CM | POA: Diagnosis not present

## 2021-09-09 DIAGNOSIS — N186 End stage renal disease: Secondary | ICD-10-CM | POA: Diagnosis not present

## 2021-09-09 DIAGNOSIS — G9341 Metabolic encephalopathy: Secondary | ICD-10-CM | POA: Diagnosis not present

## 2021-09-09 DIAGNOSIS — E1152 Type 2 diabetes mellitus with diabetic peripheral angiopathy with gangrene: Secondary | ICD-10-CM | POA: Diagnosis not present

## 2021-09-09 DIAGNOSIS — M109 Gout, unspecified: Secondary | ICD-10-CM | POA: Diagnosis not present

## 2021-09-09 DIAGNOSIS — E1122 Type 2 diabetes mellitus with diabetic chronic kidney disease: Secondary | ICD-10-CM | POA: Diagnosis not present

## 2021-09-09 DIAGNOSIS — I12 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 stage renal disease: Secondary | ICD-10-CM | POA: Diagnosis not present

## 2021-09-10 DIAGNOSIS — N186 End stage renal disease: Secondary | ICD-10-CM | POA: Diagnosis not present

## 2021-09-10 DIAGNOSIS — N2581 Secondary hyperparathyroidism of renal origin: Secondary | ICD-10-CM | POA: Diagnosis not present

## 2021-09-10 DIAGNOSIS — Z992 Dependence on renal dialysis: Secondary | ICD-10-CM | POA: Diagnosis not present

## 2021-09-11 ENCOUNTER — Other Ambulatory Visit: Payer: Self-pay | Admitting: Cardiovascular Disease

## 2021-09-11 DIAGNOSIS — I739 Peripheral vascular disease, unspecified: Secondary | ICD-10-CM

## 2021-09-12 DIAGNOSIS — N186 End stage renal disease: Secondary | ICD-10-CM | POA: Diagnosis not present

## 2021-09-12 DIAGNOSIS — N2581 Secondary hyperparathyroidism of renal origin: Secondary | ICD-10-CM | POA: Diagnosis not present

## 2021-09-12 DIAGNOSIS — Z992 Dependence on renal dialysis: Secondary | ICD-10-CM | POA: Diagnosis not present

## 2021-09-14 ENCOUNTER — Ambulatory Visit: Payer: Medicare HMO | Admitting: Internal Medicine

## 2021-09-15 ENCOUNTER — Ambulatory Visit (INDEPENDENT_AMBULATORY_CARE_PROVIDER_SITE_OTHER): Payer: Medicare HMO | Admitting: Podiatry

## 2021-09-15 ENCOUNTER — Other Ambulatory Visit: Payer: Self-pay

## 2021-09-15 DIAGNOSIS — N186 End stage renal disease: Secondary | ICD-10-CM | POA: Diagnosis not present

## 2021-09-15 DIAGNOSIS — L97521 Non-pressure chronic ulcer of other part of left foot limited to breakdown of skin: Secondary | ICD-10-CM

## 2021-09-15 DIAGNOSIS — I70229 Atherosclerosis of native arteries of extremities with rest pain, unspecified extremity: Secondary | ICD-10-CM | POA: Diagnosis not present

## 2021-09-15 DIAGNOSIS — Z992 Dependence on renal dialysis: Secondary | ICD-10-CM | POA: Diagnosis not present

## 2021-09-15 DIAGNOSIS — I96 Gangrene, not elsewhere classified: Secondary | ICD-10-CM

## 2021-09-15 DIAGNOSIS — N2581 Secondary hyperparathyroidism of renal origin: Secondary | ICD-10-CM | POA: Diagnosis not present

## 2021-09-15 NOTE — Progress Notes (Signed)
Subjective: ?76 year old female presents the office today for follow-up evaluation of undergoing hallux amputation with Dr. Posey Pronto on September 02, 2021.  States that she has been doing well with little discomfort and that pain level is 2/10.  She is been wearing a surgical shoe to try and limit activity.  No fevers or chills.  No other concerns. ? ?Objective: ?AAO x3, NAD ?Status post hallux amputation with sutures intact.  Small superficial area skin breakdown with granular tissue but there is no probing, no tunneling.  This appears to be limited to the breakdown of skin.  There is minimal edema.  There is no erythema or warmth.  No drainage or pus.  No fluctuation or crepitation.  There is no malodor. ?No pain with calf compression, swelling, warmth, erythema ? ? ? ? ? ? ?Assessment: ?Status post hallux amputation ? ?Plan: ?-All treatment options discussed with the patient including all alternatives, risks, complications.  ?-I cleaned the wound today.  There is 1 superficial area skin breakdown otherwise appears to be healing well.  Small mount of Betadine was applied followed by dressing.  Discussed regular dressing changes at home as well.  Continue surgical shoe for offloading, elevation. ?-We will finish course of antibiotics at dialysis. ?-Follow-up with Dr. Alvester Chou as scheduled. ?-Monitor for any clinical signs or symptoms of infection and directed to call the office immediately should any occur or go to the ER. ?-Patient encouraged to call the office with any questions, concerns, change in symptoms.  ? ?Return in about 1 week (around 09/22/2021). ?

## 2021-09-17 DIAGNOSIS — N186 End stage renal disease: Secondary | ICD-10-CM | POA: Diagnosis not present

## 2021-09-17 DIAGNOSIS — Z992 Dependence on renal dialysis: Secondary | ICD-10-CM | POA: Diagnosis not present

## 2021-09-17 DIAGNOSIS — N2581 Secondary hyperparathyroidism of renal origin: Secondary | ICD-10-CM | POA: Diagnosis not present

## 2021-09-19 DIAGNOSIS — N186 End stage renal disease: Secondary | ICD-10-CM | POA: Diagnosis not present

## 2021-09-19 DIAGNOSIS — N2581 Secondary hyperparathyroidism of renal origin: Secondary | ICD-10-CM | POA: Diagnosis not present

## 2021-09-19 DIAGNOSIS — Z992 Dependence on renal dialysis: Secondary | ICD-10-CM | POA: Diagnosis not present

## 2021-09-21 DIAGNOSIS — R310 Gross hematuria: Secondary | ICD-10-CM | POA: Diagnosis not present

## 2021-09-21 DIAGNOSIS — C678 Malignant neoplasm of overlapping sites of bladder: Secondary | ICD-10-CM | POA: Diagnosis not present

## 2021-09-21 DIAGNOSIS — K429 Umbilical hernia without obstruction or gangrene: Secondary | ICD-10-CM | POA: Diagnosis not present

## 2021-09-22 ENCOUNTER — Other Ambulatory Visit: Payer: Self-pay | Admitting: Urology

## 2021-09-22 DIAGNOSIS — Z992 Dependence on renal dialysis: Secondary | ICD-10-CM | POA: Diagnosis not present

## 2021-09-22 DIAGNOSIS — N186 End stage renal disease: Secondary | ICD-10-CM | POA: Diagnosis not present

## 2021-09-22 DIAGNOSIS — N2581 Secondary hyperparathyroidism of renal origin: Secondary | ICD-10-CM | POA: Diagnosis not present

## 2021-09-23 ENCOUNTER — Telehealth: Payer: Self-pay | Admitting: Sports Medicine

## 2021-09-23 NOTE — Telephone Encounter (Signed)
Patient called answering service and is concerned about her foot. States that she has been dressing it as directed but it seems like the stitches are coming out and she is seeing white on the skin. Patient denies any other symptoms at this time. I advised patient that we will get her in to be seen in the office in the AM for an evaluation with any provider in the [facility] office since Dr. Posey Pronto will be in [place] tomorrow; a scheduler will call her to make this appt. Patient states that she will try to find a ride.  ?-Dr. Cannon Kettle ?

## 2021-09-24 ENCOUNTER — Ambulatory Visit (INDEPENDENT_AMBULATORY_CARE_PROVIDER_SITE_OTHER): Payer: Medicare HMO | Admitting: Cardiovascular Disease

## 2021-09-24 ENCOUNTER — Encounter: Payer: Self-pay | Admitting: Cardiovascular Disease

## 2021-09-24 ENCOUNTER — Ambulatory Visit (INDEPENDENT_AMBULATORY_CARE_PROVIDER_SITE_OTHER): Payer: Medicare HMO

## 2021-09-24 ENCOUNTER — Other Ambulatory Visit: Payer: Self-pay

## 2021-09-24 ENCOUNTER — Ambulatory Visit (INDEPENDENT_AMBULATORY_CARE_PROVIDER_SITE_OTHER): Payer: Medicare HMO | Admitting: Sports Medicine

## 2021-09-24 DIAGNOSIS — I10 Essential (primary) hypertension: Secondary | ICD-10-CM | POA: Diagnosis not present

## 2021-09-24 DIAGNOSIS — I739 Peripheral vascular disease, unspecified: Secondary | ICD-10-CM

## 2021-09-24 DIAGNOSIS — Z9889 Other specified postprocedural states: Secondary | ICD-10-CM | POA: Diagnosis not present

## 2021-09-24 DIAGNOSIS — I70229 Atherosclerosis of native arteries of extremities with rest pain, unspecified extremity: Secondary | ICD-10-CM

## 2021-09-24 DIAGNOSIS — E7800 Pure hypercholesterolemia, unspecified: Secondary | ICD-10-CM

## 2021-09-24 DIAGNOSIS — L97521 Non-pressure chronic ulcer of other part of left foot limited to breakdown of skin: Secondary | ICD-10-CM

## 2021-09-24 DIAGNOSIS — R239 Unspecified skin changes: Secondary | ICD-10-CM

## 2021-09-24 DIAGNOSIS — N186 End stage renal disease: Secondary | ICD-10-CM | POA: Diagnosis not present

## 2021-09-24 DIAGNOSIS — N2581 Secondary hyperparathyroidism of renal origin: Secondary | ICD-10-CM | POA: Diagnosis not present

## 2021-09-24 DIAGNOSIS — Z992 Dependence on renal dialysis: Secondary | ICD-10-CM | POA: Diagnosis not present

## 2021-09-24 NOTE — Patient Instructions (Signed)
Medication Instructions:  ?Your physician recommends that you continue on your current medications as directed. Please refer to the Current Medication list given to you today. ? ?*If you need a refill on your cardiac medications before your next appointment, please call your pharmacy* ? ? ?Testing/Procedures: ?Your physician has requested that you have a lower extremity arterial duplex. This test is an ultrasound of the arteries in the legs. It looks at arterial blood flow in the legs. Allow one hour for Lower Arterial scans. There are no restrictions or special instructions ? ?Your physician has requested that you have an ankle brachial index (ABI). During this test an ultrasound and blood pressure cuff are used to evaluate the arteries that supply the arms and legs with blood. Allow thirty minutes for this exam. There are no restrictions or special instructions.  ?These procedures will be done at [place] ? ?Follow-Up: ?At [facility], you and your health needs are our priority.  As part of our continuing mission to provide you with exceptional heart care, we have created designated Provider Care Teams.  These Care Teams include your primary Cardiologist (physician) and Advanced Practice Providers (APPs -  Physician Assistants and Nurse Practitioners) who all work together to provide you with the care you need, when you need it. ? ?We recommend signing up for the patient portal called "MyChart".  Sign up information is provided on this After Visit Summary.  MyChart is used to connect with patients for Virtual Visits (Telemedicine).  Patients are able to view lab/test results, encounter notes, upcoming appointments, etc.  Non-urgent messages can be sent to your provider as well.   ?To learn more about what you can do with MyChart, go to [email or web address].   ? ?Your next appointment:   ?6 month(s) ? ?The format for your next appointment:   ?In Person ? ?Provider:   ?[name], MD  ?

## 2021-09-24 NOTE — Progress Notes (Signed)
? ? ? ?09/24/2021 ?Joanne Carlson   ?08-Apr-1946  ?[id number] ? ?Primary Physician Joanne Shanks, MD ?Primary Cardiologist: Joanne Harp MD Joanne Carlson, [place] ? ?HPI:  Joanne Carlson is a 76 y.o.  mother of 2 children referred by Dr. Amalia Carlson for peripheral vascular evaluation because of critical limb ischemia.  I last saw her in the office 07/17/2021.  She has a history of treated hypertension, diabetes and hyperlipidemia. 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 Dr. Adele Carlson placed an AV fistula. She's never had a heart attack or stroke. She denies chest pain or shortness of breath. She's had a small non-healing ulcer on her left great toe treated with antibiotics recently and according to her this is slowly healing. Lower extremity arterial Doppler studies performed in our office 01/03/17 revealed ABIs approximately 0.8 bilaterally with an occluded distal right SFA and high-grade left SFA stenosis with three-vessel runoff. ?  ?She had developed a nonhealing wound on her right great toe.  She is currently on hemodialysis.  I angiogram to her 01/30/2018 revealing a 99% calcified mid right SFA stenosis.  I performed chocolate balloon atherectomy followed by drug-eluting balloon angioplasty with excellent result.  Dopplers improved.  Her wound ultimately healed. ?  ?I performed peripheral angiography on her 03/03/2020 revealing minimal disease in the mid and distal right SFA in the 50% range, patent left external iliac artery stent with high-grade segmental calcified proximal mid left SFA stenosis.  She underwent shockwave intravascular lithotripsy and drug-coated balloon angioplasty with excellent result.  Follow-up Dopplers showed marked improvement as did her claudication.  She did complain of some pain in her right inguinal area which does not sound vascular. ? ?She works at [employer] 3 days a week on the treadmill and denies claudication.  Joanne Carlson, her podiatrist, is  still working on the dry gangrene at the tip of her left toe.  Her last Doppler studies reveal her left ABI to be noncompressible although her last angiogram performed 03/03/2020 revealed a patent left iliac stent.  I performed shockwave angioplasty followed by [facility] of her entire proximal and mid left SFA with excellent result.  She did have three-vessel runoff.  She is scheduled to have cystoscopy and potential urologic surgery on bladder tumors because of hematuria next week.  I believe it would be safe for her to come off her antiplatelet therapy prior to that.  She denies chest pain or shortness of breath. ? ?She was admitted 08/29/2021 with critical limb ischemia and gangrene of her left great toe.  2 days later I performed peripheral angiography and intervention on a 99% left popliteal artery stenosis with an excellent result.  Several days after that she underwent left great toe amputation by Dr. Posey Carlson with excellent salt.  She was placed on antibiotics.  Today she says she has some pus drainage from her left great toe and is scheduled to see Dr. Posey Carlson in the office. ? ? ?Current Meds  ?Medication Sig  ? acetaminophen (TYLENOL) 325 MG tablet Take 2 tablets (650 mg total) by mouth every 4 (four) hours as needed for headache or mild pain.  ? allopurinol (ZYLOPRIM) 100 MG tablet Take 100 mg by mouth daily.  ? aspirin EC 81 MG tablet Take 81 mg by mouth daily.  ? atorvastatin (LIPITOR) 80 MG tablet TAKE 1 TABLET BY MOUTH ONCE DAILY AT  6:00 PM (Patient taking differently: Take 80 mg by mouth daily.)  ?  Blood Glucose Monitoring Suppl (ACCU-CHEK AVIVA PLUS) w/Device KIT   ? calcium acetate (PHOSLO) 667 MG capsule Take 667 mg by mouth 3 (three) times daily with meals.   ? cinacalcet (SENSIPAR) 60 MG tablet Take 60 mg by mouth daily.   ? clopidogrel (PLAVIX) 75 MG tablet Take 75 mg by mouth daily.  ? gabapentin (NEURONTIN) 100 MG capsule Take 1 capsule by mouth every morning and 1 capsule at bedtime. PATIENT NEEDS  OFFICE VISIT FOR ADDITIONAL REFILLS (Patient taking differently: Take 100-200 mg by mouth at bedtime.)  ? glucose blood test strip Use to test blood sugar daily. Dx code: 16.02.  ? Lancets MISC Use to test blood sugar daily. Dx code 250.02.  ? lidocaine-prilocaine (EMLA) cream Apply 1 application topically Every Tuesday,Thursday,and Saturday with dialysis.   ? multivitamin (RENA-VIT) TABS tablet Take 1 tablet by mouth daily.  ? oxybutynin (DITROPAN) 5 MG tablet Take 1 tablet (5 mg total) by mouth 3 (three) times daily as needed for bladder spasms.  ? traMADol (ULTRAM) 50 MG tablet   ?  ? ?Allergies  ?Allergen Reactions  ? Tylenol [Acetaminophen] Rash  ? ? ?Social History  ? ?Socioeconomic History  ? Marital status: Widowed  ?  Spouse name: Not on file  ? Number of children: Not on file  ? Years of education: Not on file  ? Highest education level: Not on file  ?Occupational History  ? Not on file  ?Tobacco Use  ? Smoking status: Former  ?  Packs/day: 0.25  ?  Years: 10.00  ?  Pack years: 2.50  ?  Types: Cigarettes  ?  Quit date: 1970  ?  Years since quitting: 53.2  ? Smokeless tobacco: Never  ? Tobacco comments:  ?  "never a heavy smoker; smoked off and on"  ?Vaping Use  ? Vaping Use: Never used  ?Substance and Sexual Activity  ? Alcohol use: Not Currently  ?  Comment: "in my younger days"  ? Drug use: Not Currently  ?  Types: Marijuana  ?  Comment: "weed in my teens"  ? Sexual activity: Not Currently  ?  Birth control/protection: Post-menopausal  ?Other Topics Concern  ? Not on file  ?Social History Narrative  ? Not on file  ? ?Social Determinants of Health  ? ?Financial Resource Strain: Not on file  ?Food Insecurity: Not on file  ?Transportation Needs: Not on file  ?Physical Activity: Not on file  ?Stress: Not on file  ?Social Connections: Not on file  ?Intimate Partner Violence: Not on file  ?  ? ?Review of Systems: ?General: negative for chills, fever, night sweats or weight changes.  ?Cardiovascular: negative  for chest pain, dyspnea on exertion, edema, orthopnea, palpitations, paroxysmal nocturnal dyspnea or shortness of breath ?Dermatological: negative for rash ?Respiratory: negative for cough or wheezing ?Urologic: negative for hematuria ?Abdominal: negative for nausea, vomiting, diarrhea, bright red blood per rectum, melena, or hematemesis ?Neurologic: negative for visual changes, syncope, or dizziness ?All other systems reviewed and are otherwise negative except as noted above. ? ? ? ?Blood pressure 120/64, pulse 84, height _0  (1.626 m), weight 160 lb 12.8 oz (72.9 kg), SpO2 97 %.  ?General appearance: alert and no distress ?Neck: no adenopathy, no JVD, supple, symmetrical, trachea midline, thyroid not enlarged, symmetric, no tenderness/mass/nodules, and bilateral carotid bruits ?Lungs: clear to auscultation bilaterally ?Heart: regular rate and rhythm, S1, S2 normal, no murmur, click, rub or gallop ?Extremities: extremities normal, atraumatic, no cyanosis or edema ?Pulses: 2+  and symmetric ?Skin: Skin color, texture, turgor normal. No rashes or lesions ?Neurologic: Grossly normal ? ?EKG not performed today ? ?ASSESSMENT AND PLAN:  ? ?HYPERCHOLESTEROLEMIA ?History of hyperlipidemia on high-dose atorvastatin with lipid profile performed 08/24/2021 revealing total cholesterol 77, LDL 39 and HDL 17. ? ?HYPERTENSION, BENIGN ?History of essential hypertension a blood pressure measured today at 120/64.  She is not on antihypertensive medications. ? ?Critical lower limb ischemia ?History of critical limb ischemia status post peripheral angiography by myself 01/30/2018 revealing 99% calcified mid right SFA stenosis.  I performed chocolate balloon atherectomy followed by drug-coated balloon angioplasty with excellent result.  Dopplers improved and her wounds ultimately healed.  I performed peripheral angiography on her 03/03/2020 revealing minimal disease in the mid and distal right SFA, in the 50% range, patent left  extrailiac artery stent with high-grade segmental calcified proximal and mid left SFA stenosis.  She underwent shockwave balloon lithotripsy followed by [facility] with an excellent result.  Her claudication improved.  She

## 2021-09-24 NOTE — Assessment & Plan Note (Signed)
History of critical limb ischemia status post peripheral angiography by myself 01/30/2018 revealing 99% calcified mid right SFA stenosis.  I performed chocolate balloon atherectomy followed by drug-coated balloon angioplasty with excellent result.  Dopplers improved and her wounds ultimately healed.  I performed peripheral angiography on her 03/03/2020 revealing minimal disease in the mid and distal right SFA, in the 50% range, patent left extrailiac artery stent with high-grade segmental calcified proximal and mid left SFA stenosis.  She underwent shockwave balloon lithotripsy followed by [facility] with an excellent result.  Her claudication improved.  She was admitted to the hospital without gangrenous left great toe on 08/29/2021.  I performed angiography on her 2 days later revealed a 99% left popliteal artery stenosis which I intervened on with an excellent result.  Several days later she underwent left great toe amputation by Dr. Posey Pronto.  She is scheduled to see him in the office today.  I am going to repeat lower extremity arterial Doppler studies. ?

## 2021-09-24 NOTE — Assessment & Plan Note (Signed)
History of hyperlipidemia on high-dose atorvastatin with lipid profile performed 08/24/2021 revealing total cholesterol 77, LDL 39 and HDL 17. ?

## 2021-09-24 NOTE — Progress Notes (Signed)
Subjective: ?Joanne Carlson is a 76 y.o. female patient seen today in office for left surgery site wound check. Patient is s/p left hallux amp 09/02/21 by Dr. Posey Pronto. She reports that she is worried that her sutures may be pulling out, has been using betadine. Patient denies pain at surgical site, denies calf pain, denies headache, chest pain, shortness of breath, nausea, vomiting, fever, or chills. ? ?Patient Active Problem List  ? Diagnosis Date Noted  ? Cellulitis   ? Medication monitoring encounter   ? Osteomyelitis ([place]) 08/29/2021  ? PVD (peripheral vascular disease) ([place]) 08/29/2021  ? Bladder tumor 07/22/2021  ? Aortic stenosis 04/08/2021  ? Lumbar radiculopathy 10/15/2020  ? Hypercalcemia 04/25/2020  ? Claudication in peripheral vascular disease ([place]) 03/03/2020  ? Pain of left hand 12/24/2019  ? Allergy, unspecified, initial encounter 02/05/2019  ? Anaphylactic shock, unspecified, initial encounter 02/05/2019  ? Encounter for removal of sutures 07/15/2018  ? Iron deficiency anemia, unspecified 02/27/2018  ? Atherosclerosis of native arteries of the extremities with gangrene ([place]) 11/23/2017  ? Critical lower limb ischemia ([place]) 01/07/2017  ? Shortness of breath 12/04/2016  ? Unspecified protein-calorie malnutrition ([place]) 12/02/2016  ? Encounter for immunization 11/22/2016  ? Dependence on renal dialysis ([place]) 11/16/2016  ? Coagulation defect, unspecified ([place]) 10/19/2016  ? Dorsalgia, unspecified 10/12/2016  ? Gout, unspecified 10/12/2016  ? Hyperkalemia 10/12/2016  ? 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 stage renal disease ([place]) 10/12/2016  ? Morbid (severe) obesity due to excess calories ([place]) 10/12/2016  ? 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 Chronic renal insufficiency, stage IV (severe) ([place]) 11/13/2013  ? End stage renal disease ([place]) 07/20/2013  ? Mechanical complication of other vascular device, implant, and graft 07/20/2013  ?  Other specified disorders of kidney and ureter 11/30/2011  ? History of recurrent transient ischemic attacks 09/27/2011  ? HYPERCHOLESTEROLEMIA 08/07/2008  ? ANEMIA 08/07/2008  ? RENAL INSUFFICIENCY 08/07/2008  ? ALLERGIC RHINITIS 03/04/2008  ? Type 2 diabetes mellitus without complication, with long-term current use of insulin ([place]) 03/15/2007  ? HYPERTENSION, BENIGN 03/15/2007  ? ? ?Current Outpatient Medications on File Prior to Visit  ?Medication Sig Dispense Refill  ? acetaminophen (TYLENOL) 325 MG tablet Take 2 tablets (650 mg total) by mouth every 4 (four) hours as needed for headache or mild pain.    ? allopurinol (ZYLOPRIM) 100 MG tablet Take 100 mg by mouth daily.    ? aspirin EC 81 MG tablet Take 81 mg by mouth daily.    ? atorvastatin (LIPITOR) 80 MG tablet TAKE 1 TABLET BY MOUTH ONCE DAILY AT  6:00 PM (Patient taking differently: Take 80 mg by mouth daily.) 90 tablet 0  ? Blood Glucose Monitoring Suppl (ACCU-CHEK AVIVA PLUS) w/Device KIT   0  ? calcium acetate (PHOSLO) 667 MG capsule Take 667 mg by mouth 3 (three) times daily with meals.     ? cinacalcet (SENSIPAR) 60 MG tablet Take 60 mg by mouth daily.     ? clopidogrel (PLAVIX) 75 MG tablet Take 75 mg by mouth daily.    ? gabapentin (NEURONTIN) 100 MG capsule Take 1 capsule by mouth every morning and 1 capsule at bedtime. PATIENT NEEDS OFFICE VISIT FOR ADDITIONAL REFILLS (Patient taking differently: Take 100-200 mg by mouth at bedtime.) 60 capsule 0  ? glucose blood test strip Use to test blood sugar daily. Dx code: 250.02. 100 each 3  ? Lancets MISC Use to test blood sugar  daily. Dx code 250.02. 100 each 3  ? lidocaine-prilocaine (EMLA) cream Apply 1 application topically Every Tuesday,Thursday,and Saturday with dialysis.   12  ? multivitamin (RENA-VIT) TABS tablet Take 1 tablet by mouth daily.    ? oxybutynin (DITROPAN) 5 MG tablet Take 1 tablet (5 mg total) by mouth 3 (three) times daily as needed for bladder spasms. 20 tablet 1  ? oxyCODONE  (ROXICODONE) 5 MG immediate release tablet Take 1 tablet (5 mg total) by mouth every 8 (eight) hours as needed. (Patient not taking: Reported on 09/24/2021) 15 tablet 0  ? traMADol (ULTRAM) 50 MG tablet     ? ?No current facility-administered medications on file prior to visit.  ? ? ?Allergies  ?Allergen Reactions  ? Tylenol [Acetaminophen] Rash  ? ? ?Objective: ?There were no vitals filed for this visit. ? ?General: No acute distress, AAOx3  ?Left foot: Sutures intact with a small area of gapping at the lateral aspect of the incision that measures less than 0.3cm limited to the skin, maceration appears to be improved, mild swelling to amputation stump, no edema, no erythema, no drainage, no other acute signs of infection noted,  No pain with palpation to left foot  ? ? ? ? ?Post Op Xray, left foot consistent with amputation status ? ?Assessment and Plan:  ?Problem List Items Addressed This Visit   ? ?  ? Cardiovascular and Mediastinum  ? PVD (peripheral vascular disease) ([place])  ? ?Other Visit Diagnoses   ? ? Post-operative state    -  Primary  ? Relevant Orders  ? DG Foot Complete Left  ? Maceration of periwound skin      ? Ulcer of left foot, limited to breakdown of skin ([place])      ? ?  ? ? ? ? ?-Patient seen and evaluated ?-Applied betadine and a small amount of iodosorb at the area of maceration with dry sterile dressing to surgical site left foot secured with ACE wrap and stockinet and advised patient to continue with changing the dressing daily  ?-Advised patient to continue with post-op shoe on left foot  ?-Advised patient to limit activity to necessity  ?-Advised patient to ice and elevate as directed ?-Continue with vascular follow up  ?-Will plan for post op check with Dr. Posey Pronto at next office visit. In the meantime, patient to call office if any issues or problems arise.  ? ?Landis Martins, DPM ? ?

## 2021-09-24 NOTE — Assessment & Plan Note (Signed)
History of essential hypertension a blood pressure measured today at 120/64.  She is not on antihypertensive medications. ?

## 2021-09-25 ENCOUNTER — Ambulatory Visit: Payer: Medicare HMO | Admitting: Podiatry

## 2021-09-25 DIAGNOSIS — E1140 Type 2 diabetes mellitus with diabetic neuropathy, unspecified: Secondary | ICD-10-CM | POA: Diagnosis not present

## 2021-09-25 DIAGNOSIS — I739 Peripheral vascular disease, unspecified: Secondary | ICD-10-CM | POA: Diagnosis not present

## 2021-09-25 DIAGNOSIS — C679 Malignant neoplasm of bladder, unspecified: Secondary | ICD-10-CM | POA: Diagnosis not present

## 2021-09-25 DIAGNOSIS — Z905 Acquired absence of kidney: Secondary | ICD-10-CM | POA: Diagnosis not present

## 2021-09-25 DIAGNOSIS — Z992 Dependence on renal dialysis: Secondary | ICD-10-CM | POA: Diagnosis not present

## 2021-09-25 DIAGNOSIS — N186 End stage renal disease: Secondary | ICD-10-CM | POA: Diagnosis not present

## 2021-09-25 DIAGNOSIS — Z8739 Personal history of other diseases of the musculoskeletal system and connective tissue: Secondary | ICD-10-CM | POA: Diagnosis not present

## 2021-09-25 DIAGNOSIS — S98112A Complete traumatic amputation of left great toe, initial encounter: Secondary | ICD-10-CM | POA: Diagnosis not present

## 2021-09-25 DIAGNOSIS — Z95828 Presence of other vascular implants and grafts: Secondary | ICD-10-CM | POA: Diagnosis not present

## 2021-09-26 DIAGNOSIS — Z992 Dependence on renal dialysis: Secondary | ICD-10-CM | POA: Diagnosis not present

## 2021-09-26 DIAGNOSIS — N186 End stage renal disease: Secondary | ICD-10-CM | POA: Diagnosis not present

## 2021-09-26 DIAGNOSIS — N2581 Secondary hyperparathyroidism of renal origin: Secondary | ICD-10-CM | POA: Diagnosis not present

## 2021-09-28 NOTE — Progress Notes (Addendum)
COVID Vaccine Completed: yes x4 ?Date COVID Vaccine completed: 10/09/19, 11/06/19 ?Has received booster: 03/22/20, 01/01/21 ?COVID vaccine manufacturer: [employer]  ? ?Date of COVID positive in last 90 days: no ? ?PCP - Yaakov Guthrie, MD ?Cardiologist - [name], MD ? ?Chest x-ray - n/a ?EKG - 07/17/21 Epic ?Stress Test - 10/11/11 CE ?ECHO - 11/17/20 Epic ?Cardiac Cath - n/a ?Pacemaker/ICD device last checked: n/a ?Spinal Cord Stimulator: n/a ? ?Bowel Prep - miralax, patient aware and has instructions ? ?Sleep Study - n/a ?CPAP -  ? ?Fasting Blood Sugar - 70-99 ?Checks Blood Sugar  usually checks every day, her machine broke 09/27/21. Planning on getting fixed ? ?Blood Thinner Instructions: Plavix, hold 5 ?Aspirin Instructions: ASA 81, pt unsure reports she has instructions at home ?Last Dose: Plavix 10-15-21 1000 ? ?Activity level: Can perform activities of daily living without stopping and without symptoms of chest pain or shortness of breath. No stairs due to toe, patient states she 'doesn't trust it'   ? ?Anesthesia review: HTN, ESRD on dialysis, PVD, atherosclerosis, DM2, anemia, SOB, stroke, non healing ulcer to let great toe, dialysis TThS, no blood transfusions, WBC 15.2 and Hbg 7.7 ? ?Patient denies shortness of breath, fever, cough and chest pain at PAT appointment ? ? ?Patient verbalized understanding of instructions that were given to them at the PAT appointment. Patient was also instructed that they will need to review over the PAT instructions again at home before surgery.  ?

## 2021-09-29 ENCOUNTER — Inpatient Hospital Stay: Payer: Medicare HMO | Admitting: Infectious Diseases

## 2021-09-29 ENCOUNTER — Other Ambulatory Visit (HOSPITAL_COMMUNITY): Payer: Self-pay

## 2021-09-29 DIAGNOSIS — N2581 Secondary hyperparathyroidism of renal origin: Secondary | ICD-10-CM | POA: Diagnosis not present

## 2021-09-29 DIAGNOSIS — Z992 Dependence on renal dialysis: Secondary | ICD-10-CM | POA: Diagnosis not present

## 2021-09-29 DIAGNOSIS — N186 End stage renal disease: Secondary | ICD-10-CM | POA: Diagnosis not present

## 2021-09-29 NOTE — Patient Instructions (Addendum)
DUE TO COVID-19 ONLY ONE VISITOR  (aged 77 and older)  IS ALLOWED TO COME WITH YOU AND STAY IN THE WAITING ROOM ONLY DURING PRE OP AND PROCEDURE.   ?**NO VISITORS ARE ALLOWED IN THE SHORT STAY AREA OR RECOVERY ROOM!!** ? ?IF YOU WILL BE ADMITTED INTO THE HOSPITAL YOU ARE ALLOWED ONLY TWO SUPPORT PEOPLE DURING VISITATION HOURS ONLY (7 AM -8PM)   ?The support person(s) must pass our screening, gel in and out, and wear a mask at all times, including in the patient?s room. ?Patients must also wear a mask when staff or their support person are in the room. ?Visitors GUEST BADGE MUST BE WORN VISIBLY  ?One adult visitor may remain with you overnight and MUST be in the room by 8 P.M. ?  ? ? Your procedure is scheduled on: 10/21/21 ? ? Report to [facility] Main Entrance ? ?  Report to admitting at 6:15 AM ? ? Call this number if you have problems the morning of surgery [phone] ? ? Do not eat food :After Midnight. ? ? After Midnight you may have the following liquids until 5:30 AM DAY OF SURGERY ? ?Water ?Black Coffee (sugar ok, NO MILK/CREAM OR CREAMERS)  ?Tea (sugar ok, NO MILK/CREAM OR CREAMERS) regular and decaf                             ?Plain Jell-O (NO RED)                                           ?Fruit ices (not with fruit pulp, NO RED)                                     ?Popsicles (NO RED)                                                                  ?Juice: apple, WHITE grape, WHITE cranberry ?Sports drinks like Gatorade (NO RED) ?Clear broth(vegetable,chicken,beef) ? ?FOLLOW BOWEL PREP AND ANY ADDITIONAL PRE OP INSTRUCTIONS YOU RECEIVED FROM YOUR SURGEON'S OFFICE!!! ?  ?  ?Oral Hygiene is also important to reduce your risk of infection.                                    ?Remember - BRUSH YOUR TEETH THE MORNING OF SURGERY WITH YOUR REGULAR TOOTHPASTE ? ? Take these medicines the morning of surgery with A SIP OF WATER: Allopurinol, Gabapentin, Oxybutynin.  ? ?DO NOT TAKE ANY ORAL DIABETIC  MEDICATIONS DAY OF YOUR SURGERY ? ?How to Manage Your Diabetes ?Before and After Surgery ? ?Why is it important to control my blood sugar before and after surgery? ?Improving blood sugar levels before and after surgery helps healing and can limit problems. ?A way of improving blood sugar control is eating a healthy diet by: ? Eating less sugar and carbohydrates ? Increasing activity/exercise ? Talking with your doctor about reaching your blood sugar  goals ?High blood sugars (greater than 180 mg/dL) can raise your risk of infections and slow your recovery, so you will need to focus on controlling your diabetes during the weeks before surgery. ?Make sure that the doctor who takes care of your diabetes knows about your planned surgery including the date and location. ? ?How do I manage my blood sugar before surgery? ?Check your blood sugar at least 4 times a day, starting 2 days before surgery, to make sure that the level is not too high or low. ?Check your blood sugar the morning of your surgery when you wake up and every 2 hours until you get to the Short Stay unit. ?If your blood sugar is less than 70 mg/dL, you will need to treat for low blood sugar: ?Do not take insulin. ?Treat a low blood sugar (less than 70 mg/dL) with ? cup of clear juice (cranberry or apple), 4 glucose tablets, OR glucose gel. ?Recheck blood sugar in 15 minutes after treatment (to make sure it is greater than 70 mg/dL). If your blood sugar is not greater than 70 mg/dL on recheck, call [phone] for further instructions. ?Report your blood sugar to the short stay nurse when you get to Short Stay. ? ?If you are admitted to the hospital after surgery: ?Your blood sugar will be checked by the staff and you will probably be given insulin after surgery (instead of oral diabetes medicines) to make sure you have good blood sugar levels. ?The goal for blood sugar control after surgery is 80-180 mg/dL. ? ?Reviewed and Endorsed by [facility] Patient  Education Committee, August 2015  ?                  ?           You may not have any metal on your body including hair pins, jewelry, and body piercing ? ?           Do not wear make-up, lotions, powders, perfumes, or deodorant ? ?Do not wear nail polish including gel and S&S, artificial/acrylic nails, or any other type of covering on natural nails including finger and toenails. If you have artificial nails, gel coating, etc. that needs to be removed by a nail salon please have this removed prior to surgery or surgery may need to be canceled/ delayed if the surgeon/ anesthesia feels like they are unable to be safely monitored.  ? ?Do not shave  48 hours prior to surgery.  ? ? Do not bring valuables to the hospital. [facility] NOT ?            RESPONSIBLE   FOR VALUABLES. ? ? Contacts, dentures or bridgework may not be worn into surgery. ? ? Bring small overnight bag day of surgery. ?  ? Special Instructions: Bring a copy of your healthcare power of attorney and living will documents         the day of surgery if you haven't scanned them before. ? ?            Please read over the following fact sheets you were given: IF [place] [phone]- Apolonio Schneiders ? ?   [facility] - Preparing for Surgery ?Before surgery, you can play an important role.  Because skin is not sterile, your skin needs to be as free of germs as possible.  You can reduce the number of germs on your skin by washing with CHG (chlorahexidine gluconate)  soap before surgery.  CHG is an antiseptic cleaner which kills germs and bonds with the skin to continue killing germs even after washing. ?Please DO NOT use if you have an allergy to CHG or antibacterial soaps.  If your skin becomes reddened/irritated stop using the CHG and inform your nurse when you arrive at Short Stay. ?Do not shave (including legs and underarms) for at least 48 hours prior to the first CHG shower.  You may shave your  face/neck. ? ?Please follow these instructions carefully: ? 1.  Shower with CHG Soap the night before surgery and the  morning of surgery. ? 2.  If you choose to wash your hair, wash your hair first as usual with your normal  shampoo. ? 3.  After you shampoo, rinse your hair and body thoroughly to remove the shampoo.                            ? 4.  Use CHG as you would any other liquid soap.  You can apply chg directly to the skin and wash.  Gently with a scrungie or clean washcloth. ? 5.  Apply the CHG Soap to your body ONLY FROM THE NECK DOWN.   Do   not use on face/ open      ?                     Wound or open sores. Avoid contact with eyes, ears mouth and   genitals (private parts).  ?                     Production manager,  Genitals (private parts) with your normal soap. ?            6.  Wash thoroughly, paying special attention to the area where your    surgery  will be performed. ? 7.  Thoroughly rinse your body with warm water from the neck down. ? 8.  DO NOT shower/wash with your normal soap after using and rinsing off the CHG Soap. ?               9.  Pat yourself dry with a clean towel. ?           10.  Wear clean pajamas. ?           11.  Place clean sheets on your bed the night of your first shower and do not  sleep with pets. ?Day of Surgery : ?Do not apply any lotions/deodorants the morning of surgery.  Please wear clean clothes to the hospital/surgery center. ? ?FAILURE TO FOLLOW THESE INSTRUCTIONS MAY RESULT IN THE CANCELLATION OF YOUR SURGERY ? ?PATIENT SIGNATURE_________________________________ ? ?NURSE SIGNATURE__________________________________ ? ?________________________________________________________________________  ?WHAT IS A BLOOD TRANSFUSION? Blood Transfusion Information ? ?A transfusion is the replacement of blood or some of its parts. Blood is made up of multiple cells which provide different functions. ?Red blood cells carry oxygen and are used for blood loss replacement. ?White blood cells  fight against infection. ?Platelets control bleeding. ?Plasma helps clot blood. ?Other blood products are available for specialized needs, such as hemophilia or other clotting disorders. ?BEFORE THE TRANSFUSION  ?Wh

## 2021-09-30 ENCOUNTER — Encounter (HOSPITAL_COMMUNITY)
Admission: RE | Admit: 2021-09-30 | Discharge: 2021-09-30 | Disposition: A | Discharge status: Home / Self Care | Payer: Medicare HMO | Source: Ambulatory Visit | Attending: Urology | Admitting: Urology

## 2021-09-30 ENCOUNTER — Other Ambulatory Visit: Payer: Self-pay

## 2021-09-30 ENCOUNTER — Encounter (HOSPITAL_COMMUNITY): Payer: Self-pay

## 2021-09-30 VITALS — BP 139/54 | HR 75 | Temp 98.6°F | Resp 18 | Ht 64.0 in | Wt 155.2 lb

## 2021-09-30 DIAGNOSIS — Z87891 Personal history of nicotine dependence: Secondary | ICD-10-CM | POA: Diagnosis not present

## 2021-09-30 DIAGNOSIS — Z01812 Encounter for preprocedural laboratory examination: Secondary | ICD-10-CM | POA: Diagnosis not present

## 2021-09-30 DIAGNOSIS — N186 End stage renal disease: Secondary | ICD-10-CM | POA: Diagnosis not present

## 2021-09-30 DIAGNOSIS — Z7901 Long term (current) use of anticoagulants: Secondary | ICD-10-CM | POA: Diagnosis not present

## 2021-09-30 DIAGNOSIS — I12 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 stage renal disease: Secondary | ICD-10-CM | POA: Insufficient documentation

## 2021-09-30 DIAGNOSIS — E1151 Type 2 diabetes mellitus with diabetic peripheral angiopathy without gangrene: Secondary | ICD-10-CM | POA: Insufficient documentation

## 2021-09-30 DIAGNOSIS — E1122 Type 2 diabetes mellitus with diabetic chronic kidney disease: Secondary | ICD-10-CM | POA: Diagnosis not present

## 2021-09-30 DIAGNOSIS — Z992 Dependence on renal dialysis: Secondary | ICD-10-CM | POA: Insufficient documentation

## 2021-09-30 DIAGNOSIS — Z89412 Acquired absence of left great toe: Secondary | ICD-10-CM | POA: Diagnosis not present

## 2021-09-30 DIAGNOSIS — Z01818 Encounter for other preprocedural examination: Secondary | ICD-10-CM

## 2021-09-30 DIAGNOSIS — I350 Nonrheumatic aortic (valve) stenosis: Secondary | ICD-10-CM | POA: Diagnosis not present

## 2021-09-30 DIAGNOSIS — Z794 Long term (current) use of insulin: Secondary | ICD-10-CM

## 2021-09-30 LAB — CBC
HCT: 25.7 % — ABNORMAL LOW (ref 36.0–46.0)
Hemoglobin: 7.7 g/dL — ABNORMAL LOW (ref 12.0–15.0)
MCH: 29.2 pg (ref 26.0–34.0)
MCHC: 30 g/dL (ref 30.0–36.0)
MCV: 97.3 fL (ref 80.0–100.0)
Platelets: 348 10*3/uL (ref 150–400)
RBC: 2.64 MIL/uL — ABNORMAL LOW (ref 3.87–5.11)
RDW: 17.3 % — ABNORMAL HIGH (ref 11.5–15.5)
WBC: 15.2 10*3/uL — ABNORMAL HIGH (ref 4.0–10.5)
nRBC: 0 % (ref 0.0–0.2)

## 2021-09-30 LAB — HEMOGLOBIN A1C
Hgb A1c MFr Bld: 5 % (ref 4.8–5.6)
Mean Plasma Glucose: 96.8 mg/dL

## 2021-09-30 LAB — GLUCOSE, CAPILLARY: Glucose-Capillary: 73 mg/dL (ref 70–99)

## 2021-09-30 LAB — NO BLOOD PRODUCTS

## 2021-09-30 NOTE — Progress Notes (Signed)
WBC 15.2 and Hgb 7.7. results routed to Dr. Tresa Moore. ?

## 2021-10-01 ENCOUNTER — Encounter (HOSPITAL_COMMUNITY): Payer: Self-pay | Admitting: Physician Assistant

## 2021-10-01 DIAGNOSIS — Z992 Dependence on renal dialysis: Secondary | ICD-10-CM | POA: Diagnosis not present

## 2021-10-01 DIAGNOSIS — N186 End stage renal disease: Secondary | ICD-10-CM | POA: Diagnosis not present

## 2021-10-01 DIAGNOSIS — N2581 Secondary hyperparathyroidism of renal origin: Secondary | ICD-10-CM | POA: Diagnosis not present

## 2021-10-02 ENCOUNTER — Ambulatory Visit (INDEPENDENT_AMBULATORY_CARE_PROVIDER_SITE_OTHER): Payer: Medicare HMO | Admitting: Infectious Diseases

## 2021-10-02 ENCOUNTER — Other Ambulatory Visit: Payer: Self-pay

## 2021-10-02 ENCOUNTER — Inpatient Hospital Stay: Payer: Medicare HMO | Admitting: Infectious Diseases

## 2021-10-02 ENCOUNTER — Encounter: Payer: Self-pay | Admitting: Infectious Diseases

## 2021-10-02 VITALS — BP 131/47 | HR 86 | Temp 97.8°F | Wt 160.0 lb

## 2021-10-02 DIAGNOSIS — L089 Local infection of the skin and subcutaneous tissue, unspecified: Secondary | ICD-10-CM | POA: Diagnosis not present

## 2021-10-02 DIAGNOSIS — E1122 Type 2 diabetes mellitus with diabetic chronic kidney disease: Secondary | ICD-10-CM | POA: Diagnosis not present

## 2021-10-02 DIAGNOSIS — E11628 Type 2 diabetes mellitus with other skin complications: Secondary | ICD-10-CM

## 2021-10-02 DIAGNOSIS — Z992 Dependence on renal dialysis: Secondary | ICD-10-CM | POA: Diagnosis not present

## 2021-10-02 DIAGNOSIS — Z5181 Encounter for therapeutic drug level monitoring: Secondary | ICD-10-CM

## 2021-10-02 DIAGNOSIS — N186 End stage renal disease: Secondary | ICD-10-CM | POA: Diagnosis not present

## 2021-10-02 DIAGNOSIS — M869 Osteomyelitis, unspecified: Secondary | ICD-10-CM | POA: Diagnosis not present

## 2021-10-02 DIAGNOSIS — I739 Peripheral vascular disease, unspecified: Secondary | ICD-10-CM | POA: Diagnosis not present

## 2021-10-02 HISTORY — DX: Local infection of the skin and subcutaneous tissue, unspecified: L08.9

## 2021-10-02 HISTORY — DX: Local infection of the skin and subcutaneous tissue, unspecified: E11.628

## 2021-10-02 NOTE — Progress Notes (Signed)
? ?   ? ? ? ? ?Patient Active Problem List  ? Diagnosis Date Noted  ? Cellulitis   ? Medication monitoring encounter   ? Osteomyelitis ([place]) 08/29/2021  ? PVD (peripheral vascular disease) ([place]) 08/29/2021  ? Bladder tumor 07/22/2021  ? Aortic stenosis 04/08/2021  ? Lumbar radiculopathy 10/15/2020  ? Hypercalcemia 04/25/2020  ? Claudication in peripheral vascular disease ([place]) 03/03/2020  ? Pain of left hand 12/24/2019  ? Allergy, unspecified, initial encounter 02/05/2019  ? Anaphylactic shock, unspecified, initial encounter 02/05/2019  ? Encounter for removal of sutures 07/15/2018  ? Iron deficiency anemia, unspecified 02/27/2018  ? Atherosclerosis of native arteries of the extremities with gangrene ([place]) 11/23/2017  ? Critical lower limb ischemia ([place]) 01/07/2017  ? Shortness of breath 12/04/2016  ? Unspecified protein-calorie malnutrition ([place]) 12/02/2016  ? Encounter for immunization 11/22/2016  ? Dependence on renal dialysis ([place]) 11/16/2016  ? Coagulation defect, unspecified ([place]) 10/19/2016  ? Dorsalgia, unspecified 10/12/2016  ? Gout, unspecified 10/12/2016  ? Hyperkalemia 10/12/2016  ? 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 Morbid (severe) obesity due to excess calories ([place]) 10/12/2016  ? Secondary hyperparathyroidism of renal origin ([place]) 10/12/2016  ? Chronic renal insufficiency, stage IV (severe) ([place]) 11/13/2013  ? End stage renal disease ([place]) 07/20/2013  ? Mechanical complication of other vascular device, implant, and graft 07/20/2013  ? Other specified disorders of kidney and ureter 11/30/2011  ? History of recurrent transient ischemic attacks 09/27/2011  ? HYPERCHOLESTEROLEMIA 08/07/2008  ? ANEMIA 08/07/2008  ? RENAL INSUFFICIENCY 08/07/2008  ? ALLERGIC RHINITIS 03/04/2008  ? Type 2 diabetes mellitus without complication, with long-term current use of insulin ([place]) 03/15/2007  ? HYPERTENSION, BENIGN 03/15/2007   ? ? ?Patient's Medications  ?New Prescriptions  ? No medications on file  ?Previous Medications  ? ACETAMINOPHEN (TYLENOL) 325 MG TABLET    Take 2 tablets (650 mg total) by mouth every 4 (four) hours as needed for headache or mild pain.  ? ALLOPURINOL (ZYLOPRIM) 100 MG TABLET    Take 100 mg by mouth daily.  ? ASPIRIN EC 81 MG TABLET    Take 81 mg by mouth 4 (four) times a week.  ? ATORVASTATIN (LIPITOR) 80 MG TABLET    TAKE 1 TABLET BY MOUTH ONCE DAILY AT  6:00 PM  ? BLOOD GLUCOSE MONITORING SUPPL (ACCU-CHEK AVIVA PLUS) W/DEVICE KIT      ? CALCIUM ACETATE (PHOSLO) 667 MG CAPSULE    Take 667 mg by mouth 2 (two) times daily with a meal.  ? CINACALCET (SENSIPAR) 60 MG TABLET    Take 60 mg by mouth every Monday, Wednesday, and Friday.  ? CLOPIDOGREL (PLAVIX) 75 MG TABLET    Take 75 mg by mouth daily.  ? GABAPENTIN (NEURONTIN) 100 MG CAPSULE    Take 1 capsule by mouth every morning and 1 capsule at bedtime. PATIENT NEEDS OFFICE VISIT FOR ADDITIONAL REFILLS  ? GLUCOSE BLOOD TEST STRIP    Use to test blood sugar daily. Dx code: 49.02.  ? LANCETS MISC    Use to test blood sugar daily. Dx code 75.02.  ? LIDOCAINE-PRILOCAINE (EMLA) CREAM    Apply 1 application topically Every Tuesday,Thursday,and Saturday with dialysis.   ? MULTIVITAMIN (RENA-VIT) TABS TABLET    Take 1 tablet by mouth daily.  ? OXYBUTYNIN (DITROPAN) 5 MG TABLET    Take 1 tablet (5 mg total) by mouth 3 (three) times daily as needed for  bladder spasms.  ? OXYCODONE (ROXICODONE) 5 MG IMMEDIATE RELEASE TABLET    Take 1 tablet (5 mg total) by mouth every 8 (eight) hours as needed.  ?Modified Medications  ? No medications on file  ?Discontinued Medications  ? No medications on file  ? ? ?Subjective: ?Here for HFU for left great toes wound/osteomyelitis in the setting of severe PAD of lower extremities s/p vascular intervention ( Balloon angioplasty followed by [facility] left P2 segment of popliteal artery 08/31/21) . 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 OR  note "  proximal margin of the bone appeared clear of osteomyelitis". Surgical path with grossly viable margins. She has completed 2 weeks of Vancomycin and cefepime with HD from 3/1. She has been closely following with Podiatry. Last visit with Dr Cannon Kettle "a small area of gapping at the lateral aspect of the incision that measures less than 0.3cm limited to the skin, maceration appears to be improved, mild swelling to amputation stump".   ? ?Denies fevers, chills, sweats ?Denies nausea, vomiting and diarrhea  ?Denies any pain/tenderness/swelling or drainage form the left great toe amputated site ? ?Review of Systems: all systems reviewed with pertinent positives and negatives as listed above ? ? ?Past Medical History:  ?Diagnosis Date  ? Arthritis   ? "legs, knees; not bad" (01/30/2018)  ? Dizziness   ? occasionally  ? ESRD (end stage renal disease) ([place])   ? [name]; TTS" (01/30/2018)  ? History of colon polyps   ? History of gout   ? takes Allopurinol daily (01/30/2018)  ? Hyperlipidemia   ? takes Pravastatin daily  ? Hypertension   ? takes Monopril daily  ? Joint pain   ? PAD (peripheral artery disease) ([place])   ? Peripheral neuropathy   ? Peripheral vascular disease ([place])   ? Pneumonia   ? Pre-diabetes   ? Refusal of blood transfusions as patient is Jehovah's Witness   ? NO BLOOD OR BLOOD PRODUCTS. Albumin okay.   ? Stroke [facility]) 1990s  ? "mini stroke; left lower mouth a little bit twisted since" (01/30/2018)  ? ?Past Surgical History:  ?Procedure Laterality Date  ? ABDOMINAL AORTOGRAM W/LOWER EXTREMITY N/A 01/30/2018  ? Procedure: ABDOMINAL AORTOGRAM W/LOWER EXTREMITY;  Surgeon: Lorretta Harp, MD;  Location: [facility];  Service: Cardiovascular;  Laterality: N/A;  ? ABDOMINAL AORTOGRAM W/LOWER EXTREMITY N/A 03/03/2020  ? Procedure: ABDOMINAL AORTOGRAM W/LOWER EXTREMITY;  Surgeon: Lorretta Harp, MD;  Location: [facility];  Service: Cardiovascular;  Laterality: N/A;  ? ABDOMINAL AORTOGRAM  W/LOWER EXTREMITY N/A 08/31/2021  ? Procedure: ABDOMINAL AORTOGRAM W/LOWER EXTREMITY;  Surgeon: Lorretta Harp, MD;  Location: [facility];  Service: Cardiovascular;  Laterality: N/A;  ? AMPUTATION Left 09/02/2021  ? Procedure: AMPUTATION GREAT TOE;  Surgeon: Felipa Furnace, DPM;  Location: [facility];  Service: Podiatry;  Laterality: Left;  ? APPENDECTOMY    ? BASCILIC VEIN TRANSPOSITION Right 10/03/2013  ? Procedure: BRACHIOCEPHALIC Arteriovenous Fistula ;  Surgeon: Mal Misty, MD;  Location: [facility];  Service: Vascular;  Laterality: Right;  ? BASCILIC VEIN TRANSPOSITION Right 11/17/2016  ? Procedure: BASCILIC VEIN TRANSPOSITION- RIGHT SINGLE STAGE;  Surgeon: Conrad [place], MD;  Location: [facility];  Service: Vascular;  Laterality: Right;  ? COLONOSCOPY    ? CYST EXCISION  1980's  ? cyst removed from lower abdomen  ? [place] OF UTERUS  1987  ? ESOPHAGOGASTRODUODENOSCOPY    ? PERIPHERAL VASCULAR BALLOON  ANGIOPLASTY Right 01/30/2018  ? Procedure: PERIPHERAL VASCULAR BALLOON ANGIOPLASTY;  Surgeon: Lorretta Harp, MD;  Location: [facility];  Service: Cardiovascular;  Laterality: Right;  superficial femoral  ? PERIPHERAL VASCULAR BALLOON ANGIOPLASTY Left 08/31/2021  ? Procedure: PERIPHERAL VASCULAR BALLOON ANGIOPLASTY;  Surgeon: Lorretta Harp, MD;  Location: [facility];  Service: Cardiovascular;  Laterality: Left;  ? PERIPHERAL VASCULAR INTERVENTION  03/03/2020  ? Procedure: PERIPHERAL VASCULAR INTERVENTION;  Surgeon: Lorretta Harp, MD;  Location: [facility];  Service: Cardiovascular;;  external iliac stent ?left sfa lithotripsy/ drug coated balloon  ? SHOULDER ARTHROSCOPY W/ ROTATOR CUFF REPAIR Right 2013  ? TRANSURETHRAL RESECTION OF BLADDER TUMOR N/A 07/22/2021  ? Procedure: TRANSURETHRAL RESECTION OF BLADDER TUMOR (TURBT);  Surgeon: Vira Agar, MD;  Location: [facility];  Service: Urology;  Laterality: N/A;  ? TUBAL LIGATION  1986  ? UPPER EXTREMITY VENOGRAPHY  Bilateral 11/08/2016  ? Procedure: Bilateral Upper Extremity Venography;  Surgeon: Conrad [place], MD;  Location: [facility];  Service: Cardiovascular;  Laterality: Bilateral;  ? ? ?Social History  ? ?Tobacco

## 2021-10-02 NOTE — Progress Notes (Addendum)
Anesthesia Chart Review: ? ? Case: [id number] Date/Time: 10/21/21 0815  ? Procedures:  ?    XI ROBOTIC ASSISTED LAPAROSCOPIC COMPLETE CYSTECT,NO  ILEAL CONDUIT ?    XI ROBOTIC ASSISTED LAPAROSCOPIC HYSTERECTOMY AND BILATERAL SALPINGECTOMY (Bilateral) ?    XI ROBOT ASSITED LAPAROSCOPIC NEPHROURETERECTOMY (Left) ?    LYMPH NODE DISSECTION (Bilateral) ?    OPEN HERNIA REPAIR UMBILICAL ADULT ?    CYSTOSCOPY WITH INJECTION OF INDOCYANINE GREEN DYE  ? Anesthesia type: General  ? Pre-op diagnosis: BLADDER CANCER, SOLITARY KIDNEY, END STAGE RENAL DISEASE, UMBILICAL HERNIA  ? Location: [facility] 03 / [facility]  ? Surgeons: Alexis Frock, MD  ? ?  ? ? ?DISCUSSION: ?Pt is 76 years old with hx former smoker with h/o HTN, PAD, ESRD (on dialysis T, Th, Sat), mild aortic valve stenosis (by 11/17/20 echo), bladder tumor  ? ?Hospitalized 2/25-09/04/21 for osteomyelitis, s/p L great toe amputation 09/02/21 ? ?Pt to hold plavix 5 days before surgery ? ?Hgb 7.7; this is consistent with recent prior results however baseline hgb appears to be ~10 ? ?Pt refuses blood products; she did receive aranesp 161mg on 09/05/21 ? ?Discussed case with Dr. RKalman Shan As pt refuses blood products, her hgb will need to be closer to her baseline, at 9.0 at least, before proceeding with surgery. I left a voicemail for Selita in Dr. MZettie Phooffice with this information.  ? ? ?VS: BP (!) 139/54   Pulse 75   Temp 37 ?C (Oral)   Resp 18   Ht 5' 4" (1.626 m)   Wt 70.4 kg   SpO2 100%   BMI 26.65 kg/m?  ? ?PROVIDERS: ?- PCP is WVernie Shanks MD ?- Cardiologist is JQuay Burow MD ? ?LABS: Labs reviewed: Acceptable for surgery. ?- Hgb 7.7; this is consistent with recent prior results however baseline hgb appears to be ~10 ? ?BMP to be obtained day of surgery ? ?(all labs ordered are listed, but only abnormal results are displayed) ? ?Labs Reviewed  ?CBC - Abnormal; Notable for the following components:  ?    Result Value  ? WBC 15.2 (*)   ? RBC 2.64 (*)   ?  Hemoglobin 7.7 (*)   ? HCT 25.7 (*)   ? RDW 17.3 (*)   ? All other components within normal limits  ?HEMOGLOBIN A1C  ?GLUCOSE, CAPILLARY  ?NO BLOOD PRODUCTS  ? ? ? ?EKG 07/17/21: NSR. Low voltage ? ? ?CV: ?PV angiogram, PV balloon angioplasty, abd aortogram 08/31/21:  ?- Critical limb ischemia with scattered calcified focal stenoses in the proximal, mid and distal left SFA as well as a 99% P2 segment left popliteal artery stenosis probably accounting for her diminished infrapopliteal blood flow and decreased left ABI.   ?- Successful Cutting Balloon angioplasty followed by DCB of 99% P2 segment left popliteal artery stenosis in the setting of critical ischemia.  The sheath will be removed once ACT falls below 170 and pressure held.  She will be treated with DAPT as well as ongoing wound care and antibiotic therapy.  She left the lab in stable condition ? ?Echo 11/17/20:  ?1. Left ventricular ejection fraction, by estimation, is 60 to 65%. The left ventricle has normal function. The left ventricle has no regional wall motion abnormalities. Left ventricular diastolic parameters are consistent with Grade I diastolic dysfunction (impaired relaxation). The average left ventricular global longitudinal strain is -22.9 %. The global longitudinal strain is normal.  ?2. Right ventricular systolic function is normal. The  right ventricular size is normal. There is normal pulmonary artery systolic pressure.  ?3. The mitral valve is normal in structure. Trivial mitral valve regurgitation. No evidence of mitral stenosis.  ?4. The aortic valve is calcified. There is moderate calcification of the aortic valve. There is moderate thickening of the aortic valve. Aortic valve regurgitation is not visualized. Mild aortic valve stenosis. Aortic valve area, by VTI measures 1.56 cm?Marland Kitchen Aortic valve mean gradient measures 11.0 mmHg. Aortic valve Vmax measures 2.23 m/s.  ?5. The inferior vena cava is normal in size with greater than 50%  respiratory variability, suggesting right atrial pressure of 3 mmHg.  ? ? ?Past Medical History:  ?Diagnosis Date  ? Arthritis   ? "legs, knees; not bad" (01/30/2018)  ? Diabetic foot infection ([place]) 10/02/2021  ? Dizziness   ? occasionally  ? ESRD (end stage renal disease) ([place])   ? [name]; TTS" (01/30/2018)  ? History of colon polyps   ? History of gout   ? takes Allopurinol daily (01/30/2018)  ? Hyperlipidemia   ? takes Pravastatin daily  ? Hypertension   ? takes Monopril daily  ? Joint pain   ? PAD (peripheral artery disease) ([place])   ? Peripheral neuropathy   ? Peripheral vascular disease ([place])   ? Pneumonia   ? Pre-diabetes   ? Refusal of blood transfusions as patient is Jehovah's Witness   ? NO BLOOD OR BLOOD PRODUCTS. Albumin okay.   ? Stroke [facility]) 1990s  ? "mini stroke; left lower mouth a little bit twisted since" (01/30/2018)  ? ? ?Past Surgical History:  ?Procedure Laterality Date  ? ABDOMINAL AORTOGRAM W/LOWER EXTREMITY N/A 01/30/2018  ? Procedure: ABDOMINAL AORTOGRAM W/LOWER EXTREMITY;  Surgeon: Lorretta Harp, MD;  Location: [place] [facility];  Service: Cardiovascular;  Laterality: N/A;  ? ABDOMINAL AORTOGRAM W/LOWER EXTREMITY N/A 03/03/2020  ? Procedure: ABDOMINAL AORTOGRAM W/LOWER EXTREMITY;  Surgeon: Lorretta Harp, MD;  Location: [place] [facility];  Service: Cardiovascular;  Laterality: N/A;  ? ABDOMINAL AORTOGRAM W/LOWER EXTREMITY N/A 08/31/2021  ? Procedure: ABDOMINAL AORTOGRAM W/LOWER EXTREMITY;  Surgeon: Lorretta Harp, MD;  Location: [place] [facility];  Service: Cardiovascular;  Laterality: N/A;  ? AMPUTATION Left 09/02/2021  ? Procedure: AMPUTATION GREAT TOE;  Surgeon: Felipa Furnace, DPM;  Location: [place];  Service: Podiatry;  Laterality: Left;  ? APPENDECTOMY    ? BASCILIC VEIN TRANSPOSITION Right 10/03/2013  ? Procedure: BRACHIOCEPHALIC Arteriovenous Fistula ;  Surgeon: Mal Misty, MD;  Location: [place];  Service: Vascular;  Laterality: Right;  ? BASCILIC VEIN TRANSPOSITION  Right 11/17/2016  ? Procedure: BASCILIC VEIN TRANSPOSITION- RIGHT SINGLE STAGE;  Surgeon: Conrad Sycamore, MD;  Location: [place];  Service: Vascular;  Laterality: Right;  ? COLONOSCOPY    ? CYST EXCISION  1980's  ? cyst removed from lower abdomen  ? [place] OF UTERUS  1987  ? ESOPHAGOGASTRODUODENOSCOPY    ? PERIPHERAL VASCULAR BALLOON ANGIOPLASTY Right 01/30/2018  ? Procedure: PERIPHERAL VASCULAR BALLOON ANGIOPLASTY;  Surgeon: Lorretta Harp, MD;  Location: [place] [facility];  Service: Cardiovascular;  Laterality: Right;  superficial femoral  ? PERIPHERAL VASCULAR BALLOON ANGIOPLASTY Left 08/31/2021  ? Procedure: PERIPHERAL VASCULAR BALLOON ANGIOPLASTY;  Surgeon: Lorretta Harp, MD;  Location: [place] [facility];  Service: Cardiovascular;  Laterality: Left;  ? PERIPHERAL VASCULAR INTERVENTION  03/03/2020  ? Procedure: PERIPHERAL VASCULAR INTERVENTION;  Surgeon: Lorretta Harp, MD;  Location: [place] [facility];  Service: Cardiovascular;;  external iliac stent ?left sfa lithotripsy/ drug coated balloon  ? SHOULDER ARTHROSCOPY W/ ROTATOR CUFF REPAIR Right 2013  ? TRANSURETHRAL RESECTION OF BLADDER TUMOR N/A 07/22/2021  ? Procedure: TRANSURETHRAL RESECTION OF BLADDER TUMOR (TURBT);  Surgeon: Vira Agar, MD;  [facility];  Service: Urology;  Laterality: N/A;  ? TUBAL LIGATION  1986  ? UPPER EXTREMITY VENOGRAPHY Bilateral 11/08/2016  ? Procedure: Bilateral Upper Extremity Venography;  Surgeon: Conrad [place], MD;  Location: [place] [facility];  Service: Cardiovascular;  Laterality: Bilateral;  ? ? ?MEDICATIONS: ? acetaminophen (TYLENOL) 325 MG tablet  ? allopurinol (ZYLOPRIM) 100 MG tablet  ? aspirin EC 81 MG tablet  ? atorvastatin (LIPITOR) 80 MG tablet  ? Blood Glucose Monitoring Suppl (ACCU-CHEK AVIVA PLUS) w/Device KIT  ? calcium acetate (PHOSLO) 667 MG capsule  ? cinacalcet (SENSIPAR) 60 MG tablet  ? clopidogrel (PLAVIX) 75 MG tablet  ? gabapentin (NEURONTIN) 100 MG capsule  ? glucose  blood test strip  ? Lancets MISC  ? lidocaine-prilocaine (EMLA) cream  ? multivitamin (RENA-VIT) TABS tablet  ? oxybutynin (DITROPAN) 5 MG tablet  ? oxyCODONE (ROXICODONE) 5 MG immediate release tablet  ? ?No current facility-admin

## 2021-10-03 DIAGNOSIS — N2581 Secondary hyperparathyroidism of renal origin: Secondary | ICD-10-CM | POA: Diagnosis not present

## 2021-10-03 DIAGNOSIS — Z992 Dependence on renal dialysis: Secondary | ICD-10-CM | POA: Diagnosis not present

## 2021-10-03 DIAGNOSIS — N186 End stage renal disease: Secondary | ICD-10-CM | POA: Diagnosis not present

## 2021-10-06 DIAGNOSIS — Z992 Dependence on renal dialysis: Secondary | ICD-10-CM | POA: Diagnosis not present

## 2021-10-06 DIAGNOSIS — N2581 Secondary hyperparathyroidism of renal origin: Secondary | ICD-10-CM | POA: Diagnosis not present

## 2021-10-06 DIAGNOSIS — N186 End stage renal disease: Secondary | ICD-10-CM | POA: Diagnosis not present

## 2021-10-07 ENCOUNTER — Ambulatory Visit (INDEPENDENT_AMBULATORY_CARE_PROVIDER_SITE_OTHER): Payer: Medicare HMO | Admitting: Podiatry

## 2021-10-07 DIAGNOSIS — Z89419 Acquired absence of unspecified great toe: Secondary | ICD-10-CM | POA: Diagnosis not present

## 2021-10-07 DIAGNOSIS — T8131XA Disruption of external operation (surgical) wound, not elsewhere classified, initial encounter: Secondary | ICD-10-CM

## 2021-10-08 DIAGNOSIS — N186 End stage renal disease: Secondary | ICD-10-CM | POA: Diagnosis not present

## 2021-10-08 DIAGNOSIS — Z992 Dependence on renal dialysis: Secondary | ICD-10-CM | POA: Diagnosis not present

## 2021-10-08 DIAGNOSIS — N2581 Secondary hyperparathyroidism of renal origin: Secondary | ICD-10-CM | POA: Diagnosis not present

## 2021-10-10 DIAGNOSIS — N186 End stage renal disease: Secondary | ICD-10-CM | POA: Diagnosis not present

## 2021-10-10 DIAGNOSIS — N2581 Secondary hyperparathyroidism of renal origin: Secondary | ICD-10-CM | POA: Diagnosis not present

## 2021-10-10 DIAGNOSIS — Z992 Dependence on renal dialysis: Secondary | ICD-10-CM | POA: Diagnosis not present

## 2021-10-12 ENCOUNTER — Ambulatory Visit (HOSPITAL_COMMUNITY)
Admission: RE | Admit: 2021-10-12 | Discharge: 2021-10-12 | Disposition: A | Discharge status: Home / Self Care | Payer: Medicare HMO | Source: Ambulatory Visit | Attending: Cardiology | Admitting: Cardiology

## 2021-10-12 ENCOUNTER — Encounter (HOSPITAL_COMMUNITY): Payer: Medicare HMO

## 2021-10-12 ENCOUNTER — Inpatient Hospital Stay (HOSPITAL_COMMUNITY): Admission: RE | Admit: 2021-10-12 | Payer: Medicare HMO | Source: Ambulatory Visit

## 2021-10-12 DIAGNOSIS — I739 Peripheral vascular disease, unspecified: Secondary | ICD-10-CM | POA: Diagnosis not present

## 2021-10-13 DIAGNOSIS — Z992 Dependence on renal dialysis: Secondary | ICD-10-CM | POA: Diagnosis not present

## 2021-10-13 DIAGNOSIS — N2581 Secondary hyperparathyroidism of renal origin: Secondary | ICD-10-CM | POA: Diagnosis not present

## 2021-10-13 DIAGNOSIS — N186 End stage renal disease: Secondary | ICD-10-CM | POA: Diagnosis not present

## 2021-10-15 DIAGNOSIS — Z992 Dependence on renal dialysis: Secondary | ICD-10-CM | POA: Diagnosis not present

## 2021-10-15 DIAGNOSIS — N2581 Secondary hyperparathyroidism of renal origin: Secondary | ICD-10-CM | POA: Diagnosis not present

## 2021-10-15 DIAGNOSIS — N186 End stage renal disease: Secondary | ICD-10-CM | POA: Diagnosis not present

## 2021-10-15 NOTE — Progress Notes (Signed)
Subjective: ?Joanne Carlson is a 76 y.o. female patient seen today in office for left surgery site wound check.  Patient undergoes left hallux amputation by me.  She states she is doing okay.  Minimal pain.  Denies any other acute complaints. ? ?Patient Active Problem List  ? Diagnosis Date Noted  ? Diabetic foot infection ([place]) 10/02/2021  ? Cellulitis   ? Medication monitoring encounter   ? Osteomyelitis ([place]) 08/29/2021  ? PVD (peripheral vascular disease) ([place]) 08/29/2021  ? Bladder tumor 07/22/2021  ? Aortic stenosis 04/08/2021  ? Lumbar radiculopathy 10/15/2020  ? Hypercalcemia 04/25/2020  ? Claudication in peripheral vascular disease ([place]) 03/03/2020  ? Pain of left hand 12/24/2019  ? Allergy, unspecified, initial encounter 02/05/2019  ? Anaphylactic shock, unspecified, initial encounter 02/05/2019  ? Encounter for removal of sutures 07/15/2018  ? Iron deficiency anemia, unspecified 02/27/2018  ? Atherosclerosis of native arteries of the extremities with gangrene ([place]) 11/23/2017  ? Critical lower limb ischemia ([place]) 01/07/2017  ? Shortness of breath 12/04/2016  ? Unspecified protein-calorie malnutrition ([place]) 12/02/2016  ? Encounter for immunization 11/22/2016  ? Dependence on renal dialysis ([place]) 11/16/2016  ? Coagulation defect, unspecified ([place]) 10/19/2016  ? Dorsalgia, unspecified 10/12/2016  ? Gout, unspecified 10/12/2016  ? Hyperkalemia 10/12/2016  ? 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 stage renal disease ([place]) 10/12/2016  ? Morbid (severe) obesity due to excess calories ([place]) 10/12/2016  ? Secondary hyperparathyroidism of renal origin ([place]) 10/12/2016  ? Chronic renal insufficiency, stage IV (severe) ([place]) 11/13/2013  ? End stage renal disease ([place]) 07/20/2013  ? Mechanical complication of other vascular device, implant, and graft 07/20/2013  ? Other specified disorders of kidney and ureter 11/30/2011  ? History of recurrent transient ischemic attacks  09/27/2011  ? HYPERCHOLESTEROLEMIA 08/07/2008  ? ANEMIA 08/07/2008  ? RENAL INSUFFICIENCY 08/07/2008  ? ALLERGIC RHINITIS 03/04/2008  ? Type 2 diabetes mellitus without complication, with long-term current use of insulin ([place]) 03/15/2007  ? HYPERTENSION, BENIGN 03/15/2007  ? ? ?Current Outpatient Medications on File Prior to Visit  ?Medication Sig Dispense Refill  ? acetaminophen (TYLENOL) 325 MG tablet Take 2 tablets (650 mg total) by mouth every 4 (four) hours as needed for headache or mild pain. (Patient not taking: Reported on 09/25/2021)    ? allopurinol (ZYLOPRIM) 100 MG tablet Take 100 mg by mouth daily.    ? aspirin EC 81 MG tablet Take 81 mg by mouth 4 (four) times a week.    ? atorvastatin (LIPITOR) 80 MG tablet TAKE 1 TABLET BY MOUTH ONCE DAILY AT  6:00 PM 90 tablet 0  ? Blood Glucose Monitoring Suppl (ACCU-CHEK AVIVA PLUS) w/Device KIT   0  ? calcium acetate (PHOSLO) 667 MG capsule Take 667 mg by mouth 2 (two) times daily with a meal.    ? cinacalcet (SENSIPAR) 60 MG tablet Take 60 mg by mouth every Monday, Wednesday, and Friday.    ? clopidogrel (PLAVIX) 75 MG tablet Take 75 mg by mouth daily.    ? gabapentin (NEURONTIN) 100 MG capsule Take 1 capsule by mouth every morning and 1 capsule at bedtime. PATIENT NEEDS OFFICE VISIT FOR ADDITIONAL REFILLS (Patient taking differently: Take 100 mg by mouth 2 (two) times daily as needed (pain).) 60 capsule 0  ? glucose blood test strip Use to test blood sugar daily. Dx code: 250.02. 100 each 3  ? Lancets MISC Use to test blood sugar daily. Dx code 250.02. 100 each 3  ?  lidocaine-prilocaine (EMLA) cream Apply 1 application topically Every Tuesday,Thursday,and Saturday with dialysis.   12  ? multivitamin (RENA-VIT) TABS tablet Take 1 tablet by mouth daily.    ? oxybutynin (DITROPAN) 5 MG tablet Take 1 tablet (5 mg total) by mouth 3 (three) times daily as needed for bladder spasms. 20 tablet 1  ? oxyCODONE (ROXICODONE) 5 MG immediate release tablet Take 1 tablet (5 mg  total) by mouth every 8 (eight) hours as needed. (Patient not taking: Reported on 09/24/2021) 15 tablet 0  ? ?No current facility-administered medications on file prior to visit.  ? ? ?Allergies  ?Allergen Reactions  ? Blood-Group Specific Substance   ? Tylenol [Acetaminophen] Rash  ? ? ?Objective: ?There were no vitals filed for this visit. ? ?General: No acute distress, AAOx3  ?Left foot: Sutures intact with small superficial dehiscence measuring 0.5 cm x 0.3 cm x 0.1 cm noted.  No purulent drainage expressed.  Not too deep. ? ? ? ? ?Post Op Xray, left foot consistent with amputation status ? ?Assessment and Plan:  ?Problem List Items Addressed This Visit   ?None ?Visit Diagnoses   ? ? History of amputation of great toe ([place])    -  Primary  ? Superficial disruption or dehiscence of operation wound, initial encounter      ? ?  ? ? ? ? ? ?-Patient seen and evaluated ?-Blast X ointment sample was given to the patient and I will ask her to apply it once a day.  She states understanding. ?-Advised patient to continue with post-op shoe on left foot  ?-Advised patient to limit activity to necessity  ?-Advised patient to ice and elevate as directed ?-Continue with vascular follow up  ? ? ? ? ?

## 2021-10-17 DIAGNOSIS — N2581 Secondary hyperparathyroidism of renal origin: Secondary | ICD-10-CM | POA: Diagnosis not present

## 2021-10-17 DIAGNOSIS — Z992 Dependence on renal dialysis: Secondary | ICD-10-CM | POA: Diagnosis not present

## 2021-10-17 DIAGNOSIS — N186 End stage renal disease: Secondary | ICD-10-CM | POA: Diagnosis not present

## 2021-10-19 DIAGNOSIS — T82858A Stenosis of vascular prosthetic devices, implants and grafts, initial encounter: Secondary | ICD-10-CM | POA: Diagnosis not present

## 2021-10-19 DIAGNOSIS — N186 End stage renal disease: Secondary | ICD-10-CM | POA: Diagnosis not present

## 2021-10-19 DIAGNOSIS — Z992 Dependence on renal dialysis: Secondary | ICD-10-CM | POA: Diagnosis not present

## 2021-10-19 DIAGNOSIS — I871 Compression of vein: Secondary | ICD-10-CM | POA: Diagnosis not present

## 2021-10-20 DIAGNOSIS — C678 Malignant neoplasm of overlapping sites of bladder: Secondary | ICD-10-CM | POA: Diagnosis not present

## 2021-10-20 DIAGNOSIS — Z992 Dependence on renal dialysis: Secondary | ICD-10-CM | POA: Diagnosis not present

## 2021-10-20 DIAGNOSIS — N186 End stage renal disease: Secondary | ICD-10-CM | POA: Diagnosis not present

## 2021-10-20 DIAGNOSIS — N2581 Secondary hyperparathyroidism of renal origin: Secondary | ICD-10-CM | POA: Diagnosis not present

## 2021-10-21 ENCOUNTER — Encounter (HOSPITAL_COMMUNITY): Admission: RE | Payer: Self-pay | Source: Ambulatory Visit

## 2021-10-21 ENCOUNTER — Inpatient Hospital Stay (HOSPITAL_COMMUNITY): Admission: RE | Admit: 2021-10-21 | Payer: Medicare HMO | Source: Ambulatory Visit | Admitting: Urology

## 2021-10-21 SURGERY — CYSTECTOMY, ROBOT-ASSISTED, WITH ILEAL CONDUIT CREATION
Anesthesia: General

## 2021-10-22 DIAGNOSIS — Z992 Dependence on renal dialysis: Secondary | ICD-10-CM | POA: Diagnosis not present

## 2021-10-22 DIAGNOSIS — N2581 Secondary hyperparathyroidism of renal origin: Secondary | ICD-10-CM | POA: Diagnosis not present

## 2021-10-22 DIAGNOSIS — N186 End stage renal disease: Secondary | ICD-10-CM | POA: Diagnosis not present

## 2021-10-24 DIAGNOSIS — Z992 Dependence on renal dialysis: Secondary | ICD-10-CM | POA: Diagnosis not present

## 2021-10-24 DIAGNOSIS — N186 End stage renal disease: Secondary | ICD-10-CM | POA: Diagnosis not present

## 2021-10-24 DIAGNOSIS — N2581 Secondary hyperparathyroidism of renal origin: Secondary | ICD-10-CM | POA: Diagnosis not present

## 2021-10-27 DIAGNOSIS — N186 End stage renal disease: Secondary | ICD-10-CM | POA: Diagnosis not present

## 2021-10-27 DIAGNOSIS — N2581 Secondary hyperparathyroidism of renal origin: Secondary | ICD-10-CM | POA: Diagnosis not present

## 2021-10-27 DIAGNOSIS — Z992 Dependence on renal dialysis: Secondary | ICD-10-CM | POA: Diagnosis not present

## 2021-10-28 ENCOUNTER — Ambulatory Visit: Payer: Medicare HMO | Admitting: Podiatry

## 2021-10-29 DIAGNOSIS — N2581 Secondary hyperparathyroidism of renal origin: Secondary | ICD-10-CM | POA: Diagnosis not present

## 2021-10-29 DIAGNOSIS — N186 End stage renal disease: Secondary | ICD-10-CM | POA: Diagnosis not present

## 2021-10-29 DIAGNOSIS — Z992 Dependence on renal dialysis: Secondary | ICD-10-CM | POA: Diagnosis not present

## 2021-10-31 DIAGNOSIS — N2581 Secondary hyperparathyroidism of renal origin: Secondary | ICD-10-CM | POA: Diagnosis not present

## 2021-10-31 DIAGNOSIS — N186 End stage renal disease: Secondary | ICD-10-CM | POA: Diagnosis not present

## 2021-10-31 DIAGNOSIS — Z992 Dependence on renal dialysis: Secondary | ICD-10-CM | POA: Diagnosis not present

## 2021-11-01 DIAGNOSIS — E1122 Type 2 diabetes mellitus with diabetic chronic kidney disease: Secondary | ICD-10-CM | POA: Diagnosis not present

## 2021-11-01 DIAGNOSIS — N186 End stage renal disease: Secondary | ICD-10-CM | POA: Diagnosis not present

## 2021-11-01 DIAGNOSIS — Z992 Dependence on renal dialysis: Secondary | ICD-10-CM | POA: Diagnosis not present

## 2021-11-03 DIAGNOSIS — N2581 Secondary hyperparathyroidism of renal origin: Secondary | ICD-10-CM | POA: Diagnosis not present

## 2021-11-03 DIAGNOSIS — Z992 Dependence on renal dialysis: Secondary | ICD-10-CM | POA: Diagnosis not present

## 2021-11-03 DIAGNOSIS — N186 End stage renal disease: Secondary | ICD-10-CM | POA: Diagnosis not present

## 2021-11-05 DIAGNOSIS — N2581 Secondary hyperparathyroidism of renal origin: Secondary | ICD-10-CM | POA: Diagnosis not present

## 2021-11-05 DIAGNOSIS — N186 End stage renal disease: Secondary | ICD-10-CM | POA: Diagnosis not present

## 2021-11-05 DIAGNOSIS — Z992 Dependence on renal dialysis: Secondary | ICD-10-CM | POA: Diagnosis not present

## 2021-11-07 DIAGNOSIS — Z992 Dependence on renal dialysis: Secondary | ICD-10-CM | POA: Diagnosis not present

## 2021-11-07 DIAGNOSIS — N2581 Secondary hyperparathyroidism of renal origin: Secondary | ICD-10-CM | POA: Diagnosis not present

## 2021-11-07 DIAGNOSIS — N186 End stage renal disease: Secondary | ICD-10-CM | POA: Diagnosis not present

## 2021-11-10 DIAGNOSIS — N2581 Secondary hyperparathyroidism of renal origin: Secondary | ICD-10-CM | POA: Diagnosis not present

## 2021-11-10 DIAGNOSIS — Z992 Dependence on renal dialysis: Secondary | ICD-10-CM | POA: Diagnosis not present

## 2021-11-10 DIAGNOSIS — N186 End stage renal disease: Secondary | ICD-10-CM | POA: Diagnosis not present

## 2021-11-11 ENCOUNTER — Ambulatory Visit (INDEPENDENT_AMBULATORY_CARE_PROVIDER_SITE_OTHER): Payer: Medicare HMO | Admitting: Podiatry

## 2021-11-11 DIAGNOSIS — Z89419 Acquired absence of unspecified great toe: Secondary | ICD-10-CM | POA: Diagnosis not present

## 2021-11-11 NOTE — Progress Notes (Signed)
?Subjective:  ?Patient ID: Joanne Carlson, female    DOB: February 04, 1946,  MRN: [id number] ? ?Chief Complaint  ?Patient presents with  ? Wound Check  ? ? ?DOS: 09/02/2021 ?Procedure: Left hallux amputation ? ?76 y.o. female returns for post-op check.  Patient states she is doing okay.  She is feeling a lot better minimal pain.  No bandages ambulating with surgical shoe ? ?Review of Systems: Negative except as noted in the HPI. Denies N/V/F/Ch. ? ?Past Medical History:  ?Diagnosis Date  ? Arthritis   ? "legs, knees; not bad" (01/30/2018)  ? Diabetic foot infection ([place]) 10/02/2021  ? Dizziness   ? occasionally  ? ESRD (end stage renal disease) ([place])   ? [name]; TTS" (01/30/2018)  ? History of colon polyps   ? History of gout   ? takes Allopurinol daily (01/30/2018)  ? Hyperlipidemia   ? takes Pravastatin daily  ? Hypertension   ? takes Monopril daily  ? Joint pain   ? PAD (peripheral artery disease) ([place])   ? Peripheral neuropathy   ? Peripheral vascular disease ([place])   ? Pneumonia   ? Pre-diabetes   ? Refusal of blood transfusions as patient is Jehovah's Witness   ? NO BLOOD OR BLOOD PRODUCTS. Albumin okay.   ? Stroke [facility]) 1990s  ? "mini stroke; left lower mouth a little bit twisted since" (01/30/2018)  ? ? ?Current Outpatient Medications:  ?  acetaminophen (TYLENOL) 325 MG tablet, Take 2 tablets (650 mg total) by mouth every 4 (four) hours as needed for headache or mild pain. (Patient not taking: Reported on 09/25/2021), Disp: , Rfl:  ?  allopurinol (ZYLOPRIM) 100 MG tablet, Take 100 mg by mouth daily., Disp: , Rfl:  ?  aspirin EC 81 MG tablet, Take 81 mg by mouth 4 (four) times a week., Disp: , Rfl:  ?  atorvastatin (LIPITOR) 80 MG tablet, TAKE 1 TABLET BY MOUTH ONCE DAILY AT  6:00 PM, Disp: 90 tablet, Rfl: 0 ?  Blood Glucose Monitoring Suppl (ACCU-CHEK AVIVA PLUS) w/Device KIT, , Disp: , Rfl: 0 ?  calcium acetate (PHOSLO) 667 MG capsule, Take 667 mg by mouth 2 (two) times daily with a meal., Disp: , Rfl:  ?   cinacalcet (SENSIPAR) 60 MG tablet, Take 60 mg by mouth every Monday, Wednesday, and Friday., Disp: , Rfl:  ?  clopidogrel (PLAVIX) 75 MG tablet, Take 75 mg by mouth daily., Disp: , Rfl:  ?  gabapentin (NEURONTIN) 100 MG capsule, Take 1 capsule by mouth every morning and 1 capsule at bedtime. PATIENT NEEDS OFFICE VISIT FOR ADDITIONAL REFILLS (Patient taking differently: Take 100 mg by mouth 2 (two) times daily as needed (pain).), Disp: 60 capsule, Rfl: 0 ?  glucose blood test strip, Use to test blood sugar daily. Dx code: 67.02., Disp: 100 each, Rfl: 3 ?  Lancets MISC, Use to test blood sugar daily. Dx code 38.02., Disp: 100 each, Rfl: 3 ?  lidocaine-prilocaine (EMLA) cream, Apply 1 application topically Every Tuesday,Thursday,and Saturday with dialysis. , Disp: , Rfl: 12 ?  multivitamin (RENA-VIT) TABS tablet, Take 1 tablet by mouth daily., Disp: , Rfl:  ?  oxybutynin (DITROPAN) 5 MG tablet, Take 1 tablet (5 mg total) by mouth 3 (three) times daily as needed for bladder spasms., Disp: 20 tablet, Rfl: 1 ?  oxyCODONE (ROXICODONE) 5 MG immediate release tablet, Take 1 tablet (5 mg total) by mouth every 8 (eight) hours as needed. (Patient not taking: Reported on 09/24/2021), Disp: 15 tablet,  Rfl: 0 ? ?Social History  ? ?Tobacco Use  ?Smoking Status Former  ? Packs/day: 0.25  ? Years: 10.00  ? Pack years: 2.50  ? Types: Cigarettes  ? Quit date: 1970  ? Years since quitting: 53.3  ?Smokeless Tobacco Never  ?Tobacco Comments  ? "never a heavy smoker; smoked off and on"  ? ? ?Allergies  ?Allergen Reactions  ? Blood-Group Specific Substance   ? Tylenol [Acetaminophen] Rash  ? ?Objective:  ?There were no vitals filed for this visit. ?There is no height or weight on file to calculate BMI. ?Constitutional Well developed. ?Well nourished.  ?Vascular Foot warm and well perfused. ?Capillary refill normal to all digits.   ?Neurologic Normal speech. ?Oriented to person, place, and time. ?Epicritic sensation to light touch grossly  present bilaterally.  ?Dermatologic Skin completely epithelialized.  No clinical signs of Deis is noted.  No complication noted.  ?Orthopedic: Tenderness to palpation noted about the surgical site.  ? ?Radiographs: None ?Assessment:  ?No diagnosis found. ?Plan:  ?Patient was evaluated and treated and all questions answered. ? ?S/p foot surgery left ?-Progressing as expected post-operatively. ?-XR: See above ?-WB Status: Weightbearing as tolerated in regular shoes ?-Sutures: None. ?-Medications: None ?Patient is officially discharged from my care if any foot and ankle issues arise have asked her to come back and see me.  She states understanding.  I discussed shoe gear modification with her as well. ? ?No follow-ups on file.  ?

## 2021-11-12 DIAGNOSIS — Z992 Dependence on renal dialysis: Secondary | ICD-10-CM | POA: Diagnosis not present

## 2021-11-12 DIAGNOSIS — N2581 Secondary hyperparathyroidism of renal origin: Secondary | ICD-10-CM | POA: Diagnosis not present

## 2021-11-12 DIAGNOSIS — N186 End stage renal disease: Secondary | ICD-10-CM | POA: Diagnosis not present

## 2021-11-14 DIAGNOSIS — Z992 Dependence on renal dialysis: Secondary | ICD-10-CM | POA: Diagnosis not present

## 2021-11-14 DIAGNOSIS — N2581 Secondary hyperparathyroidism of renal origin: Secondary | ICD-10-CM | POA: Diagnosis not present

## 2021-11-14 DIAGNOSIS — N186 End stage renal disease: Secondary | ICD-10-CM | POA: Diagnosis not present

## 2021-11-17 DIAGNOSIS — Z992 Dependence on renal dialysis: Secondary | ICD-10-CM | POA: Diagnosis not present

## 2021-11-17 DIAGNOSIS — N2581 Secondary hyperparathyroidism of renal origin: Secondary | ICD-10-CM | POA: Diagnosis not present

## 2021-11-17 DIAGNOSIS — N186 End stage renal disease: Secondary | ICD-10-CM | POA: Diagnosis not present

## 2021-11-19 DIAGNOSIS — Z992 Dependence on renal dialysis: Secondary | ICD-10-CM | POA: Diagnosis not present

## 2021-11-19 DIAGNOSIS — N186 End stage renal disease: Secondary | ICD-10-CM | POA: Diagnosis not present

## 2021-11-19 DIAGNOSIS — N2581 Secondary hyperparathyroidism of renal origin: Secondary | ICD-10-CM | POA: Diagnosis not present

## 2021-11-21 DIAGNOSIS — N186 End stage renal disease: Secondary | ICD-10-CM | POA: Diagnosis not present

## 2021-11-21 DIAGNOSIS — N2581 Secondary hyperparathyroidism of renal origin: Secondary | ICD-10-CM | POA: Diagnosis not present

## 2021-11-21 DIAGNOSIS — Z992 Dependence on renal dialysis: Secondary | ICD-10-CM | POA: Diagnosis not present

## 2021-11-24 DIAGNOSIS — Z992 Dependence on renal dialysis: Secondary | ICD-10-CM | POA: Diagnosis not present

## 2021-11-24 DIAGNOSIS — N2581 Secondary hyperparathyroidism of renal origin: Secondary | ICD-10-CM | POA: Diagnosis not present

## 2021-11-24 DIAGNOSIS — N186 End stage renal disease: Secondary | ICD-10-CM | POA: Diagnosis not present

## 2021-11-26 DIAGNOSIS — Z992 Dependence on renal dialysis: Secondary | ICD-10-CM | POA: Diagnosis not present

## 2021-11-26 DIAGNOSIS — N2581 Secondary hyperparathyroidism of renal origin: Secondary | ICD-10-CM | POA: Diagnosis not present

## 2021-11-26 DIAGNOSIS — N186 End stage renal disease: Secondary | ICD-10-CM | POA: Diagnosis not present

## 2021-11-27 ENCOUNTER — Other Ambulatory Visit: Payer: Self-pay | Admitting: Cardiovascular Disease

## 2021-11-28 DIAGNOSIS — N186 End stage renal disease: Secondary | ICD-10-CM | POA: Diagnosis not present

## 2021-11-28 DIAGNOSIS — N2581 Secondary hyperparathyroidism of renal origin: Secondary | ICD-10-CM | POA: Diagnosis not present

## 2021-11-28 DIAGNOSIS — Z992 Dependence on renal dialysis: Secondary | ICD-10-CM | POA: Diagnosis not present

## 2021-12-01 DIAGNOSIS — Z992 Dependence on renal dialysis: Secondary | ICD-10-CM | POA: Diagnosis not present

## 2021-12-01 DIAGNOSIS — N186 End stage renal disease: Secondary | ICD-10-CM | POA: Diagnosis not present

## 2021-12-01 DIAGNOSIS — N2581 Secondary hyperparathyroidism of renal origin: Secondary | ICD-10-CM | POA: Diagnosis not present

## 2021-12-02 ENCOUNTER — Ambulatory Visit: Payer: Medicare HMO | Admitting: Podiatry

## 2021-12-02 DIAGNOSIS — N186 End stage renal disease: Secondary | ICD-10-CM | POA: Diagnosis not present

## 2021-12-02 DIAGNOSIS — Z992 Dependence on renal dialysis: Secondary | ICD-10-CM | POA: Diagnosis not present

## 2021-12-02 DIAGNOSIS — E1122 Type 2 diabetes mellitus with diabetic chronic kidney disease: Secondary | ICD-10-CM | POA: Diagnosis not present

## 2021-12-03 DIAGNOSIS — N2581 Secondary hyperparathyroidism of renal origin: Secondary | ICD-10-CM | POA: Diagnosis not present

## 2021-12-03 DIAGNOSIS — N186 End stage renal disease: Secondary | ICD-10-CM | POA: Diagnosis not present

## 2021-12-03 DIAGNOSIS — Z992 Dependence on renal dialysis: Secondary | ICD-10-CM | POA: Diagnosis not present

## 2021-12-05 DIAGNOSIS — N186 End stage renal disease: Secondary | ICD-10-CM | POA: Diagnosis not present

## 2021-12-05 DIAGNOSIS — N2581 Secondary hyperparathyroidism of renal origin: Secondary | ICD-10-CM | POA: Diagnosis not present

## 2021-12-05 DIAGNOSIS — Z992 Dependence on renal dialysis: Secondary | ICD-10-CM | POA: Diagnosis not present

## 2021-12-07 DIAGNOSIS — E1140 Type 2 diabetes mellitus with diabetic neuropathy, unspecified: Secondary | ICD-10-CM | POA: Diagnosis not present

## 2021-12-07 DIAGNOSIS — C679 Malignant neoplasm of bladder, unspecified: Secondary | ICD-10-CM | POA: Diagnosis not present

## 2021-12-07 DIAGNOSIS — S98112A Complete traumatic amputation of left great toe, initial encounter: Secondary | ICD-10-CM | POA: Diagnosis not present

## 2021-12-07 DIAGNOSIS — I350 Nonrheumatic aortic (valve) stenosis: Secondary | ICD-10-CM | POA: Diagnosis not present

## 2021-12-07 DIAGNOSIS — Z95828 Presence of other vascular implants and grafts: Secondary | ICD-10-CM | POA: Diagnosis not present

## 2021-12-07 DIAGNOSIS — I739 Peripheral vascular disease, unspecified: Secondary | ICD-10-CM | POA: Diagnosis not present

## 2021-12-07 DIAGNOSIS — N186 End stage renal disease: Secondary | ICD-10-CM | POA: Diagnosis not present

## 2021-12-07 DIAGNOSIS — Z905 Acquired absence of kidney: Secondary | ICD-10-CM | POA: Diagnosis not present

## 2021-12-07 DIAGNOSIS — Z992 Dependence on renal dialysis: Secondary | ICD-10-CM | POA: Diagnosis not present

## 2021-12-08 DIAGNOSIS — N2581 Secondary hyperparathyroidism of renal origin: Secondary | ICD-10-CM | POA: Diagnosis not present

## 2021-12-08 DIAGNOSIS — N186 End stage renal disease: Secondary | ICD-10-CM | POA: Diagnosis not present

## 2021-12-08 DIAGNOSIS — Z992 Dependence on renal dialysis: Secondary | ICD-10-CM | POA: Diagnosis not present

## 2021-12-10 DIAGNOSIS — Z992 Dependence on renal dialysis: Secondary | ICD-10-CM | POA: Diagnosis not present

## 2021-12-10 DIAGNOSIS — N2581 Secondary hyperparathyroidism of renal origin: Secondary | ICD-10-CM | POA: Diagnosis not present

## 2021-12-10 DIAGNOSIS — N186 End stage renal disease: Secondary | ICD-10-CM | POA: Diagnosis not present

## 2021-12-12 DIAGNOSIS — N2581 Secondary hyperparathyroidism of renal origin: Secondary | ICD-10-CM | POA: Diagnosis not present

## 2021-12-12 DIAGNOSIS — Z992 Dependence on renal dialysis: Secondary | ICD-10-CM | POA: Diagnosis not present

## 2021-12-12 DIAGNOSIS — N186 End stage renal disease: Secondary | ICD-10-CM | POA: Diagnosis not present

## 2021-12-15 DIAGNOSIS — N186 End stage renal disease: Secondary | ICD-10-CM | POA: Diagnosis not present

## 2021-12-15 DIAGNOSIS — N2581 Secondary hyperparathyroidism of renal origin: Secondary | ICD-10-CM | POA: Diagnosis not present

## 2021-12-15 DIAGNOSIS — Z992 Dependence on renal dialysis: Secondary | ICD-10-CM | POA: Diagnosis not present

## 2021-12-17 DIAGNOSIS — N186 End stage renal disease: Secondary | ICD-10-CM | POA: Diagnosis not present

## 2021-12-17 DIAGNOSIS — N2581 Secondary hyperparathyroidism of renal origin: Secondary | ICD-10-CM | POA: Diagnosis not present

## 2021-12-17 DIAGNOSIS — Z992 Dependence on renal dialysis: Secondary | ICD-10-CM | POA: Diagnosis not present

## 2021-12-19 DIAGNOSIS — N2581 Secondary hyperparathyroidism of renal origin: Secondary | ICD-10-CM | POA: Diagnosis not present

## 2021-12-19 DIAGNOSIS — Z992 Dependence on renal dialysis: Secondary | ICD-10-CM | POA: Diagnosis not present

## 2021-12-19 DIAGNOSIS — N186 End stage renal disease: Secondary | ICD-10-CM | POA: Diagnosis not present

## 2021-12-22 DIAGNOSIS — N2581 Secondary hyperparathyroidism of renal origin: Secondary | ICD-10-CM | POA: Diagnosis not present

## 2021-12-22 DIAGNOSIS — Z992 Dependence on renal dialysis: Secondary | ICD-10-CM | POA: Diagnosis not present

## 2021-12-22 DIAGNOSIS — N186 End stage renal disease: Secondary | ICD-10-CM | POA: Diagnosis not present

## 2021-12-24 DIAGNOSIS — Z992 Dependence on renal dialysis: Secondary | ICD-10-CM | POA: Diagnosis not present

## 2021-12-24 DIAGNOSIS — N186 End stage renal disease: Secondary | ICD-10-CM | POA: Diagnosis not present

## 2021-12-24 DIAGNOSIS — N2581 Secondary hyperparathyroidism of renal origin: Secondary | ICD-10-CM | POA: Diagnosis not present

## 2021-12-25 ENCOUNTER — Other Ambulatory Visit: Payer: Self-pay | Admitting: Cardiovascular Disease

## 2021-12-26 DIAGNOSIS — N186 End stage renal disease: Secondary | ICD-10-CM | POA: Diagnosis not present

## 2021-12-26 DIAGNOSIS — N2581 Secondary hyperparathyroidism of renal origin: Secondary | ICD-10-CM | POA: Diagnosis not present

## 2021-12-26 DIAGNOSIS — Z992 Dependence on renal dialysis: Secondary | ICD-10-CM | POA: Diagnosis not present

## 2021-12-29 DIAGNOSIS — Z992 Dependence on renal dialysis: Secondary | ICD-10-CM | POA: Diagnosis not present

## 2021-12-29 DIAGNOSIS — N2581 Secondary hyperparathyroidism of renal origin: Secondary | ICD-10-CM | POA: Diagnosis not present

## 2021-12-29 DIAGNOSIS — N186 End stage renal disease: Secondary | ICD-10-CM | POA: Diagnosis not present

## 2021-12-31 DIAGNOSIS — Z992 Dependence on renal dialysis: Secondary | ICD-10-CM | POA: Diagnosis not present

## 2021-12-31 DIAGNOSIS — N2581 Secondary hyperparathyroidism of renal origin: Secondary | ICD-10-CM | POA: Diagnosis not present

## 2021-12-31 DIAGNOSIS — N186 End stage renal disease: Secondary | ICD-10-CM | POA: Diagnosis not present

## 2022-01-01 DIAGNOSIS — Z992 Dependence on renal dialysis: Secondary | ICD-10-CM | POA: Diagnosis not present

## 2022-01-01 DIAGNOSIS — N186 End stage renal disease: Secondary | ICD-10-CM | POA: Diagnosis not present

## 2022-01-01 DIAGNOSIS — E1122 Type 2 diabetes mellitus with diabetic chronic kidney disease: Secondary | ICD-10-CM | POA: Diagnosis not present

## 2022-01-02 DIAGNOSIS — N2581 Secondary hyperparathyroidism of renal origin: Secondary | ICD-10-CM | POA: Diagnosis not present

## 2022-01-02 DIAGNOSIS — Z992 Dependence on renal dialysis: Secondary | ICD-10-CM | POA: Diagnosis not present

## 2022-01-02 DIAGNOSIS — N186 End stage renal disease: Secondary | ICD-10-CM | POA: Diagnosis not present

## 2022-01-05 DIAGNOSIS — N186 End stage renal disease: Secondary | ICD-10-CM | POA: Diagnosis not present

## 2022-01-05 DIAGNOSIS — Z992 Dependence on renal dialysis: Secondary | ICD-10-CM | POA: Diagnosis not present

## 2022-01-05 DIAGNOSIS — N2581 Secondary hyperparathyroidism of renal origin: Secondary | ICD-10-CM | POA: Diagnosis not present

## 2022-01-07 DIAGNOSIS — N2581 Secondary hyperparathyroidism of renal origin: Secondary | ICD-10-CM | POA: Diagnosis not present

## 2022-01-07 DIAGNOSIS — N186 End stage renal disease: Secondary | ICD-10-CM | POA: Diagnosis not present

## 2022-01-07 DIAGNOSIS — Z992 Dependence on renal dialysis: Secondary | ICD-10-CM | POA: Diagnosis not present

## 2022-01-09 DIAGNOSIS — N186 End stage renal disease: Secondary | ICD-10-CM | POA: Diagnosis not present

## 2022-01-09 DIAGNOSIS — N2581 Secondary hyperparathyroidism of renal origin: Secondary | ICD-10-CM | POA: Diagnosis not present

## 2022-01-09 DIAGNOSIS — Z992 Dependence on renal dialysis: Secondary | ICD-10-CM | POA: Diagnosis not present

## 2022-01-12 DIAGNOSIS — N186 End stage renal disease: Secondary | ICD-10-CM | POA: Diagnosis not present

## 2022-01-12 DIAGNOSIS — N2581 Secondary hyperparathyroidism of renal origin: Secondary | ICD-10-CM | POA: Diagnosis not present

## 2022-01-12 DIAGNOSIS — Z992 Dependence on renal dialysis: Secondary | ICD-10-CM | POA: Diagnosis not present

## 2022-01-13 DIAGNOSIS — C678 Malignant neoplasm of overlapping sites of bladder: Secondary | ICD-10-CM | POA: Diagnosis not present

## 2022-01-13 DIAGNOSIS — N134 Hydroureter: Secondary | ICD-10-CM | POA: Diagnosis not present

## 2022-01-13 DIAGNOSIS — N1330 Unspecified hydronephrosis: Secondary | ICD-10-CM | POA: Diagnosis not present

## 2022-01-13 DIAGNOSIS — C679 Malignant neoplasm of bladder, unspecified: Secondary | ICD-10-CM | POA: Diagnosis not present

## 2022-01-14 DIAGNOSIS — N2581 Secondary hyperparathyroidism of renal origin: Secondary | ICD-10-CM | POA: Diagnosis not present

## 2022-01-14 DIAGNOSIS — N186 End stage renal disease: Secondary | ICD-10-CM | POA: Diagnosis not present

## 2022-01-14 DIAGNOSIS — Z992 Dependence on renal dialysis: Secondary | ICD-10-CM | POA: Diagnosis not present

## 2022-01-16 DIAGNOSIS — Z992 Dependence on renal dialysis: Secondary | ICD-10-CM | POA: Diagnosis not present

## 2022-01-16 DIAGNOSIS — N2581 Secondary hyperparathyroidism of renal origin: Secondary | ICD-10-CM | POA: Diagnosis not present

## 2022-01-16 DIAGNOSIS — N186 End stage renal disease: Secondary | ICD-10-CM | POA: Diagnosis not present

## 2022-01-18 DIAGNOSIS — C7802 Secondary malignant neoplasm of left lung: Secondary | ICD-10-CM | POA: Diagnosis not present

## 2022-01-18 DIAGNOSIS — C7801 Secondary malignant neoplasm of right lung: Secondary | ICD-10-CM | POA: Diagnosis not present

## 2022-01-18 DIAGNOSIS — C678 Malignant neoplasm of overlapping sites of bladder: Secondary | ICD-10-CM | POA: Diagnosis not present

## 2022-01-18 DIAGNOSIS — C775 Secondary and unspecified malignant neoplasm of intrapelvic lymph nodes: Secondary | ICD-10-CM | POA: Diagnosis not present

## 2022-01-19 DIAGNOSIS — Z992 Dependence on renal dialysis: Secondary | ICD-10-CM | POA: Diagnosis not present

## 2022-01-19 DIAGNOSIS — N186 End stage renal disease: Secondary | ICD-10-CM | POA: Diagnosis not present

## 2022-01-19 DIAGNOSIS — N2581 Secondary hyperparathyroidism of renal origin: Secondary | ICD-10-CM | POA: Diagnosis not present

## 2022-01-21 DIAGNOSIS — N2581 Secondary hyperparathyroidism of renal origin: Secondary | ICD-10-CM | POA: Diagnosis not present

## 2022-01-21 DIAGNOSIS — N186 End stage renal disease: Secondary | ICD-10-CM | POA: Diagnosis not present

## 2022-01-21 DIAGNOSIS — Z992 Dependence on renal dialysis: Secondary | ICD-10-CM | POA: Diagnosis not present

## 2022-01-23 DIAGNOSIS — N186 End stage renal disease: Secondary | ICD-10-CM | POA: Diagnosis not present

## 2022-01-23 DIAGNOSIS — Z992 Dependence on renal dialysis: Secondary | ICD-10-CM | POA: Diagnosis not present

## 2022-01-23 DIAGNOSIS — N2581 Secondary hyperparathyroidism of renal origin: Secondary | ICD-10-CM | POA: Diagnosis not present

## 2022-01-25 ENCOUNTER — Ambulatory Visit: Payer: Medicare HMO

## 2022-01-25 NOTE — Patient Instructions (Addendum)
Visit Information  Thank you for allowing the care management team to participate in your care. It was great speaking with you today! Please don't hesitate to contact me if you require assistance prior to our next outreach.  Following are the goals we discussed today:   Goals Addressed             This Visit's Progress    Follow up with Dr. to discuss left foot pain       Care Coordination Interventions: Discussed concerns related to left foot pain. Reports recent left great toe amputation. Denies increased swelling, discoloration, or drainage from the site. Reports constant "throbbing" and discomfort to the site. Declined need for acute evaluation. Reports she will be seen in the Podiatry clinic this week. Reviewed safety and fall prevention measures. Reports following recommended activity restrictions and using her walker as advised. Reports no longer requiring use of the surgical shoe.  Discussed care management needs. Reports completing ADLs independently. Declined current need for additional in-home assistance. Declined current need for transportation assistance. Agreed to call if needed prior to next scheduled outreach.           Our next appointment is by telephone on February 22, 2022 at 3:00 pm Please call the care guide team at [phone] if you need to cancel or reschedule your appointment.   The patient verbalized understanding of information discussed during the telephonic outreach today. Declined need for mailed instructions or resources.   A member of the care management team will follow up next month.  [facility] [phone]

## 2022-01-25 NOTE — Patient Outreach (Addendum)
  Care Coordination   Initial Visit Note   01/25/2022 Name: Joanne Carlson MRN: [id number] DOB: [birth year]  Joanne Carlson is a 76 y.o. year old female who sees Jacelyn Grip, Edwyna Shell, MD (Inactive) for primary care. I spoke with  Rickard Patience by phone today  What matters to the patients health and wellness today?  Left Foot Pain  Goals Addressed             This Visit's Progress    Follow up with Dr. to discuss left foot pain       Care Coordination Interventions: Discussed concerns related to left foot pain. Reports recent left great toe amputation. Denies increased swelling, discoloration, or drainage from the site. Reports constant "throbbing" and discomfort to the site. Declined need for acute evaluation. Reports she will be seen in the Podiatry clinic this week. Reviewed safety and fall prevention measures. Reports following recommended activity restrictions and using her walker as advised. Reports no longer requiring use of the surgical shoe.  Discussed care management needs. Reports completing ADLs independently. Declined current need for additional in-home assistance. Declined current need for transportation assistance. Agreed to call if needed prior to next scheduled outreach.            SDOH assessments and interventions completed:   Yes SDOH Interventions Today    Flowsheet Row Most Recent Value  SDOH Interventions   Food Insecurity Interventions Intervention Not Indicated  Transportation Interventions Intervention Not Indicated       Care Coordination Interventions Activated:  Yes Care Coordination Interventions:  Yes, provided  Follow up plan: Follow up call scheduled for February 22, 2022 at 3pm.  Encounter Outcome:  Pt. Visit Completed   [facility] [phone]

## 2022-01-26 DIAGNOSIS — N186 End stage renal disease: Secondary | ICD-10-CM | POA: Diagnosis not present

## 2022-01-26 DIAGNOSIS — N2581 Secondary hyperparathyroidism of renal origin: Secondary | ICD-10-CM | POA: Diagnosis not present

## 2022-01-26 DIAGNOSIS — Z992 Dependence on renal dialysis: Secondary | ICD-10-CM | POA: Diagnosis not present

## 2022-01-27 ENCOUNTER — Ambulatory Visit: Payer: Medicare HMO | Admitting: Podiatry

## 2022-01-27 ENCOUNTER — Ambulatory Visit (INDEPENDENT_AMBULATORY_CARE_PROVIDER_SITE_OTHER): Payer: Medicare HMO | Admitting: Podiatry

## 2022-01-27 ENCOUNTER — Encounter: Payer: Self-pay | Admitting: Podiatry

## 2022-01-27 DIAGNOSIS — B351 Tinea unguium: Secondary | ICD-10-CM

## 2022-01-27 DIAGNOSIS — M79675 Pain in left toe(s): Secondary | ICD-10-CM

## 2022-01-27 DIAGNOSIS — M79674 Pain in right toe(s): Secondary | ICD-10-CM

## 2022-01-27 NOTE — Progress Notes (Signed)
  Subjective:  Patient ID: Joanne Carlson, female    DOB: 06/19/1946,  MRN: [id number]  Chief Complaint  Patient presents with   Nail Problem   76 y.o. female returns for the above complaint.  Patient presents with thickened elongated dystrophic toenails x10 mild pain on palpation.  Patient would like for me to debride down she is not able to do it herself she denies any other acute complaints.  Objective:  There were no vitals filed for this visit. Podiatric Exam: Vascular: dorsalis pedis and posterior tibial pulses are palpable bilateral. Capillary return is immediate. Temperature gradient is WNL. Skin turgor WNL  Sensorium: Normal Semmes Weinstein monofilament test. Normal tactile sensation bilaterally. Nail Exam: Pt has thick disfigured discolored nails with subungual debris noted bilateral entire nail hallux through fifth toenails.  Pain on palpation to the nails. Ulcer Exam: There is no evidence of ulcer or pre-ulcerative changes or infection. Orthopedic Exam: Muscle tone and strength are WNL. No limitations in general ROM. No crepitus or effusions noted.  Skin: No Porokeratosis. No infection or ulcers    Assessment & Plan:   1. Pain due to onychomycosis of toenails of both feet     Patient was evaluated and treated and all questions answered.  Onychomycosis with pain  -Nails palliatively debrided as below. -Educated on self-care  Procedure: Nail Debridement Rationale: pain  Type of Debridement: manual, sharp debridement. Instrumentation: Nail nipper, rotary burr. Number of Nails: 10  Procedures and Treatment: Consent by patient was obtained for treatment procedures. The patient understood the discussion of treatment and procedures well. All questions were answered thoroughly reviewed. Debridement of mycotic and hypertrophic toenails, 1 through 5 bilateral and clearing of subungual debris. No ulceration, no infection noted.  Return Visit-Office Procedure: Patient  instructed to return to the office for a follow up visit 3 months for continued evaluation and treatment.  Boneta Lucks, DPM    Return in about 3 months (around 04/29/2022).

## 2022-01-28 DIAGNOSIS — N186 End stage renal disease: Secondary | ICD-10-CM | POA: Diagnosis not present

## 2022-01-28 DIAGNOSIS — N2581 Secondary hyperparathyroidism of renal origin: Secondary | ICD-10-CM | POA: Diagnosis not present

## 2022-01-28 DIAGNOSIS — Z992 Dependence on renal dialysis: Secondary | ICD-10-CM | POA: Diagnosis not present

## 2022-01-30 DIAGNOSIS — Z992 Dependence on renal dialysis: Secondary | ICD-10-CM | POA: Diagnosis not present

## 2022-01-30 DIAGNOSIS — N186 End stage renal disease: Secondary | ICD-10-CM | POA: Diagnosis not present

## 2022-01-30 DIAGNOSIS — N2581 Secondary hyperparathyroidism of renal origin: Secondary | ICD-10-CM | POA: Diagnosis not present

## 2022-02-01 DIAGNOSIS — E1122 Type 2 diabetes mellitus with diabetic chronic kidney disease: Secondary | ICD-10-CM | POA: Diagnosis not present

## 2022-02-01 DIAGNOSIS — N186 End stage renal disease: Secondary | ICD-10-CM | POA: Diagnosis not present

## 2022-02-01 DIAGNOSIS — Z992 Dependence on renal dialysis: Secondary | ICD-10-CM | POA: Diagnosis not present

## 2022-02-02 DIAGNOSIS — N186 End stage renal disease: Secondary | ICD-10-CM | POA: Diagnosis not present

## 2022-02-02 DIAGNOSIS — N2581 Secondary hyperparathyroidism of renal origin: Secondary | ICD-10-CM | POA: Diagnosis not present

## 2022-02-02 DIAGNOSIS — Z992 Dependence on renal dialysis: Secondary | ICD-10-CM | POA: Diagnosis not present

## 2022-02-04 DIAGNOSIS — N186 End stage renal disease: Secondary | ICD-10-CM | POA: Diagnosis not present

## 2022-02-04 DIAGNOSIS — Z992 Dependence on renal dialysis: Secondary | ICD-10-CM | POA: Diagnosis not present

## 2022-02-04 DIAGNOSIS — N2581 Secondary hyperparathyroidism of renal origin: Secondary | ICD-10-CM | POA: Diagnosis not present

## 2022-02-06 DIAGNOSIS — N2581 Secondary hyperparathyroidism of renal origin: Secondary | ICD-10-CM | POA: Diagnosis not present

## 2022-02-06 DIAGNOSIS — N186 End stage renal disease: Secondary | ICD-10-CM | POA: Diagnosis not present

## 2022-02-06 DIAGNOSIS — Z992 Dependence on renal dialysis: Secondary | ICD-10-CM | POA: Diagnosis not present

## 2022-02-09 DIAGNOSIS — N2581 Secondary hyperparathyroidism of renal origin: Secondary | ICD-10-CM | POA: Diagnosis not present

## 2022-02-09 DIAGNOSIS — N186 End stage renal disease: Secondary | ICD-10-CM | POA: Diagnosis not present

## 2022-02-09 DIAGNOSIS — Z992 Dependence on renal dialysis: Secondary | ICD-10-CM | POA: Diagnosis not present

## 2022-02-11 DIAGNOSIS — Z992 Dependence on renal dialysis: Secondary | ICD-10-CM | POA: Diagnosis not present

## 2022-02-11 DIAGNOSIS — N186 End stage renal disease: Secondary | ICD-10-CM | POA: Diagnosis not present

## 2022-02-11 DIAGNOSIS — N2581 Secondary hyperparathyroidism of renal origin: Secondary | ICD-10-CM | POA: Diagnosis not present

## 2022-02-13 DIAGNOSIS — N2581 Secondary hyperparathyroidism of renal origin: Secondary | ICD-10-CM | POA: Diagnosis not present

## 2022-02-13 DIAGNOSIS — Z992 Dependence on renal dialysis: Secondary | ICD-10-CM | POA: Diagnosis not present

## 2022-02-13 DIAGNOSIS — N186 End stage renal disease: Secondary | ICD-10-CM | POA: Diagnosis not present

## 2022-02-16 DIAGNOSIS — N186 End stage renal disease: Secondary | ICD-10-CM | POA: Diagnosis not present

## 2022-02-16 DIAGNOSIS — Z992 Dependence on renal dialysis: Secondary | ICD-10-CM | POA: Diagnosis not present

## 2022-02-16 DIAGNOSIS — N2581 Secondary hyperparathyroidism of renal origin: Secondary | ICD-10-CM | POA: Diagnosis not present

## 2022-02-17 ENCOUNTER — Other Ambulatory Visit: Payer: Self-pay

## 2022-02-17 ENCOUNTER — Inpatient Hospital Stay: Payer: Medicare HMO | Attending: Oncology | Admitting: Oncology

## 2022-02-17 VITALS — BP 100/55 | HR 95 | Temp 98.6°F | Resp 15 | Wt 136.4 lb

## 2022-02-17 DIAGNOSIS — N186 End stage renal disease: Secondary | ICD-10-CM | POA: Diagnosis not present

## 2022-02-17 DIAGNOSIS — C679 Malignant neoplasm of bladder, unspecified: Secondary | ICD-10-CM | POA: Diagnosis not present

## 2022-02-17 DIAGNOSIS — C7800 Secondary malignant neoplasm of unspecified lung: Secondary | ICD-10-CM | POA: Insufficient documentation

## 2022-02-17 NOTE — Progress Notes (Signed)
Reason for the request:    Bladder cancer  HPI: I was asked by Dr. Tresa Moore to evaluate Joanne Carlson with the evaluation of bladder cancer.  She is a 76 year old woman with history of diabetes, end-stage renal disease as well as radical nephrectomy in 2019.  She presented with symptoms of hematuria and underwent cystoscopy by Dr. Ranelle Oyster in January 2023.  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  She was evaluated by Dr. Tresa Moore for possible radical cystectomy for curative purposes but her disease has progressed rapidly.  CT scan in July showed increase in her bladder mass measuring 12 cm with pelvic adenopathy as well as pulmonary metastasis.  Treatment options were discussed with Dr. Bess Harvest and recommended proceeding with hospice care given her progressive disease and multiple comorbid conditions.  Clinically, she reports feeling reasonably fair without any major complaints.  She denies any abdominal pain or distention.  She denies any hematochezia or melena.  She does not produce urine at this time.  She denies any hematuria.  She does not report any headaches, blurry vision, syncope or seizures. Does not report any fevers, chills or sweats.  Does not report any cough, wheezing or hemoptysis.  Does not report any chest pain, palpitation, orthopnea or leg edema.  Does not report any nausea, vomiting or abdominal pain.  Does not report any constipation or diarrhea.  Does not report any skeletal complaints.    Does not report frequency, urgency or hematuria.  Does not report any skin rashes or lesions. Does not report any heat or cold intolerance.  Does not report any lymphadenopathy or petechiae.  Does not report any anxiety or depression.  Remaining review of systems is negative.     Past Medical History:  Diagnosis Date   Arthritis    "legs, knees; not bad"  (01/30/2018)   Diabetic foot infection ([place]) 10/02/2021   Dizziness    occasionally   ESRD (end stage renal disease) ([place])    [name]; TTS" (01/30/2018)   History of colon polyps    History of gout    takes Allopurinol daily (01/30/2018)   Hyperlipidemia    takes Pravastatin daily   Hypertension    takes Monopril daily   Joint pain    PAD (peripheral artery disease) ([place])    Peripheral neuropathy    Peripheral vascular disease ([place])    Pneumonia    Pre-diabetes    Refusal of blood transfusions as patient is Jehovah's Witness    NO BLOOD OR BLOOD PRODUCTS. Albumin okay.    Stroke [facility]) [facility]s   "mini stroke; left lower mouth a little bit twisted since" (01/30/2018)  :   Past Surgical History:  Procedure Laterality Date   ABDOMINAL AORTOGRAM W/LOWER EXTREMITY N/A 01/30/2018   Procedure: ABDOMINAL AORTOGRAM W/LOWER EXTREMITY;  Surgeon: Lorretta Harp, MD;  Location: [facility];  Service: Cardiovascular;  Laterality: N/A;   ABDOMINAL AORTOGRAM W/LOWER EXTREMITY N/A 03/03/2020   Procedure: ABDOMINAL AORTOGRAM W/LOWER EXTREMITY;  Surgeon: Lorretta Harp, MD;  Location: [facility];  Service: Cardiovascular;  Laterality: N/A;   ABDOMINAL AORTOGRAM W/LOWER EXTREMITY N/A 08/31/2021   Procedure: ABDOMINAL AORTOGRAM W/LOWER EXTREMITY;  Surgeon: Lorretta Harp, MD;  Location: [facility];  Service: Cardiovascular;  Laterality: N/A;   AMPUTATION Left 09/02/2021   Procedure: AMPUTATION GREAT TOE;  Surgeon: Felipa Furnace, DPM;  Location: [facility];  Service: Podiatry;  Laterality: Left;   APPENDECTOMY     BASCILIC VEIN TRANSPOSITION Right 10/03/2013   Procedure: BRACHIOCEPHALIC Arteriovenous Fistula ;  Surgeon: Mal Misty, MD;  Location: [facility];  Service: Vascular;  Laterality: Right;   [place] Right 11/17/2016   Procedure: BASCILIC VEIN TRANSPOSITION- RIGHT SINGLE STAGE;  Surgeon: Conrad Whaleyville, MD;  Location: [facility];  Service: Vascular;   Laterality: Right;   COLONOSCOPY     CYST EXCISION  1980's   cyst removed from lower abdomen   DILATION AND CURETTAGE OF UTERUS  1987   ESOPHAGOGASTRODUODENOSCOPY     PERIPHERAL VASCULAR BALLOON ANGIOPLASTY Right 01/30/2018   Procedure: PERIPHERAL VASCULAR BALLOON ANGIOPLASTY;  Surgeon: Lorretta Harp, MD;  Location: [facility];  Service: Cardiovascular;  Laterality: Right;  superficial femoral   PERIPHERAL VASCULAR BALLOON ANGIOPLASTY Left 08/31/2021   Procedure: PERIPHERAL VASCULAR BALLOON ANGIOPLASTY;  Surgeon: Lorretta Harp, MD;  Location: [facility];  Service: Cardiovascular;  Laterality: Left;   PERIPHERAL VASCULAR INTERVENTION  03/03/2020   Procedure: PERIPHERAL VASCULAR INTERVENTION;  Surgeon: Lorretta Harp, MD;  Location: [facility];  Service: Cardiovascular;;  external iliac stent left sfa lithotripsy/ drug coated balloon   SHOULDER ARTHROSCOPY W/ ROTATOR CUFF REPAIR Right 2013   TRANSURETHRAL RESECTION OF BLADDER TUMOR N/A 07/22/2021   Procedure: TRANSURETHRAL RESECTION OF BLADDER TUMOR (TURBT);  Surgeon: Vira Agar, MD;  Location: [facility];  Service: Urology;  Laterality: N/A;   TUBAL LIGATION  1986   UPPER EXTREMITY VENOGRAPHY Bilateral 11/08/2016   Procedure: Bilateral Upper Extremity Venography;  Surgeon: Conrad Fircrest, MD;  Location: [facility];  Service: Cardiovascular;  Laterality: Bilateral;  :   Current Outpatient Medications:    acetaminophen (TYLENOL) 325 MG tablet, Take 2 tablets (650 mg total) by mouth every 4 (four) hours as needed for headache or mild pain. (Patient not taking: Reported on 09/25/2021), Disp: , Rfl:    allopurinol (ZYLOPRIM) 100 MG tablet, Take 100 mg by mouth daily., Disp: , Rfl:    aspirin EC 81 MG tablet, Take 81 mg by mouth 4 (four) times a week., Disp: , Rfl:    atorvastatin (LIPITOR) 80 MG tablet, TAKE 1 TABLET BY MOUTH ONCE DAILY AT  6PM, Disp: 90 tablet, Rfl: 1   Blood Glucose Monitoring Suppl  (ACCU-CHEK AVIVA PLUS) w/Device KIT, , Disp: , Rfl: 0   calcium acetate (PHOSLO) 667 MG capsule, Take 667 mg by mouth 2 (two) times daily with a meal., Disp: , Rfl:    cinacalcet (SENSIPAR) 60 MG tablet, Take 60 mg by mouth every Monday, Wednesday, and Friday., Disp: , Rfl:    clopidogrel (PLAVIX) 75 MG tablet, Take 1 tablet by mouth once daily, Disp: 90 tablet, Rfl: 1   gabapentin (NEURONTIN) 100 MG capsule, Take 1 capsule by mouth every morning and 1 capsule at bedtime. PATIENT NEEDS OFFICE VISIT FOR ADDITIONAL REFILLS (Patient taking differently: Take 100 mg by mouth 2 (two) times daily as needed (pain).), Disp: 60 capsule, Rfl: 0   glucose blood test strip, Use to test blood sugar daily. Dx code: 65.02., Disp: 100 each, Rfl: 3   Lancets MISC, Use to test blood sugar daily. Dx code 2.02., Disp: 100 each, Rfl: 3   lidocaine-prilocaine (EMLA) cream, Apply 1 application topically Every Tuesday,Thursday,and Saturday with dialysis. , Disp: , Rfl: 12   multivitamin (RENA-VIT) TABS tablet, Take 1 tablet by mouth daily., Disp: ,  Rfl:    oxybutynin (DITROPAN) 5 MG tablet, Take 1 tablet (5 mg total) by mouth 3 (three) times daily as needed for bladder spasms., Disp: 20 tablet, Rfl: 1   oxyCODONE (ROXICODONE) 5 MG immediate release tablet, Take 1 tablet (5 mg total) by mouth every 8 (eight) hours as needed. (Patient not taking: Reported on 09/24/2021), Disp: 15 tablet, Rfl: 0:   Allergies  Allergen Reactions   Blood-Group Specific Substance    Tylenol [Acetaminophen] Rash  :   Family History  Problem Relation Age of Onset   Depression Mother    Hypertension Mother    Depression Father    Depression Sister    Hypertension Sister   :   Social History   Socioeconomic History   Marital status: Widowed    Spouse name: Not on file   Number of children: Not on file   Years of education: Not on file   Highest education level: Not on file  Occupational History   Not on file  Tobacco Use    Smoking status: Former    Packs/day: 0.25    Years: 10.00    Total pack years: 2.50    Types: Cigarettes    Quit date: 61    Years since quitting: 53.6   Smokeless tobacco: Never   Tobacco comments:    "never a heavy smoker; smoked off and on"  Vaping Use   Vaping Use: Never used  Substance and Sexual Activity   Alcohol use: Not Currently    Comment: "in my younger days"   Drug use: Not Currently    Types: Marijuana    Comment: "weed in my teens"   Sexual activity: Not Currently    Birth control/protection: Post-menopausal  Other Topics Concern   Not on file  Social History Narrative   Not on file   Social Determinants of Health   Financial Resource Strain: Not on file  Food Insecurity: No Food Insecurity (01/25/2022)   Hunger Vital Sign    Worried About Running Out of Food in the Last Year: Never true    Ran Out of Food in the Last Year: Never true  Transportation Needs: No Transportation Needs (01/25/2022)   PRAPARE - Hydrologist (Medical): No    Lack of Transportation (Non-Medical): No  Physical Activity: Not on file  Stress: Not on file  Social Connections: Not on file  Intimate Partner Violence: Not on file  :  Pertinent items are noted in HPI.  Exam: Blood pressure (!) 100/55, pulse 95, temperature 98.6 F (37 C), temperature source Oral, resp. rate 15, weight 136 lb 6.4 oz (61.9 kg), SpO2 96 %.  ECOG 1 General appearance: alert and cooperative appeared without distress. Head: atraumatic without any abnormalities. Eyes: conjunctivae/corneas clear. PERRL.  Sclera anicteric. Throat: lips, mucosa, and tongue normal; without oral thrush or ulcers. Resp: clear to auscultation bilaterally without rhonchi, wheezes or dullness to percussion. Cardio: regular rate and rhythm, S1, S2 normal, no murmur, click, rub or gallop GI: soft, non-tender; bowel sounds normal; no masses,  no organomegaly Skin: Skin color, texture, turgor normal. No  rashes or lesions Lymph nodes: Cervical, supraclavicular, and axillary nodes normal. Neurologic: Grossly normal without any motor, sensory or deep tendon reflexes. Musculoskeletal: No joint deformity or effusion.   Assessment and Plan:    76 year old with:  Bladder cancer diagnosed in January 2023 after presenting with hematuria.  She developed progressive disease initially found to have T1 without muscle invasion  and subsequently developed a large bladder mass, pelvic adenopathy and pulmonary nodules indicating stage IV disease.  The natural course of this disease was reviewed at this time and treatment options were discussed.  Given the rapid progression of her disease and the aggressive nature any treatment would be palliative.  Systemic chemotherapy, immunotherapy and antibody drug conjugate among others will likely offer very little therapeutic benefit given the volume of disease she is experiencing.  Supportive care and transitioning to hospice would be the alternative.  After discussion today, she opted against any systemic treatment and opted for supportive care only.  2.  Prognosis: Overall poor with limited life expectancy.  Her overall quality of life is reasonable at this time and would like not to interfere with that.  3.  Follow-up: We will be as needed in the future.   45  minutes were dedicated to this visit. The time was spent on reviewing pathology results, imaging studies, discussing treatment options,  and answering questions regarding future plan.     A copy of this consult has been forwarded to the requesting physician.

## 2022-02-18 DIAGNOSIS — N2581 Secondary hyperparathyroidism of renal origin: Secondary | ICD-10-CM | POA: Diagnosis not present

## 2022-02-18 DIAGNOSIS — N186 End stage renal disease: Secondary | ICD-10-CM | POA: Diagnosis not present

## 2022-02-18 DIAGNOSIS — Z992 Dependence on renal dialysis: Secondary | ICD-10-CM | POA: Diagnosis not present

## 2022-02-20 DIAGNOSIS — Z992 Dependence on renal dialysis: Secondary | ICD-10-CM | POA: Diagnosis not present

## 2022-02-20 DIAGNOSIS — N186 End stage renal disease: Secondary | ICD-10-CM | POA: Diagnosis not present

## 2022-02-20 DIAGNOSIS — N2581 Secondary hyperparathyroidism of renal origin: Secondary | ICD-10-CM | POA: Diagnosis not present

## 2022-02-22 ENCOUNTER — Ambulatory Visit: Payer: Self-pay

## 2022-02-22 NOTE — Patient Outreach (Signed)
  Care Coordination   02/22/2022 Name: SELBY SLOVACEK MRN: [id number] DOB: February 01, 1946   Care Coordination Outreach Attempts:  An unsuccessful telephone outreach was attempted today to offer the patient information about available care coordination services as a benefit of their health plan.   Follow Up Plan:  Additional outreach attempts will be made to offer the patient care coordination information and services.   Encounter Outcome:  No Answer  Care Coordination Interventions Activated:  No   Care Coordination Interventions:  No, not indicated      [facility] [phone]

## 2022-02-23 DIAGNOSIS — N2581 Secondary hyperparathyroidism of renal origin: Secondary | ICD-10-CM | POA: Diagnosis not present

## 2022-02-23 DIAGNOSIS — N186 End stage renal disease: Secondary | ICD-10-CM | POA: Diagnosis not present

## 2022-02-23 DIAGNOSIS — Z992 Dependence on renal dialysis: Secondary | ICD-10-CM | POA: Diagnosis not present

## 2022-02-25 DIAGNOSIS — N2581 Secondary hyperparathyroidism of renal origin: Secondary | ICD-10-CM | POA: Diagnosis not present

## 2022-02-25 DIAGNOSIS — N186 End stage renal disease: Secondary | ICD-10-CM | POA: Diagnosis not present

## 2022-02-25 DIAGNOSIS — Z992 Dependence on renal dialysis: Secondary | ICD-10-CM | POA: Diagnosis not present

## 2022-02-27 DIAGNOSIS — Z992 Dependence on renal dialysis: Secondary | ICD-10-CM | POA: Diagnosis not present

## 2022-02-27 DIAGNOSIS — N186 End stage renal disease: Secondary | ICD-10-CM | POA: Diagnosis not present

## 2022-02-27 DIAGNOSIS — N2581 Secondary hyperparathyroidism of renal origin: Secondary | ICD-10-CM | POA: Diagnosis not present

## 2022-03-02 DIAGNOSIS — N186 End stage renal disease: Secondary | ICD-10-CM | POA: Diagnosis not present

## 2022-03-02 DIAGNOSIS — N2581 Secondary hyperparathyroidism of renal origin: Secondary | ICD-10-CM | POA: Diagnosis not present

## 2022-03-02 DIAGNOSIS — Z992 Dependence on renal dialysis: Secondary | ICD-10-CM | POA: Diagnosis not present

## 2022-03-04 DIAGNOSIS — Z992 Dependence on renal dialysis: Secondary | ICD-10-CM | POA: Diagnosis not present

## 2022-03-04 DIAGNOSIS — E1122 Type 2 diabetes mellitus with diabetic chronic kidney disease: Secondary | ICD-10-CM | POA: Diagnosis not present

## 2022-03-04 DIAGNOSIS — N2581 Secondary hyperparathyroidism of renal origin: Secondary | ICD-10-CM | POA: Diagnosis not present

## 2022-03-04 DIAGNOSIS — N186 End stage renal disease: Secondary | ICD-10-CM | POA: Diagnosis not present

## 2022-03-05 DIAGNOSIS — T82858A Stenosis of vascular prosthetic devices, implants and grafts, initial encounter: Secondary | ICD-10-CM | POA: Diagnosis not present

## 2022-03-05 DIAGNOSIS — Z992 Dependence on renal dialysis: Secondary | ICD-10-CM | POA: Diagnosis not present

## 2022-03-05 DIAGNOSIS — I871 Compression of vein: Secondary | ICD-10-CM | POA: Diagnosis not present

## 2022-03-05 DIAGNOSIS — N186 End stage renal disease: Secondary | ICD-10-CM | POA: Diagnosis not present

## 2022-03-06 DIAGNOSIS — N2581 Secondary hyperparathyroidism of renal origin: Secondary | ICD-10-CM | POA: Diagnosis not present

## 2022-03-06 DIAGNOSIS — Z992 Dependence on renal dialysis: Secondary | ICD-10-CM | POA: Diagnosis not present

## 2022-03-06 DIAGNOSIS — N186 End stage renal disease: Secondary | ICD-10-CM | POA: Diagnosis not present

## 2022-03-09 DIAGNOSIS — Z992 Dependence on renal dialysis: Secondary | ICD-10-CM | POA: Diagnosis not present

## 2022-03-09 DIAGNOSIS — N186 End stage renal disease: Secondary | ICD-10-CM | POA: Diagnosis not present

## 2022-03-09 DIAGNOSIS — N2581 Secondary hyperparathyroidism of renal origin: Secondary | ICD-10-CM | POA: Diagnosis not present

## 2022-03-10 ENCOUNTER — Telehealth: Payer: Self-pay | Admitting: *Deleted

## 2022-03-10 NOTE — Telephone Encounter (Signed)
-----   Message from Wyatt Portela, MD sent at 03/10/2022  2:45 PM EDT ----- Yes. Thanks ----- Message ----- From: Rolene Course, RN Sent: 03/10/2022   2:30 PM EDT To: Wyatt Portela, MD  This patient's SW from her dialysis center called & asked if she could have a palliative care referral.  Are you ok with this?

## 2022-03-10 NOTE — Telephone Encounter (Signed)
PC to [name] RN with [facility], informed her of palliative care referral.  [facility] will be contacting patient.

## 2022-03-11 ENCOUNTER — Encounter (HOSPITAL_COMMUNITY): Payer: Self-pay | Admitting: Nephrology

## 2022-03-11 ENCOUNTER — Telehealth: Payer: Self-pay

## 2022-03-11 DIAGNOSIS — N2581 Secondary hyperparathyroidism of renal origin: Secondary | ICD-10-CM | POA: Diagnosis not present

## 2022-03-11 DIAGNOSIS — N186 End stage renal disease: Secondary | ICD-10-CM | POA: Diagnosis not present

## 2022-03-11 DIAGNOSIS — Z992 Dependence on renal dialysis: Secondary | ICD-10-CM | POA: Diagnosis not present

## 2022-03-11 NOTE — Telephone Encounter (Signed)
(  1:35 pm) PC SW scheduled initial palliative care visit with patient. Visit is scheduled with RN/SW clinical team for 03/15/22 @ 10:30 am.

## 2022-03-13 DIAGNOSIS — N186 End stage renal disease: Secondary | ICD-10-CM | POA: Diagnosis not present

## 2022-03-13 DIAGNOSIS — N2581 Secondary hyperparathyroidism of renal origin: Secondary | ICD-10-CM | POA: Diagnosis not present

## 2022-03-13 DIAGNOSIS — Z992 Dependence on renal dialysis: Secondary | ICD-10-CM | POA: Diagnosis not present

## 2022-03-15 ENCOUNTER — Other Ambulatory Visit: Payer: Medicaid Other | Admitting: *Deleted

## 2022-03-15 ENCOUNTER — Other Ambulatory Visit: Payer: Medicaid Other

## 2022-03-15 DIAGNOSIS — Z515 Encounter for palliative care: Secondary | ICD-10-CM

## 2022-03-15 NOTE — Progress Notes (Signed)
COMMUNITY PALLIATIVE CARE SW NOTE  PATIENT NAME: Joanne Carlson DOB: 1945-10-06 MRN: [id number]  PRIMARY CARE PROVIDER: Vernie Shanks, MD (Inactive)  RESPONSIBLE PARTY:  Acct ID - Guarantor Home Phone Work Phone Relationship Acct Type  [id number] - Horne,[name] [phone]  Self P/F     [address], [place], [place] [address]   SOCIAL WORK VISIT  PC SW and RN-M. Nadara Mustard completed a face-to-face visit with patient at her home. Her son-Cortez was present with her. Patient was sitting in a chair in her room. She was awake, alert and oriented x3 and provided a status update on herself. Patient slid from her bed this morning. She did not receive any injuries. Her son assisted her back up to her chair. Patient goes to dialysis 3x week (Tues., Thurs., & Saturday). She report that she is tolerating this well. She uses a rollator to ambulate independently. Patient reported "feeling okay". She report some discomfort in her left leg. Patient observed rubbing her leg throughout the visit. She also reported pain to her pelvic area, which is where she has cancer. She report that she has pain medication available if needed. Patient is anticipating getting treatment for her cancer. She report that her appetite is good where she is eating at least 2 meals per day and she will snack on protein bars. Patient report that she is sleeping  well at night and naps during the day. Patient has an appointment on the 27 th with her cancer doctor.  SW clarified patient's insurance (Medicaid and [employer]). SW left copies of advance directives per request of patient's daughter-SW to follow-up when appropriate.   CODE STATUS: Full Code ADVANCED DIRECTIVES: No MOST FORM COMPLETE: No HOSPICE EDUCATION PROVIDED: None  Duration of visit and documentation: 60 minutes   Aerie Donica, LCSW

## 2022-03-16 DIAGNOSIS — N2581 Secondary hyperparathyroidism of renal origin: Secondary | ICD-10-CM | POA: Diagnosis not present

## 2022-03-16 DIAGNOSIS — Z992 Dependence on renal dialysis: Secondary | ICD-10-CM | POA: Diagnosis not present

## 2022-03-16 DIAGNOSIS — N186 End stage renal disease: Secondary | ICD-10-CM | POA: Diagnosis not present

## 2022-03-17 ENCOUNTER — Ambulatory Visit: Payer: Medicare HMO | Attending: Cardiovascular Disease | Admitting: Cardiovascular Disease

## 2022-03-17 ENCOUNTER — Encounter: Payer: Self-pay | Admitting: Cardiovascular Disease

## 2022-03-17 VITALS — BP 90/42 | HR 89 | Ht 64.0 in | Wt 134.4 lb

## 2022-03-17 DIAGNOSIS — I70261 Atherosclerosis of native arteries of extremities with gangrene, right leg: Secondary | ICD-10-CM

## 2022-03-17 DIAGNOSIS — I739 Peripheral vascular disease, unspecified: Secondary | ICD-10-CM | POA: Diagnosis not present

## 2022-03-17 DIAGNOSIS — Z95828 Presence of other vascular implants and grafts: Secondary | ICD-10-CM

## 2022-03-17 DIAGNOSIS — E7800 Pure hypercholesterolemia, unspecified: Secondary | ICD-10-CM

## 2022-03-17 NOTE — Progress Notes (Signed)
03/17/2022 Joanne Carlson   [birth year]  [id number]  Primary Physician Vernie Shanks, MD (Inactive) Primary Cardiologist: Lorretta Harp MD Garret Reddish, [place], [place]  HPI:  Joanne Carlson is a 76 y.o.  mother of 2 children referred by Dr. Amalia Hailey for peripheral vascular evaluation because of critical limb ischemia.  I last saw her in the office 09/24/2021.  She is accompanied by her daughter [name] today.  She has a history of treated hypertension, diabetes and hyperlipidemia. 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 Dr. Adele Barthel placed an AV fistula. She's never had a heart attack or stroke. She denies chest pain or shortness of breath. She's had a small non-healing ulcer on her left great toe treated with antibiotics recently and according to her this is slowly healing. Lower extremity arterial Doppler studies performed in our office 01/03/17 revealed ABIs approximately 0.8 bilaterally with an occluded distal right SFA and high-grade left SFA stenosis with three-vessel runoff.   She had developed a nonhealing wound on her right great toe.  She is currently on hemodialysis.  I angiogram to her 01/30/2018 revealing a 99% calcified mid right SFA stenosis.  I performed chocolate balloon atherectomy followed by drug-eluting balloon angioplasty with excellent result.  Dopplers improved.  Her wound ultimately healed.   I performed peripheral angiography on her 03/03/2020 revealing minimal disease in the mid and distal right SFA in the 50% range, patent left external iliac artery stent with high-grade segmental calcified proximal mid left SFA stenosis.  She underwent shockwave intravascular lithotripsy and drug-coated balloon angioplasty with excellent result.  Follow-up Dopplers showed marked improvement as did her claudication.  She did complain of some pain in her right inguinal area which does not sound vascular.  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  Dr. March Rummage, her podiatrist, is still working on the dry gangrene at the tip of her left toe.  Her last Doppler studies reveal her left ABI to be noncompressible although her last angiogram performed 03/03/2020 revealed a patent left iliac stent.  I performed shockwave angioplasty followed by [facility] of her entire proximal and mid left SFA with excellent result.  She did have three-vessel runoff.  She is scheduled to have cystoscopy and potential urologic surgery on bladder tumors because of hematuria next week.  I believe it would be safe for her to come off her antiplatelet therapy prior to that.  She denies chest pain or shortness of breath.  She was admitted 08/29/2021 with critical limb ischemia and gangrene of her left great toe.  2 days later I performed peripheral angiography and intervention on a 99% left popliteal artery stenosis with an excellent result.  Several days after that she underwent left great toe amputation by Dr. Posey Pronto with excellent salt.  She was placed on antibiotics.  Today she says she has some pus drainage from her left great toe and is scheduled to see Dr. Posey Pronto in the office.  Since I saw her 6 months ago she and unfortunately has developed metastatic bladder cancer that is apparently not treatable.  She denies chest pain or shortness of breath.  She is somewhat hypotensive today.  She is tolerating her hemodialysis.  She denies claudication.   Current Meds  Medication Sig   allopurinol (ZYLOPRIM) 100 MG tablet Take 100 mg by mouth daily.   aspirin EC 81 MG tablet Take 81 mg by mouth  4 (four) times a week.   atorvastatin (LIPITOR) 80 MG tablet TAKE 1 TABLET BY MOUTH ONCE DAILY AT  6PM   Blood Glucose Monitoring Suppl (ACCU-CHEK AVIVA PLUS) w/Device KIT    calcium acetate (PHOSLO) 667 MG capsule Take 667 mg by mouth 2 (two) times daily with a meal.   cinacalcet (SENSIPAR) 60 MG tablet Take 60 mg by mouth every Monday, Wednesday, and Friday.    clopidogrel (PLAVIX) 75 MG tablet Take 1 tablet by mouth once daily   gabapentin (NEURONTIN) 100 MG capsule Take 1 capsule by mouth every morning and 1 capsule at bedtime. PATIENT NEEDS OFFICE VISIT FOR ADDITIONAL REFILLS (Patient taking differently: Take 100 mg by mouth 2 (two) times daily as needed (pain).)   glucose blood test strip Use to test blood sugar daily. Dx code: 48.02.   Lancets MISC Use to test blood sugar daily. Dx code 250.02.   lidocaine-prilocaine (EMLA) cream Apply 1 application topically Every Tuesday,Thursday,and Saturday with dialysis.    multivitamin (RENA-VIT) TABS tablet Take 1 tablet by mouth daily.   oxybutynin (DITROPAN) 5 MG tablet Take 1 tablet (5 mg total) by mouth 3 (three) times daily as needed for bladder spasms.   oxyCODONE (ROXICODONE) 5 MG immediate release tablet Take 1 tablet (5 mg total) by mouth every 8 (eight) hours as needed.   VELPHORO 500 MG chewable tablet Chew 500 mg by mouth daily.     Allergies  Allergen Reactions   Blood-Group Specific Substance    Tylenol [Acetaminophen] Rash    Social History   Socioeconomic History   Marital status: Widowed    Spouse name: Not on file   Number of children: Not on file   Years of education: Not on file   Highest education level: Not on file  Occupational History   Not on file  Tobacco Use   Smoking status: Former    Packs/day: 0.25    Years: 10.00    Total pack years: 2.50    Types: Cigarettes    Quit date: 57    Years since quitting: 53.7   Smokeless tobacco: Never   Tobacco comments:    "never a heavy smoker; smoked off and on"  Vaping Use   Vaping Use: Never used  Substance and Sexual Activity   Alcohol use: Not Currently    Comment: "in my younger days"   Drug use: Not Currently    Types: Marijuana    Comment: "weed in my teens"   Sexual activity: Not Currently    Birth control/protection: Post-menopausal  Other Topics Concern   Not on file  Social History Narrative   Not on  file   Social Determinants of Health   Financial Resource Strain: Not on file  Food Insecurity: No Food Insecurity (01/25/2022)   Hunger Vital Sign    Worried About Running Out of Food in the Last Year: Never true    Ran Out of Food in the Last Year: Never true  Transportation Needs: No Transportation Needs (01/25/2022)   PRAPARE - Hydrologist (Medical): No    Lack of Transportation (Non-Medical): No  Physical Activity: Not on file  Stress: Not on file  Social Connections: Not on file  Intimate Partner Violence: Not on file     Review of Systems: General: negative for chills, fever, night sweats or weight changes.  Cardiovascular: negative for chest pain, dyspnea on exertion, edema, orthopnea, palpitations, paroxysmal nocturnal dyspnea or shortness of breath Dermatological: negative  for rash Respiratory: negative for cough or wheezing Urologic: negative for hematuria Abdominal: negative for nausea, vomiting, diarrhea, bright red blood per rectum, melena, or hematemesis Neurologic: negative for visual changes, syncope, or dizziness All other systems reviewed and are otherwise negative except as noted above.    Blood pressure (!) 90/42, pulse 89, height 5' 4"  (1.626 m), weight 134 lb 6.4 oz (61 kg).  General appearance: alert and no distress Neck: no adenopathy, no JVD, supple, symmetrical, trachea midline, thyroid not enlarged, symmetric, no tenderness/mass/nodules, and bilateral carotid bruits Lungs: clear to auscultation bilaterally Heart: 2/6 outflow tract murmur consistent with aortic stenosis Extremities: extremities normal, atraumatic, no cyanosis or edema Pulses: Diminished pedal pulses Skin: Skin color, texture, turgor normal. No rashes or lesions Neurologic: Grossly normal  EKG sinus rhythm at 89 with low limb voltage and poor R wave progression.  I personally reviewed this EKG.  ASSESSMENT AND PLAN:   HYPERCHOLESTEROLEMIA History of  hyperlipidemia on statin therapy with lipid profile performed 09/21/2021 revealing total cholesterol 87, LDL 44 and HDL of 26.  Atherosclerosis of native arteries of the extremities with gangrene ([place]) History of PAD status post multiple interventions by myself on bilateral SFAs and her left iliac for both claudication and critical limb ischemia.  Her most recent Doppler study performed/10/23 revealed noncompressible ABIs with patent left SFA and popliteal artery.  She denies claudication.  There is no evidence of critical ischemia.     Lorretta Harp MD FACP,FACC,FAHA, [facility] 03/17/2022 4:05 PM

## 2022-03-17 NOTE — Patient Instructions (Signed)
Medication Instructions:  Your physician recommends that you continue on your current medications as directed. Please refer to the Current Medication list given to you today.  *If you need a refill on your cardiac medications before your next appointment, please call your pharmacy*   Follow-Up: At [facility], you and your health needs are our priority.  As part of our continuing mission to provide you with exceptional heart care, we have created designated Provider Care Teams.  These Care Teams include your primary Cardiologist (physician) and Advanced Practice Providers (APPs -  Physician Assistants and Nurse Practitioners) who all work together to provide you with the care you need, when you need it.  We recommend signing up for the patient portal called "MyChart".  Sign up information is provided on this After Visit Summary.  MyChart is used to connect with patients for Virtual Visits (Telemedicine).  Patients are able to view lab/test results, encounter notes, upcoming appointments, etc.  Non-urgent messages can be sent to your provider as well.   To learn more about what you can do with MyChart, go to [email or web address].    Your next appointment:   12 month(s)  The format for your next appointment:   In Person  Provider:   Jonathan Berry, MD   

## 2022-03-17 NOTE — Assessment & Plan Note (Signed)
History of hyperlipidemia on statin therapy with lipid profile performed 09/21/2021 revealing total cholesterol 87, LDL 44 and HDL of 26.

## 2022-03-17 NOTE — Assessment & Plan Note (Signed)
History of PAD status post multiple interventions by myself on bilateral SFAs and her left iliac for both claudication and critical limb ischemia.  Her most recent Doppler study performed/10/23 revealed noncompressible ABIs with patent left SFA and popliteal artery.  She denies claudication.  There is no evidence of critical ischemia.

## 2022-03-17 NOTE — Progress Notes (Signed)
[facility] RN NOTE  PATIENT NAME: Joanne Carlson DOB: [birth year] MRN: [id number]  PRIMARY CARE PROVIDER: Vernie Shanks, MD (Inactive)  RESPONSIBLE PARTY: Kathrynn Ducking (daughter) Acct ID - Guarantor Home Phone Work Phone Relationship Acct Type  [id number] - [name],[name] [id number]  Self P/F     [address], [place], [place] [id number]    RN/SW initial palliative care consult visit completed with patient in her home. Palliative care referral received from Dr. Alen Blew. Son [name] also present during visit.  Pain: Patient reports mild aching pain in her lower back, pelvic region (from bladder cancer) and left leg. PRN Oxycodone is effective along with Gabapentin.  Respiratory: Denies shortness of breath and none noted throughout visit.  Cardiac: Trace edema noted to bilateral feet. Encouraged to keep feet elevated when possible.  Mobility: Patient had a fall this morning where he legs gave out and she slid to the floor. No apparent injuries. Ambulates using a rollator walker. Independent with personal care. Says she tries to go to [employer] to exercise. Last time she went was 3 weeks ago.  Appetite: Eats 2 meals/day and snacks occasional. Sometimes will eat a protein bar. Denies dysphagia. Takes medications as prescribed and without difficulty.  Genitourinary: When asked if she still produces some urine, she replied " I can't tell because blood is always coming out when I go to the bathroom." CKD Stage 5 and currently on dialysis Tu/Th/Sa. Fistula in right upper arm. She has been completing her dialysis sessions without issues at this time.  GI: No issues. Reports having normal BMs.  Goals of care: She wants to see if there is any treatments she can receive for her bladder cancer. She wants to remain in her home. Healthcare POA paperwork left in the home as requested by her daughter. Patient agrees to future visits with palliative  care.  HISTORY OF PRESENT ILLNESS:  This is a 76 yo female with a diagnosis of bladder cancer. She has a hx of HTN, lower limb ischemia, atherosclerosis, aortic stenosis, PVD, DM II, CKD stage 5 and on dialysis 3x/week. Palliative care team has been asked to follow patient for goals of care, symptom management assistance and complex decision making.  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

## 2022-03-18 DIAGNOSIS — N2581 Secondary hyperparathyroidism of renal origin: Secondary | ICD-10-CM | POA: Diagnosis not present

## 2022-03-18 DIAGNOSIS — N186 End stage renal disease: Secondary | ICD-10-CM | POA: Diagnosis not present

## 2022-03-18 DIAGNOSIS — Z992 Dependence on renal dialysis: Secondary | ICD-10-CM | POA: Diagnosis not present

## 2022-03-20 DIAGNOSIS — Z992 Dependence on renal dialysis: Secondary | ICD-10-CM | POA: Diagnosis not present

## 2022-03-20 DIAGNOSIS — N186 End stage renal disease: Secondary | ICD-10-CM | POA: Diagnosis not present

## 2022-03-20 DIAGNOSIS — N2581 Secondary hyperparathyroidism of renal origin: Secondary | ICD-10-CM | POA: Diagnosis not present

## 2022-03-23 DIAGNOSIS — N186 End stage renal disease: Secondary | ICD-10-CM | POA: Diagnosis not present

## 2022-03-23 DIAGNOSIS — N2581 Secondary hyperparathyroidism of renal origin: Secondary | ICD-10-CM | POA: Diagnosis not present

## 2022-03-23 DIAGNOSIS — Z992 Dependence on renal dialysis: Secondary | ICD-10-CM | POA: Diagnosis not present

## 2022-03-25 DIAGNOSIS — N2581 Secondary hyperparathyroidism of renal origin: Secondary | ICD-10-CM | POA: Diagnosis not present

## 2022-03-25 DIAGNOSIS — Z992 Dependence on renal dialysis: Secondary | ICD-10-CM | POA: Diagnosis not present

## 2022-03-25 DIAGNOSIS — N186 End stage renal disease: Secondary | ICD-10-CM | POA: Diagnosis not present

## 2022-03-27 DIAGNOSIS — N2581 Secondary hyperparathyroidism of renal origin: Secondary | ICD-10-CM | POA: Diagnosis not present

## 2022-03-27 DIAGNOSIS — Z992 Dependence on renal dialysis: Secondary | ICD-10-CM | POA: Diagnosis not present

## 2022-03-27 DIAGNOSIS — N186 End stage renal disease: Secondary | ICD-10-CM | POA: Diagnosis not present

## 2022-03-29 DIAGNOSIS — C775 Secondary and unspecified malignant neoplasm of intrapelvic lymph nodes: Secondary | ICD-10-CM | POA: Diagnosis not present

## 2022-03-29 DIAGNOSIS — Z905 Acquired absence of kidney: Secondary | ICD-10-CM | POA: Diagnosis not present

## 2022-03-29 DIAGNOSIS — C678 Malignant neoplasm of overlapping sites of bladder: Secondary | ICD-10-CM | POA: Diagnosis not present

## 2022-03-29 DIAGNOSIS — N186 End stage renal disease: Secondary | ICD-10-CM | POA: Diagnosis not present

## 2022-03-29 DIAGNOSIS — Z515 Encounter for palliative care: Secondary | ICD-10-CM | POA: Diagnosis not present

## 2022-03-30 DIAGNOSIS — N2581 Secondary hyperparathyroidism of renal origin: Secondary | ICD-10-CM | POA: Diagnosis not present

## 2022-03-30 DIAGNOSIS — Z992 Dependence on renal dialysis: Secondary | ICD-10-CM | POA: Diagnosis not present

## 2022-03-30 DIAGNOSIS — N186 End stage renal disease: Secondary | ICD-10-CM | POA: Diagnosis not present

## 2022-04-01 DIAGNOSIS — Z992 Dependence on renal dialysis: Secondary | ICD-10-CM | POA: Diagnosis not present

## 2022-04-01 DIAGNOSIS — N2581 Secondary hyperparathyroidism of renal origin: Secondary | ICD-10-CM | POA: Diagnosis not present

## 2022-04-01 DIAGNOSIS — N186 End stage renal disease: Secondary | ICD-10-CM | POA: Diagnosis not present

## 2022-04-03 DIAGNOSIS — N2581 Secondary hyperparathyroidism of renal origin: Secondary | ICD-10-CM | POA: Diagnosis not present

## 2022-04-03 DIAGNOSIS — N186 End stage renal disease: Secondary | ICD-10-CM | POA: Diagnosis not present

## 2022-04-03 DIAGNOSIS — E1122 Type 2 diabetes mellitus with diabetic chronic kidney disease: Secondary | ICD-10-CM | POA: Diagnosis not present

## 2022-04-03 DIAGNOSIS — Z992 Dependence on renal dialysis: Secondary | ICD-10-CM | POA: Diagnosis not present

## 2022-04-08 ENCOUNTER — Other Ambulatory Visit: Payer: Self-pay | Admitting: Family Medicine

## 2022-04-08 ENCOUNTER — Other Ambulatory Visit: Payer: Self-pay | Admitting: Emergency Medicine

## 2022-04-08 DIAGNOSIS — Z1231 Encounter for screening mammogram for malignant neoplasm of breast: Secondary | ICD-10-CM

## 2022-04-19 ENCOUNTER — Encounter (HOSPITAL_COMMUNITY): Payer: Self-pay | Admitting: Nephrology

## 2022-04-19 ENCOUNTER — Emergency Department (HOSPITAL_COMMUNITY): Payer: Medicare Other

## 2022-04-19 ENCOUNTER — Encounter (HOSPITAL_COMMUNITY): Payer: Self-pay

## 2022-04-19 ENCOUNTER — Emergency Department (HOSPITAL_COMMUNITY)
Admission: EM | Admit: 2022-04-19 | Discharge: 2022-04-19 | Disposition: A | Discharge status: Home / Self Care | Payer: Medicare Other | Attending: Student | Admitting: Student

## 2022-04-19 ENCOUNTER — Other Ambulatory Visit: Payer: Self-pay

## 2022-04-19 DIAGNOSIS — I12 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 stage renal disease: Secondary | ICD-10-CM | POA: Insufficient documentation

## 2022-04-19 DIAGNOSIS — Z87891 Personal history of nicotine dependence: Secondary | ICD-10-CM | POA: Diagnosis not present

## 2022-04-19 DIAGNOSIS — Z992 Dependence on renal dialysis: Secondary | ICD-10-CM | POA: Insufficient documentation

## 2022-04-19 DIAGNOSIS — E1122 Type 2 diabetes mellitus with diabetic chronic kidney disease: Secondary | ICD-10-CM | POA: Insufficient documentation

## 2022-04-19 DIAGNOSIS — N186 End stage renal disease: Secondary | ICD-10-CM | POA: Insufficient documentation

## 2022-04-19 DIAGNOSIS — Z7902 Long term (current) use of antithrombotics/antiplatelets: Secondary | ICD-10-CM | POA: Insufficient documentation

## 2022-04-19 DIAGNOSIS — Z8551 Personal history of malignant neoplasm of bladder: Secondary | ICD-10-CM | POA: Diagnosis not present

## 2022-04-19 DIAGNOSIS — M5412 Radiculopathy, cervical region: Secondary | ICD-10-CM | POA: Insufficient documentation

## 2022-04-19 DIAGNOSIS — Z7982 Long term (current) use of aspirin: Secondary | ICD-10-CM | POA: Diagnosis not present

## 2022-04-19 DIAGNOSIS — R200 Anesthesia of skin: Secondary | ICD-10-CM | POA: Diagnosis present

## 2022-04-19 LAB — COMPREHENSIVE METABOLIC PANEL
ALT: 14 U/L (ref 0–44)
AST: 27 U/L (ref 15–41)
Albumin: 2.6 g/dL — ABNORMAL LOW (ref 3.5–5.0)
Alkaline Phosphatase: 75 U/L (ref 38–126)
Anion gap: 13 (ref 5–15)
BUN: 38 mg/dL — ABNORMAL HIGH (ref 8–23)
CO2: 30 mmol/L (ref 22–32)
Calcium: 8.5 mg/dL — ABNORMAL LOW (ref 8.9–10.3)
Chloride: 95 mmol/L — ABNORMAL LOW (ref 98–111)
Creatinine, Ser: 6.56 mg/dL — ABNORMAL HIGH (ref 0.44–1.00)
GFR, Estimated: 6 mL/min — ABNORMAL LOW (ref >60)
Glucose, Bld: 78 mg/dL (ref 70–99)
Potassium: 4.6 mmol/L (ref 3.5–5.1)
Sodium: 138 mmol/L (ref 135–145)
Total Bilirubin: 0.7 mg/dL (ref 0.3–1.2)
Total Protein: 8 g/dL (ref 6.5–8.1)

## 2022-04-19 LAB — CBC WITH DIFFERENTIAL/PLATELET
Abs Immature Granulocytes: 0.05 10*3/uL (ref 0.00–0.07)
Basophils Absolute: 0 10*3/uL (ref 0.0–0.1)
Basophils Relative: 1 %
Eosinophils Absolute: 0.2 10*3/uL (ref 0.0–0.5)
Eosinophils Relative: 2 %
HCT: 54 % — ABNORMAL HIGH (ref 36.0–46.0)
Hemoglobin: 16.8 g/dL — ABNORMAL HIGH (ref 12.0–15.0)
Immature Granulocytes: 1 %
Lymphocytes Relative: 12 %
Lymphs Abs: 1 10*3/uL (ref 0.7–4.0)
MCH: 30 pg (ref 26.0–34.0)
MCHC: 31.1 g/dL (ref 30.0–36.0)
MCV: 96.4 fL (ref 80.0–100.0)
Monocytes Absolute: 0.5 10*3/uL (ref 0.1–1.0)
Monocytes Relative: 6 %
Neutro Abs: 6.6 10*3/uL (ref 1.7–7.7)
Neutrophils Relative %: 78 %
Platelets: 177 10*3/uL (ref 150–400)
RBC: 5.6 MIL/uL — ABNORMAL HIGH (ref 3.87–5.11)
RDW: 17.4 % — ABNORMAL HIGH (ref 11.5–15.5)
WBC: 8.3 10*3/uL (ref 4.0–10.5)
nRBC: 0 % (ref 0.0–0.2)

## 2022-04-19 LAB — TROPONIN I (HIGH SENSITIVITY): Troponin I (High Sensitivity): 7 ng/L (ref <18)

## 2022-04-19 MED ORDER — GADOBUTROL 1 MMOL/ML IV SOLN
6.0000 mL | Freq: Once | INTRAVENOUS | Status: AC | PRN
  Administered 2022-04-19: 6 mL via INTRAVENOUS

## 2022-04-19 NOTE — ED Notes (Signed)
Unable to obtain second troponin from IV

## 2022-04-19 NOTE — ED Notes (Signed)
Patient transported to MRI 

## 2022-04-19 NOTE — ED Triage Notes (Addendum)
Pt states that her left arm was numb and painful this morning. Pt reports a hx of stroke. Pt has left sided deficits from a previous stroke.

## 2022-04-19 NOTE — ED Provider Notes (Signed)
Patient's labs without significant abnormalities.  Patient is a end-stage renal disease dialysis patient.  Normally dialyzed Tuesdays Thursdays and Saturdays.  Patient was dialyzed on Saturday.  Her electrolytes without significant abnormalities.  Patient came for left hand numbness the question was whether was peripheral or may be cervical in nature.  Patient is also had a stroke in the past.  Numbness is predominantly to her index middle and ring finger.  No significant weakness.  MRI brain without anything acute.  MRI cervical spine ordered by daytime emergency physician does have multilevel degenerative disc and joint changes there is mild C3-4 moderate C4-C5 mild to moderate C5-C6 and mild C6-C7 central canal stenosis there is also multilevel neuroforaminal stenosis including severe left C3-4 and moderate to severe right and moderate left C4-5 neuroforaminal stenosis.  So these could possibly be interfering.  Will have patient follow-up with neurosurgery to make sure that the numbness resolves.  No evidence of acute stroke.   Fredia Sorrow, MD 04/19/22 2112

## 2022-04-19 NOTE — ED Notes (Signed)
Unable to obtain VS due to pt being in MRI. 

## 2022-04-19 NOTE — ED Provider Notes (Signed)
[facility] DEPT Provider Note  CSN: [id number] Arrival date & time: 04/19/22 6962  Chief Complaint(s) arm numbness and Arm Pain  HPI Joanne Carlson is a 76 y.o. female with PMH T2DM, ESRD on hemodialysis Tuesday Thursday Saturday, HTN, HLD, previous CVA, PAD who presents emergency department for evaluation of arm numbness and pain.  Patient states that she fell asleep with her arms outstretched over her walker and woke up at approximately 3 AM with left-sided numbness to digits 3 and 4 that extended up into the mid forearm at the level of compression.  She also endorsed left-sided facial numbness and left lower extremity numbness that have since improved.  She states that she feels like her left hand is clumsy and is worried that she had another stroke.  Last known well 10 PM on 04/18/2022.  Here she endorses mild persistent numbness but denies any additional neurologic complaints in the other extremities.  Denies chest pain, shortness of breath, abdominal pain, nausea, vomiting or other systemic symptoms.   Past Medical History Past Medical History:  Diagnosis Date   Arthritis    "legs, knees; not bad" (01/30/2018)   Diabetic foot infection ([place]) 10/02/2021   Dizziness    occasionally   ESRD (end stage renal disease) ([place])    [name]; TTS" (01/30/2018)   History of colon polyps    History of gout    takes Allopurinol daily (01/30/2018)   Hyperlipidemia    takes Pravastatin daily   Hypertension    takes Monopril daily   Joint pain    PAD (peripheral artery disease) ([place])    Peripheral neuropathy    Peripheral vascular disease ([place])    Pneumonia    Pre-diabetes    Refusal of blood transfusions as patient is Jehovah's Witness    NO BLOOD OR BLOOD PRODUCTS. Albumin okay.    Stroke [facility]) 1990s   "mini stroke; left lower mouth a little bit twisted since" (01/30/2018)   Patient Active Problem List   Diagnosis Date Noted   Diabetic foot infection  ([place]) 10/02/2021   Cellulitis    Medication monitoring encounter    Osteomyelitis ([place]) 08/29/2021   PVD (peripheral vascular disease) ([place]) 08/29/2021   Bladder cancer ([place]) 07/22/2021   Aortic stenosis 04/08/2021   Lumbar radiculopathy 10/15/2020   Hypercalcemia 04/25/2020   Claudication in peripheral vascular disease ([place]) 03/03/2020   Pain of left hand 12/24/2019   Allergy, unspecified, initial encounter 02/05/2019   Anaphylactic shock, unspecified, initial encounter 02/05/2019   Encounter for removal of sutures 07/15/2018   Iron deficiency anemia, unspecified 02/27/2018   Atherosclerosis of native arteries of the extremities with gangrene ([place]) 11/23/2017   Critical lower limb ischemia ([place]) 01/07/2017   Shortness of breath 12/04/2016   Unspecified protein-calorie malnutrition ([place]) 12/02/2016   Encounter for immunization 11/22/2016   Dependence on renal dialysis ([place]) 11/16/2016   Coagulation defect, unspecified ([place]) 10/19/2016   Dorsalgia, unspecified 10/12/2016   Gout, unspecified 10/12/2016   Hyperkalemia 10/12/2016   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 stage renal disease ([place]) 10/12/2016   Morbid (severe) obesity due to excess calories ([place]) 10/12/2016   Secondary hyperparathyroidism of renal origin ([place]) 10/12/2016   Chronic renal insufficiency, stage IV (severe) ([place]) 11/13/2013   End stage renal disease ([place]) 07/20/2013   Mechanical complication of other vascular device, implant, and graft 07/20/2013   Other specified disorders of kidney and ureter 11/30/2011   History of recurrent transient ischemic  attacks 09/27/2011   HYPERCHOLESTEROLEMIA 08/07/2008   ANEMIA 08/07/2008   RENAL INSUFFICIENCY 08/07/2008   ALLERGIC RHINITIS 03/04/2008   Type 2 diabetes mellitus without complication, with long-term current use of insulin ([place]) 03/15/2007   HYPERTENSION, BENIGN 03/15/2007   Home Medication(s) Prior to Admission  medications   Medication Sig Start Date End Date Taking? Authorizing Provider  acetaminophen (TYLENOL) 325 MG tablet Take 2 tablets (650 mg total) by mouth every 4 (four) hours as needed for headache or mild pain. 09/04/21   Eugenie Filler, MD  allopurinol (ZYLOPRIM) 100 MG tablet Take 100 mg by mouth daily. 05/25/13   [provider]  aspirin EC 81 MG tablet Take 81 mg by mouth 4 (four) times a week.    [provider]  atorvastatin (LIPITOR) 80 MG tablet TAKE 1 TABLET BY MOUTH ONCE DAILY AT  6PM 12/25/21   Lorretta Harp, MD  Blood Glucose Monitoring Suppl (ACCU-CHEK AVIVA PLUS) w/Device KIT  10/29/17   [provider]  calcium acetate (PHOSLO) 667 MG capsule Take 667 mg by mouth 2 (two) times daily with a meal. 06/22/13   [provider]  cinacalcet (SENSIPAR) 60 MG tablet Take 60 mg by mouth every Monday, Wednesday, and Friday.    [provider]  clopidogrel (PLAVIX) 75 MG tablet Take 1 tablet by mouth once daily 11/27/21   Lorretta Harp, MD  gabapentin (NEURONTIN) 100 MG capsule Take 1 capsule by mouth every morning and 1 capsule at bedtime. PATIENT NEEDS OFFICE VISIT FOR ADDITIONAL REFILLS Patient taking differently: Take 100 mg by mouth 2 (two) times daily as needed (pain). 05/25/13   Collene Leyden, PA-C  glucose blood test strip Use to test blood sugar daily. Dx code: 250.02. 11/06/12   Theda Sers, PA-C  Lancets MISC Use to test blood sugar daily. Dx code 26.02. 11/08/12   Collene Leyden, PA-C  lidocaine-prilocaine (EMLA) cream Apply 1 application topically Every Tuesday,Thursday,and Saturday with dialysis.  01/12/18   [provider]  multivitamin (RENA-VIT) TABS tablet Take 1 tablet by mouth daily.    [provider]  oxybutynin (DITROPAN) 5 MG tablet Take 1 tablet (5 mg total) by mouth 3 (three) times daily as needed for bladder spasms. 07/22/21   Vira Agar, MD  oxyCODONE (ROXICODONE) 5 MG immediate release  tablet Take 1 tablet (5 mg total) by mouth every 8 (eight) hours as needed. 09/04/21 09/04/22  Eugenie Filler, MD  VELPHORO 500 MG chewable tablet Chew 500 mg by mouth daily. 11/05/21   [provider]                                                                                                                                    Past Surgical History Past Surgical History:  Procedure Laterality Date   ABDOMINAL AORTOGRAM W/LOWER EXTREMITY N/A 01/30/2018   Procedure: ABDOMINAL AORTOGRAM W/LOWER EXTREMITY;  Surgeon:  Lorretta Harp, MD;  Location: [facility];  Service: Cardiovascular;  Laterality: N/A;   ABDOMINAL AORTOGRAM W/LOWER EXTREMITY N/A 03/03/2020   Procedure: ABDOMINAL AORTOGRAM W/LOWER EXTREMITY;  Surgeon: Lorretta Harp, MD;  Location: [facility];  Service: Cardiovascular;  Laterality: N/A;   ABDOMINAL AORTOGRAM W/LOWER EXTREMITY N/A 08/31/2021   Procedure: ABDOMINAL AORTOGRAM W/LOWER EXTREMITY;  Surgeon: Lorretta Harp, MD;  Location: [facility];  Service: Cardiovascular;  Laterality: N/A;   AMPUTATION Left 09/02/2021   Procedure: AMPUTATION GREAT TOE;  Surgeon: Felipa Furnace, DPM;  Location: [facility];  Service: Podiatry;  Laterality: Left;   APPENDECTOMY     BASCILIC VEIN TRANSPOSITION Right 10/03/2013   Procedure: BRACHIOCEPHALIC Arteriovenous Fistula ;  Surgeon: Mal Misty, MD;  Location: [facility];  Service: Vascular;  Laterality: Right;   [place] Right 11/17/2016   Procedure: BASCILIC VEIN TRANSPOSITION- RIGHT SINGLE STAGE;  Surgeon: Conrad [place], MD;  Location: [facility];  Service: Vascular;  Laterality: Right;   COLONOSCOPY     CYST EXCISION  1980's   cyst removed from lower abdomen   DILATION AND CURETTAGE OF UTERUS  1987   ESOPHAGOGASTRODUODENOSCOPY     PERIPHERAL VASCULAR BALLOON ANGIOPLASTY Right 01/30/2018   Procedure: PERIPHERAL VASCULAR BALLOON ANGIOPLASTY;  Surgeon: Lorretta Harp, MD;  Location: [facility];  Service: Cardiovascular;  Laterality: Right;  superficial femoral   PERIPHERAL VASCULAR BALLOON ANGIOPLASTY Left 08/31/2021   Procedure: PERIPHERAL VASCULAR BALLOON ANGIOPLASTY;  Surgeon: Lorretta Harp, MD;  Location: [facility];  Service: Cardiovascular;  Laterality: Left;   PERIPHERAL VASCULAR INTERVENTION  03/03/2020   Procedure: PERIPHERAL VASCULAR INTERVENTION;  Surgeon: Lorretta Harp, MD;  Location: [facility];  Service: Cardiovascular;;  external iliac stent left sfa lithotripsy/ drug coated balloon   SHOULDER ARTHROSCOPY W/ ROTATOR CUFF REPAIR Right 2013   TRANSURETHRAL RESECTION OF BLADDER TUMOR N/A 07/22/2021   Procedure: TRANSURETHRAL RESECTION OF BLADDER TUMOR (TURBT);  Surgeon: Vira Agar, MD;  Location: [facility];  Service: Urology;  Laterality: N/A;   TUBAL LIGATION  1986   UPPER EXTREMITY VENOGRAPHY Bilateral 11/08/2016   Procedure: Bilateral Upper Extremity Venography;  Surgeon: Conrad , MD;  Location: [facility];  Service: Cardiovascular;  Laterality: Bilateral;   Family History Family History  Problem Relation Age of Onset   Depression Mother    Hypertension Mother    Depression Father    Depression Sister    Hypertension Sister     Social History Social History   Tobacco Use   Smoking status: Former    Packs/day: 0.25    Years: 10.00    Total pack years: 2.50    Types: Cigarettes    Quit date: 1970    Years since quitting: 53.8   Smokeless tobacco: Never   Tobacco comments:    "never a heavy smoker; smoked off and on"  Vaping Use   Vaping Use: Never used  Substance Use Topics   Alcohol use: Not Currently    Comment: "in my younger days"   Drug use: Not Currently    Types: Marijuana    Comment: "weed in my teens"   Allergies Blood-group specific substance and Tylenol [acetaminophen]  Review of Systems Review of Systems  Neurological:  Positive for weakness and numbness.   \ Physical Exam Vital Signs   I have reviewed the triage vital signs BP (!) 103/48   Pulse 81  Temp 98 F (36.7 C) (Oral)   Resp 16   Ht 5' 4"  (1.626 m)   Wt 60.8 kg   SpO2 96%   BMI 23.00 kg/m   Physical Exam Vitals and nursing note reviewed.  Constitutional:      General: She is not in acute distress.    Appearance: She is well-developed.  HENT:     Head: Normocephalic and atraumatic.  Eyes:     Conjunctiva/sclera: Conjunctivae normal.  Cardiovascular:     Rate and Rhythm: Normal rate and regular rhythm.     Heart sounds: No murmur heard. Pulmonary:     Effort: Pulmonary effort is normal. No respiratory distress.     Breath sounds: Normal breath sounds.  Abdominal:     Palpations: Abdomen is soft.     Tenderness: There is no abdominal tenderness.  Musculoskeletal:        General: No swelling.     Cervical back: Neck supple.  Skin:    General: Skin is warm and dry.     Capillary Refill: Capillary refill takes less than 2 seconds.  Neurological:     Mental Status: She is alert.     Sensory: Sensory deficit present.  Psychiatric:        Mood and Affect: Mood normal.     ED Results and Treatments Labs (all labs ordered are listed, but only abnormal results are displayed) Labs Reviewed  COMPREHENSIVE METABOLIC PANEL - Abnormal; Notable for the following components:      Result Value   Chloride 95 (*)    BUN 38 (*)    Creatinine, Ser 6.56 (*)    Calcium 8.5 (*)    Albumin 2.6 (*)    GFR, Estimated 6 (*)    All other components within normal limits  CBC WITH DIFFERENTIAL/PLATELET - Abnormal; Notable for the following components:   RBC 5.60 (*)    Hemoglobin 16.8 (*)    HCT 54.0 (*)    RDW 17.4 (*)    All other components within normal limits  TROPONIN I (HIGH SENSITIVITY)  TROPONIN I (HIGH SENSITIVITY)                                                                                                                          Radiology MR BRAIN W WO CONTRAST  Result Date:  04/19/2022 CLINICAL DATA:  Brain/CNS neoplasm, staging EXAM: MRI HEAD WITHOUT AND WITH CONTRAST TECHNIQUE: Multiplanar, multiecho pulse sequences of the brain and surrounding structures were obtained without and with intravenous contrast. CONTRAST:  37m GADAVIST GADOBUTROL 1 MMOL/ML IV SOLN COMPARISON:  No prior MRI, correlation is made with CT head 04/19/2022 FINDINGS: Brain: No abnormal parenchymal or meningeal enhancement. The previously suspected possible meningioma is not definitively seen and may be an atypical arachnoid granulation. No restricted diffusion to suggest acute or subacute infarct. No acute hemorrhage, mass, mass effect, or midline shift. No hemosiderin deposition to suggest remote hemorrhage. No hydrocephalus or extra-axial collection. Confluent  and scattered T2 hyperintense signal in the periventricular white matter, likely the sequela of mild-to-moderate chronic small vessel ischemic disease. Remote lacunar infarcts in the bilateral cerebellar hemispheres. Vascular: Normal arterial flow voids. Normal arterial and venous enhancement. Skull and upper cervical spine: Heterogeneous and decreased marrow signal. No focal or enhancing lesion. Sinuses/Orbits: No acute finding. Other: The mastoids are well aerated. IMPRESSION: 1. No acute intracranial process. 2. The previously suspected possible meningioma is not definitively seen. 3. Heterogeneous and decreased marrow signal without focal lesion, which is nonspecific; although this can be caused by an infiltrative marrow process, the most common causes include anemia, obesity, or tobacco use. Electronically Signed   By: Merilyn Baba M.D.   On: 04/19/2022 19:02   MR Cervical Spine Wo Contrast  Result Date: 04/19/2022 CLINICAL DATA:  Cervical radiculopathy. EXAM: MRI CERVICAL SPINE WITHOUT CONTRAST TECHNIQUE: Multiplanar, multisequence MR imaging of the cervical spine was performed. No intravenous contrast was administered. COMPARISON:  None  Available. FINDINGS: Despite efforts by the technologist and patient, motion artifact is present on today's exam and could not be eliminated. This reduces exam sensitivity and specificity. Alignment: No sagittal spondylolisthesis. Vertebrae: Vertebral body heights are maintained. There is diffuse decreased T1 signal throughout the visualized bones, however this remains hyperintense to skeletal muscle and compatible with red marrow reconversion. A diffuse bone marrow infiltrative process can have a similar appearance, and recommend clinical correlation with serum labs. Minimal posterior C3-4 through C6-7 disc space narrowing. Mild C4-5 and minimal C5-6 edematous marrow endplate degenerative change. Moderate anterior C4-5 and C5-6 endplate osteophytes. No acute fracture or destructive bone lesion. The atlantodens interval is intact. Cord: Within the limitations of patient motion artifact, no definitive cervical cord signal abnormality is seen. There are scattered areas of punctate increased T2 signal that may be artifactual. Posterior Fossa, vertebral arteries, paraspinal tissues: Incidental note of likely remote infarct within the left cerebellar hemisphere. No cervical chain lymphadenopathy. The vertebral artery flow voids are maintained. Disc levels: C2-3: No posterior disc bulge, central canal narrowing, or neuroforaminal stenosis. C3-4: Moderate bilateral facet joint hypertrophy. Mild-to-moderate left-greater-than-right posterior disc osteophyte complex with left-greater-than-right intraforaminal extension. Severe left and mild right neuroforaminal stenosis. Mild narrowing of the lateral recesses and mild central canal stenosis. C4-5: Moderate to severe right and moderate left facet joint hypertrophy. Mild-to-moderate broad-based posterior disc osteophyte complex with bilateral intraforaminal extension. Moderate to severe right and moderate left neuroforaminal stenosis. Moderate narrowing of the lateral recesses  and moderate central canal stenosis. C5-6: Moderate to severe bilateral facet joint hypertrophy. Mild-to-moderate broad-based posterior disc osteophyte complex with bilateral intraforaminal extension. Mild-to-moderate narrowing of the lateral recesses and mild-to-moderate central canal stenosis. Mild left and borderline minimal right neuroforaminal stenosis. C6-7: Moderate bilateral facet joint hypertrophy. Mild broad-based posterior disc bulge with superimposed midline posterior disc protrusion mildly impresses on the ventral cord. Mild central canal stenosis. Borderline mild bilateral neuroforaminal stenosis. C7-T1: No posterior disc bulge, central canal narrowing, or neuroforaminal stenosis. Incidental note of left intraforaminal perineural cysts. T1-2: Mild right neuroforaminal narrowing secondary to uncovertebral and facet joint hypertrophy. T2-3: Seen on sagittal images only. Mild right neuroforaminal stenosis secondary to facet joint spurring. IMPRESSION: 1. Multilevel degenerative disc and joint changes as above. 2. Mild C3-4, moderate C4-5, mild-to-moderate C5-6, and mild C6-7 central canal stenosis. 3. Multilevel neuroforaminal stenosis including severe left C3-4 and moderate to severe right and moderate left C4-5 neuroforaminal stenosis. Electronically Signed   By: Yvonne Kendall M.D.   On: 04/19/2022 18:55  CT Head Wo Contrast  Result Date: 04/19/2022 CLINICAL DATA:  Left with mom and painful this morning. EXAM: CT HEAD WITHOUT CONTRAST TECHNIQUE: Contiguous axial images were obtained from the base of the skull through the vertex without intravenous contrast. RADIATION DOSE REDUCTION: This exam was performed according to the departmental dose-optimization program which includes automated exposure control, adjustment of the mA and/or kV according to patient size and/or use of iterative reconstruction technique. COMPARISON:  CT Brain 02/05/09 FINDINGS: Brain: No evidence of acute infarction, hemorrhage,  hydrocephalus, extra-axial collection or mass lesion/mass effect. There is a 6 x 5 mm hyperdense lesion along the superior aspect of the right transverse sinus (series 6, image 21). Sequela of chronic microvascular ischemic change. Vascular: No hyperdense vessel or unexpected calcification. Skull: Normal. Negative for fracture or focal lesion. Sinuses/Orbits: No acute finding. Other: None. IMPRESSION: 1. No acute intracranial abnormality. 2. Incidentally noted is 6 x 5 mm hyperdense lesion along the superior aspect of the right transverse sinus is favored to represent a small meningioma. This is new compared to 2010 and could be further evaluated with MRI with and without contrast. Electronically Signed   By: Marin Roberts M.D.   On: 04/19/2022 13:38    Pertinent labs & imaging results that were available during my care of the patient were reviewed by me and considered in my medical decision making (see MDM for details).  Medications Ordered in ED Medications  gadobutrol (GADAVIST) 1 MMOL/ML injection 6 mL (6 mLs Intravenous Contrast Given 04/19/22 1812)                                                                                                                                     Procedures Procedures  (including critical care time)  Medical Decision Making / ED Course   This patient presents to the ED for concern of numbness, weakness, this involves an extensive number of treatment options, and is a complaint that carries with it a high risk of complications and morbidity.  The differential diagnosis includes CVA, TIA, peripheral nerve compression, Saturday night palsy, electrolyte abnormality, neuropathy, cubital tunnel, carpal tunnel  MDM: Patient seen emergency room for evaluation of left upper extremity numbness.  Physical exam with some mild decreased two-point discrimination over the dorsal aspect of digits 3 and 4, mild symmetric bilateral upper extremity hand weakness with grip  strength but is otherwise unremarkable.  Patient is outside the window for stroke alert at this time and is not VAN (+), thus routine stroke work-up initiated.  Initial CT head with a possible new hemangioma along the right transverse sinus.  This is not entirely explain her symptoms and thus additional imaging with an MRI brain and MRI C-spine was ordered and is currently pending.  Laboratory evaluation also pending at time of signout.  Please see provider signout for continuation of work-up.   Additional history obtained: -Additional history obtained from daughter -External records  from outside source obtained and reviewed including: Chart review including previous notes, labs, imaging, consultation notes   Lab Tests: -I ordered, reviewed, and interpreted labs.   The pertinent results include:   Labs Reviewed  COMPREHENSIVE METABOLIC PANEL - Abnormal; Notable for the following components:      Result Value   Chloride 95 (*)    BUN 38 (*)    Creatinine, Ser 6.56 (*)    Calcium 8.5 (*)    Albumin 2.6 (*)    GFR, Estimated 6 (*)    All other components within normal limits  CBC WITH DIFFERENTIAL/PLATELET - Abnormal; Notable for the following components:   RBC 5.60 (*)    Hemoglobin 16.8 (*)    HCT 54.0 (*)    RDW 17.4 (*)    All other components within normal limits  TROPONIN I (HIGH SENSITIVITY)  TROPONIN I (HIGH SENSITIVITY)       Imaging Studies ordered: I ordered imaging studies including CT head I independently visualized and interpreted imaging. I agree with the radiologist interpretation  MRI brain and C-spine are pending   Medicines ordered and prescription drug management: Meds ordered this encounter  Medications   gadobutrol (GADAVIST) 1 MMOL/ML injection 6 mL    -I have reviewed the patients home medicines and have made adjustments as needed  Critical interventions none    Cardiac Monitoring: The patient was maintained on a cardiac monitor.  I  personally viewed and interpreted the cardiac monitored which showed an underlying rhythm of: NSR  Social Determinants of Health:  Factors impacting patients care include: none   Reevaluation: After the interventions noted above, I reevaluated the patient and found that they have :stayed the same  Co morbidities that complicate the patient evaluation  Past Medical History:  Diagnosis Date   Arthritis    "legs, knees; not bad" (01/30/2018)   Diabetic foot infection ([place]) 10/02/2021   Dizziness    occasionally   ESRD (end stage renal disease) ([place])    [name]; TTS" (01/30/2018)   History of colon polyps    History of gout    takes Allopurinol daily (01/30/2018)   Hyperlipidemia    takes Pravastatin daily   Hypertension    takes Monopril daily   Joint pain    PAD (peripheral artery disease) ([place])    Peripheral neuropathy    Peripheral vascular disease ([place])    Pneumonia    Pre-diabetes    Refusal of blood transfusions as patient is Jehovah's Witness    NO BLOOD OR BLOOD PRODUCTS. Albumin okay.    Stroke [facility]) 1990s   "mini stroke; left lower mouth a little bit twisted since" (01/30/2018)      Dispostion: I considered admission for this patient, ] and disposition pending completion of imaging.  Please see provider signout for continuation of work-up.     Final Clinical Impression(s) / ED Diagnoses Final diagnoses:  Cervical radiculopathy     @PCDICTATION @    Teressa Lower, MD 04/19/22 2124

## 2022-04-19 NOTE — Discharge Instructions (Addendum)
Like an appointment to follow-up with neurosurgery.  The numbness in the left hand fingers could be secondary to pinched nerves in your neck.  MRI raises some concerns for narrowing in that area.  Follow-up with dialysis as scheduled for tomorrow.  MRI brain without anything new.

## 2022-04-19 NOTE — ED Notes (Signed)
Patient transported to CT 

## 2022-05-07 ENCOUNTER — Encounter (HOSPITAL_COMMUNITY): Payer: Self-pay | Admitting: Nephrology

## 2022-05-12 ENCOUNTER — Ambulatory Visit
Admission: RE | Admit: 2022-05-12 | Discharge: 2022-05-12 | Disposition: A | Discharge status: Home / Self Care | Payer: Medicaid Other | Source: Ambulatory Visit | Attending: Internal Medicine | Admitting: Internal Medicine

## 2022-05-12 DIAGNOSIS — Z1231 Encounter for screening mammogram for malignant neoplasm of breast: Secondary | ICD-10-CM

## 2022-05-14 ENCOUNTER — Ambulatory Visit (INDEPENDENT_AMBULATORY_CARE_PROVIDER_SITE_OTHER): Payer: Medicare Other | Admitting: Podiatry

## 2022-05-14 ENCOUNTER — Encounter: Payer: Self-pay | Admitting: Podiatry

## 2022-05-14 DIAGNOSIS — Z89419 Acquired absence of unspecified great toe: Secondary | ICD-10-CM

## 2022-05-14 DIAGNOSIS — E1151 Type 2 diabetes mellitus with diabetic peripheral angiopathy without gangrene: Secondary | ICD-10-CM | POA: Diagnosis not present

## 2022-05-14 DIAGNOSIS — M79675 Pain in left toe(s): Secondary | ICD-10-CM

## 2022-05-14 DIAGNOSIS — M79674 Pain in right toe(s): Secondary | ICD-10-CM | POA: Diagnosis not present

## 2022-05-14 DIAGNOSIS — L84 Corns and callosities: Secondary | ICD-10-CM | POA: Diagnosis not present

## 2022-05-14 DIAGNOSIS — B351 Tinea unguium: Secondary | ICD-10-CM | POA: Diagnosis not present

## 2022-05-14 DIAGNOSIS — E119 Type 2 diabetes mellitus without complications: Secondary | ICD-10-CM

## 2022-05-14 NOTE — Progress Notes (Unsigned)
ANNUAL DIABETIC FOOT EXAM  Subjective: Joanne Carlson presents today {jgcomplaint:23593}.  Chief Complaint  Patient presents with   Nail Problem    Thick painful toenails, 3 month follow up    Patient confirms h/o diabetes.  Patient relates {Numbers; 0-100:15068} year h/o diabetes.  Patient denies any h/o foot wounds.  Patient has h/o foot ulcer of {jgPodToeLocator:23637}, which healed via help of ***.  Patient admits symptoms of foot numbness.   Patient admits symptoms of foot tingling.  Patient admits symptoms of burning in feet.  Patient admits symptoms of pins/needles sensation in feet.  Patient denies any numbness, tingling, burning, or pins/needle sensation in feet.  Patient has been diagnosed with neuropathy and it is managed with {JGNEUROPATHYMEDS:27053}.  Patient's blood sugar was *** mg/dl {Time; today/yesterday/ 2 days ago:19188}. Last known  HgA1c was ***%   Patient did not check blood glucose this morning.  Patient does not monitor blood glucose daily.  Risk factors: {jgriskfactors:24044}.  Vernie Shanks, MD (Inactive) is patient's PCP. Last visit was {Time; dates multiple:15870}***.  Past Medical History:  Diagnosis Date   Arthritis    "legs, knees; not bad" (01/30/2018)   Diabetic foot infection ([place]) 10/02/2021   Dizziness    occasionally   ESRD (end stage renal disease) ([place])    [name]; TTS" (01/30/2018)   History of colon polyps    History of gout    takes Allopurinol daily (01/30/2018)   Hyperlipidemia    takes Pravastatin daily   Hypertension    takes Monopril daily   Joint pain    PAD (peripheral artery disease) ([place])    Peripheral neuropathy    Peripheral vascular disease ([place])    Pneumonia    Pre-diabetes    Refusal of blood transfusions as patient is Jehov[employer]'s Witness    NO BLOOD OR BLOOD PRODUCTS. Albumin okay.    Stroke [facility]) 1990s   "mini stroke; left lower mouth a little bit twisted since" (01/30/2018)   Patient  Active Problem List   Diagnosis Date Noted   Diabetic foot infection ([place]) 10/02/2021   Cellulitis    Medication monitoring encounter    Osteomyelitis ([place]) 08/29/2021   PVD (peripheral vascular disease) ([place]) 08/29/2021   Bladder cancer ([place]) 07/22/2021   Aortic stenosis 04/08/2021   Lumbar radiculopathy 10/15/2020   Lumbar pain 10/15/2020   Hypercalcemia 04/25/2020   Claudication in peripheral vascular disease ([place]) 03/03/2020   Closed fracture of base of proximal phalanx of finger 01/23/2020   Right wrist pain 01/09/2020   Pain of left hand 12/24/2019   Allergy, unspecified, initial encounter 02/05/2019   Anaphylactic shock, unspecified, initial encounter 02/05/2019   Encounter for removal of sutures 07/15/2018   Iron deficiency anemia, unspecified 02/27/2018   Atherosclerosis of native arteries of the extremities with gangrene ([place]) 11/23/2017   Critical lower limb ischemia ([place]) 01/07/2017   Shortness of breath 12/04/2016   Unspecified protein-calorie malnutrition ([place]) 12/02/2016   Encounter for immunization 11/22/2016   Dependence on renal dialysis ([place]) 11/16/2016   Coagulation defect, unspecified ([place]) 10/19/2016   Dorsalgia, unspecified 10/12/2016   Gout, unspecified 10/12/2016   Hyperkalemia 10/12/2016   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 stage renal disease ([place]) 10/12/2016   Morbid (severe) obesity due to excess calories ([place]) 10/12/2016   Secondary hyperparathyroidism of renal origin ([place]) 10/12/2016   Chronic renal insufficiency, stage IV (severe) ([place]) 11/13/2013   End stage renal disease ([place]) 07/20/2013   Mechanical complication of  other vascular device, implant, and graft 07/20/2013   Other specified disorders of kidney and ureter 11/30/2011   History of recurrent transient ischemic attacks 09/27/2011   HYPERCHOLESTEROLEMIA 08/07/2008   ANEMIA 08/07/2008   RENAL INSUFFICIENCY 08/07/2008   ALLERGIC RHINITIS  03/04/2008   Type 2 diabetes mellitus without complication, with long-term current use of insulin ([place]) 03/15/2007   HYPERTENSION, BENIGN 03/15/2007   Past Surgical History:  Procedure Laterality Date   ABDOMINAL AORTOGRAM W/LOWER EXTREMITY N/A 01/30/2018   Procedure: ABDOMINAL AORTOGRAM W/LOWER EXTREMITY;  Surgeon: Lorretta Harp, MD;  Location: [place] [facility];  Service: Cardiovascular;  Laterality: N/A;   ABDOMINAL AORTOGRAM W/LOWER EXTREMITY N/A 03/03/2020   Procedure: ABDOMINAL AORTOGRAM W/LOWER EXTREMITY;  Surgeon: Lorretta Harp, MD;  Location: [place] [facility];  Service: Cardiovascular;  Laterality: N/A;   ABDOMINAL AORTOGRAM W/LOWER EXTREMITY N/A 08/31/2021   Procedure: ABDOMINAL AORTOGRAM W/LOWER EXTREMITY;  Surgeon: Lorretta Harp, MD;  Location: [place] [facility];  Service: Cardiovascular;  Laterality: N/A;   AMPUTATION Left 09/02/2021   Procedure: AMPUTATION GREAT TOE;  Surgeon: Felipa Furnace, DPM;  Location: [place];  Service: Podiatry;  Laterality: Left;   APPENDECTOMY     BASCILIC VEIN TRANSPOSITION Right 10/03/2013   Procedure: BRACHIOCEPHALIC Arteriovenous Fistula ;  Surgeon: Mal Misty, MD;  Location: [facility];  Service: Vascular;  Laterality: Right;   [place] Right 11/17/2016   Procedure: BASCILIC VEIN TRANSPOSITION- RIGHT SINGLE STAGE;  Surgeon: Conrad [place], MD;  Location: [place];  Service: Vascular;  Laterality: Right;   COLONOSCOPY     CYST EXCISION  1980's   cyst removed from lower abdomen   DILATION AND CURETTAGE OF UTERUS  1987   ESOPHAGOGASTRODUODENOSCOPY     PERIPHERAL VASCULAR BALLOON ANGIOPLASTY Right 01/30/2018   Procedure: PERIPHERAL VASCULAR BALLOON ANGIOPLASTY;  Surgeon: Lorretta Harp, MD;  Location: [place] [facility];  Service: Cardiovascular;  Laterality: Right;  superficial femoral   PERIPHERAL VASCULAR BALLOON ANGIOPLASTY Left 08/31/2021   Procedure: PERIPHERAL VASCULAR BALLOON ANGIOPLASTY;  Surgeon: Lorretta Harp, MD;  Location: [place] [facility];  Service: Cardiovascular;  Laterality: Left;   PERIPHERAL VASCULAR INTERVENTION  03/03/2020   Procedure: PERIPHERAL VASCULAR INTERVENTION;  Surgeon: Lorretta Harp, MD;  Location: [place] [facility];  Service: Cardiovascular;;  external iliac stent left sfa lithotripsy/ drug coated balloon   SHOULDER ARTHROSCOPY W/ ROTATOR CUFF REPAIR Right 2013   TRANSURETHRAL RESECTION OF BLADDER TUMOR N/A 07/22/2021   Procedure: TRANSURETHRAL RESECTION OF BLADDER TUMOR (TURBT);  Surgeon: Vira Agar, MD;  Location: [facility];  Service: Urology;  Laterality: N/A;   TUBAL LIGATION  1986   UPPER EXTREMITY VENOGRAPHY Bilateral 11/08/2016   Procedure: Bilateral Upper Extremity Venography;  Surgeon: Conrad [place], MD;  Location: [facility];  Service: Cardiovascular;  Laterality: Bilateral;   Current Outpatient Medications on File Prior to Visit  Medication Sig Dispense Refill   acetaminophen (TYLENOL) 325 MG tablet Take 2 tablets (650 mg total) by mouth every 4 (four) hours as needed for headache or mild pain.     allopurinol (ZYLOPRIM) 100 MG tablet Take 100 mg by mouth daily.     aspirin EC 81 MG tablet Take 81 mg by mouth 4 (four) times a week.     atorvastatin (LIPITOR) 80 MG tablet TAKE 1 TABLET BY MOUTH ONCE DAILY AT  6PM 90 tablet 1   Blood Glucose Monitoring Suppl (ACCU-CHEK AVIVA PLUS) w/Device KIT  0   calcium acetate (PHOSLO) 667 MG capsule Take 667 mg by mouth 2 (two) times daily with a meal.     cinacalcet (SENSIPAR) 60 MG tablet Take 60 mg by mouth every Monday, Wednesday, and Friday.     clopidogrel (PLAVIX) 75 MG tablet Take 1 tablet by mouth once daily 90 tablet 1   gabapentin (NEURONTIN) 100 MG capsule Take 1 capsule by mouth every morning and 1 capsule at bedtime. PATIENT NEEDS OFFICE VISIT FOR ADDITIONAL REFILLS (Patient taking differently: Take 100 mg by mouth 2 (two) times daily as needed (pain).) 60 capsule 0   glucose blood test strip Use  to test blood sugar daily. Dx code: 250.02. 100 each 3   Lancets MISC Use to test blood sugar daily. Dx code 250.02. 100 each 3   lidocaine-prilocaine (EMLA) cream Apply 1 application topically Every Tuesday,Thursday,and Saturday with dialysis.   12   multivitamin (RENA-VIT) TABS tablet Take 1 tablet by mouth daily.     oxybutynin (DITROPAN) 5 MG tablet Take 1 tablet (5 mg total) by mouth 3 (three) times daily as needed for bladder spasms. 20 tablet 1   oxyCODONE (ROXICODONE) 5 MG immediate release tablet Take 1 tablet (5 mg total) by mouth every 8 (eight) hours as needed. 15 tablet 0   VELPHORO 500 MG chewable tablet Chew 500 mg by mouth daily.     No current facility-administered medications on file prior to visit.    Allergies  Allergen Reactions   Blood-Group Specific Substance    Tylenol [Acetaminophen] Rash   Social History   Occupational History   Not on file  Tobacco Use   Smoking status: Former    Packs/day: 0.25    Years: 10.00    Total pack years: 2.50    Types: Cigarettes    Quit date: 1970    Years since quitting: 53.8   Smokeless tobacco: Never   Tobacco comments:    "never a heavy smoker; smoked off and on"  Vaping Use   Vaping Use: Never used  Substance and Sexual Activity   Alcohol use: Not Currently    Comment: "in my younger days"   Drug use: Not Currently    Types: Marijuana    Comment: "weed in my teens"   Sexual activity: Not Currently    Birth control/protection: Post-menopausal   Family History  Problem Relation Age of Onset   Depression Mother    Hypertension Mother    Depression Father    Depression Sister    Hypertension Sister    Immunization History  Administered Date(s) Administered   Hepatitis B, adult 12/02/2016, 12/30/2016, 01/29/2017, 06/30/2017, 12/22/2017, 01/19/2018, 02/25/2018, 06/22/2018   Hepb-cpg 08/21/2020, 09/18/2020, 10/23/2020, 12/23/2020   Influenza Whole 04/27/2007, 07/22/2008, 04/04/2012   Influenza, High Dose  Seasonal PF 04/03/2019   Influenza,inj,quad, With Preservative 07/17/2019   Moderna Sars-Covid-2 Vaccination 01/01/2021   PFIZER(Purple Top)SARS-COV-2 Vaccination 10/09/2019, 11/06/2019, 03/22/2020   PPD Test 01/25/2012   Pneumococcal Conjugate-13 08/08/2018   Pneumococcal Polysaccharide-23 04/27/2007, 03/04/2008, 08/04/2017     Review of Systems: Negative except as noted in the HPI.   Objective: There were no vitals filed for this visit.  Joanne Carlson is a pleasant 76 y.o. female in NAD. AAO X 3.  Vascular Examination: {jgvascular:23595}  Dermatological Examination: {jgderm:23598}  Neurological Examination: {jgneuro:23601::"Protective sensation intact 5/5 intact bilaterally with 10g monofilament b/l.","Vibratory sensation intact b/l.","Proprioception intact bilaterally."}  Musculoskeletal Examination: {jgmsk:23600}  Footwear Assessment: Does the patient wear appropriate shoes? {Yes,No}. Does the patient  need inserts/orthotics? {Yes,No}.  ADA Risk Categorization: Low Risk :  Patient has all of the following: Intact protective sensation No prior foot ulcer  No severe deformity Pedal pulses present  High Risk  Patient has one or more of the following: Loss of protective sensation Absent pedal pulses Severe Foot deformity History of foot ulcer  Assessment: No diagnosis found.   Plan: {jgplan:23602::"-Patient/POA to call should there be question/concern in the interim."} Return in about 3 months (around 08/14/2022).  Marzetta Board, DPM

## 2022-06-06 ENCOUNTER — Inpatient Hospital Stay (HOSPITAL_COMMUNITY)
Admission: EM | Admit: 2022-06-06 | Discharge: 2022-06-11 | DRG: 175 | Disposition: A | Discharge status: Home / Self Care | Payer: Medicare Other | Attending: Family Medicine | Admitting: Family Medicine

## 2022-06-06 ENCOUNTER — Encounter (HOSPITAL_COMMUNITY): Payer: Self-pay

## 2022-06-06 DIAGNOSIS — R410 Disorientation, unspecified: Secondary | ICD-10-CM | POA: Diagnosis present

## 2022-06-06 DIAGNOSIS — Z7902 Long term (current) use of antithrombotics/antiplatelets: Secondary | ICD-10-CM

## 2022-06-06 DIAGNOSIS — C7801 Secondary malignant neoplasm of right lung: Secondary | ICD-10-CM | POA: Diagnosis present

## 2022-06-06 DIAGNOSIS — D649 Anemia, unspecified: Secondary | ICD-10-CM | POA: Diagnosis present

## 2022-06-06 DIAGNOSIS — Z89412 Acquired absence of left great toe: Secondary | ICD-10-CM

## 2022-06-06 DIAGNOSIS — Z8249 Family history of ischemic heart disease and other diseases of the circulatory system: Secondary | ICD-10-CM

## 2022-06-06 DIAGNOSIS — C7802 Secondary malignant neoplasm of left lung: Secondary | ICD-10-CM | POA: Diagnosis present

## 2022-06-06 DIAGNOSIS — Z7982 Long term (current) use of aspirin: Secondary | ICD-10-CM

## 2022-06-06 DIAGNOSIS — Z66 Do not resuscitate: Secondary | ICD-10-CM | POA: Diagnosis not present

## 2022-06-06 DIAGNOSIS — Z8619 Personal history of other infectious and parasitic diseases: Secondary | ICD-10-CM

## 2022-06-06 DIAGNOSIS — Z906 Acquired absence of other parts of urinary tract: Secondary | ICD-10-CM

## 2022-06-06 DIAGNOSIS — E875 Hyperkalemia: Secondary | ICD-10-CM | POA: Diagnosis present

## 2022-06-06 DIAGNOSIS — Z8719 Personal history of other diseases of the digestive system: Secondary | ICD-10-CM

## 2022-06-06 DIAGNOSIS — Z8551 Personal history of malignant neoplasm of bladder: Secondary | ICD-10-CM

## 2022-06-06 DIAGNOSIS — I9589 Other hypotension: Secondary | ICD-10-CM | POA: Diagnosis present

## 2022-06-06 DIAGNOSIS — Z818 Family history of other mental and behavioral disorders: Secondary | ICD-10-CM

## 2022-06-06 DIAGNOSIS — M109 Gout, unspecified: Secondary | ICD-10-CM | POA: Diagnosis present

## 2022-06-06 DIAGNOSIS — Z86010 Personal history of colonic polyps: Secondary | ICD-10-CM

## 2022-06-06 DIAGNOSIS — D72829 Elevated white blood cell count, unspecified: Secondary | ICD-10-CM | POA: Diagnosis present

## 2022-06-06 DIAGNOSIS — I12 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 stage renal disease: Secondary | ICD-10-CM | POA: Diagnosis present

## 2022-06-06 DIAGNOSIS — E785 Hyperlipidemia, unspecified: Secondary | ICD-10-CM | POA: Diagnosis present

## 2022-06-06 DIAGNOSIS — N186 End stage renal disease: Secondary | ICD-10-CM | POA: Diagnosis present

## 2022-06-06 DIAGNOSIS — C678 Malignant neoplasm of overlapping sites of bladder: Secondary | ICD-10-CM | POA: Diagnosis present

## 2022-06-06 DIAGNOSIS — Z8673 Personal history of transient ischemic attack (TIA), and cerebral infarction without residual deficits: Secondary | ICD-10-CM

## 2022-06-06 DIAGNOSIS — Z79899 Other long term (current) drug therapy: Secondary | ICD-10-CM

## 2022-06-06 DIAGNOSIS — Z9851 Tubal ligation status: Secondary | ICD-10-CM

## 2022-06-06 DIAGNOSIS — I82409 Acute embolism and thrombosis of unspecified deep veins of unspecified lower extremity: Secondary | ICD-10-CM | POA: Diagnosis present

## 2022-06-06 DIAGNOSIS — D631 Anemia in chronic kidney disease: Secondary | ICD-10-CM | POA: Diagnosis present

## 2022-06-06 DIAGNOSIS — E1142 Type 2 diabetes mellitus with diabetic polyneuropathy: Secondary | ICD-10-CM | POA: Diagnosis present

## 2022-06-06 DIAGNOSIS — I959 Hypotension, unspecified: Secondary | ICD-10-CM | POA: Diagnosis present

## 2022-06-06 DIAGNOSIS — C787 Secondary malignant neoplasm of liver and intrahepatic bile duct: Secondary | ICD-10-CM | POA: Diagnosis present

## 2022-06-06 DIAGNOSIS — E1122 Type 2 diabetes mellitus with diabetic chronic kidney disease: Secondary | ICD-10-CM | POA: Diagnosis present

## 2022-06-06 DIAGNOSIS — Z8701 Personal history of pneumonia (recurrent): Secondary | ICD-10-CM

## 2022-06-06 DIAGNOSIS — N2581 Secondary hyperparathyroidism of renal origin: Secondary | ICD-10-CM | POA: Diagnosis present

## 2022-06-06 DIAGNOSIS — Z515 Encounter for palliative care: Secondary | ICD-10-CM

## 2022-06-06 DIAGNOSIS — J811 Chronic pulmonary edema: Secondary | ICD-10-CM | POA: Diagnosis present

## 2022-06-06 DIAGNOSIS — I739 Peripheral vascular disease, unspecified: Secondary | ICD-10-CM | POA: Diagnosis present

## 2022-06-06 DIAGNOSIS — M170 Bilateral primary osteoarthritis of knee: Secondary | ICD-10-CM | POA: Diagnosis present

## 2022-06-06 DIAGNOSIS — R918 Other nonspecific abnormal finding of lung field: Secondary | ICD-10-CM | POA: Diagnosis present

## 2022-06-06 DIAGNOSIS — Z888 Allergy status to other drugs, medicaments and biological substances status: Secondary | ICD-10-CM

## 2022-06-06 DIAGNOSIS — C779 Secondary and unspecified malignant neoplasm of lymph node, unspecified: Secondary | ICD-10-CM | POA: Diagnosis present

## 2022-06-06 DIAGNOSIS — Z886 Allergy status to analgesic agent status: Secondary | ICD-10-CM

## 2022-06-06 DIAGNOSIS — E1151 Type 2 diabetes mellitus with diabetic peripheral angiopathy without gangrene: Secondary | ICD-10-CM | POA: Diagnosis present

## 2022-06-06 DIAGNOSIS — Z992 Dependence on renal dialysis: Secondary | ICD-10-CM

## 2022-06-06 DIAGNOSIS — D6869 Other thrombophilia: Secondary | ICD-10-CM | POA: Diagnosis present

## 2022-06-06 DIAGNOSIS — C679 Malignant neoplasm of bladder, unspecified: Secondary | ICD-10-CM | POA: Diagnosis present

## 2022-06-06 DIAGNOSIS — M898X9 Other specified disorders of bone, unspecified site: Secondary | ICD-10-CM | POA: Diagnosis present

## 2022-06-06 DIAGNOSIS — I2699 Other pulmonary embolism without acute cor pulmonale: Principal | ICD-10-CM | POA: Diagnosis present

## 2022-06-06 DIAGNOSIS — Z87891 Personal history of nicotine dependence: Secondary | ICD-10-CM

## 2022-06-06 DIAGNOSIS — Z95820 Peripheral vascular angioplasty status with implants and grafts: Secondary | ICD-10-CM

## 2022-06-06 DIAGNOSIS — I82411 Acute embolism and thrombosis of right femoral vein: Secondary | ICD-10-CM | POA: Diagnosis present

## 2022-06-06 NOTE — ED Provider Triage Note (Signed)
Emergency Medicine Provider Triage Evaluation Note  CYRIL RAILEY , a 76 y.o. female  was evaluated in triage.  Pt complains of pain under her L breast which began tonight around 2100. Pain worse when bending forward and breathing. No medications taken PTA, but did take her prescribed nightly meds. Denies trauma, injury, strenuous activity. No diaphoresis, fever, N/V. Denies hx of ACS. Last dialyzed Saturday for a full session.  Review of Systems  Positive: As above Negative: As above  Physical Exam  There were no vitals taken for this visit. Gen:   Awake, no distress   Resp:  Normal effort. TTP under L breast w/o crepitus. MSK:   Moves extremities without difficulty  Other:  No pitting edema noted.  Medical Decision Making  Medically screening exam initiated at 11:54 PM.  Appropriate orders placed.  Khaliah Barnick Detloff was informed that the remainder of the evaluation will be completed by another provider, this initial triage assessment does not replace that evaluation, and the importance of remaining in the ED until their evaluation is complete.  Nonspecific chest pain - work up initiated.   Antonietta Breach, PA-C 06/07/22 0000

## 2022-06-06 NOTE — ED Triage Notes (Signed)
Pt comes via [facility] from home for chest wall pain that started a few hours ago, Originally hypotensive with EMS at 86/40  PTA received 250 fluid, pt is a dialysis pt, last received treatment yesterday.

## 2022-06-07 ENCOUNTER — Observation Stay (HOSPITAL_BASED_OUTPATIENT_CLINIC_OR_DEPARTMENT_OTHER): Payer: Medicare Other

## 2022-06-07 ENCOUNTER — Emergency Department (HOSPITAL_COMMUNITY): Payer: Medicare Other

## 2022-06-07 ENCOUNTER — Other Ambulatory Visit: Payer: Self-pay

## 2022-06-07 DIAGNOSIS — I739 Peripheral vascular disease, unspecified: Secondary | ICD-10-CM

## 2022-06-07 DIAGNOSIS — I2699 Other pulmonary embolism without acute cor pulmonale: Secondary | ICD-10-CM

## 2022-06-07 DIAGNOSIS — Z7189 Other specified counseling: Secondary | ICD-10-CM | POA: Diagnosis not present

## 2022-06-07 DIAGNOSIS — C679 Malignant neoplasm of bladder, unspecified: Secondary | ICD-10-CM | POA: Diagnosis not present

## 2022-06-07 DIAGNOSIS — I82409 Acute embolism and thrombosis of unspecified deep veins of unspecified lower extremity: Secondary | ICD-10-CM | POA: Diagnosis present

## 2022-06-07 DIAGNOSIS — N186 End stage renal disease: Secondary | ICD-10-CM | POA: Diagnosis not present

## 2022-06-07 DIAGNOSIS — D72829 Elevated white blood cell count, unspecified: Secondary | ICD-10-CM | POA: Diagnosis not present

## 2022-06-07 DIAGNOSIS — E875 Hyperkalemia: Secondary | ICD-10-CM | POA: Diagnosis not present

## 2022-06-07 DIAGNOSIS — I824Y1 Acute embolism and thrombosis of unspecified deep veins of right proximal lower extremity: Secondary | ICD-10-CM

## 2022-06-07 DIAGNOSIS — D649 Anemia, unspecified: Secondary | ICD-10-CM

## 2022-06-07 DIAGNOSIS — I361 Nonrheumatic tricuspid (valve) insufficiency: Secondary | ICD-10-CM

## 2022-06-07 DIAGNOSIS — I959 Hypotension, unspecified: Secondary | ICD-10-CM | POA: Diagnosis present

## 2022-06-07 LAB — ECHOCARDIOGRAM COMPLETE
AR max vel: 1.23 cm2
AV Area VTI: 1.21 cm2
AV Area mean vel: 1.22 cm2
AV Mean grad: 13.5 mmHg
AV Peak grad: 25.2 mmHg
Ao pk vel: 2.51 m/s
Area-P 1/2: 3.97 cm2
Height: 64 in
S' Lateral: 2.5 cm
Weight: 2080 oz

## 2022-06-07 LAB — CBC
HCT: 38.2 % (ref 36.0–46.0)
Hemoglobin: 11.8 g/dL — ABNORMAL LOW (ref 12.0–15.0)
MCH: 28 pg (ref 26.0–34.0)
MCHC: 30.9 g/dL (ref 30.0–36.0)
MCV: 90.5 fL (ref 80.0–100.0)
Platelets: 426 10*3/uL — ABNORMAL HIGH (ref 150–400)
RBC: 4.22 MIL/uL (ref 3.87–5.11)
RDW: 17.1 % — ABNORMAL HIGH (ref 11.5–15.5)
WBC: 18.5 10*3/uL — ABNORMAL HIGH (ref 4.0–10.5)
nRBC: 0 % (ref 0.0–0.2)

## 2022-06-07 LAB — BASIC METABOLIC PANEL
Anion gap: 15 (ref 5–15)
BUN: 43 mg/dL — ABNORMAL HIGH (ref 8–23)
CO2: 29 mmol/L (ref 22–32)
Calcium: 8.5 mg/dL — ABNORMAL LOW (ref 8.9–10.3)
Chloride: 91 mmol/L — ABNORMAL LOW (ref 98–111)
Creatinine, Ser: 5.62 mg/dL — ABNORMAL HIGH (ref 0.44–1.00)
GFR, Estimated: 7 mL/min — ABNORMAL LOW (ref >60)
Glucose, Bld: 138 mg/dL — ABNORMAL HIGH (ref 70–99)
Potassium: 5.6 mmol/L — ABNORMAL HIGH (ref 3.5–5.1)
Sodium: 135 mmol/L (ref 135–145)

## 2022-06-07 LAB — HEPARIN LEVEL (UNFRACTIONATED): Heparin Unfractionated: <0.1 IU/mL — ABNORMAL LOW (ref 0.30–0.70)

## 2022-06-07 LAB — TROPONIN I (HIGH SENSITIVITY)
Troponin I (High Sensitivity): 9 ng/L (ref <18)
Troponin I (High Sensitivity): 13 ng/L (ref <18)

## 2022-06-07 LAB — HEPATITIS B SURFACE ANTIGEN: Hepatitis B Surface Ag: NONREACTIVE

## 2022-06-07 MED ORDER — ATORVASTATIN CALCIUM 80 MG PO TABS
80.0000 mg | ORAL_TABLET | Freq: Every evening | ORAL | Status: DC
  Administered 2022-06-09: 80 mg via ORAL
  Filled 2022-06-07: qty 1

## 2022-06-07 MED ORDER — HEPARIN BOLUS VIA INFUSION
2000.0000 [IU] | Freq: Once | INTRAVENOUS | Status: AC
  Administered 2022-06-07: 2000 [IU] via INTRAVENOUS
  Filled 2022-06-07: qty 2000

## 2022-06-07 MED ORDER — SODIUM ZIRCONIUM CYCLOSILICATE 10 G PO PACK: 10.0000 g | PACK | Freq: Once | ORAL | Status: DC

## 2022-06-07 MED ORDER — ALBUTEROL SULFATE (2.5 MG/3ML) 0.083% IN NEBU: 2.5000 mg | INHALATION_SOLUTION | Freq: Four times a day (QID) | RESPIRATORY_TRACT | Status: DC | PRN

## 2022-06-07 MED ORDER — OXYBUTYNIN CHLORIDE 5 MG PO TABS
5.0000 mg | ORAL_TABLET | Freq: Three times a day (TID) | ORAL | Status: DC | PRN
  Filled 2022-06-07: qty 1

## 2022-06-07 MED ORDER — LIDOCAINE-PRILOCAINE 2.5-2.5 % EX CREA
1.0000 | TOPICAL_CREAM | CUTANEOUS | Status: DC
  Filled 2022-06-07: qty 5

## 2022-06-07 MED ORDER — HEPARIN (PORCINE) 25000 UT/250ML-% IV SOLN
1600.0000 [IU]/h | INTRAVENOUS | Status: DC
  Administered 2022-06-07: 950 [IU]/h via INTRAVENOUS
  Administered 2022-06-08: 1350 [IU]/h via INTRAVENOUS
  Administered 2022-06-09: 1500 [IU]/h via INTRAVENOUS
  Administered 2022-06-09: 1550 [IU]/h via INTRAVENOUS
  Filled 2022-06-07 (×4): qty 250

## 2022-06-07 MED ORDER — OXYCODONE-ACETAMINOPHEN 5-325 MG PO TABS: 1.0000 | ORAL_TABLET | ORAL | Status: DC | PRN

## 2022-06-07 MED ORDER — SODIUM CHLORIDE 0.9% FLUSH
3.0000 mL | Freq: Two times a day (BID) | INTRAVENOUS | Status: DC
  Administered 2022-06-07 – 2022-06-10 (×3): 3 mL via INTRAVENOUS

## 2022-06-07 MED ORDER — GABAPENTIN 100 MG PO CAPS
100.0000 mg | ORAL_CAPSULE | Freq: Two times a day (BID) | ORAL | Status: DC | PRN
  Administered 2022-06-08: 100 mg via ORAL

## 2022-06-07 MED ORDER — SUCROFERRIC OXYHYDROXIDE 500 MG PO CHEW
1000.0000 mg | CHEWABLE_TABLET | Freq: Three times a day (TID) | ORAL | Status: DC
  Administered 2022-06-08 – 2022-06-11 (×5): 1000 mg via ORAL
  Filled 2022-06-07 (×13): qty 2

## 2022-06-07 MED ORDER — CINACALCET HCL 30 MG PO TABS
30.0000 mg | ORAL_TABLET | Freq: Every day | ORAL | Status: DC
  Administered 2022-06-08 – 2022-06-09 (×2): 30 mg via ORAL
  Filled 2022-06-07 (×4): qty 1

## 2022-06-07 MED ORDER — RENA-VITE PO TABS
1.0000 | ORAL_TABLET | Freq: Every day | ORAL | Status: DC
  Administered 2022-06-08 – 2022-06-11 (×3): 1 via ORAL
  Filled 2022-06-07 (×4): qty 1

## 2022-06-07 MED ORDER — LACTATED RINGERS IV BOLUS
250.0000 mL | Freq: Once | INTRAVENOUS | Status: AC
  Administered 2022-06-07: 250 mL via INTRAVENOUS

## 2022-06-07 MED ORDER — MORPHINE SULFATE (PF) 2 MG/ML IV SOLN: 2.0000 mg | INTRAVENOUS | Status: DC | PRN

## 2022-06-07 MED ORDER — IOHEXOL 350 MG/ML SOLN
75.0000 mL | Freq: Once | INTRAVENOUS | Status: AC | PRN
  Administered 2022-06-07: 75 mL via INTRAVENOUS

## 2022-06-07 MED ORDER — OXYCODONE HCL 5 MG PO TABS
5.0000 mg | ORAL_TABLET | ORAL | Status: DC | PRN
  Administered 2022-06-08 – 2022-06-11 (×4): 5 mg via ORAL
  Filled 2022-06-07 (×4): qty 1

## 2022-06-07 NOTE — H&P (Signed)
History and Physical    Patient: Joanne Carlson TCN:[id number] DOB: Jun 16, 1946 DOA: 06/06/2022 DOS: the patient was seen and examined on 06/07/2022 PCP: Vernie Shanks, MD (Inactive)  Patient coming from: Home(son present in the home) via EMS  Chief Complaint:  Chief Complaint  Patient presents with   Chest Pain   HPI: Joanne Carlson is a 76 y.o. female with medical history significant of hypertension, hyperlipidemia, ESRD on HD, PVD, diabetes mellitus type 2, and metastatic bladder cancer s/p TURBTwho presents with complaints of left side pain.  History is obtained with the assistance of her daughter present at bedside.  Pain was sharp and waxes and wanes in intensity, but worse with taking a deep inspiratory breath.  She last dialyzed 2 days ago.  She has not had any recent fever, cough, nausea, vomiting, abdominal pain, diarrhea, or dysuria symptoms.  The patient has lost a considerable amount of weight.  They had been seen by palliative care back in September, but they were not regularly following the patient.  Daughter reports that they saw oncology once but otherwise had been following with Dr. Tresa Moore of urology and had not really been given any treatment options.  In route with EMS patient's initial blood pressure noted to be 86/40.  Blood pressures improved after receiving 250 mL of IV fluids prior to arrival.  Upon admission into the emergency department patient was noted to be afebrile with blood pressures as low as 87/50, and all other vital signs maintained.  Labs significant for WBC 18.5, platelets 426, potassium 5.6, BUN 43, creatinine 5.62, and high-sensitivity troponins negative x 2.  CT scan of the chest noted acute PE in the right lower lobe along with RV/LV ratio greater than 1 suggestive of right heart strain with multiple bilateral pulmonary nodules which appear either new and/or enlarged from prior CT and interval development of innumerable hypoenhancing liver lesions  concerning for worsening metastatic disease.  Nephrology hadbeen consulted for the need of dialysis.  Review of Systems: unable to review all systems due to the inability of the patient to answer questions. Past Medical History:  Diagnosis Date   Arthritis    "legs, knees; not bad" (01/30/2018)   Diabetic foot infection ([place]) 10/02/2021   Dizziness    occasionally   ESRD (end stage renal disease) ([place])    [name]; TTS" (01/30/2018)   History of colon polyps    History of gout    takes Allopurinol daily (01/30/2018)   Hyperlipidemia    takes Pravastatin daily   Hypertension    takes Monopril daily   Joint pain    PAD (peripheral artery disease) ([place])    Peripheral neuropathy    Peripheral vascular disease ([place])    Pneumonia    Pre-diabetes    Refusal of blood transfusions as patient is Jehov[employer]'s Witness    NO BLOOD OR BLOOD PRODUCTS. Albumin okay.    Stroke [facility]) 1990s   "mini stroke; left lower mouth a little bit twisted since" (01/30/2018)   Past Surgical History:  Procedure Laterality Date   ABDOMINAL AORTOGRAM W/LOWER EXTREMITY N/A 01/30/2018   Procedure: ABDOMINAL AORTOGRAM W/LOWER EXTREMITY;  Surgeon: Lorretta Harp, MD;  Location: [place] [facility];  Service: Cardiovascular;  Laterality: N/A;   ABDOMINAL AORTOGRAM W/LOWER EXTREMITY N/A 03/03/2020   Procedure: ABDOMINAL AORTOGRAM W/LOWER EXTREMITY;  Surgeon: Lorretta Harp, MD;  Location: [place] [facility];  Service: Cardiovascular;  Laterality: N/A;   ABDOMINAL AORTOGRAM W/LOWER EXTREMITY N/A 08/31/2021  Procedure: ABDOMINAL AORTOGRAM W/LOWER EXTREMITY;  Surgeon: Lorretta Harp, MD;  Location: [place] [facility];  Service: Cardiovascular;  Laterality: N/A;   AMPUTATION Left 09/02/2021   Procedure: AMPUTATION GREAT TOE;  Surgeon: Felipa Furnace, DPM;  Location: [place];  Service: Podiatry;  Laterality: Left;   APPENDECTOMY     BASCILIC VEIN TRANSPOSITION Right 10/03/2013   Procedure: BRACHIOCEPHALIC Arteriovenous  Fistula ;  Surgeon: Mal Misty, MD;  Location: [place];  Service: Vascular;  Laterality: Right;   [place] Right 11/17/2016   Procedure: BASCILIC VEIN TRANSPOSITION- RIGHT SINGLE STAGE;  Surgeon: Conrad Bayfield, MD;  Location: [place];  Service: Vascular;  Laterality: Right;   COLONOSCOPY     CYST EXCISION  1980's   cyst removed from lower abdomen   DILATION AND CURETTAGE OF UTERUS  1987   ESOPHAGOGASTRODUODENOSCOPY     PERIPHERAL VASCULAR BALLOON ANGIOPLASTY Right 01/30/2018   Procedure: PERIPHERAL VASCULAR BALLOON ANGIOPLASTY;  Surgeon: Lorretta Harp, MD;  Location: [place] [facility];  Service: Cardiovascular;  Laterality: Right;  superficial femoral   PERIPHERAL VASCULAR BALLOON ANGIOPLASTY Left 08/31/2021   Procedure: PERIPHERAL VASCULAR BALLOON ANGIOPLASTY;  Surgeon: Lorretta Harp, MD;  Location: [place] [facility];  Service: Cardiovascular;  Laterality: Left;   PERIPHERAL VASCULAR INTERVENTION  03/03/2020   Procedure: PERIPHERAL VASCULAR INTERVENTION;  Surgeon: Lorretta Harp, MD;  Location: [place] [facility];  Service: Cardiovascular;;  external iliac stent left sfa lithotripsy/ drug coated balloon   SHOULDER ARTHROSCOPY W/ ROTATOR CUFF REPAIR Right 2013   TRANSURETHRAL RESECTION OF BLADDER TUMOR N/A 07/22/2021   Procedure: TRANSURETHRAL RESECTION OF BLADDER TUMOR (TURBT);  Surgeon: Vira Agar, MD;  Location: [facility];  Service: Urology;  Laterality: N/A;   TUBAL LIGATION  1986   UPPER EXTREMITY VENOGRAPHY Bilateral 11/08/2016   Procedure: Bilateral Upper Extremity Venography;  Surgeon: Conrad Othello, MD;  Location: [place] [facility];  Service: Cardiovascular;  Laterality: Bilateral;   Social History:  reports that she quit smoking about 53 years ago. Her smoking use included cigarettes. She has a 2.50 pack-year smoking history. She has never used smokeless tobacco. She reports that she does not currently use alcohol. She reports that she does not  currently use drugs after having used the following drugs: Marijuana.  Allergies  Allergen Reactions   Blood-Group Specific Substance    Tylenol [Acetaminophen] Rash    Family History  Problem Relation Age of Onset   Depression Mother    Hypertension Mother    Depression Father    Depression Sister    Hypertension Sister     Prior to Admission medications   Medication Sig Start Date End Date Taking? Authorizing Provider  acetaminophen (TYLENOL) 325 MG tablet Take 2 tablets (650 mg total) by mouth every 4 (four) hours as needed for headache or mild pain. 09/04/21   Eugenie Filler, MD  allopurinol (ZYLOPRIM) 100 MG tablet Take 100 mg by mouth daily. 05/25/13   [provider]  aspirin EC 81 MG tablet Take 81 mg by mouth 4 (four) times a week.    [provider]  atorvastatin (LIPITOR) 80 MG tablet TAKE 1 TABLET BY MOUTH ONCE DAILY AT  6PM 12/25/21   Lorretta Harp, MD  Blood Glucose Monitoring Suppl (ACCU-CHEK AVIVA PLUS) w/Device KIT  10/29/17   [provider]  calcium acetate (PHOSLO) 667 MG capsule Take 667 mg by mouth 2 (two) times daily with a meal. 06/22/13  [provider]  cinacalcet (SENSIPAR) 60 MG tablet Take 60 mg by mouth every Monday, Wednesday, and Friday.    [provider]  clopidogrel (PLAVIX) 75 MG tablet Take 1 tablet by mouth once daily 11/27/21   Lorretta Harp, MD  gabapentin (NEURONTIN) 100 MG capsule Take 1 capsule by mouth every morning and 1 capsule at bedtime. PATIENT NEEDS OFFICE VISIT FOR ADDITIONAL REFILLS Patient taking differently: Take 100 mg by mouth 2 (two) times daily as needed (pain). 05/25/13   Collene Leyden, PA-C  glucose blood test strip Use to test blood sugar daily. Dx code: 250.02. 11/06/12   Theda Sers, PA-C  Lancets MISC Use to test blood sugar daily. Dx code 70.02. 11/08/12   Collene Leyden, PA-C  lidocaine-prilocaine (EMLA) cream Apply 1 application topically Every  Tuesday,Thursday,and Saturday with dialysis.  01/12/18   [provider]  multivitamin (RENA-VIT) TABS tablet Take 1 tablet by mouth daily.    [provider]  oxybutynin (DITROPAN) 5 MG tablet Take 1 tablet (5 mg total) by mouth 3 (three) times daily as needed for bladder spasms. 07/22/21   Vira Agar, MD  oxyCODONE (ROXICODONE) 5 MG immediate release tablet Take 1 tablet (5 mg total) by mouth every 8 (eight) hours as needed. 09/04/21 09/04/22  Eugenie Filler, MD  VELPHORO 500 MG chewable tablet Chew 500 mg by mouth daily. 11/05/21   [provider]    Physical Exam: Vitals:   06/07/22 0835 06/07/22 0930 06/07/22 1000 06/07/22 1030  BP: (!) 102/53 (!) 92/50 (!) 97/54 (!) 104/55  Pulse: 76 74 73 73  Resp: _0 Temp:      TempSrc:      SpO2: 97% 99% 100% 100%   Exam  Constitutional: Lethargic elderly female currently in no acute distress  Eyes: PERRL, lids and conjunctivae normal ENMT: Mucous membranes are dry Neck: normal, supple.  No JVD appreciated. Respiratory: Decreased overall aeration with no significant wheezes or rhonchi appreciated. Cardiovascular: Regular rate and rhythm, no murmurs / rubs / gallops. No extremity edema. 2+ pedal pulses.  Right upper EXTR only fistula. Abdomen: Tenderness to palpation of the right upper quadrant of the abdomen.  Sounds present. Musculoskeletal: no clubbing / cyanosis. No joint deformity upper and lower extremities. Good ROM, no contractures. Normal muscle tone.  Skin: no rashes, lesions, ulcers. No induration Neurologic: CN 2-12 grossly intact.  Able to move all extremities.  Psychiatric: Normal judgment and insight.  Lethargic and oriented x 3. Normal mood.   Data Reviewed:  Reviewed labs, imaging, and pertinent records as noted above in HPI  Assessment and Plan: Pulmonary embolism and DVT Acute.  Patient presents with complaints of left-sided chest pain.  However she was found to have a right-sided  pulmonary embolus with concern for right heart strain, and a right lower extremity DVT on Doppler ultrasound.  Patient was started on heparin drip per pharmacy. -Admit to a telemetry bed -Continue heparin per pharmacy.  Consider transitioning to oral agent in a.m. -Check echocardiogram -Oxycodone/morphine IV as needed for moderate to severe pain respectively -Monitor H&H  Leukocytosis Acute.  WBC elevated at 18.5.  Patient denies any reports of fever.  Suspect reactive to above. -Recheck CBC tomorrow morning  Transient hypotension Acute.  Blood pressures initially 86/40.  Blood pressures improved after IV fluid bolus 250 mL. -Goal MAP greater than 65  Hyperkalemia Acute.  Potassium elevated at 5.6.  Orders have been placed to give  the patient Lokelma 10 g p.o. -Recheck renal function panel in a.m.  ESRD on HD Patient on TTS schedule. -Appreciate nephrology consultative services.  HD planned for in a.m.  Normocytic anemia Stable.  Hemoglobin 11.8 g/dL. -Follow-up repeat CBC tomorrow morning  Peripheral vascular disease Patient with prior history of stent's -Discontinue Plavix while on anticoagulation due to increased risk of bleeding  Metastatic bladder cancer Patient's status post TURBT 07/22/2021.  CT imaging noting worsening metastatic disease with mets to the liver, lymph nodes, and lungs.  Patient appears to be hospice appropriate. -Palliative care consulted to help with goals of care   DVT prophylaxis: Heparin Advance Care Planning:   Code Status: Full Code    Consults: Palliative care  Family Communication: Daughter update  Severity of Illness: The appropriate patient status for this patient is OBSERVATION. Observation status is judged to be reasonable and necessary in order to provide the required intensity of service to ensure the patient's safety. The patient's presenting symptoms, physical exam findings, and initial radiographic and laboratory data in the context  of their medical condition is felt to place them at decreased risk for further clinical deterioration. Furthermore, it is anticipated that the patient will be medically stable for discharge from the hospital within 2 midnights of admission.   Author: Norval Morton, MD 06/07/2022 11:59 AM  For on call review [email or web address].

## 2022-06-07 NOTE — Progress Notes (Signed)
Bilateral lower extremity venous duplex has been completed. Preliminary results can be found in CV Proc through chart review.  Results were given to Dr. Tamala Julian.  06/07/22 2:37 PM Joanne Carlson RVT

## 2022-06-07 NOTE — ED Notes (Signed)
Md at bedside

## 2022-06-07 NOTE — ED Provider Notes (Signed)
[facility] EMERGENCY DEPARTMENT Provider Note   CSN: [id number] Arrival date & time: 06/06/22  2352     History  Chief Complaint  Patient presents with   Chest Pain    Joanne Carlson is a 76 y.o. female.  With past medical history significant for metastatic bladder cancer not on chemo presenting with concerns for chest pain that is worsened with deep inspiration.  She also endorses left upper abdominal pain.  She states that it started yesterday around 11 AM while at rest.  She describes the pain as "aching and sharp."  She is not sure if it radiates.  She denies nausea, vomiting.  She denies fevers.  She denies cough, congestion.  She also has a history of ESRD on hemodialysis, Tuesday Thursday Saturday.  She last received dialysis on Saturday and had a full session.  On chart review, patient arrived via EMS.  When EMS arrived, patient was hypotensive and received 250 cc of fluid.       Home Medications Prior to Admission medications   Medication Sig Start Date End Date Taking? Authorizing Provider  allopurinol (ZYLOPRIM) 100 MG tablet Take 100 mg by mouth daily. 05/25/13  Yes [provider]  aspirin EC 81 MG tablet Take 81 mg by mouth 4 (four) times a week.   Yes [provider]  atorvastatin (LIPITOR) 80 MG tablet TAKE 1 TABLET BY MOUTH ONCE DAILY AT  6PM Patient taking differently: Take 80 mg by mouth every evening. 12/25/21  Yes Lorretta Harp, MD  cinacalcet (SENSIPAR) 60 MG tablet Take 60 mg by mouth every Monday, Wednesday, and Friday.   Yes [provider]  clopidogrel (PLAVIX) 75 MG tablet Take 1 tablet by mouth once daily 11/27/21  Yes Lorretta Harp, MD  gabapentin (NEURONTIN) 100 MG capsule Take 1 capsule by mouth every morning and 1 capsule at bedtime. PATIENT NEEDS OFFICE VISIT FOR ADDITIONAL REFILLS Patient taking differently: Take 100 mg by mouth 2 (two) times daily as needed (pain). 05/25/13  Yes Marte, Nira Conn M,  PA-C  multivitamin (RENA-VIT) TABS tablet Take 1 tablet by mouth daily.   Yes [provider]  oxybutynin (DITROPAN) 5 MG tablet Take 1 tablet (5 mg total) by mouth 3 (three) times daily as needed for bladder spasms. 07/22/21  Yes Vira Agar, MD  oxyCODONE (ROXICODONE) 5 MG immediate release tablet Take 1 tablet (5 mg total) by mouth every 8 (eight) hours as needed. Patient taking differently: Take 5 mg by mouth every 8 (eight) hours as needed for moderate pain or severe pain. 09/04/21 09/04/22 Yes Eugenie Filler, MD  VELPHORO 500 MG chewable tablet Chew 500 mg by mouth 3 (three) times daily with meals. 11/05/21  Yes [provider]  Blood Glucose Monitoring Suppl (ACCU-CHEK AVIVA PLUS) w/Device KIT  10/29/17   [provider]  glucose blood test strip Use to test blood sugar daily. Dx code: 250.02. 11/06/12   Theda Sers, PA-C  Lancets MISC Use to test blood sugar daily. Dx code 49.02. 11/08/12   Collene Leyden, PA-C  lidocaine-prilocaine (EMLA) cream Apply 1 application topically Every Tuesday,Thursday,and Saturday with dialysis.  01/12/18   [provider]      Allergies    Blood-group specific substance and Tylenol [acetaminophen]    Review of Systems   Review of Systems  Cardiovascular:  Positive for chest pain.    Physical Exam Updated Vital Signs BP 138/67   Pulse 95  Temp 98.7 F (37.1 C) (Oral)   Resp 17   Ht _0  (1.626 m)   Wt 59 kg   SpO2 92%   BMI 22.31 kg/m  Physical Exam Constitutional:      General: She is not in acute distress.    Appearance: She is not toxic-appearing or diaphoretic.  HENT:     Head: Normocephalic and atraumatic.  Eyes:     Extraocular Movements: Extraocular movements intact.     Pupils: Pupils are equal, round, and reactive to light.  Cardiovascular:     Rate and Rhythm: Normal rate and regular rhythm.     Pulses:          Radial pulses are 2+ on the right side and 2+ on the left side.        Dorsalis pedis pulses are 2+ on the right side and 2+ on the left side.     Heart sounds: Normal heart sounds.  Pulmonary:     Effort: Pulmonary effort is normal. No tachypnea.     Breath sounds: Normal breath sounds.  Abdominal:     Palpations: There is mass.     Tenderness: There is abdominal tenderness (diffuse, worse in LUQ). There is no guarding or rebound.  Musculoskeletal:     Right lower leg: No tenderness. Edema present.     Left lower leg: No tenderness. Edema present.  Neurological:     Mental Status: She is alert.     ED Results / Procedures / Treatments   Labs (all labs ordered are listed, but only abnormal results are displayed) Labs Reviewed  BASIC METABOLIC PANEL - Abnormal; Notable for the following components:      Result Value   Potassium 5.6 (*)    Chloride 91 (*)    Glucose, Bld 138 (*)    BUN 43 (*)    Creatinine, Ser 5.62 (*)    Calcium 8.5 (*)    GFR, Estimated 7 (*)    All other components within normal limits  CBC - Abnormal; Notable for the following components:   WBC 18.5 (*)    Hemoglobin 11.8 (*)    RDW 17.1 (*)    Platelets 426 (*)    All other components within normal limits  BASIC METABOLIC PANEL  HEPARIN LEVEL (UNFRACTIONATED)  HEPATITIS B SURFACE ANTIGEN  HEPATITIS B SURFACE ANTIBODY, QUANTITATIVE  TROPONIN I (HIGH SENSITIVITY)  TROPONIN I (HIGH SENSITIVITY)    EKG EKG Interpretation  Date/Time:  Monday June 07 2022 00:02:57 EST Ventricular Rate:  91 PR Interval:  138 QRS Duration: 56 QT Interval:  356 QTC Calculation: 437 R Axis:   12 Text Interpretation: Normal sinus rhythm Confirmed by Ronnald Nian, Adam ([id number]) on 06/07/2022 8:34:04 AM  Radiology VAS [place] LOWER EXTREMITY VENOUS (DVT)  Result Date: 06/07/2022  Lower Venous DVT Study Patient Name:  Joanne Carlson  Date of Exam:   06/07/2022 Medical Rec #: [id number]         Accession #:    [id number] Date of Birth: 11-Jan-1946          Patient Gender: F Patient Age:   3 years  Exam Location:  [facility] Procedure:      VAS [place] LOWER EXTREMITY VENOUS (DVT) Referring Phys: Fuller Plan --------------------------------------------------------------------------------  Indications: Pulmonary embolism.  Risk Factors: Confirmed PE None identified. Anticoagulation: Heparin. Limitations: Poor ultrasound/tissue interface. Comparison Study: No prior studies. Performing Technologist: Oliver Hum RVT  Examination Guidelines: A complete evaluation includes B-mode imaging, spectral  Doppler, color Doppler, and power Doppler as needed of all accessible portions of each vessel. Bilateral testing is considered an integral part of a complete examination. Limited examinations for reoccurring indications may be performed as noted. The reflux portion of the exam is performed with the patient in reverse Trendelenburg.  +---------+---------------+---------+-----------+----------+--------------+ RIGHT    CompressibilityPhasicitySpontaneityPropertiesThrombus Aging +---------+---------------+---------+-----------+----------+--------------+ CFV      Partial        Yes      No                   Acute          +---------+---------------+---------+-----------+----------+--------------+ SFJ      Full                                                        +---------+---------------+---------+-----------+----------+--------------+ FV Prox  Full                                                        +---------+---------------+---------+-----------+----------+--------------+ FV Mid   Full                                                        +---------+---------------+---------+-----------+----------+--------------+ FV Distal               Yes      Yes                                 +---------+---------------+---------+-----------+----------+--------------+ PFV      Partial        Yes      No                   Acute           +---------+---------------+---------+-----------+----------+--------------+ POP      Full           Yes      No                                  +---------+---------------+---------+-----------+----------+--------------+ PTV      Full                                                        +---------+---------------+---------+-----------+----------+--------------+ PERO     Full                                                        +---------+---------------+---------+-----------+----------+--------------+ EIV  Yes      No                                  +---------+---------------+---------+-----------+----------+--------------+ CIV                     Yes      No                                  +---------+---------------+---------+-----------+----------+--------------+   +---------+---------------+---------+-----------+----------+--------------+ LEFT     CompressibilityPhasicitySpontaneityPropertiesThrombus Aging +---------+---------------+---------+-----------+----------+--------------+ CFV      Full           Yes      No                                  +---------+---------------+---------+-----------+----------+--------------+ SFJ      Full                                                        +---------+---------------+---------+-----------+----------+--------------+ FV Prox  Full                                                        +---------+---------------+---------+-----------+----------+--------------+ FV Mid                  Yes      No                                  +---------+---------------+---------+-----------+----------+--------------+ FV DistalFull           Yes      No                                  +---------+---------------+---------+-----------+----------+--------------+ PFV      Full                                                         +---------+---------------+---------+-----------+----------+--------------+ POP      Full           Yes      No                                  +---------+---------------+---------+-----------+----------+--------------+ PTV      Full                                                        +---------+---------------+---------+-----------+----------+--------------+ PERO     Full                                                        +---------+---------------+---------+-----------+----------+--------------+  Summary: RIGHT: - Findings consistent with acute deep vein thrombosis involving the right common femoral vein, and right proximal profunda vein. - No cystic structure found in the popliteal fossa.  LEFT: - There is no evidence of deep vein thrombosis in the lower extremity.  - No cystic structure found in the popliteal fossa.  *See table(s) above for measurements and observations.    Preliminary    CT ABDOMEN PELVIS W CONTRAST  Result Date: 06/07/2022 CLINICAL DATA:  Abdominal pain. History of bladder cancer. * Tracking Code: BO * EXAM: CT ABDOMEN AND PELVIS WITH CONTRAST TECHNIQUE: Multidetector CT imaging of the abdomen and pelvis was performed using the standard protocol following bolus administration of intravenous contrast. RADIATION DOSE REDUCTION: This exam was performed according to the departmental dose-optimization program which includes automated exposure control, adjustment of the mA and/or kV according to patient size and/or use of iterative reconstruction technique. CONTRAST:  59m OMNIPAQUE IOHEXOL 350 MG/ML SOLN COMPARISON:  01/13/2022 FINDINGS: Lower chest: Please see report for chest CTA performed at the same time and dictated separately. Hepatobiliary: Interval development of innumerable hypoenhancing liver lesions ranging in size from several mm up to about 13 mm maximum size. Imaging features compatible with metastatic disease. Gallbladder is distended with layering  tiny stones or sludge towards the fundus. No intrahepatic or extrahepatic biliary dilation. Pancreas: No focal mass lesion. No dilatation of the main duct. No intraparenchymal cyst. No peripancreatic edema. Spleen: No splenomegaly. No focal mass lesion. Adrenals/Urinary Tract: No adrenal nodule or mass. Right kidney surgically absent. Stable 2.6 cm simple cyst interpolar left kidney with left moderate hydroureteronephrosis. Left ureter is dilated down to the UVJ region. As on the prior study, there is a large heterogeneously enhancing mass lesion occupying the location of the bladder. This was measured previously at 8.9 x 8.3 cm which compares 2 8.3 x 8.3 cm today when measured in a similar fashion. Stomach/Bowel: Stomach is unremarkable. No gastric wall thickening. No evidence of outlet obstruction. Duodenum is normally positioned as is the ligament of Treitz. No small bowel wall thickening. No small bowel dilatation. The terminal ileum is normal. The appendix is not well visualized, but there is no edema or inflammation in the region of the cecum. No gross colonic mass. No colonic wall thickening. Vascular/Lymphatic: There is advanced atherosclerotic calcification of the abdominal aorta without aneurysm. There is no gastrohepatic or hepatoduodenal ligament lymphadenopathy. 10 mm short axis retrocaval lymph node on 35/3 is new in the interval. 10 mm short axis left para-aortic lymph node on 45/3 is new since prior. 15 mm short axis right external iliac node on 67/3 was 7 mm short axis previously. 16 mm short axis left external iliac node on 65/3 was 5-6 mm previously. Reproductive: There is no adnexal mass. Other: No substantial intraperitoneal free fluid. Musculoskeletal: Small umbilical hernia contains only fat. No worrisome lytic or sclerotic osseous abnormality. IMPRESSION: 1. Interval development of innumerable hypoenhancing liver lesions ranging in size from several mm up to about 13 mm maximum size. Imaging  features compatible with metastatic disease. 2. Interval progression of retroperitoneal and bilateral external iliac lymphadenopathy, consistent with metastatic disease. 3. Similar appearance of a large heterogeneously enhancing mass lesion occupying the location of the bladder. Moderate left hydroureteronephrosis with left ureter dilated down to the UVJ region, similar to prior. 4. Status post right nephrectomy. 5. Small umbilical hernia contains only fat. 6.  Aortic Atherosclerosis (ICD10-I70.0). Electronically Signed   By: EMisty StanleyM.D.   On: 06/07/2022 11:16  CT Angio Chest Pulmonary Embolism (PE) W or WO Contrast  Result Date: 06/07/2022 CLINICAL DATA:  PE suspected EXAM: CT ANGIOGRAPHY CHEST WITH CONTRAST TECHNIQUE: Multidetector CT imaging of the chest was performed using the standard protocol during bolus administration of intravenous contrast. Multiplanar CT image reconstructions and MIPs were obtained to evaluate the vascular anatomy. RADIATION DOSE REDUCTION: This exam was performed according to the departmental dose-optimization program which includes automated exposure control, adjustment of the mA and/or kV according to patient size and/or use of iterative reconstruction technique. CONTRAST:  IV contrast was used to improve disease detection. COMPARISON:  CT AP 01/13/22 FINDINGS: Cardiovascular: Satisfactory opacification of the pulmonary arteries to the segmental level. There is an acute PE in a right lower lobe segmental pulmonary artery (series 6, image 146). RV/LV ratio >1. No pericardial effusion. Mediastinum/Nodes: No enlarged mediastinal, or axillary lymph nodes. There is a enlarged 1.6 cm right hilar lymph node (series 6, image 125). Thyroid gland, trachea, and esophagus demonstrate no significant findings. Lungs/Pleura: No pleural effusion. No pneumothorax. Bibasilar atelectasis. 12Multiple bilateral pulmonary nodules, which are either new or enlarged compared to prior CT abdomen and  pelvis dated 01/14/2019. Index nodules include a 4 mm nodule in the right upper lobe (series 6, image 67), a 6 mm nodule in the right upper lobe (series 6, image 96), a 1.2 cm perifissural right lower lobe pulmonary nodule (series 6, image 99), a 1.4 cm left lower lobe pulmonary nodule (series 6, image 133), a 1.3 cm right lower lobe pulmonary nodule (series 6, image 160). Upper Abdomen: The see separately dictated CT abdomen and pelvis for additional findings Musculoskeletal: There is a 1.9 x 1.9 cm subcutaneous hypodense lesion in the right axilla (series 6, image 25). Review of the MIP images confirms the above findings. IMPRESSION: 1. Acute PE in a right lower lobe segmental pulmonary artery. RV/LV ratio is greater than 1, which is suggestive of right heart strain. 2. Multiple bilateral pulmonary nodules, which are either new or enlarged compared to prior CT abdomen and pelvis dated 01/13/2022, concerning for worsening pulmonary metastatic disease. 3. Enlarged right hilar lymph node, also concerning for metastatic disease. 4. Indeterminate 1.9 cm subcutaneous hypodense lesion in the right axilla, nonspecific. Recommend correlation with physical exam. 5. Please see same day CT AP for additional findings. Electronically Signed   By: Marin Roberts M.D.   On: 06/07/2022 11:16   DG Chest 2 View  Result Date: 06/07/2022 CLINICAL DATA:  Chest pain. EXAM: CHEST - 2 VIEW COMPARISON:  January 21, 2016 FINDINGS: The heart size and mediastinal contours are within normal limits. There is marked severity calcification of the aortic arch. Low lung volumes are noted. Mild atelectasis is seen within the bilateral lung bases. There is no evidence of focal consolidation, pleural effusion or pneumothorax. The visualized skeletal structures are unremarkable. IMPRESSION: Low lung volumes with mild bibasilar atelectasis. Electronically Signed   By: Virgina Norfolk M.D.   On: 06/07/2022 00:59    Procedures Procedures     Medications Ordered in ED Medications  heparin bolus via infusion 2,000 Units (has no administration in time range)  heparin ADULT infusion 100 units/mL (25000 units/215m) (has no administration in time range)  oxyCODONE-acetaminophen (PERCOCET/ROXICET) 5-325 MG per tablet 1 tablet (has no administration in time range)  morphine (PF) 2 MG/ML injection 2 mg (has no administration in time range)  sodium chloride flush (NS) 0.9 % injection 3 mL (has no administration in time range)  albuterol (PROVENTIL) (2.5 MG/3ML) 0.083%  nebulizer solution 2.5 mg (has no administration in time range)  sodium zirconium cyclosilicate (LOKELMA) packet 10 g (0 g Oral Hold 06/07/22 1547)  sucroferric oxyhydroxide (VELPHORO) chewable tablet 1,000 mg (has no administration in time range)  cinacalcet (SENSIPAR) tablet 30 mg (has no administration in time range)  lactated ringers bolus 250 mL (250 mLs Intravenous New Bag/Given 06/07/22 1213)  iohexol (OMNIPAQUE) 350 MG/ML injection 75 mL (75 mLs Intravenous Contrast Given 06/07/22 1102)    ED Course/ Medical Decision Making/ A&P                           Medical Decision Making Amount and/or Complexity of Data Reviewed External Data Reviewed: labs and notes. Labs: ordered. Decision-making details documented in ED Course. Radiology: ordered and independent interpretation performed. Decision-making details documented in ED Course. ECG/medicine tests: ordered and independent interpretation performed. Decision-making details documented in ED Course.  Risk Prescription drug management. Decision regarding hospitalization.   Patient presents as above.  Differential considered includes PE in the setting of known cancer, worsening metastases including bony metastases, pneumonia, intra-abdominal process.  I had an extensive conversation with the patient and her daughter at bedside.  The patient does not make urine.  She is on hemodialysis Tuesday Thursday Saturday.   We discussed potential risks and benefits of IV contrast in the setting of her chronic kidney disease.  They expressed understanding.  The the risk of missing a PE or intra-abdominal infection I believe are greater than any theoretical risk to her kidneys from the IV contrast.  The patient and her daughter expressed understanding to the possible risks of IV contrast and are okay with pursuing a contrasted CT scan.  On chart review, patient was evaluated by oncology 02/17/2022.  Per that note, the patient had a rapidly progressive disease that would not have repeated benefit from systemic chemo.  Patient was not initiated on chemotherapeutics.  Labs notable for leukocytosis at 18.5, hemoglobin very slightly low at 11.8.  Platelets elevated at 426.  Slightly elevated potassium at 5.6 with hemolysis.  Creatinine elevated at 5.62, BUN elevated at 43.  CT abdomen pelvis showed new lesions on the patient's liver, progression of lymphadenopathy consistent with her known metastatic disease, left hydroureteronephrosis similar to prior.  CT PE study was notable for an acute PE in the right lower lobe segmental pulmonary artery.  She also has multiple pulmonary nodules bilaterally.  Heparin drip initiated.  Due to patient's persistent hematuria and known bladder cancer, pharmacy and I had an extensive conversation about initiating heparin.  Patient will need heparin initiated as she has some findings concerning for right heart strain, heparin initiated.  Patient admitted to medicine service for further management of her acute PE in the setting of her metastatic cancer.        Final Clinical Impression(s) / ED Diagnoses Final diagnoses:  None    Rx / DC Orders ED Discharge Orders     None         Luster Landsberg, MD 06/07/22 [place], Alexander, DO 06/08/22 [phone]

## 2022-06-07 NOTE — Progress Notes (Signed)
   Brief PMT Note: Referral received for Joanne Carlson :goals of care discussion. Patient assessed and goals of care conversation was had with patient, her daughter, and her son.   SUMMARY OF RECOMMENDATIONS  -Code status changed to DNR  -Continue full scope treatment for now -Spiritual care consult for advance directives; patient would like to name her daughter Joanne Carlson as Joanne Carlson when notary is available on 12/6 -MOST form introduced today -Meet again for [place] discussion 12/6 at 10AM   Thank you for your consult and allowing PMT to assist in Joanne Carlson's care. Full consult note to follow.  Dorthy Cooler, [facility] Palliative Medicine Team  Team Phone # [phone]   NO CHARGE

## 2022-06-07 NOTE — Consult Note (Signed)
[facility] Renal Consultation Note    Indication for Consultation:  Management of ESRD/hemodialysis, anemia, hypertension/volume, and secondary hyperparathyroidism. PCP:  HPI: Joanne Carlson is a 76 y.o. female with ESRD, HTN, HL, Jehovah's witness, and metastatic bladder cancer who was admitted with pulmonary embolus.  Was feeling well until yesterday when she developed sharp L sided chest pain with inspiration/breathing. No specific CP. No fever, chills. No cough/congestion. No abdominal pain, N/V/D.  Presented to ED. There - she was hypotensive but afebrile. Labs with Na 135, K 5.6, CO2 29, BUN 43, Ca 8.5, WBC 18.5, Hgb 11.8, trop 9. CXR clear. EKG NSR. CT chest/abd with new RLL pulmonary edema, also with lung/liver nodules and lymphadenopathy related to her known metastatic cancer.  Now - still with inspiratory pain, but otherwise feels ok.  Dialyzes on TTS schedule at [facility] clinic. No recent issue with dialysis.  Past Medical History:  Diagnosis Date   Arthritis    "legs, knees; not bad" (01/30/2018)   Diabetic foot infection ([place]) 10/02/2021   Dizziness    occasionally   ESRD (end stage renal disease) ([place])    [name]; TTS" (01/30/2018)   History of colon polyps    History of gout    takes Allopurinol daily (01/30/2018)   Hyperlipidemia    takes Pravastatin daily   Hypertension    takes Monopril daily   Joint pain    PAD (peripheral artery disease) ([place])    Peripheral neuropathy    Peripheral vascular disease ([place])    Pneumonia    Pre-diabetes    Refusal of blood transfusions as patient is Jehovah's Witness    NO BLOOD OR BLOOD PRODUCTS. Albumin okay.    Stroke [facility]) 1990s   "mini stroke; left lower mouth a little bit twisted since" (01/30/2018)   Past Surgical History:  Procedure Laterality Date   ABDOMINAL AORTOGRAM W/LOWER EXTREMITY N/A 01/30/2018   Procedure: ABDOMINAL AORTOGRAM W/LOWER EXTREMITY;  Surgeon: Lorretta Harp, MD;   Location: [place] [facility];  Service: Cardiovascular;  Laterality: N/A;   ABDOMINAL AORTOGRAM W/LOWER EXTREMITY N/A 03/03/2020   Procedure: ABDOMINAL AORTOGRAM W/LOWER EXTREMITY;  Surgeon: Lorretta Harp, MD;  Location: [place] [facility];  Service: Cardiovascular;  Laterality: N/A;   ABDOMINAL AORTOGRAM W/LOWER EXTREMITY N/A 08/31/2021   Procedure: ABDOMINAL AORTOGRAM W/LOWER EXTREMITY;  Surgeon: Lorretta Harp, MD;  Location: [place] [facility];  Service: Cardiovascular;  Laterality: N/A;   AMPUTATION Left 09/02/2021   Procedure: AMPUTATION GREAT TOE;  Surgeon: Felipa Furnace, DPM;  Location: [place];  Service: Podiatry;  Laterality: Left;   APPENDECTOMY     BASCILIC VEIN TRANSPOSITION Right 10/03/2013   Procedure: BRACHIOCEPHALIC Arteriovenous Fistula ;  Surgeon: Mal Misty, MD;  Location: [place];  Service: Vascular;  Laterality: Right;   [place] Right 11/17/2016   Procedure: BASCILIC VEIN TRANSPOSITION- RIGHT SINGLE STAGE;  Surgeon: Conrad [place], MD;  Location: [place];  Service: Vascular;  Laterality: Right;   COLONOSCOPY     CYST EXCISION  1980's   cyst removed from lower abdomen   DILATION AND CURETTAGE OF UTERUS  1987   ESOPHAGOGASTRODUODENOSCOPY     PERIPHERAL VASCULAR BALLOON ANGIOPLASTY Right 01/30/2018   Procedure: PERIPHERAL VASCULAR BALLOON ANGIOPLASTY;  Surgeon: Lorretta Harp, MD;  Location: [place] [facility];  Service: Cardiovascular;  Laterality: Right;  superficial femoral   PERIPHERAL VASCULAR BALLOON ANGIOPLASTY Left 08/31/2021   Procedure: PERIPHERAL VASCULAR BALLOON ANGIOPLASTY;  Surgeon: [name]  J, MD;  Location: [place] [facility];  Service: Cardiovascular;  Laterality: Left;   PERIPHERAL VASCULAR INTERVENTION  03/03/2020   Procedure: PERIPHERAL VASCULAR INTERVENTION;  Surgeon: Lorretta Harp, MD;  Location: [place] [facility];  Service: Cardiovascular;;  external iliac stent left sfa lithotripsy/ drug coated balloon   SHOULDER  ARTHROSCOPY W/ ROTATOR CUFF REPAIR Right 2013   TRANSURETHRAL RESECTION OF BLADDER TUMOR N/A 07/22/2021   Procedure: TRANSURETHRAL RESECTION OF BLADDER TUMOR (TURBT);  Surgeon: Vira Agar, MD;  Location: [facility];  Service: Urology;  Laterality: N/A;   TUBAL LIGATION  1986   UPPER EXTREMITY VENOGRAPHY Bilateral 11/08/2016   Procedure: Bilateral Upper Extremity Venography;  Surgeon: Conrad [place], MD;  Location: [place] [facility];  Service: Cardiovascular;  Laterality: Bilateral;   Family History  Problem Relation Age of Onset   Depression Mother    Hypertension Mother    Depression Father    Depression Sister    Hypertension Sister    Social History:  reports that she quit smoking about 53 years ago. Her smoking use included cigarettes. She has a 2.50 pack-year smoking history. She has never used smokeless tobacco. She reports that she does not currently use alcohol. She reports that she does not currently use drugs after having used the following drugs: Marijuana.  ROS: As per HPI otherwise negative.  Vitals:   06/07/22 1000 06/07/22 1030 06/07/22 1211 06/07/22 1211  BP: (!) 97/54 (!) 104/55    Pulse: 73 73    Resp: 18 15    Temp:   98.7 F (37.1 C)   TempSrc:   Oral   SpO2: 100% 100%    Weight:    59 kg  Height:    _0  (1.626 m)     General: Well developed, well nourished, in no acute distress. With nasal O2. Head: Normocephalic, atraumatic, sclera non-icteric, mucus membranes are moist. Neck: Supple without lymphadenopathy/masses. JVD not elevated. Lungs: Clear bilaterally to auscultation without wheezes, rales, or rhonchi. Pain in L lateral chest with breathing. Heart: RRR with normal S1, S2. No murmurs, rubs, or gallops appreciated. Abdomen: Soft, non-tender, non-distended with normoactive bowel sounds.  Musculoskeletal:  Strength and tone appear normal for age. Lower extremities: Trace BLE edema. Neuro: Alert and oriented X 3. Moves all extremities  spontaneously. Psych:  Responds to questions appropriately with a normal affect. Dialysis Access: RUE AVF + bruit  Allergies  Allergen Reactions   Blood-Group Specific Substance    Tylenol [Acetaminophen] Rash   Prior to Admission medications   Medication Sig Start Date End Date Taking? Authorizing Provider  allopurinol (ZYLOPRIM) 100 MG tablet Take 100 mg by mouth daily. 05/25/13  Yes [provider]  aspirin EC 81 MG tablet Take 81 mg by mouth 4 (four) times a week.   Yes [provider]  atorvastatin (LIPITOR) 80 MG tablet TAKE 1 TABLET BY MOUTH ONCE DAILY AT  6PM Patient taking differently: Take 80 mg by mouth every evening. 12/25/21  Yes Lorretta Harp, MD  cinacalcet (SENSIPAR) 60 MG tablet Take 60 mg by mouth every Monday, Wednesday, and Friday.   Yes [provider]  clopidogrel (PLAVIX) 75 MG tablet Take 1 tablet by mouth once daily 11/27/21  Yes Lorretta Harp, MD  gabapentin (NEURONTIN) 100 MG capsule Take 1 capsule by mouth every morning and 1 capsule at bedtime. PATIENT NEEDS OFFICE VISIT FOR ADDITIONAL REFILLS Patient taking differently: Take 100 mg by mouth 2 (two) times daily as  needed (pain). 05/25/13  Yes Marte, Nira Conn M, PA-C  multivitamin (RENA-VIT) TABS tablet Take 1 tablet by mouth daily.   Yes [provider]  oxybutynin (DITROPAN) 5 MG tablet Take 1 tablet (5 mg total) by mouth 3 (three) times daily as needed for bladder spasms. 07/22/21  Yes Vira Agar, MD  oxyCODONE (ROXICODONE) 5 MG immediate release tablet Take 1 tablet (5 mg total) by mouth every 8 (eight) hours as needed. Patient taking differently: Take 5 mg by mouth every 8 (eight) hours as needed for moderate pain or severe pain. 09/04/21 09/04/22 Yes Eugenie Filler, MD  VELPHORO 500 MG chewable tablet Chew 500 mg by mouth 3 (three) times daily with meals. 11/05/21  Yes [provider]  Blood Glucose Monitoring Suppl (ACCU-CHEK AVIVA PLUS) w/Device KIT   10/29/17   [provider]  glucose blood test strip Use to test blood sugar daily. Dx code: 250.02. 11/06/12   Theda Sers, PA-C  Lancets MISC Use to test blood sugar daily. Dx code 25.02. 11/08/12   Collene Leyden, PA-C  lidocaine-prilocaine (EMLA) cream Apply 1 application topically Every Tuesday,Thursday,and Saturday with dialysis.  01/12/18   [provider]   Current Facility-Administered Medications  Medication Dose Route Frequency Provider Last Rate Last Admin   albuterol (PROVENTIL) (2.5 MG/3ML) 0.083% nebulizer solution 2.5 mg  2.5 mg Nebulization Q6H PRN Tamala Julian, Rondell A, MD       heparin ADULT infusion 100 units/mL (25000 units/249m)  950 Units/hr Intravenous Continuous BHenri Medal RPH       heparin bolus via infusion 2,000 Units  2,000 Units Intravenous Once BHenri Medal RPH       morphine (PF) 2 MG/ML injection 2 mg  2 mg Intravenous Q4H PRN SNorval Morton MD       oxyCODONE-acetaminophen (PERCOCET/ROXICET) 5-325 MG per tablet 1 tablet  1 tablet Oral Q4H PRN Smith, Rondell A, MD       sodium chloride flush (NS) 0.9 % injection 3 mL  3 mL Intravenous Q12H Smith, Rondell A, MD       sodium zirconium cyclosilicate (LOKELMA) packet 10 g  10 g Oral Once SLoren Racer PA-C       Current Outpatient Medications  Medication Sig Dispense Refill   allopurinol (ZYLOPRIM) 100 MG tablet Take 100 mg by mouth daily.     aspirin EC 81 MG tablet Take 81 mg by mouth 4 (four) times a week.     atorvastatin (LIPITOR) 80 MG tablet TAKE 1 TABLET BY MOUTH ONCE DAILY AT  6PM (Patient taking differently: Take 80 mg by mouth every evening.) 90 tablet 1   cinacalcet (SENSIPAR) 60 MG tablet Take 60 mg by mouth every Monday, Wednesday, and Friday.     clopidogrel (PLAVIX) 75 MG tablet Take 1 tablet by mouth once daily 90 tablet 1   gabapentin (NEURONTIN) 100 MG capsule Take 1 capsule by mouth every morning and 1 capsule at bedtime. PATIENT NEEDS OFFICE VISIT FOR ADDITIONAL  REFILLS (Patient taking differently: Take 100 mg by mouth 2 (two) times daily as needed (pain).) 60 capsule 0   multivitamin (RENA-VIT) TABS tablet Take 1 tablet by mouth daily.     oxybutynin (DITROPAN) 5 MG tablet Take 1 tablet (5 mg total) by mouth 3 (three) times daily as needed for bladder spasms. 20 tablet 1   oxyCODONE (ROXICODONE) 5 MG immediate release tablet Take 1 tablet (5 mg total) by mouth every 8 (eight) hours as  needed. (Patient taking differently: Take 5 mg by mouth every 8 (eight) hours as needed for moderate pain or severe pain.) 15 tablet 0   VELPHORO 500 MG chewable tablet Chew 500 mg by mouth 3 (three) times daily with meals.     Blood Glucose Monitoring Suppl (ACCU-CHEK AVIVA PLUS) w/Device KIT   0   glucose blood test strip Use to test blood sugar daily. Dx code: 250.02. 100 each 3   Lancets MISC Use to test blood sugar daily. Dx code 250.02. 100 each 3   lidocaine-prilocaine (EMLA) cream Apply 1 application topically Every Tuesday,Thursday,and Saturday with dialysis.   12   Labs: Basic Metabolic Panel: Recent Labs  Lab 06/07/22 0012  NA 135  K 5.6*  CL 91*  CO2 29  GLUCOSE 138*  BUN 43*  CREATININE 5.62*  CALCIUM 8.5*   CBC: Recent Labs  Lab 06/07/22 0012  WBC 18.5*  HGB 11.8*  HCT 38.2  MCV 90.5  PLT 426*   Studies/Results: CT ABDOMEN PELVIS W CONTRAST  Result Date: 06/07/2022 CLINICAL DATA:  Abdominal pain. History of bladder cancer. * Tracking Code: BO * EXAM: CT ABDOMEN AND PELVIS WITH CONTRAST TECHNIQUE: Multidetector CT imaging of the abdomen and pelvis was performed using the standard protocol following bolus administration of intravenous contrast. RADIATION DOSE REDUCTION: This exam was performed according to the departmental dose-optimization program which includes automated exposure control, adjustment of the mA and/or kV according to patient size and/or use of iterative reconstruction technique. CONTRAST:  63m OMNIPAQUE IOHEXOL 350 MG/ML SOLN  COMPARISON:  01/13/2022 FINDINGS: Lower chest: Please see report for chest CTA performed at the same time and dictated separately. Hepatobiliary: Interval development of innumerable hypoenhancing liver lesions ranging in size from several mm up to about 13 mm maximum size. Imaging features compatible with metastatic disease. Gallbladder is distended with layering tiny stones or sludge towards the fundus. No intrahepatic or extrahepatic biliary dilation. Pancreas: No focal mass lesion. No dilatation of the main duct. No intraparenchymal cyst. No peripancreatic edema. Spleen: No splenomegaly. No focal mass lesion. Adrenals/Urinary Tract: No adrenal nodule or mass. Right kidney surgically absent. Stable 2.6 cm simple cyst interpolar left kidney with left moderate hydroureteronephrosis. Left ureter is dilated down to the UVJ region. As on the prior study, there is a large heterogeneously enhancing mass lesion occupying the location of the bladder. This was measured previously at 8.9 x 8.3 cm which compares 2 8.3 x 8.3 cm today when measured in a similar fashion. Stomach/Bowel: Stomach is unremarkable. No gastric wall thickening. No evidence of outlet obstruction. Duodenum is normally positioned as is the ligament of Treitz. No small bowel wall thickening. No small bowel dilatation. The terminal ileum is normal. The appendix is not well visualized, but there is no edema or inflammation in the region of the cecum. No gross colonic mass. No colonic wall thickening. Vascular/Lymphatic: There is advanced atherosclerotic calcification of the abdominal aorta without aneurysm. There is no gastrohepatic or hepatoduodenal ligament lymphadenopathy. 10 mm short axis retrocaval lymph node on 35/3 is new in the interval. 10 mm short axis left para-aortic lymph node on 45/3 is new since prior. 15 mm short axis right external iliac node on 67/3 was 7 mm short axis previously. 16 mm short axis left external iliac node on 65/3 was 5-6 mm  previously. Reproductive: There is no adnexal mass. Other: No substantial intraperitoneal free fluid. Musculoskeletal: Small umbilical hernia contains only fat. No worrisome lytic or sclerotic osseous abnormality. IMPRESSION: 1.  Interval development of innumerable hypoenhancing liver lesions ranging in size from several mm up to about 13 mm maximum size. Imaging features compatible with metastatic disease. 2. Interval progression of retroperitoneal and bilateral external iliac lymphadenopathy, consistent with metastatic disease. 3. Similar appearance of a large heterogeneously enhancing mass lesion occupying the location of the bladder. Moderate left hydroureteronephrosis with left ureter dilated down to the UVJ region, similar to prior. 4. Status post right nephrectomy. 5. Small umbilical hernia contains only fat. 6.  Aortic Atherosclerosis (ICD10-I70.0). Electronically Signed   By: Misty Stanley M.D.   On: 06/07/2022 11:16   CT Angio Chest Pulmonary Embolism (PE) W or WO Contrast  Result Date: 06/07/2022 CLINICAL DATA:  PE suspected EXAM: CT ANGIOGRAPHY CHEST WITH CONTRAST TECHNIQUE: Multidetector CT imaging of the chest was performed using the standard protocol during bolus administration of intravenous contrast. Multiplanar CT image reconstructions and MIPs were obtained to evaluate the vascular anatomy. RADIATION DOSE REDUCTION: This exam was performed according to the departmental dose-optimization program which includes automated exposure control, adjustment of the mA and/or kV according to patient size and/or use of iterative reconstruction technique. CONTRAST:  IV contrast was used to improve disease detection. COMPARISON:  CT AP 01/13/22 FINDINGS: Cardiovascular: Satisfactory opacification of the pulmonary arteries to the segmental level. There is an acute PE in a right lower lobe segmental pulmonary artery (series 6, image 146). RV/LV ratio >1. No pericardial effusion. Mediastinum/Nodes: No enlarged  mediastinal, or axillary lymph nodes. There is a enlarged 1.6 cm right hilar lymph node (series 6, image 125). Thyroid gland, trachea, and esophagus demonstrate no significant findings. Lungs/Pleura: No pleural effusion. No pneumothorax. Bibasilar atelectasis. 12Multiple bilateral pulmonary nodules, which are either new or enlarged compared to prior CT abdomen and pelvis dated 01/14/2019. Index nodules include a 4 mm nodule in the right upper lobe (series 6, image 67), a 6 mm nodule in the right upper lobe (series 6, image 96), a 1.2 cm perifissural right lower lobe pulmonary nodule (series 6, image 99), a 1.4 cm left lower lobe pulmonary nodule (series 6, image 133), a 1.3 cm right lower lobe pulmonary nodule (series 6, image 160). Upper Abdomen: The see separately dictated CT abdomen and pelvis for additional findings Musculoskeletal: There is a 1.9 x 1.9 cm subcutaneous hypodense lesion in the right axilla (series 6, image 25). Review of the MIP images confirms the above findings. IMPRESSION: 1. Acute PE in a right lower lobe segmental pulmonary artery. RV/LV ratio is greater than 1, which is suggestive of right heart strain. 2. Multiple bilateral pulmonary nodules, which are either new or enlarged compared to prior CT abdomen and pelvis dated 01/13/2022, concerning for worsening pulmonary metastatic disease. 3. Enlarged right hilar lymph node, also concerning for metastatic disease. 4. Indeterminate 1.9 cm subcutaneous hypodense lesion in the right axilla, nonspecific. Recommend correlation with physical exam. 5. Please see same day CT AP for additional findings. Electronically Signed   By: Marin Roberts M.D.   On: 06/07/2022 11:16   DG Chest 2 View  Result Date: 06/07/2022 CLINICAL DATA:  Chest pain. EXAM: CHEST - 2 VIEW COMPARISON:  January 21, 2016 FINDINGS: The heart size and mediastinal contours are within normal limits. There is marked severity calcification of the aortic arch. Low lung volumes are noted.  Mild atelectasis is seen within the bilateral lung bases. There is no evidence of focal consolidation, pleural effusion or pneumothorax. The visualized skeletal structures are unremarkable. IMPRESSION: Low lung volumes with mild bibasilar atelectasis. Electronically  Signed   By: Virgina Norfolk M.D.   On: 06/07/2022 00:59    Dialysis Orders:  TTS at [facility] 3:30hr, 350/600, EDW 56.7kg, 2K/2.5Ca, AVF, heparin 7000unit bolus - Mircera on hold - last given 211mg on 11/9 - Hectoral 178m IV q HD  Assessment/Plan:  Pulmonary embolus: Risk factor is her bladder cancer. Echo and LE doppler [place]. Heparin drip started - further AC per primary.  Mild hyperkalemia: Lokelma 10g ordered for now with plan for HD 1st thing tomorrow.  ESRD:  Continue HD per usual TTS schedule - HD tomorrow, 1st shift if possible.  Hypertension/volume: BP low/stable, mild edema in BLE. Uses midodrine pre-HD.  Anemia: Hgb 11.8 - no ESA needs at this time.  Metabolic bone disease: Ca ok, Phos pending. Continue Velphoro + sensipar..  Metastatic bladder cancer: Per daughter - running out of treatment options.  KaVeneta PentonPA-C 06/07/2022, 2:23 PM  [employer]

## 2022-06-07 NOTE — Consult Note (Signed)
Consultation Note Date: 06/07/2022   Patient Name: Joanne Carlson  DOB: [birth year]  MRN: [id number]  Age / Sex: 76 y.o., female  PCP: Vernie Shanks, MD (Inactive) Referring Physician: Norval Morton, MD  Reason for Consultation: {Reason for Consult:23484}  HPI/Patient Profile: 76 y.o. female  with past medical history of *** admitted on 06/06/2022 with ***.    PMT has been consulted to assist with goals of care conversation.  Clinical Assessment and Goals of Care:  I have reviewed medical records including EPIC notes, labs and imaging, ***, assessed the patient and then *** with *** to discuss diagnosis prognosis, GOC, EOL wishes, disposition and options.  I introduced Palliative Medicine as specialized medical care for people living with serious illness. It focuses on providing relief from the symptoms and stress of a serious illness. The goal is to improve quality of life for both the patient and the family.  We discussed a brief life review of the patient and then focused on their current illness. ***  I attempted to elicit values and goals of care important to the patient.    Medical History Review and Understanding:  ***  Social History: ***  Functional and Nutritional State: ***  Palliative Symptoms: ***  Advance Directives: A detailed discussion regarding advanced directives was had. ***   Code Status: ***  Discussion: ***  The difference between aggressive medical intervention and comfort care was considered in light of the patient's goals of care. Hospice and Palliative Care services outpatient were explained and offered.   Discussed the importance of continued conversation with family and the medical providers regarding overall plan of care and treatment options, ensuring decisions are within the context of the patient's values and GOCs.   Questions and concerns were  addressed.  Hard Choices booklet left for review. The family was encouraged to call with questions or concerns.  PMT will continue to support holistically.   {Primary Decision KGURK:27062}    SUMMARY OF RECOMMENDATIONS   *** Code Status/Advance Care Planning: {Palliative Code status:23503}   Symptom Management:  ***  Palliative Prophylaxis:  {Palliative Prophylaxis:21015}  Additional Recommendations (Limitations, Scope, Preferences): {Recommended Scope and Preferences:21019}  Psycho-social/Spiritual:  Desire for further Chaplaincy support:{YES NO:22349} Additional Recommendations: {PAL SOCIAL:21064}  Prognosis:  {Palliative Care Prognosis:23504}  Discharge Planning: {Palliative dispostion:23505}      Primary Diagnoses: Present on Admission:  Pulmonary embolus (HCC)  DVT (deep venous thrombosis) (HCC)  ANEMIA  Leukocytosis  Transient hypotension  Hyperkalemia  End stage renal disease (HCC)  PVD (peripheral vascular disease) (HCC)  Bladder cancer (HCC)    Physical Exam  Vital Signs: BP (!) 111/51   Pulse (!) 101   Temp 98.7 F (37.1 C) (Oral)   Resp 14   Ht '5\' 4"'$  (1.626 m)   Wt 59 kg   SpO2 94%   BMI 22.31 kg/m  Pain Scale: 0-10   Pain Score: 7    SpO2: SpO2: 94 % O2 Device:SpO2: 94 % O2 Flow Rate: .    Palliative Assessment/Data:      Total time: I spent *** minutes in the care of the patient today in the above activities and documenting the encounter.  MDM:  Medical Decision Making: #/Complex Problems:                   Data Reviewed:              Management:   Konor Noren [name], PA-C  Palliative Medicine Team Team phone #  [phone]  Thank you for allowing the Palliative Medicine Team to assist in the care of this patient. Please utilize secure chat with additional questions, if there is no response within 30 minutes please call the above phone number.  Palliative Medicine Team providers are available by phone from 7am to  7pm daily and can be reached through the team cell phone.  Should this patient require assistance outside of these hours, please call the patient's attending physician.

## 2022-06-07 NOTE — Progress Notes (Signed)
ANTICOAGULATION CONSULT NOTE - Initial Consult  Pharmacy Consult for Heparin Indication: pulmonary embolus  Allergies  Allergen Reactions   Blood-Group Specific Substance    Tylenol [Acetaminophen] Rash    Patient Measurements:   Heparin Dosing Weight: 59kg   Vital Signs: Temp: 98.3 F (36.8 C) (12/04 0633) Temp Source: Oral (12/04 0218) BP: 104/55 (12/04 1030) Pulse Rate: 73 (12/04 1030)  Labs: Recent Labs    06/07/22 0012 06/07/22 0618  HGB 11.8*  --   HCT 38.2  --   PLT 426*  --   CREATININE 5.62*  --   TROPONINIHS 9 13    CrCl cannot be calculated (Unknown ideal weight.).   Medical History: Past Medical History:  Diagnosis Date   Arthritis    "legs, knees; not bad" (01/30/2018)   Diabetic foot infection ([place]) 10/02/2021   Dizziness    occasionally   ESRD (end stage renal disease) ([place])    [name]; TTS" (01/30/2018)   History of colon polyps    History of gout    takes Allopurinol daily (01/30/2018)   Hyperlipidemia    takes Pravastatin daily   Hypertension    takes Monopril daily   Joint pain    PAD (peripheral artery disease) ([place])    Peripheral neuropathy    Peripheral vascular disease ([place])    Pneumonia    Pre-diabetes    Refusal of blood transfusions as patient is Jehovah's Witness    NO BLOOD OR BLOOD PRODUCTS. Albumin okay.    Stroke [facility]) 1990s   "mini stroke; left lower mouth a little bit twisted since" (01/30/2018)     Assessment: 76 yo female presented on 06/06/2022 for chest pain. Pharmacy consulted for heparin for concern for VTE. PMH of metastatic bladder cancer and ESRD on iHD TTS. CT obtained and found to have acute PE with RHS. Patient with hematuria at baseline and currently ongoing. Discussed with resident and with metastatic bladder cancer and going hematuria will give 1/2 normal bolus to start and low threshold to target 0.3 - 0.5 units/ml goal range. Hgb 11.8. Plts 426.    Goal of Therapy:  Heparin level 0.3-0.7  units/ml Monitor platelets by anticoagulation protocol: Yes   Plan:  Heparin 2000 unit bolus  Start heparin 950 units/hr  Check 8 hr heparin level  Monitor hematuria closely  Monitor heparin level, cbc and s/s of bleeding daily   Cristela Felt, PharmD, BCPS Clinical Pharmacist 06/07/2022 12:06 PM

## 2022-06-07 NOTE — ED Provider Notes (Signed)
Supervised resident visit.  Patient here with left sided chest pain, pain with increase breathing.  History of bladder cancer not undergoing any chemotherapy.  History of hemodialysis.  EKG shows sinus rhythm.  No ischemic changes.  Differential diagnoses ACS versus PE versus ongoing oncology process.  Pain seems to be in her lower chest, upper abdomen.  She denies any infectious symptoms including cough and sputum production.  Will get CBC, BMP, troponin, CT of the chest to rule out PE, CT abdomen pelvis.  Per my review and interpretation of labs patient with white count of 18.  Opponent within normal limits.  No significant electrolyte abnormality.  Per my review of radiology report patient does have acute PE in the right lower lobe segmental pulmonary artery.  Possibly some right heart strain.  Also has new pulmonary nodules.  Also has some new developments of liver lesions as well.  Suspect likely metastatic process now in the setting of known bladder cancer.  Will start patient on IV heparin bolus and infusion and admit her to medicine for further care.  This chart was dictated using voice recognition software.  Despite best efforts to proofread,  errors can occur which can change the documentation meaning.  .Critical Care  Performed by: Lennice Sites, DO Authorized by: Lennice Sites, DO   Critical care provider statement:    Critical care time (minutes):  35   Critical care was necessary to treat or prevent imminent or life-threatening deterioration of the following conditions:  Respiratory failure (acute pulmonary embolism)   Critical care was time spent personally by me on the following activities:  Blood draw for specimens, development of treatment plan with patient or surrogate, discussions with consultants, discussions with primary provider, evaluation of patient's response to treatment, obtaining history from patient or surrogate, ordering and review of radiographic studies, pulse  oximetry, ordering and review of laboratory studies, re-evaluation of patient's condition and review of old charts   I assumed direction of critical care for this patient from another provider in my specialty: no     Care discussed with: admitting provider       Lennice Sites, DO 06/07/22 1147

## 2022-06-07 NOTE — Progress Notes (Signed)
ANTICOAGULATION CONSULT NOTE  Pharmacy Consult for Heparin Indication: pulmonary embolus  Allergies  Allergen Reactions   Blood-Group Specific Substance    Tylenol [Acetaminophen] Rash    Patient Measurements: Height: '5\' 4"'$  (162.6 cm) Weight: 59 kg (130 lb) IBW/kg (Calculated) : 54.7 Heparin Dosing Weight: 59kg   Vital Signs: Temp: 99.3 F (37.4 C) (12/04 2049) Temp Source: Oral (12/04 2049) BP: 101/44 (12/04 2100) Pulse Rate: 94 (12/04 2100)  Labs: Recent Labs    06/07/22 0012 06/07/22 0618 06/07/22 2117  HGB 11.8*  --   --   HCT 38.2  --   --   PLT 426*  --   --   HEPARINUNFRC  --   --  <0.10*  CREATININE 5.62*  --   --   TROPONINIHS 9 13  --      Estimated Creatinine Clearance: 7.2 mL/min (A) (by C-G formula based on SCr of 5.62 mg/dL (H)).   Medical History: Past Medical History:  Diagnosis Date   Arthritis    "legs, knees; not bad" (01/30/2018)   Diabetic foot infection ([place]) 10/02/2021   Dizziness    occasionally   ESRD (end stage renal disease) ([place])    Joanne Carlson; TTS" (01/30/2018)   History of colon polyps    History of gout    takes Allopurinol daily (01/30/2018)   Hyperlipidemia    takes Pravastatin daily   Hypertension    takes Monopril daily   Joint pain    PAD (peripheral artery disease) ([place])    Peripheral neuropathy    Peripheral vascular disease ([place])    Pneumonia    Pre-diabetes    Refusal of blood transfusions as patient is Jehovah's Witness    NO BLOOD OR BLOOD PRODUCTS. Albumin okay.    Stroke [facility] 1990s   "mini stroke; left lower mouth a little bit twisted since" (01/30/2018)     Assessment: 76 yo female presented on 06/06/2022 for chest pain. Pharmacy consulted for heparin for concern for VTE. PMH of metastatic bladder cancer and ESRD on iHD TTS. CT obtained and found to have acute PE with RHS. Patient with hematuria at baseline and currently ongoing.    Heparin level <0.1 on heparin 950 units/hr. RN reports no increase in  hematuria or new concern for bleeding since starting heparin.  Goal of Therapy:  Heparin level 0.3-0.7 units/ml Monitor platelets by anticoagulation protocol: Yes   Plan:  Heparin 2000 unit bolus once Increase heparin to 1150 units/hr  Check 8 hr heparin level  Monitor hematuria closely  Monitor heparin level, cbc and s/s of bleeding daily   Erskine Speed, PharmD Clinical Pharmacist 06/07/2022 9:55 PM

## 2022-06-07 NOTE — Progress Notes (Signed)
  Echocardiogram 2D Echocardiogram has been performed.  Darlina Sicilian M 06/07/2022, 3:23 PM

## 2022-06-08 DIAGNOSIS — D6869 Other thrombophilia: Secondary | ICD-10-CM | POA: Diagnosis present

## 2022-06-08 DIAGNOSIS — E785 Hyperlipidemia, unspecified: Secondary | ICD-10-CM | POA: Diagnosis present

## 2022-06-08 DIAGNOSIS — I824Y1 Acute embolism and thrombosis of unspecified deep veins of right proximal lower extremity: Secondary | ICD-10-CM | POA: Diagnosis not present

## 2022-06-08 DIAGNOSIS — C779 Secondary and unspecified malignant neoplasm of lymph node, unspecified: Secondary | ICD-10-CM | POA: Diagnosis present

## 2022-06-08 DIAGNOSIS — N186 End stage renal disease: Secondary | ICD-10-CM | POA: Diagnosis present

## 2022-06-08 DIAGNOSIS — E875 Hyperkalemia: Secondary | ICD-10-CM | POA: Diagnosis present

## 2022-06-08 DIAGNOSIS — I2699 Other pulmonary embolism without acute cor pulmonale: Secondary | ICD-10-CM | POA: Diagnosis present

## 2022-06-08 DIAGNOSIS — E1122 Type 2 diabetes mellitus with diabetic chronic kidney disease: Secondary | ICD-10-CM | POA: Diagnosis present

## 2022-06-08 DIAGNOSIS — D72829 Elevated white blood cell count, unspecified: Secondary | ICD-10-CM | POA: Diagnosis not present

## 2022-06-08 DIAGNOSIS — I82411 Acute embolism and thrombosis of right femoral vein: Secondary | ICD-10-CM | POA: Diagnosis present

## 2022-06-08 DIAGNOSIS — N2581 Secondary hyperparathyroidism of renal origin: Secondary | ICD-10-CM | POA: Diagnosis present

## 2022-06-08 DIAGNOSIS — I12 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 stage renal disease: Secondary | ICD-10-CM | POA: Diagnosis present

## 2022-06-08 DIAGNOSIS — Z89412 Acquired absence of left great toe: Secondary | ICD-10-CM | POA: Diagnosis not present

## 2022-06-08 DIAGNOSIS — C678 Malignant neoplasm of overlapping sites of bladder: Secondary | ICD-10-CM | POA: Diagnosis present

## 2022-06-08 DIAGNOSIS — Z992 Dependence on renal dialysis: Secondary | ICD-10-CM | POA: Diagnosis not present

## 2022-06-08 DIAGNOSIS — J811 Chronic pulmonary edema: Secondary | ICD-10-CM | POA: Diagnosis present

## 2022-06-08 DIAGNOSIS — Z66 Do not resuscitate: Secondary | ICD-10-CM | POA: Diagnosis not present

## 2022-06-08 DIAGNOSIS — E1142 Type 2 diabetes mellitus with diabetic polyneuropathy: Secondary | ICD-10-CM | POA: Diagnosis present

## 2022-06-08 DIAGNOSIS — Z7189 Other specified counseling: Secondary | ICD-10-CM | POA: Diagnosis not present

## 2022-06-08 DIAGNOSIS — C7801 Secondary malignant neoplasm of right lung: Secondary | ICD-10-CM | POA: Diagnosis present

## 2022-06-08 DIAGNOSIS — C787 Secondary malignant neoplasm of liver and intrahepatic bile duct: Secondary | ICD-10-CM | POA: Diagnosis present

## 2022-06-08 DIAGNOSIS — C7802 Secondary malignant neoplasm of left lung: Secondary | ICD-10-CM | POA: Diagnosis present

## 2022-06-08 DIAGNOSIS — Z95820 Peripheral vascular angioplasty status with implants and grafts: Secondary | ICD-10-CM | POA: Diagnosis not present

## 2022-06-08 DIAGNOSIS — I9589 Other hypotension: Secondary | ICD-10-CM | POA: Diagnosis present

## 2022-06-08 DIAGNOSIS — E1151 Type 2 diabetes mellitus with diabetic peripheral angiopathy without gangrene: Secondary | ICD-10-CM | POA: Diagnosis present

## 2022-06-08 DIAGNOSIS — D631 Anemia in chronic kidney disease: Secondary | ICD-10-CM | POA: Diagnosis present

## 2022-06-08 DIAGNOSIS — Z515 Encounter for palliative care: Secondary | ICD-10-CM | POA: Diagnosis not present

## 2022-06-08 LAB — RENAL FUNCTION PANEL
Albumin: 1.7 g/dL — ABNORMAL LOW (ref 3.5–5.0)
Anion gap: 14 (ref 5–15)
BUN: 57 mg/dL — ABNORMAL HIGH (ref 8–23)
CO2: 24 mmol/L (ref 22–32)
Calcium: 8.2 mg/dL — ABNORMAL LOW (ref 8.9–10.3)
Chloride: 94 mmol/L — ABNORMAL LOW (ref 98–111)
Creatinine, Ser: 7.74 mg/dL — ABNORMAL HIGH (ref 0.44–1.00)
GFR, Estimated: 5 mL/min — ABNORMAL LOW (ref >60)
Glucose, Bld: 66 mg/dL — ABNORMAL LOW (ref 70–99)
Phosphorus: 4.1 mg/dL (ref 2.5–4.6)
Potassium: 5.3 mmol/L — ABNORMAL HIGH (ref 3.5–5.1)
Sodium: 132 mmol/L — ABNORMAL LOW (ref 135–145)

## 2022-06-08 LAB — CBC
HCT: 33.8 % — ABNORMAL LOW (ref 36.0–46.0)
Hemoglobin: 10.7 g/dL — ABNORMAL LOW (ref 12.0–15.0)
MCH: 28.4 pg (ref 26.0–34.0)
MCHC: 31.7 g/dL (ref 30.0–36.0)
MCV: 89.7 fL (ref 80.0–100.0)
Platelets: 372 10*3/uL (ref 150–400)
RBC: 3.77 MIL/uL — ABNORMAL LOW (ref 3.87–5.11)
RDW: 17 % — ABNORMAL HIGH (ref 11.5–15.5)
WBC: 20.6 10*3/uL — ABNORMAL HIGH (ref 4.0–10.5)
nRBC: 0 % (ref 0.0–0.2)

## 2022-06-08 LAB — HEPARIN LEVEL (UNFRACTIONATED)
Heparin Unfractionated: <0.1 IU/mL — ABNORMAL LOW (ref 0.30–0.70)
Heparin Unfractionated: 0.12 IU/mL — ABNORMAL LOW (ref 0.30–0.70)

## 2022-06-08 MED ORDER — IPRATROPIUM-ALBUTEROL 0.5-2.5 (3) MG/3ML IN SOLN: 3.0000 mL | RESPIRATORY_TRACT | Status: DC | PRN

## 2022-06-08 MED ORDER — METOPROLOL TARTRATE 5 MG/5ML IV SOLN: 5.0000 mg | INTRAVENOUS | Status: DC | PRN

## 2022-06-08 MED ORDER — ACETAMINOPHEN 325 MG PO TABS
650.0000 mg | ORAL_TABLET | Freq: Four times a day (QID) | ORAL | Status: DC | PRN
  Filled 2022-06-08 (×2): qty 2

## 2022-06-08 MED ORDER — ANTICOAGULANT SODIUM CITRATE 4% (200MG/5ML) IV SOLN: 5.0000 mL | Status: DC | PRN

## 2022-06-08 MED ORDER — HYDRALAZINE HCL 20 MG/ML IJ SOLN: 10.0000 mg | INTRAMUSCULAR | Status: DC | PRN

## 2022-06-08 MED ORDER — LIDOCAINE HCL (PF) 1 % IJ SOLN: 5.0000 mL | INTRAMUSCULAR | Status: DC | PRN

## 2022-06-08 MED ORDER — HEPARIN BOLUS VIA INFUSION
2000.0000 [IU] | Freq: Once | INTRAVENOUS | Status: AC
  Administered 2022-06-08: 2000 [IU] via INTRAVENOUS
  Filled 2022-06-08: qty 2000

## 2022-06-08 MED ORDER — TRAZODONE HCL 50 MG PO TABS: 50.0000 mg | ORAL_TABLET | Freq: Every evening | ORAL | Status: DC | PRN

## 2022-06-08 MED ORDER — KETOROLAC TROMETHAMINE 15 MG/ML IJ SOLN
15.0000 mg | Freq: Once | INTRAMUSCULAR | Status: AC
  Administered 2022-06-08: 15 mg via INTRAVENOUS
  Filled 2022-06-08: qty 1

## 2022-06-08 MED ORDER — PROSOURCE PLUS PO LIQD
30.0000 mL | Freq: Two times a day (BID) | ORAL | Status: DC
  Administered 2022-06-09 – 2022-06-11 (×3): 30 mL via ORAL
  Filled 2022-06-08 (×6): qty 30

## 2022-06-08 MED ORDER — LIDOCAINE-PRILOCAINE 2.5-2.5 % EX CREA: 1.0000 | TOPICAL_CREAM | CUTANEOUS | Status: DC | PRN

## 2022-06-08 MED ORDER — GUAIFENESIN 100 MG/5ML PO LIQD: 5.0000 mL | ORAL | Status: DC | PRN

## 2022-06-08 MED ORDER — PENTAFLUOROPROP-TETRAFLUOROETH EX AERO: 1.0000 | INHALATION_SPRAY | CUTANEOUS | Status: DC | PRN

## 2022-06-08 MED ORDER — SENNOSIDES-DOCUSATE SODIUM 8.6-50 MG PO TABS: 1.0000 | ORAL_TABLET | Freq: Every evening | ORAL | Status: DC | PRN

## 2022-06-08 MED ORDER — ALTEPLASE 2 MG IJ SOLR: 2.0000 mg | Freq: Once | INTRAMUSCULAR | Status: DC | PRN

## 2022-06-08 NOTE — ED Notes (Signed)
Report given to dialysis RN - states pt will probably not go to dialysis until after 7am

## 2022-06-08 NOTE — Progress Notes (Signed)
Received patient in bed to unit.  Alert and oriented.  Informed consent signed and in chart.    Patient tolerated well.  Transported back to the room  Alert, without acute distress.  Hand-off given to patient's nurse.   Access used: RUA AVF Access issues: none  Total UF removed: 2L Medication(s) given: none    [facility]

## 2022-06-08 NOTE — ED Notes (Signed)
Pt provided with cup of ice

## 2022-06-08 NOTE — Progress Notes (Signed)
Daily Progress Note   Patient Name: Joanne Carlson       Date: 06/08/2022 DOB: 12-Aug-1945  Age: 76 y.o. MRN#: [id number] Attending Physician: Damita Lack, MD Primary Care Physician: Vernie Shanks, MD (Inactive) Admit Date: 06/06/2022  Reason for Consultation/Follow-up: Establishing goals of care  Subjective: Medical records reviewed including progress notes, labs, imaging. Patient assessed at the bedside.  She has just received personal care, denies any complaints at this time.  Discussed with RN.  Her daughter Joanne Carlson is outside the room and we discussed further when she joined at the bedside as well.  Created space and opportunity for patient and family's thoughts and feelings on her current illness.  Reviewed our conversation yesterday including her decision for DNR and plans for a follow-up meeting tomorrow morning at 10 AM.  She remains interested in completing HCPOA documentation tomorrow as well, will be naming her daughter Joanne Carlson.  Her son likely will not be able to attend the family meeting due to work.  Patient feels like she will be able to think better tomorrow in the morning; she cannot think of anything she would like to reflect on at this time.  Questions and concerns addressed. PMT will continue to support holistically.   Length of Stay: 0   Physical Exam Vitals and nursing note reviewed.  Constitutional:      General: She is not in acute distress.    Appearance: She is ill-appearing.  Cardiovascular:     Rate and Rhythm: Normal rate.  Pulmonary:     Effort: Pulmonary effort is normal.  Skin:    General: Skin is warm and dry.  Neurological:     Mental Status: She is alert.  Psychiatric:        Mood and Affect: Mood normal.        Behavior: Behavior normal.            Vital Signs: BP (!) 118/51   Pulse 91   Temp 98.3 F (36.8 C)   Resp 15   Ht '5\' 4"'$  (1.626 m)   Wt 59 kg   SpO2 90%   BMI 22.31 kg/m  SpO2: SpO2: 90 % O2 Device: O2 Device: Room Air O2 Flow Rate:        Palliative Assessment/Data:   Palliative Care Assessment & Plan  Patient Profile: 76 y.o. female  with past medical history of hypertension, hyperlipidemia, ESRD on HD, PVD, diabetes mellitus type 2, and metastatic bladder cancer s/p TURBT admitted on 06/06/2022 with left-sided chest pain.    In the ED, CT scan of the chest noted acute PE in the right lower lobe along with RV/LV ratio greater than 1, suggestive of right heart strain, with multiple bilateral pulmonary nodules which appear either new and/or enlarged from prior CT, and interval development of innumerable hypoenhancing liver lesions concerning for worsening metastatic disease.  Patient's treatment options have been very limited as oncology has previously recommended hospice.  Patient was seen once by outpatient palliative care in September.   PMT has been consulted to assist with goals of care conversation.  Assessment: Goals of care conversation Metastatic bladder cancer Acute PE Acute DVT of right lower extremity End-stage renal disease on dialysis  Recommendations/Plan: Continue DNR Continue full scope treatment for now Patient and her daughter remain agreeable to goals of care discussion tomorrow 12/6 at 10 AM with completion of advance directives to follow PMT will continue to follow and support   Prognosis: Poor long-term prognosis with metastatic cancer and no treatment options per oncology   Discharge Planning: To Be Determined   Care plan was discussed with patient, patient's daughter, RN  MDM high         [place], PA-C  Palliative Medicine Team Team phone # [phone]  Thank you for allowing the Palliative Medicine Team to assist in the care of this patient. Please  utilize secure chat with additional questions, if there is no response within 30 minutes please call the above phone number.  Palliative Medicine Team providers are available by phone from 7am to 7pm daily and can be reached through the team cell phone.  Should this patient require assistance outside of these hours, please call the patient's attending physician.

## 2022-06-08 NOTE — Progress Notes (Signed)
PROGRESS NOTE    Joanne Carlson  FYB:[id number] DOB: 1945-08-23 DOA: 06/06/2022 PCP: Vernie Shanks, MD (Inactive)   Brief Narrative:   76 y.o. female with medical history significant of hypertension, hyperlipidemia, ESRD on HD, PVD, diabetes mellitus type 2, and metastatic bladder cancer s/p TURBTwho presents with complaints of left side pain. CT scan of the chest noted acute PE in the right lower lobe along with RV/LV ratio greater than 1 suggestive of right heart strain with multiple bilateral pulmonary nodules.  Patient was started on heparin drip.  Nephrology was consulted for dialysis   Assessment & Plan:  Principal Problem:   Pulmonary embolus (HCC) Active Problems:   DVT (deep venous thrombosis) (HCC)   Leukocytosis   Transient hypotension   Hyperkalemia   End stage renal disease (HCC)   ANEMIA   PVD (peripheral vascular disease) (HCC)   Bladder cancer (HCC)    Acute PE and right lower lobe without cor pulmonale Right lower extremity DVT, acute Acute likely in the setting of malignancy.  CT showing evidence of right heart strain but echocardiogram does not show any evidence of strain pattern.  Continue heparin drip.  Will transition to oral anticoagulation likely tomorrow after 48 hours of IV heparin.   Leukocytosis Likely reactive in nature, no obvious signs of infection.  Will continue to monitor   Transient hypotension Transient, now resolved   Hyperkalemia Potassium still 5.3, received Lokelma.  Nephrology consulted for dialysis   ESRD on HD Patient on TTS schedule. Nephrology consulted for dialysis   Normocytic anemia Hemoglobin stable around 10.0   Peripheral vascular disease Patient with prior history of stent's -Discontinue Plavix while on anticoagulation due to increased risk of bleeding   Metastatic bladder cancer Patient's status post TURBT 07/22/2021.  CT imaging noting worsening metastatic disease with mets to the liver, lymph nodes, and lungs.   Patient appears to be hospice appropriate. Palliative care consulted.    Unfortunately patient has very poor prognosis. Palliative care consulted    DVT prophylaxis: Heparin drip Code Status: DNR Family Communication:  Called [name]- updated.   Ongoing treatment for PE, continue IV heparin. Palliative to discuss long-term goals of care  Subjective: Feels ok. Seen at HD no complaints.     Examination: Constitutional: Elderly frail. Chronically ill Respiratory: mild b/l rhonchi.  Cardiovascular: Normal sinus rhythm, no rubs Abdomen: Nontender nondistended good bowel sounds Musculoskeletal: No edema noted Skin: No rashes seen Neurologic: CN 2-12 grossly intact.  And nonfocal Psychiatric: Normal judgment and insight. Alert and oriented x 3. Normal mood.     Objective: Vitals:   06/08/22 0745 06/08/22 0746 06/08/22 0800 06/08/22 0828  BP: (!) 118/52  (!) 96/56 (!) 104/47  Pulse: 81 86 (!) 110 84  Resp: (!) '30 18 16 '$ (!) 24  Temp:  98.2 F (36.8 C)  98.4 F (36.9 C)  TempSrc:    Oral  SpO2: 94% 91% 90% 96%  Weight:      Height:        Intake/Output Summary (Last 24 hours) at 06/08/2022 0851 Last data filed at 06/08/2022 5277 Gross per 24 hour  Intake 188.83 ml  Output --  Net 188.83 ml   Filed Weights   06/07/22 1211  Weight: 59 kg     Data Reviewed:   CBC: Recent Labs  Lab 06/07/22 0012 06/08/22 0535  WBC 18.5* 20.6*  HGB 11.8* 10.7*  HCT 38.2 33.8*  MCV 90.5 89.7  PLT 426* 824   Basic Metabolic  Panel: Recent Labs  Lab 06/07/22 0012 06/08/22 0656  NA 135 132*  K 5.6* 5.3*  CL 91* 94*  CO2 29 24  GLUCOSE 138* 66*  BUN 43* 57*  CREATININE 5.62* 7.74*  CALCIUM 8.5* 8.2*  PHOS  --  4.1   GFR: Estimated Creatinine Clearance: 5.3 mL/min (A) (by C-G formula based on SCr of 7.74 mg/dL (H)). Liver Function Tests: Recent Labs  Lab 06/08/22 0656  ALBUMIN 1.7*   No results for input(s): "LIPASE", "AMYLASE" in the last 168 hours. No results for  input(s): "AMMONIA" in the last 168 hours. Coagulation Profile: No results for input(s): "INR", "PROTIME" in the last 168 hours. Cardiac Enzymes: No results for input(s): "CKTOTAL", "CKMB", "CKMBINDEX", "TROPONINI" in the last 168 hours. BNP (last 3 results) No results for input(s): "PROBNP" in the last 8760 hours. HbA1C: No results for input(s): "HGBA1C" in the last 72 hours. CBG: No results for input(s): "GLUCAP" in the last 168 hours. Lipid Profile: No results for input(s): "CHOL", "HDL", "LDLCALC", "TRIG", "CHOLHDL", "LDLDIRECT" in the last 72 hours. Thyroid Function Tests: No results for input(s): "TSH", "T4TOTAL", "FREET4", "T3FREE", "THYROIDAB" in the last 72 hours. Anemia Panel: No results for input(s): "VITAMINB12", "FOLATE", "FERRITIN", "TIBC", "IRON", "RETICCTPCT" in the last 72 hours. Sepsis Labs: No results for input(s): "PROCALCITON", "LATICACIDVEN" in the last 168 hours.  No results found for this or any previous visit (from the past 240 hour(s)).       Radiology Studies: ECHOCARDIOGRAM COMPLETE  Result Date: 06/07/2022    ECHOCARDIOGRAM REPORT   Patient Name:   Joanne Carlson Date of Exam: 06/07/2022 Medical Rec #:  [id number]        Height:       64.0 in Accession #:    [id number]       Weight:       130.0 lb Date of Birth:  [birth year]         BSA:          1.629 m Patient Age:    37 years         BP:           133/60 mmHg Patient Gender: F                HR:           95 bpm. Exam Location:  Inpatient Procedure: 2D Echo, Cardiac Doppler and Color Doppler Indications:    Pulmonary Embolus I26.09  History:        Patient has prior history of Echocardiogram examinations, most                 recent 11/17/2020. PAD and Stroke; Risk Factors:Dyslipidemia and                 Hypertension. End stage renal disease.  Sonographer:    Darlina Sicilian RDCS Referring Phys: Delbert Phenix SMITH IMPRESSIONS  1. Left ventricular ejection fraction, by estimation, is 65 to 70%. The left ventricle  has normal function. The left ventricle has no regional wall motion abnormalities. Left ventricular diastolic parameters are indeterminate.  2. Right ventricular systolic function is normal. The right ventricular size is normal.  3. The mitral valve is normal in structure. Trivial mitral valve regurgitation.  4. AV is thickened, calcified with restricted motion Peak and mean gradients through the valve are 29 and 14 mm Hg respectively AVA (VTI) is 1.21 cm2 Dimensionless index is 0.47 consistent wiht mild to moderate AS. Compared to echo  from 2022, mean gradient across valve is mildly increased (11 to 14 mm HG). The aortic valve is tricuspid. Aortic valve regurgitation is not visualized.  5. The inferior vena cava is normal in size with greater than 50% respiratory variability, suggesting right atrial pressure of 3 mmHg. FINDINGS  Left Ventricle: Left ventricular ejection fraction, by estimation, is 65 to 70%. The left ventricle has normal function. The left ventricle has no regional wall motion abnormalities. The left ventricular internal cavity size was normal in size. There is  no left ventricular hypertrophy. Left ventricular diastolic parameters are indeterminate. Right Ventricle: The right ventricular size is normal. Right vetricular wall thickness was not assessed. Right ventricular systolic function is normal. Left Atrium: Left atrial size was normal in size. Right Atrium: Right atrial size was normal in size. Pericardium: There is no evidence of pericardial effusion. Mitral Valve: The mitral valve is normal in structure. Trivial mitral valve regurgitation. Tricuspid Valve: The tricuspid valve is normal in structure. Tricuspid valve regurgitation is mild. Aortic Valve: AV is thickened, calcified with restricted motion Peak and mean gradients through the valve are 29 and 14 mm Hg respectively AVA (VTI) is 1.21 cm2 Dimensionless index is 0.47 consistent wiht mild to moderate AS. Compared to echo from 2022, mean  gradient across valve is mildly increased (11 to 14 mm HG). The aortic valve is tricuspid. Aortic valve regurgitation is not visualized. Aortic valve mean gradient measures 13.5 mmHg. Aortic valve peak gradient measures 25.2 mmHg. Aortic valve area,  by VTI measures 1.21 cm. Pulmonic Valve: The pulmonic valve was normal in structure. Pulmonic valve regurgitation is not visualized. Aorta: The aortic root and ascending aorta are structurally normal, with no evidence of dilitation. Venous: The inferior vena cava is normal in size with greater than 50% respiratory variability, suggesting right atrial pressure of 3 mmHg. IAS/Shunts: No atrial level shunt detected by color flow Doppler.  LEFT VENTRICLE PLAX 2D LVIDd:         4.10 cm   Diastology LVIDs:         2.50 cm   LV e' medial:    8.06 cm/s LV PW:         0.60 cm   LV E/e' medial:  17.2 LV IVS:        0.70 cm   LV e' lateral:   8.70 cm/s LVOT diam:     1.80 cm   LV E/e' lateral: 16.0 LV SV:         68 LV SV Index:   42 LVOT Area:     2.54 cm  RIGHT VENTRICLE TAPSE (M-mode): 2.1 cm LEFT ATRIUM             Index        RIGHT ATRIUM           Index LA diam:        3.80 cm 2.33 cm/m   RA Area:     15.50 cm LA Vol (A2C):   44.9 ml 27.56 ml/m  RA Volume:   38.10 ml  23.39 ml/m LA Vol (A4C):   45.5 ml 27.93 ml/m LA Biplane Vol: 47.1 ml 28.91 ml/m  AORTIC VALVE AV Area (Vmax):    1.23 cm AV Area (Vmean):   1.22 cm AV Area (VTI):     1.21 cm AV Vmax:           251.00 cm/s AV Vmean:          172.500 cm/s AV VTI:  0.563 m AV Peak Grad:      25.2 mmHg AV Mean Grad:      13.5 mmHg LVOT Vmax:         121.00 cm/s LVOT Vmean:        83.000 cm/s LVOT VTI:          0.267 m LVOT/AV VTI ratio: 0.47  AORTA Ao Root diam: 2.60 cm Ao Asc diam:  2.60 cm MITRAL VALVE                TRICUSPID VALVE MV Area (PHT): 3.97 cm     TR Peak grad:   33.2 mmHg MV Decel Time: 191 msec     TR Vmax:        288.00 cm/s MV E velocity: 139.00 cm/s MV A velocity: 126.00 cm/s  SHUNTS MV  E/A ratio:  1.10         Systemic VTI:  0.27 m                             Systemic Diam: 1.80 cm Dorris Carnes MD Electronically signed by Dorris Carnes MD Signature Date/Time: 06/07/2022/4:42:06 PM    Final    VAS [place] LOWER EXTREMITY VENOUS (DVT)  Result Date: 06/07/2022  Lower Venous DVT Study Patient Name:  Joanne Carlson  Date of Exam:   06/07/2022 Medical Rec #: [id number]         Accession #:    [id number] Date of Birth: August 26, 1945          Patient Gender: F Patient Age:   35 years Exam Location:  [facility] Procedure:      VAS [place] LOWER EXTREMITY VENOUS (DVT) Referring Phys: Fuller Plan --------------------------------------------------------------------------------  Indications: Pulmonary embolism.  Risk Factors: Confirmed PE None identified. Anticoagulation: Heparin. Limitations: Poor ultrasound/tissue interface. Comparison Study: No prior studies. Performing Technologist: Oliver Hum RVT  Examination Guidelines: A complete evaluation includes B-mode imaging, spectral Doppler, color Doppler, and power Doppler as needed of all accessible portions of each vessel. Bilateral testing is considered an integral part of a complete examination. Limited examinations for reoccurring indications may be performed as noted. The reflux portion of the exam is performed with the patient in reverse Trendelenburg.  +---------+---------------+---------+-----------+----------+--------------+ RIGHT    CompressibilityPhasicitySpontaneityPropertiesThrombus Aging +---------+---------------+---------+-----------+----------+--------------+ CFV      Partial        Yes      No                   Acute          +---------+---------------+---------+-----------+----------+--------------+ SFJ      Full                                                        +---------+---------------+---------+-----------+----------+--------------+ FV Prox  Full                                                         +---------+---------------+---------+-----------+----------+--------------+ FV Mid   Full                                                        +---------+---------------+---------+-----------+----------+--------------+  FV Distal               Yes      Yes                                 +---------+---------------+---------+-----------+----------+--------------+ PFV      Partial        Yes      No                   Acute          +---------+---------------+---------+-----------+----------+--------------+ POP      Full           Yes      No                                  +---------+---------------+---------+-----------+----------+--------------+ PTV      Full                                                        +---------+---------------+---------+-----------+----------+--------------+ PERO     Full                                                        +---------+---------------+---------+-----------+----------+--------------+ EIV                     Yes      No                                  +---------+---------------+---------+-----------+----------+--------------+ CIV                     Yes      No                                  +---------+---------------+---------+-----------+----------+--------------+   +---------+---------------+---------+-----------+----------+--------------+ LEFT     CompressibilityPhasicitySpontaneityPropertiesThrombus Aging +---------+---------------+---------+-----------+----------+--------------+ CFV      Full           Yes      No                                  +---------+---------------+---------+-----------+----------+--------------+ SFJ      Full                                                        +---------+---------------+---------+-----------+----------+--------------+ FV Prox  Full                                                         +---------+---------------+---------+-----------+----------+--------------+  FV Mid                  Yes      No                                  +---------+---------------+---------+-----------+----------+--------------+ FV DistalFull           Yes      No                                  +---------+---------------+---------+-----------+----------+--------------+ PFV      Full                                                        +---------+---------------+---------+-----------+----------+--------------+ POP      Full           Yes      No                                  +---------+---------------+---------+-----------+----------+--------------+ PTV      Full                                                        +---------+---------------+---------+-----------+----------+--------------+ PERO     Full                                                        +---------+---------------+---------+-----------+----------+--------------+     Summary: RIGHT: - Findings consistent with acute deep vein thrombosis involving the right common femoral vein, and right proximal profunda vein. - No cystic structure found in the popliteal fossa.  LEFT: - There is no evidence of deep vein thrombosis in the lower extremity.  - No cystic structure found in the popliteal fossa.  *See table(s) above for measurements and observations. Electronically signed by Servando Snare MD on 06/07/2022 at 4:37:37 PM.    Final    CT ABDOMEN PELVIS W CONTRAST  Result Date: 06/07/2022 CLINICAL DATA:  Abdominal pain. History of bladder cancer. * Tracking Code: BO * EXAM: CT ABDOMEN AND PELVIS WITH CONTRAST TECHNIQUE: Multidetector CT imaging of the abdomen and pelvis was performed using the standard protocol following bolus administration of intravenous contrast. RADIATION DOSE REDUCTION: This exam was performed according to the departmental dose-optimization program which includes automated exposure control,  adjustment of the mA and/or kV according to patient size and/or use of iterative reconstruction technique. CONTRAST:  97m OMNIPAQUE IOHEXOL 350 MG/ML SOLN COMPARISON:  01/13/2022 FINDINGS: Lower chest: Please see report for chest CTA performed at the same time and dictated separately. Hepatobiliary: Interval development of innumerable hypoenhancing liver lesions ranging in size from several mm up to about 13 mm maximum size. Imaging features compatible with metastatic disease. Gallbladder is distended with layering tiny stones or sludge towards the fundus. No intrahepatic or  extrahepatic biliary dilation. Pancreas: No focal mass lesion. No dilatation of the main duct. No intraparenchymal cyst. No peripancreatic edema. Spleen: No splenomegaly. No focal mass lesion. Adrenals/Urinary Tract: No adrenal nodule or mass. Right kidney surgically absent. Stable 2.6 cm simple cyst interpolar left kidney with left moderate hydroureteronephrosis. Left ureter is dilated down to the UVJ region. As on the prior study, there is a large heterogeneously enhancing mass lesion occupying the location of the bladder. This was measured previously at 8.9 x 8.3 cm which compares 2 8.3 x 8.3 cm today when measured in a similar fashion. Stomach/Bowel: Stomach is unremarkable. No gastric wall thickening. No evidence of outlet obstruction. Duodenum is normally positioned as is the ligament of Treitz. No small bowel wall thickening. No small bowel dilatation. The terminal ileum is normal. The appendix is not well visualized, but there is no edema or inflammation in the region of the cecum. No gross colonic mass. No colonic wall thickening. Vascular/Lymphatic: There is advanced atherosclerotic calcification of the abdominal aorta without aneurysm. There is no gastrohepatic or hepatoduodenal ligament lymphadenopathy. 10 mm short axis retrocaval lymph node on 35/3 is new in the interval. 10 mm short axis left para-aortic lymph node on 45/3 is new  since prior. 15 mm short axis right external iliac node on 67/3 was 7 mm short axis previously. 16 mm short axis left external iliac node on 65/3 was 5-6 mm previously. Reproductive: There is no adnexal mass. Other: No substantial intraperitoneal free fluid. Musculoskeletal: Small umbilical hernia contains only fat. No worrisome lytic or sclerotic osseous abnormality. IMPRESSION: 1. Interval development of innumerable hypoenhancing liver lesions ranging in size from several mm up to about 13 mm maximum size. Imaging features compatible with metastatic disease. 2. Interval progression of retroperitoneal and bilateral external iliac lymphadenopathy, consistent with metastatic disease. 3. Similar appearance of a large heterogeneously enhancing mass lesion occupying the location of the bladder. Moderate left hydroureteronephrosis with left ureter dilated down to the UVJ region, similar to prior. 4. Status post right nephrectomy. 5. Small umbilical hernia contains only fat. 6.  Aortic Atherosclerosis (ICD10-I70.0). Electronically Signed   By: Misty Stanley M.D.   On: 06/07/2022 11:16   CT Angio Chest Pulmonary Embolism (PE) W or WO Contrast  Result Date: 06/07/2022 CLINICAL DATA:  PE suspected EXAM: CT ANGIOGRAPHY CHEST WITH CONTRAST TECHNIQUE: Multidetector CT imaging of the chest was performed using the standard protocol during bolus administration of intravenous contrast. Multiplanar CT image reconstructions and MIPs were obtained to evaluate the vascular anatomy. RADIATION DOSE REDUCTION: This exam was performed according to the departmental dose-optimization program which includes automated exposure control, adjustment of the mA and/or kV according to patient size and/or use of iterative reconstruction technique. CONTRAST:  IV contrast was used to improve disease detection. COMPARISON:  CT AP 01/13/22 FINDINGS: Cardiovascular: Satisfactory opacification of the pulmonary arteries to the segmental level. There is  an acute PE in a right lower lobe segmental pulmonary artery (series 6, image 146). RV/LV ratio >1. No pericardial effusion. Mediastinum/Nodes: No enlarged mediastinal, or axillary lymph nodes. There is a enlarged 1.6 cm right hilar lymph node (series 6, image 125). Thyroid gland, trachea, and esophagus demonstrate no significant findings. Lungs/Pleura: No pleural effusion. No pneumothorax. Bibasilar atelectasis. 12Multiple bilateral pulmonary nodules, which are either new or enlarged compared to prior CT abdomen and pelvis dated 01/14/2019. Index nodules include a 4 mm nodule in the right upper lobe (series 6, image 67), a 6 mm nodule in the right upper  lobe (series 6, image 96), a 1.2 cm perifissural right lower lobe pulmonary nodule (series 6, image 99), a 1.4 cm left lower lobe pulmonary nodule (series 6, image 133), a 1.3 cm right lower lobe pulmonary nodule (series 6, image 160). Upper Abdomen: The see separately dictated CT abdomen and pelvis for additional findings Musculoskeletal: There is a 1.9 x 1.9 cm subcutaneous hypodense lesion in the right axilla (series 6, image 25). Review of the MIP images confirms the above findings. IMPRESSION: 1. Acute PE in a right lower lobe segmental pulmonary artery. RV/LV ratio is greater than 1, which is suggestive of right heart strain. 2. Multiple bilateral pulmonary nodules, which are either new or enlarged compared to prior CT abdomen and pelvis dated 01/13/2022, concerning for worsening pulmonary metastatic disease. 3. Enlarged right hilar lymph node, also concerning for metastatic disease. 4. Indeterminate 1.9 cm subcutaneous hypodense lesion in the right axilla, nonspecific. Recommend correlation with physical exam. 5. Please see same day CT AP for additional findings. Electronically Signed   By: Marin Roberts M.D.   On: 06/07/2022 11:16   DG Chest 2 View  Result Date: 06/07/2022 CLINICAL DATA:  Chest pain. EXAM: CHEST - 2 VIEW COMPARISON:  January 21, 2016  FINDINGS: The heart size and mediastinal contours are within normal limits. There is marked severity calcification of the aortic arch. Low lung volumes are noted. Mild atelectasis is seen within the bilateral lung bases. There is no evidence of focal consolidation, pleural effusion or pneumothorax. The visualized skeletal structures are unremarkable. IMPRESSION: Low lung volumes with mild bibasilar atelectasis. Electronically Signed   By: Virgina Norfolk M.D.   On: 06/07/2022 00:59        Scheduled Meds:  (feeding supplement) PROSource Plus  30 mL Oral BID BM   atorvastatin  80 mg Oral QPM   cinacalcet  30 mg Oral Q supper   lidocaine-prilocaine  1 Application Topical Q T,Th,Sa-HD   multivitamin  1 tablet Oral Daily   sodium chloride flush  3 mL Intravenous Q12H   sodium zirconium cyclosilicate  10 g Oral Once   sucroferric oxyhydroxide  1,000 mg Oral TID WC   Continuous Infusions:  anticoagulant sodium citrate     heparin 1,350 Units/hr (06/08/22 0631)     LOS: 0 days   Time spent= 35 mins    Skylar Flynt Arsenio Loader, MD [facility]  If 7PM-7AM, please contact night-coverage  06/08/2022, 8:51 AM

## 2022-06-08 NOTE — Progress Notes (Signed)
[facility] Progress Note   Subjective:  Seen at onset of HD - 2L UF planned. Reports L sided chest pain slightly improved after taking pain pill this AM. On heparin drip.  Objective Vitals:   06/08/22 0700 06/08/22 0745 06/08/22 0746 06/08/22 0800  BP: (!) 106/50 (!) 118/52  (!) 96/56  Pulse: 81 81 86 (!) 110  Resp: 18 (!) '30 18 16  '$ Temp:   98.2 F (36.8 C)   TempSrc:      SpO2: 100% 94% 91% 90%  Weight:      Height:       Physical Exam General: Well appearing woman, NAD. Nasal O2 Heart:RRR; 2/6 murmur Lungs: CTAB; no wheezing Abdomen: soft Extremities: 1+ BLE edema Dialysis Access: RUE AVF + bruit  Additional Objective Labs: Basic Metabolic Panel: Recent Labs  Lab 06/07/22 0012 06/08/22 0656  NA 135 132*  K 5.6* 5.3*  CL 91* 94*  CO2 29 24  GLUCOSE 138* 66*  BUN 43* 57*  CREATININE 5.62* 7.74*  CALCIUM 8.5* 8.2*  PHOS  --  4.1   Liver Function Tests: Recent Labs  Lab 06/08/22 0656  ALBUMIN 1.7*   CBC: Recent Labs  Lab 06/07/22 0012 06/08/22 0535  WBC 18.5* 20.6*  HGB 11.8* 10.7*  HCT 38.2 33.8*  MCV 90.5 89.7  PLT 426* 372   Studies/Results: ECHOCARDIOGRAM COMPLETE  Result Date: 06/07/2022    ECHOCARDIOGRAM REPORT   Patient Name:   DANYLA WATTLEY Date of Exam: 06/07/2022 Medical Rec #:  [id number]        Height:       64.0 in Accession #:    [id number]       Weight:       130.0 lb Date of Birth:  1946-02-06         BSA:          1.629 m Patient Age:    76 years         BP:           133/60 mmHg Patient Gender: F                HR:           95 bpm. Exam Location:  Inpatient Procedure: 2D Echo, Cardiac Doppler and Color Doppler Indications:    Pulmonary Embolus I26.09  History:        Patient has prior history of Echocardiogram examinations, most                 recent 11/17/2020. PAD and Stroke; Risk Factors:Dyslipidemia and                 Hypertension. End stage renal disease.  Sonographer:    Darlina Sicilian RDCS Referring Phys:  Delbert Phenix SMITH IMPRESSIONS  1. Left ventricular ejection fraction, by estimation, is 65 to 70%. The left ventricle has normal function. The left ventricle has no regional wall motion abnormalities. Left ventricular diastolic parameters are indeterminate.  2. Right ventricular systolic function is normal. The right ventricular size is normal.  3. The mitral valve is normal in structure. Trivial mitral valve regurgitation.  4. AV is thickened, calcified with restricted motion Peak and mean gradients through the valve are 29 and 14 mm Hg respectively AVA (VTI) is 1.21 cm2 Dimensionless index is 0.47 consistent wiht mild to moderate AS. Compared to echo from 2022, mean gradient across valve is mildly increased (11 to 14 mm HG). The aortic valve is tricuspid.  Aortic valve regurgitation is not visualized.  5. The inferior vena cava is normal in size with greater than 50% respiratory variability, suggesting right atrial pressure of 3 mmHg. FINDINGS  Left Ventricle: Left ventricular ejection fraction, by estimation, is 65 to 70%. The left ventricle has normal function. The left ventricle has no regional wall motion abnormalities. The left ventricular internal cavity size was normal in size. There is  no left ventricular hypertrophy. Left ventricular diastolic parameters are indeterminate. Right Ventricle: The right ventricular size is normal. Right vetricular wall thickness was not assessed. Right ventricular systolic function is normal. Left Atrium: Left atrial size was normal in size. Right Atrium: Right atrial size was normal in size. Pericardium: There is no evidence of pericardial effusion. Mitral Valve: The mitral valve is normal in structure. Trivial mitral valve regurgitation. Tricuspid Valve: The tricuspid valve is normal in structure. Tricuspid valve regurgitation is mild. Aortic Valve: AV is thickened, calcified with restricted motion Peak and mean gradients through the valve are 29 and 14 mm Hg respectively AVA  (VTI) is 1.21 cm2 Dimensionless index is 0.47 consistent wiht mild to moderate AS. Compared to echo from 2022, mean gradient across valve is mildly increased (11 to 14 mm HG). The aortic valve is tricuspid. Aortic valve regurgitation is not visualized. Aortic valve mean gradient measures 13.5 mmHg. Aortic valve peak gradient measures 25.2 mmHg. Aortic valve area,  by VTI measures 1.21 cm. Pulmonic Valve: The pulmonic valve was normal in structure. Pulmonic valve regurgitation is not visualized. Aorta: The aortic root and ascending aorta are structurally normal, with no evidence of dilitation. Venous: The inferior vena cava is normal in size with greater than 50% respiratory variability, suggesting right atrial pressure of 3 mmHg. IAS/Shunts: No atrial level shunt detected by color flow Doppler.  LEFT VENTRICLE PLAX 2D LVIDd:         4.10 cm   Diastology LVIDs:         2.50 cm   LV e' medial:    8.06 cm/s LV PW:         0.60 cm   LV E/e' medial:  17.2 LV IVS:        0.70 cm   LV e' lateral:   8.70 cm/s LVOT diam:     1.80 cm   LV E/e' lateral: 16.0 LV SV:         68 LV SV Index:   42 LVOT Area:     2.54 cm  RIGHT VENTRICLE TAPSE (M-mode): 2.1 cm LEFT ATRIUM             Index        RIGHT ATRIUM           Index LA diam:        3.80 cm 2.33 cm/m   RA Area:     15.50 cm LA Vol (A2C):   44.9 ml 27.56 ml/m  RA Volume:   38.10 ml  23.39 ml/m LA Vol (A4C):   45.5 ml 27.93 ml/m LA Biplane Vol: 47.1 ml 28.91 ml/m  AORTIC VALVE AV Area (Vmax):    1.23 cm AV Area (Vmean):   1.22 cm AV Area (VTI):     1.21 cm AV Vmax:           251.00 cm/s AV Vmean:          172.500 cm/s AV VTI:            0.563 m AV Peak Grad:  25.2 mmHg AV Mean Grad:      13.5 mmHg LVOT Vmax:         121.00 cm/s LVOT Vmean:        83.000 cm/s LVOT VTI:          0.267 m LVOT/AV VTI ratio: 0.47  AORTA Ao Root diam: 2.60 cm Ao Asc diam:  2.60 cm MITRAL VALVE                TRICUSPID VALVE MV Area (PHT): 3.97 cm     TR Peak grad:   33.2 mmHg MV  Decel Time: 191 msec     TR Vmax:        288.00 cm/s MV E velocity: 139.00 cm/s MV A velocity: 126.00 cm/s  SHUNTS MV E/A ratio:  1.10         Systemic VTI:  0.27 m                             Systemic Diam: 1.80 cm Dorris Carnes MD Electronically signed by Dorris Carnes MD Signature Date/Time: 06/07/2022/4:42:06 PM    Final    VAS [place] LOWER EXTREMITY VENOUS (DVT)  Result Date: 06/07/2022  Lower Venous DVT Study Patient Name:  AQUARIUS TREMPER [place]  Date of Exam:   06/07/2022 Medical Rec #: [id number]         Accession #:    [id number] Date of Birth: 1945-10-29          Patient Gender: F Patient Age:   66 years Exam Location:  [facility] Procedure:      VAS [place] LOWER EXTREMITY VENOUS (DVT) Referring Phys: Fuller Plan --------------------------------------------------------------------------------  Indications: Pulmonary embolism.  Risk Factors: Confirmed PE None identified. Anticoagulation: Heparin. Limitations: Poor ultrasound/tissue interface. Comparison Study: No prior studies. Performing Technologist: Oliver Hum RVT  Examination Guidelines: A complete evaluation includes B-mode imaging, spectral Doppler, color Doppler, and power Doppler as needed of all accessible portions of each vessel. Bilateral testing is considered an integral part of a complete examination. Limited examinations for reoccurring indications may be performed as noted. The reflux portion of the exam is performed with the patient in reverse Trendelenburg.  +---------+---------------+---------+-----------+----------+--------------+ RIGHT    CompressibilityPhasicitySpontaneityPropertiesThrombus Aging +---------+---------------+---------+-----------+----------+--------------+ CFV      Partial        Yes      No                   Acute          +---------+---------------+---------+-----------+----------+--------------+ SFJ      Full                                                         +---------+---------------+---------+-----------+----------+--------------+ FV Prox  Full                                                        +---------+---------------+---------+-----------+----------+--------------+ FV Mid   Full                                                        +---------+---------------+---------+-----------+----------+--------------+  FV Distal               Yes      Yes                                 +---------+---------------+---------+-----------+----------+--------------+ PFV      Partial        Yes      No                   Acute          +---------+---------------+---------+-----------+----------+--------------+ POP      Full           Yes      No                                  +---------+---------------+---------+-----------+----------+--------------+ PTV      Full                                                        +---------+---------------+---------+-----------+----------+--------------+ PERO     Full                                                        +---------+---------------+---------+-----------+----------+--------------+ EIV                     Yes      No                                  +---------+---------------+---------+-----------+----------+--------------+ CIV                     Yes      No                                  +---------+---------------+---------+-----------+----------+--------------+   +---------+---------------+---------+-----------+----------+--------------+ LEFT     CompressibilityPhasicitySpontaneityPropertiesThrombus Aging +---------+---------------+---------+-----------+----------+--------------+ CFV      Full           Yes      No                                  +---------+---------------+---------+-----------+----------+--------------+ SFJ      Full                                                         +---------+---------------+---------+-----------+----------+--------------+ FV Prox  Full                                                        +---------+---------------+---------+-----------+----------+--------------+  FV Mid                  Yes      No                                  +---------+---------------+---------+-----------+----------+--------------+ FV DistalFull           Yes      No                                  +---------+---------------+---------+-----------+----------+--------------+ PFV      Full                                                        +---------+---------------+---------+-----------+----------+--------------+ POP      Full           Yes      No                                  +---------+---------------+---------+-----------+----------+--------------+ PTV      Full                                                        +---------+---------------+---------+-----------+----------+--------------+ PERO     Full                                                        +---------+---------------+---------+-----------+----------+--------------+     Summary: RIGHT: - Findings consistent with acute deep vein thrombosis involving the right common femoral vein, and right proximal profunda vein. - No cystic structure found in the popliteal fossa.  LEFT: - There is no evidence of deep vein thrombosis in the lower extremity.  - No cystic structure found in the popliteal fossa.  *See table(s) above for measurements and observations. Electronically signed by Servando Snare MD on 06/07/2022 at 4:37:37 PM.    Final    CT ABDOMEN PELVIS W CONTRAST  Result Date: 06/07/2022 CLINICAL DATA:  Abdominal pain. History of bladder cancer. * Tracking Code: BO * EXAM: CT ABDOMEN AND PELVIS WITH CONTRAST TECHNIQUE: Multidetector CT imaging of the abdomen and pelvis was performed using the standard protocol following bolus administration of intravenous contrast.  RADIATION DOSE REDUCTION: This exam was performed according to the departmental dose-optimization program which includes automated exposure control, adjustment of the mA and/or kV according to patient size and/or use of iterative reconstruction technique. CONTRAST:  38m OMNIPAQUE IOHEXOL 350 MG/ML SOLN COMPARISON:  01/13/2022 FINDINGS: Lower chest: Please see report for chest CTA performed at the same time and dictated separately. Hepatobiliary: Interval development of innumerable hypoenhancing liver lesions ranging in size from several mm up to about 13 mm maximum size. Imaging features compatible with metastatic disease. Gallbladder is distended with layering tiny stones or sludge towards the fundus. No intrahepatic or  extrahepatic biliary dilation. Pancreas: No focal mass lesion. No dilatation of the main duct. No intraparenchymal cyst. No peripancreatic edema. Spleen: No splenomegaly. No focal mass lesion. Adrenals/Urinary Tract: No adrenal nodule or mass. Right kidney surgically absent. Stable 2.6 cm simple cyst interpolar left kidney with left moderate hydroureteronephrosis. Left ureter is dilated down to the UVJ region. As on the prior study, there is a large heterogeneously enhancing mass lesion occupying the location of the bladder. This was measured previously at 8.9 x 8.3 cm which compares 2 8.3 x 8.3 cm today when measured in a similar fashion. Stomach/Bowel: Stomach is unremarkable. No gastric wall thickening. No evidence of outlet obstruction. Duodenum is normally positioned as is the ligament of Treitz. No small bowel wall thickening. No small bowel dilatation. The terminal ileum is normal. The appendix is not well visualized, but there is no edema or inflammation in the region of the cecum. No gross colonic mass. No colonic wall thickening. Vascular/Lymphatic: There is advanced atherosclerotic calcification of the abdominal aorta without aneurysm. There is no gastrohepatic or hepatoduodenal ligament  lymphadenopathy. 10 mm short axis retrocaval lymph node on 35/3 is new in the interval. 10 mm short axis left para-aortic lymph node on 45/3 is new since prior. 15 mm short axis right external iliac node on 67/3 was 7 mm short axis previously. 16 mm short axis left external iliac node on 65/3 was 5-6 mm previously. Reproductive: There is no adnexal mass. Other: No substantial intraperitoneal free fluid. Musculoskeletal: Small umbilical hernia contains only fat. No worrisome lytic or sclerotic osseous abnormality. IMPRESSION: 1. Interval development of innumerable hypoenhancing liver lesions ranging in size from several mm up to about 13 mm maximum size. Imaging features compatible with metastatic disease. 2. Interval progression of retroperitoneal and bilateral external iliac lymphadenopathy, consistent with metastatic disease. 3. Similar appearance of a large heterogeneously enhancing mass lesion occupying the location of the bladder. Moderate left hydroureteronephrosis with left ureter dilated down to the UVJ region, similar to prior. 4. Status post right nephrectomy. 5. Small umbilical hernia contains only fat. 6.  Aortic Atherosclerosis (ICD10-I70.0). Electronically Signed   By: Misty Stanley M.D.   On: 06/07/2022 11:16   CT Angio Chest Pulmonary Embolism (PE) W or WO Contrast  Result Date: 06/07/2022 CLINICAL DATA:  PE suspected EXAM: CT ANGIOGRAPHY CHEST WITH CONTRAST TECHNIQUE: Multidetector CT imaging of the chest was performed using the standard protocol during bolus administration of intravenous contrast. Multiplanar CT image reconstructions and MIPs were obtained to evaluate the vascular anatomy. RADIATION DOSE REDUCTION: This exam was performed according to the departmental dose-optimization program which includes automated exposure control, adjustment of the mA and/or kV according to patient size and/or use of iterative reconstruction technique. CONTRAST:  IV contrast was used to improve disease  detection. COMPARISON:  CT AP 01/13/22 FINDINGS: Cardiovascular: Satisfactory opacification of the pulmonary arteries to the segmental level. There is an acute PE in a right lower lobe segmental pulmonary artery (series 6, image 146). RV/LV ratio >1. No pericardial effusion. Mediastinum/Nodes: No enlarged mediastinal, or axillary lymph nodes. There is a enlarged 1.6 cm right hilar lymph node (series 6, image 125). Thyroid gland, trachea, and esophagus demonstrate no significant findings. Lungs/Pleura: No pleural effusion. No pneumothorax. Bibasilar atelectasis. 12Multiple bilateral pulmonary nodules, which are either new or enlarged compared to prior CT abdomen and pelvis dated 01/14/2019. Index nodules include a 4 mm nodule in the right upper lobe (series 6, image 67), a 6 mm nodule in the right upper  lobe (series 6, image 96), a 1.2 cm perifissural right lower lobe pulmonary nodule (series 6, image 99), a 1.4 cm left lower lobe pulmonary nodule (series 6, image 133), a 1.3 cm right lower lobe pulmonary nodule (series 6, image 160). Upper Abdomen: The see separately dictated CT abdomen and pelvis for additional findings Musculoskeletal: There is a 1.9 x 1.9 cm subcutaneous hypodense lesion in the right axilla (series 6, image 25). Review of the MIP images confirms the above findings. IMPRESSION: 1. Acute PE in a right lower lobe segmental pulmonary artery. RV/LV ratio is greater than 1, which is suggestive of right heart strain. 2. Multiple bilateral pulmonary nodules, which are either new or enlarged compared to prior CT abdomen and pelvis dated 01/13/2022, concerning for worsening pulmonary metastatic disease. 3. Enlarged right hilar lymph node, also concerning for metastatic disease. 4. Indeterminate 1.9 cm subcutaneous hypodense lesion in the right axilla, nonspecific. Recommend correlation with physical exam. 5. Please see same day CT AP for additional findings. Electronically Signed   By: Marin Roberts M.D.    On: 06/07/2022 11:16   DG Chest 2 View  Result Date: 06/07/2022 CLINICAL DATA:  Chest pain. EXAM: CHEST - 2 VIEW COMPARISON:  January 21, 2016 FINDINGS: The heart size and mediastinal contours are within normal limits. There is marked severity calcification of the aortic arch. Low lung volumes are noted. Mild atelectasis is seen within the bilateral lung bases. There is no evidence of focal consolidation, pleural effusion or pneumothorax. The visualized skeletal structures are unremarkable. IMPRESSION: Low lung volumes with mild bibasilar atelectasis. Electronically Signed   By: Virgina Norfolk M.D.   On: 06/07/2022 00:59   Medications:  heparin 1,350 Units/hr (06/08/22 0631)    atorvastatin  80 mg Oral QPM   cinacalcet  30 mg Oral Q supper   lidocaine-prilocaine  1 Application Topical Q T,Th,Sa-HD   multivitamin  1 tablet Oral Daily   sodium chloride flush  3 mL Intravenous Q12H   sodium zirconium cyclosilicate  10 g Oral Once   sucroferric oxyhydroxide  1,000 mg Oral TID WC    Dialysis Orders: TTS at [facility] 3:30hr, 350/600, EDW 56.7kg, 2K/2.5Ca, AVF, heparin 7000unit bolus - Mircera on hold - last given 261mg on 11/9 - Hectoral 124m IV q HD   Assessment/Plan:  Pulmonary embolus: Risk factor is her bladder cancer. LS doppler USKoreahowed acte DVT to R common femoral and R proximal profunda veins. Echo with normal LV and RV function. On heparin drip - further AC per primary.  ESRD:  Continue HD per usual TTS schedule - HD now, 2K bath, 2L UFG.  Hypertension/volume: BP low/stable, + edema in BLE. Uses midodrine pre-HD.  Anemia: Hgb 10.7 - no ESA needs at this time.  Metabolic bone disease: Ca/Phos to goal. Continue Velphoro + sensipar.  Nutrition: Alb low, adding supplements.  Metastatic bladder cancer: Per daughter - running out of treatment options.  GOC: Appreciate palliative care assistance - now DNR, but wishes to continue HD for now.  KaVeneta PentonPA-C 06/08/2022, 8:36 AM   [employer]

## 2022-06-08 NOTE — Progress Notes (Signed)
ANTICOAGULATION CONSULT NOTE  Pharmacy Consult for Heparin Indication: pulmonary embolus  Allergies  Allergen Reactions   Blood-Group Specific Substance    Tylenol [Acetaminophen] Rash    Patient Measurements: Height: '5\' 4"'$  (162.6 cm) Weight: 59 kg (130 lb) IBW/kg (Calculated) : 54.7 Heparin Dosing Weight: 59kg   Vital Signs: Temp: 98.3 F (36.8 C) (12/05 0432) Temp Source: Oral (12/05 0432) BP: 104/42 (12/05 0510) Pulse Rate: 92 (12/05 0510)  Labs: Recent Labs    06/07/22 0012 06/07/22 0618 06/07/22 2117 06/08/22 0535  HGB 11.8*  --   --  10.7*  HCT 38.2  --   --  33.8*  PLT 426*  --   --  372  HEPARINUNFRC  --   --  <0.10* <0.10*  CREATININE 5.62*  --   --   --   TROPONINIHS 9 13  --   --      Estimated Creatinine Clearance: 7.2 mL/min (A) (by C-G formula based on SCr of 5.62 mg/dL (H)).   Medical History: Past Medical History:  Diagnosis Date   Arthritis    "legs, knees; not bad" (01/30/2018)   Diabetic foot infection ([place]) 10/02/2021   Dizziness    occasionally   ESRD (end stage renal disease) ([place])    [name]; TTS" (01/30/2018)   History of colon polyps    History of gout    takes Allopurinol daily (01/30/2018)   Hyperlipidemia    takes Pravastatin daily   Hypertension    takes Monopril daily   Joint pain    PAD (peripheral artery disease) ([place])    Peripheral neuropathy    Peripheral vascular disease ([place])    Pneumonia    Pre-diabetes    Refusal of blood transfusions as patient is Jehovah's Witness    NO BLOOD OR BLOOD PRODUCTS. Albumin okay.    Stroke [facility]) 1990s   "mini stroke; left lower mouth a little bit twisted since" (01/30/2018)     Assessment: 76 yo female presented on 06/06/2022 for chest pain. Pharmacy consulted for heparin for concern for VTE. PMH of metastatic bladder cancer and ESRD on iHD TTS. CT obtained and found to have acute PE with RHS. Patient with hematuria at baseline and currently ongoing.    Heparin level.remains  undetectable s/p bolus and rate increase to 1150 units/hr, no issues with infusion or hematuria per RN  Goal of Therapy:  Heparin level 0.3-0.7 units/ml Monitor platelets by anticoagulation protocol: Yes   Plan:  Heparin bolus 2000 units IV x 1, then increase gtt to 1350 units/hr F/u 8 hour heparin level vs transition to [place], PharmD, [place] Pharmacist ED Pharmacist Phone # [phone] 06/08/2022 6:23 AM

## 2022-06-08 NOTE — ED Notes (Signed)
Pt changed, fresh sheets, teeth brushed, pericare performed.

## 2022-06-08 NOTE — ED Notes (Addendum)
Transport here to take to dialysis at this time. Pt in no distress eating breakfast. Patient placement notified pt to dialysis.

## 2022-06-08 NOTE — Progress Notes (Signed)
Joanne Carlson for Heparin Indication: pulmonary embolus  Allergies  Allergen Reactions   Blood-Group Specific Substance    Tylenol [Acetaminophen] Rash    Patient Measurements: Height: '5\' 4"'$  (162.6 cm) Weight:  (Unable to obtain WT) IBW/kg (Calculated) : 54.7 Heparin Dosing Weight: 59kg   Vital Signs: Temp: 98.1 F (36.7 C) (12/05 1900) Temp Source: Oral (12/05 1900) BP: 99/50 (12/05 1824) Pulse Rate: 75 (12/05 1824)  Labs: Recent Labs    06/07/22 0012 06/07/22 0618 06/07/22 2117 06/08/22 0535 06/08/22 0656 06/08/22 1715  HGB 11.8*  --   --  10.7*  --   --   HCT 38.2  --   --  33.8*  --   --   PLT 426*  --   --  372  --   --   HEPARINUNFRC  --   --  <0.10* <0.10*  --  0.12*  CREATININE 5.62*  --   --   --  7.74*  --   TROPONINIHS 9 13  --   --   --   --      Estimated Creatinine Clearance: 5.3 mL/min (A) (by C-G formula based on SCr of 7.74 mg/dL (H)).   Medical History: Past Medical History:  Diagnosis Date   Arthritis    "legs, knees; not bad" (01/30/2018)   Diabetic foot infection ([place]) 10/02/2021   Dizziness    occasionally   ESRD (end stage renal disease) ([place])    [name]; TTS" (01/30/2018)   History of colon polyps    History of gout    takes Allopurinol daily (01/30/2018)   Hyperlipidemia    takes Pravastatin daily   Hypertension    takes Monopril daily   Joint pain    PAD (peripheral artery disease) ([place])    Peripheral neuropathy    Peripheral vascular disease ([place])    Pneumonia    Pre-diabetes    Refusal of blood transfusions as patient is Jehovah's Witness    NO BLOOD OR BLOOD PRODUCTS. Albumin okay.    Stroke [facility]) 1990s   "mini stroke; left lower mouth a little bit twisted since" (01/30/2018)     Assessment: 76 yo female presented on 06/06/2022 for chest pain. Pharmacy consulted for heparin for concern for VTE. PMH of metastatic bladder cancer and ESRD on iHD TTS. CT obtained and found to have acute  PE with RHS. Patient with hematuria at baseline and currently ongoing.    Heparin level 0.12 (subtherapeutic) while on heparin 1350 units/hr. RN reports ongoing hematuria, although not overtly worse from baseline since starting heparin.  Discussed with Dr. Reesa Chew given persistent subtherapeutic levels and active clot - plan to continue with heparin for now. Potential DOAC to start tomorrow. Palliative care meeting 12/6.  Goal of Therapy:  Heparin level 0.3-0.7 units/ml Monitor platelets by anticoagulation protocol: Yes   Plan:  Heparin bolus 2000 units IV x 1, then increase gtt to 1550 units/hr Check 8hr heparin level 0300 Daily heparin level and CBC. Continue to monitor hematuria.  Erskine Speed, PharmD Clincial Pharmacist 06/08/2022 7:00 PM

## 2022-06-09 DIAGNOSIS — Z7189 Other specified counseling: Secondary | ICD-10-CM | POA: Diagnosis not present

## 2022-06-09 DIAGNOSIS — E875 Hyperkalemia: Secondary | ICD-10-CM | POA: Diagnosis not present

## 2022-06-09 DIAGNOSIS — D631 Anemia in chronic kidney disease: Secondary | ICD-10-CM

## 2022-06-09 DIAGNOSIS — I824Y1 Acute embolism and thrombosis of unspecified deep veins of right proximal lower extremity: Secondary | ICD-10-CM | POA: Diagnosis not present

## 2022-06-09 DIAGNOSIS — N186 End stage renal disease: Secondary | ICD-10-CM | POA: Diagnosis not present

## 2022-06-09 DIAGNOSIS — I2699 Other pulmonary embolism without acute cor pulmonale: Secondary | ICD-10-CM | POA: Diagnosis not present

## 2022-06-09 DIAGNOSIS — D72829 Elevated white blood cell count, unspecified: Secondary | ICD-10-CM | POA: Diagnosis not present

## 2022-06-09 DIAGNOSIS — N185 Chronic kidney disease, stage 5: Secondary | ICD-10-CM

## 2022-06-09 LAB — CBC
HCT: 32.7 % — ABNORMAL LOW (ref 36.0–46.0)
Hemoglobin: 10.4 g/dL — ABNORMAL LOW (ref 12.0–15.0)
MCH: 27.7 pg (ref 26.0–34.0)
MCHC: 31.8 g/dL (ref 30.0–36.0)
MCV: 87.2 fL (ref 80.0–100.0)
Platelets: 323 10*3/uL (ref 150–400)
RBC: 3.75 MIL/uL — ABNORMAL LOW (ref 3.87–5.11)
RDW: 17.2 % — ABNORMAL HIGH (ref 11.5–15.5)
WBC: 18.8 10*3/uL — ABNORMAL HIGH (ref 4.0–10.5)
nRBC: 0 % (ref 0.0–0.2)

## 2022-06-09 LAB — BASIC METABOLIC PANEL
Anion gap: 13 (ref 5–15)
BUN: 30 mg/dL — ABNORMAL HIGH (ref 8–23)
CO2: 27 mmol/L (ref 22–32)
Calcium: 8.1 mg/dL — ABNORMAL LOW (ref 8.9–10.3)
Chloride: 91 mmol/L — ABNORMAL LOW (ref 98–111)
Creatinine, Ser: 5.01 mg/dL — ABNORMAL HIGH (ref 0.44–1.00)
GFR, Estimated: 8 mL/min — ABNORMAL LOW (ref >60)
Glucose, Bld: 77 mg/dL (ref 70–99)
Potassium: 4.1 mmol/L (ref 3.5–5.1)
Sodium: 131 mmol/L — ABNORMAL LOW (ref 135–145)

## 2022-06-09 LAB — HEPARIN LEVEL (UNFRACTIONATED)
Heparin Unfractionated: 0.83 IU/mL — ABNORMAL HIGH (ref 0.30–0.70)
Heparin Unfractionated: 0.19 IU/mL — ABNORMAL LOW (ref 0.30–0.70)

## 2022-06-09 LAB — MAGNESIUM: Magnesium: 1.8 mg/dL (ref 1.7–2.4)

## 2022-06-09 LAB — HEPATITIS B SURFACE ANTIBODY, QUANTITATIVE: Hep B S AB Quant (Post): 7 m[IU]/mL — ABNORMAL LOW (ref >9.9)

## 2022-06-09 MED ORDER — MIDODRINE HCL 5 MG PO TABS
5.0000 mg | ORAL_TABLET | Freq: Three times a day (TID) | ORAL | Status: DC
  Administered 2022-06-09 – 2022-06-11 (×4): 5 mg via ORAL
  Filled 2022-06-09 (×4): qty 1

## 2022-06-09 MED ORDER — LACTATED RINGERS IV BOLUS: 250.0000 mL | Freq: Once | INTRAVENOUS | Status: DC

## 2022-06-09 NOTE — Progress Notes (Signed)
ANTICOAGULATION CONSULT NOTE  Pharmacy Consult for Heparin Indication: pulmonary embolus  Allergies  Allergen Reactions   Blood-Group Specific Substance    Tylenol [Acetaminophen] Rash    Patient Measurements: Height: '5\' 4"'$  (162.6 cm) Weight: 55.7 kg (122 lb 12.7 oz) IBW/kg (Calculated) : 54.7 Heparin Dosing Weight: 59kg   Vital Signs: Temp: 98.4 F (36.9 C) (12/06 0453) Temp Source: Oral (12/06 0453) BP: 100/53 (12/06 0453) Pulse Rate: 69 (12/06 0453)  Labs: Recent Labs    06/07/22 0012 06/07/22 0618 06/07/22 2117 06/08/22 0535 06/08/22 0656 06/08/22 1715 06/09/22 0514  HGB 11.8*  --   --  10.7*  --   --  10.4*  HCT 38.2  --   --  33.8*  --   --  32.7*  PLT 426*  --   --  372  --   --  323  HEPARINUNFRC  --   --    < > <0.10*  --  0.12* 0.83*  CREATININE 5.62*  --   --   --  7.74*  --   --   TROPONINIHS 9 13  --   --   --   --   --    < > = values in this interval not displayed.     Estimated Creatinine Clearance: 5.3 mL/min (A) (by C-G formula based on SCr of 7.74 mg/dL (H)).   Medical History: Past Medical History:  Diagnosis Date   Arthritis    "legs, knees; not bad" (01/30/2018)   Diabetic foot infection ([place]) 10/02/2021   Dizziness    occasionally   ESRD (end stage renal disease) ([place])    [name]; TTS" (01/30/2018)   History of colon polyps    History of gout    takes Allopurinol daily (01/30/2018)   Hyperlipidemia    takes Pravastatin daily   Hypertension    takes Monopril daily   Joint pain    PAD (peripheral artery disease) ([place])    Peripheral neuropathy    Peripheral vascular disease ([place])    Pneumonia    Pre-diabetes    Refusal of blood transfusions as patient is Jehovah's Witness    NO BLOOD OR BLOOD PRODUCTS. Albumin okay.    Stroke [facility]) 1990s   "mini stroke; left lower mouth a little bit twisted since" (01/30/2018)     Assessment: 76 yo female presented on 06/06/2022 for chest pain. Pharmacy consulted for heparin for concern  for VTE. PMH of metastatic bladder cancer and ESRD on iHD TTS. CT obtained and found to have acute PE with RHS. Patient with hematuria at baseline and currently ongoing.    Heparin level supratherapeutic s/p rate increase to 1550 units/hr  Goal of Therapy:  Heparin level 0.3-0.7 units/ml Monitor platelets by anticoagulation protocol: Yes   Plan:  Decrease heparin gtt to 1450 units/hr F/u 8 hour heparin level F/u transition to [place], PharmD, [facility] Clinical Pharmacist ED Pharmacist Phone # [phone] 06/09/2022 5:50 AM

## 2022-06-09 NOTE — Progress Notes (Signed)
[facility] PROGRESS NOTE  RANEY ANTWINE (DOB: [birth year]) BZJ:[id number] PCP: Vernie Shanks, MD (Inactive) Outpatient Specialists: Oncology, Dr. Alen Blew  Brief Narrative: Joanne Carlson is a 76 y.o. female with a history of HTN, HLD, ESRD (HD on TTS), PVD, T2DM, and metastatic bladder CA progressing despite therapy s/p TURBT who presented to the ED on 06/06/2022 with left side pleuritic pain. CT scan of the chest noted acute PE in the right lower lobe along with RV/LV ratio greater than 1 suggestive of right heart strain with multiple bilateral pulmonary nodules.  Patient was started on heparin drip.  Nephrology was consulted for dialysis. Palliative care was consulted for goals of care discussions and MOST form was completed. Patient is DNR.   Subjective: Left sided pain is improved overall, no new bleeding. No shortness of breath. Getting OOB near baseline. Felt hot yesterday at time of fever but denies feeling unwell, or having any cough, URI symptoms, sick contacts, urinary symptoms that are new, abd pain or new wounds.  Objective: BP (!) 88/46   Pulse 72   Temp (!) 97.4 F (36.3 C) (Axillary)   Resp 18   Ht '5\' 4"'$  (1.626 m)   Wt 55.7 kg   SpO2 90%   BMI 21.08 kg/m   Gen: Pleasant 76yo F in no distress Pulm: Clear, nonlabored  CV: RRR GI: Soft, NT, ND, +BS  Neuro: Alert and oriented. No new focal deficits. Ext: Warm, no deformities Skin: No acute rashes, lesions or ulcers on visualized skin   Assessment & Plan: Acute PE and acute RLE DVT: Due to hypercoagulability from metastatic cancer. CT showing evidence of right heart strain but echocardiogram does not show any evidence of strain pattern. - Transition heparin > eliquis 12/6.   Leukocytosis: Persistent and developed fever to 101F yesterday evening. No focal nidus of infection. Certainly malignancy and/or VTE can contribute to these findings, though patient is at high risk of poor clinical outcome if occult  infection is present.  - Monitor fever curve x24 hours, culture if fevers. Monitor off abx.      Hypotension: Reports she gets midodrine with HD but BP is soft currently. Asymptomatic. No tachycardia. Volume status is reasonable, no exam evidence of obstructive shock. Some hematuria at baseline which is not worse, hgb 10.4g/dl. - Add midodrine to medications. Monitor for infection.    Hyperkalemia: Resolved.  - Lokelma.   - Nephrology consulted for dialysis   ESRD on HD: Patient on TTS schedule. 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  - Pt managed by urology, Dr. Tresa Moore, and oncology, Dr. Alen Blew who have recommended hospice care.  - Palliative care consulted.   Patrecia Pour, MD [facility] [email or web address] 06/09/2022, 5:20 PM

## 2022-06-09 NOTE — Progress Notes (Signed)
Daily Progress Note   Patient Name: Joanne Carlson       Date: 06/09/2022 DOB: 19-Jun-1946  Age: 76 y.o. MRN#: [id number] Attending Physician: Patrecia Pour, MD Primary Care Physician: Vernie Shanks, MD (Inactive) Admit Date: 06/06/2022  Reason for Consultation/Follow-up: Establishing goals of care  Subjective: Medical records reviewed including progress notes, labs, imaging. Patient assessed at the bedside.  She reports feeling well today.Her daughter [name] is present for scheduled family meeting.  We discussed patient's acute illness and bladder cancer with focus on her quality of life and discussed expectations that this will begin to worsen in the future as her disease progresses.  Counseled on the importance of anticipatory care planning, discussing her preferences for limitations and boundaries when this occurs.  The option of hospice care was reviewed in detail.  She states that as long as she can clean up after herself, read her books, and enjoy her other current activities than this is an acceptable quality of life for her.  She does not want to suffer unnecessarily and would be more ready for comfort focused care if she began to feel worse.    A MOST form was reviewed and extensive conversation was had, covering concepts specific to code status, artifical feeding and hydration, continued IV antibiotics and rehospitalization.  Assisted with completion today.  She has preference for limited medical interventions but avoid the ICU and no feeding tube.  Reviewed her wishes to name her daughter [name] as primary HCPOA and she is agreeable to completing this today with assistance from a notary.  Questions and concerns addressed.  PMT will continue to support holistically.   Length of Stay:  1  Physical Exam Vitals and nursing note reviewed.  Constitutional:      General: She is not in acute distress.    Appearance: She is ill-appearing.  Cardiovascular:     Rate and Rhythm: Normal rate.  Pulmonary:     Effort: Pulmonary effort is normal.  Skin:    General: Skin is warm and dry.  Neurological:     Mental Status: She is alert and oriented to person, place, and time.  Psychiatric:        Mood and Affect: Mood normal.        Behavior: Behavior normal.  Vital Signs: BP (!) 94/51 (BP Location: Left Arm)   Pulse 77   Temp 97.9 F (36.6 C) (Oral)   Resp 16   Ht '5\' 4"'$  (1.626 m)   Wt 55.7 kg   SpO2 94%   BMI 21.08 kg/m  SpO2: SpO2: 94 % O2 Device: O2 Device: Room Air O2 Flow Rate:        Palliative Assessment/Data: 50%   Palliative Care Assessment & Plan   Patient Profile: 76 y.o. female  with past medical history of hypertension, hyperlipidemia, ESRD on HD, PVD, diabetes mellitus type 2, and metastatic bladder cancer s/p TURBT admitted on 06/06/2022 with left-sided chest pain.    In the ED, CT scan of the chest noted acute PE in the right lower lobe along with RV/LV ratio greater than 1, suggestive of right heart strain, with multiple bilateral pulmonary nodules which appear either new and/or enlarged from prior CT, and interval development of innumerable hypoenhancing liver lesions concerning for worsening metastatic disease.  Patient's treatment options have been very limited as oncology has previously recommended hospice.  Patient was seen once by outpatient palliative care in September.   PMT has been consulted to assist with goals of care conversation.  Assessment: Goals of care conversation Metastatic bladder cancer Acute PE Acute DVT of right lower extremity End-stage renal disease on dialysis  Recommendations/Plan: Continue DNR Continue current care and HD No further escalation of care or ICU if she worsens and no feeding tube MOST form  completed today and left in patient's hard chart.  Provided patient and her daughter with copies.  Will scan copy into EMR as well. Patient's goal is to continue enjoying her current quality of life for as long as she can.  She would be in favor of transition to hospice if she were to decline further Outpatient palliative care for ongoing goals of care discussions at discharge, [facility] assistance is appreciated Complete advanced directives as able during admission Psychosocial and emotional support provided PMT will continue to follow and support  Prognosis: Poor long-term prognosis with metastatic cancer and no treatment options per oncology   Discharge Planning: To Be Determined   Care plan was discussed with patient, patient's daughter  MDM high         Aime Meloche [name], PA-C  Palliative Medicine Team Team phone # [phone]  Thank you for allowing the Palliative Medicine Team to assist in the care of this patient. Please utilize secure chat with additional questions, if there is no response within 30 minutes please call the above phone number.  Palliative Medicine Team providers are available by phone from 7am to 7pm daily and can be reached through the team cell phone.  Should this patient require assistance outside of these hours, please call the patient's attending physician.

## 2022-06-09 NOTE — Progress Notes (Signed)
ANTICOAGULATION CONSULT NOTE  Pharmacy Consult for Heparin Indication: pulmonary embolus  Allergies  Allergen Reactions   Blood-Group Specific Substance    Tylenol [Acetaminophen] Rash    Patient Measurements: Height: '5\' 4"'$  (162.6 cm) Weight: 55.7 kg (122 lb 12.7 oz) IBW/kg (Calculated) : 54.7 Heparin Dosing Weight: 59kg   Vital Signs: Temp: 97.4 F (36.3 C) (12/06 1120) Temp Source: Oral (12/06 1120) BP: 95/50 (12/06 1120) Pulse Rate: 73 (12/06 1120)  Labs: Recent Labs    06/07/22 0012 06/07/22 0618 06/07/22 2117 06/08/22 0535 06/08/22 0656 06/08/22 1715 06/09/22 0514 06/09/22 1431  HGB 11.8*  --   --  10.7*  --   --  10.4*  --   HCT 38.2  --   --  33.8*  --   --  32.7*  --   PLT 426*  --   --  372  --   --  323  --   HEPARINUNFRC  --   --    < > <0.10*  --  0.12* 0.83* 0.19*  CREATININE 5.62*  --   --   --  7.74*  --  5.01*  --   TROPONINIHS 9 13  --   --   --   --   --   --    < > = values in this interval not displayed.     Estimated Creatinine Clearance: 8.1 mL/min (A) (by C-G formula based on SCr of 5.01 mg/dL (H)).   Medical History: Past Medical History:  Diagnosis Date   Arthritis    "legs, knees; not bad" (01/30/2018)   Diabetic foot infection ([place]) 10/02/2021   Dizziness    occasionally   ESRD (end stage renal disease) ([place])    [name]; TTS" (01/30/2018)   History of colon polyps    History of gout    takes Allopurinol daily (01/30/2018)   Hyperlipidemia    takes Pravastatin daily   Hypertension    takes Monopril daily   Joint pain    PAD (peripheral artery disease) ([place])    Peripheral neuropathy    Peripheral vascular disease ([place])    Pneumonia    Pre-diabetes    Refusal of blood transfusions as patient is Jehovah's Witness    NO BLOOD OR BLOOD PRODUCTS. Albumin okay.    Stroke [facility]) 1990s   "mini stroke; left lower mouth a little bit twisted since" (01/30/2018)     Assessment: 76 yo female presented on 06/06/2022 for chest  pain. Pharmacy consulted for heparin for concern for VTE. PMH of metastatic bladder cancer and ESRD on iHD TTS. CT obtained and found to have acute PE with RHS. Patient with hematuria at baseline and currently ongoing.    Heparin level came back subtherapeutic at 0.19, on heparin 1450 units/hr. CBC stable. No infusion issues.   Goal of Therapy:  Heparin level 0.3-0.7 units/ml Monitor platelets by anticoagulation protocol: Yes   Plan:  Increase heparin gtt to 1500 units/hr F/u 8 hour heparin level F/u transition to [place], PharmD, [place] Clinical Pharmacist  Phone: [phone] 06/09/2022 3:31 PM  Please check AMION for all [place] phone numbers After 10:00 PM, call [place] [phone]

## 2022-06-09 NOTE — Plan of Care (Signed)
  Problem: Education: Goal: Knowledge of General Education information will improve Description: Including pain rating scale, medication(s)/side effects and non-pharmacologic comfort measures 06/09/2022 1643 by Arna Medici, RN Outcome: Progressing 06/09/2022 1150 by Arna Medici, RN Outcome: Progressing   Problem: Health Behavior/Discharge Planning: Goal: Ability to manage health-related needs will improve 06/09/2022 1643 by Arna Medici, RN Outcome: Progressing 06/09/2022 1150 by Arna Medici, RN Outcome: Progressing   Problem: Clinical Measurements: Goal: Ability to maintain clinical measurements within normal limits will improve 06/09/2022 1643 by Arna Medici, RN Outcome: Progressing 06/09/2022 1150 by Arna Medici, RN Outcome: Progressing Goal: Will remain free from infection 06/09/2022 1643 by Arna Medici, RN Outcome: Progressing 06/09/2022 1150 by Arna Medici, RN Outcome: Progressing Goal: Respiratory complications will improve 06/09/2022 1643 by Arna Medici, RN Outcome: Progressing 06/09/2022 1150 by Arna Medici, RN Outcome: Progressing Goal: Cardiovascular complication will be avoided 06/09/2022 1643 by Arna Medici, RN Outcome: Progressing 06/09/2022 1150 by Arna Medici, RN Outcome: Progressing   Problem: Activity: Goal: Risk for activity intolerance will decrease 06/09/2022 1643 by Arna Medici, RN Outcome: Progressing 06/09/2022 1150 by Arna Medici, RN Outcome: Progressing   Problem: Nutrition: Goal: Adequate nutrition will be maintained 06/09/2022 1643 by Arna Medici, RN Outcome: Progressing 06/09/2022 1150 by Arna Medici, RN Outcome: Progressing   Problem: Coping: Goal: Level of anxiety will decrease Outcome: Progressing   Problem: Elimination: Goal: Will not experience complications related to urinary retention Outcome: Progressing

## 2022-06-09 NOTE — Progress Notes (Signed)
[facility] Progress Note   Subjective:  Seen in room - breathing was fine overnight, BP lower than usual today, but not dizzy or otherwise symptomatic.    Objective Vitals:   06/09/22 0038 06/09/22 0453 06/09/22 0735 06/09/22 1120  BP: (!) 82/41 (!) 100/53 (!) 94/51 (!) 95/50  Pulse: 69 69 77 73  Resp: '15 15 16 20  '$ Temp: 97.8 F (36.6 C) 98.4 F (36.9 C) 97.9 F (36.6 C) (!) 97.4 F (36.3 C)  TempSrc: Oral Oral Oral Oral  SpO2: 99%  94% 90%  Weight:      Height:       Physical Exam General: Well appearing woman, NAD. Nasal O2 Heart:RRR; 2/6 murmur Lungs: CTAB; no wheezing Abdomen: soft Extremities: No BLE edema Dialysis Access: RUE AVF + bruit  Additional Objective Labs: Basic Metabolic Panel: Recent Labs  Lab 06/07/22 0012 06/08/22 0656 06/09/22 0514  NA 135 132* 131*  K 5.6* 5.3* 4.1  CL 91* 94* 91*  CO2 '29 24 27  '$ GLUCOSE 138* 66* 77  BUN 43* 57* 30*  CREATININE 5.62* 7.74* 5.01*  CALCIUM 8.5* 8.2* 8.1*  PHOS  --  4.1  --    Liver Function Tests: Recent Labs  Lab 06/08/22 0656  ALBUMIN 1.7*   CBC: Recent Labs  Lab 06/07/22 0012 06/08/22 0535 06/09/22 0514  WBC 18.5* 20.6* 18.8*  HGB 11.8* 10.7* 10.4*  HCT 38.2 33.8* 32.7*  MCV 90.5 89.7 87.2  PLT 426* 372 323   Studies/Results: ECHOCARDIOGRAM COMPLETE  Result Date: 06/07/2022    ECHOCARDIOGRAM REPORT   Patient Name:   Joanne Carlson Date of Exam: 06/07/2022 Medical Rec #:  [id number]        Height:       64.0 in Accession #:    [id number]       Weight:       130.0 lb Date of Birth:  11/20/1945         BSA:          1.629 m Patient Age:    76 years         BP:           133/60 mmHg Patient Gender: F                HR:           95 bpm. Exam Location:  Inpatient Procedure: 2D Echo, Cardiac Doppler and Color Doppler Indications:    Pulmonary Embolus I26.09  History:        Patient has prior history of Echocardiogram examinations, most                 recent 11/17/2020. PAD and Stroke;  Risk Factors:Dyslipidemia and                 Hypertension. End stage renal disease.  Sonographer:    Darlina Sicilian RDCS Referring Phys: Delbert Phenix SMITH IMPRESSIONS  1. Left ventricular ejection fraction, by estimation, is 65 to 70%. The left ventricle has normal function. The left ventricle has no regional wall motion abnormalities. Left ventricular diastolic parameters are indeterminate.  2. Right ventricular systolic function is normal. The right ventricular size is normal.  3. The mitral valve is normal in structure. Trivial mitral valve regurgitation.  4. AV is thickened, calcified with restricted motion Peak and mean gradients through the valve are 29 and 14 mm Hg respectively AVA (VTI) is 1.21 cm2 Dimensionless index is 0.47 consistent wiht mild  to moderate AS. Compared to echo from 2022, mean gradient across valve is mildly increased (11 to 14 mm HG). The aortic valve is tricuspid. Aortic valve regurgitation is not visualized.  5. The inferior vena cava is normal in size with greater than 50% respiratory variability, suggesting right atrial pressure of 3 mmHg. FINDINGS  Left Ventricle: Left ventricular ejection fraction, by estimation, is 65 to 70%. The left ventricle has normal function. The left ventricle has no regional wall motion abnormalities. The left ventricular internal cavity size was normal in size. There is  no left ventricular hypertrophy. Left ventricular diastolic parameters are indeterminate. Right Ventricle: The right ventricular size is normal. Right vetricular wall thickness was not assessed. Right ventricular systolic function is normal. Left Atrium: Left atrial size was normal in size. Right Atrium: Right atrial size was normal in size. Pericardium: There is no evidence of pericardial effusion. Mitral Valve: The mitral valve is normal in structure. Trivial mitral valve regurgitation. Tricuspid Valve: The tricuspid valve is normal in structure. Tricuspid valve regurgitation is mild.  Aortic Valve: AV is thickened, calcified with restricted motion Peak and mean gradients through the valve are 29 and 14 mm Hg respectively AVA (VTI) is 1.21 cm2 Dimensionless index is 0.47 consistent wiht mild to moderate AS. Compared to echo from 2022, mean gradient across valve is mildly increased (11 to 14 mm HG). The aortic valve is tricuspid. Aortic valve regurgitation is not visualized. Aortic valve mean gradient measures 13.5 mmHg. Aortic valve peak gradient measures 25.2 mmHg. Aortic valve area,  by VTI measures 1.21 cm. Pulmonic Valve: The pulmonic valve was normal in structure. Pulmonic valve regurgitation is not visualized. Aorta: The aortic root and ascending aorta are structurally normal, with no evidence of dilitation. Venous: The inferior vena cava is normal in size with greater than 50% respiratory variability, suggesting right atrial pressure of 3 mmHg. IAS/Shunts: No atrial level shunt detected by color flow Doppler.  LEFT VENTRICLE PLAX 2D LVIDd:         4.10 cm   Diastology LVIDs:         2.50 cm   LV e' medial:    8.06 cm/s LV PW:         0.60 cm   LV E/e' medial:  17.2 LV IVS:        0.70 cm   LV e' lateral:   8.70 cm/s LVOT diam:     1.80 cm   LV E/e' lateral: 16.0 LV SV:         68 LV SV Index:   42 LVOT Area:     2.54 cm  RIGHT VENTRICLE TAPSE (M-mode): 2.1 cm LEFT ATRIUM             Index        RIGHT ATRIUM           Index LA diam:        3.80 cm 2.33 cm/m   RA Area:     15.50 cm LA Vol (A2C):   44.9 ml 27.56 ml/m  RA Volume:   38.10 ml  23.39 ml/m LA Vol (A4C):   45.5 ml 27.93 ml/m LA Biplane Vol: 47.1 ml 28.91 ml/m  AORTIC VALVE AV Area (Vmax):    1.23 cm AV Area (Vmean):   1.22 cm AV Area (VTI):     1.21 cm AV Vmax:           251.00 cm/s AV Vmean:  172.500 cm/s AV VTI:            0.563 m AV Peak Grad:      25.2 mmHg AV Mean Grad:      13.5 mmHg LVOT Vmax:         121.00 cm/s LVOT Vmean:        83.000 cm/s LVOT VTI:          0.267 m LVOT/AV VTI ratio: 0.47  AORTA Ao  Root diam: 2.60 cm Ao Asc diam:  2.60 cm MITRAL VALVE                TRICUSPID VALVE MV Area (PHT): 3.97 cm     TR Peak grad:   33.2 mmHg MV Decel Time: 191 msec     TR Vmax:        288.00 cm/s MV E velocity: 139.00 cm/s MV A velocity: 126.00 cm/s  SHUNTS MV E/A ratio:  1.10         Systemic VTI:  0.27 m                             Systemic Diam: 1.80 cm Dorris Carnes MD Electronically signed by Dorris Carnes MD Signature Date/Time: 06/07/2022/4:42:06 PM    Final    VAS [place] LOWER EXTREMITY VENOUS (DVT)  Result Date: 06/07/2022  Lower Venous DVT Study Patient Name:  Joanne Carlson  Date of Exam:   06/07/2022 Medical Rec #: [id number]         Accession #:    [id number] Date of Birth: [birth year]          Patient Gender: F Patient Age:   37 years Exam Location:  [facility] Procedure:      VAS [place] LOWER EXTREMITY VENOUS (DVT) Referring Phys: Fuller Plan --------------------------------------------------------------------------------  Indications: Pulmonary embolism.  Risk Factors: Confirmed PE None identified. Anticoagulation: Heparin. Limitations: Poor ultrasound/tissue interface. Comparison Study: No prior studies. Performing Technologist: Oliver Hum RVT  Examination Guidelines: A complete evaluation includes B-mode imaging, spectral Doppler, color Doppler, and power Doppler as needed of all accessible portions of each vessel. Bilateral testing is considered an integral part of a complete examination. Limited examinations for reoccurring indications may be performed as noted. The reflux portion of the exam is performed with the patient in reverse Trendelenburg.  +---------+---------------+---------+-----------+----------+--------------+ RIGHT    CompressibilityPhasicitySpontaneityPropertiesThrombus Aging +---------+---------------+---------+-----------+----------+--------------+ CFV      Partial        Yes      No                   Acute           +---------+---------------+---------+-----------+----------+--------------+ SFJ      Full                                                        +---------+---------------+---------+-----------+----------+--------------+ FV Prox  Full                                                        +---------+---------------+---------+-----------+----------+--------------+ FV Mid   Full                                                        +---------+---------------+---------+-----------+----------+--------------+  FV Distal               Yes      Yes                                 +---------+---------------+---------+-----------+----------+--------------+ PFV      Partial        Yes      No                   Acute          +---------+---------------+---------+-----------+----------+--------------+ POP      Full           Yes      No                                  +---------+---------------+---------+-----------+----------+--------------+ PTV      Full                                                        +---------+---------------+---------+-----------+----------+--------------+ PERO     Full                                                        +---------+---------------+---------+-----------+----------+--------------+ EIV                     Yes      No                                  +---------+---------------+---------+-----------+----------+--------------+ CIV                     Yes      No                                  +---------+---------------+---------+-----------+----------+--------------+   +---------+---------------+---------+-----------+----------+--------------+ LEFT     CompressibilityPhasicitySpontaneityPropertiesThrombus Aging +---------+---------------+---------+-----------+----------+--------------+ CFV      Full           Yes      No                                   +---------+---------------+---------+-----------+----------+--------------+ SFJ      Full                                                        +---------+---------------+---------+-----------+----------+--------------+ FV Prox  Full                                                        +---------+---------------+---------+-----------+----------+--------------+  FV Mid                  Yes      No                                  +---------+---------------+---------+-----------+----------+--------------+ FV DistalFull           Yes      No                                  +---------+---------------+---------+-----------+----------+--------------+ PFV      Full                                                        +---------+---------------+---------+-----------+----------+--------------+ POP      Full           Yes      No                                  +---------+---------------+---------+-----------+----------+--------------+ PTV      Full                                                        +---------+---------------+---------+-----------+----------+--------------+ PERO     Full                                                        +---------+---------------+---------+-----------+----------+--------------+     Summary: RIGHT: - Findings consistent with acute deep vein thrombosis involving the right common femoral vein, and right proximal profunda vein. - No cystic structure found in the popliteal fossa.  LEFT: - There is no evidence of deep vein thrombosis in the lower extremity.  - No cystic structure found in the popliteal fossa.  *See table(s) above for measurements and observations. Electronically signed by Servando Snare MD on 06/07/2022 at 4:37:37 PM.    Final     Medications:  heparin 1,450 Units/hr (06/09/22 1610)   lactated ringers      (feeding supplement) PROSource Plus  30 mL Oral BID BM   atorvastatin  80 mg Oral QPM   cinacalcet  30  mg Oral Q supper   lidocaine-prilocaine  1 Application Topical Q T,Th,Sa-HD   multivitamin  1 tablet Oral Daily   sodium chloride flush  3 mL Intravenous Q12H   sodium zirconium cyclosilicate  10 g Oral Once   sucroferric oxyhydroxide  1,000 mg Oral TID WC    Dialysis Orders: TTS at [facility] 3:30hr, 350/600, EDW 56.7kg, 2K/2.5Ca, AVF, heparin 7000unit bolus - Mircera on hold - last given 259mg on 11/9 - Hectoral 173m IV q HD   Assessment/Plan:  Pulmonary embolus: Risk factor is her bladder cancer. LS doppler US[place]ore[place]owed acte DVT to R common femoral and R proximal profunda veins. Echo with normal LV  and RV function. On heparin drip - further AC per primary. ESRD:  Continue HD per usual TTS schedule - next HD tomorrow (12/7). Got well under her dry weight with last HD, planning for lower goal tomorrow. BP/volume: BP low/stable - no edema. Uses midodrine pre-HD, may need to use daily.  Anemia: Hgb 10.4 - no ESA needs at this time.  Metabolic bone disease: Ca/Phos to goal. Continue Velphoro + sensipar.  Nutrition: Alb low, adding supplements.  Metastatic bladder cancer: Per daughter - running out of treatment options.  GOC: Appreciate palliative care assistance - now DNR, but wishes to continue HD for now.   Joanne Penton, PA-C 06/09/2022, 11:56 AM  [employer]

## 2022-06-09 NOTE — Evaluation (Signed)
Physical Therapy Evaluation & Discharge Patient Details Name: Joanne Carlson MRN: [id number] DOB: 12-08-1945 Today's Date: 06/09/2022  History of Present Illness  Pt is a 76 y.o. female admitted 06/06/22 with L-side abdominal pain, weight loss. Workup revealed RLE DVT, acute PE with R heart strain, multiple bilateral pulmonary nodules concerning for worsening metastatic disease. 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   Clinical Impression  Patient evaluated by Physical Therapy with no further acute PT needs identified. PTA, pt mod indep with rollator, lives alone in senior living apartment, though son staying with her recently for increased assist; family assists with ADL/iADLs and transportation as needed. Today, pt mod indep with transfers and ambulation using rollator. Pt's daughter present and supportive; pt and family receptive to education. All education has been completed and the patient has no further questions. Acute PT is signing off. Thank you for this referral.     Recommendations for follow up therapy are one component of a multi-disciplinary discharge planning process, led by the attending physician.  Recommendations may be updated based on patient status, additional functional criteria and insurance authorization.  Follow Up Recommendations No PT follow up      Assistance Recommended at Discharge Intermittent Supervision/Assistance  Patient can return home with the following  Assistance with cooking/housework;Direct supervision/assist for medications management;Direct supervision/assist for financial management;Assist for transportation    Equipment Recommendations None recommended by PT  Recommendations for Other Services   Mobility Specialist   Functional Status Assessment       Precautions / Restrictions Precautions Precautions: Fall Restrictions Weight Bearing  Restrictions: No      Mobility  Bed Mobility Overal bed mobility: Modified Independent             General bed mobility comments: supine<>sit    Transfers Overall transfer level: Modified independent Equipment used: Rollator (4 wheels)               General transfer comment: good awareness to lock/unlock rollator brakes without cues    Ambulation/Gait Ambulation/Gait assistance: Modified independent (Device/Increase time) Gait Distance (Feet): 490 Feet Assistive device: Rollator (4 wheels) Gait Pattern/deviations: Step-through pattern, Decreased stride length, Trunk flexed Gait velocity: Decreased     General Gait Details: slow, steady gait mod indep with rollator, only requiring assist for line management  Stairs            Wheelchair Mobility    Modified Rankin (Stroke Patients Only)       Balance Overall balance assessment: Needs assistance   Sitting balance-Leahy Scale: Good Sitting balance - Comments: indep to adjust bilateral socks sitting EOB   Standing balance support: No upper extremity supported, During functional activity Standing balance-Leahy Scale: Fair Standing balance comment: can stand and take steps without UE support                             Pertinent Vitals/Pain Pain Assessment Pain Assessment: No/denies pain    Home Living Family/patient expects to be discharged to:: Private residence Living Arrangements: Children Available Help at Discharge: Family;Available PRN/intermittently Type of Home: Apartment Home Access: Elevator       Home Layout: One level Home Equipment: Rollator (4 wheels);Grab bars - tub/shower Additional Comments: lives in senior living apt complex. son has been staying with pt, he assists with meals    Prior Function Prior Level of Function :  Independent/Modified Independent             Mobility Comments: mod indep ambulator with or without rollator in apartment, uses rollator  for walking around apartment building. daughter drives, pt holds onto cart walking in grocery store ADLs Comments: reports standing to shower. son assists with meals; daughter checks on frequently, assists with housekeeping. pt reports she's indep with med management     Hand Dominance        Extremity/Trunk Assessment   Upper Extremity Assessment Upper Extremity Assessment: Generalized weakness    Lower Extremity Assessment Lower Extremity Assessment: Generalized weakness    Cervical / Trunk Assessment Cervical / Trunk Assessment: Kyphotic  Communication   Communication: HOH  Cognition Arousal/Alertness: Awake/alert Behavior During Therapy: WFL for tasks assessed/performed Overall Cognitive Status: Within Functional Limits for tasks assessed                                 General Comments: WFL for simple tasks, not formally assessed; suspect baseline cognition        General Comments General comments (skin integrity, edema, etc.): pt's daughter present and supportive. educ re: rollator use, fall risk reduction, activity recommendations (daily walking program), potential need for shower seat (aid fall risk/energy conservation)    Exercises     Assessment/Plan    PT Assessment Patient does not need any further PT services  PT Problem List         PT Treatment Interventions      PT Goals (Current goals can be found in the Care Plan section)  Acute Rehab PT Goals PT Goal Formulation: All assessment and education complete, DC therapy    Frequency       Co-evaluation               AM-PAC PT "6 Clicks" Mobility  Outcome Measure Help needed turning from your back to your side while in a flat bed without using bedrails?: None Help needed moving from lying on your back to sitting on the side of a flat bed without using bedrails?: None Help needed moving to and from a bed to a chair (including a wheelchair)?: None Help needed standing up from a  chair using your arms (e.g., wheelchair or bedside chair)?: None Help needed to walk in hospital room?: None Help needed climbing 3-5 steps with a railing? : A Little 6 Click Score: 23    End of Session Equipment Utilized During Treatment: Gait belt Activity Tolerance: Patient tolerated treatment well Patient left: in bed;with call bell/phone within reach;with family/visitor present Nurse Communication: Mobility status PT Visit Diagnosis: Other abnormalities of gait and mobility (R26.89)    Time: 3662-9476 PT Time Calculation (min) (ACUTE ONLY): 24 min   Charges:   PT Evaluation $PT Eval Moderate Complexity: [place], PT, DPT Acute Rehabilitation Services  Personal: [facility] Office: [phone]  Derry Lory 06/09/2022, 3:35 PM

## 2022-06-09 NOTE — Plan of Care (Signed)
  Problem: Education: Goal: Knowledge of General Education information will improve Description: Including pain rating scale, medication(s)/side effects and non-pharmacologic comfort measures Outcome: Progressing   Problem: Health Behavior/Discharge Planning: Goal: Ability to manage health-related needs will improve Outcome: Progressing   Problem: Clinical Measurements: Goal: Ability to maintain clinical measurements within normal limits will improve Outcome: Progressing Goal: Will remain free from infection Outcome: Progressing Goal: Respiratory complications will improve Outcome: Progressing Goal: Cardiovascular complication will be avoided Outcome: Progressing   Problem: Activity: Goal: Risk for activity intolerance will decrease Outcome: Progressing   Problem: Nutrition: Goal: Adequate nutrition will be maintained Outcome: Progressing   Problem: Coping: Goal: Level of anxiety will decrease Outcome: Progressing   

## 2022-06-09 NOTE — Progress Notes (Signed)
This chaplain responded to PMT PA-Josseline's consult for creating/updating the Pt. Advance Directive:  HCPOA.  The Pt. daughter-[name] is at the bedside and participated in [place] education with the Pt. The Pt. answered clarifying questions during the education. Living Will education was shared with the Pt. The document was left in the room for the Pt. and [name] to read together.  The chaplain will follow up with the Pt. in the space of completing the document and providing a notary.  This chaplain is available for F/U spiritual care as needed.  Chaplain Sallyanne Kuster [phone]

## 2022-06-09 NOTE — Progress Notes (Signed)
Pt receives out-pt HD at [facility] on TTS. Will assist as needed.   Taylan Marez Renal Navigator [phone] 

## 2022-06-09 NOTE — Progress Notes (Signed)
TRH night cross cover note:   I was notified by RN of this patient's blood pressure of 82/41, which appears slightly lower than BP earlier in the shift, noted to be 89/45.  Patient reported he without any new symptoms.  It appears that this is an incisional disease patient on hemodialysis, Tuesday, Thursday, Saturday.  Consequently, we will refrain from aggressive IV fluid intervention.  Rather I have ordered a very small IV fluid bolus of 250 cc to be given over an hour.      Babs Bertin, DO Hospitalist

## 2022-06-10 ENCOUNTER — Encounter (HOSPITAL_COMMUNITY): Payer: Self-pay | Admitting: Nephrology

## 2022-06-10 ENCOUNTER — Other Ambulatory Visit (HOSPITAL_COMMUNITY): Payer: Self-pay

## 2022-06-10 DIAGNOSIS — Z789 Other specified health status: Secondary | ICD-10-CM

## 2022-06-10 DIAGNOSIS — Z515 Encounter for palliative care: Secondary | ICD-10-CM

## 2022-06-10 DIAGNOSIS — Z66 Do not resuscitate: Secondary | ICD-10-CM

## 2022-06-10 LAB — CBC WITH DIFFERENTIAL/PLATELET
Abs Immature Granulocytes: 0.13 10*3/uL — ABNORMAL HIGH (ref 0.00–0.07)
Basophils Absolute: 0.1 10*3/uL (ref 0.0–0.1)
Basophils Relative: 0 %
Eosinophils Absolute: 0.7 10*3/uL — ABNORMAL HIGH (ref 0.0–0.5)
Eosinophils Relative: 4 %
HCT: 31.8 % — ABNORMAL LOW (ref 36.0–46.0)
Hemoglobin: 10.5 g/dL — ABNORMAL LOW (ref 12.0–15.0)
Immature Granulocytes: 1 %
Lymphocytes Relative: 9 %
Lymphs Abs: 1.7 10*3/uL (ref 0.7–4.0)
MCH: 28.5 pg (ref 26.0–34.0)
MCHC: 33 g/dL (ref 30.0–36.0)
MCV: 86.2 fL (ref 80.0–100.0)
Monocytes Absolute: 1.2 10*3/uL — ABNORMAL HIGH (ref 0.1–1.0)
Monocytes Relative: 7 %
Neutro Abs: 14.4 10*3/uL — ABNORMAL HIGH (ref 1.7–7.7)
Neutrophils Relative %: 79 %
Platelets: 314 10*3/uL (ref 150–400)
RBC: 3.69 MIL/uL — ABNORMAL LOW (ref 3.87–5.11)
RDW: 17.2 % — ABNORMAL HIGH (ref 11.5–15.5)
WBC: 18.2 10*3/uL — ABNORMAL HIGH (ref 4.0–10.5)
nRBC: 0 % (ref 0.0–0.2)

## 2022-06-10 LAB — BASIC METABOLIC PANEL
Anion gap: 13 (ref 5–15)
BUN: 44 mg/dL — ABNORMAL HIGH (ref 8–23)
CO2: 27 mmol/L (ref 22–32)
Calcium: 8.2 mg/dL — ABNORMAL LOW (ref 8.9–10.3)
Chloride: 92 mmol/L — ABNORMAL LOW (ref 98–111)
Creatinine, Ser: 6.11 mg/dL — ABNORMAL HIGH (ref 0.44–1.00)
GFR, Estimated: 7 mL/min — ABNORMAL LOW (ref >60)
Glucose, Bld: 104 mg/dL — ABNORMAL HIGH (ref 70–99)
Potassium: 4.6 mmol/L (ref 3.5–5.1)
Sodium: 132 mmol/L — ABNORMAL LOW (ref 135–145)

## 2022-06-10 LAB — GLUCOSE, CAPILLARY
Glucose-Capillary: 13 mg/dL — CL (ref 70–99)
Glucose-Capillary: 88 mg/dL (ref 70–99)

## 2022-06-10 LAB — HEPARIN LEVEL (UNFRACTIONATED): Heparin Unfractionated: 0.28 IU/mL — ABNORMAL LOW (ref 0.30–0.70)

## 2022-06-10 LAB — MAGNESIUM: Magnesium: 1.9 mg/dL (ref 1.7–2.4)

## 2022-06-10 MED ORDER — APIXABAN 5 MG PO TABS: 5.0000 mg | ORAL_TABLET | Freq: Two times a day (BID) | ORAL | Status: DC

## 2022-06-10 MED ORDER — APIXABAN 5 MG PO TABS
ORAL_TABLET | ORAL | 0 refills | Status: DC
  Filled 2022-06-10: qty 68, 30d supply, fill #0

## 2022-06-10 MED ORDER — APIXABAN 5 MG PO TABS
10.0000 mg | ORAL_TABLET | Freq: Two times a day (BID) | ORAL | Status: DC
  Administered 2022-06-10 – 2022-06-11 (×3): 10 mg via ORAL
  Filled 2022-06-10 (×3): qty 2

## 2022-06-10 MED ORDER — DEXTROSE 50 % IV SOLN
INTRAVENOUS | Status: AC
  Filled 2022-06-10: qty 50

## 2022-06-10 MED ORDER — MIDODRINE HCL 5 MG PO TABS
5.0000 mg | ORAL_TABLET | Freq: Three times a day (TID) | ORAL | 0 refills | Status: DC
  Filled 2022-06-10: qty 90, 30d supply, fill #0

## 2022-06-10 NOTE — Progress Notes (Signed)
  [facility] Progress Note   Subjective:  Seen in room - no new issues reported. BP remains lower than usual today, but not dizzy or otherwise symptomatic.    Objective Vitals:   06/10/22 0900 06/10/22 1000 06/10/22 1100 06/10/22 1220  BP: (!) 88/47 (!) 95/52 (!) 87/42 (!) 91/40  Pulse:    70  Resp: '14 17 17 16  '$ Temp:    97.6 F (36.4 C)  TempSrc:    Oral  SpO2:    100%  Weight:      Height:       Physical Exam General: Well appearing woman, NAD. On RA  Heart:RRR; 2/6 murmur Lungs: CTAB; no wheezing Abdomen: soft Extremities: No BLE edema Dialysis Access: RUE AVF + bruit  Additional Objective Labs: Basic Metabolic Panel: Recent Labs  Lab 06/08/22 0656 06/09/22 0514 06/10/22 0052  NA 132* 131* 132*  K 5.3* 4.1 4.6  CL 94* 91* 92*  CO2 '24 27 27  '$ GLUCOSE 66* 77 104*  BUN 57* 30* 44*  CREATININE 7.74* 5.01* 6.11*  CALCIUM 8.2* 8.1* 8.2*  PHOS 4.1  --   --     Liver Function Tests: Recent Labs  Lab 06/08/22 0656  ALBUMIN 1.7*    CBC: Recent Labs  Lab 06/07/22 0012 06/08/22 0535 06/09/22 0514 06/10/22 0052  WBC 18.5* 20.6* 18.8* 18.2*  NEUTROABS  --   --   --  14.4*  HGB 11.8* 10.7* 10.4* 10.5*  HCT 38.2 33.8* 32.7* 31.8*  MCV 90.5 89.7 87.2 86.2  PLT 426* 372 323 314    Studies/Results: No results found.  Medications:  lactated ringers      (feeding supplement) PROSource Plus  30 mL Oral BID BM   apixaban  10 mg Oral BID   Followed by   Derrill Memo ON 06/14/2022] apixaban  5 mg Oral BID   atorvastatin  80 mg Oral QPM   cinacalcet  30 mg Oral Q supper   lidocaine-prilocaine  1 Application Topical Q T,Th,Sa-HD   midodrine  5 mg Oral TID WC   multivitamin  1 tablet Oral Daily   sodium chloride flush  3 mL Intravenous Q12H   sucroferric oxyhydroxide  1,000 mg Oral TID WC    Dialysis Orders: TTS at [facility] 3:30hr, 350/600, EDW 56.7kg, 2K/2.5Ca, AVF, heparin 7000unit bolus - Mircera on hold - last given 247mg on 11/9 -  Hectoral 146m IV q HD   Assessment/Plan:  Pulmonary embolus: Risk factor is her bladder cancer. LS doppler USKoreahowed acte DVT to R common femoral and R proximal profunda veins. Echo with normal LV and RV function. On heparin drip now transitioned to eliquis - AC per primary. ESRD:  Continue HD per usual TTS schedule - next HD today (12/7). Got well under her dry weight with last HD, planning for lower goal. BP/volume: BP low/stable - no edema. Uses midodrine pre-HD  Anemia: Hgb 10.4 - no ESA needs at this time.  Metabolic bone disease: Ca/Phos to goal. Continue Velphoro + sensipar.  Nutrition: Alb low, adding supplements.  Metastatic bladder cancer: Per daughter - running out of treatment options.  GOC: Appreciate palliative care assistance - now DNR, but wishes to continue HD for now.  LiJannifer HickD [facility] Pager [phone]

## 2022-06-10 NOTE — TOC Benefit Eligibility Note (Signed)
Patient Advocate Encounter  Insurance verification completed.    The patient is currently admitted and upon discharge could be taking Eliquis 5 mg.  The current 30 day co-pay is $0.00.   The patient is insured through [employer] Medicare Part D   Takita Riecke, CPHT Pharmacy Patient Advocate Specialist [facility] Patient Advocate Team Direct Number: [phone]  Fax: [phone]       

## 2022-06-10 NOTE — Progress Notes (Signed)
Brief Palliative Medicine Progress Note:  PMT following peripherally for needs/decline.  Medical records reviewed including progress notes, labs, imaging. Patient remains medically stable with plan for discharge today. Outpatient Palliative Care has been set up with [facility].   If there are any imminent needs please call the service directly. Family also has PMT contact information should further needs arise.  Thank you for allowing PMT to assist in the care of this patient.  Jaydin Jalomo M. [facility] Palliative Medicine Team Team Phone: [phone] NO CHARGE

## 2022-06-10 NOTE — Progress Notes (Signed)
  X-cover Note: Informed by RN that pt is confused and unsafe for discharge. Pt's dtr states pt does not have 24 hour assistance at home.   Kristopher Oppenheim, DO [facility]

## 2022-06-10 NOTE — Progress Notes (Signed)
This chaplain followed up on notarizing Pt. Advance Directive after AD education on Wednesday.   The Pt. is awake at the time of the visit and informs the chaplain of her discharge today. The chaplain understands the Pt. daughter took the documents home. The Pt. prefers to notarize her AD outside the hospital.  The chaplain listened reflectively to the Pt. storytelling and connections to family. The Pt. continues to choose her daughter-[name] as Research scientist (physical sciences).  Chaplain Sallyanne Kuster [phone]

## 2022-06-10 NOTE — Progress Notes (Signed)
ANTICOAGULATION CONSULT NOTE  Pharmacy Consult for Heparin>apixaban Indication: pulmonary embolus  Allergies  Allergen Reactions   Blood-Group Specific Substance    Tylenol [Acetaminophen] Rash    Patient Measurements: Height: '5\' 4"'$  (162.6 cm) Weight: 55.7 kg (122 lb 12.7 oz) IBW/kg (Calculated) : 54.7 Heparin Dosing Weight: 59kg   Vital Signs: Temp: 98.7 F (37.1 C) (12/07 0514) Temp Source: Oral (12/07 0514) BP: 95/49 (12/07 0514) Pulse Rate: 77 (12/07 0514)  Labs: Recent Labs    06/08/22 0535 06/08/22 0656 06/08/22 1715 06/09/22 0514 06/09/22 1431 06/10/22 0051 06/10/22 0052  HGB 10.7*  --   --  10.4*  --   --  10.5*  HCT 33.8*  --   --  32.7*  --   --  31.8*  PLT 372  --   --  323  --   --  314  HEPARINUNFRC <0.10*  --    < > 0.83* 0.19* 0.28*  --   CREATININE  --  7.74*  --  5.01*  --   --  6.11*   < > = values in this interval not displayed.     Estimated Creatinine Clearance: 6.7 mL/min (A) (by C-G formula based on SCr of 6.11 mg/dL (H)).   Medical History: Past Medical History:  Diagnosis Date   Arthritis    "legs, knees; not bad" (01/30/2018)   Diabetic foot infection ([place]) 10/02/2021   Dizziness    occasionally   ESRD (end stage renal disease) ([place])    Jo[facility]; TTS" (01/30/2018)   History of colon polyps    History of gout    takes Allopurinol daily (01/30/2018)   Hyperlipidemia    takes Pravastatin daily   Hypertension    takes Monopril daily   Joint pain    PAD (peripheral artery disease) ([place])    Peripheral neuropathy    Peripheral vascular disease ([place])    Pneumonia    Pre-diabetes    Refusal of blood transfusions as patient is Jehovah's Witness    NO BLOOD OR BLOOD PRODUCTS. Albumin okay.    Stroke [facility]) 1990s   "mini stroke; left lower mouth a little bit twisted since" (01/30/2018)     Assessment: 76 yo female presented on 06/06/2022 for chest pain. Pharmacy consulted for heparin for concern for VTE. PMH of metastatic bladder  cancer and ESRD on iHD TTS. CT obtained and found to have acute PE with RHS. Patient with hematuria at baseline and currently ongoing.    Heparin start on 12/4 and plans to change to apixaban today  Goal of Therapy:  Heparin level 0.3-0.7 units/ml Monitor platelets by anticoagulation protocol: Yes   Plan:  -Apixaban '10mg'$  po bid for 4 days then '5mg'$  po bid -Since systemic anticoagulation started on 12/4 a shortened apixaban load regimen is being used (a total of 7 days with inclusion of IV heparin). This was considered to help decrease the bleeding risk and was discussed with Dr. Ponciano Ort, PharmD Clinical Pharmacist **Pharmacist phone directory can now be found on [email or web address] ([email or web address]).  Listed under [place].

## 2022-06-10 NOTE — Discharge Summary (Signed)
Physician Discharge Summary   Patient: Joanne Carlson MRN: [id number] DOB: 02-01-1946  Admit date:     06/06/2022  Discharge date: 06/10/22  Discharge Physician: Patrecia Pour   PCP: Vernie Shanks, MD (Inactive)   Recommendations at discharge:  Follow up with outpatient palliative care with [facility]  Discharge Diagnoses: Principal Problem:   Pulmonary embolus ([place]) Active Problems:   DVT (deep venous thrombosis) (HCC)   Leukocytosis   Transient hypotension   Hyperkalemia   End stage renal disease (HCC)   ANEMIA   PVD (peripheral vascular disease) (HCC)   Bladder cancer (HCC)   Pulmonary embolism [facility])  Hospital Course: Joanne Carlson is a 76 y.o. female with a history of HTN, HLD, ESRD (HD on TTS), PVD, T2DM, and metastatic bladder CA progressing despite therapy s/p TURBT who presented to the ED on 06/06/2022 with left side pleuritic pain. CT scan of the chest noted acute PE in the right lower lobe along with RV/LV ratio greater than 1 suggestive of right heart strain with multiple bilateral pulmonary nodules.  Patient was started on heparin drip.  Nephrology was consulted for dialysis. Palliative care was consulted for goals of care discussions and MOST form was completed. Patient is DNR. Her blood pressure is on the lower side, so midodrine is now prescribed even on nondialysis days. She was monitored for evidence of blood loss anemia or sepsis without local findings. No respiratory distress or hypoxia or tachycardia. She is tolerating anticoagulation and is stable for discharge. Please see full details below.  Assessment and Plan: Acute PE and acute RLE DVT: Due to hypercoagulability from metastatic cancer. CT showing evidence of right heart strain but echocardiogram does not show any evidence of strain pattern. No hypoxemia. - Transition heparin > eliquis with total of 7 days load, then to maintenance dose..    Leukocytosis: Persistent and developed fever to  101F yesterday evening. No focal nidus of infection. Certainly malignancy and/or VTE can contribute to these findings. She's had no recurrent fever despite not receiving antipyretics or antimicrobial Tx.  - Return precautions reviewed, will monitor off abx.      Hypotension: Reports she gets midodrine with HD but BP is soft currently. Asymptomatic. No tachycardia. Volume status is reasonable, no exam evidence of obstructive shock. Some hematuria at baseline which is not worse, hgb 10.5g/dl and stable. - Add midodrine to medications. Monitor for infection.    Hyperkalemia: Resolved.  - Nephrology consulted for dialysis   ESRD on HD: Patient remains on TTS schedule. - Nephrology consulted for dialysis while inpatient, continue regular medications.   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  - Pt managed by urology, Dr. Tresa Moore, and oncology, Dr. Alen Blew who have recommended hospice care.  - Palliative care consulted and should continue following. Patient's quality of life is currently pretty good, so wants to continue dialysis which is reasonable.  Consultants: Nephrology, palliative care Procedures performed: HD  Disposition: Home Diet recommendation:  Renal diet DISCHARGE MEDICATION: Allergies as of 06/10/2022       Reactions  Blood-group Specific Substance    Tylenol [acetaminophen] Rash        Medication List     STOP taking these medications    clopidogrel 75 MG tablet Commonly known as:  PLAVIX       TAKE these medications    Accu-Chek Aviva Plus w/Device Kit   allopurinol 100 MG tablet Commonly known as: ZYLOPRIM Take 100 mg by mouth daily.   apixaban 5 MG Tabs tablet Commonly known as: ELIQUIS Take 2 tablets (10 mg total) by mouth 2 (two) times daily for 4 days, THEN 1 tablet (5 mg total) 2 (two) times daily for 26 days. Start taking on: June 10, 2022   aspirin EC 81 MG tablet Take 81 mg by mouth 4 (four) times a week.   atorvastatin 80 MG tablet Commonly known as: LIPITOR TAKE 1 TABLET BY MOUTH ONCE DAILY AT  6PM What changed:  how much to take how to take this when to take this additional instructions   cinacalcet 60 MG tablet Commonly known as: SENSIPAR Take 60 mg by mouth every Monday, Wednesday, and Friday.   gabapentin 100 MG capsule Commonly known as: NEURONTIN Take 1 capsule by mouth every morning and 1 capsule at bedtime. PATIENT NEEDS OFFICE VISIT FOR ADDITIONAL REFILLS What changed:  how much to take how to take this when to take this reasons to take this additional instructions   glucose blood test strip Use to test blood sugar daily. Dx code: 67.02.   Lancets Misc Use to test blood sugar daily. Dx code 250.02.   lidocaine-prilocaine cream Commonly known as: EMLA Apply 1 application topically Every Tuesday,Thursday,and Saturday with dialysis.   midodrine 5 MG tablet Commonly known as: PROAMATINE Take 1 tablet (5 mg total) by mouth 3 (three) times daily with meals.   multivitamin Tabs tablet Take 1 tablet by mouth daily.   oxybutynin 5 MG tablet Commonly known as: DITROPAN Take 1 tablet (5 mg total) by mouth 3 (three) times daily as needed for bladder spasms.   oxyCODONE 5 MG immediate release tablet Commonly known as: Roxicodone Take 1 tablet (5 mg total) by mouth every 8 (eight) hours as needed. What changed: reasons to take this   Velphoro 500 MG chewable tablet Generic drug: sucroferric oxyhydroxide Chew 500  mg by mouth 3 (three) times daily with meals.        Follow-up Information     [facility] Follow up.   Why: Outpatient Palliative-office to call with visit times. Contact information: [place] [address] [phone]        Vernie Shanks, MD Follow up.   Specialty: Family Medicine Contact information: [place] [address] [phone]                Discharge Exam: Danley Danker Weights   06/07/22 1211 06/08/22 2200  Weight: 59 kg 55.7 kg  BP (!) 91/40 (BP Location: Right Arm)   Pulse 70   Temp 97.6 F (36.4 C) (Oral)   Resp 16   Ht _0  (1.626 m)   Wt 55.7 kg   SpO2 100%   BMI 21.08 kg/m   Pleasant elderly female in no distress Clear, nonlabored RRR, no pitting edema No open wounds Soft, no abdominal tenderness  Condition at discharge: stable  The results of significant diagnostics from this hospitalization (including imaging, microbiology, ancillary and laboratory) are listed below for reference.   Imaging Studies: ECHOCARDIOGRAM COMPLETE  Result  Date: 06/07/2022    ECHOCARDIOGRAM REPORT   Patient Name:   DAZIAH HESLER Date of Exam: 06/07/2022 Medical Rec #:  [id number]        Height:       64.0 in Accession #:    [id number]       Weight:       130.0 lb Date of Birth:  04-27-1946         BSA:          1.629 m Patient Age:    76 years         BP:           133/60 mmHg Patient Gender: F                HR:           95 bpm. Exam Location:  Inpatient Procedure: 2D Echo, Cardiac Doppler and Color Doppler Indications:    Pulmonary Embolus I26.09  History:        Patient has prior history of Echocardiogram examinations, most                 recent 11/17/2020. PAD and Stroke; Risk Factors:Dyslipidemia and                 Hypertension. End stage renal disease.  Sonographer:    Darlina Sicilian RDCS Referring Phys: Delbert Phenix SMITH IMPRESSIONS  1. Left ventricular ejection fraction, by estimation, is 65 to  70%. The left ventricle has normal function. The left ventricle has no regional wall motion abnormalities. Left ventricular diastolic parameters are indeterminate.  2. Right ventricular systolic function is normal. The right ventricular size is normal.  3. The mitral valve is normal in structure. Trivial mitral valve regurgitation.  4. AV is thickened, calcified with restricted motion Peak and mean gradients through the valve are 29 and 14 mm Hg respectively AVA (VTI) is 1.21 cm2 Dimensionless index is 0.47 consistent wiht mild to moderate AS. Compared to echo from 2022, mean gradient across valve is mildly increased (11 to 14 mm HG). The aortic valve is tricuspid. Aortic valve regurgitation is not visualized.  5. The inferior vena cava is normal in size with greater than 50% respiratory variability, suggesting right atrial pressure of 3 mmHg. FINDINGS  Left Ventricle: Left ventricular ejection fraction, by estimation, is 65 to 70%. The left ventricle has normal function. The left ventricle has no regional wall motion abnormalities. The left ventricular internal cavity size was normal in size. There is  no left ventricular hypertrophy. Left ventricular diastolic parameters are indeterminate. Right Ventricle: The right ventricular size is normal. Right vetricular wall thickness was not assessed. Right ventricular systolic function is normal. Left Atrium: Left atrial size was normal in size. Right Atrium: Right atrial size was normal in size. Pericardium: There is no evidence of pericardial effusion. Mitral Valve: The mitral valve is normal in structure. Trivial mitral valve regurgitation. Tricuspid Valve: The tricuspid valve is normal in structure. Tricuspid valve regurgitation is mild. Aortic Valve: AV is thickened, calcified with restricted motion Peak and mean gradients through the valve are 29 and 14 mm Hg respectively AVA (VTI) is 1.21 cm2 Dimensionless index is 0.47 consistent wiht mild to moderate AS. Compared  to echo from 2022, mean gradient across valve is mildly increased (11 to 14 mm HG). The aortic valve is tricuspid. Aortic valve regurgitation is not visualized. Aortic valve mean gradient measures 13.5 mmHg. Aortic valve peak gradient measures 25.2 mmHg. Aortic valve  area,  by VTI measures 1.21 cm. Pulmonic Valve: The pulmonic valve was normal in structure. Pulmonic valve regurgitation is not visualized. Aorta: The aortic root and ascending aorta are structurally normal, with no evidence of dilitation. Venous: The inferior vena cava is normal in size with greater than 50% respiratory variability, suggesting right atrial pressure of 3 mmHg. IAS/Shunts: No atrial level shunt detected by color flow Doppler.  LEFT VENTRICLE PLAX 2D LVIDd:         4.10 cm   Diastology LVIDs:         2.50 cm   LV e' medial:    8.06 cm/s LV PW:         0.60 cm   LV E/e' medial:  17.2 LV IVS:        0.70 cm   LV e' lateral:   8.70 cm/s LVOT diam:     1.80 cm   LV E/e' lateral: 16.0 LV SV:         68 LV SV Index:   42 LVOT Area:     2.54 cm  RIGHT VENTRICLE TAPSE (M-mode): 2.1 cm LEFT ATRIUM             Index        RIGHT ATRIUM           Index LA diam:        3.80 cm 2.33 cm/m   RA Area:     15.50 cm LA Vol (A2C):   44.9 ml 27.56 ml/m  RA Volume:   38.10 ml  23.39 ml/m LA Vol (A4C):   45.5 ml 27.93 ml/m LA Biplane Vol: 47.1 ml 28.91 ml/m  AORTIC VALVE AV Area (Vmax):    1.23 cm AV Area (Vmean):   1.22 cm AV Area (VTI):     1.21 cm AV Vmax:           251.00 cm/s AV Vmean:          172.500 cm/s AV VTI:            0.563 m AV Peak Grad:      25.2 mmHg AV Mean Grad:      13.5 mmHg LVOT Vmax:         121.00 cm/s LVOT Vmean:        83.000 cm/s LVOT VTI:          0.267 m LVOT/AV VTI ratio: 0.47  AORTA Ao Root diam: 2.60 cm Ao Asc diam:  2.60 cm MITRAL VALVE                TRICUSPID VALVE MV Area (PHT): 3.97 cm     TR Peak grad:   33.2 mmHg MV Decel Time: 191 msec     TR Vmax:        288.00 cm/s MV E velocity: 139.00 cm/s MV A velocity:  126.00 cm/s  SHUNTS MV E/A ratio:  1.10         Systemic VTI:  0.27 m                             Systemic Diam: 1.80 cm Dorris Carnes MD Electronically signed by Dorris Carnes MD Signature Date/Time: 06/07/2022/4:42:06 PM    Final    VAS [place] LOWER EXTREMITY VENOUS (DVT)  Result Date: 06/07/2022  Lower Venous DVT Study Patient Name:  Joanne Carlson  Date of Exam:   06/07/2022 Medical Rec #: [id number]  Accession #:    [id number] Date of Birth: [birth year]          Patient Gender: F Patient Age:   35 years Exam Location:  [facility] Procedure:      VAS [place] LOWER EXTREMITY VENOUS (DVT) Referring Phys: Fuller Plan --------------------------------------------------------------------------------  Indications: Pulmonary embolism.  Risk Factors: Confirmed PE None identified. Anticoagulation: Heparin. Limitations: Poor ultrasound/tissue interface. Comparison Study: No prior studies. Performing Technologist: Oliver Hum RVT  Examination Guidelines: A complete evaluation includes B-mode imaging, spectral Doppler, color Doppler, and power Doppler as needed of all accessible portions of each vessel. Bilateral testing is considered an integral part of a complete examination. Limited examinations for reoccurring indications may be performed as noted. The reflux portion of the exam is performed with the patient in reverse Trendelenburg.  +---------+---------------+---------+-----------+----------+--------------+ RIGHT    CompressibilityPhasicitySpontaneityPropertiesThrombus Aging +---------+---------------+---------+-----------+----------+--------------+ CFV      Partial        Yes      No                   Acute          +---------+---------------+---------+-----------+----------+--------------+ SFJ      Full                                                        +---------+---------------+---------+-----------+----------+--------------+ FV Prox  Full                                                         +---------+---------------+---------+-----------+----------+--------------+ FV Mid   Full                                                        +---------+---------------+---------+-----------+----------+--------------+ FV Distal               Yes      Yes                                 +---------+---------------+---------+-----------+----------+--------------+ PFV      Partial        Yes      No                   Acute          +---------+---------------+---------+-----------+----------+--------------+ POP      Full           Yes      No                                  +---------+---------------+---------+-----------+----------+--------------+ PTV      Full                                                        +---------+---------------+---------+-----------+----------+--------------+  PERO     Full                                                        +---------+---------------+---------+-----------+----------+--------------+ EIV                     Yes      No                                  +---------+---------------+---------+-----------+----------+--------------+ CIV                     Yes      No                                  +---------+---------------+---------+-----------+----------+--------------+   +---------+---------------+---------+-----------+----------+--------------+ LEFT     CompressibilityPhasicitySpontaneityPropertiesThrombus Aging +---------+---------------+---------+-----------+----------+--------------+ CFV      Full           Yes      No                                  +---------+---------------+---------+-----------+----------+--------------+ SFJ      Full                                                        +---------+---------------+---------+-----------+----------+--------------+ FV Prox  Full                                                         +---------+---------------+---------+-----------+----------+--------------+ FV Mid                  Yes      No                                  +---------+---------------+---------+-----------+----------+--------------+ FV DistalFull           Yes      No                                  +---------+---------------+---------+-----------+----------+--------------+ PFV      Full                                                        +---------+---------------+---------+-----------+----------+--------------+ POP      Full           Yes      No                                  +---------+---------------+---------+-----------+----------+--------------+  PTV      Full                                                        +---------+---------------+---------+-----------+----------+--------------+ PERO     Full                                                        +---------+---------------+---------+-----------+----------+--------------+     Summary: RIGHT: - Findings consistent with acute deep vein thrombosis involving the right common femoral vein, and right proximal profunda vein. - No cystic structure found in the popliteal fossa.  LEFT: - There is no evidence of deep vein thrombosis in the lower extremity.  - No cystic structure found in the popliteal fossa.  *See table(s) above for measurements and observations. Electronically signed by Servando Snare MD on 06/07/2022 at 4:37:37 PM.    Final    CT ABDOMEN PELVIS W CONTRAST  Result Date: 06/07/2022 CLINICAL DATA:  Abdominal pain. History of bladder cancer. * Tracking Code: BO * EXAM: CT ABDOMEN AND PELVIS WITH CONTRAST TECHNIQUE: Multidetector CT imaging of the abdomen and pelvis was performed using the standard protocol following bolus administration of intravenous contrast. RADIATION DOSE REDUCTION: This exam was performed according to the departmental dose-optimization program which includes automated exposure control,  adjustment of the mA and/or kV according to patient size and/or use of iterative reconstruction technique. CONTRAST:  79m OMNIPAQUE IOHEXOL 350 MG/ML SOLN COMPARISON:  01/13/2022 FINDINGS: Lower chest: Please see report for chest CTA performed at the same time and dictated separately. Hepatobiliary: Interval development of innumerable hypoenhancing liver lesions ranging in size from several mm up to about 13 mm maximum size. Imaging features compatible with metastatic disease. Gallbladder is distended with layering tiny stones or sludge towards the fundus. No intrahepatic or extrahepatic biliary dilation. Pancreas: No focal mass lesion. No dilatation of the main duct. No intraparenchymal cyst. No peripancreatic edema. Spleen: No splenomegaly. No focal mass lesion. Adrenals/Urinary Tract: No adrenal nodule or mass. Right kidney surgically absent. Stable 2.6 cm simple cyst interpolar left kidney with left moderate hydroureteronephrosis. Left ureter is dilated down to the UVJ region. As on the prior study, there is a large heterogeneously enhancing mass lesion occupying the location of the bladder. This was measured previously at 8.9 x 8.3 cm which compares 2 8.3 x 8.3 cm today when measured in a similar fashion. Stomach/Bowel: Stomach is unremarkable. No gastric wall thickening. No evidence of outlet obstruction. Duodenum is normally positioned as is the ligament of Treitz. No small bowel wall thickening. No small bowel dilatation. The terminal ileum is normal. The appendix is not well visualized, but there is no edema or inflammation in the region of the cecum. No gross colonic mass. No colonic wall thickening. Vascular/Lymphatic: There is advanced atherosclerotic calcification of the abdominal aorta without aneurysm. There is no gastrohepatic or hepatoduodenal ligament lymphadenopathy. 10 mm short axis retrocaval lymph node on 35/3 is new in the interval. 10 mm short axis left para-aortic lymph node on 45/3 is new  since prior. 15 mm short axis right external iliac node on 67/3 was 7 mm short axis previously. 16 mm short axis  left external iliac node on 65/3 was 5-6 mm previously. Reproductive: There is no adnexal mass. Other: No substantial intraperitoneal free fluid. Musculoskeletal: Small umbilical hernia contains only fat. No worrisome lytic or sclerotic osseous abnormality. IMPRESSION: 1. Interval development of innumerable hypoenhancing liver lesions ranging in size from several mm up to about 13 mm maximum size. Imaging features compatible with metastatic disease. 2. Interval progression of retroperitoneal and bilateral external iliac lymphadenopathy, consistent with metastatic disease. 3. Similar appearance of a large heterogeneously enhancing mass lesion occupying the location of the bladder. Moderate left hydroureteronephrosis with left ureter dilated down to the UVJ region, similar to prior. 4. Status post right nephrectomy. 5. Small umbilical hernia contains only fat. 6.  Aortic Atherosclerosis (ICD10-I70.0). Electronically Signed   By: Misty Stanley M.D.   On: 06/07/2022 11:16   CT Angio Chest Pulmonary Embolism (PE) W or WO Contrast  Result Date: 06/07/2022 CLINICAL DATA:  PE suspected EXAM: CT ANGIOGRAPHY CHEST WITH CONTRAST TECHNIQUE: Multidetector CT imaging of the chest was performed using the standard protocol during bolus administration of intravenous contrast. Multiplanar CT image reconstructions and MIPs were obtained to evaluate the vascular anatomy. RADIATION DOSE REDUCTION: This exam was performed according to the departmental dose-optimization program which includes automated exposure control, adjustment of the mA and/or kV according to patient size and/or use of iterative reconstruction technique. CONTRAST:  IV contrast was used to improve disease detection. COMPARISON:  CT AP 01/13/22 FINDINGS: Cardiovascular: Satisfactory opacification of the pulmonary arteries to the segmental level. There is  an acute PE in a right lower lobe segmental pulmonary artery (series 6, image 146). RV/LV ratio >1. No pericardial effusion. Mediastinum/Nodes: No enlarged mediastinal, or axillary lymph nodes. There is a enlarged 1.6 cm right hilar lymph node (series 6, image 125). Thyroid gland, trachea, and esophagus demonstrate no significant findings. Lungs/Pleura: No pleural effusion. No pneumothorax. Bibasilar atelectasis. 12Multiple bilateral pulmonary nodules, which are either new or enlarged compared to prior CT abdomen and pelvis dated 01/14/2019. Index nodules include a 4 mm nodule in the right upper lobe (series 6, image 67), a 6 mm nodule in the right upper lobe (series 6, image 96), a 1.2 cm perifissural right lower lobe pulmonary nodule (series 6, image 99), a 1.4 cm left lower lobe pulmonary nodule (series 6, image 133), a 1.3 cm right lower lobe pulmonary nodule (series 6, image 160). Upper Abdomen: The see separately dictated CT abdomen and pelvis for additional findings Musculoskeletal: There is a 1.9 x 1.9 cm subcutaneous hypodense lesion in the right axilla (series 6, image 25). Review of the MIP images confirms the above findings. IMPRESSION: 1. Acute PE in a right lower lobe segmental pulmonary artery. RV/LV ratio is greater than 1, which is suggestive of right heart strain. 2. Multiple bilateral pulmonary nodules, which are either new or enlarged compared to prior CT abdomen and pelvis dated 01/13/2022, concerning for worsening pulmonary metastatic disease. 3. Enlarged right hilar lymph node, also concerning for metastatic disease. 4. Indeterminate 1.9 cm subcutaneous hypodense lesion in the right axilla, nonspecific. Recommend correlation with physical exam. 5. Please see same day CT AP for additional findings. Electronically Signed   By: Marin Roberts M.D.   On: 06/07/2022 11:16   DG Chest 2 View  Result Date: 06/07/2022 CLINICAL DATA:  Chest pain. EXAM: CHEST - 2 VIEW COMPARISON:  January 21, 2016  FINDINGS: The heart size and mediastinal contours are within normal limits. There is marked severity calcification of the aortic arch. Low lung  volumes are noted. Mild atelectasis is seen within the bilateral lung bases. There is no evidence of focal consolidation, pleural effusion or pneumothorax. The visualized skeletal structures are unremarkable. IMPRESSION: Low lung volumes with mild bibasilar atelectasis. Electronically Signed   By: Virgina Norfolk M.D.   On: 06/07/2022 00:59   MM 3D SCREEN BREAST BILATERAL  Result Date: 05/13/2022 CLINICAL DATA:  Screening. EXAM: DIGITAL SCREENING BILATERAL MAMMOGRAM WITH TOMOSYNTHESIS AND CAD TECHNIQUE: Bilateral screening digital craniocaudal and mediolateral oblique mammograms were obtained. Bilateral screening digital breast tomosynthesis was performed. The images were evaluated with computer-aided detection. COMPARISON:  Previous exam(s). ACR Breast Density Category b: There are scattered areas of fibroglandular density. FINDINGS: There are no findings suspicious for malignancy. IMPRESSION: No mammographic evidence of malignancy. A result letter of this screening mammogram will be mailed directly to the patient. RECOMMENDATION: Screening mammogram in one year. (Code:[id number]) BI-RADS CATEGORY  1: Negative. Electronically Signed   By: Ammie Ferrier M.D.   On: 05/13/2022 15:55    Microbiology: Results for orders placed or performed during the hospital encounter of 08/29/21  Blood culture (routine x 2)     Status: None   Collection Time: 08/29/21  5:19 PM   Specimen: BLOOD  Result Value Ref Range Status   Specimen Description   Final    BLOOD BLOOD LEFT ARM Performed at [facility] [address], [place], [place] [address]    Special Requests   Final    BOTTLES DRAWN AEROBIC AND ANAEROBIC Blood Culture adequate volume Performed at [facility] [address], [place], [place] [address]    Culture   Final     NO GROWTH 5 DAYS Performed at [facility], [place] [address], [place], [place] [address]    Report Status 09/03/2021 FINAL  Final  Resp Panel by RT-PCR (Flu A&B, Covid) Nasopharyngeal Swab     Status: None   Collection Time: 08/29/21  5:19 PM   Specimen: Nasopharyngeal Swab; Nasopharyngeal(NP) swabs in vial transport medium  Result Value Ref Range Status   SARS Coronavirus 2 by RT PCR NEGATIVE NEGATIVE Final    Comment: (NOTE) SARS-CoV-2 target nucleic acids are NOT DETECTED.  The SARS-CoV-2 RNA is generally detectable in upper respiratory specimens during the acute phase of infection. The lowest concentration of SARS-CoV-2 viral copies this assay can detect is 138 copies/mL. A negative result does not preclude SARS-Cov-2 infection and should not be used as the sole basis for treatment or other patient management decisions. A negative result may occur with  improper specimen collection/handling, submission of specimen other than nasopharyngeal swab, presence of viral mutation(s) within the areas targeted by this assay, and inadequate number of viral copies(<138 copies/mL). A negative result must be combined with clinical observations, patient history, and epidemiological information. The expected result is Negative.  Fact Sheet for Patients:  EntrepreneurPulse.com.au  Fact Sheet for Healthcare Providers:  IncredibleEmployment.be  This test is no t yet approved or cleared by the [place] FDA and  has been authorized for detection and/or diagnosis of SARS-CoV-2 by FDA under an Emergency Use Authorization (EUA). This EUA will remain  in effect (meaning this test can be used) for the duration of the COVID-19 declaration under Section 564(b)(1) of the Act, 21 U.S.C.section 360bbb-3(b)(1), unless the authorization is terminated  or revoked sooner.       Influenza A by PCR NEGATIVE NEGATIVE Final   Influenza B by PCR NEGATIVE NEGATIVE  Final    Comment: (  NOTE) The Xpert Xpress SARS-CoV-2/FLU/RSV plus assay is intended as an aid in the diagnosis of influenza from Nasopharyngeal swab specimens and should not be used as a sole basis for treatment. Nasal washings and aspirates are unacceptable for Xpert Xpress SARS-CoV-2/FLU/RSV testing.  Fact Sheet for Patients: EntrepreneurPulse.com.au  Fact Sheet for Healthcare Providers: IncredibleEmployment.be  This test is not yet approved or cleared by the [place] FDA and has been authorized for detection and/or diagnosis of SARS-CoV-2 by FDA under an Emergency Use Authorization (EUA). This EUA will remain in effect (meaning this test can be used) for the duration of the COVID-19 declaration under Section 564(b)(1) of the Act, 21 U.S.C. section 360bbb-3(b)(1), unless the authorization is terminated or revoked.  Performed at [facility], [place] [address] [place], [place] [address]   Blood culture (routine x 2)     Status: None   Collection Time: 08/29/21 10:30 PM   Specimen: BLOOD  Result Value Ref Range Status   Specimen Description BLOOD SITE NOT SPECIFIED  Final   Special Requests   Final    BOTTLES DRAWN AEROBIC AND ANAEROBIC Blood Culture adequate volume   Culture   Final    NO GROWTH 5 DAYS Performed at [facility], [address] [place], [place] [address]    Report Status 09/03/2021 FINAL  Final  Surgical pcr screen     Status: None   Collection Time: 09/02/21  5:14 AM   Specimen: Nasal Mucosa; Nasal Swab  Result Value Ref Range Status   MRSA, PCR NEGATIVE NEGATIVE Final   Staphylococcus aureus NEGATIVE NEGATIVE Final    Comment: (NOTE) The Xpert SA Assay (FDA approved for NASAL specimens in patients 82 years of age and older), is one component of a comprehensive surveillance program. It is not intended to diagnose infection nor to guide or monitor treatment. Performed at [facility], [place] [address] [place], [place] [address]     Labs: CBC: Recent Labs  Lab 06/07/22 0012 06/08/22 0535 06/09/22 0514 06/10/22 0052  WBC 18.5* 20.6* 18.8* 18.2*  NEUTROABS  --   --   --  14.4*  HGB 11.8* 10.7* 10.4* 10.5*  HCT 38.2 33.8* 32.7* 31.8*  MCV 90.5 89.7 87.2 86.2  PLT 426* 372 323 920   Basic Metabolic Panel: Recent Labs  Lab 06/07/22 0012 06/08/22 0656 06/09/22 0514 06/10/22 0052  NA 135 132* 131* 132*  K 5.6* 5.3* 4.1 4.6  CL 91* 94* 91* 92*  CO2 _0 GLUCOSE 138* 66* 77 104*  BUN 43* 57* 30* 44*  CREATININE 5.62* 7.74* 5.01* 6.11*  CALCIUM 8.5* 8.2* 8.1* 8.2*  MG  --   --  1.8 1.9  PHOS  --  4.1  --   --    Liver Function Tests: Recent Labs  Lab 06/08/22 0656  ALBUMIN 1.7*   CBG: No results for input(s): "GLUCAP" in the last 168 hours.  Discharge time spent: greater than 30 minutes.  Signed: Patrecia Pour, MD [facility] 06/10/2022

## 2022-06-10 NOTE — Progress Notes (Signed)
I was asked by nursing staff to assess Joanne Carlson for AMS and confusion. Pt recently returned from HD approximately 2100. LKW would be prior to pt leaving for HD. RN noted pt to be confused while preparing for discharge. Pt is on Eliquis and received dose earlier today approximately 1130. Upon arrival, pt is alert, sitting up in bed. CBG 88. Bilateral upper extremities equal in strength. Generalized weakness in both lower extremities but equal. Pt is oriented to person, age but disoriented to time, place and situation. Pt could identify simple objects and her daughter who is at the bedside. Pt did have some delayed responses, however would answer when stimulated persistently. I did not activate code stroke due to pt on Eliquis and no LVO symptoms. Pt is set up with Outpatient Palliative care.  NIH 5: 1b-1 (month) 6a-2 (left leg) 6b-2 (right leg)  I notified Dr. Bridgett Larsson of my assessment. Informed her daughter at bedside and answered questions.

## 2022-06-10 NOTE — Progress Notes (Signed)
ANTICOAGULATION CONSULT NOTE - Follow Up Consult  Pharmacy Consult for heparin Indication: pulmonary embolus  Labs: Recent Labs    06/07/22 0618 06/07/22 2117 06/08/22 0535 06/08/22 0656 06/08/22 1715 06/09/22 0514 06/09/22 1431 06/10/22 0051 06/10/22 0052  HGB  --    < > 10.7*  --   --  10.4*  --   --  10.5*  HCT  --   --  33.8*  --   --  32.7*  --   --  31.8*  PLT  --   --  372  --   --  323  --   --  314  HEPARINUNFRC  --    < > <0.10*  --    < > 0.83* 0.19* 0.28*  --   CREATININE  --   --   --  7.74*  --  5.01*  --   --  6.11*  TROPONINIHS 13  --   --   --   --   --   --   --   --    < > = values in this interval not displayed.    Assessment: 76yo female subtherapeutic on heparin after rate change; no infusion issues per RN though she does note that pt has not urinated this shift to assess hematuria.  Goal of Therapy:  Heparin level 0.3-0.7 units/ml   Plan:  Will increase heparin infusion slightly to 1600 units/hr and check level in 8 hours.    Wynona Neat, PharmD, BCPS  06/10/2022,2:20 AM

## 2022-06-10 NOTE — Progress Notes (Signed)
Mobility Specialist - Progress Note   06/10/22 1402  Mobility  Activity Off unit   Pt currently in hemodialysis. Will follow up if time permits.  Franki Monte  Mobility Specialist Please contact via Solicitor or Rehab office at [phone]

## 2022-06-10 NOTE — TOC Initial Note (Signed)
Transition of Care [facility]) - Initial/Assessment Note    Patient Details  Name: Joanne Carlson MRN: [id number] Date of Birth: 1945/08/05  [facility] of [facility]) CM/SW Contact:    Bethena Roys, RN Phone Number: 06/10/2022, 11:37 AM  Clinical Narrative: Risk for readmission assessment completed. PTA patient states she was from home alone; however, has support of her son and daughter. Patient states she does not drive and children take her to appointments, gets her groceries, and medications. Case Manager received a referral for outpatient palliative. Case Manager did discuss with the patient and she chose [facility] for Outpatient Palliative- referral submitted to the hospital liaison. The office will call the patient once she gets home. Patient states she will have transportation home once she is ready to transition home. No further needs identified at this time.                 Expected Discharge Plan: Home/Self Care Barriers to Discharge: No Barriers Identified   Patient Goals and CMS Choice Patient states their goals for this hospitalization and ongoing recovery are:: patient to return home   Choice offered to / list presented to : NA  Expected Discharge Plan and Services Expected Discharge Plan: Home/Self Care In-house Referral: NA Discharge Planning Services: CM Consult Post Acute Care Choice:  (Outpatient Palliative Services.) Living arrangements for the past 2 months: Apartment                   DME Agency: NA       HH Arranged: NA HH Agency: [facility] Date HH Agency Contacted: 06/10/22 Time HH Agency Contacted: 82 Representative spoke with at [place]: [place] Arrangements/Services Living arrangements for the past 2 months: [place] with:: Self (has support of daughter and son.) Patient language and need for interpreter reviewed:: Yes Do you feel safe going back to the place where you live?: Yes       Need for Family Participation in Patient Care: Yes (Comment) Care giver support system in place?: Yes (comment)   Criminal Activity/Legal Involvement Pertinent to Current Situation/Hospitalization: No - Comment as needed  Activities of Daily Living Home Assistive Devices/Equipment: Environmental consultant (specify type), Wheelchair ADL Screening (condition at time of admission) Patient's cognitive ability adequate to safely complete daily activities?: Yes Is the patient deaf or have difficulty hearing?: No Does the patient have difficulty seeing, even when wearing glasses/contacts?: No Does the patient have difficulty concentrating, remembering, or making decisions?: No Patient able to express need for assistance with ADLs?: Yes Does the patient have difficulty dressing or bathing?: Yes Independently performs ADLs?: Yes (appropriate for developmental age) Does the patient have difficulty walking or climbing stairs?: No Weakness of Legs: None Weakness of Arms/Hands: None  Permission Sought/Granted Permission sought to share information with : Case Manager, Family Supports, Chartered certified accountant granted to share information with : Yes, Verbal Permission Granted     Permission granted to share info w AGENCY: [facility]        Emotional Assessment Appearance:: Appears stated age Attitude/Demeanor/Rapport: Engaged Affect (typically observed): Appropriate Orientation: : Oriented to Situation, Oriented to  Time, Oriented to Self Alcohol / Substance Use: Not Applicable Psych Involvement: No (comment)  Admission diagnosis:  Pulmonary embolus ([place]) [I26.99] Pulmonary embolism ([place]) [I26.99] Acute pulmonary embolism, unspecified pulmonary embolism type, unspecified whether acute cor pulmonale present [facility]) [I26.99] Patient Active Problem List   Diagnosis Date Noted   Pulmonary  embolism ([place]) 06/08/2022   Pulmonary embolus ([place]) 06/07/2022   DVT (deep venous  thrombosis) ([place]) 06/07/2022   Leukocytosis 06/07/2022   Transient hypotension 06/07/2022   Diabetic foot infection ([place]) 10/02/2021   Cellulitis    Medication monitoring encounter    Osteomyelitis ([place]) 08/29/2021   PVD (peripheral vascular disease) ([place]) 08/29/2021   Bladder cancer ([place]) 07/22/2021   Aortic stenosis 04/08/2021   Lumbar radiculopathy 10/15/2020   Lumbar pain 10/15/2020   Hypercalcemia 04/25/2020   Claudication in peripheral vascular disease ([place]) 03/03/2020   Closed fracture of base of proximal phalanx of finger 01/23/2020   Right wrist pain 01/09/2020   Pain of left hand 12/24/2019   Allergy, unspecified, initial encounter 02/05/2019   Anaphylactic shock, unspecified, initial encounter 02/05/2019   Encounter for removal of sutures 07/15/2018   Iron deficiency anemia, unspecified 02/27/2018   Atherosclerosis of native arteries of the extremities with gangrene ([place]) 11/23/2017   Critical lower limb ischemia ([place]) 01/07/2017   Shortness of breath 12/04/2016   Unspecified protein-calorie malnutrition ([place]) 12/02/2016   Encounter for immunization 11/22/2016   Dependence on renal dialysis ([place]) 11/16/2016   Coagulation defect, unspecified ([place]) 10/19/2016   Dorsalgia, unspecified 10/12/2016   Gout, unspecified 10/12/2016   Hyperkalemia 10/12/2016   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 stage renal disease ([place]) 10/12/2016   Morbid (severe) obesity due to excess calories ([place]) 10/12/2016   Secondary hyperparathyroidism of renal origin ([place]) 10/12/2016   Chronic renal insufficiency, stage IV (severe) ([place]) 11/13/2013   End stage renal disease ([place]) 07/20/2013   Mechanical complication of other vascular device, implant, and graft 07/20/2013   Other specified disorders of kidney and ureter 11/30/2011   History of recurrent transient ischemic attacks 09/27/2011   HYPERCHOLESTEROLEMIA 08/07/2008   ANEMIA 08/07/2008   RENAL  INSUFFICIENCY 08/07/2008   ALLERGIC RHINITIS 03/04/2008   Type 2 diabetes mellitus without complication, with long-term current use of insulin ([place]) 03/15/2007   HYPERTENSION, BENIGN 03/15/2007   PCP:  Vernie Shanks, MD (Inactive) Pharmacy:   [facility], [place] [address] Phone: [phone] Fax: [facility]. [place] [address] Phone: [phone] Fax: [phone]   Readmission Risk Interventions    06/10/2022   11:35 AM 09/04/2021   11:18 AM  Readmission Risk Prevention Plan  Transportation Screening Complete Complete  PCP or Specialist Appt within 3-5 Days  Complete  HRI or [place]  Complete  Social Work Consult for [place] Planning/Counseling  Complete  Palliative Care Screening  Not Applicable  Medication Review Press photographer) Referral to Pharmacy Complete  HRI or [place] Complete   SW Recovery Care/Counseling Consult Complete   Palliative Care Screening Complete   [place] Not Applicable

## 2022-06-10 NOTE — Progress Notes (Signed)
Patient received from HD. Vital signs -BP 128/64 HR 93, Temp 98.3. Patient communicated appropriately and answered all questions. Patient stood to get dressed and needed more assistance than usual. Pt was very weak and needed 2 person assist to stand. This RN called MD to advise not comfortable to discharge in this state. Daughter arrived to pick up patient and also observed patient was not usual self. Pt is not as talkative as usual and somewhat confused. This RN called [facility] and notified MD. MD advised will cancel discharge.

## 2022-06-10 NOTE — Evaluation (Signed)
Occupational Therapy Evaluation Patient Details Name: Joanne Carlson MRN: [id number] DOB: October 12, 1945 Today's Date: 06/10/2022   History of Present Illness Pt is a 76 y.o. female admitted 06/06/22 with L-side abdominal pain, weight loss. Workup revealed RLE DVT, acute PE with R heart strain, multiple bilateral pulmonary nodules concerning for worsening metastatic disease. 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   Clinical Impression   Pt admitted a above currently at or near baseline level of Mod I for UB/LB ADL's, functional mobility and transfers as assessed today in sitting and standing at sink level. Pt education provided in use of shower seat for increased safety at home. Pt is agreeable to this. Pt reports supportive family that is able to assist intermittently during the day. Pt denies any further OT or DME needs after discussion and explanation at this time. She was encouraged to contact her MD/PCP should her needs change in the future for additional recommendations and possible DME/a/e. Will sign off acute OT, all needs met.     Recommendations for follow up therapy are one component of a multi-disciplinary discharge planning process, led by the attending physician.  Recommendations may be updated based on patient status, additional functional criteria and insurance authorization.   Follow Up Recommendations  No OT follow up     Assistance Recommended at Discharge PRN  Patient can return home with the following A little help with walking and/or transfers;A little help with bathing/dressing/bathroom    Functional Status Assessment  Patient has not had a recent decline in their functional status  Equipment Recommendations  Tub/shower seat    Recommendations for Other Services       Precautions / Restrictions Precautions Precautions: Fall Restrictions Weight Bearing  Restrictions: No      Mobility Bed Mobility Overal bed mobility: Modified Independent   General bed mobility comments: supine<>sit and sit to supine    Transfers Overall transfer level: Modified independent Equipment used: Rolling walker (2 wheels), Rollator (4 wheels)   General transfer comment: good awareness to lock/unlock rollator brakes without cues      Balance Overall balance assessment: Needs assistance Sitting-balance support: No upper extremity supported, Feet supported, Feet unsupported Sitting balance-Leahy Scale: Good Sitting balance - Comments: indep to don/doff bilateral socks sitting EOB   Standing balance support: No upper extremity supported, During functional activity Standing balance-Leahy Scale: Fair Standing balance comment: can stand and take steps without UE support, stood at sink for grooming, bathing UB/LB     ADL either performed or assessed with clinical judgement   ADL Overall ADL's : At baseline;Modified independent   General ADL Comments: Pt was seen for functional mobility in room, bathroom, stood to bathe UB, LB and groomed at sink with overall Mod I - supervision assist (secondary to IV/lines only). Pt was able to dress UB and LB in sitting at EOB (don/doff socks and clean gown, as well as pull up, after bathing in standing at sink) at Mod I level overall. Discussed tub transfer and currently recommend shower seat for use at home (pt reports that tub bench would not fit in smaller bathroom). Pt denies any other needs at this time as she reports supportive family and implementing energy conservation techniques PRN at home. Will sign off acute OT at ths time. Thank you for this referral.     Vision Baseline Vision/History: 1 Wears glasses Patient Visual Report: No change from baseline  Pertinent Vitals/Pain Pain Assessment Pain Assessment: No/denies pain     Hand Dominance Right   Extremity/Trunk Assessment Upper Extremity  Assessment Upper Extremity Assessment: Generalized weakness;Overall [facility] for tasks assessed   Lower Extremity Assessment Lower Extremity Assessment: Defer to PT evaluation;Generalized weakness   Cervical / Trunk Assessment Cervical / Trunk Assessment: Kyphotic   Communication Communication Communication: HOH   Cognition Arousal/Alertness: Awake/alert Behavior During Therapy: WFL for tasks assessed/performed Overall Cognitive Status: Within Functional Limits for tasks assessed   General Comments: WFL for simple tasks, not formally assessed; suspect baseline cognition                Home Living Family/patient expects to be discharged to:: Private residence Living Arrangements: Children Available Help at Discharge: Family;Available PRN/intermittently Type of Home: Apartment Home Access: Elevator     Home Layout: One level     Bathroom Shower/Tub: Teacher, early years/pre: Standard     Home Equipment: Rollator (4 wheels);Grab bars - tub/shower   Additional Comments: lives in senior living apt complex. son has been staying with pt, he assists with meals      Prior Functioning/Environment Prior Level of Function : Independent/Modified Independent   Mobility Comments: mod indep ambulator with or without rollator in apartment, uses rollator for walking around apartment building. daughter drives, pt holds onto cart walking in grocery store ADLs Comments: reports standing to shower. son assists with meals; daughter checks on frequently, assists with housekeeping. pt reports she's indep with med management       OT Problem List: Decreased knowledge of use of DME or AE;Decreased activity tolerance      OT Treatment/Interventions:   Pt education, demonstration and performance   OT Goals(Current goals can be found in the care plan section) Acute Rehab OT Goals Patient Stated Goal: Go home to apartment when able OT Goal Formulation: All assessment and education  complete, DC therapy Time For Goal Achievement: 06/10/22 Potential to Achieve Goals: Good  OT Frequency:         AM-PAC OT "6 Clicks" Daily Activity     Outcome Measure Help from another person eating meals?: None Help from another person taking care of personal grooming?: None Help from another person toileting, which includes using toliet, bedpan, or urinal?: None Help from another person bathing (including washing, rinsing, drying)?: None (recommend bathing in sitting at sink or use of shower chair for safety) Help from another person to put on and taking off regular upper body clothing?: None Help from another person to put on and taking off regular lower body clothing?: None 6 Click Score: 24   End of Session Equipment Utilized During Treatment: Rolling walker (2 wheels) Nurse Communication: Mobility status;Other (comment) (Pt with bleeding when changing pull up "It's from the bladder CA" per pt report)  Activity Tolerance: Patient tolerated treatment well Patient left: in bed;with call bell/phone within reach;with bed alarm set  OT Visit Diagnosis: Muscle weakness (generalized) (M62.81)                Time: 0932-3557 OT Time Calculation (min): 49 min Charges:  OT Evaluation $OT Eval Low Complexity: 1 Low OT Treatments $Self Care/Home Management : 8-22 mins  Rosmarie Esquibel Beth Dixon, OTR/L 06/10/2022, 9:05 AM

## 2022-06-10 NOTE — Discharge Instructions (Addendum)
Information on my medicine - ELIQUIS (apixaban)   Why was Eliquis prescribed for you? Eliquis was prescribed to treat blood clots that may have been found in the veins of your legs (deep vein thrombosis) or in your lungs (pulmonary embolism) and to reduce the risk of them occurring again.  What do You need to know about Eliquis ? The starting dose is 10 mg (two 5 mg tablets) taken TWICE daily for the FIRST FOUR (4) DAYS, then on 06/14/22  the dose is reduced to ONE 5 mg tablet taken TWICE daily.  Eliquis may be taken with or without food.   Try to take the dose about the same time in the morning and in the evening. If you have difficulty swallowing the tablet whole please discuss with your pharmacist how to take the medication safely.  Take Eliquis exactly as prescribed and DO NOT stop taking Eliquis without talking to the doctor who prescribed the medication.  Stopping may increase your risk of developing a new blood clot.  Refill your prescription before you run out.  After discharge, you should have regular check-up appointments with your healthcare provider that is prescribing your Eliquis.    What do you do if you miss a dose? If a dose of ELIQUIS is not taken at the scheduled time, take it as soon as possible on the same day and twice-daily administration should be resumed. The dose should not be doubled to make up for a missed dose.  Important Safety Information A possible side effect of Eliquis is bleeding. You should call your healthcare provider right away if you experience any of the following: Bleeding from an injury or your nose that does not stop. Unusual colored urine (red or dark brown) or unusual colored stools (red or black). Unusual bruising for unknown reasons. A serious fall or if you hit your head (even if there is no bleeding).  Some medicines may interact with Eliquis and might increase your risk of bleeding or clotting while on Eliquis. To help avoid  this, consult your healthcare provider or pharmacist prior to using any new prescription or non-prescription medications, including herbals, vitamins, non-steroidal anti-inflammatory drugs (NSAIDs) and supplements.  This website has more information on Eliquis (apixaban): http://www.eliquis.com/eliquis/home

## 2022-06-10 NOTE — Progress Notes (Signed)
D/C order noted. Pt due for HD today. Inquired if pt would be appropriate for out-pt HD tomorrow at clinic but it was felt that pt required inpt HD today. Contacted [name] to advise clinic of pt's d/c today and that pt should resume care on Saturday (6:05 arrival/6:25 am chair time).   Melven Sartorius Renal Navigator [phone]

## 2022-06-11 ENCOUNTER — Other Ambulatory Visit (HOSPITAL_COMMUNITY): Payer: Self-pay

## 2022-06-11 LAB — BASIC METABOLIC PANEL
Anion gap: 12 (ref 5–15)
BUN: 20 mg/dL (ref 8–23)
CO2: 30 mmol/L (ref 22–32)
Calcium: 8.2 mg/dL — ABNORMAL LOW (ref 8.9–10.3)
Chloride: 93 mmol/L — ABNORMAL LOW (ref 98–111)
Creatinine, Ser: 3.99 mg/dL — ABNORMAL HIGH (ref 0.44–1.00)
GFR, Estimated: 11 mL/min — ABNORMAL LOW (ref >60)
Glucose, Bld: 75 mg/dL (ref 70–99)
Potassium: 4 mmol/L (ref 3.5–5.1)
Sodium: 135 mmol/L (ref 135–145)

## 2022-06-11 LAB — MAGNESIUM: Magnesium: 1.5 mg/dL — ABNORMAL LOW (ref 1.7–2.4)

## 2022-06-11 LAB — CBC
HCT: 31.8 % — ABNORMAL LOW (ref 36.0–46.0)
Hemoglobin: 10.4 g/dL — ABNORMAL LOW (ref 12.0–15.0)
MCH: 28 pg (ref 26.0–34.0)
MCHC: 32.7 g/dL (ref 30.0–36.0)
MCV: 85.7 fL (ref 80.0–100.0)
Platelets: 326 10*3/uL (ref 150–400)
RBC: 3.71 MIL/uL — ABNORMAL LOW (ref 3.87–5.11)
RDW: 17.2 % — ABNORMAL HIGH (ref 11.5–15.5)
WBC: 23.6 10*3/uL — ABNORMAL HIGH (ref 4.0–10.5)
nRBC: 0 % (ref 0.0–0.2)

## 2022-06-11 NOTE — Discharge Summary (Signed)
Physician Discharge Summary   Patient: Joanne Carlson MRN: [id number] DOB: 1946/06/19  Admit date:     06/06/2022  Discharge date: 06/11/22  Discharge Physician: Patrecia Pour   PCP: Vernie Shanks, MD (Inactive)   Recommendations at discharge:  Follow up with outpatient palliative care with [facility]. Follow up with PCP and consider recheck WBC. Continue routine HD. Note eliquis started for DVT/PE, previously on aspirin and plavix. Last stent placed by Dr. Gwenlyn Found Aug 2021. Given her persistent hematuria due to untreatable bladder tumor. Cardiology consulted and recommended discontinuing DAPT in this setting.   Discharge Diagnoses: Principal Problem:   Pulmonary embolus (HCC) Active Problems:   DVT (deep venous thrombosis) (HCC)   Leukocytosis   Transient hypotension   Hyperkalemia   End stage renal disease (HCC)   ANEMIA   PVD (peripheral vascular disease) (HCC)   Bladder cancer (HCC)   Pulmonary embolism [facility])  Hospital Course: Joanne Carlson is a 76 y.o. female with a history of HTN, HLD, ESRD (HD on TTS), PVD, T2DM, and metastatic bladder CA progressing despite therapy s/p TURBT who presented to the ED on 06/06/2022 with left side pleuritic pain. CT scan of the chest noted acute PE in the right lower lobe along with RV/LV ratio greater than 1 suggestive of right heart strain with multiple bilateral pulmonary nodules.  Patient was started on heparin drip.  Nephrology was consulted for dialysis. Palliative care was consulted for goals of care discussions and MOST form was completed. Patient is DNR. Her blood pressure is on the lower side, so midodrine is now prescribed even on nondialysis days. She was monitored for evidence of blood loss anemia or sepsis without local findings. No respiratory distress or hypoxia or tachycardia. She is tolerating anticoagulation and is stable for discharge.   She was discharged 12/7 and had dialysis late at night that evening,  experiencing delirium after this, so discharge was held. There were no focal deficits or vital sign instability. The following morning she is feeling well, at her normal mental status without complaints. Please see full details below.  Assessment and Plan: Acute PE and acute RLE DVT: Due to hypercoagulability from metastatic cancer. CT showing evidence of right heart strain but echocardiogram does not show any evidence of strain pattern. No hypoxemia. - Transitioned heparin > eliquis with total of 7 days load, then to maintenance dose.    Leukocytosis: Persistent since Jan 2023. She had a single isolation fever 12/5 without complaints of any respiratory, new urinary, or cutaneous infections. No meningismus. 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 No focal nidus of infection. Certainly malignancy and/or VTE can contribute to these findings. She's had no recurrent fever despite not receiving antipyretics or antimicrobial Tx.  - Return precautions reviewed, will continue to monitor off abx.      Hypotension: Reports she gets midodrine with HD but BP is soft currently. Asymptomatic. No tachycardia. Volume status is reasonable, no exam evidence of obstructive shock. Some hematuria at baseline which is not worse, hgb 10.4g/dl and stable. - Add midodrine to medications. Monitor for infection.    Hyperkalemia: Resolved.  - Nephrology consulted for dialysis   ESRD on HD: Patient remains on TTS schedule. - Nephrology consulted for dialysis while inpatient, continue regular medications.  Acute delirium: Following dialysis in the late evening of 12/7. This has resolved without interventions. Possibly related to hypoperfusion transiently.  Normocytic anemia of ESRD: - Hemoglobin stable >10.0   Peripheral vascular disease: Patient with prior history of stents - Discontinued plavix while on anticoagulation due to  increased risk of bleeding. Discussed with cardiology, Dr. Harl Bowie who reviewed the patient's case and records, recommends holding aspirin and plavix going forward since no stent placed within the past year. She will cc Dr. Gwenlyn Found to opine and let patient know if there is any different recommendation.   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  - Pt managed by urology, Dr. Tresa Moore, and oncology, Dr. Alen Blew who have recommended hospice care.  - Palliative care consulted and should continue following. Patient's quality of life is currently pretty good, so wants to continue dialysis which is reasonable.  Consultants: Nephrology, palliative care Procedures performed: HD  Disposition: Home Diet recommendation:  Renal diet DISCHARGE MEDICATION: Allergies as of 06/11/2022       Reactions   Blood-group Specific Substance    Tylenol [acetaminophen] Rash        Medication List     STOP taking these medications    clopidogrel 75 MG tablet Commonly known as: PLAVIX       TAKE these medications    Accu-Chek Aviva Plus w/Device Kit   allopurinol 100 MG tablet Commonly known as: ZYLOPRIM Take 100 mg by mouth daily.   aspirin EC 81 MG tablet Take 81 mg by mouth 4 (four) times a week.   atorvastatin 80 MG tablet Commonly known as: LIPITOR TAKE 1 TABLET BY MOUTH ONCE DAILY AT  6PM What changed:  how much to take how to take this when to take this additional instructions   cinacalcet 60 MG tablet Commonly known as: SENSIPAR Take 60 mg by mouth every Monday, Wednesday, and Friday.   Eliquis 5 MG Tabs tablet Generic drug: apixaban Take 2 tablets (10 mg total) by mouth 2 (two) times daily for 4 days, THEN  1 tablet (5 mg total) 2 (two) times daily for 26 days. Start taking on: June 10, 2022   gabapentin 100 MG capsule Commonly known as: NEURONTIN Take 1 capsule by mouth every morning and 1 capsule at bedtime. PATIENT NEEDS OFFICE VISIT FOR ADDITIONAL REFILLS What changed:  how much to take how to take this when to take this reasons to take this additional instructions   glucose blood test strip Use to test blood sugar daily. Dx code: 60.02.   Lancets Misc Use to test blood sugar daily. Dx code 250.02.   lidocaine-prilocaine cream Commonly known as: EMLA Apply 1 application topically Every Tuesday,Thursday,and Saturday with dialysis.   midodrine 5 MG tablet Commonly known as: PROAMATINE Take 1 tablet (5 mg total) by mouth 3 (three) times daily with meals.   multivitamin Tabs tablet Take 1 tablet by mouth daily.   oxybutynin 5 MG tablet Commonly known as: DITROPAN Take 1 tablet (5 mg total) by mouth 3 (three) times daily as needed for bladder spasms.   oxyCODONE 5 MG immediate release tablet Commonly known as: Roxicodone Take 1 tablet (5 mg total) by mouth every 8 (eight) hours as needed. What changed: reasons to take this   Velphoro 500 MG chewable tablet Generic drug: sucroferric oxyhydroxide Chew 500 mg by mouth 3 (three) times daily with meals.  Follow-up Information     [facility] Follow up.   Why: Outpatient Palliative-office to call with visit times. Contact information: [place] [id number] [phone]        Vernie Shanks, MD Follow up.   Specialty: Family Medicine Contact information: [place] [address] [phone]                Discharge Exam: Filed Weights   06/08/22 2200 06/10/22 1601  Weight: 55.7 kg 57.5 kg  BP (!) 87/40 (BP Location: Left Arm)   Pulse 76   Temp 98.4 F (36.9 C) (Oral)   Resp 14   Ht _0  (1.626 m)   Wt 57.5 kg   SpO2  98%   BMI 21.76 kg/m   Pleasant, oriented, interactive female in no distress Clear, nonlabored RRR, stable systolic murmur, no pitting edema Feet warm with normal cap refill, neuropathy noted. Left great toe amputation scar appears and feels normal. There are no open wounds, erythematous areas, induration or fluctuance.   Condition at discharge: stable  The results of significant diagnostics from this hospitalization (including imaging, microbiology, ancillary and laboratory) are listed below for reference.   Imaging Studies: ECHOCARDIOGRAM COMPLETE  Result Date: 06/07/2022    ECHOCARDIOGRAM REPORT   Patient Name:   ZAYNA TOSTE Date of Exam: 06/07/2022 Medical Rec #:  [id number]        Height:       64.0 in Accession #:    [id number]       Weight:       130.0 lb Date of Birth:  [birth year]         BSA:          1.629 m Patient Age:    77 years         BP:           133/60 mmHg Patient Gender: F                HR:           95 bpm. Exam Location:  Inpatient Procedure: 2D Echo, Cardiac Doppler and Color Doppler Indications:    Pulmonary Embolus I26.09  History:        Patient has prior history of Echocardiogram examinations, most                 recent 11/17/2020. PAD and Stroke; Risk Factors:Dyslipidemia and                 Hypertension. End stage renal disease.  Sonographer:    Darlina Sicilian RDCS Referring Phys: Delbert Phenix SMITH IMPRESSIONS  1. Left ventricular ejection fraction, by estimation, is 65 to 70%. The left ventricle has normal function. The left ventricle has no regional wall motion abnormalities. Left ventricular diastolic parameters are indeterminate.  2. Right ventricular systolic function is normal. The right ventricular size is normal.  3. The mitral valve is normal in structure. Trivial mitral valve regurgitation.  4. AV is thickened, calcified with restricted motion Peak and mean gradients through the valve are 29 and 14 mm Hg respectively AVA (VTI) is 1.21 cm2 Dimensionless index is  0.47 consistent wiht mild to moderate AS. Compared to echo from 2022, mean gradient across valve is mildly increased (11 to 14 mm HG). The aortic valve is tricuspid. Aortic valve regurgitation is not visualized.  5. The inferior vena cava is normal in size with greater than 50% respiratory  variability, suggesting right atrial pressure of 3 mmHg. FINDINGS  Left Ventricle: Left ventricular ejection fraction, by estimation, is 65 to 70%. The left ventricle has normal function. The left ventricle has no regional wall motion abnormalities. The left ventricular internal cavity size was normal in size. There is  no left ventricular hypertrophy. Left ventricular diastolic parameters are indeterminate. Right Ventricle: The right ventricular size is normal. Right vetricular wall thickness was not assessed. Right ventricular systolic function is normal. Left Atrium: Left atrial size was normal in size. Right Atrium: Right atrial size was normal in size. Pericardium: There is no evidence of pericardial effusion. Mitral Valve: The mitral valve is normal in structure. Trivial mitral valve regurgitation. Tricuspid Valve: The tricuspid valve is normal in structure. Tricuspid valve regurgitation is mild. Aortic Valve: AV is thickened, calcified with restricted motion Peak and mean gradients through the valve are 29 and 14 mm Hg respectively AVA (VTI) is 1.21 cm2 Dimensionless index is 0.47 consistent wiht mild to moderate AS. Compared to echo from 2022, mean gradient across valve is mildly increased (11 to 14 mm HG). The aortic valve is tricuspid. Aortic valve regurgitation is not visualized. Aortic valve mean gradient measures 13.5 mmHg. Aortic valve peak gradient measures 25.2 mmHg. Aortic valve area,  by VTI measures 1.21 cm. Pulmonic Valve: The pulmonic valve was normal in structure. Pulmonic valve regurgitation is not visualized. Aorta: The aortic root and ascending aorta are structurally normal, with no evidence of  dilitation. Venous: The inferior vena cava is normal in size with greater than 50% respiratory variability, suggesting right atrial pressure of 3 mmHg. IAS/Shunts: No atrial level shunt detected by color flow Doppler.  LEFT VENTRICLE PLAX 2D LVIDd:         4.10 cm   Diastology LVIDs:         2.50 cm   LV e' medial:    8.06 cm/s LV PW:         0.60 cm   LV E/e' medial:  17.2 LV IVS:        0.70 cm   LV e' lateral:   8.70 cm/s LVOT diam:     1.80 cm   LV E/e' lateral: 16.0 LV SV:         68 LV SV Index:   42 LVOT Area:     2.54 cm  RIGHT VENTRICLE TAPSE (M-mode): 2.1 cm LEFT ATRIUM             Index        RIGHT ATRIUM           Index LA diam:        3.80 cm 2.33 cm/m   RA Area:     15.50 cm LA Vol (A2C):   44.9 ml 27.56 ml/m  RA Volume:   38.10 ml  23.39 ml/m LA Vol (A4C):   45.5 ml 27.93 ml/m LA Biplane Vol: 47.1 ml 28.91 ml/m  AORTIC VALVE AV Area (Vmax):    1.23 cm AV Area (Vmean):   1.22 cm AV Area (VTI):     1.21 cm AV Vmax:           251.00 cm/s AV Vmean:          172.500 cm/s AV VTI:            0.563 m AV Peak Grad:      25.2 mmHg AV Mean Grad:      13.5 mmHg LVOT Vmax:  121.00 cm/s LVOT Vmean:        83.000 cm/s LVOT VTI:          0.267 m LVOT/AV VTI ratio: 0.47  AORTA Ao Root diam: 2.60 cm Ao Asc diam:  2.60 cm MITRAL VALVE                TRICUSPID VALVE MV Area (PHT): 3.97 cm     TR Peak grad:   33.2 mmHg MV Decel Time: 191 msec     TR Vmax:        288.00 cm/s MV E velocity: 139.00 cm/s MV A velocity: 126.00 cm/s  SHUNTS MV E/A ratio:  1.10         Systemic VTI:  0.27 m                             Systemic Diam: 1.80 cm Dorris Carnes MD Electronically signed by Dorris Carnes MD Signature Date/Time: 06/07/2022/4:42:06 PM    Final    VAS [place] LOWER EXTREMITY VENOUS (DVT)  Result Date: 06/07/2022  Lower Venous DVT Study Patient Name:  CANDITA BORENSTEIN Mccauley  Date of Exam:   06/07/2022 Medical Rec #: [id number]         Accession #:    [id number] Date of Birth: 12-25-1945          Patient Gender: F Patient  Age:   89 years Exam Location:  [facility] Procedure:      VAS [place] LOWER EXTREMITY VENOUS (DVT) Referring Phys: Fuller Plan --------------------------------------------------------------------------------  Indications: Pulmonary embolism.  Risk Factors: Confirmed PE None identified. Anticoagulation: Heparin. Limitations: Poor ultrasound/tissue interface. Comparison Study: No prior studies. Performing Technologist: Oliver Hum RVT  Examination Guidelines: A complete evaluation includes B-mode imaging, spectral Doppler, color Doppler, and power Doppler as needed of all accessible portions of each vessel. Bilateral testing is considered an integral part of a complete examination. Limited examinations for reoccurring indications may be performed as noted. The reflux portion of the exam is performed with the patient in reverse Trendelenburg.  +---------+---------------+---------+-----------+----------+--------------+ RIGHT    CompressibilityPhasicitySpontaneityPropertiesThrombus Aging +---------+---------------+---------+-----------+----------+--------------+ CFV      Partial        Yes      No                   Acute          +---------+---------------+---------+-----------+----------+--------------+ SFJ      Full                                                        +---------+---------------+---------+-----------+----------+--------------+ FV Prox  Full                                                        +---------+---------------+---------+-----------+----------+--------------+ FV Mid   Full                                                        +---------+---------------+---------+-----------+----------+--------------+  FV Distal               Yes      Yes                                 +---------+---------------+---------+-----------+----------+--------------+ PFV      Partial        Yes      No                   Acute           +---------+---------------+---------+-----------+----------+--------------+ POP      Full           Yes      No                                  +---------+---------------+---------+-----------+----------+--------------+ PTV      Full                                                        +---------+---------------+---------+-----------+----------+--------------+ PERO     Full                                                        +---------+---------------+---------+-----------+----------+--------------+ EIV                     Yes      No                                  +---------+---------------+---------+-----------+----------+--------------+ CIV                     Yes      No                                  +---------+---------------+---------+-----------+----------+--------------+   +---------+---------------+---------+-----------+----------+--------------+ LEFT     CompressibilityPhasicitySpontaneityPropertiesThrombus Aging +---------+---------------+---------+-----------+----------+--------------+ CFV      Full           Yes      No                                  +---------+---------------+---------+-----------+----------+--------------+ SFJ      Full                                                        +---------+---------------+---------+-----------+----------+--------------+ FV Prox  Full                                                        +---------+---------------+---------+-----------+----------+--------------+  FV Mid                  Yes      No                                  +---------+---------------+---------+-----------+----------+--------------+ FV DistalFull           Yes      No                                  +---------+---------------+---------+-----------+----------+--------------+ PFV      Full                                                         +---------+---------------+---------+-----------+----------+--------------+ POP      Full           Yes      No                                  +---------+---------------+---------+-----------+----------+--------------+ PTV      Full                                                        +---------+---------------+---------+-----------+----------+--------------+ PERO     Full                                                        +---------+---------------+---------+-----------+----------+--------------+     Summary: RIGHT: - Findings consistent with acute deep vein thrombosis involving the right common femoral vein, and right proximal profunda vein. - No cystic structure found in the popliteal fossa.  LEFT: - There is no evidence of deep vein thrombosis in the lower extremity.  - No cystic structure found in the popliteal fossa.  *See table(s) above for measurements and observations. Electronically signed by Servando Snare MD on 06/07/2022 at 4:37:37 PM.    Final    CT ABDOMEN PELVIS W CONTRAST  Result Date: 06/07/2022 CLINICAL DATA:  Abdominal pain. History of bladder cancer. * Tracking Code: BO * EXAM: CT ABDOMEN AND PELVIS WITH CONTRAST TECHNIQUE: Multidetector CT imaging of the abdomen and pelvis was performed using the standard protocol following bolus administration of intravenous contrast. RADIATION DOSE REDUCTION: This exam was performed according to the departmental dose-optimization program which includes automated exposure control, adjustment of the mA and/or kV according to patient size and/or use of iterative reconstruction technique. CONTRAST:  4m OMNIPAQUE IOHEXOL 350 MG/ML SOLN COMPARISON:  01/13/2022 FINDINGS: Lower chest: Please see report for chest CTA performed at the same time and dictated separately. Hepatobiliary: Interval development of innumerable hypoenhancing liver lesions ranging in size from several mm up to about 13 mm maximum size. Imaging features compatible  with metastatic disease. Gallbladder is distended with layering tiny stones or sludge towards the fundus. No intrahepatic  or extrahepatic biliary dilation. Pancreas: No focal mass lesion. No dilatation of the main duct. No intraparenchymal cyst. No peripancreatic edema. Spleen: No splenomegaly. No focal mass lesion. Adrenals/Urinary Tract: No adrenal nodule or mass. Right kidney surgically absent. Stable 2.6 cm simple cyst interpolar left kidney with left moderate hydroureteronephrosis. Left ureter is dilated down to the UVJ region. As on the prior study, there is a large heterogeneously enhancing mass lesion occupying the location of the bladder. This was measured previously at 8.9 x 8.3 cm which compares 2 8.3 x 8.3 cm today when measured in a similar fashion. Stomach/Bowel: Stomach is unremarkable. No gastric wall thickening. No evidence of outlet obstruction. Duodenum is normally positioned as is the ligament of Treitz. No small bowel wall thickening. No small bowel dilatation. The terminal ileum is normal. The appendix is not well visualized, but there is no edema or inflammation in the region of the cecum. No gross colonic mass. No colonic wall thickening. Vascular/Lymphatic: There is advanced atherosclerotic calcification of the abdominal aorta without aneurysm. There is no gastrohepatic or hepatoduodenal ligament lymphadenopathy. 10 mm short axis retrocaval lymph node on 35/3 is new in the interval. 10 mm short axis left para-aortic lymph node on 45/3 is new since prior. 15 mm short axis right external iliac node on 67/3 was 7 mm short axis previously. 16 mm short axis left external iliac node on 65/3 was 5-6 mm previously. Reproductive: There is no adnexal mass. Other: No substantial intraperitoneal free fluid. Musculoskeletal: Small umbilical hernia contains only fat. No worrisome lytic or sclerotic osseous abnormality. IMPRESSION: 1. Interval development of innumerable hypoenhancing liver lesions ranging  in size from several mm up to about 13 mm maximum size. Imaging features compatible with metastatic disease. 2. Interval progression of retroperitoneal and bilateral external iliac lymphadenopathy, consistent with metastatic disease. 3. Similar appearance of a large heterogeneously enhancing mass lesion occupying the location of the bladder. Moderate left hydroureteronephrosis with left ureter dilated down to the UVJ region, similar to prior. 4. Status post right nephrectomy. 5. Small umbilical hernia contains only fat. 6.  Aortic Atherosclerosis (ICD10-I70.0). Electronically Signed   By: Misty Stanley M.D.   On: 06/07/2022 11:16   CT Angio Chest Pulmonary Embolism (PE) W or WO Contrast  Result Date: 06/07/2022 CLINICAL DATA:  PE suspected EXAM: CT ANGIOGRAPHY CHEST WITH CONTRAST TECHNIQUE: Multidetector CT imaging of the chest was performed using the standard protocol during bolus administration of intravenous contrast. Multiplanar CT image reconstructions and MIPs were obtained to evaluate the vascular anatomy. RADIATION DOSE REDUCTION: This exam was performed according to the departmental dose-optimization program which includes automated exposure control, adjustment of the mA and/or kV according to patient size and/or use of iterative reconstruction technique. CONTRAST:  IV contrast was used to improve disease detection. COMPARISON:  CT AP 01/13/22 FINDINGS: Cardiovascular: Satisfactory opacification of the pulmonary arteries to the segmental level. There is an acute PE in a right lower lobe segmental pulmonary artery (series 6, image 146). RV/LV ratio >1. No pericardial effusion. Mediastinum/Nodes: No enlarged mediastinal, or axillary lymph nodes. There is a enlarged 1.6 cm right hilar lymph node (series 6, image 125). Thyroid gland, trachea, and esophagus demonstrate no significant findings. Lungs/Pleura: No pleural effusion. No pneumothorax. Bibasilar atelectasis. 12Multiple bilateral pulmonary nodules,  which are either new or enlarged compared to prior CT abdomen and pelvis dated 01/14/2019. Index nodules include a 4 mm nodule in the right upper lobe (series 6, image 67), a 6 mm nodule in the right  upper lobe (series 6, image 96), a 1.2 cm perifissural right lower lobe pulmonary nodule (series 6, image 99), a 1.4 cm left lower lobe pulmonary nodule (series 6, image 133), a 1.3 cm right lower lobe pulmonary nodule (series 6, image 160). Upper Abdomen: The see separately dictated CT abdomen and pelvis for additional findings Musculoskeletal: There is a 1.9 x 1.9 cm subcutaneous hypodense lesion in the right axilla (series 6, image 25). Review of the MIP images confirms the above findings. IMPRESSION: 1. Acute PE in a right lower lobe segmental pulmonary artery. RV/LV ratio is greater than 1, which is suggestive of right heart strain. 2. Multiple bilateral pulmonary nodules, which are either new or enlarged compared to prior CT abdomen and pelvis dated 01/13/2022, concerning for worsening pulmonary metastatic disease. 3. Enlarged right hilar lymph node, also concerning for metastatic disease. 4. Indeterminate 1.9 cm subcutaneous hypodense lesion in the right axilla, nonspecific. Recommend correlation with physical exam. 5. Please see same day CT AP for additional findings. Electronically Signed   By: Marin Roberts M.D.   On: 06/07/2022 11:16   DG Chest 2 View  Result Date: 06/07/2022 CLINICAL DATA:  Chest pain. EXAM: CHEST - 2 VIEW COMPARISON:  January 21, 2016 FINDINGS: The heart size and mediastinal contours are within normal limits. There is marked severity calcification of the aortic arch. Low lung volumes are noted. Mild atelectasis is seen within the bilateral lung bases. There is no evidence of focal consolidation, pleural effusion or pneumothorax. The visualized skeletal structures are unremarkable. IMPRESSION: Low lung volumes with mild bibasilar atelectasis. Electronically Signed   By: Virgina Norfolk  M.D.   On: 06/07/2022 00:59    Microbiology: Results for orders placed or performed during the hospital encounter of 08/29/21  Blood culture (routine x 2)     Status: None   Collection Time: 08/29/21  5:19 PM   Specimen: BLOOD  Result Value Ref Range Status   Specimen Description   Final    BLOOD BLOOD LEFT ARM Performed at [facility] [address] [place], [place] [address]    Special Requests   Final    BOTTLES DRAWN AEROBIC AND ANAEROBIC Blood Culture adequate volume Performed at [facility] [address], [place], [place] [address]    Culture   Final    NO GROWTH 5 DAYS Performed at [facility], [place] [address] [place], [place] [address]    Report Status 09/03/2021 FINAL  Final  Resp Panel by RT-PCR (Flu A&B, Covid) Nasopharyngeal Swab     Status: None   Collection Time: 08/29/21  5:19 PM   Specimen: Nasopharyngeal Swab; Nasopharyngeal(NP) swabs in vial transport medium  Result Value Ref Range Status   SARS Coronavirus 2 by RT PCR NEGATIVE NEGATIVE Final    Comment: (NOTE) SARS-CoV-2 target nucleic acids are NOT DETECTED.  The SARS-CoV-2 RNA is generally detectable in upper respiratory specimens during the acute phase of infection. The lowest concentration of SARS-CoV-2 viral copies this assay can detect is 138 copies/mL. A negative result does not preclude SARS-Cov-2 infection and should not be used as the sole basis for treatment or other patient management decisions. A negative result may occur with  improper specimen collection/handling, submission of specimen other than nasopharyngeal swab, presence of viral mutation(s) within the areas targeted by this assay, and inadequate number of viral copies(<138 copies/mL). A negative result must be combined with clinical observations, patient history, and epidemiological information. The expected result is  Negative.  Fact Sheet for Patients:   EntrepreneurPulse.com.au  Fact Sheet for Healthcare Providers:  IncredibleEmployment.be  This test is no t yet approved or cleared by the [place] FDA and  has been authorized for detection and/or diagnosis of SARS-CoV-2 by FDA under an Emergency Use Authorization (EUA). This EUA will remain  in effect (meaning this test can be used) for the duration of the COVID-19 declaration under Section 564(b)(1) of the Act, 21 U.S.C.section 360bbb-3(b)(1), unless the authorization is terminated  or revoked sooner.       Influenza A by PCR NEGATIVE NEGATIVE Final   Influenza B by PCR NEGATIVE NEGATIVE Final    Comment: (NOTE) The Xpert Xpress SARS-CoV-2/FLU/RSV plus assay is intended as an aid in the diagnosis of influenza from Nasopharyngeal swab specimens and should not be used as a sole basis for treatment. Nasal washings and aspirates are unacceptable for Xpert Xpress SARS-CoV-2/FLU/RSV testing.  Fact Sheet for Patients: EntrepreneurPulse.com.au  Fact Sheet for Healthcare Providers: IncredibleEmployment.be  This test is not yet approved or cleared by the [place] FDA and has been authorized for detection and/or diagnosis of SARS-CoV-2 by FDA under an Emergency Use Authorization (EUA). This EUA will remain in effect (meaning this test can be used) for the duration of the COVID-19 declaration under Section 564(b)(1) of the Act, 21 U.S.C. section 360bbb-3(b)(1), unless the authorization is terminated or revoked.  Performed at [facility], [place] [address] [place], [place] [address]   Blood culture (routine x 2)     Status: None   Collection Time: 08/29/21 10:30 PM   Specimen: BLOOD  Result Value Ref Range Status   Specimen Description BLOOD SITE NOT SPECIFIED  Final   Special Requests   Final    BOTTLES DRAWN AEROBIC AND ANAEROBIC Blood Culture adequate volume   Culture   Final     NO GROWTH 5 DAYS Performed at [facility], [address], [place], [place] [address]    Report Status 09/03/2021 FINAL  Final  Surgical pcr screen     Status: None   Collection Time: 09/02/21  5:14 AM   Specimen: Nasal Mucosa; Nasal Swab  Result Value Ref Range Status   MRSA, PCR NEGATIVE NEGATIVE Final   Staphylococcus aureus NEGATIVE NEGATIVE Final    Comment: (NOTE) The Xpert SA Assay (FDA approved for NASAL specimens in patients 37 years of age and older), is one component of a comprehensive surveillance program. It is not intended to diagnose infection nor to guide or monitor treatment. Performed at [facility], [place] [address] [place], [place] [address]     Labs: CBC: Recent Labs  Lab 06/07/22 0012 06/08/22 0535 06/09/22 0514 06/10/22 0052 06/11/22 0242  WBC 18.5* 20.6* 18.8* 18.2* 23.6*  NEUTROABS  --   --   --  14.4*  --   HGB 11.8* 10.7* 10.4* 10.5* 10.4*  HCT 38.2 33.8* 32.7* 31.8* 31.8*  MCV 90.5 89.7 87.2 86.2 85.7  PLT 426* 372 323 314 263   Basic Metabolic Panel: Recent Labs  Lab 06/07/22 0012 06/08/22 0656 06/09/22 0514 06/10/22 0052 06/11/22 0242  NA 135 132* 131* 132* 135  K 5.6* 5.3* 4.1 4.6 4.0  CL 91* 94* 91* 92* 93*  CO2 _0 GLUCOSE 138* 66* 77 104* 75  BUN 43* 57* 30* 44* 20  CREATININE 5.62* 7.74* 5.01* 6.11* 3.99*  CALCIUM 8.5* 8.2* 8.1* 8.2* 8.2*  MG  --   --  1.8 1.9 1.5*  PHOS  --  4.1  --   --   --    Liver Function Tests: Recent Labs  Lab 06/08/22 0656  ALBUMIN 1.7*   CBG: Recent Labs  Lab 06/10/22 2247 06/10/22 2250  GLUCAP 13* 88    Discharge time spent: greater than 30 minutes.  Signed: Patrecia Pour, MD [facility] 06/11/2022

## 2022-06-11 NOTE — Care Plan (Addendum)
Can stop DAPT considering she did not have a peripheral vascular disease stent in February, balloon only. Can start eliquis '5mg'$  BID only for PE. With metastatic disease and bleeding risk; would avoid multiple blood thinning agents.

## 2022-06-11 NOTE — Progress Notes (Signed)
This chaplain received the Pt. notarized Advance Directive from the notary. The chaplain understands the Pt., Pt. daughter-[name], notary, and witnesses were present for the notarizing of the Pt. Advance Directive:  HCPOA and Living Will.  The Pt. named [name] as her healthcare agent.    The chaplain gave the Pt. the original AD along with one copy. The chaplain scanned the Pt. AD into her EMR.  This chaplain is available for F/U spiritual care as needed.  Chaplain Sallyanne Kuster [phone]

## 2022-06-11 NOTE — Progress Notes (Signed)
Discharge delayed due to late HD yesterday.  Seen in room - eating breakfast - she feels well enough to leave today.  Resume outpatient HD tomorrow as planned, I have sent them her updated orders/info.  Veneta Penton, PA-C [employer] Pager [phone]

## 2022-06-11 NOTE — Progress Notes (Signed)
Pt d/c today. Spoke to [place] at [facility]. Clinic is aware of pt's d/c and that pt should resume care tomorrow.  Melven Sartorius Renal Navigator [phone]

## 2022-06-11 NOTE — Care Management Important Message (Signed)
Important Message  Patient Details  Name: Joanne Carlson MRN: [id number] Date of Birth: Jul 27, 1945   Medicare Important Message Given:  Yes     Shelda Altes 06/11/2022, 12:01 PM

## 2022-06-11 NOTE — Progress Notes (Signed)
Daily Progress Note   Patient Name: Joanne Carlson       Date: 06/11/2022 DOB: September 26, 1945  Age: 76 y.o. MRN#: [id number] Attending Physician: Patrecia Pour, MD Primary Care Physician: Vernie Shanks, MD (Inactive) Admit Date: 06/06/2022  Reason for Consultation/Follow-up: Establishing goals of care  Subjective: Medical records reviewed including progress notes, labs, imaging. Patient assessed at the bedside.  She reports feeling well today after some confusion last night that delayed discharge. Her daughter [name] is present.  Patient's daughter shares that they actually would like to complete HCPOA/living will prior to discharge this morning with assistance from a notary.  Patient agrees.  Counseled on advanced directives and provided further education, assisting with finalizing living will selections.  Coordinated with PMT chaplain and notary to come finalize signatures.  Questions and concerns addressed.  PMT will continue to support holistically.   Length of Stay: 3  Physical Exam Vitals and nursing note reviewed.  Constitutional:      General: She is not in acute distress.    Appearance: She is ill-appearing.  Cardiovascular:     Rate and Rhythm: Normal rate.  Pulmonary:     Effort: Pulmonary effort is normal.  Skin:    General: Skin is warm and dry.  Neurological:     Mental Status: She is alert and oriented to person, place, and time.  Psychiatric:        Mood and Affect: Mood normal.        Behavior: Behavior normal.           Vital Signs: BP (!) 87/40 (BP Location: Left Arm)   Pulse 76   Temp 98.4 F (36.9 C) (Oral)   Resp 14   Ht '5\' 4"'$  (1.626 m)   Wt 57.5 kg   SpO2 98%   BMI 21.76 kg/m  SpO2: SpO2: 98 % O2 Device: O2 Device: Room Air O2 Flow Rate:         Palliative Assessment/Data: 50%   Palliative Care Assessment & Plan   Patient Profile: 76 y.o. female  with past medical history of hypertension, hyperlipidemia, ESRD on HD, PVD, diabetes mellitus type 2, and metastatic bladder cancer s/p TURBT admitted on 06/06/2022 with left-sided chest pain.    In the ED, CT scan of the chest noted acute PE in the  right lower lobe along with RV/LV ratio greater than 1, suggestive of right heart strain, with multiple bilateral pulmonary nodules which appear either new and/or enlarged from prior CT, and interval development of innumerable hypoenhancing liver lesions concerning for worsening metastatic disease.  Patient's treatment options have been very limited as oncology has previously recommended hospice.  Patient was seen once by outpatient palliative care in September.   PMT has been consulted to assist with goals of care conversation.  Assessment: Goals of care conversation Metastatic bladder cancer Acute PE Acute DVT of right lower extremity End-stage renal disease on dialysis  Recommendations/Plan: Continue DNR Continue current care and HD Patient's goal is to continue enjoying her current quality of life for as long as she can.  She would be in favor of transition to hospice if she were to decline further Outpatient palliative care to follow for ongoing discussions after discharge Will attempt to coordinate completion of advanced directives with notary today prior to discharge Psychosocial and emotional support provided PMT will continue to follow and support  Prognosis: Poor long-term prognosis with metastatic cancer and no treatment options per oncology   Discharge Planning: Home with Palliative Services   Care plan was discussed with patient, patient's daughter, chaplain Sallyanne Kuster   MDM high         [place], PA-C  Palliative Medicine Team Team phone # [phone]  Thank you for allowing the Palliative  Medicine Team to assist in the care of this patient. Please utilize secure chat with additional questions, if there is no response within 30 minutes please call the above phone number.  Palliative Medicine Team providers are available by phone from 7am to 7pm daily and can be reached through the team cell phone.  Should this patient require assistance outside of these hours, please call the patient's attending physician.

## 2022-06-12 ENCOUNTER — Telehealth (HOSPITAL_COMMUNITY): Payer: Self-pay | Admitting: Nephrology

## 2022-06-12 NOTE — Telephone Encounter (Signed)
Transition of care contact from inpatient facility  Date of Discharge: 06/11/22 Date of Contact: 06/12/22 - attempted Method of contact: Phone  Attempted to contact patient to discuss transition of care from inpatient admission. Patient did not answer the phone. Message was left on the patient's voicemail with call back number [phone].   Veneta Penton, PA-C [employer] Pager [phone]

## 2022-06-28 ENCOUNTER — Other Ambulatory Visit: Payer: Self-pay

## 2022-06-28 ENCOUNTER — Emergency Department (HOSPITAL_COMMUNITY): Payer: Medicare Other

## 2022-06-28 ENCOUNTER — Encounter (HOSPITAL_COMMUNITY): Payer: Self-pay

## 2022-06-28 ENCOUNTER — Inpatient Hospital Stay (HOSPITAL_COMMUNITY)
Admission: EM | Admit: 2022-06-28 | Discharge: 2022-07-02 | DRG: 871 | Disposition: A | Discharge status: Hospice | Payer: Medicare Other | Attending: Internal Medicine | Admitting: Internal Medicine

## 2022-06-28 DIAGNOSIS — Z515 Encounter for palliative care: Secondary | ICD-10-CM | POA: Diagnosis not present

## 2022-06-28 DIAGNOSIS — J309 Allergic rhinitis, unspecified: Secondary | ICD-10-CM | POA: Diagnosis present

## 2022-06-28 DIAGNOSIS — Z66 Do not resuscitate: Secondary | ICD-10-CM | POA: Diagnosis present

## 2022-06-28 DIAGNOSIS — Z992 Dependence on renal dialysis: Secondary | ICD-10-CM | POA: Diagnosis not present

## 2022-06-28 DIAGNOSIS — R1310 Dysphagia, unspecified: Secondary | ICD-10-CM | POA: Diagnosis present

## 2022-06-28 DIAGNOSIS — Z6823 Body mass index (BMI) 23.0-23.9, adult: Secondary | ICD-10-CM

## 2022-06-28 DIAGNOSIS — G9341 Metabolic encephalopathy: Secondary | ICD-10-CM | POA: Diagnosis present

## 2022-06-28 DIAGNOSIS — M898X9 Other specified disorders of bone, unspecified site: Secondary | ICD-10-CM | POA: Diagnosis present

## 2022-06-28 DIAGNOSIS — Z7982 Long term (current) use of aspirin: Secondary | ICD-10-CM

## 2022-06-28 DIAGNOSIS — I12 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 stage renal disease: Secondary | ICD-10-CM | POA: Diagnosis present

## 2022-06-28 DIAGNOSIS — E1122 Type 2 diabetes mellitus with diabetic chronic kidney disease: Secondary | ICD-10-CM | POA: Diagnosis present

## 2022-06-28 DIAGNOSIS — R6521 Severe sepsis with septic shock: Secondary | ICD-10-CM | POA: Diagnosis present

## 2022-06-28 DIAGNOSIS — R011 Cardiac murmur, unspecified: Secondary | ICD-10-CM | POA: Diagnosis present

## 2022-06-28 DIAGNOSIS — Z8551 Personal history of malignant neoplasm of bladder: Secondary | ICD-10-CM

## 2022-06-28 DIAGNOSIS — R571 Hypovolemic shock: Secondary | ICD-10-CM | POA: Diagnosis present

## 2022-06-28 DIAGNOSIS — E11649 Type 2 diabetes mellitus with hypoglycemia without coma: Secondary | ICD-10-CM | POA: Diagnosis not present

## 2022-06-28 DIAGNOSIS — N186 End stage renal disease: Secondary | ICD-10-CM | POA: Diagnosis present

## 2022-06-28 DIAGNOSIS — Z7401 Bed confinement status: Secondary | ICD-10-CM

## 2022-06-28 DIAGNOSIS — Z789 Other specified health status: Secondary | ICD-10-CM | POA: Diagnosis not present

## 2022-06-28 DIAGNOSIS — D631 Anemia in chronic kidney disease: Secondary | ICD-10-CM | POA: Diagnosis present

## 2022-06-28 DIAGNOSIS — Z7901 Long term (current) use of anticoagulants: Secondary | ICD-10-CM

## 2022-06-28 DIAGNOSIS — E43 Unspecified severe protein-calorie malnutrition: Secondary | ICD-10-CM | POA: Diagnosis present

## 2022-06-28 DIAGNOSIS — A419 Sepsis, unspecified organism: Secondary | ICD-10-CM | POA: Diagnosis present

## 2022-06-28 DIAGNOSIS — Z1152 Encounter for screening for COVID-19: Secondary | ICD-10-CM

## 2022-06-28 DIAGNOSIS — Z87891 Personal history of nicotine dependence: Secondary | ICD-10-CM

## 2022-06-28 DIAGNOSIS — E785 Hyperlipidemia, unspecified: Secondary | ICD-10-CM | POA: Diagnosis present

## 2022-06-28 DIAGNOSIS — E7800 Pure hypercholesterolemia, unspecified: Secondary | ICD-10-CM | POA: Diagnosis present

## 2022-06-28 DIAGNOSIS — Z7189 Other specified counseling: Secondary | ICD-10-CM | POA: Diagnosis not present

## 2022-06-28 DIAGNOSIS — C679 Malignant neoplasm of bladder, unspecified: Secondary | ICD-10-CM | POA: Diagnosis present

## 2022-06-28 DIAGNOSIS — E1151 Type 2 diabetes mellitus with diabetic peripheral angiopathy without gangrene: Secondary | ICD-10-CM | POA: Diagnosis present

## 2022-06-28 DIAGNOSIS — M109 Gout, unspecified: Secondary | ICD-10-CM | POA: Diagnosis present

## 2022-06-28 DIAGNOSIS — Z8673 Personal history of transient ischemic attack (TIA), and cerebral infarction without residual deficits: Secondary | ICD-10-CM

## 2022-06-28 DIAGNOSIS — R627 Adult failure to thrive: Secondary | ICD-10-CM | POA: Diagnosis present

## 2022-06-28 DIAGNOSIS — Z9049 Acquired absence of other specified parts of digestive tract: Secondary | ICD-10-CM

## 2022-06-28 DIAGNOSIS — Z888 Allergy status to other drugs, medicaments and biological substances status: Secondary | ICD-10-CM

## 2022-06-28 DIAGNOSIS — Z86718 Personal history of other venous thrombosis and embolism: Secondary | ICD-10-CM

## 2022-06-28 DIAGNOSIS — Z9851 Tubal ligation status: Secondary | ICD-10-CM

## 2022-06-28 DIAGNOSIS — Z79899 Other long term (current) drug therapy: Secondary | ICD-10-CM

## 2022-06-28 DIAGNOSIS — Z86711 Personal history of pulmonary embolism: Secondary | ICD-10-CM

## 2022-06-28 DIAGNOSIS — Z8719 Personal history of other diseases of the digestive system: Secondary | ICD-10-CM

## 2022-06-28 DIAGNOSIS — R579 Shock, unspecified: Secondary | ICD-10-CM

## 2022-06-28 DIAGNOSIS — Z89412 Acquired absence of left great toe: Secondary | ICD-10-CM

## 2022-06-28 DIAGNOSIS — Z8249 Family history of ischemic heart disease and other diseases of the circulatory system: Secondary | ICD-10-CM

## 2022-06-28 LAB — COMPREHENSIVE METABOLIC PANEL
ALT: 63 U/L — ABNORMAL HIGH (ref 0–44)
AST: 236 U/L — ABNORMAL HIGH (ref 15–41)
Albumin: <1.5 g/dL — ABNORMAL LOW (ref 3.5–5.0)
Alkaline Phosphatase: 247 U/L — ABNORMAL HIGH (ref 38–126)
Anion gap: 12 (ref 5–15)
BUN: 34 mg/dL — ABNORMAL HIGH (ref 8–23)
CO2: 27 mmol/L (ref 22–32)
Calcium: 7.9 mg/dL — ABNORMAL LOW (ref 8.9–10.3)
Chloride: 97 mmol/L — ABNORMAL LOW (ref 98–111)
Creatinine, Ser: 6.35 mg/dL — ABNORMAL HIGH (ref 0.44–1.00)
GFR, Estimated: 6 mL/min — ABNORMAL LOW (ref >60)
Glucose, Bld: 56 mg/dL — ABNORMAL LOW (ref 70–99)
Potassium: 4.9 mmol/L (ref 3.5–5.1)
Sodium: 136 mmol/L (ref 135–145)
Total Bilirubin: 1.3 mg/dL — ABNORMAL HIGH (ref 0.3–1.2)
Total Protein: 6.3 g/dL — ABNORMAL LOW (ref 6.5–8.1)

## 2022-06-28 LAB — CBC WITH DIFFERENTIAL/PLATELET
Abs Immature Granulocytes: 0.8 10*3/uL — ABNORMAL HIGH (ref 0.00–0.07)
Band Neutrophils: 3 %
Basophils Absolute: 0 10*3/uL (ref 0.0–0.1)
Basophils Relative: 0 %
Eosinophils Absolute: 0 10*3/uL (ref 0.0–0.5)
Eosinophils Relative: 0 %
HCT: 32.6 % — ABNORMAL LOW (ref 36.0–46.0)
Hemoglobin: 10.8 g/dL — ABNORMAL LOW (ref 12.0–15.0)
Lymphocytes Relative: 9 %
Lymphs Abs: 2.4 10*3/uL (ref 0.7–4.0)
MCH: 27.8 pg (ref 26.0–34.0)
MCHC: 33.1 g/dL (ref 30.0–36.0)
MCV: 83.8 fL (ref 80.0–100.0)
Metamyelocytes Relative: 1 %
Monocytes Absolute: 1.6 10*3/uL — ABNORMAL HIGH (ref 0.1–1.0)
Monocytes Relative: 6 %
Myelocytes: 2 %
Neutro Abs: 22.1 10*3/uL — ABNORMAL HIGH (ref 1.7–7.7)
Neutrophils Relative %: 79 %
Platelets: 324 10*3/uL (ref 150–400)
RBC: 3.89 MIL/uL (ref 3.87–5.11)
RDW: 20.6 % — ABNORMAL HIGH (ref 11.5–15.5)
WBC Morphology: INCREASED
WBC: 27 10*3/uL — ABNORMAL HIGH (ref 4.0–10.5)
nRBC: 0 % (ref 0.0–0.2)

## 2022-06-28 LAB — RESPIRATORY PANEL BY PCR

## 2022-06-28 LAB — CBG MONITORING, ED
Glucose-Capillary: 63 mg/dL — ABNORMAL LOW (ref 70–99)
Glucose-Capillary: 51 mg/dL — ABNORMAL LOW (ref 70–99)
Glucose-Capillary: 139 mg/dL — ABNORMAL HIGH (ref 70–99)
Glucose-Capillary: 134 mg/dL — ABNORMAL HIGH (ref 70–99)

## 2022-06-28 LAB — LIPASE, BLOOD: Lipase: 26 U/L (ref 11–51)

## 2022-06-28 LAB — MRSA NEXT GEN BY PCR, NASAL: MRSA by PCR Next Gen: NOT DETECTED

## 2022-06-28 LAB — LACTIC ACID, PLASMA
Lactic Acid, Venous: 3 mmol/L (ref 0.5–1.9)
Lactic Acid, Venous: 3.3 mmol/L (ref 0.5–1.9)

## 2022-06-28 LAB — GLUCOSE, CAPILLARY
Glucose-Capillary: 128 mg/dL — ABNORMAL HIGH (ref 70–99)
Glucose-Capillary: 136 mg/dL — ABNORMAL HIGH (ref 70–99)

## 2022-06-28 LAB — CORTISOL: Cortisol, Plasma: 29.3 ug/dL

## 2022-06-28 LAB — RESP PANEL BY RT-PCR (RSV, FLU A&B, COVID)  RVPGX2
Influenza A by PCR: NEGATIVE
Influenza B by PCR: NEGATIVE
Resp Syncytial Virus by PCR: NEGATIVE
SARS Coronavirus 2 by RT PCR: NEGATIVE

## 2022-06-28 LAB — AMMONIA: Ammonia: 37 umol/L — ABNORMAL HIGH (ref 9–35)

## 2022-06-28 LAB — PHOSPHORUS: Phosphorus: 4.1 mg/dL (ref 2.5–4.6)

## 2022-06-28 LAB — MAGNESIUM: Magnesium: 2.1 mg/dL (ref 1.7–2.4)

## 2022-06-28 MED ORDER — SODIUM CHLORIDE 0.9 % IV BOLUS
500.0000 mL | Freq: Once | INTRAVENOUS | Status: AC
  Administered 2022-06-28: 500 mL via INTRAVENOUS

## 2022-06-28 MED ORDER — DEXTROSE 10 % IV BOLUS
250.0000 mL | Freq: Once | INTRAVENOUS | Status: AC
  Administered 2022-06-28: 250 mL via INTRAVENOUS

## 2022-06-28 MED ORDER — ORAL CARE MOUTH RINSE: 15.0000 mL | OROMUCOSAL | Status: DC | PRN

## 2022-06-28 MED ORDER — SODIUM CHLORIDE 0.9 % IV SOLN
2.0000 g | Freq: Once | INTRAVENOUS | Status: AC
  Administered 2022-06-28: 2 g via INTRAVENOUS
  Filled 2022-06-28: qty 12.5

## 2022-06-28 MED ORDER — ALBUMIN HUMAN 25 % IV SOLN
25.0000 g | Freq: Four times a day (QID) | INTRAVENOUS | Status: AC
  Administered 2022-06-28 – 2022-06-29 (×2): 25 g via INTRAVENOUS
  Filled 2022-06-28 (×4): qty 100

## 2022-06-28 MED ORDER — NOREPINEPHRINE 4 MG/250ML-% IV SOLN
2.0000 ug/min | INTRAVENOUS | Status: DC
  Administered 2022-06-29: 7 ug/min via INTRAVENOUS
  Administered 2022-06-29 (×2): 6 ug/min via INTRAVENOUS
  Administered 2022-06-30 (×2): 7 ug/min via INTRAVENOUS
  Administered 2022-07-01: 8 ug/min via INTRAVENOUS
  Administered 2022-07-01: 10 ug/min via INTRAVENOUS
  Filled 2022-06-28 (×2): qty 250
  Filled 2022-06-28: qty 500
  Filled 2022-06-28 (×4): qty 250

## 2022-06-28 MED ORDER — ACETAMINOPHEN 160 MG/5ML PO SOLN
650.0000 mg | Freq: Once | ORAL | Status: DC
  Filled 2022-06-28: qty 20.3

## 2022-06-28 MED ORDER — NOREPINEPHRINE 4 MG/250ML-% IV SOLN
0.0000 ug/min | INTRAVENOUS | Status: DC
  Administered 2022-06-28: 2 ug/min via INTRAVENOUS
  Filled 2022-06-28: qty 250

## 2022-06-28 MED ORDER — DEXTROSE 50 % IV SOLN
INTRAVENOUS | Status: AC
  Administered 2022-06-28: 50 mL via INTRAVENOUS
  Filled 2022-06-28: qty 50

## 2022-06-28 MED ORDER — SODIUM CHLORIDE 0.9 % IV SOLN
250.0000 mL | INTRAVENOUS | Status: DC
  Administered 2022-06-29: 250 mL via INTRAVENOUS

## 2022-06-28 MED ORDER — DOCUSATE SODIUM 100 MG PO CAPS: 100.0000 mg | ORAL_CAPSULE | Freq: Two times a day (BID) | ORAL | Status: DC | PRN

## 2022-06-28 MED ORDER — DEXTROSE 50 % IV SOLN: 1.0000 | Freq: Once | INTRAVENOUS | Status: AC

## 2022-06-28 MED ORDER — MIDODRINE HCL 5 MG PO TABS
5.0000 mg | ORAL_TABLET | Freq: Once | ORAL | Status: AC
  Administered 2022-06-28: 5 mg via ORAL
  Filled 2022-06-28: qty 1

## 2022-06-28 MED ORDER — CHLORHEXIDINE GLUCONATE CLOTH 2 % EX PADS
6.0000 | MEDICATED_PAD | Freq: Every day | CUTANEOUS | Status: DC
  Administered 2022-06-28: 6 via TOPICAL

## 2022-06-28 MED ORDER — POLYETHYLENE GLYCOL 3350 17 G PO PACK: 17.0000 g | PACK | Freq: Every day | ORAL | Status: DC | PRN

## 2022-06-28 MED ORDER — SODIUM CHLORIDE 0.9 % IV SOLN
500.0000 mg | INTRAVENOUS | Status: DC
  Administered 2022-06-28 – 2022-06-30 (×3): 500 mg via INTRAVENOUS
  Filled 2022-06-28 (×4): qty 5

## 2022-06-28 MED ORDER — LACTATED RINGERS IV BOLUS
1000.0000 mL | Freq: Once | INTRAVENOUS | Status: AC
  Administered 2022-06-28: 1000 mL via INTRAVENOUS

## 2022-06-28 MED ORDER — VANCOMYCIN HCL 1500 MG/300ML IV SOLN
1500.0000 mg | Freq: Once | INTRAVENOUS | Status: AC
  Administered 2022-06-28: 1500 mg via INTRAVENOUS
  Filled 2022-06-28: qty 300

## 2022-06-28 NOTE — Progress Notes (Signed)
An USGPIV (ultrasound guided PIV) has been placed for short-term vasopressor infusion. A correctly placed ivWatch must be used when administering Vasopressors. Should this treatment be needed beyond 72 hours, central line access should be obtained.  It will be the responsibility of the bedside nurse to follow best practice to prevent extravasations.   ?

## 2022-06-28 NOTE — ED Notes (Signed)
Only able to get one set of blood cultures due to hard stick, MD made aware. Antibiotics started

## 2022-06-28 NOTE — Progress Notes (Signed)
[facility] Progress Note Patient Name: MARGARITE VESSEL DOB: [birth year] MRN: [id number]   Date of Service  06/28/2022  HPI/Events of Note  Patient just arrived to ICU. CCM note reviewed. HD patient. On 12 mic/min levophed with SBP 116, map 67. Currently starting albumin so hoping levophed needs will reduce. On antibiotics. No distress on camera, on 4 liter o2.   eICU Interventions  Agree with albumin, antibiotics  Wean levophed . Ok to keep SBP > 90.  Overall chronically ill. DNR. Call E link if needed. If pressor needs increase then will need CVC     Intervention Category Major Interventions: Shock - evaluation and management Evaluation Type: New Patient Evaluation  Margaretmary Lombard 06/28/2022, 8:17 PM  Addendum at 1 am: Notified LA is 3.3 . Currently on 6 mic/min levophd, down from 12 mic/min. ESRD status. Getting albumin Q6h. Will recheck in AM - 5 am order placed

## 2022-06-28 NOTE — ED Provider Notes (Signed)
[facility] EMERGENCY DEPARTMENT Provider Note   CSN: [id number] Arrival date & time: 06/28/22  1053     History No chief complaint on file.   HPI Joanne Carlson is a 76 y.o. female presenting for altered mental status.  She went to a full session of dialysis on Friday and started have some confusion in the night on Friday night.  Yesterday and Saturday she was completely bedbound and today she is not responding to verbal stimuli. Extensive medical history.  No known fevers.  No known sick contacts.  Patient's daughter at bedside provides further collateral history..   Patient's recorded medical, surgical, social, medication list and allergies were reviewed in the Snapshot window as part of the initial history.   Review of Systems   Review of Systems  Constitutional:  Negative for chills and fever.  HENT:  Negative for ear pain and sore throat.   Eyes:  Negative for pain and visual disturbance.  Respiratory:  Negative for cough and shortness of breath.   Cardiovascular:  Negative for chest pain and palpitations.  Gastrointestinal:  Negative for abdominal pain and vomiting.  Genitourinary:  Negative for dysuria and hematuria.  Musculoskeletal:  Negative for arthralgias and back pain.  Skin:  Negative for color change and rash.  Neurological:  Negative for seizures and syncope.  Psychiatric/Behavioral:  Positive for agitation and confusion.   All other systems reviewed and are negative.   Physical Exam Updated Vital Signs There were no vitals taken for this visit. Physical Exam Vitals and nursing note reviewed.  Constitutional:      General: She is not in acute distress.    Appearance: She is well-developed.  HENT:     Head: Normocephalic and atraumatic.  Eyes:     Conjunctiva/sclera: Conjunctivae normal.  Cardiovascular:     Rate and Rhythm: Regular rhythm. Tachycardia present.     Heart sounds: No murmur heard. Pulmonary:     Effort: Pulmonary  effort is normal. No respiratory distress.     Breath sounds: Normal breath sounds.  Abdominal:     General: There is no distension.     Palpations: Abdomen is soft.     Tenderness: There is no abdominal tenderness. There is no right CVA tenderness or left CVA tenderness.  Musculoskeletal:        General: No swelling or tenderness. Normal range of motion.     Cervical back: Neck supple.  Skin:    General: Skin is warm and dry.  Neurological:     General: No focal deficit present.     Mental Status: She is alert and oriented to person, place, and time. Mental status is at baseline.     Cranial Nerves: No cranial nerve deficit.      ED Course/ Medical Decision Making/ A&P    Procedures .Critical Care  Performed by: Tretha Sciara, MD Authorized by: Tretha Sciara, MD   Critical care provider statement:    Critical care time (minutes):  30   Critical care was necessary to treat or prevent imminent or life-threatening deterioration of the following conditions:  Shock and sepsis   Critical care was time spent personally by me on the following activities:  Development of treatment plan with patient or surrogate, discussions with consultants, evaluation of patient's response to treatment, examination of patient, ordering and review of laboratory studies, ordering and review of radiographic studies, ordering and performing treatments and interventions, pulse oximetry, re-evaluation of patient's condition and review of old charts  Medications Ordered in ED Medications - No data to display  Medical Decision Making:    Joanne Carlson is a 76 y.o. female who presented to the ED today with chief complaint of AMS detailed above.     Handoff received from EMS.  Additional history discussed with patient's family/caregivers.  Patient's presentation is complicated by their history of multiple comorbid medical conditions including advanced age and ESRD.  Patient placed on continuous  vitals and telemetry monitoring while in ED which was reviewed periodically.   Complete initial physical exam performed, notably the patient  was tachycardic, hypotensive and warm to the touch raising concern for septic shock.  Patient was initially resuscitated with IV fluid bolus per EMS, additional IV fluid bolus started in the emergency department.   EMS blood sugar 80.   Reviewed and confirmed nursing documentation for past medical history, family history, social history.    Initial Assessment:   With the patient's presentation of altered mental status and low blood pressure, most likely diagnosis is infectious etiology including urinary tract infection, viral infection, pulmonary infection. Other diagnoses were considered including (but not limited to) meningitis, CVA, intracranial hemorrhage, metabolic disruption,. These are considered less likely due to history of present illness and physical exam findings.   This is most consistent with an acute life/limb threatening illness complicated by underlying chronic conditions.  Initial Plan:  CT head to evaluate for structural intracranial etiology for patient's altered mental status Screening labs including CBC and Metabolic panel to evaluate for infectious or metabolic etiology of disease.  Urinalysis with reflex culture ordered to evaluate for UTI or relevant urologic/nephrologic pathology.  CXR to evaluate for structural/infectious intrathoracic pathology.  EKG to evaluate for cardiac pathology. Objective evaluation as below reviewed with plan for close reassessment  Initial Study Results:   Laboratory  All laboratory results reviewed without evidence of clinically relevant pathology.   ***Exceptions include: ***   ***EKG EKG was reviewed independently. Rate, rhythm, axis, intervals all examined and without medically relevant abnormality. ST segments without concerns for elevations.    Radiology  All images reviewed independently.  ***Agree with radiology report at this time.   No results found.   Consults:  Case discussed with ***.   Final Assessment and Plan:   ***   ***  Clinical Impression: No diagnosis found.   Data Unavailable   Final Clinical Impression(s) / ED Diagnoses Final diagnoses:  None    Rx / DC Orders ED Discharge Orders     None

## 2022-06-28 NOTE — ED Triage Notes (Signed)
Pt bib [facility] from home where she lives with her daughter. Pt family complains that pt had dialysis on Saturday and was able to get around normal and acted baseline but Sunday and worsening today pt is altered.Pt found to be hypotensive 80/30 with EMS and 514m NS given en route. Pt arrives alert but unable to answer any questions appropriately.

## 2022-06-28 NOTE — ED Notes (Signed)
20 Panel Respiratory PCR added on to specimen in lab.

## 2022-06-28 NOTE — H&P (Signed)
NAME:  Joanne Carlson, MRN:  [id number], DOB:  04/19/1946, LOS: 0 ADMISSION DATE:  06/28/2022, CONSULTATION DATE:  06/28/22 REFERRING MD:  Dr. Oswald Hillock CHIEF COMPLAINT:  Hypotension   History of Present Illness:  Joanne Carlson is a 76 year old woman with history of hypertension, HLD, ESRD on HD T/Th/S, PVD, DMII and metastatic bladder cancer who was recently admitted to the hospital 12/5 to 12/8 for chest pain found to have RLL segmental pulmonary emboli and RLE DVT discharged on eliquis. Since being home she has been doing ok until today where she was noted to have altered mentation per the family. She has not been eating or drinking much since being home due to decreased appetite. She is on midodrine 7m TID for low blood pressure.    She was brought in by EMS with initial BP 80/30. She was given 5033men route. She was unable to answer questions appropriately on arrival to ER. She was given another liter of fluid with MAPs in the low to mid 50s. She was started on periphral levophed. She is febrile to 102.4F. Blood cultures were drawn and she was given cefepime and vanc.    WBC count is 27k which has been elevated since 07/2021. Alk phos 247, Albumin <1.5, AST 236, ALT 63, T bili 1.3. Glucose 56 on arrival.    At time of interview patient was more alert and responsive. Daughter and son were at bedside. They agreed to albumin infusion but do not want blood transfusions. DNR remains in place as per prior palliative care notes.   Pertinent  Medical History  Metastatic Bladder Cancer ESRD on HD DMII  Significant Hospital Events: Including procedures, antibiotic start and stop dates in addition to other pertinent events   12/25 - AMS, hypotension, fever , peripheral levophed  Interim History / Subjective:  As above  Objective   Blood pressure (!) 107/45, pulse 89, temperature 98.8 F (37.1 C), temperature source Rectal, resp. rate 15, SpO2 100 %.        Intake/Output Summary (Last  24 hours) at 06/28/2022 1855 Last data filed at 06/28/2022 [id number] per 24 hour  Intake 750 ml  Output --  Net 750 ml   There were no vitals filed for this visit.  Examination: General: chronically ill appearing woman, no acute distress, laying in bed HENT: [place]/AT, dry mucous membranes, sclera anicteric Lungs: clear to auscultation, no wheezes Cardiovascular: systolic murmur, rrr Abdomen: soft, non-distended, non-tender Extremities: warm, no edema Neuro: alert to person and place but not time, moving all extremities GU: n/a  Resolved Hospital Problem list     Assessment & Plan:  Concern for Spesis with unknown source - f/u cultures - started on cefepime, vanc and azithromycin - RSV/covid/flu negative - check extended resp viral panel   Hypovolemic Shock - received 17509mf fluid - start albumin 25g q6hrs x 3 doses - check random cortisol level - give dose of midodrine 5mg66mcontinue peripheral levo for MAP of 65   Altered Mental Status - in setting of hypoglycemia vs sepsis vs hypotension. Had episodes of delirium during prior admission - check ammonia level   Elevated LFTs and Alk Phos - CT abdomen ordered by ER - Concern for cancer spread to liver and biliary tract   ESRD on HD - consult nephrology tomorrow for HD needs   Metastatic Bladder Cancer  - notable step wise decline in her clinical status - Patient went home with palliative care after last admission  with plan to transition to hospice if quality of life declined.  - No further treatment per oncology/urology teams - Recent CT chest concerning for metastatic spread in the lymph nodes and lungs   PE/DVT - continue eliquis   Protein calorie malnutrition - in setting of malignancy  Best Practice (right click and "Reselect all SmartList Selections" daily)   Diet/type: NPO w/ oral meds DVT prophylaxis: DOAC GI prophylaxis: N/A Lines: N/A Foley:  N/A Code Status:  DNR Last date of  multidisciplinary goals of care discussion [discussed with family at bedside]  Labs   CBC: Recent Labs  Lab 06/28/22 1125  WBC 27.0*  NEUTROABS 22.1*  HGB 10.8*  HCT 32.6*  MCV 83.8  PLT 505    Basic Metabolic Panel: Recent Labs  Lab 06/28/22 1125  NA 136  K 4.9  CL 97*  CO2 27  GLUCOSE 56*  BUN 34*  CREATININE 6.35*  CALCIUM 7.9*   GFR: CrCl cannot be calculated (Unknown ideal weight.). Recent Labs  Lab 06/28/22 1125 06/28/22 1645  WBC 27.0*  --   LATICACIDVEN  --  3.0*    Liver Function Tests: Recent Labs  Lab 06/28/22 1125  AST 236*  ALT 63*  ALKPHOS 247*  BILITOT 1.3*  PROT 6.3*  ALBUMIN <1.5*   Recent Labs  Lab 06/28/22 1125  LIPASE 26   Recent Labs  Lab 06/28/22 1645  AMMONIA 37*    ABG    Component Value Date/Time   TCO2 31 11/08/2016 1131     Coagulation Profile: No results for input(s): "INR", "PROTIME" in the last 168 hours.  Cardiac Enzymes: No results for input(s): "CKTOTAL", "CKMB", "CKMBINDEX", "TROPONINI" in the last 168 hours.  HbA1C: Hgb A1c MFr Bld  Date/Time Value Ref Range Status  09/30/2021 09:50 AM 5.0 4.8 - 5.6 % Final    Comment:    (NOTE) Pre diabetes:          5.7%-6.4%  Diabetes:              >6.4%  Glycemic control for   <7.0% adults with diabetes   07/15/2021 08:52 AM 5.7 (H) 4.8 - 5.6 % Final    Comment:    (NOTE) Pre diabetes:          5.7%-6.4%  Diabetes:              >6.4%  Glycemic control for   <7.0% adults with diabetes     CBG: Recent Labs  Lab 06/28/22 1110 06/28/22 1318 06/28/22 1426 06/28/22 1545  GLUCAP 63* 51* 139* 134*    Review of Systems:   Review of Systems  Constitutional:  Positive for malaise/fatigue and weight loss. Negative for chills and fever.  HENT:  Negative for congestion, sinus pain and sore throat.   Eyes: Negative.   Respiratory:  Negative for cough, hemoptysis, sputum production, shortness of breath and wheezing.   Cardiovascular:  Negative for  chest pain, palpitations, orthopnea, claudication and leg swelling.  Gastrointestinal:  Negative for abdominal pain, heartburn, nausea and vomiting.  Genitourinary:  Positive for hematuria.  Musculoskeletal:  Negative for joint pain and myalgias.  Skin:  Negative for rash.  Neurological:  Positive for weakness.  Endo/Heme/Allergies: Negative.   Psychiatric/Behavioral: Negative.    Past Medical History:  She,  has a past medical history of Arthritis, Diabetic foot infection ([place]) (10/02/2021), Dizziness, ESRD (end stage renal disease) ([place]), History of colon polyps, History of gout, Hyperlipidemia, Hypertension, Joint pain, PAD (peripheral artery disease) ([place]),  Peripheral neuropathy, Peripheral vascular disease ([place]), Pneumonia, Pre-diabetes, Refusal of blood transfusions as patient is Jehovah's Witness, and Stroke ([place]) (1990s).   Surgical History:   Past Surgical History:  Procedure Laterality Date   ABDOMINAL AORTOGRAM W/LOWER EXTREMITY N/A 01/30/2018   Procedure: ABDOMINAL AORTOGRAM W/LOWER EXTREMITY;  Surgeon: Lorretta Harp, MD;  Location: [facility];  Service: Cardiovascular;  Laterality: N/A;   ABDOMINAL AORTOGRAM W/LOWER EXTREMITY N/A 03/03/2020   Procedure: ABDOMINAL AORTOGRAM W/LOWER EXTREMITY;  Surgeon: Lorretta Harp, MD;  Location: [facility];  Service: Cardiovascular;  Laterality: N/A;   ABDOMINAL AORTOGRAM W/LOWER EXTREMITY N/A 08/31/2021   Procedure: ABDOMINAL AORTOGRAM W/LOWER EXTREMITY;  Surgeon: Lorretta Harp, MD;  Location: [facility];  Service: Cardiovascular;  Laterality: N/A;   AMPUTATION Left 09/02/2021   Procedure: AMPUTATION GREAT TOE;  Surgeon: Felipa Furnace, DPM;  Location: [facility];  Service: Podiatry;  Laterality: Left;   APPENDECTOMY     BASCILIC VEIN TRANSPOSITION Right 10/03/2013   Procedure: BRACHIOCEPHALIC Arteriovenous Fistula ;  Surgeon: Mal Misty, MD;  Location: [facility];  Service: Vascular;  Laterality: Right;   [place] Right 11/17/2016   Procedure: BASCILIC VEIN TRANSPOSITION- RIGHT SINGLE STAGE;  Surgeon: Conrad Pecos, MD;  Location: [facility];  Service: Vascular;  Laterality: Right;   COLONOSCOPY     CYST EXCISION  1980's   cyst removed from lower abdomen   DILATION AND CURETTAGE OF UTERUS  1987   ESOPHAGOGASTRODUODENOSCOPY     PERIPHERAL VASCULAR BALLOON ANGIOPLASTY Right 01/30/2018   Procedure: PERIPHERAL VASCULAR BALLOON ANGIOPLASTY;  Surgeon: Lorretta Harp, MD;  Location: [facility];  Service: Cardiovascular;  Laterality: Right;  superficial femoral   PERIPHERAL VASCULAR BALLOON ANGIOPLASTY Left 08/31/2021   Procedure: PERIPHERAL VASCULAR BALLOON ANGIOPLASTY;  Surgeon: Lorretta Harp, MD;  Location: [facility];  Service: Cardiovascular;  Laterality: Left;   PERIPHERAL VASCULAR INTERVENTION  03/03/2020   Procedure: PERIPHERAL VASCULAR INTERVENTION;  Surgeon: Lorretta Harp, MD;  Location: [facility];  Service: Cardiovascular;;  external iliac stent left sfa lithotripsy/ drug coated balloon   SHOULDER ARTHROSCOPY W/ ROTATOR CUFF REPAIR Right 2013   TRANSURETHRAL RESECTION OF BLADDER TUMOR N/A 07/22/2021   Procedure: TRANSURETHRAL RESECTION OF BLADDER TUMOR (TURBT);  Surgeon: Vira Agar, MD;  Location: [facility];  Service: Urology;  Laterality: N/A;   TUBAL LIGATION  1986   UPPER EXTREMITY VENOGRAPHY Bilateral 11/08/2016   Procedure: Bilateral Upper Extremity Venography;  Surgeon: Conrad Millerton, MD;  Location: [facility];  Service: Cardiovascular;  Laterality: Bilateral;     Social History:   reports that she quit smoking about 54 years ago. Her smoking use included cigarettes. She has a 2.50 pack-year smoking history. She has never used smokeless tobacco. She reports that she does not currently use alcohol. She reports that she does not currently use drugs after having used the following drugs: Marijuana.   Family History:  Her family history  includes Depression in her father, mother, and sister; Hypertension in her mother and sister.   Allergies Allergies  Allergen Reactions   Blood-Group Specific Substance    Tylenol [Acetaminophen] Rash     Home Medications  Prior to Admission medications   Medication Sig Start Date End Date Taking? Authorizing Provider  allopurinol (ZYLOPRIM) 100 MG tablet Take 100 mg by mouth daily. 05/25/13   [provider]  apixaban (ELIQUIS) 5 MG TABS tablet Take 2 tablets (  10 mg total) by mouth 2 (two) times daily for 4 days, THEN 1 tablet (5 mg total) 2 (two) times daily for 26 days. 06/10/22 07/10/22  Patrecia Pour, MD  aspirin EC 81 MG tablet Take 81 mg by mouth 4 (four) times a week.    [provider]  atorvastatin (LIPITOR) 80 MG tablet TAKE 1 TABLET BY MOUTH ONCE DAILY AT  6PM Patient taking differently: Take 80 mg by mouth every evening. 12/25/21   Lorretta Harp, MD  cinacalcet (SENSIPAR) 60 MG tablet Take 60 mg by mouth every Monday, Wednesday, and Friday.    [provider]  gabapentin (NEURONTIN) 100 MG capsule Take 1 capsule by mouth every morning and 1 capsule at bedtime. PATIENT NEEDS OFFICE VISIT FOR ADDITIONAL REFILLS Patient taking differently: Take 100 mg by mouth 2 (two) times daily as needed (pain). 05/25/13   Collene Leyden, PA-C  lidocaine-prilocaine (EMLA) cream Apply 1 application topically Every Tuesday,Thursday,and Saturday with dialysis.  01/12/18   [provider]  midodrine (PROAMATINE) 5 MG tablet Take 1 tablet (5 mg total) by mouth 3 (three) times daily with meals. 06/10/22   Patrecia Pour, MD  multivitamin (RENA-VIT) TABS tablet Take 1 tablet by mouth daily.    [provider]  oxybutynin (DITROPAN) 5 MG tablet Take 1 tablet (5 mg total) by mouth 3 (three) times daily as needed for bladder spasms. 07/22/21   Vira Agar, MD  oxyCODONE (ROXICODONE) 5 MG immediate release tablet Take 1 tablet (5 mg total) by mouth every 8  (eight) hours as needed. Patient taking differently: Take 5 mg by mouth every 8 (eight) hours as needed for moderate pain or severe pain. 09/04/21 09/04/22  Eugenie Filler, MD  VELPHORO 500 MG chewable tablet Chew 500 mg by mouth 3 (three) times daily with meals. 11/05/21   [provider]     Critical care time: [place], MD [place] Office: [phone]   See Amion for personal pager PCCM on call pager [phone] until 7pm. Please call Elink 7p-7a. [phone]

## 2022-06-28 NOTE — Progress Notes (Signed)
Securechat with Larkin Ina, RN regarding the autogenerated IV team consult for USGPIV.  Patient has USGPIV that was placed by IV team earlier in the evening.  Per Larkin Ina, RN this site is site in good working order.  Request canceled at this time.

## 2022-06-28 NOTE — Consult Note (Addendum)
NAME:  Joanne Carlson, MRN:  [id number], DOB:  September 15, 1945, LOS: 0 ADMISSION DATE:  06/28/2022, CONSULTATION DATE:  06/28/22 REFERRING MD:  Dr. Oswald Hillock CHIEF COMPLAINT:  Hypotension   History of Present Illness:  Joanne Carlson is a 76 year old woman with history of hypertension, HLD, ESRD on HD T/Th/S, PVD, DMII and metastatic bladder cancer who was recently admitted to the hospital 12/5 to 12/8 for chest pain found to have RLL segmental pulmonary emboli and RLE DVT discharged on eliquis. Since being home she has been doing ok until today where she was noted to have altered mentation per the family. She has not been eating or drinking much since being home due to decreased appetite. She is on midodrine 16m TID for low blood pressure.   She was brought in by EMS with initial BP 80/30. She was given 5072men route. She was unable to answer questions appropriately on arrival to ER. She was given another liter of fluid with MAPs in the low to mid 50s. She was started on periphral levophed. She is febrile to 102.20F. Blood cultures were drawn and she was given cefepime and vanc.   WBC count is 27k which has been elevated since 07/2021. Alk phos 247, Albumin <1.5, AST 236, ALT 63, T bili 1.3. Glucose 56 on arrival.   At time of interview patient was more alert and responsive. Daughter and son were at bedside. They agreed to albumin infusion but do not want blood transfusions. DNR remains in place as per prior palliative care notes.   Pertinent  Medical History   Metastatic Bladder Cancer ESRD on HD DMII  Significant Hospital Events: Including procedures, antibiotic start and stop dates in addition to other pertinent events   12/25 - AMS, hypotension, fever  Interim History / Subjective:  As above  Objective   Blood pressure (!) 91/54, pulse 83, temperature (!) 102.7 F (39.3 C), temperature source Rectal, resp. rate 20, SpO2 98 %.       No intake or output data in the 24 hours ending  06/28/22 1533 There were no vitals filed for this visit.  Examination: General: chronically ill appearing woman, no acute distress, laying in bed HENT: [place]/AT, dry mucous membranes, sclera anicteric Lungs: clear to auscultation, no wheezes Cardiovascular: systolic murmur, rrr Abdomen: soft, non-distended, non-tender Extremities: warm, no edema Neuro: alert to person and place but not time, moving all extremities GU: n/a  Resolved Hospital Problem list     Assessment & Plan:  Concern for Spesis with unknown source - f/u cultures - started on cefepime, vanc and azithromycin - RSV/covid/flu negative - check extended resp viral panel  Hypovolemic Shock - received 175020mf fluid - start albumin 25g q6hrs x 3 doses - check random cortisol level - give dose of midodrine 5mg56mcontinue peripheral levo for MAP of 65  Altered Mental Status - in setting of hypoglycemia vs sepsis vs hypotension. Had episodes of delirium during prior admission - check ammonia level  Elevated LFTs and Alk Phos - CT abdomen ordered by ER - Concern for cancer spread to liver and biliary tract  ESRD on HD - consult nephrology tomorrow for HD needs  Metastatic Bladder Cancer  - notable step wise decline in her clinical status - Patient went home with palliative care after last admission with plan to transition to hospice if quality of life declined.  - No further treatment per oncology/urology teams - Recent CT chest concerning for metastatic spread in the  lymph nodes and lungs  PE/DVT - continue eliquis  Protein calorie malnutrition - in setting of malignancy   Best Practice (right click and "Reselect all SmartList Selections" daily)   Diet/type: NPO w/ oral meds DVT prophylaxis: DOAC GI prophylaxis: N/A Lines: N/A Foley:  N/A Code Status:  DNR Last date of multidisciplinary goals of care discussion [Per family discussion today and prior palliative care notes]  Labs   CBC: Recent Labs   Lab 06/28/22 1125  WBC 27.0*  NEUTROABS 22.1*  HGB 10.8*  HCT 32.6*  MCV 83.8  PLT 355    Basic Metabolic Panel: Recent Labs  Lab 06/28/22 1125  NA 136  K 4.9  CL 97*  CO2 27  GLUCOSE 56*  BUN 34*  CREATININE 6.35*  CALCIUM 7.9*   GFR: CrCl cannot be calculated (Unknown ideal weight.). Recent Labs  Lab 06/28/22 1125  WBC 27.0*    Liver Function Tests: Recent Labs  Lab 06/28/22 1125  AST 236*  ALT 63*  ALKPHOS 247*  BILITOT 1.3*  PROT 6.3*  ALBUMIN <1.5*   Recent Labs  Lab 06/28/22 1125  LIPASE 26   No results for input(s): "AMMONIA" in the last 168 hours.  ABG    Component Value Date/Time   TCO2 31 11/08/2016 1131     Coagulation Profile: No results for input(s): "INR", "PROTIME" in the last 168 hours.  Cardiac Enzymes: No results for input(s): "CKTOTAL", "CKMB", "CKMBINDEX", "TROPONINI" in the last 168 hours.  HbA1C: Hgb A1c MFr Bld  Date/Time Value Ref Range Status  09/30/2021 09:50 AM 5.0 4.8 - 5.6 % Final    Comment:    (NOTE) Pre diabetes:          5.7%-6.4%  Diabetes:              >6.4%  Glycemic control for   <7.0% adults with diabetes   07/15/2021 08:52 AM 5.7 (H) 4.8 - 5.6 % Final    Comment:    (NOTE) Pre diabetes:          5.7%-6.4%  Diabetes:              >6.4%  Glycemic control for   <7.0% adults with diabetes     CBG: Recent Labs  Lab 06/28/22 1110 06/28/22 1318 06/28/22 1426  GLUCAP 63* 51* 139*    Review of Systems:   Review of Systems  Constitutional:  Positive for malaise/fatigue and weight loss. Negative for chills and fever.  HENT:  Negative for congestion, sinus pain and sore throat.   Eyes: Negative.   Respiratory:  Negative for cough, hemoptysis, sputum production, shortness of breath and wheezing.   Cardiovascular:  Negative for chest pain, palpitations, orthopnea, claudication and leg swelling.  Gastrointestinal:  Negative for abdominal pain, heartburn, nausea and vomiting.   Genitourinary:  Positive for hematuria.  Musculoskeletal:  Negative for joint pain and myalgias.  Skin:  Negative for rash.  Neurological:  Positive for weakness.  Endo/Heme/Allergies: Negative.   Psychiatric/Behavioral: Negative.     Past Medical History:  She,  has a past medical history of Arthritis, Diabetic foot infection ([place]) (10/02/2021), Dizziness, ESRD (end stage renal disease) ([place]), History of colon polyps, History of gout, Hyperlipidemia, Hypertension, Joint pain, PAD (peripheral artery disease) ([place]), Peripheral neuropathy, Peripheral vascular disease ([place]), Pneumonia, Pre-diabetes, Refusal of blood transfusions as patient is Jehovah's Witness, and Stroke ([place]) (1990s).   Surgical History:   Past Surgical History:  Procedure Laterality Date   ABDOMINAL AORTOGRAM W/LOWER EXTREMITY  N/A 01/30/2018   Procedure: ABDOMINAL AORTOGRAM W/LOWER EXTREMITY;  Surgeon: Lorretta Harp, MD;  Location: [facility];  Service: Cardiovascular;  Laterality: N/A;   ABDOMINAL AORTOGRAM W/LOWER EXTREMITY N/A 03/03/2020   Procedure: ABDOMINAL AORTOGRAM W/LOWER EXTREMITY;  Surgeon: Lorretta Harp, MD;  Location: [facility];  Service: Cardiovascular;  Laterality: N/A;   ABDOMINAL AORTOGRAM W/LOWER EXTREMITY N/A 08/31/2021   Procedure: ABDOMINAL AORTOGRAM W/LOWER EXTREMITY;  Surgeon: Lorretta Harp, MD;  Location: [facility];  Service: Cardiovascular;  Laterality: N/A;   AMPUTATION Left 09/02/2021   Procedure: AMPUTATION GREAT TOE;  Surgeon: Felipa Furnace, DPM;  Location: [facility];  Service: Podiatry;  Laterality: Left;   APPENDECTOMY     BASCILIC VEIN TRANSPOSITION Right 10/03/2013   Procedure: BRACHIOCEPHALIC Arteriovenous Fistula ;  Surgeon: Mal Misty, MD;  Location: [facility];  Service: Vascular;  Laterality: Right;   [place] Right 11/17/2016   Procedure: BASCILIC VEIN TRANSPOSITION- RIGHT SINGLE STAGE;  Surgeon: Conrad Troup, MD;  Location: [facility];   Service: Vascular;  Laterality: Right;   COLONOSCOPY     CYST EXCISION  1980's   cyst removed from lower abdomen   DILATION AND CURETTAGE OF UTERUS  1987   ESOPHAGOGASTRODUODENOSCOPY     PERIPHERAL VASCULAR BALLOON ANGIOPLASTY Right 01/30/2018   Procedure: PERIPHERAL VASCULAR BALLOON ANGIOPLASTY;  Surgeon: Lorretta Harp, MD;  Location: [facility];  Service: Cardiovascular;  Laterality: Right;  superficial femoral   PERIPHERAL VASCULAR BALLOON ANGIOPLASTY Left 08/31/2021   Procedure: PERIPHERAL VASCULAR BALLOON ANGIOPLASTY;  Surgeon: Lorretta Harp, MD;  Location: [facility];  Service: Cardiovascular;  Laterality: Left;   PERIPHERAL VASCULAR INTERVENTION  03/03/2020   Procedure: PERIPHERAL VASCULAR INTERVENTION;  Surgeon: Lorretta Harp, MD;  Location: [facility];  Service: Cardiovascular;;  external iliac stent left sfa lithotripsy/ drug coated balloon   SHOULDER ARTHROSCOPY W/ ROTATOR CUFF REPAIR Right 2013   TRANSURETHRAL RESECTION OF BLADDER TUMOR N/A 07/22/2021   Procedure: TRANSURETHRAL RESECTION OF BLADDER TUMOR (TURBT);  Surgeon: Vira Agar, MD;  Location[facility];  Service: Urology;  Laterality: N/A;   TUBAL LIGATION  1986   UPPER EXTREMITY VENOGRAPHY Bilateral 11/08/2016   Procedure: Bilateral Upper Extremity Venography;  Surgeon: Conrad Ovid, MD;  Location: [facility];  Service: Cardiovascular;  Laterality: Bilateral;     Social History:   reports that she quit smoking about 54 years ago. Her smoking use included cigarettes. She has a 2.50 pack-year smoking history. She has never used smokeless tobacco. She reports that she does not currently use alcohol. She reports that she does not currently use drugs after having used the following drugs: Marijuana.   Family History:  Her family history includes Depression in her father, mother, and sister; Hypertension in her mother and sister.   Allergies Allergies  Allergen Reactions    Blood-Group Specific Substance    Tylenol [Acetaminophen] Rash     Home Medications  Prior to Admission medications   Medication Sig Start Date End Date Taking? Authorizing Provider  allopurinol (ZYLOPRIM) 100 MG tablet Take 100 mg by mouth daily. 05/25/13   [provider]  apixaban (ELIQUIS) 5 MG TABS tablet Take 2 tablets (10 mg total) by mouth 2 (two) times daily for 4 days, THEN 1 tablet (5 mg total) 2 (two) times daily for 26 days. 06/10/22 07/10/22  Patrecia Pour, MD  aspirin EC 81 MG tablet Take 81  mg by mouth 4 (four) times a week.    [provider]  atorvastatin (LIPITOR) 80 MG tablet TAKE 1 TABLET BY MOUTH ONCE DAILY AT  6PM Patient taking differently: Take 80 mg by mouth every evening. 12/25/21   Lorretta Harp, MD  Blood Glucose Monitoring Suppl (ACCU-CHEK AVIVA PLUS) w/Device KIT  10/29/17   [provider]  cinacalcet (SENSIPAR) 60 MG tablet Take 60 mg by mouth every Monday, Wednesday, and Friday.    [provider]  gabapentin (NEURONTIN) 100 MG capsule Take 1 capsule by mouth every morning and 1 capsule at bedtime. PATIENT NEEDS OFFICE VISIT FOR ADDITIONAL REFILLS Patient taking differently: Take 100 mg by mouth 2 (two) times daily as needed (pain). 05/25/13   Collene Leyden, PA-C  glucose blood test strip Use to test blood sugar daily. Dx code: 250.02. 11/06/12   Theda Sers, PA-C  Lancets MISC Use to test blood sugar daily. Dx code 79.02. 11/08/12   Collene Leyden, PA-C  lidocaine-prilocaine (EMLA) cream Apply 1 application topically Every Tuesday,Thursday,and Saturday with dialysis.  01/12/18   [provider]  midodrine (PROAMATINE) 5 MG tablet Take 1 tablet (5 mg total) by mouth 3 (three) times daily with meals. 06/10/22   Patrecia Pour, MD  multivitamin (RENA-VIT) TABS tablet Take 1 tablet by mouth daily.    [provider]  oxybutynin (DITROPAN) 5 MG tablet Take 1 tablet (5 mg total) by mouth 3 (three) times daily  as needed for bladder spasms. 07/22/21   Vira Agar, MD  oxyCODONE (ROXICODONE) 5 MG immediate release tablet Take 1 tablet (5 mg total) by mouth every 8 (eight) hours as needed. Patient taking differently: Take 5 mg by mouth every 8 (eight) hours as needed for moderate pain or severe pain. 09/04/21 09/04/22  Eugenie Filler, MD  VELPHORO 500 MG chewable tablet Chew 500 mg by mouth 3 (three) times daily with meals. 11/05/21   [provider]     Critical care time: 28 minutes    Freda Jackson, MD [place] Office: [phone]   See Amion for personal pager PCCM on call pager [phone] until 7pm. Please call Elink 7p-7a. [phone]

## 2022-06-29 DIAGNOSIS — Z7189 Other specified counseling: Secondary | ICD-10-CM | POA: Diagnosis not present

## 2022-06-29 DIAGNOSIS — A419 Sepsis, unspecified organism: Secondary | ICD-10-CM | POA: Diagnosis not present

## 2022-06-29 DIAGNOSIS — R6521 Severe sepsis with septic shock: Secondary | ICD-10-CM

## 2022-06-29 LAB — CBC
HCT: 26 % — ABNORMAL LOW (ref 36.0–46.0)
Hemoglobin: 9.1 g/dL — ABNORMAL LOW (ref 12.0–15.0)
MCH: 28.9 pg (ref 26.0–34.0)
MCHC: 35 g/dL (ref 30.0–36.0)
MCV: 82.5 fL (ref 80.0–100.0)
Platelets: 310 10*3/uL (ref 150–400)
RBC: 3.15 MIL/uL — ABNORMAL LOW (ref 3.87–5.11)
RDW: 20.4 % — ABNORMAL HIGH (ref 11.5–15.5)
WBC: 29.1 10*3/uL — ABNORMAL HIGH (ref 4.0–10.5)
nRBC: 0 % (ref 0.0–0.2)

## 2022-06-29 LAB — GLUCOSE, CAPILLARY
Glucose-Capillary: 131 mg/dL — ABNORMAL HIGH (ref 70–99)
Glucose-Capillary: 114 mg/dL — ABNORMAL HIGH (ref 70–99)
Glucose-Capillary: 102 mg/dL — ABNORMAL HIGH (ref 70–99)
Glucose-Capillary: 78 mg/dL (ref 70–99)
Glucose-Capillary: 86 mg/dL (ref 70–99)
Glucose-Capillary: 89 mg/dL (ref 70–99)

## 2022-06-29 LAB — APTT: aPTT: 47 seconds — ABNORMAL HIGH (ref 24–36)

## 2022-06-29 LAB — BASIC METABOLIC PANEL
Anion gap: 14 (ref 5–15)
BUN: 31 mg/dL — ABNORMAL HIGH (ref 8–23)
CO2: 26 mmol/L (ref 22–32)
Calcium: 8 mg/dL — ABNORMAL LOW (ref 8.9–10.3)
Chloride: 97 mmol/L — ABNORMAL LOW (ref 98–111)
Creatinine, Ser: 5.07 mg/dL — ABNORMAL HIGH (ref 0.44–1.00)
GFR, Estimated: 8 mL/min — ABNORMAL LOW (ref >60)
Glucose, Bld: 89 mg/dL (ref 70–99)
Potassium: 4 mmol/L (ref 3.5–5.1)
Sodium: 137 mmol/L (ref 135–145)

## 2022-06-29 LAB — LACTIC ACID, PLASMA: Lactic Acid, Venous: 1.8 mmol/L (ref 0.5–1.9)

## 2022-06-29 LAB — HEPARIN LEVEL (UNFRACTIONATED): Heparin Unfractionated: 1.05 IU/mL — ABNORMAL HIGH (ref 0.30–0.70)

## 2022-06-29 MED ORDER — MIDODRINE HCL 5 MG PO TABS
10.0000 mg | ORAL_TABLET | Freq: Three times a day (TID) | ORAL | Status: DC
  Filled 2022-06-29 (×2): qty 2

## 2022-06-29 MED ORDER — HEPARIN BOLUS VIA INFUSION
3000.0000 [IU] | Freq: Once | INTRAVENOUS | Status: DC
  Filled 2022-06-29: qty 3000

## 2022-06-29 MED ORDER — HEPARIN BOLUS VIA INFUSION
4000.0000 [IU] | Freq: Once | INTRAVENOUS | Status: AC
  Administered 2022-06-29: 4000 [IU] via INTRAVENOUS
  Filled 2022-06-29: qty 4000

## 2022-06-29 MED ORDER — HEPARIN (PORCINE) 25000 UT/250ML-% IV SOLN
1100.0000 [IU]/h | INTRAVENOUS | Status: DC
  Administered 2022-06-29: 1000 [IU]/h via INTRAVENOUS
  Filled 2022-06-29 (×2): qty 250

## 2022-06-29 MED ORDER — ALBUMIN HUMAN 25 % IV SOLN
INTRAVENOUS | Status: AC
  Administered 2022-06-29: 25 g
  Filled 2022-06-29: qty 100

## 2022-06-29 MED ORDER — HEPARIN BOLUS VIA INFUSION
2500.0000 [IU] | Freq: Once | INTRAVENOUS | Status: AC
  Administered 2022-06-29: 2500 [IU] via INTRAVENOUS
  Filled 2022-06-29: qty 2500

## 2022-06-29 MED ORDER — DOXERCALCIFEROL 4 MCG/2ML IV SOLN
1.0000 ug | INTRAVENOUS | Status: DC
  Filled 2022-06-29: qty 2

## 2022-06-29 MED ORDER — SODIUM CHLORIDE 0.9 % IV SOLN
2.0000 g | INTRAVENOUS | Status: DC
  Administered 2022-06-29 – 2022-07-01 (×3): 2 g via INTRAVENOUS
  Filled 2022-06-29 (×3): qty 20

## 2022-06-29 MED ORDER — PANTOPRAZOLE SODIUM 40 MG IV SOLR
40.0000 mg | Freq: Two times a day (BID) | INTRAVENOUS | Status: DC
  Administered 2022-06-30 – 2022-07-01 (×4): 40 mg via INTRAVENOUS
  Filled 2022-06-29 (×4): qty 10

## 2022-06-29 MED ORDER — CHLORHEXIDINE GLUCONATE CLOTH 2 % EX PADS
6.0000 | MEDICATED_PAD | Freq: Every day | CUTANEOUS | Status: DC
  Administered 2022-06-30: 6 via TOPICAL

## 2022-06-29 NOTE — TOC Progression Note (Signed)
Transition of Care [facility]) - Initial/Assessment Note    Patient Details  Name: Joanne Carlson MRN: [id number] Date of Birth: 10/28/1945  [facility] of [facility]) CM/SW Contact:    Milinda Antis, LCSWA Phone Number: 06/29/2022, 4:12 PM  Clinical Narrative:                 Transition of Care Department [facility]) has reviewed patient.  Patient is from home with daughter.  We will continue to monitor patient advancement through interdisciplinary progression rounds. If new patient transition needs arise, please place a TOC consult.     Patient Goals and CMS Choice            Expected Discharge Plan and Services                                              Prior Living Arrangements/Services                       Activities of Daily Living      Permission Sought/Granted                  Emotional Assessment              Admission diagnosis:  Septic shock ([place]) [A41.9, R65.21] Patient Active Problem List   Diagnosis Date Noted   Septic shock ([place]) 06/28/2022   Pulmonary embolism ([place]) 06/08/2022   Pulmonary embolus ([place]) 06/07/2022   DVT (deep venous thrombosis) ([place]) 06/07/2022   Leukocytosis 06/07/2022   Transient hypotension 06/07/2022   Diabetic foot infection ([place]) 10/02/2021   Cellulitis    Medication monitoring encounter    Osteomyelitis ([place]) 08/29/2021   PVD (peripheral vascular disease) ([place]) 08/29/2021   Bladder cancer ([place]) 07/22/2021   Aortic stenosis 04/08/2021   Lumbar radiculopathy 10/15/2020   Lumbar pain 10/15/2020   Hypercalcemia 04/25/2020   Claudication in peripheral vascular disease ([place]) 03/03/2020   Closed fracture of base of proximal phalanx of finger 01/23/2020   Right wrist pain 01/09/2020   Pain of left hand 12/24/2019   Allergy, unspecified, initial encounter 02/05/2019   Anaphylactic shock, unspecified, initial encounter 02/05/2019   Encounter for removal of sutures 07/15/2018   Iron deficiency anemia,  unspecified 02/27/2018   Atherosclerosis of native arteries of the extremities with gangrene ([place]) 11/23/2017   Critical lower limb ischemia ([place]) 01/07/2017   Shortness of breath 12/04/2016   Unspecified protein-calorie malnutrition ([place]) 12/02/2016   Encounter for immunization 11/22/2016   Dependence on renal dialysis ([place]) 11/16/2016   Coagulation defect, unspecified ([place]) 10/19/2016   Dorsalgia, unspecified 10/12/2016   Gout, unspecified 10/12/2016   Hyperkalemia 10/12/2016   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 stage renal disease ([place]) 10/12/2016   Morbid (severe) obesity due to excess calories ([place]) 10/12/2016   Secondary hyperparathyroidism of renal origin ([place]) 10/12/2016   Chronic renal insufficiency, stage IV (severe) ([place]) 11/13/2013   End stage renal disease ([place]) 07/20/2013   Mechanical complication of other vascular device, implant, and graft 07/20/2013   Other specified disorders of kidney and ureter 11/30/2011   History of recurrent transient ischemic attacks 09/27/2011   HYPERCHOLESTEROLEMIA 08/07/2008   ANEMIA 08/07/2008   RENAL INSUFFICIENCY 08/07/2008   ALLERGIC RHINITIS 03/04/2008   Type 2 diabetes mellitus without complication, with long-term current use of insulin ([place]) 03/15/2007  HYPERTENSION, BENIGN 03/15/2007   PCP:  Lurline Del, DO Pharmacy:   [facility] [address] [place] [address] Phone: [phone] Fax: [place] [address] Phone: [phone] Fax: [phone]  [facility] [address] [place] [address] Phone: [phone] Fax: [phone]     Social Determinants of Health (SDOH) Social History: [place]: No Food Insecurity (06/08/2022)  Housing: Low Risk  (06/08/2022)  Transportation  Needs: No Transportation Needs (06/08/2022)  Utilities: Not At Risk (06/08/2022)  Depression (PHQ2-9): Low Risk  (10/02/2021)  Tobacco Use: Medium Risk (06/28/2022)   SDOH Interventions:     Readmission Risk Interventions    06/10/2022   11:35 AM 09/04/2021   11:18 AM  Readmission Risk Prevention Plan  Transportation Screening Complete Complete  PCP or Specialist Appt within 3-5 Days  Complete  HRI or [place]  Complete  Social Work Consult for [place] Planning/Counseling  Complete  Palliative Care Screening  Not Applicable  Medication Review Press photographer) Referral to Pharmacy Complete  HRI or [place] Complete   SW Recovery Care/Counseling Consult Complete   Palliative Care Screening Complete   [place] Not Applicable

## 2022-06-29 NOTE — Plan of Care (Signed)

## 2022-06-29 NOTE — Consult Note (Addendum)
Renal Service Consult Note [facility]  Joanne Carlson 06/29/2022 Joanne Blazing, MD Requesting Physician: Dr. Charlsie Carlson  Reason for Consult: ESRD pt w/ sepsis and AMS HPI: The patient is a 76 y.o. year-old w/ PMH as below who presented to ED for progressive gen'd weakness, confusion and poor appetite. Takes midodrine 5 tid for low BP's. In ED BP 80/30, rec'd IV bolus 500 cc then another 1 L bolus. Pressors were started. Temp was 102.7 F, blood cx's were sent and IV abx started. Pt admitted to ICU. WBC 27K. Asked to see for dialysis.   Last HD was 12/23. Pt not answering questions. Pt is DNR.   ROS - n/a   Past Medical History  Past Medical History:  Diagnosis Date   Arthritis    "legs, knees; not bad" (01/30/2018)   Diabetic foot infection ([place]) 10/02/2021   Dizziness    occasionally   ESRD (end stage renal disease) ([place])    Joanne Carlson; TTS" (01/30/2018)   History of colon polyps    History of gout    takes Allopurinol daily (01/30/2018)   Hyperlipidemia    takes Pravastatin daily   Hypertension    takes Monopril daily   Joint pain    PAD (peripheral artery disease) (H[facility])    Peripheral neuropathy    Peripheral vascular disease ([place])    Pneumonia    Pre-diabetes    Refusal of blood transfusions as patient is Jehovah's Witness    NO BLOOD OR BLOOD PRODUCTS. Albumin okay.    Stroke [facility]) 1990s   "mini stroke; left lower mouth a little bit twisted since" (01/30/2018)   Past Surgical History  Past Surgical History:  Procedure Laterality Date   ABDOMINAL AORTOGRAM W/LOWER EXTREMITY N/A 01/30/2018   Procedure: ABDOMINAL AORTOGRAM W/LOWER EXTREMITY;  Surgeon: Lorretta Harp, MD;  Location: [place] [facility];  Service: Cardiovascular;  Laterality: N/A;   ABDOMINAL AORTOGRAM W/LOWER EXTREMITY N/A 03/03/2020   Procedure: ABDOMINAL AORTOGRAM W/LOWER EXTREMITY;  Surgeon: Lorretta Harp, MD;  Location: [place] [facility];  Service: Cardiovascular;   Laterality: N/A;   ABDOMINAL AORTOGRAM W/LOWER EXTREMITY N/A 08/31/2021   Procedure: ABDOMINAL AORTOGRAM W/LOWER EXTREMITY;  Surgeon: Lorretta Harp, MD;  Location: [place] [facility];  Service: Cardiovascular;  Laterality: N/A;   AMPUTATION Left 09/02/2021   Procedure: AMPUTATION GREAT TOE;  Surgeon: Felipa Furnace, DPM;  Location: [place];  Service: Podiatry;  Laterality: Left;   APPENDECTOMY     BASCILIC VEIN TRANSPOSITION Right 10/03/2013   Procedure: BRACHIOCEPHALIC Arteriovenous Fistula ;  Surgeon: Mal Misty, MD;  Location: [place];  Service: Vascular;  Laterality: Right;   [place] Right 11/17/2016   Procedure: BASCILIC VEIN TRANSPOSITION- RIGHT SINGLE STAGE;  Surgeon: Conrad Arthur, MD;  Location: [place];  Service: Vascular;  Laterality: Right;   COLONOSCOPY     CYST EXCISION  1980's   cyst removed from lower abdomen   DILATION AND CURETTAGE OF UTERUS  1987   ESOPHAGOGASTRODUODENOSCOPY     PERIPHERAL VASCULAR BALLOON ANGIOPLASTY Right 01/30/2018   Procedure: PERIPHERAL VASCULAR BALLOON ANGIOPLASTY;  Surgeon: Lorretta Harp, MD;  Location: [place] [facility];  Service: Cardiovascular;  Laterality: Right;  superficial femoral   PERIPHERAL VASCULAR BALLOON ANGIOPLASTY Left 08/31/2021   Procedure: PERIPHERAL VASCULAR BALLOON ANGIOPLASTY;  Surgeon: Lorretta Harp, MD;  Location: [place] [facility];  Service: Cardiovascular;  Laterality: Left;   PERIPHERAL VASCULAR INTERVENTION  03/03/2020   Procedure:  PERIPHERAL VASCULAR INTERVENTION;  Surgeon: Lorretta Harp, MD;  Location: [place] [facility];  Service: Cardiovascular;;  external iliac stent left sfa lithotripsy/ drug coated balloon   SHOULDER ARTHROSCOPY W/ ROTATOR CUFF REPAIR Right 2013   TRANSURETHRAL RESECTION OF BLADDER TUMOR N/A 07/22/2021   Procedure: TRANSURETHRAL RESECTION OF BLADDER TUMOR (TURBT);  Surgeon: Vira Agar, MD;  Location: [facility];  Service: Urology;  Laterality: N/A;   TUBAL LIGATION   1986   UPPER EXTREMITY VENOGRAPHY Bilateral 11/08/2016   Procedure: Bilateral Upper Extremity Venography;  Surgeon: Conrad Mercer, MD;  Location: [place] [facility];  Service: Cardiovascular;  Laterality: Bilateral;   Family History  Family History  Problem Relation Age of Onset   Depression Mother    Hypertension Mother    Depression Father    Depression Sister    Hypertension Sister    Social History  reports that she quit smoking about 54 years ago. Her smoking use included cigarettes. She has a 2.50 pack-year smoking history. She has never used smokeless tobacco. She reports that she does not currently use alcohol. She reports that she does not currently use drugs after having used the following drugs: Marijuana. Allergies  Allergies  Allergen Reactions   Blood-Group Specific Substance    Tylenol [Acetaminophen] Rash   Home medications Prior to Admission medications   Medication Sig Start Date End Date Taking? Authorizing Provider  allopurinol (ZYLOPRIM) 100 MG tablet Take 100 mg by mouth daily. 05/25/13  Yes [provider]  atorvastatin (LIPITOR) 80 MG tablet TAKE 1 TABLET BY MOUTH ONCE DAILY AT  6PM Patient taking differently: Take 80 mg by mouth every evening. 12/25/21  Yes Lorretta Harp, MD  cinacalcet [facility]) 30 MG tablet Take 30 mg by mouth every Monday, Wednesday, and Friday.   Yes [provider]  gabapentin (NEURONTIN) 100 MG capsule Take 1 capsule by mouth every morning and 1 capsule at bedtime. PATIENT NEEDS OFFICE VISIT FOR ADDITIONAL REFILLS Patient taking differently: Take 100 mg by mouth 2 (two) times daily as needed (pain). 05/25/13  Yes Georgiann Mccoy M, PA-C  lidocaine-prilocaine (EMLA) cream Apply 1 application topically Every Tuesday,Thursday,and Saturday with dialysis.  01/12/18  Yes [provider]  midodrine (PROAMATINE) 5 MG tablet Take 1 tablet (5 mg total) by mouth 3 (three) times daily with meals. 06/10/22  Yes Patrecia Pour, MD  multivitamin (RENA-VIT) TABS tablet Take 1 tablet by mouth daily.   Yes [provider]  oxybutynin (DITROPAN) 5 MG tablet Take 1 tablet (5 mg total) by mouth 3 (three) times daily as needed for bladder spasms. 07/22/21  Yes Vira Agar, MD  oxyCODONE (ROXICODONE) 5 MG immediate release tablet Take 1 tablet (5 mg total) by mouth every 8 (eight) hours as needed. Patient taking differently: Take 5 mg by mouth every 8 (eight) hours as needed for moderate pain or severe pain. 09/04/21 09/04/22 Yes Eugenie Filler, MD  VELPHORO 500 MG chewable tablet Chew 500 mg by mouth 3 (three) times daily with meals. 11/05/21  Yes [provider]  apixaban (ELIQUIS) 5 MG TABS tablet Take 2 tablets (10 mg total) by mouth 2 (two) times daily for 4 days, THEN 1 tablet (5 mg total) 2 (two) times daily for 26 days. Patient taking differently: Take 1 tablet (5 mg) by mouth twice a day.  06/10/22 07/10/22  Patrecia Pour, MD     Vitals:   06/29/22 0800 06/29/22 0900 06/29/22 1000 06/29/22 1120  BP: (!) 96/44 (!) 98/44 (!) 94/45   Pulse: 93 93 94   Resp: '13 14 14   '$ Temp:    98.1 F (36.7 C)  TempSrc:    Axillary  SpO2: 100% 100% 100%   Weight:      Height:       Exam Gen not responding No rash, cyanosis or gangrene Sclera anicteric, throat not seen  No jvd or bruits Chest clear bilat to bases, no rales/ wheezing RRR no MRG Abd soft ntnd no mass or ascites +bs GU deferred MS no joint effusions or deformity Ext no LE or UE edema, no wounds or ulcers Neuro is mostly obtunded, not awakening to stimulation    RUE AVF+bruit    Home meds include - allopurinol, lipitor, sensipar, neurontin 100 bid, midodrine 5 tid, renavite, oxy IR prn, velphoro 500 tid ac, eliquis bid, prns  CT abd >> IMPRESSION: New small bilateral pleural effusions and bibasilar atelectasis/consolidation. New trace perihepatic ascites. Stable pulmonary nodules, hepatic lesions, and pelvic adenopathy consistent with  metastatic disease, presumably related to 10.8 cm mass in the region of the urinary bladder CXR 12/25- IMPRESSION: Central pulmonary vessels are prominent. Increased interstitial markings c/w possible mild interstitial edema or interstitial pneumonia. There is no focal pulmonary consolidation. Subsegmental atelectasis. Small right pleural effusion     Na 136 K 4.9  CO2 27  BUN 34  Cr 6.3  Alb <1.5  AST 236/ ALT 63   Tbili 1.3  WBC 27K  Hb 10.8    OP HD: TTS G-O 3.5h  350/700  57.2kg  2/2.5 bath  Heparin 4000  RUE AVF - last HD 12/23, post wt 59.2kg - bp's low 80s- 100s  - hectorol 1 mcg IV tiw - mircera 200 mcg q2, last 12/21, due 07/08/22    Assessment/ Plan: Shock - high fevers, presumed sepsis. On pressors and IV abx AMS - likely due to #1, #3 Metastatic bladder cancer - pt declining per H&P H/o DVT/ PE - eliquis changed to IV heparin  ESRD - on HD TTS. BP's soft on pressor support. Does not have urgent indication for HD now, will plan HD tomorrow if BP's are stable enough. May need IV albumin support.  Not a good CRRT candidate given overall debility, comorbidities and DNR status.  Volume - up 4kg by weights, min UF given shock Acute/ chronic hypotension - was on po midodrine 5 tid at home. On pressor support here.  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 Next esa due 1/4.  MBD ckd - CCa and phos in range. Albumin very low. Cont IV vdra, hold binders while not eating.  DNR       Kelly Splinter  MD 06/29/2022, 12:31 PM Recent Labs  Lab 06/28/22 1125 06/28/22 2153  HGB 10.8*  --   ALBUMIN <1.5*  --   CALCIUM 7.9*  --   PHOS  --  4.1  CREATININE 6.35*  --   K 4.9  --    Inpatient medications:  acetaminophen (TYLENOL) oral liquid 160 mg/5 mL  650 mg Oral Once   Chlorhexidine Gluconate Cloth  6 each Topical Daily   midodrine  10 mg Oral Q8H    sodium chloride 10 mL/hr at 06/29/22 1000   azithromycin Stopped (06/28/22 1852)   cefTRIAXone (ROCEPHIN)  IV Stopped (06/29/22  0916)   heparin 1,000 Units/hr (06/29/22 1000)   norepinephrine (LEVOPHED) Adult infusion 6 mcg/min (06/29/22 1141)   docusate sodium, mouth rinse, polyethylene  glycol

## 2022-06-29 NOTE — Consult Note (Signed)
Consultation Note Date: 06/29/2022   Patient Name: Joanne Carlson  DOB: 1945/12/30  MRN: [id number]  Age / Sex: 76 y.o., female  PCP: Lurline Del, DO Referring Physician: Candee Furbish, MD  Reason for Consultation: Establishing goals of care  HPI/Patient Profile: 76 y.o. female  with past medical history of hypertension, HLD, ESRD on HD T/Th/S, PVD, DMII and metastatic bladder cancer  admitted on 06/28/2022 with altered mental status.   Patient was previously hospitalized from 12/5 to 12/20 with PE and RLE DVT.  She is familiar to this PA from prior hospitalization.  She is now admitted after being brought in with hypotension and started on Levophed.  She was also febrile and cultures were drawn with concern for sepsis.  Patient with chronic failure to thrive and hypovolemic shock. PMT has been consulted to assist with goals of care conversation.  Clinical Assessment and Goals of Care:  I have reviewed medical records including EPIC notes, labs and imaging, discussed with HD RN, assessed the patient and then had a phone conversation with patient's daughter/HCPOA [name] to discuss diagnosis prognosis, GOC, EOL wishes, disposition and options.  I introduced Palliative Medicine as specialized medical care for people living with serious illness. It focuses on providing relief from the symptoms and stress of a serious illness. The goal is to improve quality of life for both the patient and the family.  We discussed a brief life review of the patient and then focused on their current illness.  The natural disease trajectory and expectations at EOL were discussed.  I attempted to elicit values and goals of care important to the patient.    Medical History Review and Understanding:  Patient's daughter has a good understanding the severity of patient's illness.  She understands that it is likely time for transition to  hospice, however she is also curious whether blood cultures will return positive for any treatable source of sepsis or if antibiotics will improve patient's condition.  Social History: Patient resides with her daughter.  Functional and Nutritional State: Patient was at her baseline until she was brought in yesterday.  Advance Directives: A detailed discussion regarding advanced directives was had.  Advance directives on file remains accurate.   Code Status: Concepts specific to code status, artifical feeding and hydration, and rehospitalization were considered and discussed.  DNR confirmed.  Discussion: Reviewed results of diagnostics completed thus far with patient's daughter.  She states that she was trying to make sense of some of the terminology being used, such as gallbladder distention, and most of the updates received from the primary team have made sense to her.  We discussed patient's high risk for further decline and explored patient's conversations regarding hospice since discharge last hospitalization.  [name] states they had a discussion with the outpatient palliative care agency but did not discuss hospice yet since she is maintaining her baseline.  She understands that it is likely time for this transition now that patient has already declined so rapidly.  She is leaving work soon and plans to come visit tonight.  She will also be here tomorrow and is agreeable to scheduling a goals of care discussion based on how her mother is doing and any further information available regarding the source of the possible sepsis.  GOC discussion scheduled for 10 AM and offered to include her brother as well.  She verbalizes her understanding and appreciation.   Discussed the importance of continued conversation with family and the medical providers regarding overall plan  of care and treatment options, ensuring decisions are within the context of the patient's values and GOCs.   Questions and  concerns were addressed.  Hard Choices booklet left for review. The family was encouraged to call with questions or concerns.  PMT will continue to support holistically.   SUMMARY OF RECOMMENDATIONS   -Continue DNR -Continue full scope treatment for now -Patient has been clear during prior admission that she would prefer hospice care in the event that she declines further -Goals of care discussion scheduled for 12/27 at 10 AM with patient's daughter/HCPOA and son -PMT will continue to follow and support  Prognosis:  Poor prognosis, hospice appropriate and patient has previously shared this is her wish when her quality of life/clinical status declined further  Discharge Planning: To Be Determined      Primary Diagnoses: Present on Admission:  Septic shock [facility])   Physical Exam Vitals and nursing note reviewed.  Constitutional:      General: She is not in acute distress.    Appearance: She is ill-appearing.     Interventions: Nasal cannula in place.  Cardiovascular:     Rate and Rhythm: Normal rate.  Pulmonary:     Effort: Pulmonary effort is normal. No respiratory distress.  Skin:    General: Skin is warm and dry.  Neurological:     Mental Status: She is easily aroused. She is lethargic.    Vital Signs: BP (!) 94/45   Pulse 94   Temp 98.9 F (37.2 C) (Axillary)   Resp 14   Ht '5\' 4"'$  (1.626 m)   Wt 61 kg   SpO2 100%   BMI 23.08 kg/m  Pain Scale: Faces   Pain Score: Asleep   SpO2: SpO2: 100 % O2 Device:SpO2: 100 % O2 Flow Rate: .O2 Flow Rate (L/min): 1 L/min   Palliative Assessment/Data: 10%      MDM: high   Dani Danis [name], PA-C  Palliative Medicine Team Team phone # [phone]  Thank you for allowing the Palliative Medicine Team to assist in the care of this patient. Please utilize secure chat with additional questions, if there is no response within 30 minutes please call the above phone number.  Palliative Medicine Team providers are  available by phone from 7am to 7pm daily and can be reached through the team cell phone.  Should this patient require assistance outside of these hours, please call the patient's attending physician.

## 2022-06-29 NOTE — Progress Notes (Signed)
Received patient in bed to unit.  Alert and oriented.  Informed consent signed and in chart.   Treatment initiated: [place] Treatment completed: 1853  Patient tolerated well.   without acute distress.  Hand-off given to patient's nurse.   Access used: AVF Access issues: none  Total UF removed: 1 L Medication(s) given: Albumin 25 g IV Post HD VS: 98/40 P 96 R 15 Post HD weight: 60 kg  [facility]

## 2022-06-29 NOTE — Progress Notes (Signed)
ANTICOAGULATION CONSULT NOTE - Initial Consult  Pharmacy Consult for Heparin Indication: pulmonary embolus  Allergies  Allergen Reactions   Blood-Group Specific Substance    Tylenol [Acetaminophen] Rash    Patient Measurements: Height: '5\' 4"'$  (162.6 cm) Weight: 61 kg (134 lb 7.7 oz) IBW/kg (Calculated) : 54.7   Vital Signs: Temp: 98.1 F (36.7 C) (12/26 0727) Temp Source: Axillary (12/26 0727) BP: 96/44 (12/26 0800) Pulse Rate: 93 (12/26 0800)  Labs: Recent Labs    06/28/22 1125  HGB 10.8*  HCT 32.6*  PLT 324  CREATININE 6.35*    Estimated Creatinine Clearance: 6.4 mL/min (A) (by C-G formula based on SCr of 6.35 mg/dL (H)).   Medical History: Past Medical History:  Diagnosis Date   Arthritis    "legs, knees; not bad" (01/30/2018)   Diabetic foot infection ([place]) 10/02/2021   Dizziness    occasionally   ESRD (end stage renal disease) ([place])    [name]; TTS" (01/30/2018)   History of colon polyps    History of gout    takes Allopurinol daily (01/30/2018)   Hyperlipidemia    takes Pravastatin daily   Hypertension    takes Monopril daily   Joint pain    PAD (peripheral artery disease) ([place])    Peripheral neuropathy    Peripheral vascular disease ([place])    Pneumonia    Pre-diabetes    Refusal of blood transfusions as patient is Jehovah's Witness    NO BLOOD OR BLOOD PRODUCTS. Albumin okay.    Stroke [facility]) 1990s   "mini stroke; left lower mouth a little bit twisted since" (01/30/2018)    Assessment: 76 year old woman altered mentation per the family  Hx of hypertension, HLD, ESRD on HD T/Th/S, PVD, DMII and metastatic bladder cancer, RLL segmental pulmonary emboli and RLE DVT discharged on eliquis.  Last dose of eliquis on 12/23 per family. Pharmacy consulted to start heparin drip.  Will monitor aPTT due to lab interference of Eliquis.  Once HL and aPTT correlate, will change back to monitoring HL.  Goal of Therapy:  Heparin level 0.3-0.7 units/ml aPTT  66-102 seconds Monitor platelets by anticoagulation protocol: Yes   Plan:  Give 4000 units bolus x 1 Start heparin infusion at 1000 units/hr Check anti-Xa level and aPTT in 8 hours and daily while on heparin Continue to monitor H&H and platelets  Alanda Slim, PharmD, [facility] Clinical Pharmacist Please see AMION for all Pharmacists' Contact Phone Numbers 06/29/2022, 9:23 AM

## 2022-06-29 NOTE — Progress Notes (Signed)
Patient with large bowel movement containing bright red blood. Elink notified, per Elink MD heparin gtt paused pending STAT labs.

## 2022-06-29 NOTE — Progress Notes (Signed)
ANTICOAGULATION CONSULT NOTE - Initial Consult  Pharmacy Consult for Heparin Indication: pulmonary embolus  Allergies  Allergen Reactions   Blood-Group Specific Substance    Tylenol [Acetaminophen] Rash    Patient Measurements: Height: '5\' 4"'$  (162.6 cm) Weight: 60 kg (132 lb 4.4 oz) IBW/kg (Calculated) : 54.7   Vital Signs: Temp: 98.5 F (36.9 C) (12/26 1925) Temp Source: Oral (12/26 1925) BP: 94/41 (12/26 1915) Pulse Rate: 90 (12/26 1915)  Labs: Recent Labs    06/28/22 1125 06/29/22 1601 06/29/22 1800  HGB 10.8* 9.1*  --   HCT 32.6* 26.0*  --   PLT 324 310  --   APTT  --   --  47*  HEPARINUNFRC  --   --  1.05*  CREATININE 6.35* 5.07*  --      Estimated Creatinine Clearance: 8 mL/min (A) (by C-G formula based on SCr of 5.07 mg/dL (H)).   Medical History: Past Medical History:  Diagnosis Date   Arthritis    "legs, knees; not bad" (01/30/2018)   Diabetic foot infection ([place]) 10/02/2021   Dizziness    occasionally   ESRD (end stage renal disease) ([place])    [name]; TTS" (01/30/2018)   History of colon polyps    History of gout    takes Allopurinol daily (01/30/2018)   Hyperlipidemia    takes Pravastatin daily   Hypertension    takes Monopril daily   Joint pain    PAD (peripheral artery disease) ([place])    Peripheral neuropathy    Peripheral vascular disease ([place])    Pneumonia    Pre-diabetes    Refusal of blood transfusions as patient is Jehovah's Witness    NO BLOOD OR BLOOD PRODUCTS. Albumin okay.    Stroke [facility]) 1990s   "mini stroke; left lower mouth a little bit twisted since" (01/30/2018)    Assessment: 76 year old woman altered mentation per the family  Hx of hypertension, HLD, ESRD on HD T/Th/S, PVD, DMII and metastatic bladder cancer, RLL segmental pulmonary emboli and RLE DVT discharged on eliquis.  Last dose of eliquis on 12/23 per family. Pharmacy consulted to start heparin drip.  Will monitor aPTT due to lab interference of Eliquis.  Once  HL and aPTT correlate, will change back to monitoring HL.  PTT came back at 47 while heparin level 1.05. They are not correlating yet. We will rebolus and increase rate. Check level in AM.   Goal of Therapy:  Heparin level 0.3-0.7 units/ml aPTT 66-102 seconds Monitor platelets by anticoagulation protocol: Yes   Plan:  Give 2500 units bolus x 1 Increase heparin infusion to 1150 units/hr Check anti-Xa level and aPTT in AM and daily while on heparin Continue to monitor H&H and platelets  Onnie Boer, PharmD, BCIDP, AAHIVP, CPP Infectious Disease Pharmacist 06/29/2022 7:36 PM

## 2022-06-29 NOTE — Progress Notes (Addendum)
[place] Progress Note Patient Name: Joanne Carlson DOB: May 29, 1946 MRN: [id number]   Date of Service  06/29/2022  HPI/Events of Note  Patient had a large bloody bowel movement, she is on a Heparin gtt for PE.  eICU Interventions  Heparin gtt paused. Stat H & H, anti-Xa level, PT-INR, PTT, Type and Screen. Protonix 40 mg iv bid ordered.        Kerry Kass Seiya Silsby 06/29/2022, 11:36 PM

## 2022-06-29 NOTE — Progress Notes (Signed)
NAME:  Joanne Carlson, MRN:  [id number], DOB:  1945-10-12, LOS: 1 ADMISSION DATE:  06/28/2022, CONSULTATION DATE:  06/28/22 REFERRING MD:  Dr. Oswald Hillock CHIEF COMPLAINT:  Hypotension   History of Present Illness:  Joanne Carlson is a 76 year old woman with history of hypertension, HLD, ESRD on HD T/Th/S, PVD, DMII and metastatic bladder cancer who was recently admitted to the hospital 12/5 to 12/8 for chest pain found to have RLL segmental pulmonary emboli and RLE DVT discharged on eliquis. Since being home she has been doing ok until today where she was noted to have altered mentation per the family. She has not been eating or drinking much since being home due to decreased appetite. She is on midodrine 28m TID for low blood pressure.    She was brought in by EMS with initial BP 80/30. She was given 5064men route. She was unable to answer questions appropriately on arrival to ER. She was given another liter of fluid with MAPs in the low to mid 50s. She was started on periphral levophed. She is febrile to 102.8F. Blood cultures were drawn and she was given cefepime and vanc.    WBC count is 27k which has been elevated since 07/2021. Alk phos 247, Albumin <1.5, AST 236, ALT 63, T bili 1.3. Glucose 56 on arrival.    At time of interview patient was more alert and responsive. Daughter and son were at bedside. They agreed to albumin infusion but do not want blood transfusions. DNR remains in place as per prior palliative care notes.   Pertinent  Medical History  Metastatic Bladder Cancer ESRD on HD DMII  Significant Hospital Events: Including procedures, antibiotic start and stop dates in addition to other pertinent events   12/25 - AMS, hypotension, fever , peripheral levophed  Interim History / Subjective:  As above  Objective   Blood pressure (!) 96/44, pulse 93, temperature 98.1 F (36.7 C), temperature source Axillary, resp. rate 13, height _0  (1.626 m), weight 61 kg, SpO2 100 %.         Intake/Output Summary (Last 24 hours) at 06/29/2022 0815 Last data filed at 06/29/2022 0700 Gross per 24 hour  Intake 1271.32 ml  Output --  Net 1271.32 ml    Filed Weights   06/28/22 2011 06/29/22 0500  Weight: 61 kg 61 kg    Examination: General:  chronically ill appearing female w/ ams HEENT: MM pink/dry; [place] in place Neuro: Opens eyes to voice; confused; follows some commands CV: [id number], systolic murmur appreciated PULM:  dim clear BS bilaterally; [place] 2l GI: soft, bsx4 active  Extremities: warm/dry, no edema  Skin: no rashes or lesions appreciated  Resolved Hospital Problem list     Assessment & Plan:  Hypovolemic Shock Concern for Sepsis with unknown source -cortisol wnl P: -wean levo for map goal >65 or SBP >90 -continue azithro and add rocephin for possible cap -f/u cultures -increase midodrine dose if able to take PO   Altered Mental Status -in setting of hypoglycemia vs sepsis vs hypotension. Had episodes of delirium during prior admission P: -limit sedating meds -frequent neuro checks   Elevated LFTs and Alk Phos - CT abdomen ordered by ER; Concern for cancer spread to liver and biliary tract P: -trend LFTs   ESRD on HD: dialysis T/T/S P: -nephro consulted -will plan on dialysis tomorrow if BP stable -will likely need levo and albumin to get through dialysis -Trend BMP / urinary output -Replace electrolytes as  indicated -Avoid nephrotoxic agents, ensure adequate renal perfusion   Hypoglycemia P: -cbg monitoring  Metastatic Bladder Cancer  - notable step wise decline in her clinical status - Patient went home with palliative care after last admission with plan to transition to hospice if quality of life declined.  - No further treatment per oncology/urology teams - Recent CT chest concerning for metastatic spread in the lymph nodes and lungs P: -supportive  care -consider palliative care consult   PE/DVT P: -changing eliquis to  heparin   Protein calorie malnutrition P: -patient currently npo while altered and high aspiration risk -Spoke with daughter over phone and states that patient would not want feeding tube.   Best Practice (right click and "Reselect all SmartList Selections" daily)   Diet/type: NPO w/ oral meds DVT prophylaxis: systemic heparin GI prophylaxis: N/A Lines: N/A Foley:  N/A Code Status:  DNR Last date of multidisciplinary goals of care discussion [12/26 updated daughter over phone; deferred feeding tube placement at this time. ]  Labs   CBC: Recent Labs  Lab 06/28/22 1125  WBC 27.0*  NEUTROABS 22.1*  HGB 10.8*  HCT 32.6*  MCV 83.8  PLT 324     Basic Metabolic Panel: Recent Labs  Lab 06/28/22 1125 06/28/22 2153  NA 136  --   K 4.9  --   CL 97*  --   CO2 27  --   GLUCOSE 56*  --   BUN 34*  --   CREATININE 6.35*  --   CALCIUM 7.9*  --   MG  --  2.1  PHOS  --  4.1    GFR: Estimated Creatinine Clearance: 6.4 mL/min (A) (by C-G formula based on SCr of 6.35 mg/dL (H)). Recent Labs  Lab 06/28/22 1125 06/28/22 1645 06/28/22 2153 06/29/22 0544  WBC 27.0*  --   --   --   LATICACIDVEN  --  3.0* 3.3* 1.8     Liver Function Tests: Recent Labs  Lab 06/28/22 1125  AST 236*  ALT 63*  ALKPHOS 247*  BILITOT 1.3*  PROT 6.3*  ALBUMIN <1.5*    Recent Labs  Lab 06/28/22 1125  LIPASE 26    Recent Labs  Lab 06/28/22 1645  AMMONIA 37*     ABG    Component Value Date/Time   TCO2 31 11/08/2016 1131     Coagulation Profile: No results for input(s): "INR", "PROTIME" in the last 168 hours.  Cardiac Enzymes: No results for input(s): "CKTOTAL", "CKMB", "CKMBINDEX", "TROPONINI" in the last 168 hours.  HbA1C: Hgb A1c MFr Bld  Date/Time Value Ref Range Status  09/30/2021 09:50 AM 5.0 4.8 - 5.6 % Final    Comment:    (NOTE) Pre diabetes:          5.7%-6.4%  Diabetes:              >6.4%  Glycemic control for   <7.0% adults with diabetes    07/15/2021 08:52 AM 5.7 (H) 4.8 - 5.6 % Final    Comment:    (NOTE) Pre diabetes:          5.7%-6.4%  Diabetes:              >6.4%  Glycemic control for   <7.0% adults with diabetes     CBG: Recent Labs  Lab 06/28/22 1545 06/28/22 2013 06/28/22 2326 06/29/22 0330 06/29/22 0726  GLUCAP 134* 128* 136* 131* 114*     Review of Systems:   Review of Systems  Constitutional:  Positive for malaise/fatigue and weight loss. Negative for chills and fever.  HENT:  Negative for congestion, sinus pain and sore throat.   Eyes: Negative.   Respiratory:  Negative for cough, hemoptysis, sputum production, shortness of breath and wheezing.   Cardiovascular:  Negative for chest pain, palpitations, orthopnea, claudication and leg swelling.  Gastrointestinal:  Negative for abdominal pain, heartburn, nausea and vomiting.  Genitourinary:  Positive for hematuria.  Musculoskeletal:  Negative for joint pain and myalgias.  Skin:  Negative for rash.  Neurological:  Positive for weakness.  Endo/Heme/Allergies: Negative.   Psychiatric/Behavioral: Negative.    Past Medical History:  She,  has a past medical history of Arthritis, Diabetic foot infection ([place]) (10/02/2021), Dizziness, ESRD (end stage renal disease) ([place]), History of colon polyps, History of gout, Hyperlipidemia, Hypertension, Joint pain, PAD (peripheral artery disease) ([place]), Peripheral neuropathy, Peripheral vascular disease ([place]), Pneumonia, Pre-diabetes, Refusal of blood transfusions as patient is Jehovah's Witness, and Stroke ([place]) (1990s).   Surgical History:   Past Surgical History:  Procedure Laterality Date   ABDOMINAL AORTOGRAM W/LOWER EXTREMITY N/A 01/30/2018   Procedure: ABDOMINAL AORTOGRAM W/LOWER EXTREMITY;  Surgeon: Lorretta Harp, MD;  Location: [facility];  Service: Cardiovascular;  Laterality: N/A;   ABDOMINAL AORTOGRAM W/LOWER EXTREMITY N/A 03/03/2020   Procedure: ABDOMINAL AORTOGRAM W/LOWER EXTREMITY;   Surgeon: Lorretta Harp, MD;  Location: [facility];  Service: Cardiovascular;  Laterality: N/A;   ABDOMINAL AORTOGRAM W/LOWER EXTREMITY N/A 08/31/2021   Procedure: ABDOMINAL AORTOGRAM W/LOWER EXTREMITY;  Surgeon: Lorretta Harp, MD;  Location: [facility];  Service: Cardiovascular;  Laterality: N/A;   AMPUTATION Left 09/02/2021   Procedure: AMPUTATION GREAT TOE;  Surgeon: Felipa Furnace, DPM;  Location: [facility];  Service: Podiatry;  Laterality: Left;   APPENDECTOMY     BASCILIC VEIN TRANSPOSITION Right 10/03/2013   Procedure: BRACHIOCEPHALIC Arteriovenous Fistula ;  Surgeon: Mal Misty, MD;  Location: [facility];  Service: Vascular;  Laterality: Right;   [place] Right 11/17/2016   Procedure: BASCILIC VEIN TRANSPOSITION- RIGHT SINGLE STAGE;  Surgeon: Conrad [place], MD;  Location: [facility];  Service: Vascular;  Laterality: Right;   COLONOSCOPY     CYST EXCISION  1980's   cyst removed from lower abdomen   DILATION AND CURETTAGE OF UTERUS  1987   ESOPHAGOGASTRODUODENOSCOPY     PERIPHERAL VASCULAR BALLOON ANGIOPLASTY Right 01/30/2018   Procedure: PERIPHERAL VASCULAR BALLOON ANGIOPLASTY;  Surgeon: Lorretta Harp, MD;  Location: [facility];  Service: Cardiovascular;  Laterality: Right;  superficial femoral   PERIPHERAL VASCULAR BALLOON ANGIOPLASTY Left 08/31/2021   Procedure: PERIPHERAL VASCULAR BALLOON ANGIOPLASTY;  Surgeon: Lorretta Harp, MD;  Location: [facility];  Service: Cardiovascular;  Laterality: Left;   PERIPHERAL VASCULAR INTERVENTION  03/03/2020   Procedure: PERIPHERAL VASCULAR INTERVENTION;  Surgeon: Lorretta Harp, MD;  Location: [facility];  Service: Cardiovascular;;  external iliac stent left sfa lithotripsy/ drug coated balloon   SHOULDER ARTHROSCOPY W/ ROTATOR CUFF REPAIR Right 2013   TRANSURETHRAL RESECTION OF BLADDER TUMOR N/A 07/22/2021   Procedure: TRANSURETHRAL RESECTION OF BLADDER TUMOR (TURBT);  Surgeon: Vira Agar, MD;  Location: [facility];  Service: Urology;  Laterality: N/A;   TUBAL LIGATION  1986   UPPER EXTREMITY VENOGRAPHY Bilateral 11/08/2016   Procedure: Bilateral Upper Extremity Venography;  Surgeon: Conrad Morral, MD;  Location: [facility];  Service: Cardiovascular;  Laterality: Bilateral;  Social History:   reports that she quit smoking about 54 years ago. Her smoking use included cigarettes. She has a 2.50 pack-year smoking history. She has never used smokeless tobacco. She reports that she does not currently use alcohol. She reports that she does not currently use drugs after having used the following drugs: Marijuana.   Family History:  Her family history includes Depression in her father, mother, and sister; Hypertension in her mother and sister.   Allergies Allergies  Allergen Reactions   Blood-Group Specific Substance    Tylenol [Acetaminophen] Rash     Home Medications  Prior to Admission medications   Medication Sig Start Date End Date Taking? Authorizing Provider  allopurinol (ZYLOPRIM) 100 MG tablet Take 100 mg by mouth daily. 05/25/13   [provider]  apixaban (ELIQUIS) 5 MG TABS tablet Take 2 tablets (10 mg total) by mouth 2 (two) times daily for 4 days, THEN 1 tablet (5 mg total) 2 (two) times daily for 26 days. 06/10/22 07/10/22  Patrecia Pour, MD  aspirin EC 81 MG tablet Take 81 mg by mouth 4 (four) times a week.    [provider]  atorvastatin (LIPITOR) 80 MG tablet TAKE 1 TABLET BY MOUTH ONCE DAILY AT  6PM Patient taking differently: Take 80 mg by mouth every evening. 12/25/21   Lorretta Harp, MD  cinacalcet (SENSIPAR) 60 MG tablet Take 60 mg by mouth every Monday, Wednesday, and Friday.    [provider]  gabapentin (NEURONTIN) 100 MG capsule Take 1 capsule by mouth every morning and 1 capsule at bedtime. PATIENT NEEDS OFFICE VISIT FOR ADDITIONAL REFILLS Patient taking differently: Take 100 mg by mouth 2 (two) times daily  as needed (pain). 05/25/13   Collene Leyden, PA-C  lidocaine-prilocaine (EMLA) cream Apply 1 application topically Every Tuesday,Thursday,and Saturday with dialysis.  01/12/18   [provider]  midodrine (PROAMATINE) 5 MG tablet Take 1 tablet (5 mg total) by mouth 3 (three) times daily with meals. 06/10/22   Patrecia Pour, MD  multivitamin (RENA-VIT) TABS tablet Take 1 tablet by mouth daily.    [provider]  oxybutynin (DITROPAN) 5 MG tablet Take 1 tablet (5 mg total) by mouth 3 (three) times daily as needed for bladder spasms. 07/22/21   Vira Agar, MD  oxyCODONE (ROXICODONE) 5 MG immediate release tablet Take 1 tablet (5 mg total) by mouth every 8 (eight) hours as needed. Patient taking differently: Take 5 mg by mouth every 8 (eight) hours as needed for moderate pain or severe pain. 09/04/21 09/04/22  Eugenie Filler, MD  VELPHORO 500 MG chewable tablet Chew 500 mg by mouth 3 (three) times daily with meals. 11/05/21   [provider]     Critical care time: 35 minutes    JD Geryl [facility] 06/29/2022, 9:15 AM  Please see [email or web address] for pager details.  From 7A-7P if no response, please call [phone]. After hours, please call ELink [phone].

## 2022-06-30 DIAGNOSIS — Z7189 Other specified counseling: Secondary | ICD-10-CM | POA: Diagnosis not present

## 2022-06-30 DIAGNOSIS — R6521 Severe sepsis with septic shock: Secondary | ICD-10-CM | POA: Diagnosis not present

## 2022-06-30 DIAGNOSIS — A419 Sepsis, unspecified organism: Secondary | ICD-10-CM | POA: Diagnosis not present

## 2022-06-30 LAB — BASIC METABOLIC PANEL
Anion gap: 17 — ABNORMAL HIGH (ref 5–15)
BUN: 20 mg/dL (ref 8–23)
CO2: 22 mmol/L (ref 22–32)
Calcium: 8.3 mg/dL — ABNORMAL LOW (ref 8.9–10.3)
Chloride: 95 mmol/L — ABNORMAL LOW (ref 98–111)
Creatinine, Ser: 4.41 mg/dL — ABNORMAL HIGH (ref 0.44–1.00)
GFR, Estimated: 10 mL/min — ABNORMAL LOW (ref >60)
Glucose, Bld: 81 mg/dL (ref 70–99)
Potassium: 4.2 mmol/L (ref 3.5–5.1)
Sodium: 134 mmol/L — ABNORMAL LOW (ref 135–145)

## 2022-06-30 LAB — GLUCOSE, CAPILLARY
Glucose-Capillary: 84 mg/dL (ref 70–99)
Glucose-Capillary: 90 mg/dL (ref 70–99)
Glucose-Capillary: 86 mg/dL (ref 70–99)
Glucose-Capillary: 102 mg/dL — ABNORMAL HIGH (ref 70–99)
Glucose-Capillary: 108 mg/dL — ABNORMAL HIGH (ref 70–99)
Glucose-Capillary: 118 mg/dL — ABNORMAL HIGH (ref 70–99)

## 2022-06-30 LAB — CBC
HCT: 30.6 % — ABNORMAL LOW (ref 36.0–46.0)
Hemoglobin: 10.2 g/dL — ABNORMAL LOW (ref 12.0–15.0)
MCH: 27.7 pg (ref 26.0–34.0)
MCHC: 33.3 g/dL (ref 30.0–36.0)
MCV: 83.2 fL (ref 80.0–100.0)
Platelets: 319 10*3/uL (ref 150–400)
RBC: 3.68 MIL/uL — ABNORMAL LOW (ref 3.87–5.11)
RDW: 20.6 % — ABNORMAL HIGH (ref 11.5–15.5)
WBC: 27 10*3/uL — ABNORMAL HIGH (ref 4.0–10.5)
nRBC: 0.1 % (ref 0.0–0.2)

## 2022-06-30 LAB — HEPARIN ANTI-XA: Heparin LMW: 0.9 IU/mL

## 2022-06-30 LAB — MAGNESIUM: Magnesium: 1.7 mg/dL (ref 1.7–2.4)

## 2022-06-30 LAB — HEPARIN LEVEL (UNFRACTIONATED)
Heparin Unfractionated: 0.99 IU/mL — ABNORMAL HIGH (ref 0.30–0.70)
Heparin Unfractionated: 1.02 IU/mL — ABNORMAL HIGH (ref 0.30–0.70)

## 2022-06-30 LAB — HEMOGLOBIN AND HEMATOCRIT, BLOOD
HCT: 28.3 % — ABNORMAL LOW (ref 36.0–46.0)
HCT: 30.9 % — ABNORMAL LOW (ref 36.0–46.0)
HCT: 30.5 % — ABNORMAL LOW (ref 36.0–46.0)
HCT: 33 % — ABNORMAL LOW (ref 36.0–46.0)
Hemoglobin: 9.9 g/dL — ABNORMAL LOW (ref 12.0–15.0)
Hemoglobin: 10.3 g/dL — ABNORMAL LOW (ref 12.0–15.0)
Hemoglobin: 10.9 g/dL — ABNORMAL LOW (ref 12.0–15.0)
Hemoglobin: 11.5 g/dL — ABNORMAL LOW (ref 12.0–15.0)

## 2022-06-30 LAB — APTT
aPTT: 37 seconds — ABNORMAL HIGH (ref 24–36)
aPTT: 37 seconds — ABNORMAL HIGH (ref 24–36)
aPTT: 97 seconds — ABNORMAL HIGH (ref 24–36)

## 2022-06-30 LAB — NO BLOOD PRODUCTS

## 2022-06-30 LAB — PROTIME-INR
INR: 1.9 — ABNORMAL HIGH (ref 0.8–1.2)
Prothrombin Time: 21.5 seconds — ABNORMAL HIGH (ref 11.4–15.2)

## 2022-06-30 MED ORDER — CHLORHEXIDINE GLUCONATE CLOTH 2 % EX PADS
6.0000 | MEDICATED_PAD | Freq: Every day | CUTANEOUS | Status: DC
  Administered 2022-07-01: 6 via TOPICAL

## 2022-06-30 NOTE — Progress Notes (Addendum)
   NAME:  Joanne Carlson, MRN:  [id number], DOB:  August 05, 1945, LOS: 2 ADMISSION DATE:  06/28/2022, CONSULTATION DATE:  06/28/22 REFERRING MD:  Dr. Oswald Hillock CHIEF COMPLAINT:  Hypotension   History of Present Illness:  Joanne Carlson is a 76 year old woman with history of hypertension, HLD, ESRD on HD T/Th/S, PVD, DMII and metastatic bladder cancer who was recently admitted to the hospital 12/5 to 12/8 for chest pain found to have RLL segmental pulmonary emboli and RLE DVT discharged on eliquis. Since being home she has been doing ok until today where she was noted to have altered mentation per the family. She has not been eating or drinking much since being home due to decreased appetite. She is on midodrine 21m TID for low blood pressure.    She was brought in by EMS with initial BP 80/30. She was given 5081men route. She was unable to answer questions appropriately on arrival to ER. She was given another liter of fluid with MAPs in the low to mid 50s. She was started on periphral levophed. She is febrile to 102.39F. Blood cultures were drawn and she was given cefepime and vanc.    WBC count is 27k which has been elevated since 07/2021. Alk phos 247, Albumin <1.5, AST 236, ALT 63, T bili 1.3. Glucose 56 on arrival.    At time of interview patient was more alert and responsive. Daughter and son were at bedside. They agreed to albumin infusion but do not want blood transfusions. DNR remains in place as per prior palliative care notes.   Pertinent  Medical History  Metastatic Bladder Cancer ESRD on HD DMII  Significant Hospital Events: Including procedures, antibiotic start and stop dates in addition to other pertinent events   12/25 - AMS, hypotension, fever , peripheral levophed  Interim History / Subjective:  Remains confused on pressors.   Daughter at bedside.  Objective   Blood pressure (!) 106/42, pulse 100, temperature 98.1 F (36.7 C), temperature source Axillary, resp. rate 20,  height _0  (1.626 m), weight 62.4 kg, SpO2 98 %.        Intake/Output Summary (Last 24 hours) at 06/30/2022 0954 Last data filed at 06/30/2022 0700 Gross per 24 hour  Intake 1573.55 ml  Output 1000 ml  Net 573.55 ml    Filed Weights   06/29/22 0500 06/29/22 1900 06/30/22 0331  Weight: 61 kg 60 kg 62.4 kg    Examination: Pleasant but confused Moving all 4 ext Mumbling, not really making words Ext warm Abd soft Some rhonci on R to ausultation Heart sounds regular, +SEM  Cult neg so far WBC remains up  Resolved Hospital Problem list     Assessment & Plan:  Shock, presumed septic. Progressive FTT with underlying metastatic bladder cancer   ESRD on HD   Prior VTE   Septic encephalopathy, delirium  Dysphagia- family does not want NGT   - Continue abx, heparin, and pressors for now, levophed for MAP 65 - iHD for now - Family wants to wait for blood cult to be negative before reaching any final decisions RE: GOC - Appreciate PMT help  Best Practice (right click and "Reselect all SmartList Selections" daily)   Diet/type: NPO w/ oral meds DVT prophylaxis: systemic heparin GI prophylaxis: N/A Lines: N/A Foley:  N/A Code Status:  DNR Last date of multidisciplinary goals of care discussion [dMing.How  31 min cc time DaErskine EmeryD PCCM

## 2022-06-30 NOTE — Progress Notes (Signed)
ANTICOAGULATION CONSULT NOTE - Initial Consult  Pharmacy Consult for Heparin Indication: pulmonary embolus  Allergies  Allergen Reactions   Blood-Group Specific Substance    Tylenol [Acetaminophen] Rash    Patient Measurements: Height: '5\' 4"'$  (162.6 cm) Weight: 62.4 kg (137 lb 9.1 oz) IBW/kg (Calculated) : 54.7   Vital Signs: Temp: 98.5 F (36.9 C) (12/27 1646) Temp Source: Oral (12/27 1646) BP: 96/39 (12/27 1400) Pulse Rate: 98 (12/27 1400)  Labs: Recent Labs    06/28/22 1125 06/29/22 1601 06/29/22 1601 06/29/22 1800 06/30/22 0149 06/30/22 0548 06/30/22 1035 06/30/22 1536  HGB 10.8* 9.1*  --   --  9.9* 10.2* 10.3* 10.9*  HCT 32.6* 26.0*  --   --  28.3* 30.6* 30.9* 30.5*  PLT 324 310  --   --   --  319  --   --   APTT  --   --    < > 47* 37* 37*  --  97*  LABPROT  --   --   --   --  21.5*  --   --   --   INR  --   --   --   --  1.9*  --   --   --   HEPARINUNFRC  --   --   --  1.05*  --  0.99*  --  1.02*  HEPRLOWMOCWT  --   --   --   --  0.90  --   --   --   CREATININE 6.35* 5.07*  --   --   --  4.41*  --   --    < > = values in this interval not displayed.     Estimated Creatinine Clearance: 9.2 mL/min (A) (by C-G formula based on SCr of 4.41 mg/dL (H)).   Medical History: Past Medical History:  Diagnosis Date   Arthritis    "legs, knees; not bad" (01/30/2018)   Diabetic foot infection ([place]) 10/02/2021   Dizziness    occasionally   ESRD (end stage renal disease) ([place])    [name]; TTS" (01/30/2018)   History of colon polyps    History of gout    takes Allopurinol daily (01/30/2018)   Hyperlipidemia    takes Pravastatin daily   Hypertension    takes Monopril daily   Joint pain    PAD (peripheral artery disease) ([place])    Peripheral neuropathy    Peripheral vascular disease ([place])    Pneumonia    Pre-diabetes    Refusal of blood transfusions as patient is Jehovah's Witness    NO BLOOD OR BLOOD PRODUCTS. Albumin okay.    Stroke [facility]) 1990s   "mini  stroke; left lower mouth a little bit twisted since" (01/30/2018)    Assessment: 76 year old woman altered mentation per the family  Hx of hypertension, HLD, ESRD on HD T/Th/S, PVD, DMII and metastatic bladder cancer, RLL segmental pulmonary emboli and RLE DVT discharged on eliquis.  Last dose of eliquis on 12/23 per family. Pharmacy consulted to start heparin drip.  Will monitor aPTT due to lab interference of Eliquis.  Once HL and aPTT correlate, will change back to monitoring HL.  PTT came back therapeutic this PM at 97. Heparin was held last PM due to some blood in BM. We will try to be less aggressive here.   Goal of Therapy:  Heparin level: 0.3-0.5 aPTT 66-85 Monitor platelets by anticoagulation protocol: Yes   Plan:  Decrease heparin infusion to 1100 units/hr  Check anti-Xa level and aPTT in AM and daily while on heparin Continue to monitor H&H and platelets  Onnie Boer, PharmD, BCIDP, AAHIVP, CPP Infectious Disease Pharmacist 06/30/2022 4:47 PM

## 2022-06-30 NOTE — Progress Notes (Signed)
[facility] Progress Note Patient Name: AFRAH BURLISON DOB: Jul 17, 1945 MRN: [id number]   Date of Service  06/30/2022  HPI/Events of Note  H & H, and coagulation parameters results reviewed, baseline hemoglobin was stable.   eICU Interventions  Will review hemoglobin on 5 am CBC and if stable will consider resuming Heparin gtt.        Kerry Kass Prudencio Velazco 06/30/2022, 4:43 AM

## 2022-06-30 NOTE — Progress Notes (Signed)
   This pt Is currently enrolled into services with [facility]- a home based palliative care program provided by [facility]. We will follow her through this hospitalization and assist with d/c plan and coordination of care.   Webb Silversmith RN [phone]

## 2022-06-30 NOTE — Progress Notes (Addendum)
[facility] Progress Note  Subjective: pt seen in ICU. Awakens but doesn't respond verbally. Tracks okay.   Vitals:   06/30/22 0815 06/30/22 0900 06/30/22 1000 06/30/22 1143  BP: (!) 107/50 (!) 104/45 (!) 109/35   Pulse: (!) 103 (!) 101 (!) 101   Resp: '18 19 18   '$ Temp: 98.1 F (36.7 C)   98.1 F (36.7 C)  TempSrc: Axillary   Axillary  SpO2: 96% 98% 98%   Weight:      Height:        Exam: Gen not responding No rash, cyanosis or gangrene Sclera anicteric, throat not seen  No jvd or bruits Chest clear bilat to bases, no rales/ wheezing RRR no MRG Abd soft ntnd no mass or ascites +bs GU deferred MS no joint effusions or deformity Ext no LE or UE edema, no wounds or ulcers Neuro is mostly obtunded, not awakening to stimulation    RUE AVF+bruit      Home meds include - allopurinol, lipitor, sensipar, neurontin 100 bid, midodrine 5 tid, renavite, oxy IR prn, velphoro 500 tid ac, eliquis bid, prns   CT abd >> IMPRESSION: New pleural effusions and bibasilar atelectasis/consolidation. New perihepatic ascites. Stable pulmonary nodules, hepatic lesions, and pelvic adenopathy consistent with metastatic disease, presumably related to 10.8 cm mass in the region of the urinary bladder CXR 12/25- IMPRESSION: Central pulmonary vessels are prominent. Increased interstitial markings c/w possible mild interstitial edema or interstitial pneumonia.     OP HD: TTS G-O 3.5h  350/700  57.2kg  2/2.5 bath  Heparin 4000  RUE AVF - last HD 12/23, post wt 59.2kg - bp's low 80s- 100s  - hectorol 1 mcg IV tiw - mircera 200 mcg q2, last 12/21, due 07/08/22       Assessment/ Plan: Shock - high fevers, presumed sepsis. On pressors and IV abx. Progressive bladder cancer w/ FTT.  AMS - multifactorial Metastatic bladder cancer - pt declining H/o DVT/ PE - eliquis changed to IV heparin  ESRD - on HD TTS. BP's soft on pressor support. Poor CRRT candidate given overall debility and severe  comorbidities. Had HD here last night. Next HD tomorrow.  Volume - up 5kg by weights, UF as tol given shock Acute/ chronic hypotension - was on po midodrine 5 tid at home. On pressor support here.  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 Next esa due 1/4.  MBD ckd - CCa and phos in range. Albumin very low. Cont IV vdra, hold binders while not eating.  DNR   Joanne Carlson 06/30/2022, 1:51 PM   Recent Labs  Lab 06/28/22 1125 06/28/22 2153 06/29/22 1601 06/30/22 0149 06/30/22 0548 06/30/22 1035  HGB 10.8*  --  9.1*   < > 10.2* 10.3*  ALBUMIN <1.5*  --   --   --   --   --   CALCIUM 7.9*  --  8.0*  --  8.3*  --   PHOS  --  4.1  --   --   --   --   CREATININE 6.35*  --  5.07*  --  4.41*  --   K 4.9  --  4.0  --  4.2  --    < > = values in this interval not displayed.   No results for input(s): "IRON", "TIBC", "FERRITIN" in the last 168 hours. Inpatient medications:  acetaminophen (TYLENOL) oral liquid 160 mg/5 mL  650 mg Oral Once   Chlorhexidine Gluconate Cloth  6  each Topical Daily   Chlorhexidine Gluconate Cloth  6 each Topical Q0600   [START ON 07/01/2022] doxercalciferol  1 mcg Intravenous Q T,Th,Sa-HD   midodrine  10 mg Oral Q8H   pantoprazole (PROTONIX) IV  40 mg Intravenous Q12H    sodium chloride 10 mL/hr at 06/30/22 0700   azithromycin Stopped (06/29/22 1535)   cefTRIAXone (ROCEPHIN)  IV 2 g (06/30/22 0749)   heparin 1,150 Units/hr (06/30/22 0700)   norepinephrine (LEVOPHED) Adult infusion 7 mcg/min (06/30/22 0750)   docusate sodium, mouth rinse, polyethylene glycol

## 2022-06-30 NOTE — Progress Notes (Signed)
[facility] Progress Note Patient Name: Joanne Carlson DOB: [birth year] MRN: [id number]   Date of Service  06/30/2022  HPI/Events of Note  Repeat hemoglobin is 10.2 gm / dl.  eICU Interventions  Will resume Heparin gtt. And order Q 4 hourly H & H x 3.        Kerry Kass Anitta Tenny 06/30/2022, 6:45 AM

## 2022-06-30 NOTE — Progress Notes (Signed)
Daily Progress Note   Patient Name: Joanne Carlson       Date: 06/30/2022 DOB: 1945-12-14  Age: 76 y.o. MRN#: [id number] Attending Physician: Joanne Furbish, MD Primary Care Physician: Joanne Del, DO Admit Date: 06/28/2022  Reason for Consultation/Follow-up: Establishing goals of care  Subjective: Medical records reviewed including progress notes, labs, imaging. Patient assessed at the bedside.  She remains confused with little meaningful improvement overnight.  Discussed with RN.  Patient's daughter Joanne Carlson is at the bedside for our scheduled family meeting.  She then called her aunt to participate via phone as well.  Joanne Carlson is understandably emotional, sharing with me that her mother has not made any indication that she understands conversations and that she only got her to say her name one time after much prompting.   Introduced role of palliative medicine to patient's sister.  Reviewed patient's acute illness in detail including preliminary negative result of blood cultures, bloody bowel movement overnight while on heparin, extremely low albumin, and high risk of further complications and deterioration with likely no long-term benefit to patient's overall prognosis even if aggressive interventions are continued.  I reviewed my prior conversation with Joanne Carlson during the last hospitalization with patient's family, emphasizing that she was very clear she would not want to be in her current situation for a prolonged period.  Her values include being able to talk and laugh with loved ones and understand what is happening to her.  Patient's sister and daughter both are in agreement with this.  The option of comfort care was reviewed in detail, explaining that patient may not even be stable for  transfer to residential hospice facility given her requirement for pressors and potential for further decline.  They feel that comfort care is probably what she would want if she understood her current situation.  Joanne Carlson really wants to know the finalized results of cultures before making a final decision.  I informed her that this would likely be later this evening, given the time they were drawn.  Counseled that the results will likely remain negative and that patient's shock is likely due to her overall failure to thrive and disease burden from cancer.  She verbalizes her understanding.  Both patient's daughter and sister are worried about how her son Joanne Carlson will take this news.  They do  not think he will come to terms with the reality of the situation either way.  We discussed that Joanne Carlson is ultimately HCPOA and she was chosen by the patient specifically to show the strength to honor her wishes when this time came.  Encouraged family to prioritize patient's wishes above any individual opinions from others.  We discussed the option of a follow-up goals of care discussion tomorrow morning.  We discussed that it would likely be helpful if her son could be present to hear information directly.  Exact time of family meeting remains to be determined, as my colleague will be taking over tomorrow while I am off service.    I then received a call from [facility] giving permission for an update to patient's sister Joanne Carlson when she calls PMT.  Inform her that I would be happy to provide an update when call is received.  Questions and concerns addressed. PMT will continue to support holistically.   Length of Stay: 2  Physical Exam Vitals and nursing note reviewed.  Constitutional:      General: She is not in acute distress.    Appearance: She is ill-appearing.     Interventions: Nasal cannula in place.  Cardiovascular:     Rate and Rhythm: Normal rate.  Pulmonary:     Effort: Pulmonary effort is normal. No  respiratory distress.  Skin:    General: Skin is warm and dry.  Neurological:     Mental Status: She is disoriented and confused.  Psychiatric:        Cognition and Memory: Cognition is impaired.            Vital Signs: BP (!) 106/42 (BP Location: Left Arm)   Pulse 100   Temp 98.1 F (36.7 C) (Axillary)   Resp 20   Ht '5\' 4"'$  (1.626 m)   Wt 62.4 kg   SpO2 98%   BMI 23.61 kg/m  SpO2: SpO2: 98 % O2 Device: O2 Device: Room Air O2 Flow Rate: O2 Flow Rate (L/min): 1 L/min      Palliative Assessment/Data: 10%   Palliative Care Assessment & Plan   Patient Profile: 76 y.o. female  with past medical history of hypertension, HLD, ESRD on HD T/Th/S, PVD, DMII and metastatic bladder cancer  admitted on 06/28/2022 with altered mental status.    Patient was previously hospitalized from 12/5 to 12/20 with PE and RLE DVT.  She is familiar to this PA from prior hospitalization.  She is now admitted after being brought in with hypotension and started on Levophed.  She was also febrile and cultures were drawn with concern for sepsis.  Patient with chronic failure to thrive and hypovolemic shock. PMT has been consulted to assist with goals of care conversation.  Assessment: Goals of care discussion Shock, presumed septic End-stage renal disease on dialysis Acute septic encephalopathy, unchanged Metastatic bladder cancer Failure to thrive Severe protein calorie malnutrition  Recommendations/Plan: Continue DNR Continue full scope treatment for now Follow-up family meeting on 12/28 around 11 AM to discuss transition to comfort care if blood cultures remain negative and patient still has not improved Psychosocial and emotional support provided PMT will continue to follow and support   Prognosis:  < 2 weeks  Discharge Planning: To Be Determined  Care plan was discussed with RN, patient, patient's daughter, patient's sister, Joanne Carlson   Total time: I spent 65 minutes in the care of  the patient today in the above activities and documenting the encounter.  MDM high  Joanne Carlson Joanne Litter, PA-C  Palliative Medicine Team Team phone # [phone]  Thank you for allowing the Palliative Medicine Team to assist in the care of this patient. Please utilize secure chat with additional questions, if there is no response within 30 minutes please call the above phone number.  Palliative Medicine Team providers are available by phone from 7am to 7pm daily and can be reached through the team cell phone.  Should this patient require assistance outside of these hours, please call the patient's attending physician.

## 2022-07-01 DIAGNOSIS — Z515 Encounter for palliative care: Secondary | ICD-10-CM | POA: Diagnosis not present

## 2022-07-01 DIAGNOSIS — R6521 Severe sepsis with septic shock: Secondary | ICD-10-CM | POA: Diagnosis not present

## 2022-07-01 DIAGNOSIS — Z66 Do not resuscitate: Secondary | ICD-10-CM

## 2022-07-01 DIAGNOSIS — A419 Sepsis, unspecified organism: Secondary | ICD-10-CM | POA: Diagnosis not present

## 2022-07-01 DIAGNOSIS — Z7189 Other specified counseling: Secondary | ICD-10-CM | POA: Diagnosis not present

## 2022-07-01 DIAGNOSIS — Z789 Other specified health status: Secondary | ICD-10-CM

## 2022-07-01 LAB — CBC
HCT: 31.1 % — ABNORMAL LOW (ref 36.0–46.0)
Hemoglobin: 10.4 g/dL — ABNORMAL LOW (ref 12.0–15.0)
MCH: 27.4 pg (ref 26.0–34.0)
MCHC: 33.4 g/dL (ref 30.0–36.0)
MCV: 81.8 fL (ref 80.0–100.0)
Platelets: 310 10*3/uL (ref 150–400)
RBC: 3.8 MIL/uL — ABNORMAL LOW (ref 3.87–5.11)
RDW: 20.5 % — ABNORMAL HIGH (ref 11.5–15.5)
WBC: 29.2 10*3/uL — ABNORMAL HIGH (ref 4.0–10.5)
nRBC: 0 % (ref 0.0–0.2)

## 2022-07-01 LAB — BASIC METABOLIC PANEL
Anion gap: 18 — ABNORMAL HIGH (ref 5–15)
Anion gap: 19 — ABNORMAL HIGH (ref 5–15)
BUN: 31 mg/dL — ABNORMAL HIGH (ref 8–23)
BUN: 32 mg/dL — ABNORMAL HIGH (ref 8–23)
CO2: 21 mmol/L — ABNORMAL LOW (ref 22–32)
CO2: 22 mmol/L (ref 22–32)
Calcium: 8.6 mg/dL — ABNORMAL LOW (ref 8.9–10.3)
Calcium: 8.6 mg/dL — ABNORMAL LOW (ref 8.9–10.3)
Chloride: 93 mmol/L — ABNORMAL LOW (ref 98–111)
Chloride: 93 mmol/L — ABNORMAL LOW (ref 98–111)
Creatinine, Ser: 5.1 mg/dL — ABNORMAL HIGH (ref 0.44–1.00)
Creatinine, Ser: 5.28 mg/dL — ABNORMAL HIGH (ref 0.44–1.00)
GFR, Estimated: 8 mL/min — ABNORMAL LOW (ref >60)
GFR, Estimated: 8 mL/min — ABNORMAL LOW (ref >60)
Glucose, Bld: 106 mg/dL — ABNORMAL HIGH (ref 70–99)
Glucose, Bld: 175 mg/dL — ABNORMAL HIGH (ref 70–99)
Potassium: 5.8 mmol/L — ABNORMAL HIGH (ref 3.5–5.1)
Potassium: 4 mmol/L (ref 3.5–5.1)
Sodium: 132 mmol/L — ABNORMAL LOW (ref 135–145)
Sodium: 134 mmol/L — ABNORMAL LOW (ref 135–145)

## 2022-07-01 LAB — GLUCOSE, CAPILLARY
Glucose-Capillary: 114 mg/dL — ABNORMAL HIGH (ref 70–99)
Glucose-Capillary: 144 mg/dL — ABNORMAL HIGH (ref 70–99)

## 2022-07-01 LAB — POTASSIUM: Potassium: 3.5 mmol/L (ref 3.5–5.1)

## 2022-07-01 MED ORDER — ONDANSETRON 4 MG PO TBDP: 4.0000 mg | ORAL_TABLET | Freq: Four times a day (QID) | ORAL | Status: DC | PRN

## 2022-07-01 MED ORDER — SODIUM POLYSTYRENE SULFONATE 15 GM/60ML PO SUSP
45.0000 g | Freq: Once | ORAL | Status: DC
  Filled 2022-07-01 (×2): qty 180

## 2022-07-01 MED ORDER — GLYCOPYRROLATE 0.2 MG/ML IJ SOLN: 0.2000 mg | INTRAMUSCULAR | Status: DC | PRN

## 2022-07-01 MED ORDER — SODIUM CHLORIDE 0.9 % IV SOLN: INTRAVENOUS | Status: DC

## 2022-07-01 MED ORDER — HEPARIN SODIUM (PORCINE) 1000 UNIT/ML IJ SOLN
2000.0000 [IU] | INTRAMUSCULAR | Status: DC | PRN
  Administered 2022-07-01: 2000 [IU] via INTRAVENOUS

## 2022-07-01 MED ORDER — HALOPERIDOL LACTATE 5 MG/ML IJ SOLN: 2.0000 mg | Freq: Four times a day (QID) | INTRAMUSCULAR | Status: DC | PRN

## 2022-07-01 MED ORDER — LORAZEPAM 2 MG/ML PO CONC: 1.0000 mg | ORAL | Status: DC | PRN

## 2022-07-01 MED ORDER — HALOPERIDOL LACTATE 2 MG/ML PO CONC: 2.0000 mg | Freq: Four times a day (QID) | ORAL | Status: DC | PRN

## 2022-07-01 MED ORDER — LORAZEPAM 1 MG PO TABS: 1.0000 mg | ORAL_TABLET | ORAL | Status: DC | PRN

## 2022-07-01 MED ORDER — HEPARIN SODIUM (PORCINE) 1000 UNIT/ML IJ SOLN
INTRAMUSCULAR | Status: AC
  Filled 2022-07-01: qty 1

## 2022-07-01 MED ORDER — ALBUMIN HUMAN 25 % IV SOLN
25.0000 g | Freq: Once | INTRAVENOUS | Status: AC
  Administered 2022-07-01: 25 g via INTRAVENOUS

## 2022-07-01 MED ORDER — INSULIN ASPART 100 UNIT/ML IV SOLN
5.0000 [IU] | Freq: Once | INTRAVENOUS | Status: AC
  Administered 2022-07-01: 5 [IU] via INTRAVENOUS

## 2022-07-01 MED ORDER — MORPHINE SULFATE (PF) 2 MG/ML IV SOLN
2.0000 mg | INTRAVENOUS | Status: DC | PRN
  Administered 2022-07-01: 2 mg via INTRAVENOUS
  Filled 2022-07-01: qty 1

## 2022-07-01 MED ORDER — GLYCOPYRROLATE 1 MG PO TABS: 1.0000 mg | ORAL_TABLET | ORAL | Status: DC | PRN

## 2022-07-01 MED ORDER — LORAZEPAM 2 MG/ML IJ SOLN: 1.0000 mg | INTRAMUSCULAR | Status: DC | PRN

## 2022-07-01 MED ORDER — DIPHENHYDRAMINE HCL 50 MG/ML IJ SOLN: 12.5000 mg | INTRAMUSCULAR | Status: DC | PRN

## 2022-07-01 MED ORDER — HEPARIN SODIUM (PORCINE) 1000 UNIT/ML DIALYSIS: 2000.0000 [IU] | INTRAMUSCULAR | Status: DC | PRN

## 2022-07-01 MED ORDER — ONDANSETRON HCL 4 MG/2ML IJ SOLN: 4.0000 mg | Freq: Four times a day (QID) | INTRAMUSCULAR | Status: DC | PRN

## 2022-07-01 MED ORDER — MORPHINE SULFATE (PF) 2 MG/ML IV SOLN: 2.0000 mg | INTRAVENOUS | Status: DC | PRN

## 2022-07-01 MED ORDER — BIOTENE DRY MOUTH MT LIQD
15.0000 mL | Freq: Two times a day (BID) | OROMUCOSAL | Status: DC
  Administered 2022-07-01 – 2022-07-02 (×2): 15 mL via TOPICAL

## 2022-07-01 MED ORDER — DEXTROSE 50 % IV SOLN
1.0000 | Freq: Once | INTRAVENOUS | Status: AC
  Administered 2022-07-01: 50 mL via INTRAVENOUS
  Filled 2022-07-01: qty 50

## 2022-07-01 MED ORDER — POLYVINYL ALCOHOL 1.4 % OP SOLN
1.0000 [drp] | Freq: Four times a day (QID) | OPHTHALMIC | Status: DC | PRN
  Filled 2022-07-01: qty 15

## 2022-07-01 MED ORDER — HALOPERIDOL 1 MG PO TABS: 2.0000 mg | ORAL_TABLET | Freq: Four times a day (QID) | ORAL | Status: DC | PRN

## 2022-07-01 NOTE — Progress Notes (Signed)
NAME:  Joanne Carlson, MRN:  [id number], DOB:  Jun 27, 1946, LOS: 3 ADMISSION DATE:  06/28/2022, CONSULTATION DATE:  06/28/22 REFERRING MD:  Dr. Oswald Hillock CHIEF COMPLAINT:  Hypotension   History of Present Illness:  Joanne Carlson is a 76 year old woman with history of hypertension, HLD, ESRD on HD T/Th/S, PVD, DMII and metastatic bladder cancer who was recently admitted to the hospital 12/5 to 12/8 for chest pain found to have RLL segmental pulmonary emboli and RLE DVT discharged on eliquis. Since being home she has been doing ok until today where she was noted to have altered mentation per the family. She has not been eating or drinking much since being home due to decreased appetite. She is on midodrine 38m TID for low blood pressure.    She was brought in by EMS with initial BP 80/30. She was given 5044men route. She was unable to answer questions appropriately on arrival to ER. She was given another liter of fluid with MAPs in the low to mid 50s. She was started on periphral levophed. She is febrile to 102.49F. Blood cultures were drawn and she was given cefepime and vanc.    WBC count is 27k which has been elevated since 07/2021. Alk phos 247, Albumin <1.5, AST 236, ALT 63, T bili 1.3. Glucose 56 on arrival.    At time of interview patient was more alert and responsive. Daughter and son were at bedside. They agreed to albumin infusion but do not want blood transfusions. DNR remains in place as per prior palliative care notes.   Pertinent  Medical History  Metastatic Bladder Cancer ESRD on HD DMII  Significant Hospital Events: Including procedures, antibiotic start and stop dates in addition to other pertinent events   12/25 - AMS, hypotension, fever , peripheral levophed  Interim History / Subjective:  Worsening shock; Levo requirements increasing to 14 Not able to tolerate dialysis sessions due to tachycardia and hypotension Patient in NAD and denies any pain Mentation slightly  improved Daughter at bedside  Objective   Blood pressure (!) 110/99, pulse (!) 170, temperature 98.3 F (36.8 C), temperature source Oral, resp. rate (!) 21, height _0  (1.626 m), weight 62.1 kg, SpO2 99 %.        Intake/Output Summary (Last 24 hours) at 07/01/2022 1004 Last data filed at 07/01/2022 0600 Gross per 24 hour  Intake 1337.16 ml  Output 2 ml  Net 1335.16 ml    Filed Weights   06/30/22 0331 07/01/22 0200 07/01/22 0751  Weight: 62.4 kg 62.1 kg 62.1 kg    Examination: General:  elderly female in NAD HEENT: MM pink/moist; Johnson in place Neuro: mental status slowly improved; able to state name and follow some commands CV: s1s2, afib 140-180s, no m/r/g PULM:  dim clear BS bilaterally; [place] 2L GI: soft, bsx4 active  Extremities: warm/dry, no edema  Skin: no rashes or lesions appreciated  Bc negative    Resolved Hospital Problem list     Assessment & Plan:  Shock, presumed septic. Progressive FTT with underlying metastatic bladder cancer  ESRD on HD Prior VTE  Septic encephalopathy, delirium Dysphagia- family does not want NGT P: -Had lengthy discussion with daughter KaLonn Georgiat bedside with Aunt over phone. Since patient is continuing to have worsening shock and increasing pressor requirements family, daughter in agreement to transition to comfort care and transfer to hospice. TOC consult placed for hospice bed placement. Patient currently in no pain. Morphine prn ordered. Comfort orders placed. Patient  already DNR.    Best Practice (right click and "Reselect all SmartList Selections" daily)   Diet/type: NPO DVT prophylaxis: not indicated GI prophylaxis: N/A Lines: N/A Foley:  N/A Code Status:  DNR Last date of multidisciplinary goals of care discussion [12/28 see above; comfort care ]  Critical Care Time: 35 minutes  JD Rexene Agent [facility] 07/01/2022, 10:47 AM  Please see [email or web address] for pager details.  From 7A-7P if no  response, please call [phone]. After hours, please call ELink [phone].

## 2022-07-01 NOTE — Procedures (Signed)
Patient received dialysis in bed on M3 Unit room 10. Alert and disoriented, admitted to hospital with AMS.  Informed consent given verbally over the phone with daughter, Rakia Frayne, to this nurse and Isaiah Blakes, RN. signed and in chart.   Treatment initiated: 0828 Treatment completed: 1008  Patient tolerated first 45 minutes of treatment. Started having episodes of Ventricular Tachycardia. ICU nurse informed. Patient was at max dose of Levophed IV peripheral. BP started to decrease SBP < 90. UF stopped . Urologist Roney Jaffe contacted. Verbal orders to decrease treatment time to 3 hours, give 25 grams of albumin, and give 100 ml boluses to support BP with a max totaling 500 mL. Per MD if SBP is greater than 85 then continue with treatment.  Albumin given and documented in [facility]. Total of 300 ml given to patient. BP 88/44 (58).  Critical care team entered room and asked HD nurse to stop treatment due to patient being unstable. Patient has been alert but not oriented. Patient denied pain, chest pain, dizziness and nausea during treatment and after treatment. Per request of critical care team/resident treatment was ended. Daughter at bedside informed that comfort care may be the only option.   Patient alert and at baseline without acute distress.  Hand-off given to patient's nurse, Leola Brazil RN.   Access used: [place]  Access issues: No issues   Total UF removed: 0  Post HD VS:  Temp: 98 BP: 117/107 Heart Rate: 157 A fib/V tach RR: 19 Oxygen sat: 99 3L [place]  Post HD weight: 62.1 kg   Kimber [facility]

## 2022-07-01 NOTE — Procedures (Signed)
Spoke with daughter, Gianah Batt [phone]), to acquire consent for Hemodialysis today due to patient being disoriented.

## 2022-07-01 NOTE — Progress Notes (Signed)
Manufacturing engineer [facility]) Hospital Liaison Note  Referral received for patient/family interest in [facility]. Chart under review by [facility] physician.   Hospice eligibility pending.   Unfortunately, [facility] is unable to offer a bed today. Hospital Liaison will follow up tomorrow or sooner if a room becomes available.   Please call with any questions or concerns. Thank you  Roselee Nova, [facility] Liaison [phone]

## 2022-07-01 NOTE — Progress Notes (Addendum)
Daily Progress Note   Patient Name: Joanne Carlson       Date: 07/01/2022 DOB: 10-Apr-1946  Age: 76 y.o. MRN#: [id number] Attending Physician: Candee Furbish, MD Primary Care Physician: Lurline Del, DO Admit Date: 06/28/2022  Reason for Consultation/Follow-up: Establishing goals of care  Subjective: I have reviewed medical records including EPIC notes and labs. Noted patient did not tolerate HD well this morning and after discussions with attending, HCPOA has opted for her transition to full comfort measures. Received report from primary RN - no acute concerns. RN reports patient has been hypotensive now on vasopressors.   Went to visit patient at bedside - daughter/[name] present. Patient was lying in bed awake, alert, pleasantly confused, unable to make complex medical decisions. She is able to answer simple questions - denies pain. No signs or non-verbal gestures of pain or discomfort noted. No respiratory distress, increased work of breathing, or secretions noted. She is on 2L O2 [place].  Emotional support provided to [employer] - she confirms goal for full comfort measures and no further HD. She is interested in hospice transfer, requesting [facility]. She questions when vasopressors will be stopped - recommendation given for stopping to allow nature to take it's course. [name] states there are family on their way to see patient and she worries patient will pass prior to their arrival - she wishes to continue this intervention until they can arrive later. She understands goal is not to titrate up in the event of a decline. Encouraged family/friends to come sooner than later.   All questions and concerns addressed. Encouraged to call with questions and/or concerns. PMT card provided.  Discussed with  RN [name]'s request to continue levo until family can arrive.   Per hospice liaison, no beds available today at [facility]  Later received notification from primary RN that son/Ortez was at bedside with questions. Per RN, he seemed visibly upset.  Returned to bedside - then met with patient's son in [facility] conference room. He expresses anger and frustration. Reviewed that despite aggressive interventions, patient has continued with worsening shock and increasing vasopressor requirements as she did not tolerate HD this morning. We reviewed that blood cultures have continued to preliminarily show no growth for three days and that her decline is likely the progression of her cancer. He expresses that he "  hates cancer." Validated thoughts and feelings. He understands that oncology has already told them patient is not a candidate for further disease modifying treatments - validated that patient is too weak/frail to undergo these aggressive treatments. We discussed his sister's decision for transition to comfort care - focusing on patient's quality of life and dignity during her last days - and how medical team supports her decisions.  He states "I don't know what to do" while understandably tearful. Encouraged him to take space and time he needs to process information as well as provide therapeutic presence with patient/family if desired. He states that he "has phone calls to make." Also discussed with him that if there is anyone that would want to come visit, recommendation is they come sooner than later - he expressed understanding. He expressed appreciation for PMT assistance today.   All questions and concerns addressed. Encouraged to call with questions and/or concerns. PMT card provided.  Length of Stay: 3  Current Medications: Scheduled Meds:   acetaminophen (TYLENOL) oral liquid 160 mg/5 mL  650 mg Oral Once   Chlorhexidine Gluconate Cloth  6 each Topical Daily   Chlorhexidine Gluconate Cloth  6  each Topical Q0600   Chlorhexidine Gluconate Cloth  6 each Topical Q0600   doxercalciferol  1 mcg Intravenous Q T,Th,Sa-HD   heparin sodium (porcine)       pantoprazole (PROTONIX) IV  40 mg Intravenous Q12H    Continuous Infusions:  sodium chloride 10 mL/hr at 07/01/22 0600   sodium chloride      PRN Meds: glycopyrrolate **OR** glycopyrrolate **OR** glycopyrrolate, heparin sodium (porcine), heparin sodium (porcine), morphine injection, mouth rinse, polyvinyl alcohol  Physical Exam Vitals and nursing note reviewed.  Constitutional:      General: She is not in acute distress.    Appearance: She is ill-appearing.  Pulmonary:     Effort: No respiratory distress.  Skin:    General: Skin is warm and dry.  Neurological:     Mental Status: She is alert. She is disoriented and confused.     Motor: Weakness present.  Psychiatric:        Behavior: Behavior is cooperative.        Cognition and Memory: Cognition is impaired. Memory is impaired.        Judgment: Judgment is impulsive.             Vital Signs: BP (!) 139/122   Pulse (!) 148   Temp 98 F (36.7 C) (Oral)   Resp (!) 21   Ht _0  (1.626 m)   Wt 62.1 kg   SpO2 99%   BMI 23.50 kg/m  SpO2: SpO2: 99 % O2 Device: O2 Device: Room Air O2 Flow Rate: O2 Flow Rate (L/min): 2 L/min  Intake/output summary:  Intake/Output Summary (Last 24 hours) at 07/01/2022 1107 Last data filed at 07/01/2022 1014 Gross per 24 hour  Intake 1337.16 ml  Output 1 ml  Net 1336.16 ml   LBM: Last BM Date : 06/30/22 Baseline Weight: Weight: 61 kg Most recent weight: Weight: 62.1 kg       Palliative Assessment/Data: PPS 10%      Patient Active Problem List   Diagnosis Date Noted   Septic shock ([place]) 06/28/2022   Pulmonary embolism ([place]) 06/08/2022   Pulmonary embolus ([place]) 06/07/2022   DVT (deep venous thrombosis) ([place]) 06/07/2022   Leukocytosis 06/07/2022   Transient hypotension 06/07/2022   Diabetic foot infection ([place])  10/02/2021   Cellulitis    Medication  monitoring encounter    Osteomyelitis ([place]) 08/29/2021   PVD (peripheral vascular disease) ([place]) 08/29/2021   Bladder cancer ([place]) 07/22/2021   Aortic stenosis 04/08/2021   Lumbar radiculopathy 10/15/2020   Lumbar pain 10/15/2020   Hypercalcemia 04/25/2020   Claudication in peripheral vascular disease ([place]) 03/03/2020   Closed fracture of base of proximal phalanx of finger 01/23/2020   Right wrist pain 01/09/2020   Pain of left hand 12/24/2019   Allergy, unspecified, initial encounter 02/05/2019   Anaphylactic shock, unspecified, initial encounter 02/05/2019   Encounter for removal of sutures 07/15/2018   Iron deficiency anemia, unspecified 02/27/2018   Atherosclerosis of native arteries of the extremities with gangrene ([place]) 11/23/2017   Critical lower limb ischemia ([place]) 01/07/2017   Shortness of breath 12/04/2016   Unspecified protein-calorie malnutrition ([place]) 12/02/2016   Encounter for immunization 11/22/2016   Dependence on renal dialysis ([place]) 11/16/2016   Coagulation defect, unspecified ([place]) 10/19/2016   Dorsalgia, unspecified 10/12/2016   Gout, unspecified 10/12/2016   Hyperkalemia 10/12/2016   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 stage renal disease ([place]) 10/12/2016   Morbid (severe) obesity due to excess calories ([place]) 10/12/2016   Secondary hyperparathyroidism of renal origin ([place]) 10/12/2016   Chronic renal insufficiency, stage IV (severe) ([place]) 11/13/2013   End stage renal disease ([place]) 07/20/2013   Mechanical complication of other vascular device, implant, and graft 07/20/2013   Other specified disorders of kidney and ureter 11/30/2011   History of recurrent transient ischemic attacks 09/27/2011   HYPERCHOLESTEROLEMIA 08/07/2008   ANEMIA 08/07/2008   RENAL INSUFFICIENCY 08/07/2008   ALLERGIC RHINITIS 03/04/2008   Type 2 diabetes mellitus without complication, with long-term current  use of insulin ([place]) 03/15/2007   HYPERTENSION, BENIGN 03/15/2007    Palliative Care Assessment & Plan   Patient Profile: 76 y.o. female  with past medical history of hypertension, HLD, ESRD on HD T/Th/S, PVD, DMII and metastatic bladder cancer  admitted on 06/28/2022 with altered mental status.    Patient was previously hospitalized from 12/5 to 12/20 with PE and RLE DVT.  She is familiar to this PA from prior hospitalization.  She is now admitted after being brought in with hypotension and started on Levophed.  She was also febrile and cultures were drawn with concern for sepsis.  Patient with chronic failure to thrive and hypovolemic shock. PMT has been consulted to assist with goals of care conversation.  Assessment: Principal Problem:   Septic shock ([place])   Terminal care  Recommendations/Plan: Continue full comfort measures No further HD Continue DNR/DNI as previously documented HCPOA/daughter requesting [facility] transfer - TOC consult placed; TOC and hospice liaison notified Daughter wishes to continue vasopressors until family can arrive later; then agreeable to discontinue Added orders for EOL symptom management and to reflect full comfort measures, as well as discontinued orders that were not focused on comfort Unrestricted visitation orders were placed per current [place] EOL visitation policy  Nursing to provide frequent assessments and administer PRN medications as clinically necessary to ensure EOL comfort PMT will continue to follow and support holistically   Symptom Management Morphine PRN pain/dyspnea/increased work of breathing/RR>25 Biotin twice daily Benadryl PRN itching Robinul PRN secretions Haldol PRN agitation/delirium Ativan PRN anxiety/seizure/sleep/distress Zofran PRN nausea/vomiting Liquifilm Tears PRN dry eye   Goals of Care and Additional Recommendations: Limitations on Scope of Treatment: Full Comfort Care  Code Status:     Code Status Orders  (From admission, onward)  Start     Ordered   07/01/22 1031  Do not attempt resuscitation (DNR)  Continuous       Question Answer Comment  If patient has no pulse and is not breathing Do Not Attempt Resuscitation   If patient has a pulse and/or is breathing: Medical Treatment Goals COMFORT MEASURES: Keep clean/warm/dry, use medication by any route; positioning, wound care and other measures to relieve pain/suffering; use oxygen, suction/manual treatment of airway obstruction for comfort; do not transfer unless for comfort needs.   Consent: Discussion documented in EHR or advanced directives reviewed      07/01/22 1032           Code Status History     Date Active Date Inactive Code Status Order ID Comments User Context   06/28/2022 1853 07/01/2022 1032 DNR [id number]  Freddi Starr, MD ED   06/07/2022 1803 06/11/2022 1644 DNR [id number]  Norberta Keens, PA-C ED   06/07/2022 1309 06/07/2022 1803 Full Code [id number]  Norval Morton, MD ED   08/29/2021 1950 09/05/2021 0205 Full Code [id number]  Orene Desanctis, DO ED   07/22/2021 1459 07/23/2021 1948 Full Code [id number]  Vira Agar, MD Inpatient   03/03/2020 1711 03/05/2020 0250 Full Code [id number]  Lorretta Harp, MD Inpatient   01/30/2018 0936 01/31/2018 1528 Full Code [id number]  Lorretta Harp, MD Inpatient       Prognosis:  Hours - Days  Discharge Planning: Hospice facility  Care plan was discussed with primary RN, Dr. Tamala Julian, patient's daughter and son, [facility], hospice liaison  Thank you for allowing the Palliative Medicine Team to assist in the care of this patient.   Total Time 70 minutes Prolonged Time Billed  yes       Greater than 50%  of this time was spent counseling and coordinating care related to the above assessment and plan.  Lin Landsman, NP  Please contact Palliative Medicine Team phone at [phone] for questions and concerns.   *Portions of this note are a  verbal dictation therefore any spelling and/or grammatical errors are due to the "[employer] One" system interpretation.

## 2022-07-01 NOTE — Progress Notes (Signed)
Pt is now transitioning to comfort care. Did not tolerate HD today. No further dialysis, will sign off.   Kelly Splinter, MD 07/01/2022, 2:11 PM

## 2022-07-01 NOTE — Progress Notes (Signed)
[facility] Progress Note Patient Name: Joanne Carlson DOB: [birth year] MRN: [id number]   Date of Service  07/01/2022  HPI/Events of Note  K+ 5.8, patient is NPO.  eICU Interventions  Kayexalate 45 gm rectally x 1, D 50 + 5 units of Insulin ordered.        Christene Pounds U Jeevan Kalla 07/01/2022, 5:00 AM

## 2022-07-02 DIAGNOSIS — Z515 Encounter for palliative care: Secondary | ICD-10-CM | POA: Diagnosis not present

## 2022-07-02 DIAGNOSIS — Z7189 Other specified counseling: Secondary | ICD-10-CM | POA: Diagnosis not present

## 2022-07-02 DIAGNOSIS — Z66 Do not resuscitate: Secondary | ICD-10-CM | POA: Diagnosis not present

## 2022-07-02 DIAGNOSIS — A419 Sepsis, unspecified organism: Principal | ICD-10-CM

## 2022-07-02 DIAGNOSIS — R6521 Severe sepsis with septic shock: Secondary | ICD-10-CM

## 2022-07-02 MED ORDER — LORAZEPAM 1 MG PO TABS: 1.0000 mg | ORAL_TABLET | ORAL | 0 refills | Status: AC | PRN

## 2022-07-02 MED ORDER — HALOPERIDOL 2 MG PO TABS: 2.0000 mg | ORAL_TABLET | Freq: Four times a day (QID) | ORAL | 0 refills | Status: AC | PRN

## 2022-07-02 NOTE — Care Management Important Message (Signed)
Important Message  Patient Details  Name: Joanne Carlson MRN: [id number] Date of Birth: 22-Dec-1945   Medicare Important Message Given:  Other (see comment)     Hannah Beat 07/02/2022, 4:24 PM

## 2022-07-02 NOTE — Progress Notes (Signed)
12/29 Pt under Droplet Precaution, Spoke to daughter [name]) via telephone and explained the IMM Letter and she verbally confirmed. Letter will be mailed to address on file.

## 2022-07-02 NOTE — Progress Notes (Signed)
Daily Progress Note   Patient Name: Joanne Carlson       Date: 07/02/2022 DOB: 31-May-1946  Age: 76 y.o. MRN#: [id number] Attending Physician: Elmarie Shiley, MD Primary Care Physician: Lurline Del, DO Admit Date: 06/28/2022  Reason for Consultation/Follow-up: Non pain symptom management, Pain control, Psychosocial/spiritual support, and Terminal Care  Subjective: I have reviewed medical records including EPIC notes and labs. Received report from primary RN - no acute concerns.  Went to visit patient at bedside - daughter/[name] present. Patient was lying in bed intermittently awake/asleep. She does not talk today. No signs or non-verbal gestures of pain or discomfort noted. No respiratory distress, increased work of breathing, or secretions noted.   Emotional support provided to [facility]. Therapeutic listening provided as she describes "wondering if I made the right decision" yesterday because patient was "more awake"; however, after noting her decline into today with increased lethargy, she does feel at peace with her decision for comfort care. Validated that comfort care was an appropriate and hard but loving decision to make for patient. [name] tells me her brother was more accepting of patient's situation after our discussion yesterday; however, struggling to accept she will no longer receive dialysis treatments. Offered to speak with him again today if needed - she expressed appreciation. She tells me that [facility] notified her yesterday they did not have any beds available; she asks if [facility] has beds - informed her I was not sure but could reach out to their liaison for more information. She would like to accept the first available bed at [facility] or  [facility].   Chaplain support offered - she respectfully declines.  All questions and concerns addressed. Encouraged to call with questions and/or concerns. PMT card previously provided.  Discussed case with TOC as well as [facility] and [facility] liaisons - both have beds available. As family's first choice was [place] will do primary evaluation.  Noted patient was accepted to [facility].  Length of Stay: 4  Current Medications: Scheduled Meds:   antiseptic oral rinse  15 mL Topical BID   Chlorhexidine Gluconate Cloth  6 each Topical Q0600    Continuous Infusions:   PRN Meds: diphenhydrAMINE, glycopyrrolate **OR** glycopyrrolate **OR** glycopyrrolate, haloperidol **OR** haloperidol **OR** haloperidol lactate, LORazepam **OR**  LORazepam **OR** LORazepam, morphine injection, ondansetron **OR** ondansetron (ZOFRAN) IV, polyvinyl alcohol  Physical Exam Vitals and nursing note reviewed.  Constitutional:      General: She is not in acute distress.    Appearance: She is ill-appearing.  Pulmonary:     Effort: No respiratory distress.  Skin:    General: Skin is warm and dry.  Neurological:     Mental Status: She is lethargic.     Motor: Weakness present.             Vital Signs: BP (!) 84/42 (BP Location: Left Arm)   Pulse 95   Temp (!) 97.5 F (36.4 C)   Resp 16   Ht '5\' 4"'$  (1.626 m)   Wt 62.1 kg   SpO2 99%   BMI 23.50 kg/m  SpO2: SpO2: 99 % O2 Device: O2 Device: Nasal Cannula O2 Flow Rate: O2 Flow Rate (L/min): 2 L/min  Intake/output summary:  Intake/Output Summary (Last 24 hours) at 07/02/2022 1056 Last data filed at 07/01/2022 1600 Gross per 24 hour  Intake 222.65 ml  Output --  Net 222.65 ml   LBM: Last BM Date : 06/30/22 Baseline Weight: Weight: 61 kg Most recent weight: Weight: 62.1 kg       Palliative Assessment/Data: PPS 10%      Patient Active Problem List   Diagnosis Date Noted   Septic shock ([place]) 06/28/2022    Pulmonary embolism ([place]) 06/08/2022   Pulmonary embolus ([place]) 06/07/2022   DVT (deep venous thrombosis) ([place]) 06/07/2022   Leukocytosis 06/07/2022   Transient hypotension 06/07/2022   Diabetic foot infection ([place]) 10/02/2021   Cellulitis    Medication monitoring encounter    Osteomyelitis ([place]) 08/29/2021   PVD (peripheral vascular disease) ([place]) 08/29/2021   Bladder cancer ([place]) 07/22/2021   Aortic stenosis 04/08/2021   Lumbar radiculopathy 10/15/2020   Lumbar pain 10/15/2020   Hypercalcemia 04/25/2020   Claudication in peripheral vascular disease ([place]) 03/03/2020   Closed fracture of base of proximal phalanx of finger 01/23/2020   Right wrist pain 01/09/2020   Pain of left hand 12/24/2019   Allergy, unspecified, initial encounter 02/05/2019   Anaphylactic shock, unspecified, initial encounter 02/05/2019   Encounter for removal of sutures 07/15/2018   Iron deficiency anemia, unspecified 02/27/2018   Atherosclerosis of native arteries of the extremities with gangrene ([place]) 11/23/2017   Critical lower limb ischemia ([place]) 01/07/2017   Shortness of breath 12/04/2016   Unspecified protein-calorie malnutrition ([place]) 12/02/2016   Encounter for immunization 11/22/2016   Dependence on renal dialysis ([place]) 11/16/2016   Coagulation defect, unspecified ([place]) 10/19/2016   Dorsalgia, unspecified 10/12/2016   Gout, unspecified 10/12/2016   Hyperkalemia 10/12/2016   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 stage renal disease ([place]) 10/12/2016   Morbid (severe) obesity due to excess calories ([place]) 10/12/2016   Secondary hyperparathyroidism of renal origin ([place]) 10/12/2016   Chronic renal insufficiency, stage IV (severe) (HCC) 11/13/2013   End stage renal disease ([place]) 07/20/2013   Mechanical complication of other vascular device, implant, and graft 07/20/2013   Other specified disorders of kidney and ureter 11/30/2011   History of recurrent transient  ischemic attacks 09/27/2011   HYPERCHOLESTEROLEMIA 08/07/2008   ANEMIA 08/07/2008   RENAL INSUFFICIENCY 08/07/2008   ALLERGIC RHINITIS 03/04/2008   Type 2 diabetes mellitus without complication, with long-term current use of insulin ([place]) 03/15/2007   HYPERTENSION, BENIGN 03/15/2007    Palliative Care Assessment & Plan   Patient Profile: 76 y.o.  female  with past medical history of hypertension, HLD, ESRD on HD T/Th/S, PVD, DMII and metastatic bladder cancer  admitted on 06/28/2022 with altered mental status.    Patient was previously hospitalized from 12/5 to 12/20 with PE and RLE DVT.  She is familiar to this PA from prior hospitalization.  She is now admitted after being brought in with hypotension and started on Levophed.  She was also febrile and cultures were drawn with concern for sepsis.  Patient with chronic failure to thrive and hypovolemic shock. PMT has been consulted to assist with goals of care conversation.  Assessment: Principal Problem:   Septic shock ([place])   Terminal care  Recommendations/Plan: Continue full comfort measures No further HD Continue DNR/DNI as previously documented - durable DNR form completed and placed in shadow chart. Copy was made and will be scanned into Vynca/ACP tab Transfer to [facility] today Continue current comfort focused medication regimen - no changes today Nursing to provide frequent assessments and administer PRN medications as clinically necessary to ensure EOL comfort  PMT will continue to follow and support holistically  Symptom Management Morphine PRN pain/dyspnea/increased work of breathing/RR>25 Biotin twice daily Benadryl PRN itching Robinul PRN secretions Haldol PRN agitation/delirium Ativan PRN anxiety/seizure/sleep/distress Zofran PRN nausea/vomiting Liquifilm Tears PRN dry eye  Goals of Care and Additional Recommendations: Limitations on Scope of Treatment: Full Comfort Care  Code Status:    Code Status Orders   (From admission, onward)           Start     Ordered   07/01/22 1131  Do not attempt resuscitation (DNR)  Continuous       Question Answer Comment  If patient has no pulse and is not breathing Do Not Attempt Resuscitation   If patient has a pulse and/or is breathing: Medical Treatment Goals COMFORT MEASURES: Keep clean/warm/dry, use medication by any route; positioning, wound care and other measures to relieve pain/suffering; use oxygen, suction/manual treatment of airway obstruction for comfort; do not transfer unless for comfort needs.   Consent: Discussion documented in EHR or advanced directives reviewed      07/01/22 1131           Code Status History     Date Active Date Inactive Code Status Order ID Comments User Context   07/01/2022 1032 07/01/2022 1131 DNR [id number]  Mick Sell, PA-C Inpatient   06/28/2022 1853 07/01/2022 1032 DNR [id number]  Freddi Starr, MD ED   06/07/2022 1803 06/11/2022 1644 DNR [id number]  Norberta Keens, PA-C ED   06/07/2022 1309 06/07/2022 1803 Full Code [id number]  Norval Morton, MD ED   08/29/2021 1950 09/05/2021 0205 Full Code [id number]  Orene Desanctis, DO ED   07/22/2021 1459 07/23/2021 1948 Full Code [id number]  Vira Agar, MD Inpatient   03/03/2020 1711 03/05/2020 0250 Full Code [id number]  Lorretta Harp, MD Inpatient   01/30/2018 0936 01/31/2018 1528 Full Code [id number]  Lorretta Harp, MD Inpatient       Prognosis:  Days  Discharge Planning: Hospice facility  Care plan was discussed with primary RN, patient's daughter/HCPOA, Dr. Tyrell Antonio, [facility], hospice liaisons with [facility] and [facility]  Thank you for allowing the Palliative Medicine Team to assist in the care of this patient.  Lin Landsman, NP  Please contact Palliative Medicine Team phone at [phone] for questions and concerns.   *Portions of this note are a verbal dictation therefore any spelling and/or grammatical errors are due  to the  [employer] One" system interpretation.

## 2022-07-02 NOTE — Progress Notes (Signed)
Patient transfer to floor ,alert,no complain of pain,patient assessment done,peri care done,patient made comfortable in room,will continue to monitor.

## 2022-07-02 NOTE — TOC CM/SW Note (Signed)
[place] referral made by previous [facility] Team member . NCM will continue to follow

## 2022-07-02 NOTE — Progress Notes (Signed)
[place] and gave report to [facility] on patient transferring  to [facility],will continue to monitor.

## 2022-07-02 NOTE — Discharge Summary (Signed)
Physician Discharge Summary   Patient: Joanne Carlson MRN: [id number] DOB: [birth year]  Admit date:     06/28/2022  Discharge date: 07/02/22  Discharge Physician: Elmarie Shiley   PCP: Lurline Del, DO   Recommendations at discharge:   [place].  Transfer to [facility].   Discharge Diagnoses: Principal Problem:   Septic shock ([place])  Resolved Problems:   * No resolved hospital problems. *  Hospital Course: Joanne Carlson is a 76 year old woman with history of hypertension, HLD, ESRD on HD T/Th/S, PVD, DMII and metastatic bladder cancer who was recently admitted to the hospital 12/5 to 12/8 for chest pain found to have RLL segmental pulmonary emboli and RLE DVT discharged on eliquis. Since being home she has been doing ok until today where she was noted to have altered mentation per the family. She has not been eating or drinking much since being home due to decreased appetite. She is on midodrine 42m TID for low blood pressure.    She was brought in by EMS with initial BP 80/30. She was given 5042men route. She was unable to answer questions appropriately on arrival to ER. She was given another liter of fluid with MAPs in the low to mid 50s. She was started on periphral levophed. She is febrile to 102.49F. Blood cultures were drawn and she was given cefepime and vanc.    WBC count is 27k which has been elevated since 07/2021. Alk phos 247, Albumin <1.5, AST 236, ALT 63, T bili 1.3. Glucose 56 on arrival.    At time of interview patient was more alert and responsive. Daughter and son were at bedside. They agreed to albumin infusion but do not want blood transfusions. DNR remains in place as per prior palliative care notes.     Assessment and Plan: Shock, presumed septic. Progressive FTT with underlying metastatic bladder cancer  ESRD on HD Prior VTE  Septic encephalopathy, delirium Dysphagia- family does not want NGT -Despite aggressive treatment, with Antibiotics, fluids,  IV pressors, patient continue to decline and requirement more IV pressors. She was not able to tolerates HD with albumin and pressors.   -CCM had  lengthy discussion with daughter KaLonn Georgiat bedside with Aunt over phone. Since patient is continuing to have worsening shock and increasing pressor requirements family, daughter in agreement to transition to comfort care and transfer to hospice.   -Patient  has been transition to comfort care. Plan to transfer to [facility].         Consultants: CCM. Palliative Procedures performed: HD Disposition:  Residential Hospice.  Diet recommendation:  Discharge Diet Orders (From admission, onward)     Start     Ordered   07/02/22 0000  Diet - low sodium heart healthy        07/02/22 1237           Regular diet DISCHARGE MEDICATION: Allergies as of 07/02/2022       Reactions   Blood-group Specific Substance    Tylenol [acetaminophen] Rash        Medication List     STOP taking these medications    allopurinol 100 MG tablet Commonly known as: ZYLOPRIM   atorvastatin 80 MG tablet Commonly known as: LIPITOR   cinacalcet 30 MG tablet Commonly known as: SENSIPAR   Eliquis 5 MG Tabs tablet Generic drug: apixaban   gabapentin 100 MG capsule Commonly known as: NEURONTIN   lidocaine-prilocaine cream Commonly known as: EMLA   midodrine 5  MG tablet Commonly known as: PROAMATINE   multivitamin Tabs tablet   oxybutynin 5 MG tablet Commonly known as: DITROPAN   oxyCODONE 5 MG immediate release tablet Commonly known as: Roxicodone   Velphoro 500 MG chewable tablet Generic drug: sucroferric oxyhydroxide       TAKE these medications    haloperidol 2 MG tablet Commonly known as: HALDOL Take 1 tablet (2 mg total) by mouth every 6 (six) hours as needed for agitation (or delirium).   LORazepam 1 MG tablet Commonly known as: ATIVAN Take 1 tablet (1 mg total) by mouth every hour as needed for anxiety, seizure or sleep  (distress).        Discharge Exam: Filed Weights   07/01/22 0200 07/01/22 0751 07/01/22 1029  Weight: 62.1 kg 62.1 kg 62.1 kg   General; sleepy  Condition at discharge: poor  The results of significant diagnostics from this hospitalization (including imaging, microbiology, ancillary and laboratory) are listed below for reference.   Imaging Studies: CT ABDOMEN PELVIS WO CONTRAST  Result Date: 06/28/2022 CLINICAL DATA:  Sepsis EXAM: CT ABDOMEN AND PELVIS WITHOUT CONTRAST TECHNIQUE: Multidetector CT imaging of the abdomen and pelvis was performed following the standard protocol without IV contrast. RADIATION DOSE REDUCTION: This exam was performed according to the departmental dose-optimization program which includes automated exposure control, adjustment of the mA and/or kV according to patient size and/or use of iterative reconstruction technique. COMPARISON:  06/07/2022 FINDINGS: Lower chest: New small bilateral pleural effusions right greater than left. New consolidation/atelectasis posteriorly in the lung bases with air bronchograms, right worse than left. Bilateral pulmonary nodules, largest 1.5 cm in the superior segment left lower lobe (Im7,Se5) , stable since 06/07/2022. extensive coronary calcifications. Small pericardial effusion. Hepatobiliary: Distended gallbladder with some hyperdense material in its dependent aspect. Heterogenous liver parenchyma with multiple ill-defined nodular lesions as was seen previously. Pancreas: Unremarkable. No pancreatic ductal dilatation or surrounding inflammatory changes. Spleen: Normal in size without focal abnormality. Adrenals/Urinary Tract: Absent right kidney. Moderate parenchymal atrophy through the left kidney as before. Exophytic 2.9 cm 9 HU probable cyst from the mid left kidney as before; no follow-up indicated. There is mild hydronephrosis and proximal ureterectasis common ureter poorly identified distally. 10.8 cm mass in the region of the  urinary bladder as before. Stomach/Bowel: Stomach is nondistended, unremarkable. Small bowel decompressed. Colon is incompletely distended by gas and fecal material. Vascular/Lymphatic: Extensive aortoiliac calcified atheromatous plaque without suggestion of aneurysm. Stable 2.4 cm left external iliac adenopathy and 1.4 cm right external iliac adenopathy. Left para-aortic and aortocaval adenopathy is less well demonstrated on the current study compared to previous. Reproductive: Coarse uterine calcifications suggesting degenerated fibroids. No adnexal mass. Other: Trace perihepatic ascites, new since previous. No free air. No free air. Musculoskeletal: Stable mild T10 and T12 compression deformities. No acute findings. IMPRESSION: 1. New small bilateral pleural effusions and bibasilar atelectasis/consolidation. 2. New trace perihepatic ascites. 3. Stable pulmonary nodules, hepatic lesions, and pelvic adenopathy consistent with metastatic disease, presumably related to 10.8 cm mass in the region of the urinary bladder. 4. Stable mild left hydronephrosis and proximal ureterectasis. 5. Stable T10 and T12 compression deformities. 6. Distended gallbladder with hyperdense material in its dependent aspect, possibly sludge. 7.  Aortic Atherosclerosis (ICD10-I70.0). Electronically Signed   By: Lucrezia Europe M.D.   On: 06/28/2022 16:32   CT HEAD WO CONTRAST (5MM)  Result Date: 06/28/2022 CLINICAL DATA:  Head trauma, minor (Age >= 65y) EXAM: CT HEAD WITHOUT CONTRAST TECHNIQUE: Contiguous axial images were  obtained from the base of the skull through the vertex without intravenous contrast. RADIATION DOSE REDUCTION: This exam was performed according to the departmental dose-optimization program which includes automated exposure control, adjustment of the mA and/or kV according to patient size and/or use of iterative reconstruction technique. COMPARISON:  04/19/2022 FINDINGS: Brain: No evidence of acute infarction, hemorrhage,  hydrocephalus, extra-axial collection or mass lesion/mass effect. Scattered low-density changes within the periventricular and subcortical white matter compatible with chronic microvascular ischemic change. Mild diffuse cerebral volume loss. Vascular: Atherosclerotic calcifications involving the large vessels of the skull base. No unexpected hyperdense vessel. Skull: Normal. Negative for fracture or focal lesion. Sinuses/Orbits: No acute finding. Other: None. IMPRESSION: 1. No acute intracranial findings. 2. Chronic microvascular ischemic change and cerebral volume loss. Electronically Signed   By: Davina Poke D.O.   On: 06/28/2022 13:16   DG Chest Portable 1 View  Result Date: 06/28/2022 CLINICAL DATA:  Altered mental status EXAM: PORTABLE CHEST 1 VIEW COMPARISON:  Chest radiographs and CT done on 06/07/2022 FINDINGS: Transverse diameter of heart is increased. Central pulmonary vessels are more prominent. There is increase in interstitial markings in parahilar regions and lower lung fields. There is no focal pulmonary consolidation. Small linear densities are seen in the lower lung fields. There is blunting of right lateral costophrenic angle. There is no pneumothorax. IMPRESSION: Cardiomegaly. Central pulmonary vessels are prominent. Increased interstitial markings are seen in parahilar regions and lower lung fields suggesting possible mild interstitial edema or interstitial pneumonia. There is no focal pulmonary consolidation. Linear densities in the lower lung fields suggest subsegmental atelectasis. Small right pleural effusion is seen. Electronically Signed   By: Elmer Picker M.D.   On: 06/28/2022 11:26   ECHOCARDIOGRAM COMPLETE  Result Date: 06/07/2022    ECHOCARDIOGRAM REPORT   Patient Name:   PHILLIPPA STRAUB Date of Exam: 06/07/2022 Medical Rec #:  [id number]        Height:       64.0 in Accession #:    [id number]       Weight:       130.0 lb Date of Birth:  26-Oct-1945         BSA:           1.629 m Patient Age:    61 years         BP:           133/60 mmHg Patient Gender: F                HR:           95 bpm. Exam Location:  Inpatient Procedure: 2D Echo, Cardiac Doppler and Color Doppler Indications:    Pulmonary Embolus I26.09  History:        Patient has prior history of Echocardiogram examinations, most                 recent 11/17/2020. PAD and Stroke; Risk Factors:Dyslipidemia and                 Hypertension. End stage renal disease.  Sonographer:    Darlina Sicilian RDCS Referring Phys: Delbert Phenix SMITH IMPRESSIONS  1. Left ventricular ejection fraction, by estimation, is 65 to 70%. The left ventricle has normal function. The left ventricle has no regional wall motion abnormalities. Left ventricular diastolic parameters are indeterminate.  2. Right ventricular systolic function is normal. The right ventricular size is normal.  3. The mitral valve is normal in structure. Trivial mitral valve  regurgitation.  4. AV is thickened, calcified with restricted motion Peak and mean gradients through the valve are 29 and 14 mm Hg respectively AVA (VTI) is 1.21 cm2 Dimensionless index is 0.47 consistent wiht mild to moderate AS. Compared to echo from 2022, mean gradient across valve is mildly increased (11 to 14 mm HG). The aortic valve is tricuspid. Aortic valve regurgitation is not visualized.  5. The inferior vena cava is normal in size with greater than 50% respiratory variability, suggesting right atrial pressure of 3 mmHg. FINDINGS  Left Ventricle: Left ventricular ejection fraction, by estimation, is 65 to 70%. The left ventricle has normal function. The left ventricle has no regional wall motion abnormalities. The left ventricular internal cavity size was normal in size. There is  no left ventricular hypertrophy. Left ventricular diastolic parameters are indeterminate. Right Ventricle: The right ventricular size is normal. Right vetricular wall thickness was not assessed. Right ventricular systolic  function is normal. Left Atrium: Left atrial size was normal in size. Right Atrium: Right atrial size was normal in size. Pericardium: There is no evidence of pericardial effusion. Mitral Valve: The mitral valve is normal in structure. Trivial mitral valve regurgitation. Tricuspid Valve: The tricuspid valve is normal in structure. Tricuspid valve regurgitation is mild. Aortic Valve: AV is thickened, calcified with restricted motion Peak and mean gradients through the valve are 29 and 14 mm Hg respectively AVA (VTI) is 1.21 cm2 Dimensionless index is 0.47 consistent wiht mild to moderate AS. Compared to echo from 2022, mean gradient across valve is mildly increased (11 to 14 mm HG). The aortic valve is tricuspid. Aortic valve regurgitation is not visualized. Aortic valve mean gradient measures 13.5 mmHg. Aortic valve peak gradient measures 25.2 mmHg. Aortic valve area,  by VTI measures 1.21 cm. Pulmonic Valve: The pulmonic valve was normal in structure. Pulmonic valve regurgitation is not visualized. Aorta: The aortic root and ascending aorta are structurally normal, with no evidence of dilitation. Venous: The inferior vena cava is normal in size with greater than 50% respiratory variability, suggesting right atrial pressure of 3 mmHg. IAS/Shunts: No atrial level shunt detected by color flow Doppler.  LEFT VENTRICLE PLAX 2D LVIDd:         4.10 cm   Diastology LVIDs:         2.50 cm   LV e' medial:    8.06 cm/s LV PW:         0.60 cm   LV E/e' medial:  17.2 LV IVS:        0.70 cm   LV e' lateral:   8.70 cm/s LVOT diam:     1.80 cm   LV E/e' lateral: 16.0 LV SV:         68 LV SV Index:   42 LVOT Area:     2.54 cm  RIGHT VENTRICLE TAPSE (M-mode): 2.1 cm LEFT ATRIUM             Index        RIGHT ATRIUM           Index LA diam:        3.80 cm 2.33 cm/m   RA Area:     15.50 cm LA Vol (A2C):   44.9 ml 27.56 ml/m  RA Volume:   38.10 ml  23.39 ml/m LA Vol (A4C):   45.5 ml 27.93 ml/m LA Biplane Vol: 47.1 ml 28.91 ml/m   AORTIC VALVE AV Area (Vmax):    1.23 cm AV Area (  Vmean):   1.22 cm AV Area (VTI):     1.21 cm AV Vmax:           251.00 cm/s AV Vmean:          172.500 cm/s AV VTI:            0.563 m AV Peak Grad:      25.2 mmHg AV Mean Grad:      13.5 mmHg LVOT Vmax:         121.00 cm/s LVOT Vmean:        83.000 cm/s LVOT VTI:          0.267 m LVOT/AV VTI ratio: 0.47  AORTA Ao Root diam: 2.60 cm Ao Asc diam:  2.60 cm MITRAL VALVE                TRICUSPID VALVE MV Area (PHT): 3.97 cm     TR Peak grad:   33.2 mmHg MV Decel Time: 191 msec     TR Vmax:        288.00 cm/s MV E velocity: 139.00 cm/s MV A velocity: 126.00 cm/s  SHUNTS MV E/A ratio:  1.10         Systemic VTI:  0.27 m                             Systemic Diam: 1.80 cm Dorris Carnes MD Electronically signed by Dorris Carnes MD Signature Date/Time: 06/07/2022/4:42:06 PM    Final    VAS [place] LOWER EXTREMITY VENOUS (DVT)  Result Date: 06/07/2022  Lower Venous DVT Study Patient Name:  FREEDOM LOPEZPEREZ [place]  Date of Exam:   06/07/2022 Medical Rec #: [id number]         Accession #:    [id number] Date of Birth: Sep 09, 1945          Patient Gender: F Patient Age:   75 years Exam Location[facility]  [facility] Procedure:      VAS [place] LOWER EXTREMITY VENOUS (DVT) Referring Phys: Fuller Plan --------------------------------------------------------------------------------  Indications: Pulmonary embolism.  Risk Factors: Confirmed PE None identified. Anticoagulation: Heparin. Limitations: Poor ultrasound/tissue interface. Comparison Study: No prior studies. Performing Technologist: Oliver Hum RVT  Examination Guidelines: A complete evaluation includes B-mode imaging, spectral Doppler, color Doppler, and power Doppler as needed of all accessible portions of each vessel. Bilateral testing is considered an integral part of a complete examination. Limited examinations for reoccurring indications may be performed as noted. The reflux portion of the exam is performed with the patient in reverse  Trendelenburg.  +---------+---------------+---------+-----------+----------+--------------+ RIGHT    CompressibilityPhasicitySpontaneityPropertiesThrombus Aging +---------+---------------+---------+-----------+----------+--------------+ CFV      Partial        Yes      No                   Acute          +---------+---------------+---------+-----------+----------+--------------+ SFJ      Full                                                        +---------+---------------+---------+-----------+----------+--------------+ FV Prox  Full                                                        +---------+---------------+---------+-----------+----------+--------------+  FV Mid   Full                                                        +---------+---------------+---------+-----------+----------+--------------+ FV Distal               Yes      Yes                                 +---------+---------------+---------+-----------+----------+--------------+ PFV      Partial        Yes      No                   Acute          +---------+---------------+---------+-----------+----------+--------------+ POP      Full           Yes      No                                  +---------+---------------+---------+-----------+----------+--------------+ PTV      Full                                                        +---------+---------------+---------+-----------+----------+--------------+ PERO     Full                                                        +---------+---------------+---------+-----------+----------+--------------+ EIV                     Yes      No                                  +---------+---------------+---------+-----------+----------+--------------+ CIV                     Yes      No                                  +---------+---------------+---------+-----------+----------+--------------+    +---------+---------------+---------+-----------+----------+--------------+ LEFT     CompressibilityPhasicitySpontaneityPropertiesThrombus Aging +---------+---------------+---------+-----------+----------+--------------+ CFV      Full           Yes      No                                  +---------+---------------+---------+-----------+----------+--------------+ SFJ      Full                                                        +---------+---------------+---------+-----------+----------+--------------+  FV Prox  Full                                                        +---------+---------------+---------+-----------+----------+--------------+ FV Mid                  Yes      No                                  +---------+---------------+---------+-----------+----------+--------------+ FV DistalFull           Yes      No                                  +---------+---------------+---------+-----------+----------+--------------+ PFV      Full                                                        +---------+---------------+---------+-----------+----------+--------------+ POP      Full           Yes      No                                  +---------+---------------+---------+-----------+----------+--------------+ PTV      Full                                                        +---------+---------------+---------+-----------+----------+--------------+ PERO     Full                                                        +---------+---------------+---------+-----------+----------+--------------+     Summary: RIGHT: - Findings consistent with acute deep vein thrombosis involving the right common femoral vein, and right proximal profunda vein. - No cystic structure found in the popliteal fossa.  LEFT: - There is no evidence of deep vein thrombosis in the lower extremity.  - No cystic structure found in the popliteal fossa.  *See table(s) above  for measurements and observations. Electronically signed by Servando Snare MD on 06/07/2022 at 4:37:37 PM.    Final    CT ABDOMEN PELVIS W CONTRAST  Result Date: 06/07/2022 CLINICAL DATA:  Abdominal pain. History of bladder cancer. * Tracking Code: BO * EXAM: CT ABDOMEN AND PELVIS WITH CONTRAST TECHNIQUE: Multidetector CT imaging of the abdomen and pelvis was performed using the standard protocol following bolus administration of intravenous contrast. RADIATION DOSE REDUCTION: This exam was performed according to the departmental dose-optimization program which includes automated exposure control, adjustment of the mA and/or kV according to patient size and/or use of iterative reconstruction technique. CONTRAST:  75m OMNIPAQUE IOHEXOL 350 MG/ML SOLN COMPARISON:  01/13/2022 FINDINGS: Lower chest: Please see report for chest CTA performed at the same time and dictated separately. Hepatobiliary: Interval development of innumerable hypoenhancing liver lesions ranging in size from several mm up to about 13 mm maximum size. Imaging features compatible with metastatic disease. Gallbladder is distended with layering tiny stones or sludge towards the fundus. No intrahepatic or extrahepatic biliary dilation. Pancreas: No focal mass lesion. No dilatation of the main duct. No intraparenchymal cyst. No peripancreatic edema. Spleen: No splenomegaly. No focal mass lesion. Adrenals/Urinary Tract: No adrenal nodule or mass. Right kidney surgically absent. Stable 2.6 cm simple cyst interpolar left kidney with left moderate hydroureteronephrosis. Left ureter is dilated down to the UVJ region. As on the prior study, there is a large heterogeneously enhancing mass lesion occupying the location of the bladder. This was measured previously at 8.9 x 8.3 cm which compares 2 8.3 x 8.3 cm today when measured in a similar fashion. Stomach/Bowel: Stomach is unremarkable. No gastric wall thickening. No evidence of outlet obstruction. Duodenum  is normally positioned as is the ligament of Treitz. No small bowel wall thickening. No small bowel dilatation. The terminal ileum is normal. The appendix is not well visualized, but there is no edema or inflammation in the region of the cecum. No gross colonic mass. No colonic wall thickening. Vascular/Lymphatic: There is advanced atherosclerotic calcification of the abdominal aorta without aneurysm. There is no gastrohepatic or hepatoduodenal ligament lymphadenopathy. 10 mm short axis retrocaval lymph node on 35/3 is new in the interval. 10 mm short axis left para-aortic lymph node on 45/3 is new since prior. 15 mm short axis right external iliac node on 67/3 was 7 mm short axis previously. 16 mm short axis left external iliac node on 65/3 was 5-6 mm previously. Reproductive: There is no adnexal mass. Other: No substantial intraperitoneal free fluid. Musculoskeletal: Small umbilical hernia contains only fat. No worrisome lytic or sclerotic osseous abnormality. IMPRESSION: 1. Interval development of innumerable hypoenhancing liver lesions ranging in size from several mm up to about 13 mm maximum size. Imaging features compatible with metastatic disease. 2. Interval progression of retroperitoneal and bilateral external iliac lymphadenopathy, consistent with metastatic disease. 3. Similar appearance of a large heterogeneously enhancing mass lesion occupying the location of the bladder. Moderate left hydroureteronephrosis with left ureter dilated down to the UVJ region, similar to prior. 4. Status post right nephrectomy. 5. Small umbilical hernia contains only fat. 6.  Aortic Atherosclerosis (ICD10-I70.0). Electronically Signed   By: Misty Stanley M.D.   On: 06/07/2022 11:16   CT Angio Chest Pulmonary Embolism (PE) W or WO Contrast  Result Date: 06/07/2022 CLINICAL DATA:  PE suspected EXAM: CT ANGIOGRAPHY CHEST WITH CONTRAST TECHNIQUE: Multidetector CT imaging of the chest was performed using the standard protocol  during bolus administration of intravenous contrast. Multiplanar CT image reconstructions and MIPs were obtained to evaluate the vascular anatomy. RADIATION DOSE REDUCTION: This exam was performed according to the departmental dose-optimization program which includes automated exposure control, adjustment of the mA and/or kV according to patient size and/or use of iterative reconstruction technique. CONTRAST:  IV contrast was used to improve disease detection. COMPARISON:  CT AP 01/13/22 FINDINGS: Cardiovascular: Satisfactory opacification of the pulmonary arteries to the segmental level. There is an acute PE in a right lower lobe segmental pulmonary artery (series 6, image 146). RV/LV ratio >1. No pericardial effusion. Mediastinum/Nodes: No enlarged mediastinal, or axillary lymph nodes. There is a enlarged 1.6 cm right hilar lymph node (series 6, image 125).  Thyroid gland, trachea, and esophagus demonstrate no significant findings. Lungs/Pleura: No pleural effusion. No pneumothorax. Bibasilar atelectasis. 12Multiple bilateral pulmonary nodules, which are either new or enlarged compared to prior CT abdomen and pelvis dated 01/14/2019. Index nodules include a 4 mm nodule in the right upper lobe (series 6, image 67), a 6 mm nodule in the right upper lobe (series 6, image 96), a 1.2 cm perifissural right lower lobe pulmonary nodule (series 6, image 99), a 1.4 cm left lower lobe pulmonary nodule (series 6, image 133), a 1.3 cm right lower lobe pulmonary nodule (series 6, image 160). Upper Abdomen: The see separately dictated CT abdomen and pelvis for additional findings Musculoskeletal: There is a 1.9 x 1.9 cm subcutaneous hypodense lesion in the right axilla (series 6, image 25). Review of the MIP images confirms the above findings. IMPRESSION: 1. Acute PE in a right lower lobe segmental pulmonary artery. RV/LV ratio is greater than 1, which is suggestive of right heart strain. 2. 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 3. Enlarged right hilar lymph node, also concerning for metastatic disease. 4. Indeterminate 1.9 cm subcutaneous hypodense lesion in the right axilla, nonspecific. Recommend correlation with physical exam. 5. Please see same day CT AP for additional findings. Electronically Signed   By: Marin Roberts M.D.   On: 06/07/2022 11:16   DG Chest 2 View  Result Date: 06/07/2022 CLINICAL DATA:  Chest pain. EXAM: CHEST - 2 VIEW COMPARISON:  January 21, 2016 FINDINGS: The heart size and mediastinal contours are within normal limits. There is marked severity calcification of the aortic arch. Low lung volumes are noted. Mild atelectasis is seen within the bilateral lung bases. There is no evidence of focal consolidation, pleural effusion or pneumothorax. The visualized skeletal structures are unremarkable. IMPRESSION: Low lung volumes with mild bibasilar atelectasis. Electronically Signed   By: Virgina Norfolk M.D.   On: 06/07/2022 00:59    Microbiology: Results for orders placed or performed during the hospital encounter of 06/28/22  Blood culture (routine x 2)     Status: None (Preliminary result)   Collection Time: 06/28/22  9:53 AM   Specimen: BLOOD  Result Value Ref Range Status   Specimen Description BLOOD BLOOD LEFT FOREARM  Final   Special Requests   Final    BOTTLES DRAWN AEROBIC AND ANAEROBIC Blood Culture results may not be optimal due to an excessive volume of blood received in culture bottles   Culture   Final    NO GROWTH 4 DAYS Performed at [facility], [place] [address], [place], [place] [address]    Report Status PENDING  Incomplete  Resp panel by RT-PCR (RSV, Flu A&B, Covid) Anterior Nasal Swab     Status: None   Collection Time: 06/28/22 10:58 AM   Specimen: Anterior Nasal Swab  Result Value Ref Range Status   SARS Coronavirus 2 by RT PCR  NEGATIVE NEGATIVE Final    Comment: (NOTE) SARS-CoV-2 target nucleic acids are NOT DETECTED.  The SARS-CoV-2 RNA is generally detectable in upper respiratory specimens during the acute phase of infection. The lowest concentration of SARS-CoV-2 viral copies this assay can detect is 138 copies/mL. A negative result does not preclude SARS-Cov-2 infection and should not be used as the sole basis for treatment or other patient management decisions. A negative result may occur with  improper specimen collection/handling, submission of specimen other  than nasopharyngeal swab, presence of viral mutation(s) within the areas targeted by this assay, and inadequate number of viral copies(<138 copies/mL). A negative result must be combined with clinical observations, patient history, and epidemiological information. The expected result is Negative.  Fact Sheet for Patients:  EntrepreneurPulse.com.au  Fact Sheet for Healthcare Providers:  IncredibleEmployment.be  This test is no t yet approved or cleared by the [place] FDA and  has been authorized for detection and/or diagnosis of SARS-CoV-2 by FDA under an Emergency Use Authorization (EUA). This EUA will remain  in effect (meaning this test can be used) for the duration of the COVID-19 declaration under Section 564(b)(1) of the Act, 21 U.S.C.section 360bbb-3(b)(1), unless the authorization is terminated  or revoked sooner.       Influenza A by PCR NEGATIVE NEGATIVE Final   Influenza B by PCR NEGATIVE NEGATIVE Final    Comment: (NOTE) The Xpert Xpress SARS-CoV-2/FLU/RSV plus assay is intended as an aid in the diagnosis of influenza from Nasopharyngeal swab specimens and should not be used as a sole basis for treatment. Nasal washings and aspirates are unacceptable for Xpert Xpress SARS-CoV-2/FLU/RSV testing.  Fact Sheet for Patients: EntrepreneurPulse.com.au  Fact Sheet for  Healthcare Providers: IncredibleEmployment.be  This test is not yet approved or cleared by the [place] FDA and has been authorized for detection and/or diagnosis of SARS-CoV-2 by FDA under an Emergency Use Authorization (EUA). This EUA will remain in effect (meaning this test can be used) for the duration of the COVID-19 declaration under Section 564(b)(1) of the Act, 21 U.S.C. section 360bbb-3(b)(1), unless the authorization is terminated or revoked.     Resp Syncytial Virus by PCR NEGATIVE NEGATIVE Final    Comment: (NOTE) Fact Sheet for Patients: EntrepreneurPulse.com.au  Fact Sheet for Healthcare Providers: IncredibleEmployment.be  This test is not yet approved or cleared by the [place] FDA and has been authorized for detection and/or diagnosis of SARS-CoV-2 by FDA under an Emergency Use Authorization (EUA). This EUA will remain in effect (meaning this test can be used) for the duration of the COVID-19 declaration under Section 564(b)(1) of the Act, 21 U.S.C. section 360bbb-3(b)(1), unless the authorization is terminated or revoked.  Performed at [facility], [place] [address] [place], [place] [address]   Respiratory (~20 pathogens) panel by PCR     Status: None   Collection Time: 06/28/22 10:58 AM   Specimen: Nasopharyngeal Swab; Respiratory  Result Value Ref Range Status   Adenovirus NOT DETECTED NOT DETECTED Final   Coronavirus 229E NOT DETECTED NOT DETECTED Final    Comment: (NOTE) The Coronavirus on the Respiratory Panel, DOES NOT test for the novel  Coronavirus (2019 nCoV)    Coronavirus HKU1 NOT DETECTED NOT DETECTED Final   Coronavirus NL63 NOT DETECTED NOT DETECTED Final   Coronavirus OC43 NOT DETECTED NOT DETECTED Final   Metapneumovirus NOT DETECTED NOT DETECTED Final   Rhinovirus / Enterovirus NOT DETECTED NOT DETECTED Final   Influenza A NOT DETECTED NOT DETECTED Final   Influenza B  NOT DETECTED NOT DETECTED Final   Parainfluenza Virus 1 NOT DETECTED NOT DETECTED Final   Parainfluenza Virus 2 NOT DETECTED NOT DETECTED Final   Parainfluenza Virus 3 NOT DETECTED NOT DETECTED Final   Parainfluenza Virus 4 NOT DETECTED NOT DETECTED Final   Respiratory Syncytial Virus NOT DETECTED NOT DETECTED Final   Bordetella pertussis NOT DETECTED NOT DETECTED Final   Bordetella Parapertussis NOT DETECTED NOT DETECTED Final   Chlamydophila pneumoniae NOT DETECTED NOT  DETECTED Final   Mycoplasma pneumoniae NOT DETECTED NOT DETECTED Final    Comment: Performed at [facility], [place] [address], [place], [place] [address]  MRSA Next Gen by PCR, Nasal     Status: None   Collection Time: 06/28/22  8:21 PM   Specimen: Nasal Mucosa; Nasal Swab  Result Value Ref Range Status   MRSA by PCR Next Gen NOT DETECTED NOT DETECTED Final    Comment: (NOTE) The GeneXpert MRSA Assay (FDA approved for NASAL specimens only), is one component of a comprehensive MRSA colonization surveillance program. It is not intended to diagnose MRSA infection nor to guide or monitor treatment for MRSA infections. Test performance is not FDA approved in patients less than 16 years old. Performed at [facility], [place] [address], [place], [place] [address]   Blood culture (routine x 2)     Status: None (Preliminary result)   Collection Time: 06/28/22  9:53 PM   Specimen: BLOOD  Result Value Ref Range Status   Specimen Description BLOOD BLOOD RIGHT HAND  Final   Special Requests   Final    BOTTLES DRAWN AEROBIC AND ANAEROBIC Blood Culture adequate volume   Culture   Final    NO GROWTH 4 DAYS Performed at [facility], [place] [address] [place], [place] [address]    Report Status PENDING  Incomplete    Labs: CBC: Recent Labs  Lab 06/28/22 1125 06/29/22 1601 06/30/22 0149 06/30/22 0548 06/30/22 1035 06/30/22 1536 06/30/22 1856 07/01/22 0051  WBC 27.0* 29.1*  --  27.0*  --   --   --  29.2*   NEUTROABS 22.1*  --   --   --   --   --   --   --   HGB 10.8* 9.1*   < > 10.2* 10.3* 10.9* 11.5* 10.4*  HCT 32.6* 26.0*   < > 30.6* 30.9* 30.5* 33.0* 31.1*  MCV 83.8 82.5  --  83.2  --   --   --  81.8  PLT 324 310  --  319  --   --   --  310   < > = values in this interval not displayed.   Basic Metabolic Panel: Recent Labs  Lab 06/28/22 1125 06/28/22 2153 06/29/22 1601 06/30/22 0548 07/01/22 0051 07/01/22 0559 07/01/22 0914  NA 136  --  137 134* 132* 134*  --   K 4.9  --  4.0 4.2 5.8* 4.0 3.5  CL 97*  --  97* 95* 93* 93*  --   CO2 27  --  26 22 21* 22  --   GLUCOSE 56*  --  89 81 106* 175*  --   BUN 34*  --  31* 20 31* 32*  --   CREATININE 6.35*  --  5.07* 4.41* 5.10* 5.28*  --   CALCIUM 7.9*  --  8.0* 8.3* 8.6* 8.6*  --   MG  --  2.1  --  1.7  --   --   --   PHOS  --  4.1  --   --   --   --   --    Liver Function Tests: Recent Labs  Lab 06/28/22 1125  AST 236*  ALT 63*  ALKPHOS 247*  BILITOT 1.3*  PROT 6.3*  ALBUMIN <1.5*   CBG: Recent Labs  Lab 06/30/22 1644 06/30/22 1958 06/30/22 2315 07/01/22 0320 07/01/22 0809  GLUCAP 102* 108* 118* 114* 144*  Discharge time spent: greater than 30 minutes.  Signed: Elmarie Shiley, MD [facility] 07/02/2022

## 2022-07-02 NOTE — TOC Transition Note (Signed)
Transition of Care [facility]) - CM/SW Discharge Note   Patient Details  Name: Joanne Carlson MRN: [id number] Date of Birth: 23-Jan-1946  Transition of Care [facility]) CM/SW Contact:  Coralee Pesa, [place] Phone Number: 07/02/2022, 1:36 PM   Clinical Narrative:    Pt to be transported to [facility] via [place]. Nurse to call report to [phone].    Final next level of care: [place] Barriers to Discharge: Barriers Resolved   Patient Goals and CMS Choice      Discharge Placement                Patient chooses bed at:  [facility]) Patient to be transferred to facility by: [place] Name of family member notified: [name] Patient and family notified of of transfer: 07/02/22  Discharge Plan and Services Additional resources added to the After Visit Summary for                                       Social Determinants of Health (SDOH) Interventions SDOH Screenings   Food Insecurity: No Food Insecurity (06/08/2022)  Housing: Low Risk  (06/08/2022)  Transportation Needs: No Transportation Needs (06/08/2022)  Utilities: Not At Risk (06/08/2022)  Depression (PHQ2-9): Low Risk  (10/02/2021)  Tobacco Use: Medium Risk (06/28/2022)     Readmission Risk Interventions    06/10/2022   11:35 AM 09/04/2021   11:18 AM  Readmission Risk Prevention Plan  Transportation Screening Complete Complete  PCP or Specialist Appt within 3-5 Days  Complete  HRI or [place]  Complete  Social Work Consult for [place] Planning/Counseling  Complete  Palliative Care Screening  Not Applicable  Medication Review [employer] photographer) Referral to Pharmacy Complete  HRI or [place] Complete   SW Recovery Care/Counseling Consult Complete   Palliative Care Screening Complete   [place] Not Applicable

## 2022-07-02 NOTE — Progress Notes (Signed)
Attempted to call report to [facility]; no answer and no option to leave message. Pt to be transported via [facility] today. Will call again later. Printed prescriptions and AVS in packet for DC.

## 2022-07-02 NOTE — Progress Notes (Signed)
[facility] Hospital Liaison Note  Met with [name] at bedside to confirm interest in [facility]. Hospice eligibility confirmed. [facility] is able to offer a bed today.  Services explained. Questions answered. [name] agreeable to have patient transferred today.  Please send completed and signed DNR with patient.  RN please call report to [phone].  Please call with any hospice related questions or concerns.  Thank you, [name], BSN, RN [facility] Liaison  [phone]

## 2022-07-02 NOTE — Progress Notes (Signed)
[facility] transported patient to [facility] informed about patients transfer,Iv removed and peri care done

## 2022-07-03 LAB — CULTURE, BLOOD (ROUTINE X 2)
Culture: NO GROWTH
Culture: NO GROWTH
Special Requests: ADEQUATE

## 2022-07-03 NOTE — Progress Notes (Signed)
AVS documentation done

## 2022-08-05 DEATH — deceased

## 2022-08-11 ENCOUNTER — Encounter (HOSPITAL_COMMUNITY): Payer: Self-pay | Admitting: Nephrology

## 2022-08-24 ENCOUNTER — Encounter (HOSPITAL_COMMUNITY): Payer: Self-pay | Admitting: Nephrology

## 2022-08-31 ENCOUNTER — Ambulatory Visit: Payer: Self-pay | Admitting: Podiatry
